# Patient Record
Sex: Male | Born: 1945 | Race: White | Hispanic: No | Marital: Married | Smoking: Never smoker
Health system: Southern US, Community
[De-identification: ages and names within clinical notes are randomized; demographics above are authoritative.]

## PROBLEM LIST (undated history)

## (undated) DIAGNOSIS — E785 Hyperlipidemia, unspecified: Secondary | ICD-10-CM

## (undated) DIAGNOSIS — E538 Deficiency of other specified B group vitamins: Secondary | ICD-10-CM

## (undated) DIAGNOSIS — H40009 Preglaucoma, unspecified, unspecified eye: Secondary | ICD-10-CM

## (undated) DIAGNOSIS — C719 Malignant neoplasm of brain, unspecified: Secondary | ICD-10-CM

## (undated) DIAGNOSIS — I10 Essential (primary) hypertension: Secondary | ICD-10-CM

## (undated) HISTORY — DX: Hyperlipidemia, unspecified: E78.5

## (undated) HISTORY — DX: Essential (primary) hypertension: I10

## (undated) HISTORY — DX: Deficiency of other specified B group vitamins: E53.8

## (undated) HISTORY — DX: Preglaucoma, unspecified, unspecified eye: H40.009

## (undated) HISTORY — PX: NO PAST SURGERIES: SHX2092

---

## 1998-08-20 ENCOUNTER — Encounter: Payer: Self-pay | Admitting: Internal Medicine

## 1998-08-20 ENCOUNTER — Inpatient Hospital Stay (HOSPITAL_COMMUNITY): Admission: EM | Admit: 1998-08-20 | Discharge: 1998-08-22 | Payer: Self-pay | Admitting: Internal Medicine

## 2003-06-06 ENCOUNTER — Encounter: Payer: Self-pay | Admitting: Internal Medicine

## 2003-12-14 ENCOUNTER — Encounter: Payer: Self-pay | Admitting: Internal Medicine

## 2004-08-22 ENCOUNTER — Ambulatory Visit: Payer: Self-pay | Admitting: Internal Medicine

## 2004-09-11 ENCOUNTER — Ambulatory Visit: Payer: Self-pay | Admitting: Internal Medicine

## 2005-05-29 ENCOUNTER — Ambulatory Visit: Payer: Self-pay | Admitting: Internal Medicine

## 2005-06-11 ENCOUNTER — Encounter: Admission: RE | Admit: 2005-06-11 | Discharge: 2005-06-11 | Payer: Self-pay | Admitting: Internal Medicine

## 2006-08-26 ENCOUNTER — Ambulatory Visit: Payer: Self-pay | Admitting: Internal Medicine

## 2006-08-26 LAB — CONVERTED CEMR LAB
Albumin: 4.1 g/dL (ref 3.5–5.2)
Basophils Absolute: 0 10*3/uL (ref 0.0–0.1)
CO2: 27 meq/L (ref 19–32)
Creatinine, Ser: 0.8 mg/dL (ref 0.4–1.5)
Creatinine,U: 116.3 mg/dL
GFR calc non Af Amer: 105 mL/min
Glucose, Bld: 147 mg/dL — ABNORMAL HIGH (ref 70–99)
HDL: 43.1 mg/dL (ref >39.0)
Hgb A1c MFr Bld: 6.1 % — ABNORMAL HIGH (ref 4.6–6.0)
MCHC: 32.6 g/dL (ref 30.0–36.0)
Microalb Creat Ratio: 5.2 mg/g (ref 0.0–30.0)
Microalb, Ur: 0.6 mg/dL (ref 0.0–1.9)
Monocytes Relative: 5.7 % (ref 3.0–11.0)
Neutro Abs: 4.4 10*3/uL (ref 1.4–7.7)
Neutrophils Relative %: 75.4 % (ref 43.0–77.0)
RBC: 5.14 M/uL (ref 4.22–5.81)
RDW: 13.5 % (ref 11.5–14.6)
Sodium: 140 meq/L (ref 135–145)
Total Bilirubin: 0.8 mg/dL (ref 0.3–1.2)
VLDL: 11 mg/dL (ref 0–40)

## 2006-09-11 ENCOUNTER — Ambulatory Visit: Payer: Self-pay | Admitting: Internal Medicine

## 2006-09-11 LAB — CONVERTED CEMR LAB: TSH: 0.23 microintl units/mL — ABNORMAL LOW (ref 0.35–5.50)

## 2006-09-24 ENCOUNTER — Encounter: Admission: RE | Admit: 2006-09-24 | Discharge: 2006-09-24 | Payer: Self-pay | Admitting: Internal Medicine

## 2006-09-24 ENCOUNTER — Encounter: Payer: Self-pay | Admitting: Internal Medicine

## 2006-09-25 ENCOUNTER — Encounter: Payer: Self-pay | Admitting: Internal Medicine

## 2006-10-02 DIAGNOSIS — E782 Mixed hyperlipidemia: Secondary | ICD-10-CM

## 2006-10-02 DIAGNOSIS — E119 Type 2 diabetes mellitus without complications: Secondary | ICD-10-CM | POA: Insufficient documentation

## 2006-10-02 DIAGNOSIS — I10 Essential (primary) hypertension: Secondary | ICD-10-CM | POA: Insufficient documentation

## 2006-10-02 DIAGNOSIS — E785 Hyperlipidemia, unspecified: Secondary | ICD-10-CM | POA: Insufficient documentation

## 2006-12-10 ENCOUNTER — Encounter: Payer: Self-pay | Admitting: Internal Medicine

## 2006-12-10 ENCOUNTER — Ambulatory Visit: Payer: Self-pay | Admitting: Internal Medicine

## 2006-12-10 LAB — CONVERTED CEMR LAB
Hgb A1c MFr Bld: 6.3 % — ABNORMAL HIGH (ref 4.6–6.0)
Microalb Creat Ratio: 10.5 mg/g (ref 0.0–30.0)
Microalb, Ur: 1.2 mg/dL (ref 0.0–1.9)
Potassium: 3.5 meq/L (ref 3.5–5.1)

## 2006-12-29 ENCOUNTER — Ambulatory Visit: Payer: Self-pay | Admitting: Internal Medicine

## 2007-05-13 ENCOUNTER — Ambulatory Visit: Payer: Self-pay | Admitting: Internal Medicine

## 2007-05-14 ENCOUNTER — Encounter (INDEPENDENT_AMBULATORY_CARE_PROVIDER_SITE_OTHER): Payer: Self-pay | Admitting: *Deleted

## 2007-05-14 LAB — CONVERTED CEMR LAB
AST: 19 units/L (ref 0–37)
Cholesterol: 190 mg/dL (ref 0–200)
LDL Cholesterol: 126 mg/dL — ABNORMAL HIGH (ref 0–99)
Microalb, Ur: 0.5 mg/dL (ref 0.0–1.9)
Potassium: 4.9 meq/L (ref 3.5–5.1)
Total CHOL/HDL Ratio: 4.4
Triglycerides: 103 mg/dL (ref 0–149)

## 2007-07-13 ENCOUNTER — Telehealth (INDEPENDENT_AMBULATORY_CARE_PROVIDER_SITE_OTHER): Payer: Self-pay | Admitting: *Deleted

## 2007-08-26 ENCOUNTER — Ambulatory Visit: Payer: Self-pay | Admitting: Internal Medicine

## 2007-08-26 DIAGNOSIS — N400 Enlarged prostate without lower urinary tract symptoms: Secondary | ICD-10-CM | POA: Insufficient documentation

## 2007-08-26 LAB — CONVERTED CEMR LAB
Cholesterol, target level: 200 mg/dL
HDL goal, serum: 40 mg/dL
LDL Goal: 100 mg/dL

## 2007-09-06 ENCOUNTER — Encounter (INDEPENDENT_AMBULATORY_CARE_PROVIDER_SITE_OTHER): Payer: Self-pay | Admitting: *Deleted

## 2007-09-06 LAB — CONVERTED CEMR LAB
Albumin: 4.2 g/dL (ref 3.5–5.2)
Alkaline Phosphatase: 55 units/L (ref 39–117)
BUN: 11 mg/dL (ref 6–23)
Basophils Absolute: 0 10*3/uL (ref 0.0–0.1)
Cholesterol: 180 mg/dL (ref 0–200)
Creatinine,U: 131.3 mg/dL
GFR calc Af Amer: 126 mL/min
HDL: 40.5 mg/dL (ref >39.0)
Hemoglobin: 15.5 g/dL (ref 13.0–17.0)
Lymphocytes Relative: 15.6 % (ref 12.0–46.0)
MCHC: 35.5 g/dL (ref 30.0–36.0)
MCV: 85.3 fL (ref 78.0–100.0)
Microalb Creat Ratio: 3.8 mg/g (ref 0.0–30.0)
Microalb, Ur: 0.5 mg/dL (ref 0.0–1.9)
Monocytes Absolute: 0.4 10*3/uL (ref 0.2–0.7)
Monocytes Relative: 5 % (ref 3.0–11.0)
Neutro Abs: 6.5 10*3/uL (ref 1.4–7.7)
Potassium: 4.2 meq/L (ref 3.5–5.1)
TSH: 1.22 microintl units/mL (ref 0.35–5.50)
Total Protein: 7.3 g/dL (ref 6.0–8.3)

## 2007-09-21 ENCOUNTER — Ambulatory Visit: Payer: Self-pay | Admitting: Internal Medicine

## 2007-09-21 ENCOUNTER — Encounter (INDEPENDENT_AMBULATORY_CARE_PROVIDER_SITE_OTHER): Payer: Self-pay | Admitting: *Deleted

## 2007-10-04 ENCOUNTER — Encounter: Payer: Self-pay | Admitting: Internal Medicine

## 2008-04-21 ENCOUNTER — Telehealth (INDEPENDENT_AMBULATORY_CARE_PROVIDER_SITE_OTHER): Payer: Self-pay | Admitting: *Deleted

## 2008-10-16 ENCOUNTER — Telehealth (INDEPENDENT_AMBULATORY_CARE_PROVIDER_SITE_OTHER): Payer: Self-pay | Admitting: *Deleted

## 2008-10-19 ENCOUNTER — Encounter: Payer: Self-pay | Admitting: Internal Medicine

## 2008-12-14 ENCOUNTER — Telehealth (INDEPENDENT_AMBULATORY_CARE_PROVIDER_SITE_OTHER): Payer: Self-pay | Admitting: *Deleted

## 2009-01-29 ENCOUNTER — Ambulatory Visit: Payer: Self-pay | Admitting: Internal Medicine

## 2009-01-29 DIAGNOSIS — E042 Nontoxic multinodular goiter: Secondary | ICD-10-CM

## 2009-01-29 DIAGNOSIS — D485 Neoplasm of uncertain behavior of skin: Secondary | ICD-10-CM | POA: Insufficient documentation

## 2009-02-03 LAB — CONVERTED CEMR LAB
ALT: 16 units/L (ref 0–53)
AST: 24 units/L (ref 0–37)
Albumin: 3.8 g/dL (ref 3.5–5.2)
Alkaline Phosphatase: 53 units/L (ref 39–117)
Basophils Relative: 0.7 % (ref 0.0–3.0)
Creatinine,U: 116.1 mg/dL
Eosinophils Relative: 1.6 % (ref 0.0–5.0)
GFR calc non Af Amer: 103.87 mL/min (ref >60)
Glucose, Bld: 161 mg/dL — ABNORMAL HIGH (ref 70–99)
HCT: 40 % (ref 39.0–52.0)
Hemoglobin: 13.8 g/dL (ref 13.0–17.0)
Hgb A1c MFr Bld: 7.2 % — ABNORMAL HIGH (ref 4.6–6.5)
Lymphocytes Relative: 19.2 % (ref 12.0–46.0)
Lymphs Abs: 1.2 10*3/uL (ref 0.7–4.0)
Microalb Creat Ratio: 5.2 mg/g (ref 0.0–30.0)
Microalb, Ur: 0.6 mg/dL (ref 0.0–1.9)
Monocytes Relative: 6.9 % (ref 3.0–12.0)
Neutro Abs: 4.3 10*3/uL (ref 1.4–7.7)
PSA: 1.04 ng/mL (ref 0.10–4.00)
Potassium: 4.4 meq/L (ref 3.5–5.1)
RBC: 4.58 M/uL (ref 4.22–5.81)
RDW: 13.6 % (ref 11.5–14.6)
Sodium: 140 meq/L (ref 135–145)
TSH: 1.2 microintl units/mL (ref 0.35–5.50)
Total CHOL/HDL Ratio: 5
Total Protein: 7.4 g/dL (ref 6.0–8.3)
VLDL: 34.8 mg/dL (ref 0.0–40.0)
WBC: 6 10*3/uL (ref 4.5–10.5)

## 2009-02-08 ENCOUNTER — Telehealth (INDEPENDENT_AMBULATORY_CARE_PROVIDER_SITE_OTHER): Payer: Self-pay | Admitting: *Deleted

## 2009-02-08 ENCOUNTER — Encounter (INDEPENDENT_AMBULATORY_CARE_PROVIDER_SITE_OTHER): Payer: Self-pay | Admitting: *Deleted

## 2009-07-16 ENCOUNTER — Telehealth (INDEPENDENT_AMBULATORY_CARE_PROVIDER_SITE_OTHER): Payer: Self-pay | Admitting: *Deleted

## 2009-10-01 ENCOUNTER — Telehealth: Payer: Self-pay | Admitting: Internal Medicine

## 2009-10-31 ENCOUNTER — Telehealth (INDEPENDENT_AMBULATORY_CARE_PROVIDER_SITE_OTHER): Payer: Self-pay | Admitting: *Deleted

## 2009-11-07 ENCOUNTER — Encounter: Payer: Self-pay | Admitting: Internal Medicine

## 2009-11-19 ENCOUNTER — Telehealth (INDEPENDENT_AMBULATORY_CARE_PROVIDER_SITE_OTHER): Payer: Self-pay | Admitting: *Deleted

## 2010-03-29 ENCOUNTER — Telehealth (INDEPENDENT_AMBULATORY_CARE_PROVIDER_SITE_OTHER): Payer: Self-pay | Admitting: *Deleted

## 2010-05-07 ENCOUNTER — Telehealth (INDEPENDENT_AMBULATORY_CARE_PROVIDER_SITE_OTHER): Payer: Self-pay | Admitting: *Deleted

## 2010-05-22 ENCOUNTER — Telehealth (INDEPENDENT_AMBULATORY_CARE_PROVIDER_SITE_OTHER): Payer: Self-pay | Admitting: *Deleted

## 2010-05-27 ENCOUNTER — Telehealth (INDEPENDENT_AMBULATORY_CARE_PROVIDER_SITE_OTHER): Payer: Self-pay | Admitting: *Deleted

## 2010-07-12 ENCOUNTER — Telehealth (INDEPENDENT_AMBULATORY_CARE_PROVIDER_SITE_OTHER): Payer: Self-pay | Admitting: *Deleted

## 2010-09-23 ENCOUNTER — Telehealth: Payer: Self-pay | Admitting: Internal Medicine

## 2010-09-28 ENCOUNTER — Encounter: Payer: Self-pay | Admitting: Internal Medicine

## 2010-10-01 ENCOUNTER — Other Ambulatory Visit: Payer: Self-pay | Admitting: Internal Medicine

## 2010-10-01 ENCOUNTER — Ambulatory Visit
Admission: RE | Admit: 2010-10-01 | Discharge: 2010-10-01 | Payer: Self-pay | Source: Home / Self Care | Attending: Internal Medicine | Admitting: Internal Medicine

## 2010-10-01 ENCOUNTER — Encounter: Payer: Self-pay | Admitting: Internal Medicine

## 2010-10-01 DIAGNOSIS — K029 Dental caries, unspecified: Secondary | ICD-10-CM | POA: Insufficient documentation

## 2010-10-01 DIAGNOSIS — R0989 Other specified symptoms and signs involving the circulatory and respiratory systems: Secondary | ICD-10-CM

## 2010-10-01 LAB — BASIC METABOLIC PANEL
BUN: 19 mg/dL (ref 6–23)
CO2: 28 mEq/L (ref 19–32)
Calcium: 9.2 mg/dL (ref 8.4–10.5)
Chloride: 105 mEq/L (ref 96–112)
Creatinine, Ser: 0.9 mg/dL (ref 0.4–1.5)
GFR: 93.79 mL/min (ref >60.00)
Glucose, Bld: 140 mg/dL — ABNORMAL HIGH (ref 70–99)
Potassium: 4.9 mEq/L (ref 3.5–5.1)
Sodium: 141 mEq/L (ref 135–145)

## 2010-10-01 LAB — TSH: TSH: 1.57 u[IU]/mL (ref 0.35–5.50)

## 2010-10-01 LAB — CBC WITH DIFFERENTIAL/PLATELET
Basophils Absolute: 0 10*3/uL (ref 0.0–0.1)
Basophils Relative: 0.4 % (ref 0.0–3.0)
Eosinophils Absolute: 0.1 10*3/uL (ref 0.0–0.7)
Eosinophils Relative: 0.7 % (ref 0.0–5.0)
HCT: 38.8 % — ABNORMAL LOW (ref 39.0–52.0)
Hemoglobin: 13.1 g/dL (ref 13.0–17.0)
Lymphocytes Relative: 17.3 % (ref 12.0–46.0)
Lymphs Abs: 1.4 10*3/uL (ref 0.7–4.0)
MCHC: 33.6 g/dL (ref 30.0–36.0)
MCV: 89.2 fl (ref 78.0–100.0)
Monocytes Absolute: 0.5 10*3/uL (ref 0.1–1.0)
Monocytes Relative: 6 % (ref 3.0–12.0)
Neutro Abs: 6.3 10*3/uL (ref 1.4–7.7)
Neutrophils Relative %: 75.6 % (ref 43.0–77.0)
Platelets: 192 10*3/uL (ref 150.0–400.0)
RBC: 4.35 Mil/uL (ref 4.22–5.81)
RDW: 15.2 % — ABNORMAL HIGH (ref 11.5–14.6)
WBC: 8.4 10*3/uL (ref 4.5–10.5)

## 2010-10-01 LAB — LIPID PANEL
Cholesterol: 186 mg/dL (ref 0–200)
HDL: 40.6 mg/dL (ref >39.00)
LDL Cholesterol: 113 mg/dL — ABNORMAL HIGH (ref 0–99)
Total CHOL/HDL Ratio: 5
Triglycerides: 163 mg/dL — ABNORMAL HIGH (ref 0.0–149.0)
VLDL: 32.6 mg/dL (ref 0.0–40.0)

## 2010-10-01 LAB — HEPATIC FUNCTION PANEL
AST: 18 U/L (ref 0–37)
Total Bilirubin: 0.6 mg/dL (ref 0.3–1.2)

## 2010-10-01 LAB — MICROALBUMIN / CREATININE URINE RATIO
Creatinine,U: 247 mg/dL
Microalb, Ur: 1.8 mg/dL (ref 0.0–1.9)

## 2010-10-01 LAB — PSA: PSA: 1.03 ng/mL (ref 0.10–4.00)

## 2010-10-04 ENCOUNTER — Telehealth (INDEPENDENT_AMBULATORY_CARE_PROVIDER_SITE_OTHER): Payer: Self-pay | Admitting: *Deleted

## 2010-10-08 NOTE — Progress Notes (Signed)
Summary: lisinopril refill   Phone Note Refill Request Message from:  Fax from Pharmacy on May 22, 2010 11:26 AM  Refills Requested: Medication #1:  LISINOPRIL 20 MG  TABS 1 by mouth two times a day  **APPOINTMENT DUE** [facility] - fax [phone]   Initial call taken by: Okey Regal [place],  May 22, 2010 11:27 AM    Prescriptions: LISINOPRIL 20 MG  TABS (LISINOPRIL) 1 by mouth two times a day  **APPOINTMENT DUE**  #60 x 0   Entered by:   Doristine Devoid CMA   Authorized by:   Marga Melnick MD   Signed by:   Doristine Devoid CMA on 05/23/2010   Method used:   Electronically to        [facility]* (retail)       [address]       [address] [place] [address]mailing       [place], [place]  [address]       Ph: [phone]       Fax: [phone]   RxID:   [id number]

## 2010-10-08 NOTE — Letter (Signed)
Summary: [facility]  [facility]   Imported By: Lanelle Bal 11/16/2009 08:51:50  _____________________________________________________________________  External Attachment:    Type:   Image     Comment:   External Document

## 2010-10-08 NOTE — Progress Notes (Signed)
Summary: med causing stroke, heat attack  Phone Note Call from Patient Call back at [phone]   Caller: Spouse Summary of Call: pt wife left Vm that she just saw on the news where actos is causing Heart attack and stroke. pt wife wants to know if pt needs to continue on this med or does dr hopper want to change this to another med dr hopper pls advise.................Marland KitchenFelecia Deloach CMA  October 01, 2009 3:39 PM   Follow-up for Phone Call        Avandia had been reported to have increased cardiovascular risks, but not Actos to my knowledge. Monitor A1c every 4 months to see if any meds can be decreased Follow-up by: Marga Melnick MD,  October 01, 2009 5:24 PM  Additional Follow-up for Phone Call Additional follow up Details #1::        pt is refusing to take med due to reports that were on TV.  per dr hopper pt needs to have labs repeated in 8 weeks after being off med...........Marland KitchenFelecia Deloach CMA  October 01, 2009 5:28 PM

## 2010-10-08 NOTE — Progress Notes (Signed)
Summary: Refill Request  Phone Note Refill Request Call back at [phone] Message from:  Patient  Refills Requested: Medication #1:  ACTOS 45 MG TABS 1 by mouth once daily Patient would like this RX for Mail Order, mail to [address]/ [place] [address]   Method Requested: Mail to Patient Initial call taken by: Shonna Chock,  November 19, 2009 10:13 AM  Follow-up for Phone Call        Patient's wife aware RX to be mailed Follow-up by: Shonna Chock,  November 19, 2009 10:15 AM    Prescriptions: ACTOS 45 MG TABS (PIOGLITAZONE HCL) 1 by mouth once daily  #90 x 1   Entered by:   Shonna Chock   Authorized by:   Marga Melnick MD   Signed by:   Shonna Chock on 11/19/2009   Method used:   Print then Mail to Patient   RxID:   [id number]

## 2010-10-08 NOTE — Progress Notes (Signed)
Summary: refill request  Phone Note Refill Request Call back at [phone] Message from:  Pharmacy on May 07, 2010 2:29 PM  Refills Requested: Medication #1:  LISINOPRIL 20 MG  TABS 1 by mouth two times a day patient needs office visit   Dosage confirmed as above?Dosage Confirmed   Supply Requested: 1 month   Last Refilled: 02/06/2010 [facility]  Next Appointment Scheduled: none Initial call taken by: Lavell Islam,  May 07, 2010 2:30 PM    New/Updated Medications: LISINOPRIL 20 MG  TABS (LISINOPRIL) 1 by mouth two times a day  **APPOINTMENT DUE** Prescriptions: LISINOPRIL 20 MG  TABS (LISINOPRIL) 1 by mouth two times a day  **APPOINTMENT DUE**  #60 x 0   Entered by:   Shonna Chock CMA   Authorized by:   Marga Melnick MD   Signed by:   Shonna Chock CMA on 05/07/2010   Method used:   Electronically to        [facility]* (retail)       [address]       [address] [place] [address]mailing       [place], [place]  [address]       Ph: [phone]       Fax: [phone]   RxID:   [id number]

## 2010-10-08 NOTE — Progress Notes (Signed)
Summary: refill  Phone Note Call from Patient Call back at [phone]   Caller: Patient Summary of Call: pt wife left VM that she is try to get mail order rx filled but is having difficulty doing so. pt only has a few pills  left of his actos. pt would like to get a 30-day supply sent in to [facility]......................Marland KitchenFelecia Deloach CMA  March 29, 2010 9:23 AM     Prescriptions: ACTOS 45 MG TABS (PIOGLITAZONE HCL) 1 by mouth once daily  #30 x 0   Entered by:   Shonna Chock CMA   Authorized by:   Marga Melnick MD   Signed by:   Shonna Chock CMA on 03/29/2010   Method used:   Electronically to        [facility]* (retail)       [address]       [address] [place] [address]mailing       [place], [place]  [address]       Ph: [phone]       Fax: [phone]   RxID:   [id number]

## 2010-10-08 NOTE — Progress Notes (Signed)
Summary: metoprolol refill   Phone Note Refill Request Message from:  Patient  Refills Requested: Medication #1:  METOPROLOL TARTRATE 50 MG  TABS take 1 tab bid Initial call taken by: Doristine Devoid CMA,  May 27, 2010 11:05 AM    Prescriptions: METOPROLOL TARTRATE 50 MG  TABS (METOPROLOL TARTRATE) take 1 tab bid  #60 x 0   Entered by:   Doristine Devoid CMA   Authorized by:   Marga Melnick MD   Signed by:   Doristine Devoid CMA on 05/27/2010   Method used:   Electronically to        [facility]* (retail)       [address]       [address] [place] [address]mailing       [place], [place]  [address]       Ph: [phone]       Fax: [phone]   RxID:   [id number]

## 2010-10-08 NOTE — Progress Notes (Signed)
Summary: Refill Request  Phone Note Refill Request Message from:  Patient  Refills Requested: Medication #1:  ACTOS 45 MG TABS 1 by mouth once daily  Medication #2:  METOPROLOL TARTRATE 50 MG  TABS take 1 tab bid  Medication #3:  CLONIDINE HCL 0.2 MG  TABS (CLONIDINE HCL) 1 by mouth two times a day needs follow up on blood pressure  Medication #4:  METFORMIN HCL 1000 MG TABS 1 by mouth bid [facility]   Method Requested: Fax to Local Pharmacy Initial call taken by: Shonna Chock,  October 31, 2009 11:21 AM    Prescriptions: ACTOS 45 MG TABS (PIOGLITAZONE HCL) 1 by mouth once daily  #90 x 1   Entered by:   Shonna Chock   Authorized by:   Marga Melnick MD   Signed by:   Shonna Chock on 10/31/2009   Method used:   Electronically to        [facility]* (retail)       [address]       [address] [place] [address]mailing       [place], [place]  [address]       Ph: [phone]       Fax: [phone]   RxID:   [id number] METFORMIN HCL 1000 MG TABS (METFORMIN HCL) 1 by mouth bid  #180 Each x 1   Entered by:   Shonna Chock   Authorized by:   Marga Melnick MD   Signed by:   Shonna Chock on 10/31/2009   Method used:   Electronically to        [facility]* (retail)       [address]       [address] Shipping/mailing       [place], [place]  [address]       Ph: [phone]       Fax: [phone]   RxID:   [id number] LISINOPRIL 20 MG  TABS (LISINOPRIL) 1 by mouth two times a day patient needs office visit  #180 Each x 1   Entered by:   Shonna Chock   Authorized by:   Marga Melnick MD   Signed by:   Shonna Chock on 10/31/2009   Method used:   Electronically to        [facility]* (retail)       [address]       [address] Shipping/mailing       [place], [place]  [address]       Ph: [phone]       Fax: [phone]   RxID:   [id number] CLONIDINE HCL 0.2 MG  TABS  (CLONIDINE HCL) 1 by mouth two times a day needs follow up on blood pressure  #180 x 1   Entered by:   Shonna Chock   Authorized by:   Marga Melnick MD   Signed by:   Shonna Chock on 10/31/2009   Method used:   Faxed to ...       [facility]* (retail)       [address]       [address] Shipping/mailing       [place], [place]  [address]       Ph: [phone]       Fax: [phone]   RxID:   [id number] METOPROLOL TARTRATE 50 MG  TABS (METOPROLOL TARTRATE) take 1 tab bid  #180  Each x 1   Entered by:   Shonna Chock   Authorized by:   Marga Melnick MD   Signed by:   Shonna Chock on 10/31/2009   Method used:   Electronically to        [facility]* (retail)       [address]       [address] [place] [address]mailing       [place], [place]  [address]       Ph: [phone]       Fax: [phone]   RxID:   [id number]

## 2010-10-08 NOTE — Progress Notes (Signed)
Summary: Med Concerns   Phone Note Call from Patient Call back at Home Phone [phone]   Caller: [facility] Summary of Call: Med concerns: Patient needs refill on Lisinopril, patient unable to get off for the next 60 days at a new Job   Shonna Chock CMA  July 12, 2010 1:08 PM   Follow-up for Phone Call        Spoke with patient's wife and scheduled appointment for Jan 2012 and informed her we will refill med to last until appointment  Follow-up by: Shonna Chock CMA,  July 12, 2010 1:21 PM    Prescriptions: LISINOPRIL 20 MG  TABS (LISINOPRIL) 1 by mouth two times a day  **APPOINTMENT DUE**  #60 x 2   Entered by:   Shonna Chock CMA   Authorized by:   Marga Melnick MD   Signed by:   Shonna Chock CMA on 07/12/2010   Method used:   Electronically to        [facility]* (retail)       [address]       [address] [place] [address]mailing       [place], [place]  [address]       Ph: [phone]       Fax: [phone]   RxID:   [id number]   Appended Document: Med Concerns     Prescriptions: METFORMIN HCL 1000 MG TABS (METFORMIN HCL) 1 by mouth two times a day **APPOINTMENT OVERDUE**  #60 x 2   Entered by:   Shonna Chock CMA   Authorized by:   Marga Melnick MD   Signed by:   Shonna Chock CMA on 07/12/2010   Method used:   Electronically to        [facility]* (retail)       [address]       [address] [place] [address]mailing       [place], [place]  [address]       Ph: [phone]       Fax: [phone]   RxID:   [id number]

## 2010-10-10 NOTE — Progress Notes (Signed)
Summary: Refill  Phone Note Refill Request Call back at [phone] Message from:  [facility] on September 23, 2010 11:45 AM  Refills Requested: Medication #1:  CLONIDINE HCL 0.2 MG TABS 1 by mouth two times a day needs follow up on blood pressure Patient has upcomin appt and will run out prior. Please send to [facility] out patient pharmacy. How many would you like to give the patient?  Next Appointment Scheduled: 1.24.12 Initial call taken by: Lucious Groves CMA,  September 23, 2010 11:45 AM  Follow-up for Phone Call        refill  to get him through  OV ; the Rx may have to be changed based on status @  that visit Follow-up by: Marga Melnick MD,  September 23, 2010 2:19 PM  Additional Follow-up for Phone Call Additional follow up Details #1::        Patient spouse notified. Additional Follow-up by: Lucious Groves CMA,  September 23, 2010 3:38 PM    Prescriptions: CLONIDINE HCL 0.2 MG TABS (CLONIDINE HCL) 1 by mouth two times a day needs follow up on blood pressure  #16 x 0   Entered by:   Lucious Groves CMA   Authorized by:   Marga Melnick MD   Signed by:   Lucious Groves CMA on 09/23/2010   Method used:   Electronically to        [facility]* (retail)       [address]       [address] [place] [address]mailing       [place], [place]  [address]       Ph: [phone]       Fax: [phone]   RxID:   [id number]

## 2010-10-10 NOTE — Assessment & Plan Note (Signed)
Summary: CPX with fasting/scm   Vital Signs:  Patient profile:   65 year old male Height:      72 inches Weight:      224.4 pounds BMI:     30.54 Temp:     97.9 degrees F oral Pulse rate:   72 / minute Resp:     14 per minute BP sitting:   130 / 88  (left arm) Cuff size:   large  Vitals Entered By: Shonna Chock CMA (October 01, 2010 8:26 AM) CC: 1.) CPX with fasting labs  2.) Discuss Metoprolol, Type 2 diabetes mellitus follow-up   CC:  1.) CPX with fasting labs  2.) Discuss Metoprolol and Type 2 diabetes mellitus follow-up.  History of Present Illness:    [facility] is here for a physical; he feels Metoprolol makes him sleepy.  The patient denies lightheadedness, urinary frequency, headaches, edema, and fatigue.  The patient denies the following associated symptoms: chest pain, chest pressure, exercise intolerance, dyspnea, palpitations, and syncope.  Compliance with medications (by patient report) has been near 100%.  The patient reports that dietary compliance has been  fair to good.  The patient reports exercising occasionally.  Adjunctive measures currently used by the patient include salt restriction.  BP @ home 120-150/ 86-89.    Type 2 Diabetes Mellitus Follow-Up:  The patient reports weight gain of 10#, but denies polyuria, polydipsia, blurred vision, self managed hypoglycemia, and numbness of extremities.  The patient denies the following symptoms: neuropathic pain, poor wound healing, intermittent claudication, vision loss, and foot ulcer.  The patient has been measuring capillary blood glucose before breakfast,( 140-180s) and before dinner( 120-140).  Since the last visit, the patient reports having had eye care by an ophthalmologist.    Current Medications (verified): 1)  Metoprolol Tartrate 50 Mg  Tabs (Metoprolol Tartrate) .... Take 1 Tab Two Times A Day **appointment Due** 2)  Clonidine Hcl 0.2 Mg Tabs (Clonidine Hcl) .Marland Kitchen.. 1 By Mouth Two Times A Day Needs Follow Up On Blood  Pressure 3)  Lisinopril 20 Mg  Tabs (Lisinopril) .Marland Kitchen.. 1 By Mouth Two Times A Day  **appointment Due** 4)  Metformin Hcl 1000 Mg Tabs (Metformin Hcl) .Marland Kitchen.. 1 By Mouth Two Times A Day **appointment Overdue** 5)  Onetouch Ultra Test   Strp (Glucose Blood) .Marland Kitchen.. 1 Strip Daily As Directed 6)  Asa 81mg  .... Once Daily 7)  Muscadine Grape Seed .... Bid 8)  Multivitamin .Marland Kitchen.. 1 By Mouth Qd 9)  Calcium 1000mg  With Vit D 400 Iu .... Qd 10)  Fish Oil 1000mg  .... Bid 11)  Actos 45 Mg Tabs (Pioglitazone Hcl) .Marland Kitchen.. 1 By Mouth Once Daily 12)  Vitamin C 500 Mg Tabs (Ascorbic Acid) .Marland Kitchen.. 1 By Mouth Once Daily 13)  Vitamin D 2000 Unit Tabs (Cholecalciferol) .Marland Kitchen.. 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 as per [place] & thyroid scan  Past Surgical History: Colitis/dehydration 1999 Denies surgical history Colonoscopy : none, "I just don't want one; you ask me that every year" ( SOC reviewed)  Family History: maternal uncle: diabetes brother: MI died age 79 mother: cancer?primary  Social History: Never Smoked Alcohol use-no Occupation: Technical sales engineer, part time Married Regular exercise-no  Review of Systems  The patient denies anorexia, fever, hoarseness, prolonged cough, hemoptysis, abdominal pain, melena, hematochezia,  severe indigestion/heartburn, hematuria, suspicious skin lesions, depression, unusual weight change, abnormal bleeding, enlarged lymph nodes, and angioedema.    Physical Exam  General:  well-nourished; alert,appropriate and cooperative throughout examination Head:  Normocephalic and atraumatic without obvious abnormalities. No apparent alopecia  Eyes:  No corneal or conjunctival inflammation noted.Perrla. Funduscopic exam benign,  without hemorrhages, exudates or papilledema.  Ears:  External ear exam shows no significant lesions or deformities.  Otoscopic examination reveals clear canals, tympanic membranes are intact bilaterally without bulging, retraction, inflammation or discharge. Hearing is grossly normal bilaterally. Nose:  External nasal examination shows no deformity or inflammation. Nasal mucosa are pink and moist without lesions or exudates. Mouth:  Oral mucosa and oropharynx without lesions or exudates.  Teeth  with clical caries Neck:  No deformities, masses, or tenderness noted. Prominent hyoid Lungs:  Normal respiratory effort, chest expands symmetrically. Lungs are clear to auscultation, no crackles or wheezes. Heart:  Normal rate and regular rhythm. S1 and S2 normal without gallop, murmur, click, rub .S4 Abdomen:  Bowel sounds positive,abdomen soft and non-tender without masses, organomegaly or hernias noted. Rectal:  No external abnormalities noted. Normal sphincter tone. No rectal masses or tenderness. Genitalia:  Testes bilaterally descended without nodularity, tenderness or masses. No scrotal masses or lesions. No penis lesions or urethral discharge. Prostate:  Prostate gland firm and smooth, ULN w/o  nodularity, tenderness, mass, asymmetry or induration. Msk:  No deformity or scoliosis noted of thoracic or lumbar spine.   Pulses:  R and L carotid,radial pulses are full and equal bilaterally. Decreased pedal pulses. Faint R carotid bruit Extremities:  No clubbing, cyanosis, edema. Fungal toe nail changes Neurologic:  alert & oriented X3, sensation intact to light touch, and DTRs symmetrical and normal.   Skin:  Intact without suspicious lesions or rashes. Myriad tiny cherry angiomas Cervical Nodes:  No lymphadenopathy noted Axillary Nodes:  No palpable lymphadenopathy Inguinal Nodes:  No significant adenopathy Psych:  memory intact for recent and remote, normally interactive, and good eye contact.      Impression & Recommendations:  Problem # 1:  ROUTINE GENERAL MEDICAL EXAM@HEALTH  CARE FACL (ICD-V70.0)  Orders: EKG w/ Interpretation (93000) Venipuncture (04540) TLB-Lipid Panel (80061-LIPID) TLB-BMP (Basic Metabolic Panel-BMET) (80048-METABOL) TLB-CBC Platelet - w/Differential (85025-CBCD) TLB-Hepatic/Liver Function Pnl (80076-HEPATIC) TLB-TSH (Thyroid Stimulating Hormone) (84443-TSH) TLB-PSA (Prostate Specific Antigen) (84153-PSA) TLB-A1C / Hgb A1C (Glycohemoglobin) (83036-A1C) TLB-Microalbumin/Creat Ratio, Urine (82043-MALB)  Problem # 2:  DIABETES MELLITUS, TYPE II (ICD-250.00)  His updated medication list for this problem includes:    Lisinopril 20 Mg Tabs (Lisinopril) .Marland Kitchen... 1 by mouth two times a day  **appointment due**    Metformin Hcl 1000 Mg Tabs (Metformin hcl) .Marland Kitchen... 1 by mouth two times a day **appointment overdue**    Actos 45 Mg Tabs (Pioglitazone hcl) .Marland Kitchen... 1 by mouth once daily  Problem # 3:  HYPERLIPIDEMIA (ICD-272.4)  Problem # 4:  HYPERTENSION (ICD-401.9)  The following medications were removed from the medication list:    Metoprolol Tartrate 50 Mg Tabs (Metoprolol tartrate) .Marland Kitchen... Take 1 tab two times a day **appointment due** His updated medication list for this problem includes:    Clonidine Hcl 0.2 Mg Tabs (Clonidine hcl) .Marland Kitchen... 1 by mouth two times a day needs follow up on blood pressure    Lisinopril 20 Mg Tabs (Lisinopril) .Marland Kitchen... 1 by mouth two times a day  **appointment due**    Carvedilol 12.5 Mg Tabs (Carvedilol) .Marland Kitchen... 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 1 by mouth two times a day needs follow up on blood pressure 2)  Lisinopril 20 Mg Tabs (Lisinopril) .Marland Kitchen.. 1 by mouth two times a day   **appointment due** 3)  Metformin Hcl 1000 Mg Tabs (Metformin hcl) .Marland Kitchen.. 1 by mouth two times a day **appointment overdue** 4)  Onetouch Ultra Test Strp (Glucose blood) .Marland Kitchen.. 1 strip daily as directed 5)  Asa 81mg   .... Once daily 6)  Muscadine Grape Seed  .... Bid 7)  Multivitamin  .Marland Kitchen.. 1 by mouth qd 8)  Calcium 1000mg  With Vit D 400 Iu  .... Qd 9)  Fish Oil 1000mg   .... Bid 10)  Actos 45 Mg Tabs (Pioglitazone hcl) .Marland Kitchen.. 1 by mouth once daily 11)  Vitamin C 500 Mg Tabs (Ascorbic acid) .Marland Kitchen.. 1 by mouth once daily 12)  Vitamin D 2000 Unit Tabs (Cholecalciferol) .Marland Kitchen.. 1 by mouth once daily 13)  Carvedilol 12.5 Mg Tabs (Carvedilol) .Marland Kitchen.. 1 two times a day  Patient Instructions: 1)  It is important that you exercise regularly at least 20 minutes 5 times a week. If you develop chest pain, have severe difficulty breathing, or feel very tired , stop exercising immediately and seek medical attention. 2)   Consider  a colonoscopy  to help detect colon cancer. 3)  Take an 81 mg coated Aspirin every day. 4)  Check your blood sugars regularly. If your readings are usually above :150  or below 90 you should contact our office. 5)  See your eye doctor yearly to check for diabetic eye damage. 6)  Check your feet each night for sore areas, calluses or signs of infection.Consider Podiatry referral. Prescriptions: CARVEDILOL 12.5 MG TABS (CARVEDILOL) 1 two times a day  #60 x 2   Entered and Authorized by:   Marga Melnick MD   Signed by:   Marga Melnick MD on 10/01/2010   Method used:   Electronically to        [facility]* (retail)       [address]       [address] Shipping/mailing       [place], [place]  [address]       Ph: [phone]       Fax: [phone]   RxID:   [id number]    Orders Added: 1)  Est. Patient 40-64 years [99396] 2)  EKG w/ Interpretation [93000] 3)  Venipuncture [36415] 4)  TLB-Lipid Panel [80061-LIPID] 5)  TLB-BMP (Basic Metabolic Panel-BMET)  [80048-METABOL] 6)  TLB-CBC Platelet - w/Differential [85025-CBCD] 7)  TLB-Hepatic/Liver Function Pnl [80076-HEPATIC] 8)  TLB-TSH (Thyroid Stimulating Hormone) [84443-TSH] 9)  TLB-PSA (Prostate Specific Antigen) [84153-PSA] 10)  TLB-A1C / Hgb A1C (Glycohemoglobin) [83036-A1C] 11)  TLB-Microalbumin/Creat Ratio, Urine [82043-MALB] 12)  Doppler Referral [Doppler]

## 2010-10-10 NOTE — Progress Notes (Signed)
Summary: Results/rx  Phone Note Call from Patient   Caller: Fredonia Highland  [phone] Summary of Call: Patient spouse called noting that patient was waiting on results and would like to know if he should continue Clonidine. If so prescription needs to be sent to [facility] out patient pharmacy. Please advise. Lucious Groves CMA  October 04, 2010 9:35 AM   Follow-up for Phone Call        done ; Actos changed to Januvia. Anemia will need   F/U in 10 weeks; labs wil be mailed Follow-up by: Marga Melnick MD,  October 04, 2010 4:36 PM  Additional Follow-up for Phone Call Additional follow up Details #1::        Spoke with patient's wife reguarding labs, patient's wife ok'd and aware copy to be mailed. Patient to call once he starts [place] to schedule 10 week lab f/u Additional Follow-up by: Shonna Chock CMA,  October 04, 2010 4:49 PM    New/Updated Medications: CLONIDINE HCL 0.2 MG TABS (CLONIDINE HCL) 1 by mouth two times a day JANUVIA 100 MG TABS (SITAGLIPTIN PHOSPHATE) 1 once daily (in place of Actos) Prescriptions: CLONIDINE HCL 0.2 MG TABS (CLONIDINE HCL) 1 by mouth two times a day  #180 x 3   Entered and Authorized by:   Marga Melnick MD   Signed by:   Marga Melnick MD on 10/04/2010   Method used:   Electronically to        [facility]* (retail)       [address]       [address] Shipping/mailing       [place], [place]  [address]       Ph: [phone]       Fax: [phone]   RxID:   [id number] JANUVIA 100 MG TABS (SITAGLIPTIN PHOSPHATE) 1 once daily (in place of Actos)  #90 x 0   Entered and Authorized by:   Marga Melnick MD   Signed by:   Marga Melnick MD on 10/04/2010   Method used:   Electronically to        [facility]* (retail)       [address]       [address] [place] [address]mailing       [place], [place]  [address]       Ph: [phone]       Fax: [phone]   RxID:   [id number]

## 2010-10-11 ENCOUNTER — Telehealth: Payer: Self-pay | Admitting: Internal Medicine

## 2010-10-14 ENCOUNTER — Telehealth: Payer: Self-pay | Admitting: Internal Medicine

## 2010-10-15 ENCOUNTER — Ambulatory Visit
Admission: RE | Admit: 2010-10-15 | Discharge: 2010-10-15 | Disposition: A | Discharge status: Home / Self Care | Payer: PRIVATE HEALTH INSURANCE | Source: Ambulatory Visit | Attending: Internal Medicine | Admitting: Internal Medicine

## 2010-10-15 DIAGNOSIS — R0989 Other specified symptoms and signs involving the circulatory and respiratory systems: Secondary | ICD-10-CM

## 2010-10-16 NOTE — Progress Notes (Signed)
Summary: Lisinopril  Phone Note Call from Patient   CallerFredonia Highland  [phone] Summary of Call: Patient spouse left message on triage wondering if the patient is to continue Lisinopril. If so he needs prescription sent to [facility] out patient pharmacy. Please advise. Initial call taken by: Lucious Groves CMA,  October 11, 2010 1:09 PM  Follow-up for Phone Call        refill X 12 months Follow-up by: Marga Melnick MD,  October 11, 2010 1:22 PM  Additional Follow-up for Phone Call Additional follow up Details #1::        Left message on voicemail notifying patient spouse. Lucious Groves CMA  October 11, 2010 3:00 PM     New/Updated Medications: LISINOPRIL 20 MG  TABS (LISINOPRIL) 1 by mouth two times a day Prescriptions: LISINOPRIL 20 MG  TABS (LISINOPRIL) 1 by mouth two times a day  #60 x 11   Entered by:   Lucious Groves CMA   Authorized by:   Marga Melnick MD   Signed by:   Lucious Groves CMA on 10/11/2010   Method used:   Electronically to        [facility]* (retail)       [address]       [address] [place] [address]/mailing       [place], [place]  [address]       Ph: [phone]       Fax: [phone]   RxID:   [id number]

## 2010-10-24 NOTE — Progress Notes (Signed)
Summary: Refill--Metformin  Phone Note Refill Request Message from:  Patient on October 14, 2010 9:20 AM  Refills Requested: Medication #1:  METFORMIN HCL 1000 MG TABS 1 by mouth two times a day **APPOINTMENT OVERDUE** Initial call taken by: Lucious Groves CMA,  October 14, 2010 9:20 AM    New/Updated Medications: METFORMIN HCL 1000 MG TABS (METFORMIN HCL) 1 by mouth two times a day Prescriptions: METFORMIN HCL 1000 MG TABS (METFORMIN HCL) 1 by mouth two times a day  #60 x 3   Entered by:   Lucious Groves CMA   Authorized by:   Marga Melnick MD   Signed by:   Lucious Groves CMA on 10/14/2010   Method used:   Electronically to        [facility]* (retail)       [address]       [address] [place] [address]mailing       [place], [place]  [address]       Ph: [phone]       Fax: [phone]   RxID:   [id number]

## 2010-11-14 ENCOUNTER — Encounter: Payer: Self-pay | Admitting: Internal Medicine

## 2010-11-26 NOTE — Letter (Signed)
Summary: Eye Exam/[facility]  Eye Exam/[facility]   Imported By: Maryln Gottron 11/21/2010 10:01:41  _____________________________________________________________________  External Attachment:    Type:   Image     Comment:   External Document

## 2010-12-30 ENCOUNTER — Other Ambulatory Visit: Payer: Self-pay | Admitting: Internal Medicine

## 2011-01-09 ENCOUNTER — Other Ambulatory Visit: Payer: Self-pay | Admitting: Internal Medicine

## 2011-01-09 NOTE — Telephone Encounter (Signed)
please recheck CBC with iron panel,B12, & folate (  285.9) in 3 months with repeat A1c , BUN , creat, K+, Lipids ( 250.02, 272.4,995.20). Complete stool cards please. Hopp. Copied from 10/01/2010 lab append

## 2011-02-05 ENCOUNTER — Other Ambulatory Visit: Payer: Self-pay | Admitting: Internal Medicine

## 2011-02-05 MED ORDER — SITAGLIPTIN PHOSPHATE 100 MG PO TABS: 100.0000 mg | ORAL_TABLET | Freq: Every day | ORAL | Status: DC

## 2011-02-05 MED ORDER — METFORMIN HCL 1000 MG PO TABS: 1000.0000 mg | ORAL_TABLET | Freq: Two times a day (BID) | ORAL | Status: DC

## 2011-02-05 NOTE — Telephone Encounter (Signed)
Patient is due for labs, please call patient to scheduled labs were due 12/2010  please recheck CBC with iron panel,B12, & folate (  285.9) in 3 months with repeat A1c , BUN , creat, K+, Lipids ( 250.02, 272.4,995.20). Complete stool cards please. Hopp.  OK to sign encounter once appointment scheduled

## 2011-02-07 NOTE — Telephone Encounter (Signed)
LMOM to call and schedule lab appt

## 2011-03-04 ENCOUNTER — Other Ambulatory Visit: Payer: Self-pay | Admitting: Internal Medicine

## 2011-03-04 DIAGNOSIS — T887XXA Unspecified adverse effect of drug or medicament, initial encounter: Secondary | ICD-10-CM

## 2011-03-04 DIAGNOSIS — D649 Anemia, unspecified: Secondary | ICD-10-CM

## 2011-03-04 DIAGNOSIS — E785 Hyperlipidemia, unspecified: Secondary | ICD-10-CM

## 2011-03-05 ENCOUNTER — Other Ambulatory Visit (INDEPENDENT_AMBULATORY_CARE_PROVIDER_SITE_OTHER): Payer: PRIVATE HEALTH INSURANCE

## 2011-03-05 DIAGNOSIS — E785 Hyperlipidemia, unspecified: Secondary | ICD-10-CM

## 2011-03-05 DIAGNOSIS — D649 Anemia, unspecified: Secondary | ICD-10-CM

## 2011-03-05 DIAGNOSIS — T887XXA Unspecified adverse effect of drug or medicament, initial encounter: Secondary | ICD-10-CM

## 2011-03-05 LAB — CBC WITH DIFFERENTIAL/PLATELET
Basophils Absolute: 0 10*3/uL (ref 0.0–0.1)
Lymphocytes Relative: 19 % (ref 12.0–46.0)
Monocytes Relative: 5.6 % (ref 3.0–12.0)
Platelets: 179 10*3/uL (ref 150.0–400.0)
RDW: 14.4 % (ref 11.5–14.6)

## 2011-03-05 LAB — LIPID PANEL
Cholesterol: 172 mg/dL (ref 0–200)
LDL Cholesterol: 117 mg/dL — ABNORMAL HIGH (ref 0–99)
Triglycerides: 91 mg/dL (ref 0.0–149.0)
VLDL: 18.2 mg/dL (ref 0.0–40.0)

## 2011-03-05 LAB — BUN: BUN: 18 mg/dL (ref 6–23)

## 2011-03-05 LAB — IRON AND TIBC
%SAT: 24 % (ref 20–55)
Iron: 78 ug/dL (ref 42–165)
TIBC: 331 ug/dL (ref 215–435)
UIBC: 253 ug/dL

## 2011-03-05 NOTE — Telephone Encounter (Signed)
Lab appt for 6/27

## 2011-03-05 NOTE — Progress Notes (Signed)
Labs only

## 2011-03-13 ENCOUNTER — Other Ambulatory Visit: Payer: Self-pay | Admitting: Internal Medicine

## 2011-03-13 MED ORDER — METFORMIN HCL 1000 MG PO TABS: 1000.0000 mg | ORAL_TABLET | Freq: Two times a day (BID) | ORAL | Status: DC

## 2011-03-13 MED ORDER — SITAGLIPTIN PHOSPHATE 100 MG PO TABS: 100.0000 mg | ORAL_TABLET | Freq: Every day | ORAL | Status: DC

## 2011-03-13 NOTE — Telephone Encounter (Signed)
RX's sent to the pharmacy, pending appointment this month 03/2011 (additional refills to be given then)

## 2011-03-22 ENCOUNTER — Encounter: Payer: Self-pay | Admitting: Internal Medicine

## 2011-03-27 ENCOUNTER — Ambulatory Visit (INDEPENDENT_AMBULATORY_CARE_PROVIDER_SITE_OTHER): Payer: PRIVATE HEALTH INSURANCE | Admitting: Internal Medicine

## 2011-03-27 ENCOUNTER — Encounter: Payer: Self-pay | Admitting: Internal Medicine

## 2011-03-27 VITALS — BP 128/84 | HR 83 | Wt 218.2 lb

## 2011-03-27 DIAGNOSIS — I10 Essential (primary) hypertension: Secondary | ICD-10-CM

## 2011-03-27 DIAGNOSIS — E785 Hyperlipidemia, unspecified: Secondary | ICD-10-CM

## 2011-03-27 DIAGNOSIS — D518 Other vitamin B12 deficiency anemias: Secondary | ICD-10-CM

## 2011-03-27 DIAGNOSIS — D519 Vitamin B12 deficiency anemia, unspecified: Secondary | ICD-10-CM

## 2011-03-27 DIAGNOSIS — E119 Type 2 diabetes mellitus without complications: Secondary | ICD-10-CM

## 2011-03-27 MED ORDER — CYANOCOBALAMIN 1000 MCG/ML IJ SOLN
1000.0000 ug | Freq: Once | INTRAMUSCULAR | Status: AC
  Administered 2011-03-27: 1000 ug via INTRAMUSCULAR

## 2011-03-27 MED ORDER — CYANOCOBALAMIN 500 MCG/0.1ML NA SOLN: 500.0000 ug | NASAL | Status: DC

## 2011-03-27 NOTE — Patient Instructions (Addendum)
Follow the low carb nutrition program in [employer] [place] [employer] [place] as closely as possible to prevent Diabetes progression & complications. White carbohydrates (potatoes, rice, bread, and pasta) have a high spike of sugar and a high load of sugar. For example a  baked potato has a cup of sugar and a  french fry  2 teaspoons of sugar. Yams, wild  rice, whole grained bread &  wheat pasta have been much lower spike and load of  sugar.  Please  schedule fasting Labs : Lipids; A1c urine microalbumin. Preventive Health Care: Exercise at least 30-45 minutes a day,  3-4 days a week.  Eye Doctor - have an eye exam @ least annually.                                                      Consider Podiatry referral

## 2011-03-27 NOTE — Progress Notes (Signed)
  Subjective:    Patient ID: Alex Church, male    DOB: 17-Oct-1945, 65 y.o.   MRN: [id number]  HPI#1  HYPERTENSION: Disease Monitoring: Blood pressure range-130s/ 80  Chest pain, palpitations- no       Dyspnea- no Medications: Compliance- yes  Lightheadedness,Syncope- no    Edema- no  #2 DIABETES: Disease Monitoring: Blood Sugar ranges-150s in am  Polyuria/phagia/dipsia- no       Visual problems- no Medications: Compliance- yes  Hypoglycemic symptoms- no Nutrition: no program Eye exam is up to date; he has had no care by podiatrist   #3 HYPERLIPIDEMIA: Disease Monitoring: See symptoms for Hypertension Medications: Compliance:Not on statin  Abd pain, bowel changes: occasional loose stool  Muscle aches:no  ROS See HPI above   PMH Smoking Status noted ;never  Current labs and labs dating back to December 2008 were reviewed. His LDL has been less than 120 and stable. His A1c has climbed from a value of 6.14 August 2007 to 7.3.  His B12 level is low at 162 there is no anemia and MCV is normal       Review of Systems     Objective:   Physical Exam Gen.: Healthy and well-nourished in appearance. Alert, appropriate and cooperative throughout exam. Eyes: No corneal or conjunctival inflammation noted.  Neck: No deformities, masses, or tenderness noted. Thyroid normal. Lungs: Normal respiratory effort; chest expands symmetrically. Lungs are clear to auscultation without rales, wheezes, or increased work of breathing. Heart: Normal rate and rhythm. Normal S1 and S2. No gallop, click, or rub. S4 w/o  murmur.                                                                                    Musculoskeletal/extremities: No clubbing, cyanosis, edema, or deformity noted. Joints normal. Nail health fair; thickening & discoloration Vascular: Carotid, radial artery, dorsalis pedis and  posterior tibial pulses are full and equal. No bruits present. Neurologic: Alert and  oriented x3. Deep tendon reflexes symmetrical and normal. Light touch normal over feet.          Skin: Intact without suspicious lesions or rashes. Psych: Mood and affect are normal. Normally interactive                                                                                         Assessment & Plan:  #1 diabetes, suboptimal control  #2 dyslipidemia, mild elevation of the LDL  #3 hypertension, controlled  #4 low B12 level without anemia or elevation of the MCV  Plan: Referral will be made to nutritionist  rather than increasing diabetic medications  Nascobal l will be initiated with followup B12 level in 6 months.

## 2011-04-02 ENCOUNTER — Telehealth: Payer: Self-pay | Admitting: Internal Medicine

## 2011-04-02 NOTE — Telephone Encounter (Signed)
Pt wife called to get lab results would like to know what could be causing low b12 levels and wants to know should he continue w/ the shots or start the nascobal

## 2011-04-02 NOTE — Telephone Encounter (Signed)
Left msg on voicemail to machine

## 2011-04-02 NOTE — Telephone Encounter (Signed)
Probably the best option would be to continue shots  Or Nascobal  ( if that agent is  not cost prohibitive)& recheck B12 level in 4 months in spite of the last test result. B12 level should be more reliable than the other test

## 2011-04-03 NOTE — Telephone Encounter (Signed)
Spoke w/ pt aware of information.

## 2011-04-14 ENCOUNTER — Other Ambulatory Visit: Payer: Self-pay | Admitting: Internal Medicine

## 2011-04-15 MED ORDER — SITAGLIPTIN PHOSPHATE 100 MG PO TABS: 100.0000 mg | ORAL_TABLET | Freq: Every day | ORAL | Status: DC

## 2011-04-15 MED ORDER — METFORMIN HCL 1000 MG PO TABS: 1000.0000 mg | ORAL_TABLET | Freq: Two times a day (BID) | ORAL | Status: DC

## 2011-04-15 NOTE — Telephone Encounter (Signed)
RXs sent to the pharmacy.

## 2011-04-24 ENCOUNTER — Encounter: Payer: Self-pay | Admitting: *Deleted

## 2011-04-24 ENCOUNTER — Encounter: Payer: PRIVATE HEALTH INSURANCE | Attending: Internal Medicine | Admitting: *Deleted

## 2011-04-24 DIAGNOSIS — Z713 Dietary counseling and surveillance: Secondary | ICD-10-CM | POA: Insufficient documentation

## 2011-04-24 DIAGNOSIS — E119 Type 2 diabetes mellitus without complications: Secondary | ICD-10-CM | POA: Insufficient documentation

## 2011-04-24 NOTE — Patient Instructions (Addendum)
Goals:  Follow Diabetes Meal Plan as instructed (yellow card).  Eat 3 meals and 2 snacks, every 3-5 hrs.  Limit carbohydrate intake to 45-60 grams per meal and 15 grams per snack.  Add lean protein foods to all meals and snacks  Monitor glucose levels as instructed by your doctor; bring glucose log to your next visit  Exercise at least 30-45 minutes/day, 3-4 days a week

## 2011-04-24 NOTE — Progress Notes (Signed)
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 Pt's A1c recently trending upwards; 7.1% (09/2010) and 7.3% (03/05/11).  Per wife, has not checked BG in a long time because "he doesn't want to know".  Pt works p/t for [employer] and drives around in car all day snacking on chips, nabs, and oatmeal creme cookies. Drinks caffeine-free diet sodas/drinks and reports eating only 2 meals daily.  Pt and wife eat out 5-6x/week and no exercise noted outside of ADLs. Pt does not appear to be as overly concerned with his DM as his wife.  MEDICATIONS:  aspirin 81 MG tablet  Calcium Carbonate-Vitamin D (CALCIUM + D PO)  carvedilol (COREG) 12.5 MG tablet  Cholecalciferol (VITAMIN D) 2000 UNITS CAPS  cloNIDine (CATAPRES) 0.2 MG tablet  Cyanocobalamin (NASCOBAL) 500 MCG/0.1ML SOLN  glucose blood (ONE TOUCH ULTRA TEST) test strip  lisinopril (PRINIVIL,ZESTRIL) 20 MG tablet  metFORMIN (GLUCOPHAGE) 1000 MG tablet  Multiple Vitamin (MULTIVITAMIN) tablet  Omega-3 Fatty Acids (FISH OIL) 1000 MG CAPS  sitaGLIPtin (JANUVIA) 100 MG tablet    DIETARY INTAKE:  Usual eating pattern includes 2 meals and multiple snacks per day.  24-hr recall:  B ( AM): SKIPS or pk of nabs  Snk ( AM): Sandwich, leftovers L ( PM): SKIPS Snk ( PM): chips, popcorn, oatmeal cookie w/ cream filling D ( PM): Restaurant Snk ( PM): unable to obtain  Usual physical activity: None  Estimated energy needs: 1600-1800 calories 180-200 g carbohydrates 100 g protein 55-60 g fat  Progress Towards Goal(s):  NEW   Nutritional Diagnosis:  [place]-2.1 Impaired nutrient utilization related to excessive CHO intake as evidenced by an A1c of 7.3% and patient reported food recall.    Intervention/Goals:  Follow Diabetes Meal Plan as instructed (yellow card).  Eat 3 meals and 2 snacks, every 3-5 hrs.  Limit carbohydrate intake  to 45-60 grams per meal and 15 grams per snack.  Add lean protein foods to all meals and snacks  Monitor glucose levels as instructed by your doctor; bring glucose log to your next visit  Exercise at least 30-45 minutes/day, 3-4 days a week  Monitoring/Evaluation:  Dietary intake, exercise, A1c, and body weight in 6 week(s).

## 2011-04-25 ENCOUNTER — Encounter: Payer: Self-pay | Admitting: *Deleted

## 2011-05-05 ENCOUNTER — Other Ambulatory Visit: Payer: Self-pay | Admitting: Internal Medicine

## 2011-05-05 MED ORDER — GLUCOSE BLOOD VI STRP: ORAL_STRIP | Status: AC

## 2011-05-22 ENCOUNTER — Telehealth: Payer: Self-pay | Admitting: *Deleted

## 2011-05-22 NOTE — Telephone Encounter (Signed)
Caller states that patient went to get refill on Nascobal and it is not covered by Medicare and will cost $340 out of pocket. Caller would like to know if you recommend B12 injections [if so; when, how often...] or if you believe that B12 Supplement pill would be sufficient at this time.?

## 2011-05-23 NOTE — Telephone Encounter (Signed)
1000 mcg B12 every month; recheck B12 levels with CBC & dif  after 4 months (281.0)

## 2011-05-26 ENCOUNTER — Ambulatory Visit (INDEPENDENT_AMBULATORY_CARE_PROVIDER_SITE_OTHER): Payer: Medicare Other | Admitting: *Deleted

## 2011-05-26 DIAGNOSIS — E538 Deficiency of other specified B group vitamins: Secondary | ICD-10-CM

## 2011-05-26 MED ORDER — CYANOCOBALAMIN 1000 MCG/ML IJ SOLN
1000.0000 ug | Freq: Once | INTRAMUSCULAR | Status: AC
  Administered 2011-05-26: 1000 ug via INTRAMUSCULAR

## 2011-06-20 ENCOUNTER — Ambulatory Visit (INDEPENDENT_AMBULATORY_CARE_PROVIDER_SITE_OTHER): Payer: Medicare Other | Admitting: *Deleted

## 2011-06-20 DIAGNOSIS — E538 Deficiency of other specified B group vitamins: Secondary | ICD-10-CM

## 2011-06-20 MED ORDER — CYANOCOBALAMIN 1000 MCG/ML IJ SOLN
1000.0000 ug | Freq: Once | INTRAMUSCULAR | Status: AC
  Administered 2011-06-20: 1000 ug via INTRAMUSCULAR

## 2011-07-24 ENCOUNTER — Ambulatory Visit (INDEPENDENT_AMBULATORY_CARE_PROVIDER_SITE_OTHER): Payer: Medicare Other | Admitting: *Deleted

## 2011-07-24 DIAGNOSIS — E538 Deficiency of other specified B group vitamins: Secondary | ICD-10-CM

## 2011-07-24 MED ORDER — CYANOCOBALAMIN 1000 MCG/ML IJ SOLN
1000.0000 ug | Freq: Once | INTRAMUSCULAR | Status: AC
  Administered 2011-07-24: 1000 ug via INTRAMUSCULAR

## 2011-08-25 ENCOUNTER — Ambulatory Visit: Payer: Medicare Other

## 2011-08-25 DIAGNOSIS — D519 Vitamin B12 deficiency anemia, unspecified: Secondary | ICD-10-CM

## 2011-08-25 DIAGNOSIS — D518 Other vitamin B12 deficiency anemias: Secondary | ICD-10-CM

## 2011-08-25 MED ORDER — CYANOCOBALAMIN 1000 MCG/ML IJ SOLN
1000.0000 ug | Freq: Once | INTRAMUSCULAR | Status: AC
  Administered 2011-08-25: 1000 ug via INTRAMUSCULAR

## 2011-09-29 ENCOUNTER — Other Ambulatory Visit: Payer: Self-pay | Admitting: Internal Medicine

## 2011-09-29 NOTE — Telephone Encounter (Signed)
A1C 250.00 

## 2011-10-01 ENCOUNTER — Ambulatory Visit (INDEPENDENT_AMBULATORY_CARE_PROVIDER_SITE_OTHER): Payer: Medicare Other

## 2011-10-01 DIAGNOSIS — D518 Other vitamin B12 deficiency anemias: Secondary | ICD-10-CM

## 2011-10-01 DIAGNOSIS — D519 Vitamin B12 deficiency anemia, unspecified: Secondary | ICD-10-CM

## 2011-10-01 MED ORDER — CYANOCOBALAMIN 1000 MCG/ML IJ SOLN
1000.0000 ug | Freq: Once | INTRAMUSCULAR | Status: AC
  Administered 2011-10-01: 1000 ug via INTRAMUSCULAR

## 2011-10-10 ENCOUNTER — Other Ambulatory Visit: Payer: Self-pay | Admitting: Internal Medicine

## 2011-10-10 NOTE — Telephone Encounter (Signed)
Lipid 272.4/A1c 250.00

## 2011-10-24 DIAGNOSIS — H1030 Unspecified acute conjunctivitis, unspecified eye: Secondary | ICD-10-CM | POA: Diagnosis not present

## 2011-10-29 ENCOUNTER — Ambulatory Visit (INDEPENDENT_AMBULATORY_CARE_PROVIDER_SITE_OTHER): Payer: Medicare Other | Admitting: *Deleted

## 2011-10-29 ENCOUNTER — Other Ambulatory Visit: Payer: Self-pay | Admitting: Internal Medicine

## 2011-10-29 DIAGNOSIS — E538 Deficiency of other specified B group vitamins: Secondary | ICD-10-CM | POA: Diagnosis not present

## 2011-10-29 MED ORDER — CYANOCOBALAMIN 1000 MCG/ML IJ SOLN
1000.0000 ug | Freq: Once | INTRAMUSCULAR | Status: AC
  Administered 2011-10-29: 1000 ug via INTRAMUSCULAR

## 2011-10-29 NOTE — Telephone Encounter (Signed)
A1C 250.00 

## 2011-11-10 ENCOUNTER — Other Ambulatory Visit: Payer: Self-pay | Admitting: Internal Medicine

## 2011-11-10 NOTE — Telephone Encounter (Signed)
Prescription sent to pharmacy.

## 2011-11-20 DIAGNOSIS — H40019 Open angle with borderline findings, low risk, unspecified eye: Secondary | ICD-10-CM | POA: Diagnosis not present

## 2011-11-20 DIAGNOSIS — H2510 Age-related nuclear cataract, unspecified eye: Secondary | ICD-10-CM | POA: Diagnosis not present

## 2011-11-20 DIAGNOSIS — H35039 Hypertensive retinopathy, unspecified eye: Secondary | ICD-10-CM | POA: Diagnosis not present

## 2011-11-20 DIAGNOSIS — E119 Type 2 diabetes mellitus without complications: Secondary | ICD-10-CM | POA: Diagnosis not present

## 2011-11-20 DIAGNOSIS — E11319 Type 2 diabetes mellitus with unspecified diabetic retinopathy without macular edema: Secondary | ICD-10-CM | POA: Diagnosis not present

## 2011-12-01 ENCOUNTER — Telehealth: Payer: Self-pay | Admitting: Internal Medicine

## 2011-12-01 ENCOUNTER — Ambulatory Visit (INDEPENDENT_AMBULATORY_CARE_PROVIDER_SITE_OTHER): Payer: Medicare Other

## 2011-12-01 DIAGNOSIS — D518 Other vitamin B12 deficiency anemias: Secondary | ICD-10-CM

## 2011-12-01 DIAGNOSIS — D519 Vitamin B12 deficiency anemia, unspecified: Secondary | ICD-10-CM

## 2011-12-01 MED ORDER — SITAGLIPTIN PHOSPHATE 100 MG PO TABS: ORAL_TABLET | ORAL | Status: DC

## 2011-12-01 MED ORDER — CYANOCOBALAMIN 1000 MCG/ML IJ SOLN
1000.0000 ug | Freq: Once | INTRAMUSCULAR | Status: AC
  Administered 2011-12-01: 1000 ug via INTRAMUSCULAR

## 2011-12-01 NOTE — Telephone Encounter (Signed)
Refill for Januvia 1000 MG tablet  Qty 30 Last fill date 2.20.13 Take 1-tablet by mouth twice daily

## 2011-12-01 NOTE — Telephone Encounter (Signed)
**   Due for labs: Lipids; A1c urine microalbumin 250.00/995.20/272.4

## 2011-12-29 ENCOUNTER — Other Ambulatory Visit: Payer: Self-pay | Admitting: Internal Medicine

## 2011-12-29 DIAGNOSIS — E119 Type 2 diabetes mellitus without complications: Secondary | ICD-10-CM

## 2011-12-29 MED ORDER — SITAGLIPTIN PHOSPHATE 100 MG PO TABS: ORAL_TABLET | ORAL | Status: DC

## 2011-12-29 NOTE — Telephone Encounter (Signed)
Refill for Januvia 100MG  Tablet Qty 30 Take 1-tablet by mouth once daily  Last refill stated due for labs, nothing on file past or future Last OV 7.19.12

## 2011-12-29 NOTE — Telephone Encounter (Signed)
RX sent, spoke with patient, schedule A1c appointment for Wed. Patient will also get b12 on Wed

## 2011-12-31 ENCOUNTER — Other Ambulatory Visit (INDEPENDENT_AMBULATORY_CARE_PROVIDER_SITE_OTHER): Payer: Medicare Other

## 2011-12-31 DIAGNOSIS — E119 Type 2 diabetes mellitus without complications: Secondary | ICD-10-CM | POA: Diagnosis not present

## 2011-12-31 DIAGNOSIS — D519 Vitamin B12 deficiency anemia, unspecified: Secondary | ICD-10-CM

## 2011-12-31 DIAGNOSIS — D518 Other vitamin B12 deficiency anemias: Secondary | ICD-10-CM | POA: Diagnosis not present

## 2011-12-31 MED ORDER — CYANOCOBALAMIN 1000 MCG/ML IJ SOLN
1000.0000 ug | Freq: Once | INTRAMUSCULAR | Status: AC
  Administered 2011-12-31: 1000 ug via INTRAMUSCULAR

## 2011-12-31 NOTE — Progress Notes (Signed)
LABS ONLY  

## 2011-12-31 NOTE — Progress Notes (Signed)
Addended by: Maurice Small on: 12/31/2011 11:06 AM   Modules accepted: Orders

## 2012-01-25 IMAGING — US US CAROTID DUPLEX BILAT
1 series · 13 of 24 positions shown · non-contrast
Comparison: None.

CLINICAL DATA: Right carotid bruit.

BILATERAL CAROTID DUPLEX ULTRASOUND
TECHNIQUE: Gray scale imaging, color Doppler and duplex ultrasound
was performed of bilateral carotid and vertebral arteries in the
neck.

[Series 1: us carotid duplex bilat · 0.08mm/px · 13 of 62 slices shown]
[im 1/62]
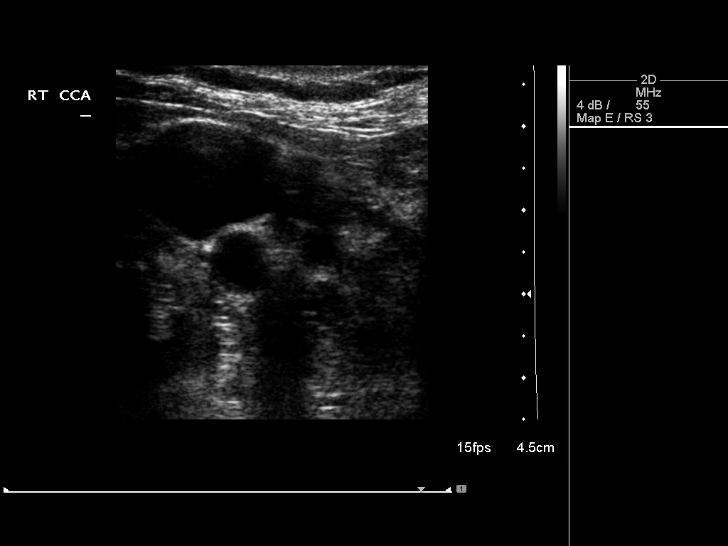
[im 6/62]
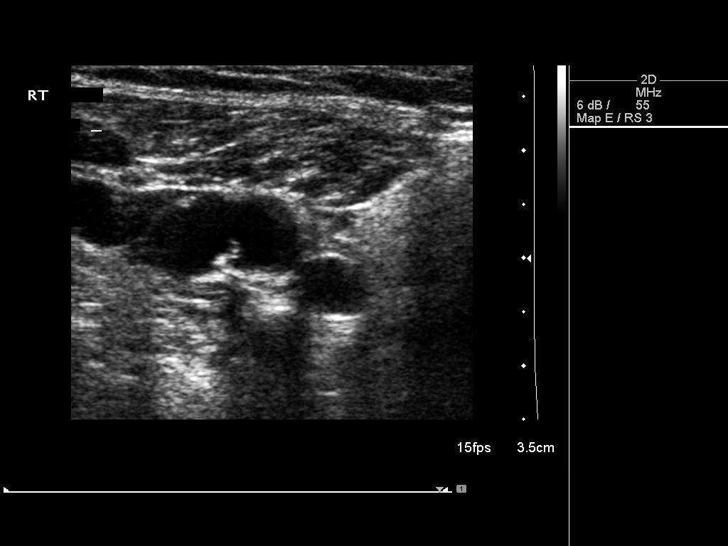
[im 11/62]
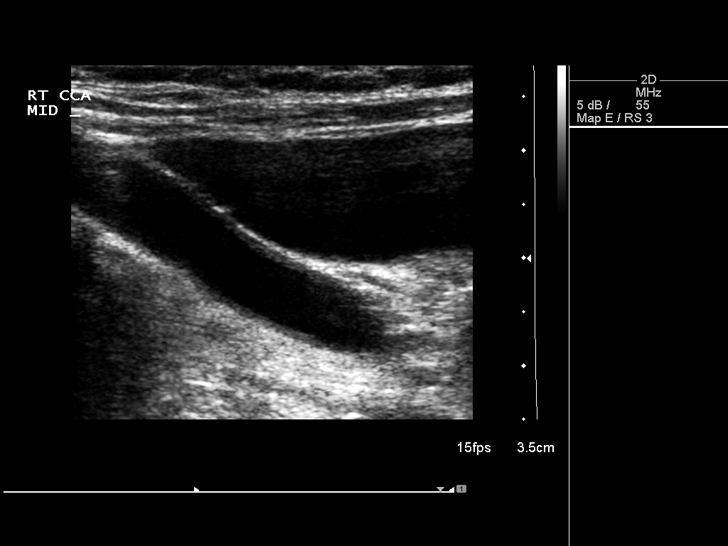
[im 16/62]
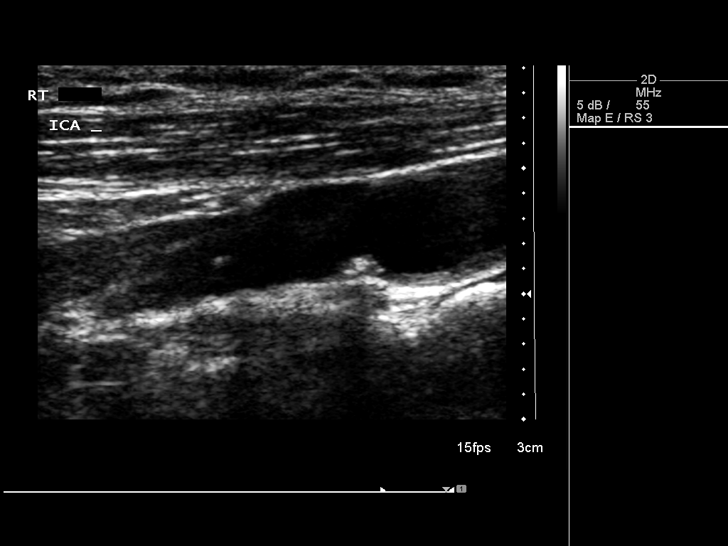
[im 22/62]
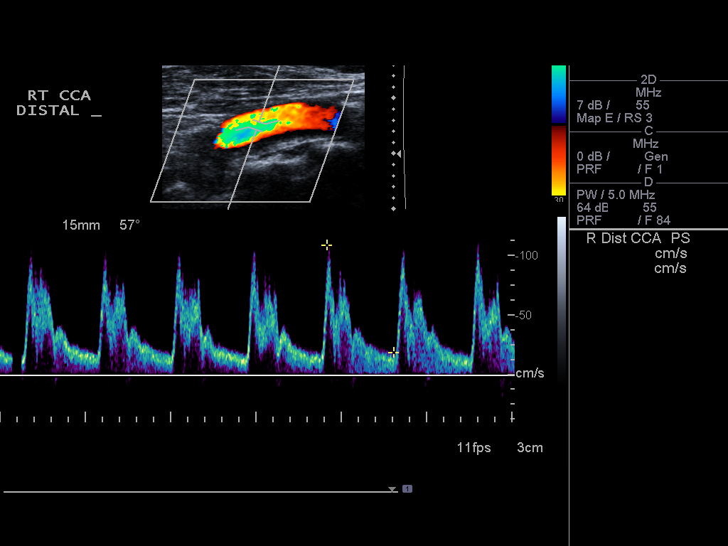
[im 27/62]
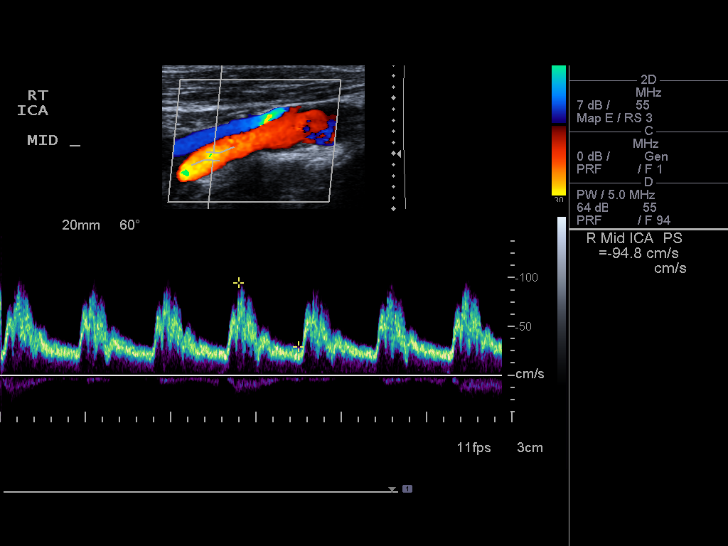
[im 32/62]
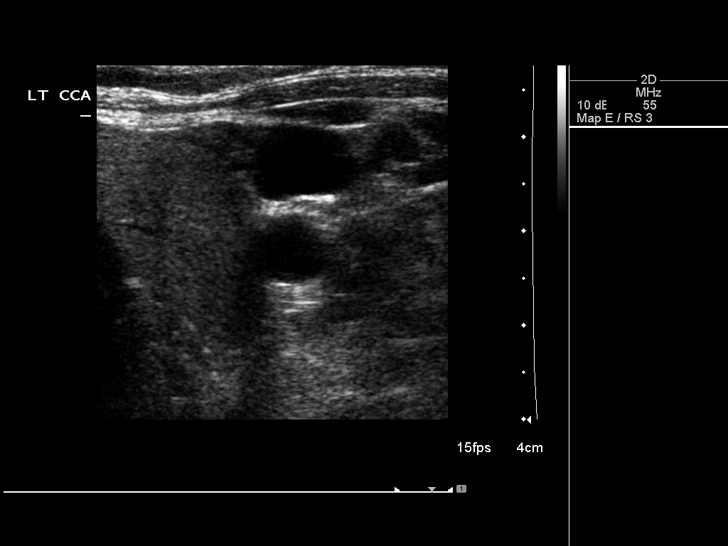
[im 35/62]
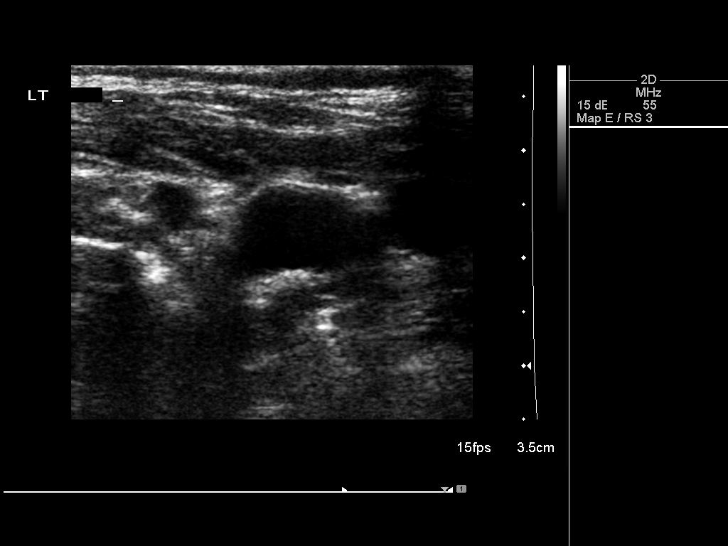
[im 40/62]
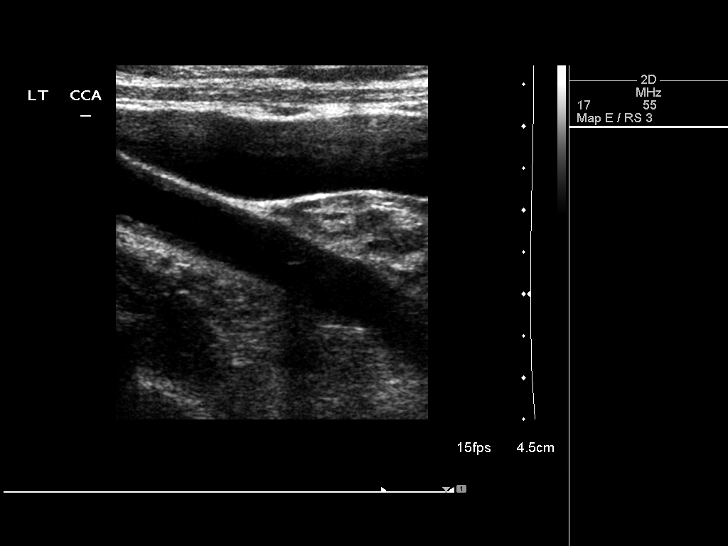
[im 46/62]
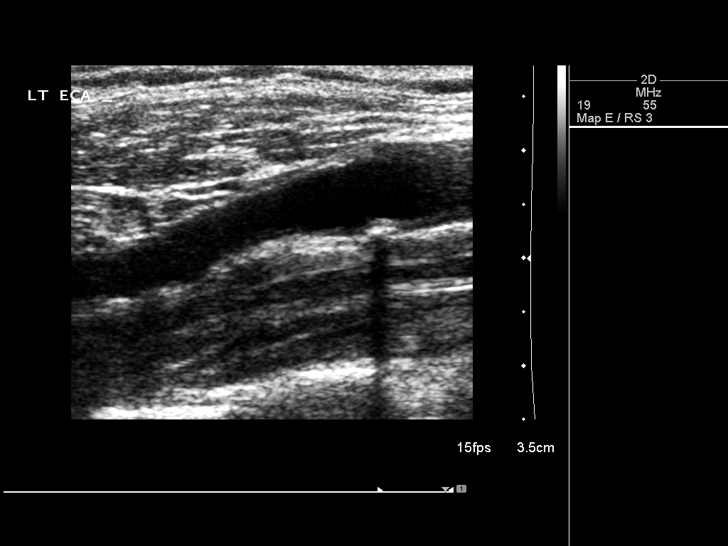
[im 51/62]
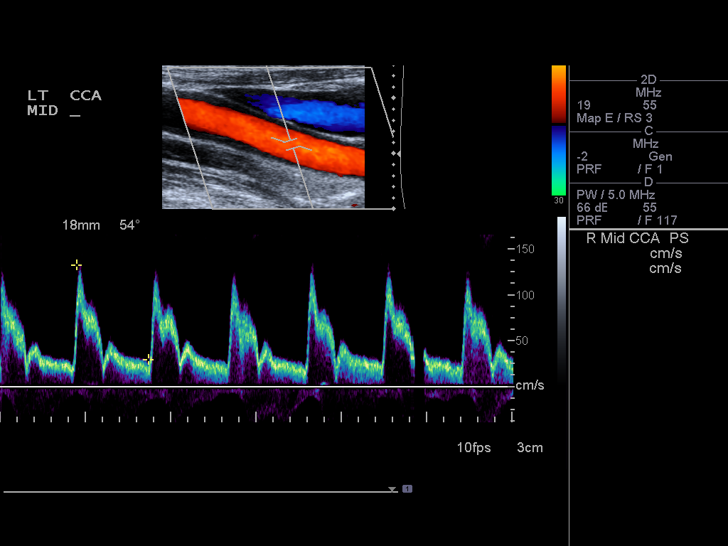
[im 56/62]
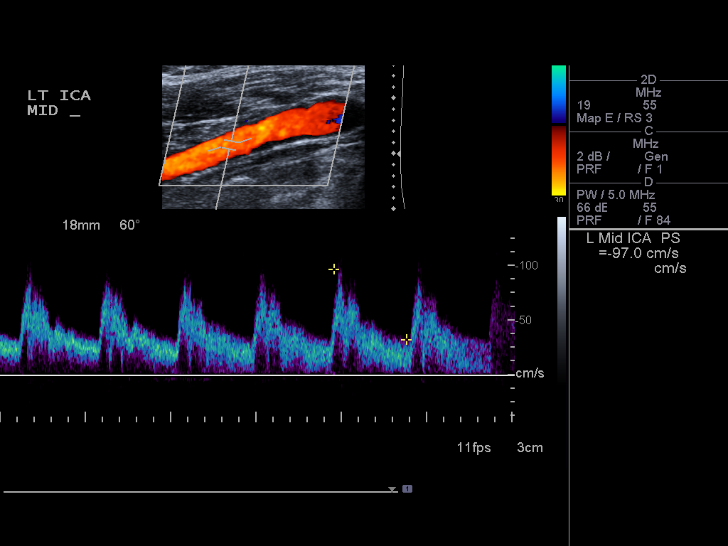
[im 62/62]
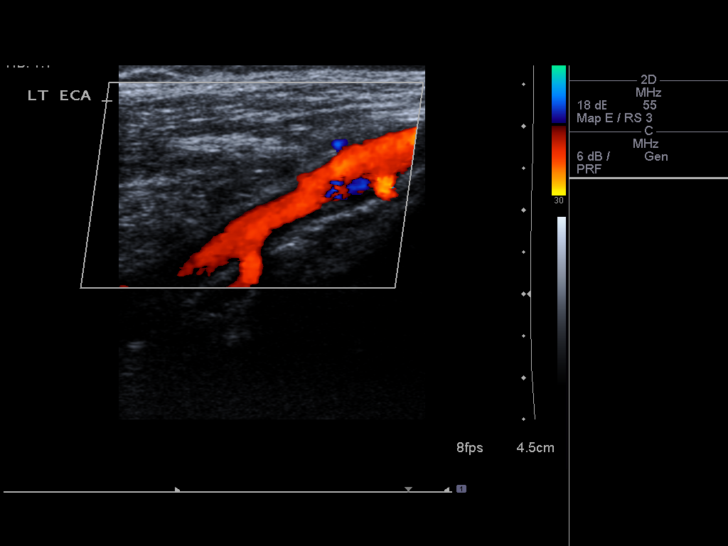

[13 of 24 positions shown; findings below may reference images not displayed]

Criteria:  Quantification of carotid stenosis is based on velocity
parameters that correlate the residual internal carotid diameter
with NASCET-based stenosis levels, using the diameter of the distal
internal carotid lumen as the denominator for stenosis measurement.

The following velocity measurements were obtained:

                 PEAK SYSTOLIC/END DIASTOLIC
RIGHT
ICA:                        100/16cm/sec
CCA:                        134/25cm/sec
SYSTOLIC ICA/CCA RATIO:
DIASTOLIC ICA/CCA RATIO:
ECA:                        113cm/sec

LEFT
ICA:                        120/27cm/sec
CCA:                        133/30cm/sec
SYSTOLIC ICA/CCA RATIO:
DIASTOLIC ICA/CCA RATIO:
ECA:                        146cm/sec
FINDINGS: RIGHT CAROTID ARTERY: Intimal thickening in the common carotid
artery.  A mild amount of eccentric plaque present at the level of
the carotid bulb and proximal ICA which is predominately calcified.
Estimated ICA stenosis is less than 50%.

RIGHT VERTEBRAL ARTERY:  Antegrade flow with normal wave form.

LEFT CAROTID ARTERY: Intimal thickening in the common carotid
artery.  Similar mild amount of predominately calcified plaque at
the level of the distal bulb and proximal ICA.  Plaque also present
at the origin of the external carotid artery.  Estimated ICA
stenosis is less 50%.

LEFT VERTEBRAL ARTERY:  Antegrade flow with normal wave form.
IMPRESSION: Mild amount of atherosclerotic plaque at both carotid bifurcations.
No significant carotid stenosis identified.  Estimated ICA stenoses
are less than 50% bilaterally.

## 2012-01-29 ENCOUNTER — Ambulatory Visit (INDEPENDENT_AMBULATORY_CARE_PROVIDER_SITE_OTHER): Payer: Medicare Other

## 2012-01-29 DIAGNOSIS — D518 Other vitamin B12 deficiency anemias: Secondary | ICD-10-CM

## 2012-01-29 DIAGNOSIS — D519 Vitamin B12 deficiency anemia, unspecified: Secondary | ICD-10-CM

## 2012-01-29 MED ORDER — CYANOCOBALAMIN 1000 MCG/ML IJ SOLN
1000.0000 ug | Freq: Once | INTRAMUSCULAR | Status: AC
  Administered 2012-01-29: 1000 ug via INTRAMUSCULAR

## 2012-01-30 ENCOUNTER — Other Ambulatory Visit: Payer: Self-pay | Admitting: Internal Medicine

## 2012-02-04 ENCOUNTER — Telehealth: Payer: Self-pay | Admitting: *Deleted

## 2012-02-04 MED ORDER — SITAGLIPTIN PHOSPHATE 100 MG PO TABS: 100.0000 mg | ORAL_TABLET | Freq: Every day | ORAL | Status: DC

## 2012-02-04 NOTE — Telephone Encounter (Signed)
Resent refill

## 2012-03-01 ENCOUNTER — Other Ambulatory Visit: Payer: Self-pay | Admitting: Internal Medicine

## 2012-03-01 NOTE — Telephone Encounter (Signed)
Refill done.  

## 2012-03-02 ENCOUNTER — Ambulatory Visit (INDEPENDENT_AMBULATORY_CARE_PROVIDER_SITE_OTHER): Payer: Medicare Other

## 2012-03-02 ENCOUNTER — Other Ambulatory Visit (INDEPENDENT_AMBULATORY_CARE_PROVIDER_SITE_OTHER): Payer: Medicare Other

## 2012-03-02 DIAGNOSIS — D510 Vitamin B12 deficiency anemia due to intrinsic factor deficiency: Secondary | ICD-10-CM | POA: Diagnosis not present

## 2012-03-02 DIAGNOSIS — D519 Vitamin B12 deficiency anemia, unspecified: Secondary | ICD-10-CM

## 2012-03-02 DIAGNOSIS — D518 Other vitamin B12 deficiency anemias: Secondary | ICD-10-CM | POA: Diagnosis not present

## 2012-03-02 MED ORDER — CYANOCOBALAMIN 1000 MCG/ML IJ SOLN
1000.0000 ug | Freq: Once | INTRAMUSCULAR | Status: AC
  Administered 2012-03-02: 1000 ug via INTRAMUSCULAR

## 2012-03-03 LAB — VITAMIN B12: Vitamin B-12: 338 pg/mL (ref 211–911)

## 2012-03-08 ENCOUNTER — Encounter: Payer: Self-pay | Admitting: Internal Medicine

## 2012-03-08 ENCOUNTER — Ambulatory Visit (INDEPENDENT_AMBULATORY_CARE_PROVIDER_SITE_OTHER): Payer: Medicare Other | Admitting: Internal Medicine

## 2012-03-08 VITALS — BP 116/78 | HR 79 | Wt 212.0 lb

## 2012-03-08 DIAGNOSIS — E538 Deficiency of other specified B group vitamins: Secondary | ICD-10-CM | POA: Diagnosis not present

## 2012-03-08 DIAGNOSIS — E785 Hyperlipidemia, unspecified: Secondary | ICD-10-CM | POA: Diagnosis not present

## 2012-03-08 DIAGNOSIS — E119 Type 2 diabetes mellitus without complications: Secondary | ICD-10-CM

## 2012-03-08 DIAGNOSIS — E042 Nontoxic multinodular goiter: Secondary | ICD-10-CM | POA: Diagnosis not present

## 2012-03-08 DIAGNOSIS — I10 Essential (primary) hypertension: Secondary | ICD-10-CM

## 2012-03-08 DIAGNOSIS — Z0000 Encounter for general adult medical examination without abnormal findings: Secondary | ICD-10-CM | POA: Diagnosis not present

## 2012-03-08 LAB — CBC WITH DIFFERENTIAL/PLATELET
Basophils Relative: 0.5 % (ref 0.0–3.0)
Eosinophils Relative: 1.7 % (ref 0.0–5.0)
HCT: 39.6 % (ref 39.0–52.0)
Lymphs Abs: 1.3 10*3/uL (ref 0.7–4.0)
MCV: 88.2 fl (ref 78.0–100.0)
Monocytes Absolute: 0.4 10*3/uL (ref 0.1–1.0)
Neutrophils Relative %: 75.6 % (ref 43.0–77.0)
RBC: 4.49 Mil/uL (ref 4.22–5.81)
WBC: 7.9 10*3/uL (ref 4.5–10.5)

## 2012-03-08 LAB — LIPID PANEL
Cholesterol: 191 mg/dL (ref 0–200)
HDL: 36.9 mg/dL — ABNORMAL LOW (ref >39.00)
VLDL: 41.2 mg/dL — ABNORMAL HIGH (ref 0.0–40.0)

## 2012-03-08 LAB — BASIC METABOLIC PANEL
CO2: 27 mEq/L (ref 19–32)
Calcium: 9.5 mg/dL (ref 8.4–10.5)
GFR: 97.23 mL/min (ref >60.00)
Sodium: 137 mEq/L (ref 135–145)

## 2012-03-08 LAB — HEPATIC FUNCTION PANEL: Albumin: 3.9 g/dL (ref 3.5–5.2)

## 2012-03-08 NOTE — Progress Notes (Signed)
Subjective:    Patient ID: Alex Church, male    DOB: 1946-09-03, 66 y.o.   MRN: [id number]  HPI  Medicare Wellness Visit:  The following psychosocial & medical history were reviewed as required by Medicare.   Social history: caffeine: minimal , alcohol:  no ,  tobacco use : never  & exercise :some weights.   Home & personal  safety / fall risk:no issues, activities of daily living: no limitations , seatbelt use : yes , and smoke alarm employment :yes .  Power of Attorney/Living Will status : yes  Vision ( as recorded per Nurse) & Hearing  evaluation :  Seen 3/13; see exam. Orientation :oriented X3 , memory & recall :good,  math testing: good,and mood & affect : normal . Depression / anxiety: denied Travel history : 2002 [place] , immunization status :Shingles needed , transfusion history:  no, and preventive health surveillance ( colonoscopies, BMD , etc as per protocol/ [facility]): see below, Dental care:  No followup . Chart reviewed &  Updated. Active issues reviewed & addressed.             Review of Systems  HYPERTENSION: Disease Monitoring: Blood pressure range-120s-130s/low 80s  Chest pain, palpitations- no       Dyspnea- no Medications: Compliance- yes  Lightheadedness,Syncope- no    Edema- no  DIABETES: Disease Monitoring: Blood Sugar ranges-120s-180s  Polyuria/phagia/dipsia- no       Visual problems- early cataracts Medications: Compliance- yes  Hypoglycemic symptoms- no  HYPERLIPIDEMIA: Disease Monitoring: See symptoms for Hypertension Medications: Compliance-no statin  Abd pain, bowel changes- no; he has never had a colonoscopy. Standard of care discussed.   Muscle aches- no             he continues on B12 shots monthly; his B12 level is low normal at 338. Inhaled B12 is not option due to cost.     Objective:   Physical Exam  Gen.: Healthy and well-nourished in appearance. Alert, appropriate and cooperative throughout exam. Head:  Normocephalic without obvious abnormalities; no alopecia  Eyes: No corneal or conjunctival inflammation noted. Pupils equal round reactive to light and accommodation.  Extraocular motion intact. Vision grossly normal with lenses. Ears: External  ear exam reveals no significant lesions or deformities. Canals clear .TMs normal. Hearing is grossly normal bilaterally. Nose: External nasal exam reveals no deformity or inflammation. Nasal mucosa are pink and moist. No lesions or exudates noted.   Mouth: Oral mucosa and oropharynx reveal no lesions or exudates. Teeth in fair repair. Neck: No deformities, masses, or tenderness noted. Range of motion normal. Thyroid :physiologic asymmetry Lungs: Normal respiratory effort; chest expands symmetrically. Lungs are clear to auscultation without rales, wheezes, or increased work of breathing. Heart: Normal rate and rhythm. Normal S1 and S2. No gallop, click, or rub. S 4 w/o murmur. Abdomen: Bowel sounds normal; abdomen soft and nontender. No masses, organomegaly or hernias noted. Genitalia/ DRE:  Genitalia normal except for small left varices Prostate is upper limits of normal w/o enlargement, asymmetry, nodularity, or induration.                                         Musculoskeletal/extremities: No deformity or scoliosis noted of  the thoracic or lumbar spine. No clubbing, cyanosis, edema, or deformity noted. Range of motion  normal .Tone & strength  normal.Joints normal. Toe nail health : chronic thickening  Vascular: Carotid, radial artery  are full and equal.Slightly decreased  dorsalis pedis and  posterior tibial pulses. No bruits present. Neurologic: Alert and oriented x3. Deep tendon reflexes symmetrical and normal.          Skin: Intact without suspicious lesions or rashes. Lymph: No cervical, axillary, or inguinal lymphadenopathy present. Psych: Mood and affect are normal. Normally interactive                                                                                           Assessment & Plan:  #1 Medicare Wellness Exam; criteria met ; data entered #2 Problem List reviewed ; Assessment/ Recommendations made Plan: see Orders

## 2012-03-08 NOTE — Patient Instructions (Addendum)
Preventive Health Care: Exercise at least 30-45 minutes a day,  3-4 days a week.  Eat a low-fat diet with lots of fruits and vegetables, up to 7-9 servings per day.  Consume less than 40 grams of sugar per day from foods & drinks with High Fructose Corn Sugar as #2,3 or # 4 on label. Caries ( cavities) and plaque on the teeth can lead to significant local and systemic infections with threat to your health.Please have dental care completed as soon as possible.  As per the Standard of Care , screening Colonoscopy recommended @ 50 & every 5-10 years thereafter . More frequent monitor would be dictated by family history or findings @ Colonoscopy.  Annual podiatry and retinal assessments are indicated because the diabetes.  Please try to go on My Chart within the next 24 hours to allow me to release the results directly to you.

## 2012-03-09 ENCOUNTER — Encounter: Payer: Self-pay | Admitting: *Deleted

## 2012-03-09 ENCOUNTER — Encounter: Payer: Self-pay | Admitting: Internal Medicine

## 2012-03-12 ENCOUNTER — Other Ambulatory Visit: Payer: Self-pay | Admitting: Internal Medicine

## 2012-03-30 ENCOUNTER — Other Ambulatory Visit: Payer: Self-pay | Admitting: Internal Medicine

## 2012-03-30 MED ORDER — SITAGLIPTIN PHOSPHATE 100 MG PO TABS: 100.0000 mg | ORAL_TABLET | Freq: Every day | ORAL | Status: DC

## 2012-03-30 NOTE — Telephone Encounter (Signed)
RX sent

## 2012-03-30 NOTE — Telephone Encounter (Signed)
Refill JANUVIA 100 MG Take 1 tablet (100 mg total) by mouth daily, #30 last fill 5.29.13, last ov 7.1.13 CPE

## 2012-03-31 ENCOUNTER — Ambulatory Visit (INDEPENDENT_AMBULATORY_CARE_PROVIDER_SITE_OTHER): Payer: Medicare Other | Admitting: *Deleted

## 2012-03-31 DIAGNOSIS — E538 Deficiency of other specified B group vitamins: Secondary | ICD-10-CM

## 2012-03-31 MED ORDER — CYANOCOBALAMIN 1000 MCG/ML IJ SOLN
1000.0000 ug | Freq: Once | INTRAMUSCULAR | Status: AC
  Administered 2012-03-31: 1000 ug via INTRAMUSCULAR

## 2012-04-12 ENCOUNTER — Other Ambulatory Visit: Payer: Self-pay | Admitting: Internal Medicine

## 2012-04-30 ENCOUNTER — Other Ambulatory Visit: Payer: Self-pay | Admitting: Internal Medicine

## 2012-04-30 ENCOUNTER — Ambulatory Visit (INDEPENDENT_AMBULATORY_CARE_PROVIDER_SITE_OTHER): Payer: Medicare Other

## 2012-04-30 DIAGNOSIS — D519 Vitamin B12 deficiency anemia, unspecified: Secondary | ICD-10-CM

## 2012-04-30 DIAGNOSIS — D518 Other vitamin B12 deficiency anemias: Secondary | ICD-10-CM | POA: Diagnosis not present

## 2012-04-30 MED ORDER — CYANOCOBALAMIN 1000 MCG/ML IJ SOLN
1000.0000 ug | Freq: Once | INTRAMUSCULAR | Status: AC
  Administered 2012-04-30: 1000 ug via INTRAMUSCULAR

## 2012-06-03 ENCOUNTER — Encounter: Payer: Self-pay | Admitting: Internal Medicine

## 2012-06-04 ENCOUNTER — Ambulatory Visit (INDEPENDENT_AMBULATORY_CARE_PROVIDER_SITE_OTHER): Payer: Medicare Other

## 2012-06-04 DIAGNOSIS — E538 Deficiency of other specified B group vitamins: Secondary | ICD-10-CM

## 2012-06-04 DIAGNOSIS — Z23 Encounter for immunization: Secondary | ICD-10-CM

## 2012-06-04 MED ORDER — CYANOCOBALAMIN 1000 MCG/ML IJ SOLN
1000.0000 ug | Freq: Once | INTRAMUSCULAR | Status: AC
  Administered 2012-06-04: 1000 ug via INTRAMUSCULAR

## 2012-06-28 ENCOUNTER — Other Ambulatory Visit: Payer: Self-pay | Admitting: Internal Medicine

## 2012-07-05 ENCOUNTER — Other Ambulatory Visit: Payer: Self-pay | Admitting: Internal Medicine

## 2012-07-05 NOTE — Telephone Encounter (Signed)
Rx sent.    MW 

## 2012-07-08 ENCOUNTER — Ambulatory Visit (INDEPENDENT_AMBULATORY_CARE_PROVIDER_SITE_OTHER): Payer: Medicare Other

## 2012-07-08 DIAGNOSIS — E538 Deficiency of other specified B group vitamins: Secondary | ICD-10-CM

## 2012-07-08 MED ORDER — CYANOCOBALAMIN 1000 MCG/ML IJ SOLN
1000.0000 ug | Freq: Once | INTRAMUSCULAR | Status: AC
  Administered 2012-07-08: 1000 ug via INTRAMUSCULAR

## 2012-07-09 ENCOUNTER — Other Ambulatory Visit: Payer: Self-pay | Admitting: Internal Medicine

## 2012-07-09 NOTE — Telephone Encounter (Signed)
250.02,277.7. Lipid/A1c

## 2012-08-09 ENCOUNTER — Other Ambulatory Visit: Payer: Self-pay | Admitting: Internal Medicine

## 2012-08-09 NOTE — Telephone Encounter (Signed)
Rx sent.    MW 

## 2012-08-11 ENCOUNTER — Ambulatory Visit (INDEPENDENT_AMBULATORY_CARE_PROVIDER_SITE_OTHER): Payer: Medicare Other

## 2012-08-11 DIAGNOSIS — E538 Deficiency of other specified B group vitamins: Secondary | ICD-10-CM | POA: Diagnosis not present

## 2012-08-11 MED ORDER — CYANOCOBALAMIN 1000 MCG/ML IJ SOLN
1000.0000 ug | Freq: Once | INTRAMUSCULAR | Status: AC
  Administered 2012-08-11: 1000 ug via INTRAMUSCULAR

## 2012-09-07 ENCOUNTER — Other Ambulatory Visit: Payer: Self-pay | Admitting: Internal Medicine

## 2012-09-07 DIAGNOSIS — E8881 Metabolic syndrome: Secondary | ICD-10-CM

## 2012-09-07 MED ORDER — METFORMIN HCL 1000 MG PO TABS: ORAL_TABLET | ORAL | Status: DC

## 2012-09-07 NOTE — Telephone Encounter (Signed)
refill MetFORMIN HCl (Tab) 1000 MG TAKE 1 TABLET BY MOUTH TWICE DAILY WITH MEALS #60 last fill 12.2.13, last ov wt/labs V70 7.1.13

## 2012-09-07 NOTE — Telephone Encounter (Signed)
RX sent, patient due for labs. Future orders placed

## 2012-09-13 ENCOUNTER — Other Ambulatory Visit: Payer: Self-pay | Admitting: *Deleted

## 2012-09-13 MED ORDER — METFORMIN HCL 1000 MG PO TABS: ORAL_TABLET | ORAL | Status: DC

## 2012-09-13 NOTE — Telephone Encounter (Signed)
Refill for metformin sent to [facility] per pt request

## 2012-09-16 ENCOUNTER — Ambulatory Visit (INDEPENDENT_AMBULATORY_CARE_PROVIDER_SITE_OTHER): Payer: Medicare Other

## 2012-09-16 DIAGNOSIS — E538 Deficiency of other specified B group vitamins: Secondary | ICD-10-CM | POA: Diagnosis not present

## 2012-09-16 MED ORDER — CYANOCOBALAMIN 1000 MCG/ML IJ SOLN: 1000.0000 ug | Freq: Once | INTRAMUSCULAR | Status: DC

## 2012-09-17 ENCOUNTER — Encounter: Payer: Medicare Other | Admitting: Internal Medicine

## 2012-10-06 ENCOUNTER — Other Ambulatory Visit: Payer: Self-pay | Admitting: Internal Medicine

## 2012-10-06 NOTE — Telephone Encounter (Signed)
A1c, lipid 250.02/277.7 

## 2012-10-11 ENCOUNTER — Other Ambulatory Visit: Payer: Self-pay | Admitting: Internal Medicine

## 2012-10-12 ENCOUNTER — Telehealth: Payer: Self-pay | Admitting: Internal Medicine

## 2012-10-12 MED ORDER — ZOSTER VACCINE LIVE 19400 UNT/0.65ML ~~LOC~~ SOLR: 0.6500 mL | Freq: Once | SUBCUTANEOUS | Status: DC

## 2012-10-12 NOTE — Telephone Encounter (Signed)
Rx placed at the front for pick-up

## 2012-10-12 NOTE — Telephone Encounter (Signed)
pt would like to pick up RX for Shingles injection on thursday 2.6.14 when he comes in for his B12 Injection at 3:15pm

## 2012-10-14 ENCOUNTER — Ambulatory Visit (INDEPENDENT_AMBULATORY_CARE_PROVIDER_SITE_OTHER): Payer: Medicare Other

## 2012-10-14 DIAGNOSIS — D519 Vitamin B12 deficiency anemia, unspecified: Secondary | ICD-10-CM

## 2012-10-14 DIAGNOSIS — D518 Other vitamin B12 deficiency anemias: Secondary | ICD-10-CM | POA: Diagnosis not present

## 2012-10-14 MED ORDER — CYANOCOBALAMIN 1000 MCG/ML IJ SOLN
1000.0000 ug | Freq: Once | INTRAMUSCULAR | Status: AC
  Administered 2012-10-14: 1000 ug via INTRAMUSCULAR

## 2012-11-08 ENCOUNTER — Other Ambulatory Visit: Payer: Self-pay | Admitting: Internal Medicine

## 2012-11-09 NOTE — Telephone Encounter (Signed)
A1c, lipid 250.02/277.7

## 2012-11-09 NOTE — Telephone Encounter (Signed)
Pt wife left VM that pharmacy has contacted [place] several time with no response. Called Pt back since wife not on DPR and advise Rx has been sent but labs are due now. Offer to schedule lab appt Pt decline stating that he is due for a b12 injection so when he come to get that he will have labs done then as well.

## 2012-11-12 ENCOUNTER — Other Ambulatory Visit: Payer: Self-pay | Admitting: Internal Medicine

## 2012-11-18 ENCOUNTER — Other Ambulatory Visit (INDEPENDENT_AMBULATORY_CARE_PROVIDER_SITE_OTHER): Payer: Medicare Other

## 2012-11-18 ENCOUNTER — Ambulatory Visit (INDEPENDENT_AMBULATORY_CARE_PROVIDER_SITE_OTHER): Payer: Medicare Other

## 2012-11-18 DIAGNOSIS — D518 Other vitamin B12 deficiency anemias: Secondary | ICD-10-CM | POA: Diagnosis not present

## 2012-11-18 DIAGNOSIS — E8881 Metabolic syndrome: Secondary | ICD-10-CM

## 2012-11-18 DIAGNOSIS — IMO0001 Reserved for inherently not codable concepts without codable children: Secondary | ICD-10-CM

## 2012-11-18 LAB — LIPID PANEL
Total CHOL/HDL Ratio: 5
VLDL: 40.6 mg/dL — ABNORMAL HIGH (ref 0.0–40.0)

## 2012-11-18 LAB — LDL CHOLESTEROL, DIRECT: Direct LDL: 121.8 mg/dL

## 2012-11-18 MED ORDER — CYANOCOBALAMIN 1000 MCG/ML IJ SOLN
1000.0000 ug | Freq: Once | INTRAMUSCULAR | Status: AC
  Administered 2012-11-18: 1000 ug via INTRAMUSCULAR

## 2012-11-20 ENCOUNTER — Encounter: Payer: Self-pay | Admitting: Internal Medicine

## 2012-11-22 ENCOUNTER — Other Ambulatory Visit: Payer: Self-pay | Admitting: Internal Medicine

## 2012-11-22 MED ORDER — METFORMIN HCL 1000 MG PO TABS: ORAL_TABLET | ORAL | Status: DC

## 2012-11-22 MED ORDER — CLONIDINE HCL 0.2 MG PO TABS: ORAL_TABLET | ORAL | Status: DC

## 2012-11-22 NOTE — Telephone Encounter (Signed)
Hopp please advise, patient had labs done on 11/18/2012 and you indicated patient needs to see you prior to having refills, patient would like medications refilled until CPX appointment in July. Please advise if it is ok to fill medications until July

## 2013-01-04 ENCOUNTER — Encounter: Payer: Self-pay | Admitting: Internal Medicine

## 2013-01-05 NOTE — Telephone Encounter (Signed)
Patient called for an appointment.

## 2013-01-06 ENCOUNTER — Ambulatory Visit (INDEPENDENT_AMBULATORY_CARE_PROVIDER_SITE_OTHER): Payer: Medicare Other

## 2013-01-06 DIAGNOSIS — D518 Other vitamin B12 deficiency anemias: Secondary | ICD-10-CM | POA: Diagnosis not present

## 2013-01-06 DIAGNOSIS — D519 Vitamin B12 deficiency anemia, unspecified: Secondary | ICD-10-CM

## 2013-01-06 MED ORDER — CYANOCOBALAMIN 1000 MCG/ML IJ SOLN
1000.0000 ug | Freq: Once | INTRAMUSCULAR | Status: AC
  Administered 2013-01-06: 1000 ug via INTRAMUSCULAR

## 2013-02-11 ENCOUNTER — Ambulatory Visit (INDEPENDENT_AMBULATORY_CARE_PROVIDER_SITE_OTHER): Payer: Medicare Other | Admitting: *Deleted

## 2013-02-11 DIAGNOSIS — E538 Deficiency of other specified B group vitamins: Secondary | ICD-10-CM

## 2013-02-11 MED ORDER — CYANOCOBALAMIN 1000 MCG/ML IJ SOLN
1000.0000 ug | Freq: Once | INTRAMUSCULAR | Status: AC
  Administered 2013-02-11: 1000 ug via INTRAMUSCULAR

## 2013-02-18 ENCOUNTER — Other Ambulatory Visit: Payer: Self-pay | Admitting: Internal Medicine

## 2013-02-18 NOTE — Telephone Encounter (Signed)
Patient needs to schedule a CPX  

## 2013-03-16 ENCOUNTER — Ambulatory Visit (INDEPENDENT_AMBULATORY_CARE_PROVIDER_SITE_OTHER): Payer: Medicare Other | Admitting: *Deleted

## 2013-03-16 DIAGNOSIS — E538 Deficiency of other specified B group vitamins: Secondary | ICD-10-CM

## 2013-03-16 MED ORDER — CYANOCOBALAMIN 1000 MCG/ML IJ SOLN
1000.0000 ug | Freq: Once | INTRAMUSCULAR | Status: AC
  Administered 2013-03-16: 1000 ug via INTRAMUSCULAR

## 2013-03-18 ENCOUNTER — Other Ambulatory Visit: Payer: Self-pay | Admitting: Internal Medicine

## 2013-03-18 NOTE — Telephone Encounter (Signed)
A1c MALB 250.00 

## 2013-03-28 ENCOUNTER — Other Ambulatory Visit: Payer: Self-pay | Admitting: Internal Medicine

## 2013-04-08 ENCOUNTER — Other Ambulatory Visit: Payer: Self-pay | Admitting: Internal Medicine

## 2013-04-08 NOTE — Telephone Encounter (Signed)
Appointment in 05/2013

## 2013-04-13 ENCOUNTER — Other Ambulatory Visit: Payer: Self-pay

## 2013-04-14 ENCOUNTER — Ambulatory Visit (INDEPENDENT_AMBULATORY_CARE_PROVIDER_SITE_OTHER): Payer: Medicare Other

## 2013-04-14 ENCOUNTER — Other Ambulatory Visit: Payer: Self-pay | Admitting: Internal Medicine

## 2013-04-14 DIAGNOSIS — E538 Deficiency of other specified B group vitamins: Secondary | ICD-10-CM

## 2013-04-14 MED ORDER — CYANOCOBALAMIN 1000 MCG/ML IJ SOLN
1000.0000 ug | Freq: Once | INTRAMUSCULAR | Status: AC
  Administered 2013-04-14: 1000 ug via INTRAMUSCULAR

## 2013-04-14 NOTE — Addendum Note (Signed)
Addended by: Maurice Small on: 04/14/2013 02:22 PM   Modules accepted: Orders

## 2013-04-14 NOTE — Telephone Encounter (Signed)
A1c/MALB 250.00

## 2013-04-15 ENCOUNTER — Other Ambulatory Visit: Payer: Self-pay | Admitting: Internal Medicine

## 2013-04-15 DIAGNOSIS — E119 Type 2 diabetes mellitus without complications: Secondary | ICD-10-CM

## 2013-04-15 NOTE — Telephone Encounter (Signed)
Patient is due for labs (future orders placed), patient should also schedule CPX

## 2013-04-18 ENCOUNTER — Other Ambulatory Visit: Payer: Self-pay | Admitting: Internal Medicine

## 2013-04-26 ENCOUNTER — Other Ambulatory Visit: Payer: Self-pay | Admitting: Internal Medicine

## 2013-04-26 NOTE — Telephone Encounter (Signed)
Pending appointment 05/2013 

## 2013-05-19 ENCOUNTER — Ambulatory Visit (INDEPENDENT_AMBULATORY_CARE_PROVIDER_SITE_OTHER): Payer: Medicare Other

## 2013-05-19 DIAGNOSIS — E538 Deficiency of other specified B group vitamins: Secondary | ICD-10-CM

## 2013-05-19 MED ORDER — CYANOCOBALAMIN 1000 MCG/ML IJ SOLN
1000.0000 ug | Freq: Once | INTRAMUSCULAR | Status: AC
  Administered 2013-05-19: 1000 ug via INTRAMUSCULAR

## 2013-05-20 ENCOUNTER — Other Ambulatory Visit: Payer: Self-pay | Admitting: Internal Medicine

## 2013-05-23 NOTE — Telephone Encounter (Signed)
Med filled #30 until appt.

## 2013-05-30 ENCOUNTER — Other Ambulatory Visit: Payer: Self-pay | Admitting: Internal Medicine

## 2013-05-31 ENCOUNTER — Encounter: Payer: Medicare Other | Admitting: Internal Medicine

## 2013-05-31 NOTE — Telephone Encounter (Signed)
Patient's wife is calling to check refill status.

## 2013-06-01 ENCOUNTER — Telehealth: Payer: Self-pay | Admitting: *Deleted

## 2013-06-01 NOTE — Telephone Encounter (Signed)
Error

## 2013-06-01 NOTE — Telephone Encounter (Signed)
Refill for Carvedilol sent to [facility] pharmacy

## 2013-06-08 ENCOUNTER — Other Ambulatory Visit: Payer: Self-pay | Admitting: Internal Medicine

## 2013-06-08 DIAGNOSIS — I10 Essential (primary) hypertension: Secondary | ICD-10-CM

## 2013-06-08 NOTE — Telephone Encounter (Signed)
Refill for lisinopril sent to pharmacy. #30 with 0 refills, patient needs OV

## 2013-06-15 ENCOUNTER — Other Ambulatory Visit: Payer: Self-pay | Admitting: Internal Medicine

## 2013-06-15 NOTE — Telephone Encounter (Signed)
Patient is requesting a refill for metformin and januvia. Is is overdue for labs and an OV. He did have an appt last month, but cancelled. Please advise.

## 2013-06-15 NOTE — Telephone Encounter (Signed)
Metformin and Januvia refills sent to pharmacy 

## 2013-06-15 NOTE — Telephone Encounter (Signed)
OK X 1 month ;schedule appt.Meds will not be refilled w/o F/U after these Rxs

## 2013-06-17 ENCOUNTER — Ambulatory Visit (INDEPENDENT_AMBULATORY_CARE_PROVIDER_SITE_OTHER): Payer: Medicare Other | Admitting: *Deleted

## 2013-06-17 DIAGNOSIS — Z23 Encounter for immunization: Secondary | ICD-10-CM

## 2013-06-17 DIAGNOSIS — E538 Deficiency of other specified B group vitamins: Secondary | ICD-10-CM | POA: Diagnosis not present

## 2013-06-17 MED ORDER — CYANOCOBALAMIN 1000 MCG/ML IJ SOLN
1000.0000 ug | Freq: Once | INTRAMUSCULAR | Status: AC
  Administered 2013-06-17: 1000 ug via INTRAMUSCULAR

## 2013-06-27 ENCOUNTER — Other Ambulatory Visit: Payer: Self-pay | Admitting: Internal Medicine

## 2013-06-27 NOTE — Telephone Encounter (Signed)
Clonidine refill sent to pharmacy. #60, 0 refills. OV due

## 2013-07-04 ENCOUNTER — Telehealth: Payer: Self-pay

## 2013-07-04 NOTE — Telephone Encounter (Addendum)
Left message for call back Identifiable  Medication List and allergies: reviewed and updated  90 day supply/mail order: na Local prescriptions: [facility] or [facility]  Immunizations due: UTD WE PNA  A/P:  Flu vaccine 06/17/2013  Never had CCS No FH or PSH changes PSA--09/2010--WNL  To Discuss with Provider: Not at this time

## 2013-07-06 ENCOUNTER — Encounter: Payer: Self-pay | Admitting: Internal Medicine

## 2013-07-06 ENCOUNTER — Ambulatory Visit (INDEPENDENT_AMBULATORY_CARE_PROVIDER_SITE_OTHER): Payer: Medicare Other | Admitting: Internal Medicine

## 2013-07-06 VITALS — BP 105/67 | HR 78 | Temp 98.0°F | Ht 71.75 in | Wt 201.2 lb

## 2013-07-06 DIAGNOSIS — E538 Deficiency of other specified B group vitamins: Secondary | ICD-10-CM

## 2013-07-06 DIAGNOSIS — E782 Mixed hyperlipidemia: Secondary | ICD-10-CM

## 2013-07-06 DIAGNOSIS — I10 Essential (primary) hypertension: Secondary | ICD-10-CM

## 2013-07-06 DIAGNOSIS — Z0000 Encounter for general adult medical examination without abnormal findings: Secondary | ICD-10-CM | POA: Diagnosis not present

## 2013-07-06 DIAGNOSIS — E1169 Type 2 diabetes mellitus with other specified complication: Secondary | ICD-10-CM | POA: Diagnosis not present

## 2013-07-06 DIAGNOSIS — E119 Type 2 diabetes mellitus without complications: Secondary | ICD-10-CM | POA: Insufficient documentation

## 2013-07-06 DIAGNOSIS — E1159 Type 2 diabetes mellitus with other circulatory complications: Secondary | ICD-10-CM | POA: Insufficient documentation

## 2013-07-06 NOTE — Progress Notes (Signed)
Subjective:    Patient ID: Alex Church, male    DOB: April 16, 1946, 67 y.o.   MRN: [id number]  HPI Medicare Wellness Visit: Psychosocial and medical history were reviewed as required by Medicare (history related to abuse, antisocial behavior , firearm risk). Social history: Caffeine:none  , Alcohol: no , Tobacco use: never Exercise:walking 1 hr 5 X/ week w/o CV symptoms Personal safety/fall risk:no Limitations of activities of daily living:no Seatbelt/ smoke alarm use:yes Healthcare Power of Attorney/Living Will status:  In place Ophthalmologic exam status:current Hearing evaluation status:not current Orientation: Oriented X3 Memory and recall: good Spelling  testing: good Depression/anxiety assessment: denied Foreign travel history:2002 [place] Immunization status for influenza/pneumonia/ shingles /tetanus:? PNA needed Transfusion history:no Preventive health care maintenance status: Colonoscopy as per protocol/standard care:never (SOC discussed) Dental care:no F/U ("I never have gone") Chart reviewed and updated. Active issues reviewed and addressed as documented below.    Review of Systems HYPERTENSION: Disease Monitoring: Blood pressure range-120s/70s Chest pain, palpitations- no       Dyspnea- no Medications: Compliance- yes  Lightheadedness,Syncope- no   Edema- no  DM: FBS 120-180 Hypoglycemia- no Polyuria/phagia/dipsia- no      Visual problems- only cataracts Skin lesions- no Limb burning/ tingling- no  HYPERLIPIDEMIA: Disease Monitoring: See symptoms for Hypertension Medications: Compliance- no statin to date      Objective:   Physical Exam Gen.:  well-nourished in appearance. Alert, appropriate and cooperative throughout exam.Appears younger than stated age  Head: Normocephalic without obvious abnormalities; no alopecia  Eyes: No corneal or conjunctival inflammation noted. Pupils equal round reactive to light and accommodation.  Extraocular motion  intact. Ears: External  ear exam reveals no significant lesions or deformities. Wax bilaterally. Hearing is grossly normal bilaterally. Nose: External nasal exam reveals no deformity or inflammation. Nasal mucosa are pink and moist. No lesions or exudates noted.   Mouth: Oral mucosa and oropharynx reveal no lesions or exudates. Teeth in fair - poor repair. Neck: No deformities, masses, or tenderness noted. Range of motion & Thyroid normal. Lungs: Normal respiratory effort; chest expands symmetrically. Lungs are clear to auscultation without rales, wheezes, or increased work of breathing. Heart: Normal rate and rhythm. Normal S1 and S2. No gallop, click, or rub.No murmur. Abdomen: Bowel sounds normal; abdomen soft and nontender. No masses, organomegaly or hernias noted. Genitalia: Genitalia normal except for left varices. Prostate is minimally asymmetric; no enlargement,  nodularity, or induration.                                Musculoskeletal/extremities: No deformity or scoliosis noted of  the thoracic or lumbar spine.   No clubbing, cyanosis, edema, or significant extremity  deformity noted. Range of motion normal .Tone & strength  Normal. Joints  reveal minor  DJD DIP changes. Toenail health poor. Able to lie down & sit up w/o help. Negative SLR bilaterally Vascular: Carotid, radial artery, dorsalis pedis and  posterior tibial pulses are  Equal but pedal pulses decreased. No bruits present. Neurologic: Alert and oriented x3. Deep tendon reflexes symmetrical and normal.   Light touch normal over feet.       Skin: Intact without suspicious lesions or rashes. Lymph: No cervical, axillary, or inguinal lymphadenopathy present. Psych: Mood and affect are normal. Normally interactive  Assessment & Plan:  #1 Medicare Wellness Exam; criteria met ; data entered #2 Problem List/Diagnoses reviewed Plan:   Assessments made/ Orders entered

## 2013-07-06 NOTE — Patient Instructions (Signed)
As per the Standard of Care , screening Colonoscopy recommended @ 50 & every 5-10 years thereafter . More frequent monitor would be dictated by family history or findings @ Colonoscopy. Caries ( cavities) and plaque on the teeth can lead to significant local and systemic infections with threat to your health.Please have dental care completed as soon as possible. Your next office appointment will be determined based upon review of your pending labs . Those instructions will be transmitted to you through My Chart .

## 2013-07-07 LAB — CBC WITH DIFFERENTIAL/PLATELET
Basophils Relative: 0.7 % (ref 0.0–3.0)
Eosinophils Absolute: 0.1 10*3/uL (ref 0.0–0.7)
Eosinophils Relative: 0.8 % (ref 0.0–5.0)
Lymphocytes Relative: 17.9 % (ref 12.0–46.0)
MCHC: 34.2 g/dL (ref 30.0–36.0)
MCV: 86.1 fl (ref 78.0–100.0)
Monocytes Relative: 6 % (ref 3.0–12.0)
Neutrophils Relative %: 74.6 % (ref 43.0–77.0)
Platelets: 198 10*3/uL (ref 150.0–400.0)
RBC: 4.5 Mil/uL (ref 4.22–5.81)
RDW: 14.6 % (ref 11.5–14.6)
WBC: 9.6 10*3/uL (ref 4.5–10.5)

## 2013-07-07 LAB — LIPID PANEL
Cholesterol: 184 mg/dL (ref 0–200)
HDL: 39.5 mg/dL (ref >39.00)
LDL Cholesterol: 118 mg/dL — ABNORMAL HIGH (ref 0–99)
Triglycerides: 132 mg/dL (ref 0.0–149.0)
VLDL: 26.4 mg/dL (ref 0.0–40.0)

## 2013-07-07 LAB — MICROALBUMIN / CREATININE URINE RATIO: Microalb, Ur: 3.5 mg/dL — ABNORMAL HIGH (ref 0.0–1.9)

## 2013-07-07 LAB — BASIC METABOLIC PANEL
BUN: 15 mg/dL (ref 6–23)
Calcium: 9.2 mg/dL (ref 8.4–10.5)
Chloride: 99 mEq/L (ref 96–112)
Creatinine, Ser: 1.2 mg/dL (ref 0.4–1.5)
GFR: 63.55 mL/min (ref >60.00)
Glucose, Bld: 120 mg/dL — ABNORMAL HIGH (ref 70–99)
Potassium: 4.5 mEq/L (ref 3.5–5.1)

## 2013-07-07 LAB — HEPATIC FUNCTION PANEL
AST: 18 U/L (ref 0–37)
Albumin: 4.1 g/dL (ref 3.5–5.2)
Alkaline Phosphatase: 54 U/L (ref 39–117)
Bilirubin, Direct: 0 mg/dL (ref 0.0–0.3)
Total Bilirubin: 0.7 mg/dL (ref 0.3–1.2)

## 2013-07-07 LAB — HEMOGLOBIN A1C: Hgb A1c MFr Bld: 7.3 % — ABNORMAL HIGH (ref 4.6–6.5)

## 2013-07-07 LAB — TSH: TSH: 1.56 u[IU]/mL (ref 0.35–5.50)

## 2013-07-07 LAB — VITAMIN B12: Vitamin B-12: 498 pg/mL (ref 211–911)

## 2013-07-08 ENCOUNTER — Other Ambulatory Visit: Payer: Self-pay | Admitting: *Deleted

## 2013-07-08 ENCOUNTER — Other Ambulatory Visit: Payer: Self-pay | Admitting: Internal Medicine

## 2013-07-08 ENCOUNTER — Encounter (HOSPITAL_COMMUNITY): Payer: Medicare Other

## 2013-07-08 DIAGNOSIS — R0989 Other specified symptoms and signs involving the circulatory and respiratory systems: Secondary | ICD-10-CM

## 2013-07-08 NOTE — Telephone Encounter (Signed)
Lisinopril refill sent to pharmacy 

## 2013-07-09 ENCOUNTER — Other Ambulatory Visit: Payer: Self-pay | Admitting: Internal Medicine

## 2013-07-09 DIAGNOSIS — E782 Mixed hyperlipidemia: Secondary | ICD-10-CM

## 2013-07-13 ENCOUNTER — Ambulatory Visit (HOSPITAL_COMMUNITY): Payer: Medicare Other | Attending: Internal Medicine

## 2013-07-13 ENCOUNTER — Other Ambulatory Visit: Payer: Self-pay | Admitting: Internal Medicine

## 2013-07-13 DIAGNOSIS — E119 Type 2 diabetes mellitus without complications: Secondary | ICD-10-CM | POA: Insufficient documentation

## 2013-07-13 DIAGNOSIS — I739 Peripheral vascular disease, unspecified: Secondary | ICD-10-CM | POA: Diagnosis not present

## 2013-07-13 DIAGNOSIS — E785 Hyperlipidemia, unspecified: Secondary | ICD-10-CM | POA: Diagnosis not present

## 2013-07-13 DIAGNOSIS — R0989 Other specified symptoms and signs involving the circulatory and respiratory systems: Secondary | ICD-10-CM | POA: Insufficient documentation

## 2013-07-13 DIAGNOSIS — I10 Essential (primary) hypertension: Secondary | ICD-10-CM | POA: Insufficient documentation

## 2013-07-13 NOTE — Telephone Encounter (Signed)
Metformin refill sent to pharmacy

## 2013-07-20 ENCOUNTER — Ambulatory Visit (INDEPENDENT_AMBULATORY_CARE_PROVIDER_SITE_OTHER): Payer: Medicare Other | Admitting: *Deleted

## 2013-07-20 DIAGNOSIS — E538 Deficiency of other specified B group vitamins: Secondary | ICD-10-CM | POA: Diagnosis not present

## 2013-07-20 MED ORDER — CYANOCOBALAMIN 1000 MCG/ML IJ SOLN
1000.0000 ug | Freq: Once | INTRAMUSCULAR | Status: AC
  Administered 2013-07-20: 1000 ug via INTRAMUSCULAR

## 2013-08-01 ENCOUNTER — Other Ambulatory Visit: Payer: Self-pay | Admitting: Internal Medicine

## 2013-08-01 NOTE — Telephone Encounter (Signed)
Clonidine refilled per protocol 

## 2013-08-22 ENCOUNTER — Ambulatory Visit (INDEPENDENT_AMBULATORY_CARE_PROVIDER_SITE_OTHER): Payer: Medicare Other | Admitting: *Deleted

## 2013-08-22 DIAGNOSIS — E538 Deficiency of other specified B group vitamins: Secondary | ICD-10-CM

## 2013-08-22 MED ORDER — CYANOCOBALAMIN 1000 MCG/ML IJ SOLN
1000.0000 ug | Freq: Once | INTRAMUSCULAR | Status: AC
  Administered 2013-08-22: 1000 ug via INTRAMUSCULAR

## 2013-09-12 ENCOUNTER — Other Ambulatory Visit: Payer: Self-pay | Admitting: Internal Medicine

## 2013-09-20 ENCOUNTER — Ambulatory Visit (INDEPENDENT_AMBULATORY_CARE_PROVIDER_SITE_OTHER): Payer: Medicare Other | Admitting: *Deleted

## 2013-09-20 DIAGNOSIS — E538 Deficiency of other specified B group vitamins: Secondary | ICD-10-CM

## 2013-09-20 MED ORDER — CYANOCOBALAMIN 1000 MCG/ML IJ SOLN
1000.0000 ug | Freq: Once | INTRAMUSCULAR | Status: AC
  Administered 2013-09-20: 1000 ug via INTRAMUSCULAR

## 2013-10-20 ENCOUNTER — Ambulatory Visit (INDEPENDENT_AMBULATORY_CARE_PROVIDER_SITE_OTHER): Payer: Medicare Other | Admitting: *Deleted

## 2013-10-20 DIAGNOSIS — D518 Other vitamin B12 deficiency anemias: Secondary | ICD-10-CM

## 2013-10-20 DIAGNOSIS — D519 Vitamin B12 deficiency anemia, unspecified: Secondary | ICD-10-CM

## 2013-10-20 MED ORDER — CYANOCOBALAMIN 1000 MCG/ML IJ SOLN
1000.0000 ug | Freq: Once | INTRAMUSCULAR | Status: AC
  Administered 2013-10-20: 1000 ug via INTRAMUSCULAR

## 2013-11-28 ENCOUNTER — Other Ambulatory Visit: Payer: Self-pay | Admitting: Internal Medicine

## 2013-12-01 DIAGNOSIS — E11319 Type 2 diabetes mellitus with unspecified diabetic retinopathy without macular edema: Secondary | ICD-10-CM | POA: Diagnosis not present

## 2013-12-01 DIAGNOSIS — H2510 Age-related nuclear cataract, unspecified eye: Secondary | ICD-10-CM | POA: Diagnosis not present

## 2013-12-01 DIAGNOSIS — H25019 Cortical age-related cataract, unspecified eye: Secondary | ICD-10-CM | POA: Diagnosis not present

## 2013-12-01 DIAGNOSIS — E119 Type 2 diabetes mellitus without complications: Secondary | ICD-10-CM | POA: Diagnosis not present

## 2013-12-01 DIAGNOSIS — H524 Presbyopia: Secondary | ICD-10-CM | POA: Diagnosis not present

## 2013-12-01 DIAGNOSIS — H40019 Open angle with borderline findings, low risk, unspecified eye: Secondary | ICD-10-CM | POA: Diagnosis not present

## 2013-12-06 ENCOUNTER — Ambulatory Visit (INDEPENDENT_AMBULATORY_CARE_PROVIDER_SITE_OTHER): Payer: Medicare Other

## 2013-12-06 DIAGNOSIS — E538 Deficiency of other specified B group vitamins: Secondary | ICD-10-CM

## 2013-12-06 MED ORDER — CYANOCOBALAMIN 1000 MCG/ML IJ SOLN
1000.0000 ug | Freq: Once | INTRAMUSCULAR | Status: AC
  Administered 2013-12-06: 1000 ug via INTRAMUSCULAR

## 2013-12-28 ENCOUNTER — Encounter: Payer: Self-pay | Admitting: Internal Medicine

## 2014-01-05 ENCOUNTER — Other Ambulatory Visit: Payer: Self-pay | Admitting: Internal Medicine

## 2014-01-11 ENCOUNTER — Other Ambulatory Visit: Payer: Self-pay | Admitting: Internal Medicine

## 2014-01-25 ENCOUNTER — Ambulatory Visit (INDEPENDENT_AMBULATORY_CARE_PROVIDER_SITE_OTHER): Payer: Medicare Other | Admitting: *Deleted

## 2014-01-25 DIAGNOSIS — E538 Deficiency of other specified B group vitamins: Secondary | ICD-10-CM

## 2014-01-25 MED ORDER — CYANOCOBALAMIN 1000 MCG/ML IJ SOLN
1000.0000 ug | Freq: Once | INTRAMUSCULAR | Status: AC
Start: 2014-01-25 — End: 2014-01-25
  Administered 2014-01-25: 1000 ug via INTRAMUSCULAR

## 2014-01-27 ENCOUNTER — Other Ambulatory Visit: Payer: Self-pay | Admitting: Internal Medicine

## 2014-02-27 ENCOUNTER — Telehealth: Payer: Self-pay | Admitting: *Deleted

## 2014-02-27 NOTE — Telephone Encounter (Signed)
Caller name:  Izaan Relation to pt:  self Call back number: [phone]  Pharmacy:  Reason for call:   Pt called requesting appt for B12 this Thursday morning, 03/02/2014.  Please advise.  bw

## 2014-02-27 NOTE — Telephone Encounter (Signed)
Spoke wit patient and made aware that b-12 shot is scheduled for Tur am at 9:15

## 2014-03-02 ENCOUNTER — Ambulatory Visit (INDEPENDENT_AMBULATORY_CARE_PROVIDER_SITE_OTHER): Payer: Medicare Other | Admitting: *Deleted

## 2014-03-02 DIAGNOSIS — E538 Deficiency of other specified B group vitamins: Secondary | ICD-10-CM | POA: Diagnosis not present

## 2014-03-02 MED ORDER — CYANOCOBALAMIN 1000 MCG/ML IJ SOLN
1000.0000 ug | Freq: Once | INTRAMUSCULAR | Status: AC
  Administered 2014-03-02: 1000 ug via INTRAMUSCULAR

## 2014-03-02 NOTE — Progress Notes (Signed)
Pt come in for monthly B12 injection.  Pt tolerated injection well.  Pt will schedule for next B12 injection in 1 month.//AB/CMA

## 2014-03-08 ENCOUNTER — Other Ambulatory Visit: Payer: Self-pay | Admitting: Internal Medicine

## 2014-04-06 ENCOUNTER — Ambulatory Visit (INDEPENDENT_AMBULATORY_CARE_PROVIDER_SITE_OTHER): Payer: Medicare Other | Admitting: *Deleted

## 2014-04-06 DIAGNOSIS — E538 Deficiency of other specified B group vitamins: Secondary | ICD-10-CM | POA: Diagnosis not present

## 2014-04-06 MED ORDER — CYANOCOBALAMIN 1000 MCG/ML IJ SOLN
1000.0000 ug | Freq: Once | INTRAMUSCULAR | Status: AC
  Administered 2014-04-06: 1000 ug via INTRAMUSCULAR

## 2014-04-07 ENCOUNTER — Other Ambulatory Visit: Payer: Self-pay | Admitting: Internal Medicine

## 2014-05-09 ENCOUNTER — Ambulatory Visit (INDEPENDENT_AMBULATORY_CARE_PROVIDER_SITE_OTHER): Payer: Medicare Other

## 2014-05-09 DIAGNOSIS — E538 Deficiency of other specified B group vitamins: Secondary | ICD-10-CM

## 2014-05-09 MED ORDER — CYANOCOBALAMIN 1000 MCG/ML IJ SOLN
1000.0000 ug | Freq: Once | INTRAMUSCULAR | Status: AC
  Administered 2014-05-09: 1000 ug via INTRAMUSCULAR

## 2014-05-23 ENCOUNTER — Other Ambulatory Visit: Payer: Self-pay | Admitting: Internal Medicine

## 2014-06-23 ENCOUNTER — Ambulatory Visit (INDEPENDENT_AMBULATORY_CARE_PROVIDER_SITE_OTHER): Payer: Medicare Other

## 2014-06-23 DIAGNOSIS — E538 Deficiency of other specified B group vitamins: Secondary | ICD-10-CM | POA: Diagnosis not present

## 2014-06-23 DIAGNOSIS — Z23 Encounter for immunization: Secondary | ICD-10-CM | POA: Diagnosis not present

## 2014-06-23 MED ORDER — CYANOCOBALAMIN 1000 MCG/ML IJ SOLN
1000.0000 ug | Freq: Once | INTRAMUSCULAR | Status: AC
  Administered 2014-06-23: 1000 ug via INTRAMUSCULAR

## 2014-07-04 ENCOUNTER — Other Ambulatory Visit: Payer: Self-pay | Admitting: Internal Medicine

## 2014-07-07 ENCOUNTER — Encounter: Payer: Self-pay | Admitting: Internal Medicine

## 2014-07-10 ENCOUNTER — Encounter: Payer: Self-pay | Admitting: Internal Medicine

## 2014-07-10 NOTE — Telephone Encounter (Signed)
Patient given Januvia 100 mg samples Lot [id number] Exp Feb 2018 total of 42 tablets. Patient is to check with his insurance to see if covered

## 2014-07-14 ENCOUNTER — Other Ambulatory Visit: Payer: Self-pay | Admitting: Internal Medicine

## 2014-07-17 ENCOUNTER — Encounter: Payer: Self-pay | Admitting: Internal Medicine

## 2014-07-17 ENCOUNTER — Other Ambulatory Visit: Payer: Self-pay | Admitting: Internal Medicine

## 2014-07-17 MED ORDER — METFORMIN HCL 1000 MG PO TABS: ORAL_TABLET | ORAL | Status: DC

## 2014-07-31 ENCOUNTER — Other Ambulatory Visit: Payer: Self-pay | Admitting: Internal Medicine

## 2014-08-17 ENCOUNTER — Ambulatory Visit (INDEPENDENT_AMBULATORY_CARE_PROVIDER_SITE_OTHER): Payer: Medicare Other | Admitting: *Deleted

## 2014-08-17 DIAGNOSIS — E538 Deficiency of other specified B group vitamins: Secondary | ICD-10-CM | POA: Diagnosis not present

## 2014-08-17 MED ORDER — CYANOCOBALAMIN 1000 MCG/ML IJ SOLN
1000.0000 ug | Freq: Once | INTRAMUSCULAR | Status: AC
  Administered 2014-08-17: 1000 ug via INTRAMUSCULAR

## 2014-09-04 ENCOUNTER — Other Ambulatory Visit: Payer: Self-pay | Admitting: Internal Medicine

## 2014-09-11 ENCOUNTER — Encounter: Payer: Self-pay | Admitting: Internal Medicine

## 2014-09-11 ENCOUNTER — Other Ambulatory Visit: Payer: Self-pay | Admitting: Internal Medicine

## 2014-09-11 DIAGNOSIS — I10 Essential (primary) hypertension: Secondary | ICD-10-CM

## 2014-09-11 DIAGNOSIS — E782 Mixed hyperlipidemia: Secondary | ICD-10-CM

## 2014-09-11 DIAGNOSIS — IMO0002 Reserved for concepts with insufficient information to code with codable children: Secondary | ICD-10-CM

## 2014-09-11 DIAGNOSIS — E1165 Type 2 diabetes mellitus with hyperglycemia: Principal | ICD-10-CM

## 2014-09-11 DIAGNOSIS — E1151 Type 2 diabetes mellitus with diabetic peripheral angiopathy without gangrene: Secondary | ICD-10-CM

## 2014-09-20 ENCOUNTER — Ambulatory Visit (INDEPENDENT_AMBULATORY_CARE_PROVIDER_SITE_OTHER): Payer: Medicare Other | Admitting: *Deleted

## 2014-09-20 DIAGNOSIS — E538 Deficiency of other specified B group vitamins: Secondary | ICD-10-CM

## 2014-09-20 MED ORDER — CYANOCOBALAMIN 1000 MCG/ML IJ SOLN
1000.0000 ug | Freq: Once | INTRAMUSCULAR | Status: AC
  Administered 2014-09-20: 1000 ug via INTRAMUSCULAR

## 2014-10-02 ENCOUNTER — Other Ambulatory Visit: Payer: Self-pay | Admitting: Internal Medicine

## 2014-10-16 ENCOUNTER — Encounter: Payer: Medicare Other | Admitting: Internal Medicine

## 2014-10-31 ENCOUNTER — Other Ambulatory Visit: Payer: Self-pay | Admitting: Internal Medicine

## 2014-10-31 ENCOUNTER — Encounter: Payer: Self-pay | Admitting: Internal Medicine

## 2014-11-01 MED ORDER — LISINOPRIL 20 MG PO TABS: ORAL_TABLET | ORAL | Status: DC

## 2014-11-02 ENCOUNTER — Ambulatory Visit (INDEPENDENT_AMBULATORY_CARE_PROVIDER_SITE_OTHER): Payer: Medicare Other | Admitting: *Deleted

## 2014-11-02 DIAGNOSIS — E538 Deficiency of other specified B group vitamins: Secondary | ICD-10-CM

## 2014-11-02 MED ORDER — CYANOCOBALAMIN 1000 MCG/ML IJ SOLN
1000.0000 ug | Freq: Once | INTRAMUSCULAR | Status: AC
  Administered 2014-11-02: 1000 ug via INTRAMUSCULAR

## 2014-11-07 ENCOUNTER — Encounter: Payer: Self-pay | Admitting: Internal Medicine

## 2014-11-07 ENCOUNTER — Other Ambulatory Visit: Payer: Self-pay | Admitting: Internal Medicine

## 2014-11-07 MED ORDER — JANUVIA 100 MG PO TABS: ORAL_TABLET | ORAL | Status: DC

## 2014-11-14 ENCOUNTER — Encounter: Payer: Medicare Other | Admitting: Internal Medicine

## 2014-11-17 ENCOUNTER — Encounter: Payer: Self-pay | Admitting: Internal Medicine

## 2014-11-17 ENCOUNTER — Other Ambulatory Visit: Payer: Self-pay | Admitting: *Deleted

## 2014-11-17 ENCOUNTER — Other Ambulatory Visit: Payer: Self-pay | Admitting: Internal Medicine

## 2014-11-17 MED ORDER — CARVEDILOL 12.5 MG PO TABS: ORAL_TABLET | ORAL | Status: DC

## 2014-11-17 NOTE — Telephone Encounter (Signed)
Sent mychart msg stating needing refills on carvedilol have appt 12/19/14. Sent 30 day supply until appt...Alex Church

## 2014-12-04 ENCOUNTER — Other Ambulatory Visit: Payer: Self-pay | Admitting: Internal Medicine

## 2014-12-06 ENCOUNTER — Ambulatory Visit (INDEPENDENT_AMBULATORY_CARE_PROVIDER_SITE_OTHER): Payer: Medicare Other | Admitting: *Deleted

## 2014-12-06 DIAGNOSIS — E538 Deficiency of other specified B group vitamins: Secondary | ICD-10-CM | POA: Diagnosis not present

## 2014-12-06 MED ORDER — CYANOCOBALAMIN 1000 MCG/ML IJ SOLN
1000.0000 ug | Freq: Once | INTRAMUSCULAR | Status: AC
  Administered 2014-12-06: 1000 ug via INTRAMUSCULAR

## 2014-12-11 ENCOUNTER — Encounter: Payer: Self-pay | Admitting: Internal Medicine

## 2014-12-11 ENCOUNTER — Other Ambulatory Visit: Payer: Self-pay | Admitting: Internal Medicine

## 2014-12-11 MED ORDER — JANUVIA 100 MG PO TABS: ORAL_TABLET | ORAL | Status: DC

## 2014-12-12 ENCOUNTER — Encounter: Payer: Medicare Other | Admitting: Internal Medicine

## 2014-12-18 ENCOUNTER — Other Ambulatory Visit: Payer: Self-pay | Admitting: Internal Medicine

## 2014-12-18 ENCOUNTER — Encounter: Payer: Self-pay | Admitting: Internal Medicine

## 2014-12-18 MED ORDER — CARVEDILOL 12.5 MG PO TABS: ORAL_TABLET | ORAL | Status: DC

## 2014-12-18 NOTE — Telephone Encounter (Signed)
Med refilled.

## 2014-12-19 ENCOUNTER — Encounter: Payer: Medicare Other | Admitting: Internal Medicine

## 2015-01-04 ENCOUNTER — Other Ambulatory Visit (INDEPENDENT_AMBULATORY_CARE_PROVIDER_SITE_OTHER): Payer: Medicare Other

## 2015-01-04 ENCOUNTER — Ambulatory Visit (INDEPENDENT_AMBULATORY_CARE_PROVIDER_SITE_OTHER): Payer: Medicare Other | Admitting: Internal Medicine

## 2015-01-04 ENCOUNTER — Encounter: Payer: Self-pay | Admitting: Internal Medicine

## 2015-01-04 VITALS — BP 126/78 | HR 69 | Temp 97.4°F | Ht 72.0 in | Wt 201.2 lb

## 2015-01-04 DIAGNOSIS — E782 Mixed hyperlipidemia: Secondary | ICD-10-CM | POA: Diagnosis not present

## 2015-01-04 DIAGNOSIS — E042 Nontoxic multinodular goiter: Secondary | ICD-10-CM | POA: Diagnosis not present

## 2015-01-04 DIAGNOSIS — E1151 Type 2 diabetes mellitus with diabetic peripheral angiopathy without gangrene: Secondary | ICD-10-CM

## 2015-01-04 DIAGNOSIS — E1165 Type 2 diabetes mellitus with hyperglycemia: Secondary | ICD-10-CM

## 2015-01-04 DIAGNOSIS — I10 Essential (primary) hypertension: Secondary | ICD-10-CM | POA: Diagnosis not present

## 2015-01-04 DIAGNOSIS — E538 Deficiency of other specified B group vitamins: Secondary | ICD-10-CM | POA: Diagnosis not present

## 2015-01-04 DIAGNOSIS — E1159 Type 2 diabetes mellitus with other circulatory complications: Secondary | ICD-10-CM | POA: Diagnosis not present

## 2015-01-04 DIAGNOSIS — IMO0002 Reserved for concepts with insufficient information to code with codable children: Secondary | ICD-10-CM

## 2015-01-04 DIAGNOSIS — N429 Disorder of prostate, unspecified: Secondary | ICD-10-CM

## 2015-01-04 LAB — LIPID PANEL
CHOLESTEROL: 177 mg/dL (ref 0–200)
HDL: 36.4 mg/dL — AB (ref >39.00)
LDL Cholesterol: 112 mg/dL — ABNORMAL HIGH (ref 0–99)
NONHDL: 140.6
Total CHOL/HDL Ratio: 5
Triglycerides: 145 mg/dL (ref 0.0–149.0)
VLDL: 29 mg/dL (ref 0.0–40.0)

## 2015-01-04 LAB — HEPATIC FUNCTION PANEL
ALT: 15 U/L (ref 0–53)
AST: 18 U/L (ref 0–37)
Albumin: 4.3 g/dL (ref 3.5–5.2)
Alkaline Phosphatase: 51 U/L (ref 39–117)
BILIRUBIN TOTAL: 0.4 mg/dL (ref 0.2–1.2)
Bilirubin, Direct: 0.1 mg/dL (ref 0.0–0.3)
Total Protein: 6.9 g/dL (ref 6.0–8.3)

## 2015-01-04 LAB — CBC WITH DIFFERENTIAL/PLATELET
BASOS ABS: 0 10*3/uL (ref 0.0–0.1)
Basophils Relative: 0.4 % (ref 0.0–3.0)
Eosinophils Absolute: 0.2 10*3/uL (ref 0.0–0.7)
Eosinophils Relative: 1.9 % (ref 0.0–5.0)
HEMATOCRIT: 40.2 % (ref 39.0–52.0)
Hemoglobin: 13.5 g/dL (ref 13.0–17.0)
Lymphocytes Relative: 19.1 % (ref 12.0–46.0)
Lymphs Abs: 1.8 10*3/uL (ref 0.7–4.0)
MCHC: 33.6 g/dL (ref 30.0–36.0)
MCV: 87.1 fl (ref 78.0–100.0)
MONOS PCT: 6.5 % (ref 3.0–12.0)
Monocytes Absolute: 0.6 10*3/uL (ref 0.1–1.0)
Neutro Abs: 7 10*3/uL (ref 1.4–7.7)
Neutrophils Relative %: 72.1 % (ref 43.0–77.0)
PLATELETS: 202 10*3/uL (ref 150.0–400.0)
RBC: 4.62 Mil/uL (ref 4.22–5.81)
RDW: 14.5 % (ref 11.5–15.5)
WBC: 9.6 10*3/uL (ref 4.0–10.5)

## 2015-01-04 LAB — PSA: PSA: 1.61 ng/mL (ref 0.10–4.00)

## 2015-01-04 LAB — MICROALBUMIN / CREATININE URINE RATIO
Creatinine,U: 230.5 mg/dL
MICROALB UR: 1.9 mg/dL (ref 0.0–1.9)
Microalb Creat Ratio: 0.8 mg/g (ref 0.0–30.0)

## 2015-01-04 LAB — TSH: TSH: 0.92 u[IU]/mL (ref 0.35–4.50)

## 2015-01-04 LAB — BASIC METABOLIC PANEL
BUN: 18 mg/dL (ref 6–23)
CO2: 26 mEq/L (ref 19–32)
CREATININE: 1.02 mg/dL (ref 0.40–1.50)
Calcium: 9.2 mg/dL (ref 8.4–10.5)
Chloride: 102 mEq/L (ref 96–112)
GFR: 77.05 mL/min (ref >60.00)
Glucose, Bld: 145 mg/dL — ABNORMAL HIGH (ref 70–99)
Potassium: 4.6 mEq/L (ref 3.5–5.1)
Sodium: 136 mEq/L (ref 135–145)

## 2015-01-04 LAB — VITAMIN B12: VITAMIN B 12: 740 pg/mL (ref 211–911)

## 2015-01-04 LAB — HEMOGLOBIN A1C: HEMOGLOBIN A1C: 7.3 % — AB (ref 4.6–6.5)

## 2015-01-04 LAB — T4, FREE: FREE T4: 0.93 ng/dL (ref 0.60–1.60)

## 2015-01-04 NOTE — Progress Notes (Signed)
Subjective:    Patient ID: Alex Church, male    DOB: July 04, 1946, 69 y.o.   MRN: [id number]  HPI The patient is here to assess status of active health conditions.His last A1c was 7.3% in Oct 2014.Appointment cancelled twice." Short handed @ work and my dog blew out his ACL".  PMH, FH, & Social History reviewed & updated.  Diet/ nutrition:no program Exercise program:no due to plantar fasciitis X1 mo  HYPERTENSION: Disease Monitoring: Blood pressure range/ average :130/78-80 Medication Compliance:yes   Diabetes :  FBS range/average: 160-190 Highest 2 hr post meal glucose:not monitored Medication compliance:yes Hypoglycemia:no Ophthamology care:appointment 01/08/15 Podiatry care:not UTD  HYPERLIPIDEMIA: Disease Monitoring: Medication Compliance:no statin  No colonoscopy to date; standard of care & risk  discussed ."I just can't make myself do it". No significant GI symptoms.   Review of Systems  Chest pain, palpitations: no      Dyspnea:no Edema:no Claudication: no Lightheadedness,Syncope:no Weight gain/loss:stable Polyuria/phagia/dipsia: no  Blurred vision /diplopia/lossof vision:no Limb numbness/tingling/burning:no Non healing skin lesions:no GI: occasional loose stool.Unexplained weight loss, abdominal pain, significant dyspepsia, dysphagia, melena, rectal bleeding, or persistently small caliber stools are denied. Myalgias: no but pain in fingers several months.NSAIDS & Tylenol Arthritis did not help Memory loss:no        Objective:   Physical Exam Gen.: Adequately nourished in appearance. Alert, appropriate and cooperative throughout exam. BMI: Appears younger than stated age  Head: Normocephalic without obvious abnormalities;no alopecia  Eyes: No corneal or conjunctival inflammation noted. Pupils equal round reactive to light and accommodation. Extraocular motion intact.  Ears: External  ear exam reveals no significant lesions or deformities. Wax in canal ,  L> R. Poor TMs visualizatiion. Hearing is grossly normal bilaterally. Nose: External nasal exam reveals no deformity or inflammation. Nasal mucosa are pink and moist. No lesions or exudates noted.   Mouth: Oral mucosa and oropharynx reveal no lesions or exudates. Teeth in good repair. Neck: No deformities, masses, or tenderness noted. Range of motion & Thyroid normal. Lungs: Normal respiratory effort; chest expands symmetrically. Lungs are clear to auscultation without rales, wheezes, or increased work of breathing. Heart: Normal rate and rhythm. Normal S1 and S2. No gallop, click, or rub. No murmur. Abdomen: Bowel sounds normal; abdomen soft and nontender. No masses, organomegaly or hernias noted. Genitalia: Genitalia normal except for left varices. Prostate is asymmetrically enlarged w/o nodularity or induration              Musculoskeletal/extremities: No deformity or scoliosis noted of  the thoracic or lumbar spine.  No clubbing, cyanosis, edema, or significant extremity  deformity noted. Pes planus. Range of motion normal . Tone & strength normal. Hand joints reveal minor isolated  DJD DIP changes.  Fingernail  health good. Able to lie down & sit up w/o help.  Negative SLR bilaterally Vascular: Carotid, radial artery, dorsalis pedis and  posterior tibial pulses are equal. Decreased pedal pulses.No bruits present. Neurologic: Alert and oriented x3. Deep tendon reflexes symmetrical but 1/2 + @ knees. Gait normal       Skin: Intact without suspicious lesions or rashes. Lymph: No cervical, axillary, or inguinal lymphadenopathy present. Psych: Mood and affect are normal. Normally interactive  Assessment & Plan:  See Current Assessment & Plan in Problem List under specific Diagnosis

## 2015-01-04 NOTE — Progress Notes (Signed)
Pre visit review using our clinic review tool, if applicable. No additional management support is needed unless otherwise documented below in the visit note. 

## 2015-01-04 NOTE — Patient Instructions (Signed)
Roll the affected foot over a tennis ball 20 times twice a day. After this soak the foot in warm Epsom salts for 15-20 minutes. Wear arch supports in both shoes. Podiatry referral if symptoms persist.  A heated paraffin wax bath can be used if arthritis of the hands is the major issue.  As per the Standard of Care , screening Colonoscopy recommended @ 50 & every 5-10 years thereafter . More frequent monitor would be dictated by family history or findings @ Colonoscopy   Your next office appointment will be determined based upon review of your pending labs .Those instructions will be transmitted to you by My Chart Critical results will be called. Followup as needed for any active or acute issue. Please report any significant change in your symptoms.

## 2015-01-06 ENCOUNTER — Other Ambulatory Visit: Payer: Self-pay | Admitting: Internal Medicine

## 2015-01-06 NOTE — Assessment & Plan Note (Signed)
A1c , urine microalbumin, BMET 

## 2015-01-06 NOTE — Assessment & Plan Note (Signed)
Serum B12 level

## 2015-01-06 NOTE — Assessment & Plan Note (Signed)
Lipids, LFTs, TSH  

## 2015-01-06 NOTE — Assessment & Plan Note (Signed)
Blood pressure goals reviewed. BMET 

## 2015-01-07 ENCOUNTER — Encounter: Payer: Self-pay | Admitting: Internal Medicine

## 2015-01-08 ENCOUNTER — Other Ambulatory Visit: Payer: Self-pay | Admitting: Internal Medicine

## 2015-01-08 DIAGNOSIS — H2513 Age-related nuclear cataract, bilateral: Secondary | ICD-10-CM | POA: Diagnosis not present

## 2015-01-08 DIAGNOSIS — H40013 Open angle with borderline findings, low risk, bilateral: Secondary | ICD-10-CM | POA: Diagnosis not present

## 2015-01-08 DIAGNOSIS — H25043 Posterior subcapsular polar age-related cataract, bilateral: Secondary | ICD-10-CM | POA: Diagnosis not present

## 2015-01-08 DIAGNOSIS — E119 Type 2 diabetes mellitus without complications: Secondary | ICD-10-CM | POA: Diagnosis not present

## 2015-01-08 LAB — HM DIABETES EYE EXAM

## 2015-01-11 ENCOUNTER — Ambulatory Visit (INDEPENDENT_AMBULATORY_CARE_PROVIDER_SITE_OTHER): Payer: Medicare Other | Admitting: *Deleted

## 2015-01-11 DIAGNOSIS — E538 Deficiency of other specified B group vitamins: Secondary | ICD-10-CM

## 2015-01-11 MED ORDER — CYANOCOBALAMIN 1000 MCG/ML IJ SOLN
1000.0000 ug | Freq: Once | INTRAMUSCULAR | Status: AC
  Administered 2015-01-11: 1000 ug via INTRAMUSCULAR

## 2015-01-12 ENCOUNTER — Other Ambulatory Visit: Payer: Self-pay | Admitting: Internal Medicine

## 2015-01-16 ENCOUNTER — Other Ambulatory Visit: Payer: Self-pay | Admitting: Internal Medicine

## 2015-02-01 ENCOUNTER — Encounter: Payer: Self-pay | Admitting: Internal Medicine

## 2015-02-22 ENCOUNTER — Ambulatory Visit (INDEPENDENT_AMBULATORY_CARE_PROVIDER_SITE_OTHER): Payer: Medicare Other | Admitting: *Deleted

## 2015-02-22 DIAGNOSIS — E538 Deficiency of other specified B group vitamins: Secondary | ICD-10-CM | POA: Diagnosis not present

## 2015-02-22 MED ORDER — CYANOCOBALAMIN 1000 MCG/ML IJ SOLN
1000.0000 ug | Freq: Once | INTRAMUSCULAR | Status: AC
  Administered 2015-02-22: 1000 ug via INTRAMUSCULAR

## 2015-04-05 ENCOUNTER — Ambulatory Visit (INDEPENDENT_AMBULATORY_CARE_PROVIDER_SITE_OTHER): Payer: Medicare Other

## 2015-04-05 DIAGNOSIS — E538 Deficiency of other specified B group vitamins: Secondary | ICD-10-CM

## 2015-04-05 MED ORDER — CYANOCOBALAMIN 1000 MCG/ML IJ SOLN
1000.0000 ug | Freq: Once | INTRAMUSCULAR | Status: AC
  Administered 2015-04-05: 1000 ug via SUBCUTANEOUS

## 2015-04-16 DIAGNOSIS — L309 Dermatitis, unspecified: Secondary | ICD-10-CM | POA: Diagnosis not present

## 2015-04-16 DIAGNOSIS — L821 Other seborrheic keratosis: Secondary | ICD-10-CM | POA: Diagnosis not present

## 2015-04-16 DIAGNOSIS — L308 Other specified dermatitis: Secondary | ICD-10-CM | POA: Diagnosis not present

## 2015-04-16 DIAGNOSIS — L570 Actinic keratosis: Secondary | ICD-10-CM | POA: Diagnosis not present

## 2015-04-16 DIAGNOSIS — L986 Other infiltrative disorders of the skin and subcutaneous tissue: Secondary | ICD-10-CM | POA: Diagnosis not present

## 2015-05-10 ENCOUNTER — Ambulatory Visit (INDEPENDENT_AMBULATORY_CARE_PROVIDER_SITE_OTHER): Payer: Medicare Other

## 2015-05-10 DIAGNOSIS — E538 Deficiency of other specified B group vitamins: Secondary | ICD-10-CM

## 2015-05-11 MED ORDER — CYANOCOBALAMIN 1000 MCG/ML IJ SOLN
1000.0000 ug | Freq: Once | INTRAMUSCULAR | Status: AC
  Administered 2015-05-10: 1000 ug via INTRAMUSCULAR

## 2015-06-27 ENCOUNTER — Ambulatory Visit (INDEPENDENT_AMBULATORY_CARE_PROVIDER_SITE_OTHER): Payer: Medicare Other

## 2015-06-27 DIAGNOSIS — E538 Deficiency of other specified B group vitamins: Secondary | ICD-10-CM

## 2015-06-27 DIAGNOSIS — Z23 Encounter for immunization: Secondary | ICD-10-CM | POA: Diagnosis not present

## 2015-06-27 MED ORDER — CYANOCOBALAMIN 1000 MCG/ML IJ SOLN
1000.0000 ug | Freq: Once | INTRAMUSCULAR | Status: AC
  Administered 2015-06-27: 1000 ug via INTRAMUSCULAR

## 2015-07-02 DIAGNOSIS — D485 Neoplasm of uncertain behavior of skin: Secondary | ICD-10-CM | POA: Diagnosis not present

## 2015-07-02 DIAGNOSIS — L570 Actinic keratosis: Secondary | ICD-10-CM | POA: Diagnosis not present

## 2015-07-10 ENCOUNTER — Other Ambulatory Visit: Payer: Self-pay | Admitting: Internal Medicine

## 2015-07-11 ENCOUNTER — Other Ambulatory Visit: Payer: Self-pay | Admitting: Emergency Medicine

## 2015-07-11 MED ORDER — LISINOPRIL 20 MG PO TABS: ORAL_TABLET | ORAL | Status: DC

## 2015-07-11 MED ORDER — METFORMIN HCL 1000 MG PO TABS: ORAL_TABLET | ORAL | Status: DC

## 2015-07-11 MED ORDER — JANUVIA 100 MG PO TABS: ORAL_TABLET | ORAL | Status: DC

## 2015-07-18 ENCOUNTER — Other Ambulatory Visit: Payer: Self-pay | Admitting: Internal Medicine

## 2015-08-09 ENCOUNTER — Ambulatory Visit (INDEPENDENT_AMBULATORY_CARE_PROVIDER_SITE_OTHER): Payer: Medicare Other

## 2015-08-09 DIAGNOSIS — E538 Deficiency of other specified B group vitamins: Secondary | ICD-10-CM

## 2015-08-09 MED ORDER — CYANOCOBALAMIN 1000 MCG/ML IJ SOLN
1000.0000 ug | Freq: Once | INTRAMUSCULAR | Status: AC
  Administered 2015-08-09: 1000 ug via INTRAMUSCULAR

## 2015-08-10 ENCOUNTER — Encounter: Payer: Self-pay | Admitting: Internal Medicine

## 2015-08-10 ENCOUNTER — Other Ambulatory Visit: Payer: Self-pay | Admitting: Internal Medicine

## 2015-08-10 MED ORDER — CLONIDINE HCL 0.2 MG PO TABS: ORAL_TABLET | ORAL | Status: DC

## 2015-08-10 NOTE — Telephone Encounter (Signed)
#  60 Needs follow up before additional refills

## 2015-08-10 NOTE — Addendum Note (Signed)
Addended by: Della Goo C on: 08/10/2015 01:10 PM   Modules accepted: Orders

## 2015-08-10 NOTE — Telephone Encounter (Signed)
Please give him # 60 ( 1 month supply) He'll need F/U appt for additional refills.thanks, [employer]

## 2015-08-10 NOTE — Telephone Encounter (Signed)
Ok to refill 

## 2015-09-17 ENCOUNTER — Telehealth: Payer: Self-pay | Admitting: Internal Medicine

## 2015-09-17 DIAGNOSIS — H40013 Open angle with borderline findings, low risk, bilateral: Secondary | ICD-10-CM | POA: Diagnosis not present

## 2015-09-17 NOTE — Telephone Encounter (Signed)
Pharmacy called to f/u on request for clonidine. Dr. Clayborn Heron office fwded to Dr. Larose Kells since he will assuming care in May. Please f/u as needed.

## 2015-09-17 NOTE — Telephone Encounter (Signed)
Please advise 

## 2015-09-17 NOTE — Telephone Encounter (Signed)
Patient is establishing care with dr Larose Kells in may/2017--please review and see if rx is appropriate--dr hopper has retired

## 2015-09-18 ENCOUNTER — Encounter: Payer: Self-pay | Admitting: Internal Medicine

## 2015-09-18 MED ORDER — CLONIDINE HCL 0.2 MG PO TABS: 0.2000 mg | ORAL_TABLET | Freq: Two times a day (BID) | ORAL | Status: DC

## 2015-09-18 NOTE — Telephone Encounter (Signed)
Send one month supply and one refill Also please ask scheduler to call the patient and set up an appointment within a month to get established.

## 2015-09-18 NOTE — Telephone Encounter (Signed)
Clonidine Rx sent, #60 and 1 refill.

## 2015-09-18 NOTE — Telephone Encounter (Signed)
Needs New Patient appt within one month, does not need to wait until May 2017 that way Dr. Larose Kells can continue refilling medications. Thank you.

## 2015-09-18 NOTE — Addendum Note (Signed)
Addended byDamita Dunnings D on: 09/18/2015 02:50 PM   Modules accepted: Orders

## 2015-09-26 DIAGNOSIS — H02826 Cysts of left eye, unspecified eyelid: Secondary | ICD-10-CM | POA: Diagnosis not present

## 2015-10-04 ENCOUNTER — Ambulatory Visit (INDEPENDENT_AMBULATORY_CARE_PROVIDER_SITE_OTHER): Payer: Medicare Other

## 2015-10-04 DIAGNOSIS — E538 Deficiency of other specified B group vitamins: Secondary | ICD-10-CM

## 2015-10-04 MED ORDER — CYANOCOBALAMIN 1000 MCG/ML IJ SOLN
1000.0000 ug | Freq: Once | INTRAMUSCULAR | Status: AC
  Administered 2015-10-04: 1000 ug via INTRAMUSCULAR

## 2015-10-08 DIAGNOSIS — H02824 Cysts of left upper eyelid: Secondary | ICD-10-CM | POA: Diagnosis not present

## 2015-10-08 DIAGNOSIS — H02826 Cysts of left eye, unspecified eyelid: Secondary | ICD-10-CM | POA: Diagnosis not present

## 2015-11-09 ENCOUNTER — Ambulatory Visit (INDEPENDENT_AMBULATORY_CARE_PROVIDER_SITE_OTHER): Payer: Medicare Other

## 2015-11-09 DIAGNOSIS — E538 Deficiency of other specified B group vitamins: Secondary | ICD-10-CM

## 2015-11-09 MED ORDER — CYANOCOBALAMIN 1000 MCG/ML IJ SOLN
1000.0000 ug | Freq: Once | INTRAMUSCULAR | Status: AC
  Administered 2015-11-09: 1000 ug via INTRAMUSCULAR

## 2015-11-29 ENCOUNTER — Telehealth: Payer: Self-pay | Admitting: Internal Medicine

## 2015-12-13 DIAGNOSIS — E119 Type 2 diabetes mellitus without complications: Secondary | ICD-10-CM | POA: Diagnosis not present

## 2015-12-13 DIAGNOSIS — H35032 Hypertensive retinopathy, left eye: Secondary | ICD-10-CM | POA: Diagnosis not present

## 2015-12-13 DIAGNOSIS — H35031 Hypertensive retinopathy, right eye: Secondary | ICD-10-CM | POA: Diagnosis not present

## 2015-12-13 DIAGNOSIS — H25013 Cortical age-related cataract, bilateral: Secondary | ICD-10-CM | POA: Diagnosis not present

## 2015-12-13 DIAGNOSIS — H40013 Open angle with borderline findings, low risk, bilateral: Secondary | ICD-10-CM | POA: Diagnosis not present

## 2015-12-13 DIAGNOSIS — H2513 Age-related nuclear cataract, bilateral: Secondary | ICD-10-CM | POA: Diagnosis not present

## 2015-12-28 ENCOUNTER — Ambulatory Visit (INDEPENDENT_AMBULATORY_CARE_PROVIDER_SITE_OTHER): Payer: Medicare Other

## 2015-12-28 DIAGNOSIS — E538 Deficiency of other specified B group vitamins: Secondary | ICD-10-CM | POA: Diagnosis not present

## 2015-12-28 MED ORDER — CYANOCOBALAMIN 1000 MCG/ML IJ SOLN
1000.0000 ug | Freq: Once | INTRAMUSCULAR | Status: AC
  Administered 2015-12-28: 1000 ug via INTRAMUSCULAR

## 2016-01-04 ENCOUNTER — Other Ambulatory Visit: Payer: Self-pay | Admitting: Internal Medicine

## 2016-01-04 NOTE — Telephone Encounter (Signed)
Routing to patient's new pcp 

## 2016-01-07 ENCOUNTER — Encounter: Payer: Self-pay | Admitting: Internal Medicine

## 2016-01-07 ENCOUNTER — Other Ambulatory Visit: Payer: Self-pay | Admitting: Internal Medicine

## 2016-01-07 MED ORDER — CLONIDINE HCL 0.2 MG PO TABS: 0.2000 mg | ORAL_TABLET | Freq: Two times a day (BID) | ORAL | Status: DC

## 2016-01-07 NOTE — Telephone Encounter (Signed)
Please advise, see 09/18/15 MyChart message.

## 2016-01-22 ENCOUNTER — Other Ambulatory Visit: Payer: Self-pay | Admitting: Internal Medicine

## 2016-01-22 NOTE — Telephone Encounter (Signed)
Pt has not establish w/ Dr. Larose Kells, new patient appt scheduled for 03/2016, if he is needing refills, recommend to call our office to schedule earlier appt.

## 2016-01-22 NOTE — Telephone Encounter (Signed)
Routing to patient's new pcp to handle 

## 2016-01-23 NOTE — Telephone Encounter (Signed)
Since appt has been made for July will send enough until appt w/Dr. Larose Kells...Alex Church

## 2016-01-31 ENCOUNTER — Ambulatory Visit: Payer: Medicare Other | Admitting: Internal Medicine

## 2016-02-08 ENCOUNTER — Telehealth: Payer: Self-pay | Admitting: Internal Medicine

## 2016-02-08 ENCOUNTER — Encounter: Payer: Self-pay | Admitting: Internal Medicine

## 2016-02-08 MED ORDER — JANUVIA 100 MG PO TABS
100.0000 mg | ORAL_TABLET | Freq: Every day | ORAL | Status: DC
Start: 2016-02-08 — End: 2016-02-29

## 2016-02-08 NOTE — Telephone Encounter (Signed)
Januvia sent.    Kennyth Lose- can you call Pt and inform him that I have refilled his Januvia for 30 days, but needs to be seen before July 3 for further refills. Okay to put 2-15 minute appts together. Thank you.

## 2016-02-08 NOTE — Telephone Encounter (Signed)
Called pt and informed him the below, pt understood and was scheduled an appt for February 25, 2016.

## 2016-02-08 NOTE — Telephone Encounter (Signed)
Please advise, Pt has NP appt scheduled for 03/10/2016 (which will have to potentially be rescheduled).

## 2016-02-08 NOTE — Telephone Encounter (Signed)
Call patient, make an appointment for next week, okay to put together to 15 minutes appointment. Okay to refill Januvia for one month

## 2016-02-14 ENCOUNTER — Ambulatory Visit: Payer: Medicare Other

## 2016-02-15 ENCOUNTER — Ambulatory Visit (INDEPENDENT_AMBULATORY_CARE_PROVIDER_SITE_OTHER): Payer: Medicare Other

## 2016-02-15 DIAGNOSIS — E538 Deficiency of other specified B group vitamins: Secondary | ICD-10-CM | POA: Diagnosis not present

## 2016-02-15 MED ORDER — CYANOCOBALAMIN 1000 MCG/ML IJ SOLN
1000.0000 ug | Freq: Once | INTRAMUSCULAR | Status: AC
  Administered 2016-02-15: 1000 ug via INTRAMUSCULAR

## 2016-02-22 ENCOUNTER — Telehealth: Payer: Self-pay

## 2016-02-25 ENCOUNTER — Ambulatory Visit (INDEPENDENT_AMBULATORY_CARE_PROVIDER_SITE_OTHER): Payer: Medicare Other | Admitting: Internal Medicine

## 2016-02-25 ENCOUNTER — Encounter: Payer: Self-pay | Admitting: Internal Medicine

## 2016-02-25 VITALS — BP 124/70 | HR 69 | Temp 97.8°F | Ht 72.0 in | Wt 203.0 lb

## 2016-02-25 DIAGNOSIS — E538 Deficiency of other specified B group vitamins: Secondary | ICD-10-CM

## 2016-02-25 DIAGNOSIS — E118 Type 2 diabetes mellitus with unspecified complications: Secondary | ICD-10-CM

## 2016-02-25 DIAGNOSIS — E119 Type 2 diabetes mellitus without complications: Secondary | ICD-10-CM

## 2016-02-25 DIAGNOSIS — I10 Essential (primary) hypertension: Secondary | ICD-10-CM

## 2016-02-25 DIAGNOSIS — E785 Hyperlipidemia, unspecified: Secondary | ICD-10-CM

## 2016-02-25 DIAGNOSIS — Z09 Encounter for follow-up examination after completed treatment for conditions other than malignant neoplasm: Secondary | ICD-10-CM | POA: Insufficient documentation

## 2016-02-25 NOTE — Progress Notes (Signed)
Pre visit review using our clinic review tool, if applicable. No additional management support is needed unless otherwise documented below in the visit note. 

## 2016-02-25 NOTE — Patient Instructions (Signed)
GO TO THE LAB : Get the blood work     GO TO THE FRONT DESK Schedule your next appointment for a a physical exam in 4 months, fasting.

## 2016-02-25 NOTE — Assessment & Plan Note (Signed)
DM: Continue Januvia metformin. Check a A1c HTN: Continue lisinopril, carvedilol and clonidine at bedtime. Check a CMP. Hyperlipidemia: Diet control, check A1c B12 deficiency: On supplements IM; reports occasional diarrhea sometimes 3 days after a B12 shot, diarrhea not listed as a s/e. Will check B 12 levels. Consider further eval for diarrhea Primary care: Chart reviewed: Reports he had a Zostavax. No documentation Reports he had a pneumonia shot (13? 23?) No documentation Never had a colonoscopy Td 2010 RTC 4 months, CPX

## 2016-02-25 NOTE — Progress Notes (Signed)
Subjective:    Patient ID: Alex Church, male    DOB: 1945/10/24, 70 y.o.   MRN: [id number]  DOS:  02/25/2016 Type of visit - description : new pt, transferring from Dr. Linna Darner, chart reviewed Interval history: DM: Good compliance with medications, ambulatory blood sugars in the morning are in the high side from 140s to  200, in the afternoons usually better ~ 120. HTN: Good compliance with meds, he did decrease clonidine to only one tablet at bedtime due to fatigue. Ambulatory BPs are great High cholesterol: On no medications, diet controlled. B12 deficiency: Get shots monthly, has noted mild diarrhea 2-3 days after each B12 shot.   Review of Systems  Denies chest pain or difficulty breathing No nausea, vomiting, blood in the stools. No anxiety or depression.  Past Medical History  Diagnosis Date  . Diabetes mellitus   . Hyperlipemia   . Hypertension   . B12 deficiency     monthly shots    Past Surgical History  Procedure Laterality Date  . No past surgeries      Social History   Social History  . Marital Status: Married    Spouse Name: N/A  . Number of Children: 0  . Years of Education: N/A   Occupational History  . retired from [employer], works for a lab driving    Social History Main Topics  . Smoking status: Never Smoker   . Smokeless tobacco: Not on file  . Alcohol Use: No  . Drug Use: No  . Sexual Activity: Not on file   Other Topics Concern  . Not on file   Social History Narrative   Married (2nd marriage)   Lives w/ wife and 1 dog (german sheppard)         Medication List       This list is accurate as of: 02/25/16  4:43 PM.  Always use your most recent med list.               aspirin 81 MG tablet  Take 81 mg by mouth daily.     CALCIUM + D PO  Take 1,000 mg by mouth daily.     carvedilol 12.5 MG tablet  Commonly known as:  COREG  Take 1 tablet (12.5 mg total) by mouth 2 (two) times daily with a meal. Must keep appt w/new PCP in July  4 refills     cloNIDine 0.2 MG tablet  Commonly known as:  CATAPRES  Take 0.2 mg by mouth at bedtime.     Fish Oil 1000 MG Caps  Take 1,000 mg by mouth 2 (two) times daily.     Glucosamine-Chondroitin 500-400 MG Caps  Take 3 capsules by mouth 2 (two) times daily.     JANUVIA 100 MG tablet  Generic drug:  sitaGLIPtin  Take 1 tablet (100 mg total) by mouth daily.     lisinopril 20 MG tablet  Commonly known as:  PRINIVIL,ZESTRIL  Take 1 tablet (20 mg total) by mouth 2 (two) times daily. Must keep new appt w/new PCP " Dr. Larose Kells" for future refills     metFORMIN 1000 MG tablet  Commonly known as:  GLUCOPHAGE  Take 1 tablet (1,000 mg total) by mouth 2 (two) times daily with a meal. Must keep appt w/new PCP for future refills     multivitamin tablet  Take 1 tablet by mouth daily.     NON FORMULARY  Take 1 tablet by mouth daily. [place] [employer]  ONE TOUCH ULTRA TEST test strip  Generic drug:  glucose blood  1 each by Other route as needed. Reported on 02/25/2016           Objective:   Physical Exam BP 124/70 mmHg  Pulse 69  Temp(Src) 97.8 F (36.6 C) (Oral)  Ht 6' (1.829 m)  Wt 203 lb (92.08 kg)  BMI 27.53 kg/m2  SpO2 97%  General:   Well developed, well nourished . NAD.  Neck: Normal carotid pulses  HEENT:  Normocephalic . Face symmetric, atraumatic Lungs:  CTA B Normal respiratory effort, no intercostal retractions, no accessory muscle use. Heart: RRR,  no murmur.  No pretibial edema bilaterally  Abdomen:  Not distended, soft, non-tender. No rebound or rigidity.   Skin: Exposed areas without rash. Not pale. Not jaundice Neurologic:  alert & oriented X3.  Speech normal, gait appropriate for age and unassisted Strength symmetric and appropriate for age.  Psych: Cognition and judgment appear intact.  Cooperative with normal attention span and concentration.  Behavior appropriate. No anxious or depressed appearing.    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  PLAN:  DM: Continue Januvia metformin. Check a A1c HTN: Continue lisinopril, carvedilol and clonidine at bedtime. Check a CMP. Hyperlipidemia: Diet control, check A1c B12 deficiency: On supplements IM; reports occasional diarrhea sometimes 3 days after a B12 shot, diarrhea not listed as a s/e. Will check B 12 levels. Consider further eval for diarrhea Primary care: Chart reviewed: Reports he had a Zostavax. No documentation Reports he had a pneumonia shot (13? 23?) No documentation Never had a colonoscopy Td 2010 RTC 4 months, CPX

## 2016-02-25 NOTE — Telephone Encounter (Signed)
Pre visit call completed 

## 2016-02-26 LAB — COMPREHENSIVE METABOLIC PANEL
ALK PHOS: 50 U/L (ref 39–117)
ALT: 17 U/L (ref 0–53)
AST: 16 U/L (ref 0–37)
Albumin: 4.2 g/dL (ref 3.5–5.2)
BUN: 17 mg/dL (ref 6–23)
CHLORIDE: 102 meq/L (ref 96–112)
CO2: 27 meq/L (ref 19–32)
Calcium: 9.5 mg/dL (ref 8.4–10.5)
Creatinine, Ser: 0.92 mg/dL (ref 0.40–1.50)
GFR: 86.5 mL/min (ref >60.00)
GLUCOSE: 129 mg/dL — AB (ref 70–99)
POTASSIUM: 4.3 meq/L (ref 3.5–5.1)
SODIUM: 137 meq/L (ref 135–145)
TOTAL PROTEIN: 7.4 g/dL (ref 6.0–8.3)
Total Bilirubin: 0.5 mg/dL (ref 0.2–1.2)

## 2016-02-26 LAB — LIPID PANEL
CHOL/HDL RATIO: 5
Cholesterol: 192 mg/dL (ref 0–200)
HDL: 39.6 mg/dL (ref >39.00)
LDL CALC: 121 mg/dL — AB (ref 0–99)
NONHDL: 152.5
Triglycerides: 158 mg/dL — ABNORMAL HIGH (ref 0.0–149.0)
VLDL: 31.6 mg/dL (ref 0.0–40.0)

## 2016-02-26 LAB — VITAMIN B12: Vitamin B-12: 492 pg/mL (ref 211–911)

## 2016-02-26 LAB — HEMOGLOBIN A1C: HEMOGLOBIN A1C: 7.1 % — AB (ref 4.6–6.5)

## 2016-02-28 ENCOUNTER — Encounter: Payer: Self-pay | Admitting: Internal Medicine

## 2016-02-28 MED ORDER — METFORMIN HCL 1000 MG PO TABS: ORAL_TABLET | ORAL | Status: DC

## 2016-02-28 NOTE — Addendum Note (Signed)
Addended byDamita Dunnings D on: 02/28/2016 09:23 AM   Modules accepted: Orders

## 2016-02-29 ENCOUNTER — Other Ambulatory Visit: Payer: Self-pay | Admitting: Internal Medicine

## 2016-03-01 ENCOUNTER — Encounter: Payer: Self-pay | Admitting: Internal Medicine

## 2016-03-10 ENCOUNTER — Ambulatory Visit: Payer: Medicare Other | Admitting: Internal Medicine

## 2016-03-20 ENCOUNTER — Ambulatory Visit (INDEPENDENT_AMBULATORY_CARE_PROVIDER_SITE_OTHER): Payer: Medicare Other | Admitting: *Deleted

## 2016-03-20 DIAGNOSIS — E538 Deficiency of other specified B group vitamins: Secondary | ICD-10-CM | POA: Diagnosis not present

## 2016-03-20 MED ORDER — CYANOCOBALAMIN 1000 MCG/ML IJ SOLN
1000.0000 ug | Freq: Once | INTRAMUSCULAR | Status: AC
  Administered 2016-03-20: 1000 ug via INTRAMUSCULAR

## 2016-03-20 NOTE — Progress Notes (Signed)
Pre visit review using our clinic review tool, if applicable. No additional management support is needed unless otherwise documented below in the visit note.  Patient tolerated injection well.  Pt will call to schedule next appointment.   Dorrene German, RN

## 2016-03-25 NOTE — Telephone Encounter (Signed)
Completed.

## 2016-04-15 ENCOUNTER — Other Ambulatory Visit: Payer: Self-pay

## 2016-04-15 MED ORDER — LISINOPRIL 20 MG PO TABS: 20.0000 mg | ORAL_TABLET | Freq: Two times a day (BID) | ORAL | 1 refills | Status: DC

## 2016-04-18 ENCOUNTER — Ambulatory Visit (INDEPENDENT_AMBULATORY_CARE_PROVIDER_SITE_OTHER): Payer: Medicare Other | Admitting: *Deleted

## 2016-04-18 DIAGNOSIS — E538 Deficiency of other specified B group vitamins: Secondary | ICD-10-CM

## 2016-04-18 MED ORDER — CYANOCOBALAMIN 1000 MCG/ML IJ SOLN
1000.0000 ug | Freq: Once | INTRAMUSCULAR | Status: AC
  Administered 2016-04-18: 1000 ug via INTRAMUSCULAR

## 2016-04-18 NOTE — Progress Notes (Signed)
Pre visit review using our clinic review tool, if applicable. No additional management support is needed unless otherwise documented below in the visit note.  Patient tolerated injection well.  Conny Situ J Sebastyan Snodgrass, RN 

## 2016-04-29 ENCOUNTER — Other Ambulatory Visit: Payer: Self-pay | Admitting: Internal Medicine

## 2016-05-29 ENCOUNTER — Ambulatory Visit (INDEPENDENT_AMBULATORY_CARE_PROVIDER_SITE_OTHER): Payer: Medicare Other | Admitting: Behavioral Health

## 2016-05-29 DIAGNOSIS — E538 Deficiency of other specified B group vitamins: Secondary | ICD-10-CM | POA: Diagnosis not present

## 2016-05-29 MED ORDER — CYANOCOBALAMIN 1000 MCG/ML IJ SOLN
1000.0000 ug | Freq: Once | INTRAMUSCULAR | Status: AC
  Administered 2016-05-29: 1000 ug via INTRAMUSCULAR

## 2016-05-29 NOTE — Progress Notes (Addendum)
Pre visit review using our clinic review tool, if applicable. No additional management support is needed unless otherwise documented below in the visit note.  Patient in clinic today for B12 injection. IM given in Left Deltoid. Patient tolerated injection well.  Kathlene November, MD

## 2016-06-25 LAB — HM DIABETES EYE EXAM

## 2016-06-26 ENCOUNTER — Encounter: Payer: Self-pay | Admitting: Internal Medicine

## 2016-06-30 ENCOUNTER — Encounter: Payer: Self-pay | Admitting: Internal Medicine

## 2016-06-30 ENCOUNTER — Telehealth: Payer: Self-pay

## 2016-06-30 ENCOUNTER — Ambulatory Visit (INDEPENDENT_AMBULATORY_CARE_PROVIDER_SITE_OTHER): Payer: Medicare Other | Admitting: Internal Medicine

## 2016-06-30 VITALS — BP 122/78 | HR 76 | Temp 98.2°F | Resp 14 | Ht 72.0 in | Wt 201.2 lb

## 2016-06-30 DIAGNOSIS — E538 Deficiency of other specified B group vitamins: Secondary | ICD-10-CM | POA: Diagnosis not present

## 2016-06-30 DIAGNOSIS — E118 Type 2 diabetes mellitus with unspecified complications: Secondary | ICD-10-CM

## 2016-06-30 DIAGNOSIS — Z23 Encounter for immunization: Secondary | ICD-10-CM | POA: Diagnosis not present

## 2016-06-30 DIAGNOSIS — Z0000 Encounter for general adult medical examination without abnormal findings: Secondary | ICD-10-CM

## 2016-06-30 DIAGNOSIS — E785 Hyperlipidemia, unspecified: Secondary | ICD-10-CM

## 2016-06-30 DIAGNOSIS — I10 Essential (primary) hypertension: Secondary | ICD-10-CM

## 2016-06-30 LAB — BASIC METABOLIC PANEL
BUN: 25 mg/dL — ABNORMAL HIGH (ref 6–23)
CALCIUM: 10.2 mg/dL (ref 8.4–10.5)
CO2: 29 meq/L (ref 19–32)
Chloride: 101 mEq/L (ref 96–112)
Creatinine, Ser: 0.96 mg/dL (ref 0.40–1.50)
GFR: 82.27 mL/min (ref >60.00)
Glucose, Bld: 150 mg/dL — ABNORMAL HIGH (ref 70–99)
POTASSIUM: 5.2 meq/L — AB (ref 3.5–5.1)
SODIUM: 137 meq/L (ref 135–145)

## 2016-06-30 LAB — CBC WITH DIFFERENTIAL/PLATELET
Basophils Absolute: 0.1 10*3/uL (ref 0.0–0.1)
Basophils Relative: 0.6 % (ref 0.0–3.0)
EOS PCT: 1.4 % (ref 0.0–5.0)
Eosinophils Absolute: 0.1 10*3/uL (ref 0.0–0.7)
HCT: 40.1 % (ref 39.0–52.0)
Hemoglobin: 13.3 g/dL (ref 13.0–17.0)
LYMPHS ABS: 1.9 10*3/uL (ref 0.7–4.0)
Lymphocytes Relative: 18.1 % (ref 12.0–46.0)
MCHC: 33.2 g/dL (ref 30.0–36.0)
MCV: 88.8 fl (ref 78.0–100.0)
MONO ABS: 0.6 10*3/uL (ref 0.1–1.0)
MONOS PCT: 6 % (ref 3.0–12.0)
NEUTROS ABS: 7.6 10*3/uL (ref 1.4–7.7)
NEUTROS PCT: 73.9 % (ref 43.0–77.0)
PLATELETS: 220 10*3/uL (ref 150.0–400.0)
RBC: 4.51 Mil/uL (ref 4.22–5.81)
RDW: 14.3 % (ref 11.5–15.5)
WBC: 10.4 10*3/uL (ref 4.0–10.5)

## 2016-06-30 LAB — LIPID PANEL
CHOL/HDL RATIO: 5
Cholesterol: 183 mg/dL (ref 0–200)
HDL: 40 mg/dL (ref >39.00)
LDL Cholesterol: 122 mg/dL — ABNORMAL HIGH (ref 0–99)
NonHDL: 143.04
TRIGLYCERIDES: 107 mg/dL (ref 0.0–149.0)
VLDL: 21.4 mg/dL (ref 0.0–40.0)

## 2016-06-30 LAB — HEMOGLOBIN A1C: HEMOGLOBIN A1C: 6.9 % — AB (ref 4.6–6.5)

## 2016-06-30 MED ORDER — CYANOCOBALAMIN 1000 MCG/ML IJ SOLN
1000.0000 ug | Freq: Once | INTRAMUSCULAR | Status: AC
  Administered 2016-06-30: 1000 ug via INTRAMUSCULAR

## 2016-06-30 NOTE — Telephone Encounter (Signed)
Cologuard ordered via [employer] portal. Order number: [id number]. Order confirmation sent for scanning.

## 2016-06-30 NOTE — Progress Notes (Signed)
Subjective:    Patient ID: Alex Church, male    DOB: Apr 01, 1946, 70 y.o.   MRN: [id number]  DOS:  06/30/2016 Type of visit - description :  Here for Medicare AWV: 1. Risk factors based on Past M, S, F history: reviewed 2. Physical Activities:  Take walks sometimes , house work 3. Depression/mood: neg screening  4. Hearing:  No problems noted or reported  5. ADL's: independent, drives  6. Fall Risk: no recent falls, prevention discussed , see AVS 7. home Safety: does feel safe at home  8. Height, weight, & visual acuity: see VS, sees eye doctor regularly, Dr Herbert Deaner, considering cataract surgery 9. Counseling: provided 10. Labs ordered based on risk factors: if needed  11. Referral Coordination: if needed 12. Care Plan, see assessment and plan , written personalized plan provided , see AVS 13. Cognitive Assessment: motor skills and cognition appropriate for age 46. Care team updated, sees Dr Herbert Deaner   15. End-of-life care discussed , has a HC POA  In addition, today we discussed the following: DM: Good med compliance, ambulatory blood sugars sometimes high in the morning, 180s. High cholesterol: due  labs HTN: Good med compliance, reports normal ambulatory BPs B12 deficiency: Due for a shot  Wt Readings from Last 3 Encounters:  06/30/16 201 lb 4 oz (91.3 kg)  02/25/16 203 lb (92.1 kg)  01/04/15 201 lb 4 oz (91.3 kg)     Review of Systems Constitutional: No fever. No chills. No unexplained wt changes. No unusual sweats  HEENT: No dental problems, no ear discharge, no facial swelling, no voice changes. No eye discharge, no eye  redness , no  intolerance to light   Respiratory: No wheezing , no  difficulty breathing. No cough , no mucus production  Cardiovascular: No CP, no leg swelling , no  Palpitations  GI: no nausea, no vomiting, no diarrhea , no  abdominal pain.  No blood in the stools. No dysphagia, no odynophagia    Endocrine: No polyphagia, no polyuria , no  polydipsia  GU: No dysuria, gross hematuria, difficulty urinating. No urinary urgency, no frequency.  Musculoskeletal: No joint swellings or unusual aches or pains  Skin: No change in the color of the skin, palor , no  Rash  Allergic, immunologic: No environmental allergies , no  food allergies  Neurological: No dizziness no  syncope. No headaches. No diplopia, no slurred, no slurred speech, no motor deficits, no facial  Numbness  Hematological: No enlarged lymph nodes, no easy bruising , no unusual bleedings  Psychiatry: No suicidal ideas, no hallucinations, no beavior problems, no confusion.  No unusual/severe anxiety, no depression   Past Medical History:  Diagnosis Date  . B12 deficiency    monthly shots  . Diabetes mellitus   . Hyperlipemia   . Hypertension     Past Surgical History:  Procedure Laterality Date  . NO PAST SURGERIES      Social History   Social History  . Marital status: Married    Spouse name: N/A  . Number of children: 0  . Years of education: N/A   Occupational History  . retired from [employer], works for a lab driving    Social History Main Topics  . Smoking status: Never Smoker  . Smokeless tobacco: Never Used  . Alcohol use No  . Drug use: No  . Sexual activity: Not on file   Other Topics Concern  . Not on file   Social History Narrative  Married (2nd marriage)   Lives w/ wife and 1 dog (german sheppard)    Family History  Problem Relation Age of Onset  . Diabetes Maternal Uncle   . Heart attack Brother 45  . Cancer Mother     intra-abdominal  . Cirrhosis Cousin     maternal  . Diabetes Cousin     maternal  . Stroke Neg Hx   . Colon cancer Neg Hx   . Prostate cancer Neg Hx         Medication List       Accurate as of 06/30/16 11:59 PM. Always use your most recent med list.          aspirin 81 MG tablet Take 81 mg by mouth daily.   CALCIUM + D PO Take 1,000 mg by mouth daily.   carvedilol 12.5 MG  tablet Commonly known as:  COREG Take 1 tablet (12.5 mg total) by mouth 2 (two) times daily with a meal.   cloNIDine 0.2 MG tablet Commonly known as:  CATAPRES Take 0.2 mg by mouth at bedtime.   Fish Oil 1000 MG Caps Take 1,000 mg by mouth 2 (two) times daily.   Glucosamine-Chondroitin 500-400 MG Caps Take 3 capsules by mouth 2 (two) times daily.   JANUVIA 100 MG tablet Generic drug:  sitaGLIPtin Take 1 tablet (100 mg total) by mouth daily.   lisinopril 20 MG tablet Commonly known as:  PRINIVIL,ZESTRIL Take 1 tablet (20 mg total) by mouth 2 (two) times daily.   metFORMIN 1000 MG tablet Commonly known as:  GLUCOPHAGE Take 1.5 tablets by mouth in the morning and 1 tablet by mouth in the evening.   multivitamin tablet Take 1 tablet by mouth daily.   NON FORMULARY Take 1 tablet by mouth daily. [place] Grape Seed   ONE TOUCH ULTRA TEST test strip Generic drug:  glucose blood 1 each by Other route as needed. Reported on 02/25/2016          Objective:   Physical Exam BP 122/78 (BP Location: Left Arm, Patient Position: Sitting, Cuff Size: Normal)   Pulse 76   Temp 98.2 F (36.8 C) (Oral)   Resp 14   Ht 6' (1.829 m)   Wt 201 lb 4 oz (91.3 kg)   SpO2 98%   BMI 27.29 kg/m   General:   Well developed, well nourished . NAD.  Neck: No  thyromegaly  HEENT:  Normocephalic . Face symmetric, atraumatic Neck: Normal carotid pulses Lungs:  CTA B Normal respiratory effort, no intercostal retractions, no accessory muscle use. Heart: RRR,  no murmur.  No pretibial edema bilaterally  Abdomen:  Not distended, soft, non-tender. No rebound or rigidity. No bruit  Skin: Exposed areas without rash. Not pale. Not jaundice Neurologic:  alert & oriented X3.  Speech normal, gait appropriate for age and unassisted Strength symmetric and appropriate for age.  Psych: Cognition and judgment appear intact.  Cooperative with normal attention span and concentration.  Behavior  appropriate. No anxious or depressed appearing.    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 +FH CAD  PLAN:  DM: On Januvia and metformin, last A1c not at goal, recheck today. HTN: Well-controlled, check a BMP, CBC. Continue carvedilol, clonidine, lisinopril.  Hyperlipidemia: So far on diet control, rechecking labs. LDL goal 100 B  12 deficiency: Shot today. Thyromegaly? Normal exam today. RTC 4  months

## 2016-06-30 NOTE — Progress Notes (Signed)
Pre visit review using our clinic review tool, if applicable. No additional management support is needed unless otherwise documented below in the visit note. 

## 2016-06-30 NOTE — Patient Instructions (Signed)
Get your blood work before you leave   Next visit 4 months    Fall Prevention and [place] cause injuries and can affect all age groups. It is possible to use preventive measures to significantly decrease the likelihood of falls. There are many simple measures which can make your home safer and prevent falls. OUTDOORS  Repair cracks and edges of walkways and driveways.  Remove high doorway thresholds.  Trim shrubbery on the main path into your home.  Have good outside lighting.  Clear walkways of tools, rocks, debris, and clutter.  Check that handrails are not broken and are securely fastened. Both sides of steps should have handrails.  Have leaves, snow, and ice cleared regularly.  Use sand or salt on walkways during winter months.  In the garage, clean up grease or oil spills. BATHROOM  Install night lights.  Install grab bars by the toilet and in the tub and shower.  Use non-skid mats or decals in the tub or shower.  Place a plastic non-slip stool in the shower to sit on, if needed.  Keep floors dry and clean up all water on the floor immediately.  Remove soap buildup in the tub or shower on a regular basis.  Secure bath mats with non-slip, double-sided rug tape.  Remove throw rugs and tripping hazards from the floors. BEDROOMS  Install night lights.  Make sure a bedside light is easy to reach.  Do not use oversized bedding.  Keep a telephone by your bedside.  Have a firm chair with side arms to use for getting dressed.  Remove throw rugs and tripping hazards from the floor. KITCHEN  Keep handles on pots and pans turned toward the center of the stove. Use back burners when possible.  Clean up spills quickly and allow time for drying.  Avoid walking on wet floors.  Avoid hot utensils and knives.  Position shelves so they are not too high or low.  Place commonly used objects within easy reach.  If necessary, use a sturdy step stool with  a grab bar when reaching.  Keep electrical cables out of the way.  Do not use floor polish or wax that makes floors slippery. If you must use wax, use non-skid floor wax.  Remove throw rugs and tripping hazards from the floor. STAIRWAYS  Never leave objects on stairs.  Place handrails on both sides of stairways and use them. Fix any loose handrails. Make sure handrails on both sides of the stairways are as long as the stairs.  Check carpeting to make sure it is firmly attached along stairs. Make repairs to worn or loose carpet promptly.  Avoid placing throw rugs at the top or bottom of stairways, or properly secure the rug with carpet tape to prevent slippage. Get rid of throw rugs, if possible.  Have an electrician put in a light switch at the top and bottom of the stairs. OTHER FALL PREVENTION TIPS  Wear low-heel or rubber-soled shoes that are supportive and fit well. Wear closed toe shoes.  When using a stepladder, make sure it is fully opened and both spreaders are firmly locked. Do not climb a closed stepladder.  Add color or contrast paint or tape to grab bars and handrails in your home. Place contrasting color strips on first and last steps.  Learn and use mobility aids as needed. Install an electrical emergency response system.  Turn on lights to avoid dark areas. Replace light bulbs that burn out immediately. Get light  switches that glow.  Arrange furniture to create clear pathways. Keep furniture in the same place.  Firmly attach carpet with non-skid or double-sided tape.  Eliminate uneven floor surfaces.  Select a carpet pattern that does not visually hide the edge of steps.  Be aware of all pets. OTHER HOME SAFETY TIPS  Set the water temperature for 120 F (48.8 C).  Keep emergency numbers on or near the telephone.  Keep smoke detectors on every level of the home and near sleeping areas. Document Released: 08/15/2002 Document Revised: 02/24/2012 Document  Reviewed: 11/14/2011 [facility] Patient Information 2015 [place], [place]. This information is not intended to replace advice given to you by your health care provider. Make sure you discuss any questions you have with your health care provider.   Preventive Care for Adults Ages 50 and over  Blood pressure check.** / Every 1 to 2 years.  Lipid and cholesterol check.**/ Every 5 years beginning at age 56.  Lung cancer screening. / Every year if you are aged 44-80 years and have a 30-pack-year history of smoking and currently smoke or have quit within the past 15 years. Yearly screening is stopped once you have quit smoking for at least 15 years or develop a health problem that would prevent you from having lung cancer treatment.  Fecal occult blood test (FOBT) of stool. / Every year beginning at age 62 and continuing until age 53. You may not have to do this test if you get a colonoscopy every 10 years.  Flexible sigmoidoscopy** or colonoscopy.** / Every 5 years for a flexible sigmoidoscopy or every 10 years for a colonoscopy beginning at age 31 and continuing until age 27.  Hepatitis C blood test.** / For all people born from [birth year] through 1965 and any individual with known risks for hepatitis C.  Abdominal aortic aneurysm (AAA) screening.** / A one-time screening for ages 27 to 81 years who are current or former smokers.  Skin self-exam. / Monthly.  Influenza vaccine. / Every year.  Tetanus, diphtheria, and acellular pertussis (Tdap/Td) vaccine.** / 1 dose of Td every 10 years.  Varicella vaccine.** / Consult your health care provider.  Zoster vaccine.** / 1 dose for adults aged 31 years or older.  Pneumococcal 13-valent conjugate (PCV13) vaccine.** / Consult your health care provider.  Pneumococcal polysaccharide (PPSV23) vaccine.** / 1 dose for all adults aged 4 years and older.  Meningococcal vaccine.** / Consult your health care provider.  Hepatitis A vaccine.** / Consult your  health care provider.  Hepatitis B vaccine.** / Consult your health care provider.  Haemophilus influenzae type b (Hib) vaccine.** / Consult your health care provider. **Family history and personal history of risk and conditions may change your health care provider's recommendations. Document Released: 10/21/2001 Document Revised: 08/30/2013 Document Reviewed: 01/20/2011 [facility] Patient Information 2015 [place], [place]. This information is not intended to replace advice given to you by your health care provider. Make sure you discuss any questions you have with your health care provider.

## 2016-06-30 NOTE — Assessment & Plan Note (Addendum)
Td 2010;  pnm 23 ?;  pnm 13? zostavax : got it before? ----- Pt will get records , further shots once we have records  Flu shot today CCS: Never had a scope, declined to have one; iFOB vs cologuard discussed, elected cologuard. All previous PSAs wnl, DRE (-) 2014 per  Dr Linna Darner  Diet and exercise discussed.

## 2016-07-01 MED ORDER — ATORVASTATIN CALCIUM 10 MG PO TABS: 10.0000 mg | ORAL_TABLET | Freq: Every day | ORAL | 3 refills | Status: DC

## 2016-07-01 NOTE — Assessment & Plan Note (Signed)
DM: On Januvia and metformin, last A1c not at goal, recheck today. HTN: Well-controlled, check a BMP, CBC. Continue carvedilol, clonidine, lisinopril.  Hyperlipidemia: So far on diet control, rechecking labs. LDL goal 100 B 12 deficiency: Shot today. Thyromegaly? Normal exam today. RTC 4  months

## 2016-07-01 NOTE — Addendum Note (Signed)
Addended byDamita Dunnings D on: 07/01/2016 05:04 PM   Modules accepted: Orders

## 2016-07-17 ENCOUNTER — Encounter: Payer: Self-pay | Admitting: Internal Medicine

## 2016-07-30 ENCOUNTER — Encounter: Payer: Self-pay | Admitting: Internal Medicine

## 2016-07-30 DIAGNOSIS — Z125 Encounter for screening for malignant neoplasm of prostate: Secondary | ICD-10-CM

## 2016-08-04 NOTE — Telephone Encounter (Signed)
PSA ordered

## 2016-08-04 NOTE — Telephone Encounter (Signed)
See patient's message, add a PSA DX screening  Received: Today  [place], [place]  Damita Dunnings, CMA

## 2016-08-12 ENCOUNTER — Other Ambulatory Visit (INDEPENDENT_AMBULATORY_CARE_PROVIDER_SITE_OTHER): Payer: Medicare Other

## 2016-08-12 ENCOUNTER — Ambulatory Visit (INDEPENDENT_AMBULATORY_CARE_PROVIDER_SITE_OTHER): Payer: Medicare Other

## 2016-08-12 ENCOUNTER — Other Ambulatory Visit: Payer: Medicare Other

## 2016-08-12 DIAGNOSIS — Z125 Encounter for screening for malignant neoplasm of prostate: Secondary | ICD-10-CM

## 2016-08-12 DIAGNOSIS — E538 Deficiency of other specified B group vitamins: Secondary | ICD-10-CM

## 2016-08-12 DIAGNOSIS — E785 Hyperlipidemia, unspecified: Secondary | ICD-10-CM

## 2016-08-12 LAB — LIPID PANEL
CHOL/HDL RATIO: 3
CHOLESTEROL: 110 mg/dL (ref 0–200)
HDL: 43.3 mg/dL (ref >39.00)
LDL Cholesterol: 45 mg/dL (ref 0–99)
NonHDL: 66.82
TRIGLYCERIDES: 108 mg/dL (ref 0.0–149.0)
VLDL: 21.6 mg/dL (ref 0.0–40.0)

## 2016-08-12 LAB — AST: AST: 16 U/L (ref 0–37)

## 2016-08-12 LAB — PSA: PSA: 1.4 ng/mL (ref 0.10–4.00)

## 2016-08-12 LAB — ALT: ALT: 14 U/L (ref 0–53)

## 2016-08-12 MED ORDER — CYANOCOBALAMIN 1000 MCG/ML IJ SOLN
1000.0000 ug | Freq: Once | INTRAMUSCULAR | Status: AC
  Administered 2016-08-12: 1000 ug via INTRAMUSCULAR

## 2016-08-12 NOTE — Progress Notes (Signed)
Pre visit review using our clinic tool,if applicable. No additional management support is needed unless otherwise documented below in the visit note.   Patient in for B12 injection per order from Dr. Larose Kells . Given IM Left deltoid. Patient tolerated well.

## 2016-09-12 ENCOUNTER — Other Ambulatory Visit: Payer: Self-pay | Admitting: Internal Medicine

## 2016-09-23 ENCOUNTER — Telehealth: Payer: Self-pay | Admitting: Internal Medicine

## 2016-09-23 NOTE — Telephone Encounter (Signed)
Patient scheduled for 07/17/2017 °

## 2016-09-23 NOTE — Telephone Encounter (Signed)
Pt is to receive B12 injections monthly. Okay to schedule nurse visit at his convenience.

## 2016-09-23 NOTE — Telephone Encounter (Signed)
Relation to [id number] Call back number:[phone] Pharmacy:  Reason for call:  Patient would like to schedule B12 appointment, requesting orders please advise

## 2016-09-26 ENCOUNTER — Ambulatory Visit (INDEPENDENT_AMBULATORY_CARE_PROVIDER_SITE_OTHER): Payer: Medicare Other

## 2016-09-26 DIAGNOSIS — E538 Deficiency of other specified B group vitamins: Secondary | ICD-10-CM

## 2016-09-26 MED ORDER — CYANOCOBALAMIN 1000 MCG/ML IJ SOLN
1000.0000 ug | Freq: Once | INTRAMUSCULAR | Status: AC
  Administered 2016-09-26: 1000 ug via INTRAMUSCULAR

## 2016-09-26 NOTE — Progress Notes (Signed)
Pre visit review using our clinic tool,if applicable. No additional management support is needed unless otherwise documented below in the visit note.   Patient given B12 injection per order from Dr. Larose Kells due to patients B12 defeciency. Given IM Left deltoid. Patient will call and schedule next appointment.

## 2016-10-03 ENCOUNTER — Other Ambulatory Visit: Payer: Self-pay | Admitting: Internal Medicine

## 2016-10-07 ENCOUNTER — Encounter: Payer: Self-pay | Admitting: Internal Medicine

## 2016-10-14 ENCOUNTER — Other Ambulatory Visit: Payer: Self-pay | Admitting: Internal Medicine

## 2016-10-14 ENCOUNTER — Encounter: Payer: Self-pay | Admitting: Internal Medicine

## 2016-10-28 ENCOUNTER — Other Ambulatory Visit: Payer: Self-pay | Admitting: Internal Medicine

## 2016-10-29 ENCOUNTER — Other Ambulatory Visit: Payer: Self-pay | Admitting: Internal Medicine

## 2016-11-06 ENCOUNTER — Ambulatory Visit (INDEPENDENT_AMBULATORY_CARE_PROVIDER_SITE_OTHER): Payer: Medicare Other | Admitting: Behavioral Health

## 2016-11-06 DIAGNOSIS — E538 Deficiency of other specified B group vitamins: Secondary | ICD-10-CM

## 2016-11-06 MED ORDER — CYANOCOBALAMIN 1000 MCG/ML IJ SOLN
1000.0000 ug | Freq: Once | INTRAMUSCULAR | Status: AC
  Administered 2016-11-06: 1000 ug via INTRAMUSCULAR

## 2016-11-06 NOTE — Progress Notes (Addendum)
Pre visit review using our clinic review tool, if applicable. No additional management support is needed unless otherwise documented below in the visit note.  Pt. came in for b12 injection and tolerated it well.  Kathlene November, MD

## 2016-11-07 NOTE — Progress Notes (Signed)
Noted. Agree with above.  

## 2016-11-10 ENCOUNTER — Other Ambulatory Visit: Payer: Self-pay | Admitting: Internal Medicine

## 2016-11-28 DIAGNOSIS — H40013 Open angle with borderline findings, low risk, bilateral: Secondary | ICD-10-CM | POA: Diagnosis not present

## 2016-11-28 DIAGNOSIS — H04123 Dry eye syndrome of bilateral lacrimal glands: Secondary | ICD-10-CM | POA: Diagnosis not present

## 2016-12-19 ENCOUNTER — Encounter: Payer: Self-pay | Admitting: Internal Medicine

## 2016-12-19 ENCOUNTER — Ambulatory Visit (INDEPENDENT_AMBULATORY_CARE_PROVIDER_SITE_OTHER): Payer: Medicare Other | Admitting: Internal Medicine

## 2016-12-19 VITALS — BP 122/68 | HR 61 | Temp 98.1°F | Resp 14 | Ht 72.0 in | Wt 201.2 lb

## 2016-12-19 DIAGNOSIS — E538 Deficiency of other specified B group vitamins: Secondary | ICD-10-CM

## 2016-12-19 DIAGNOSIS — Z23 Encounter for immunization: Secondary | ICD-10-CM | POA: Diagnosis not present

## 2016-12-19 DIAGNOSIS — E1151 Type 2 diabetes mellitus with diabetic peripheral angiopathy without gangrene: Secondary | ICD-10-CM

## 2016-12-19 DIAGNOSIS — E1165 Type 2 diabetes mellitus with hyperglycemia: Secondary | ICD-10-CM | POA: Diagnosis not present

## 2016-12-19 DIAGNOSIS — Z0000 Encounter for general adult medical examination without abnormal findings: Secondary | ICD-10-CM

## 2016-12-19 DIAGNOSIS — I10 Essential (primary) hypertension: Secondary | ICD-10-CM

## 2016-12-19 DIAGNOSIS — E782 Mixed hyperlipidemia: Secondary | ICD-10-CM

## 2016-12-19 DIAGNOSIS — Z1159 Encounter for screening for other viral diseases: Secondary | ICD-10-CM | POA: Diagnosis not present

## 2016-12-19 DIAGNOSIS — IMO0002 Reserved for concepts with insufficient information to code with codable children: Secondary | ICD-10-CM

## 2016-12-19 LAB — AST: AST: 21 U/L (ref 10–35)

## 2016-12-19 LAB — BASIC METABOLIC PANEL
BUN: 21 mg/dL (ref 7–25)
CALCIUM: 10.6 mg/dL — AB (ref 8.6–10.3)
CHLORIDE: 103 mmol/L (ref 98–110)
CO2: 20 mmol/L (ref 20–31)
Creat: 1.49 mg/dL — ABNORMAL HIGH (ref 0.70–1.18)
Glucose, Bld: 113 mg/dL — ABNORMAL HIGH (ref 65–99)
Potassium: 4.6 mmol/L (ref 3.5–5.3)
SODIUM: 137 mmol/L (ref 135–146)

## 2016-12-19 LAB — LIPID PANEL
Cholesterol: 112 mg/dL (ref <200)
HDL: 33 mg/dL — AB (ref >40)
LDL Cholesterol: 52 mg/dL (ref <100)
Total CHOL/HDL Ratio: 3.4 Ratio (ref <5.0)
Triglycerides: 135 mg/dL (ref <150)
VLDL: 27 mg/dL (ref <30)

## 2016-12-19 LAB — ALT: ALT: 15 U/L (ref 9–46)

## 2016-12-19 MED ORDER — CYANOCOBALAMIN 1000 MCG/ML IJ SOLN
1000.0000 ug | Freq: Once | INTRAMUSCULAR | Status: AC
  Administered 2016-12-19: 1000 ug via INTRAMUSCULAR

## 2016-12-19 NOTE — Progress Notes (Signed)
Subjective:    Patient ID: Alex Church, male    DOB: Jan 26, 1946, 71 y.o.   MRN: [id number]  DOS:  12/19/2016 Type of visit - description : f/u  Interval history: Since the last office visit he is feeling well, no major concerns. Good compliance w/ medications. Ambulatory BPs usually less than 180, occasionally 200. Due for a B12 shot    Review of Systems   Past Medical History:  Diagnosis Date  . B12 deficiency    monthly shots  . Diabetes mellitus   . Hyperlipemia   . Hypertension     Past Surgical History:  Procedure Laterality Date  . NO PAST SURGERIES      Social History   Social History  . Marital status: Married    Spouse name: N/A  . Number of children: 0  . Years of education: N/A   Occupational History  . retired from [employer], works for a lab driving    Social History Main Topics  . Smoking status: Never Smoker  . Smokeless tobacco: Never Used  . Alcohol use No  . Drug use: No  . Sexual activity: Not on file   Other Topics Concern  . Not on file   Social History Narrative   Married (2nd marriage)   Lives w/ wife and 1 dog (german sheppard)       Allergies as of 12/19/2016   No Known Allergies     Medication List       Accurate as of 12/19/16  2:25 PM. Always use your most recent med list.          aspirin 81 MG tablet Take 81 mg by mouth daily.   atorvastatin 10 MG tablet Commonly known as:  LIPITOR Take 1 tablet (10 mg total) by mouth at bedtime.   CALCIUM + D PO Take 1,000 mg by mouth daily.   carvedilol 12.5 MG tablet Commonly known as:  COREG Take 1 tablet (12.5 mg total) by mouth 2 (two) times daily with a meal.   cloNIDine 0.2 MG tablet Commonly known as:  CATAPRES Take 1 tablet (0.2 mg total) by mouth 2 (two) times daily.   Glucosamine-Chondroitin 500-400 MG Caps Take 3 capsules by mouth 2 (two) times daily.   JANUVIA 100 MG tablet Generic drug:  sitaGLIPtin Take 1 tablet (100 mg total) by mouth daily.     KRILL OIL OMEGA-3 PO Take by mouth.   lisinopril 20 MG tablet Commonly known as:  PRINIVIL,ZESTRIL Take 1 tablet (20 mg total) by mouth 2 (two) times daily.   metFORMIN 1000 MG tablet Commonly known as:  GLUCOPHAGE Take 1.5 tablets by mouth in the morning and 1 tablet by mouth in the evening.   multivitamin tablet Take 1 tablet by mouth daily.   NON FORMULARY Take 1 tablet by mouth daily. [place] Grape Seed   ONE TOUCH ULTRA TEST test strip Generic drug:  glucose blood 1 each by Other route as needed. Reported on 02/25/2016          Objective:   Physical Exam BP 122/68 (BP Location: Left Arm, Patient Position: Sitting, Cuff Size: Small)   Pulse 61   Temp 98.1 F (36.7 C) (Oral)   Resp 14   Ht 6' (1.829 m)   Wt 201 lb 4 oz (91.3 kg)   SpO2 95%   BMI 27.29 kg/m  General:   Well developed, well nourished . NAD.  HEENT:  Normocephalic . Face symmetric, atraumatic Lungs:  CTA  B Normal respiratory effort, no intercostal retractions, no accessory muscle use. Heart: RRR,  no murmur.  no pretibial edema bilaterally  Abdomen:  Not distended, soft, non-tender. No rebound or rigidity.  Skin: Not pale. Not jaundice Neurologic:  alert & oriented X3.  Speech normal, gait appropriate for age and unassisted Psych--  Cognition and judgment appear intact.  Cooperative with normal attention span and concentration.  Behavior appropriate. No anxious or depressed appearing.    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 +FH CAD  PLAN:   DM: Continue with metformin, Januvia. Last A1c 6.9. Working on diet and exercise. Recheck labs HTN: Well-controlled on carvedilol, clonidine, lisinopril. Check a BMP High cholesterol: on Lipitor. Check a FLP, AST, ALT B12 shot today Primary care: Hep C screening, Prevnar RTC 10- 2018, routine checkup.

## 2016-12-19 NOTE — Progress Notes (Signed)
Pre visit review using our clinic review tool, if applicable. No additional management support is needed unless otherwise documented below in the visit note. 

## 2016-12-19 NOTE — Assessment & Plan Note (Signed)
Prevnar today, Pneumovax at the next opportunity States he got a Zostavax before Has not completed cologuard,reports that he got a  open box. Will contact the company

## 2016-12-19 NOTE — Patient Instructions (Signed)
GO TO THE LAB : Get the blood work     GO TO THE FRONT DESK Schedule your next appointment for a  routine checkup by October 2018

## 2016-12-20 LAB — HEMOGLOBIN A1C
Hgb A1c MFr Bld: 6.9 % — ABNORMAL HIGH (ref <5.7)
MEAN PLASMA GLUCOSE: 151 mg/dL

## 2016-12-20 LAB — HEPATITIS C ANTIBODY: HCV Ab: NEGATIVE

## 2016-12-21 NOTE — Assessment & Plan Note (Signed)
DM: Continue with metformin, Januvia. Last A1c 6.9. Working on diet and exercise. Recheck labs HTN: Well-controlled on carvedilol, clonidine, lisinopril. Check a BMP High cholesterol: on Lipitor. Check a FLP, AST, ALT B12 shot today Primary care: Hep C screening, Prevnar RTC 10- 2018, routine checkup.

## 2016-12-22 ENCOUNTER — Encounter: Payer: Self-pay | Admitting: Internal Medicine

## 2016-12-22 NOTE — Addendum Note (Signed)
Addended byDamita Dunnings D on: 12/22/2016 08:27 AM   Modules accepted: Orders

## 2016-12-22 NOTE — Telephone Encounter (Signed)
Pt informed he received open Cologuard kit, informed [employer] twice but never received new one. Original order cancelled on 08/29/2016. New Cologuard order placed: Order number: [id number]. Order confirmation sent for scanning.

## 2016-12-29 ENCOUNTER — Other Ambulatory Visit: Payer: Self-pay | Admitting: Internal Medicine

## 2017-01-01 ENCOUNTER — Other Ambulatory Visit (INDEPENDENT_AMBULATORY_CARE_PROVIDER_SITE_OTHER): Payer: Medicare Other

## 2017-01-01 DIAGNOSIS — E1165 Type 2 diabetes mellitus with hyperglycemia: Secondary | ICD-10-CM | POA: Diagnosis not present

## 2017-01-01 DIAGNOSIS — E1151 Type 2 diabetes mellitus with diabetic peripheral angiopathy without gangrene: Secondary | ICD-10-CM

## 2017-01-01 DIAGNOSIS — IMO0002 Reserved for concepts with insufficient information to code with codable children: Secondary | ICD-10-CM

## 2017-01-02 LAB — COMPREHENSIVE METABOLIC PANEL
ALBUMIN: 4 g/dL (ref 3.5–5.2)
ALT: 18 U/L (ref 0–53)
AST: 17 U/L (ref 0–37)
Alkaline Phosphatase: 61 U/L (ref 39–117)
BUN: 14 mg/dL (ref 6–23)
CHLORIDE: 100 meq/L (ref 96–112)
CO2: 28 mEq/L (ref 19–32)
Calcium: 9.7 mg/dL (ref 8.4–10.5)
Creatinine, Ser: 0.92 mg/dL (ref 0.40–1.50)
GFR: 86.29 mL/min (ref >60.00)
Glucose, Bld: 187 mg/dL — ABNORMAL HIGH (ref 70–99)
POTASSIUM: 4.3 meq/L (ref 3.5–5.1)
Sodium: 135 mEq/L (ref 135–145)
Total Bilirubin: 0.4 mg/dL (ref 0.2–1.2)
Total Protein: 6.9 g/dL (ref 6.0–8.3)

## 2017-01-12 ENCOUNTER — Other Ambulatory Visit: Payer: Self-pay

## 2017-01-12 ENCOUNTER — Other Ambulatory Visit: Payer: Self-pay | Admitting: Internal Medicine

## 2017-01-22 ENCOUNTER — Telehealth: Payer: Self-pay | Admitting: Internal Medicine

## 2017-01-22 NOTE — Telephone Encounter (Signed)
Relation to YS:HUOH Call back [phone] Pharmacy:  Reason for call:  Patient scheduled b12 injection with nurse for 01/27/2017 at 2:45pm.

## 2017-01-27 ENCOUNTER — Ambulatory Visit (INDEPENDENT_AMBULATORY_CARE_PROVIDER_SITE_OTHER): Payer: Medicare Other | Admitting: Behavioral Health

## 2017-01-27 DIAGNOSIS — E538 Deficiency of other specified B group vitamins: Secondary | ICD-10-CM

## 2017-01-27 MED ORDER — CYANOCOBALAMIN 1000 MCG/ML IJ SOLN
1000.0000 ug | Freq: Once | INTRAMUSCULAR | Status: AC
Start: 2017-01-27 — End: 2017-01-27
  Administered 2017-01-27: 1000 ug via INTRAMUSCULAR

## 2017-01-27 NOTE — Progress Notes (Addendum)
Pre visit review using our clinic review tool, if applicable. No additional management support is needed unless otherwise documented below in the visit note.  Patient in clinic today for monthly B12 injection. IM injection given in the left deltoid. Patient tolerated it well. He will call the office at a later time to schedule his next appointment.   Kathlene November, MD

## 2017-01-30 ENCOUNTER — Other Ambulatory Visit: Payer: Self-pay | Admitting: Internal Medicine

## 2017-02-27 DIAGNOSIS — H40013 Open angle with borderline findings, low risk, bilateral: Secondary | ICD-10-CM | POA: Diagnosis not present

## 2017-02-27 DIAGNOSIS — H2513 Age-related nuclear cataract, bilateral: Secondary | ICD-10-CM | POA: Diagnosis not present

## 2017-02-27 DIAGNOSIS — H25013 Cortical age-related cataract, bilateral: Secondary | ICD-10-CM | POA: Diagnosis not present

## 2017-02-27 DIAGNOSIS — E119 Type 2 diabetes mellitus without complications: Secondary | ICD-10-CM | POA: Diagnosis not present

## 2017-03-05 ENCOUNTER — Ambulatory Visit (INDEPENDENT_AMBULATORY_CARE_PROVIDER_SITE_OTHER): Payer: Medicare Other

## 2017-03-05 DIAGNOSIS — E538 Deficiency of other specified B group vitamins: Secondary | ICD-10-CM | POA: Diagnosis not present

## 2017-03-05 MED ORDER — CYANOCOBALAMIN 1000 MCG/ML IJ SOLN
1000.0000 ug | Freq: Once | INTRAMUSCULAR | Status: AC
  Administered 2017-03-05: 1000 ug via INTRAMUSCULAR

## 2017-03-05 NOTE — Progress Notes (Signed)
Pre visit review using our clinic tool,if applicable. No additional management support is needed unless otherwise documented below in the visit note.   Patient in for B12 injection per order from Dr. French Ana due to patient having B12 deficiency.  Given 1000 mcg IM left deltoid. Patient toleratd well. States he will call office to make his next appointment.   Kathlene November, MD

## 2017-04-08 ENCOUNTER — Ambulatory Visit (INDEPENDENT_AMBULATORY_CARE_PROVIDER_SITE_OTHER): Payer: Medicare Other | Admitting: Behavioral Health

## 2017-04-08 DIAGNOSIS — E538 Deficiency of other specified B group vitamins: Secondary | ICD-10-CM | POA: Diagnosis not present

## 2017-04-08 MED ORDER — CYANOCOBALAMIN 1000 MCG/ML IJ SOLN
1000.0000 ug | Freq: Once | INTRAMUSCULAR | Status: AC
  Administered 2017-04-08: 1000 ug via INTRAMUSCULAR

## 2017-04-08 NOTE — Progress Notes (Addendum)
Pre visit review using our clinic review tool, if applicable. No additional management support is needed unless otherwise documented below in the visit note.  Patient came in office for monthly B12 injection. IM injection was given in the left deltoid. Patient tolerated it well. He will call the office at a later date to schedule his next appointment.  Kathlene November, MD

## 2017-04-13 ENCOUNTER — Other Ambulatory Visit: Payer: Self-pay | Admitting: Internal Medicine

## 2017-05-07 ENCOUNTER — Telehealth: Payer: Self-pay | Admitting: Internal Medicine

## 2017-05-07 NOTE — Telephone Encounter (Signed)
Spoke with pt regarding AWV. Pt stated that he wanted to schedule his f/u with Dr. Larose Kells. Informed pt that he is also due for AWV and that both can be scheduled on the same day. Pt's phone disconnected before appointments were made. Pt can schedule AWV after 06/30/2017. SF

## 2017-05-19 ENCOUNTER — Ambulatory Visit (INDEPENDENT_AMBULATORY_CARE_PROVIDER_SITE_OTHER): Payer: Medicare Other | Admitting: Behavioral Health

## 2017-05-19 DIAGNOSIS — E538 Deficiency of other specified B group vitamins: Secondary | ICD-10-CM | POA: Diagnosis not present

## 2017-05-19 MED ORDER — CYANOCOBALAMIN 1000 MCG/ML IJ SOLN
1000.0000 ug | Freq: Once | INTRAMUSCULAR | Status: AC
  Administered 2017-05-19: 1000 ug via INTRAMUSCULAR

## 2017-05-19 NOTE — Progress Notes (Addendum)
Pre visit review using our clinic review tool, if applicable. No additional management support is needed unless otherwise documented below in the visit note.  Patient came in clinic today for monthly B12 injection. IM injection was given in the left deltoid. Patient tolerated it well. He will call the office at a later date to schedule his next appointment. Kathlene November, MD

## 2017-06-15 ENCOUNTER — Telehealth: Payer: Self-pay | Admitting: Internal Medicine

## 2017-06-15 NOTE — Telephone Encounter (Signed)
Relation to TI:RWER Call back number:[phone]   Reason for call:  Patient scheduled b12 with nurse only for 06/26/17 requesting orders and would like flu shot given at the time.

## 2017-06-15 NOTE — Telephone Encounter (Signed)
Pt actually overdue for routine follow-up w/ PCP (see AVS from 12/2016). B12 and flu shot can be completed same day as follow-up w/ PCP.

## 2017-06-17 NOTE — Telephone Encounter (Signed)
Patient scheduled with PCP for 06/26/17 (Fri)

## 2017-06-17 NOTE — Telephone Encounter (Signed)
Noted, thank you

## 2017-06-26 ENCOUNTER — Ambulatory Visit (INDEPENDENT_AMBULATORY_CARE_PROVIDER_SITE_OTHER): Payer: Medicare Other | Admitting: Internal Medicine

## 2017-06-26 ENCOUNTER — Encounter: Payer: Self-pay | Admitting: Internal Medicine

## 2017-06-26 ENCOUNTER — Ambulatory Visit: Payer: Medicare Other

## 2017-06-26 ENCOUNTER — Ambulatory Visit (HOSPITAL_BASED_OUTPATIENT_CLINIC_OR_DEPARTMENT_OTHER)
Admission: RE | Admit: 2017-06-26 | Discharge: 2017-06-26 | Disposition: A | Discharge status: Home / Self Care | Payer: Medicare Other | Source: Ambulatory Visit | Attending: Internal Medicine | Admitting: Internal Medicine

## 2017-06-26 VITALS — BP 118/78 | HR 70 | Temp 98.1°F | Resp 14 | Ht 72.0 in | Wt 195.2 lb

## 2017-06-26 DIAGNOSIS — I10 Essential (primary) hypertension: Secondary | ICD-10-CM | POA: Diagnosis not present

## 2017-06-26 DIAGNOSIS — E118 Type 2 diabetes mellitus with unspecified complications: Secondary | ICD-10-CM | POA: Diagnosis not present

## 2017-06-26 DIAGNOSIS — E538 Deficiency of other specified B group vitamins: Secondary | ICD-10-CM

## 2017-06-26 DIAGNOSIS — M7989 Other specified soft tissue disorders: Secondary | ICD-10-CM | POA: Insufficient documentation

## 2017-06-26 DIAGNOSIS — M1A9XX1 Chronic gout, unspecified, with tophus (tophi): Secondary | ICD-10-CM

## 2017-06-26 DIAGNOSIS — Z23 Encounter for immunization: Secondary | ICD-10-CM | POA: Diagnosis not present

## 2017-06-26 DIAGNOSIS — M79672 Pain in left foot: Secondary | ICD-10-CM | POA: Diagnosis present

## 2017-06-26 DIAGNOSIS — M19072 Primary osteoarthritis, left ankle and foot: Secondary | ICD-10-CM | POA: Diagnosis not present

## 2017-06-26 LAB — CBC WITH DIFFERENTIAL/PLATELET
BASOS PCT: 0.9 % (ref 0.0–3.0)
Basophils Absolute: 0.1 10*3/uL (ref 0.0–0.1)
EOS PCT: 1.6 % (ref 0.0–5.0)
Eosinophils Absolute: 0.2 10*3/uL (ref 0.0–0.7)
HEMATOCRIT: 37.8 % — AB (ref 39.0–52.0)
Hemoglobin: 12.5 g/dL — ABNORMAL LOW (ref 13.0–17.0)
LYMPHS ABS: 1.6 10*3/uL (ref 0.7–4.0)
Lymphocytes Relative: 15.8 % (ref 12.0–46.0)
MCHC: 33 g/dL (ref 30.0–36.0)
MCV: 90.3 fl (ref 78.0–100.0)
Monocytes Absolute: 0.6 10*3/uL (ref 0.1–1.0)
Monocytes Relative: 6.3 % (ref 3.0–12.0)
NEUTROS ABS: 7.5 10*3/uL (ref 1.4–7.7)
NEUTROS PCT: 75.4 % (ref 43.0–77.0)
PLATELETS: 253 10*3/uL (ref 150.0–400.0)
RBC: 4.18 Mil/uL — ABNORMAL LOW (ref 4.22–5.81)
RDW: 13.8 % (ref 11.5–15.5)
WBC: 10 10*3/uL (ref 4.0–10.5)

## 2017-06-26 LAB — BASIC METABOLIC PANEL
BUN: 15 mg/dL (ref 6–23)
CHLORIDE: 99 meq/L (ref 96–112)
CO2: 27 meq/L (ref 19–32)
Calcium: 10.3 mg/dL (ref 8.4–10.5)
Creatinine, Ser: 1 mg/dL (ref 0.40–1.50)
GFR: 78.27 mL/min (ref >60.00)
GLUCOSE: 133 mg/dL — AB (ref 70–99)
POTASSIUM: 5.1 meq/L (ref 3.5–5.1)
SODIUM: 137 meq/L (ref 135–145)

## 2017-06-26 LAB — HEMOGLOBIN A1C: HEMOGLOBIN A1C: 7 % — AB (ref 4.6–6.5)

## 2017-06-26 LAB — URIC ACID: URIC ACID, SERUM: 8.7 mg/dL — AB (ref 4.0–7.8)

## 2017-06-26 MED ORDER — CYANOCOBALAMIN 1000 MCG/ML IJ SOLN
1000.0000 ug | Freq: Once | INTRAMUSCULAR | Status: AC
  Administered 2017-06-26: 1000 ug via INTRAMUSCULAR

## 2017-06-26 NOTE — Progress Notes (Signed)
Pre visit review using our clinic review tool, if applicable. No additional management support is needed unless otherwise documented below in the visit note. 

## 2017-06-26 NOTE — Progress Notes (Signed)
Subjective:    Patient ID: Alex Church, male    DOB: [birth year], 71 y.o.   MRN: [id number]  DOS:  06/26/2017 Type of visit - description : rov Interval history: DM: Doing better with diet, CBGs in the morning still a little high in the 150s, better in the afternoons HTN: Good  med compliance, ambulatory BPs normal Has aches and pains mostly in the hands and knees, takes ibuprofen once weekly.    Review of Systems Denies chest pain or difficulty breathing No fever chills or headaches Some weight loss, reports is due to a better diet.  Past Medical History:  Diagnosis Date  . B12 deficiency    monthly shots  . Diabetes mellitus   . Hyperlipemia   . Hypertension     Past Surgical History:  Procedure Laterality Date  . NO PAST SURGERIES      Social History   Social History  . Marital status: Married    Spouse name: N/A  . Number of children: 0  . Years of education: N/A   Occupational History  . retired from [employer], works for a lab driving    Social History Main Topics  . Smoking status: Never Smoker  . Smokeless tobacco: Never Used  . Alcohol use No  . Drug use: No  . Sexual activity: Not on file   Other Topics Concern  . Not on file   Social History Narrative   Married (2nd marriage)   Lives w/ wife and 1 dog (german sheppard)       Allergies as of 06/26/2017   No Known Allergies     Medication List       Accurate as of 06/26/17  6:11 PM. Always use your most recent med list.          aspirin 81 MG tablet Take 81 mg by mouth daily.   atorvastatin 10 MG tablet Commonly known as:  LIPITOR Take 1 tablet (10 mg total) by mouth at bedtime.   CALCIUM + D PO Take 1,000 mg by mouth daily.   carvedilol 12.5 MG tablet Commonly known as:  COREG Take 1 tablet (12.5 mg total) by mouth 2 (two) times daily with a meal.   cloNIDine 0.2 MG tablet Commonly known as:  CATAPRES Take 1 tablet (0.2 mg total) by mouth 2 (two) times daily.     Glucosamine-Chondroitin 500-400 MG Caps Take 3 capsules by mouth 2 (two) times daily.   JANUVIA 100 MG tablet Generic drug:  sitaGLIPtin Take 1 tablet (100 mg total) by mouth daily.   KRILL OIL OMEGA-3 PO Take by mouth.   lisinopril 20 MG tablet Commonly known as:  PRINIVIL,ZESTRIL Take 2 tablets (40 mg total) by mouth daily.   metFORMIN 1000 MG tablet Commonly known as:  GLUCOPHAGE TAKE 1 & 1/2 TABLETS  BY MOUTH IN THE MORNING AND 1 TABLET IN THE EVENING   multivitamin tablet Take 1 tablet by mouth daily.   NON FORMULARY Take 1 tablet by mouth daily. [place] Grape Seed   ONE TOUCH ULTRA TEST test strip Generic drug:  glucose blood 1 each by Other route as needed. Reported on 02/25/2016          Objective:   Physical Exam BP 118/78 (BP Location: Left Arm, Patient Position: Sitting, Cuff Size: Normal)   Pulse 70   Temp 98.1 F (36.7 C) (Oral)   Resp 14   Ht 6' (1.829 m)   Wt 195 lb 4 oz (88.6 kg)  SpO2 97%   BMI 26.48 kg/m  General:   Well developed, well nourished . NAD.  HEENT:  Normocephalic . Face symmetric, atraumatic Lungs:  CTA B Normal respiratory effort, no intercostal retractions, no accessory muscle use. Heart: RRR,  no murmur.  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 See picture. DIABETIC FEET EXAM: No lower extremity edema Normal pedal pulses bilaterally Skin normal, nails : Thick dystrophic.  Pinprick examination of the feet normal. Neurologic:  alert & oriented X3.  Speech normal, gait appropriate for age and unassisted Psych--  Cognition and judgment appear intact.  Cooperative with normal attention span and concentration.  Behavior appropriate. No anxious or depressed appearing.        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 +FH CAD  PLAN:  DM: on Januvia, metformin, doing better with diet, check A1c HTN: On carvedilol, Catapres, lisinopril. Ambulatory BPs normal, check a BMP and CBC Tophi? See physical exam, check a  uric acid, x-ray of the left foot. 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 Okay to take ibuprofen sporadically. See instructions B12 deficiency: Shot today Thick nails: Probably onychomycosis, discuss treatment, he is asx, hold off for now. Primary care: Flu shot today RTC 6 months, CPX

## 2017-06-26 NOTE — Patient Instructions (Addendum)
GO TO THE LAB : Get the blood work     GO TO THE FRONT DESK Schedule your next appointment for a  physical exam in 6 months   STOP BY THE FIRST FLOOR:  get the XR    IBUPROFEN (Advil or Motrin) 200 mg  1 or 2 tablets a day, 2 o 3 times a week as needed for pain.  Always take it with food because may cause gastritis and ulcers.  If you notice nausea, stomach pain, change in the color of stools --->  Stop the medicine and let us know

## 2017-06-26 NOTE — Assessment & Plan Note (Signed)
DM: on Januvia, metformin, doing better with diet, check A1c HTN: On carvedilol, Catapres, lisinopril. Ambulatory BPs normal, check a BMP and CBC Tophi? See physical exam, check a  uric acid, x-ray of the left foot. 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 Okay to take ibuprofen sporadically. See instructions B12 deficiency: Shot today Thick nails: Probably onychomycosis, discuss treatment, he is asx, hold off for now. Primary care: Flu shot today RTC 6 months, CPX

## 2017-06-29 NOTE — Addendum Note (Signed)
Addended by: Damita Dunnings D on: 06/29/2017 10:16 AM   Modules accepted: Orders

## 2017-07-01 ENCOUNTER — Encounter: Payer: Self-pay | Admitting: Internal Medicine

## 2017-07-27 NOTE — Telephone Encounter (Signed)
Patient scheduled b12 injection for the next available with nurse 08/07/17.

## 2017-08-07 ENCOUNTER — Ambulatory Visit (INDEPENDENT_AMBULATORY_CARE_PROVIDER_SITE_OTHER): Payer: Medicare Other

## 2017-08-07 DIAGNOSIS — E538 Deficiency of other specified B group vitamins: Secondary | ICD-10-CM | POA: Diagnosis not present

## 2017-08-07 MED ORDER — CYANOCOBALAMIN 1000 MCG/ML IJ SOLN
1000.0000 ug | Freq: Once | INTRAMUSCULAR | Status: AC
  Administered 2017-08-07: 1000 ug via INTRAMUSCULAR

## 2017-08-19 DIAGNOSIS — M1A09X1 Idiopathic chronic gout, multiple sites, with tophus (tophi): Secondary | ICD-10-CM | POA: Diagnosis not present

## 2017-08-19 DIAGNOSIS — Z6827 Body mass index (BMI) 27.0-27.9, adult: Secondary | ICD-10-CM | POA: Diagnosis not present

## 2017-08-19 DIAGNOSIS — E663 Overweight: Secondary | ICD-10-CM | POA: Diagnosis not present

## 2017-09-04 ENCOUNTER — Ambulatory Visit (INDEPENDENT_AMBULATORY_CARE_PROVIDER_SITE_OTHER): Payer: Medicare Other

## 2017-09-04 DIAGNOSIS — E538 Deficiency of other specified B group vitamins: Secondary | ICD-10-CM | POA: Diagnosis not present

## 2017-09-04 MED ORDER — CYANOCOBALAMIN 1000 MCG/ML IJ SOLN
1000.0000 ug | Freq: Once | INTRAMUSCULAR | Status: AC
  Administered 2017-09-04: 1000 ug via INTRAMUSCULAR

## 2017-09-04 NOTE — Progress Notes (Addendum)
Pre visit review using our clinic tool,if applicable. No additional management support is needed unless otherwise documented below in the visit note.   Patient in for B12 injection per order from Dr. Larose Kells due to B 12 deficiency complaints voiced this visit.  Given 1000 mcg left deltoid. Patient tolerated well. Return appointment scheduled for 1 month.   Kathlene November, MD

## 2017-09-28 ENCOUNTER — Other Ambulatory Visit: Payer: Self-pay | Admitting: Internal Medicine

## 2017-10-06 ENCOUNTER — Ambulatory Visit (INDEPENDENT_AMBULATORY_CARE_PROVIDER_SITE_OTHER): Payer: Medicare Other

## 2017-10-06 DIAGNOSIS — E538 Deficiency of other specified B group vitamins: Secondary | ICD-10-CM

## 2017-10-06 MED ORDER — CYANOCOBALAMIN 1000 MCG/ML IJ SOLN
1000.0000 ug | Freq: Once | INTRAMUSCULAR | Status: AC
  Administered 2017-10-06: 1000 ug via INTRAMUSCULAR

## 2017-10-06 NOTE — Progress Notes (Addendum)
Pre visit review using our clinic tool,if applicable. No additional management support is needed unless otherwise documented below in the visit note.   Patient in for B12 injection per order from Dr. Kathlene November due to patient having B12 deficiency.  Given 1000 mcg IM left deltoid. Patient tolerated well.  Return appointment given for 1 month.  Kathlene November, MD

## 2017-10-12 ENCOUNTER — Encounter: Payer: Self-pay | Admitting: Internal Medicine

## 2017-10-12 DIAGNOSIS — M1A09X1 Idiopathic chronic gout, multiple sites, with tophus (tophi): Secondary | ICD-10-CM | POA: Diagnosis not present

## 2017-10-12 DIAGNOSIS — E663 Overweight: Secondary | ICD-10-CM | POA: Diagnosis not present

## 2017-10-12 DIAGNOSIS — Z6827 Body mass index (BMI) 27.0-27.9, adult: Secondary | ICD-10-CM | POA: Diagnosis not present

## 2017-10-12 LAB — HEPATIC FUNCTION PANEL
ALT: 22 (ref 10–40)
AST: 19 (ref 14–40)
Alkaline Phosphatase: 65 (ref 25–125)
Bilirubin, Total: 0.2

## 2017-10-12 LAB — BASIC METABOLIC PANEL
BUN: 15 (ref 4–21)
CREATININE: 0.9 (ref 0.6–1.3)
GLUCOSE: 126
Potassium: 5 (ref 3.4–5.3)
Sodium: 139 (ref 137–147)

## 2017-10-12 LAB — CBC AND DIFFERENTIAL
HCT: 36 — AB (ref 41–53)
Hemoglobin: 11.9 — AB (ref 13.5–17.5)
NEUTROS ABS: 5
PLATELETS: 190 (ref 150–399)
WBC: 8.7

## 2017-10-13 ENCOUNTER — Other Ambulatory Visit: Payer: Self-pay | Admitting: Internal Medicine

## 2017-10-19 ENCOUNTER — Other Ambulatory Visit: Payer: Self-pay | Admitting: Internal Medicine

## 2017-11-02 ENCOUNTER — Other Ambulatory Visit: Payer: Self-pay | Admitting: Internal Medicine

## 2017-11-05 ENCOUNTER — Ambulatory Visit (INDEPENDENT_AMBULATORY_CARE_PROVIDER_SITE_OTHER): Payer: Medicare Other

## 2017-11-05 DIAGNOSIS — E538 Deficiency of other specified B group vitamins: Secondary | ICD-10-CM | POA: Diagnosis not present

## 2017-11-05 MED ORDER — CYANOCOBALAMIN 1000 MCG/ML IJ SOLN
1000.0000 ug | Freq: Once | INTRAMUSCULAR | Status: AC
  Administered 2017-11-05: 1000 ug via INTRAMUSCULAR

## 2017-11-05 NOTE — Progress Notes (Addendum)
Pre visit review using our clinic review tool, if applicable. No additional management support is needed unless otherwise documented below in the visit note.   Pt here today for B12 injection. 12mL injected into L deltoid. Pt tolerated injection well.   Next B12 shot in 4 weeks. Nurse visit scheduled 12/03/2017.   Kathlene November, MD

## 2017-12-03 ENCOUNTER — Ambulatory Visit (INDEPENDENT_AMBULATORY_CARE_PROVIDER_SITE_OTHER): Payer: Medicare Other

## 2017-12-03 DIAGNOSIS — E538 Deficiency of other specified B group vitamins: Secondary | ICD-10-CM

## 2017-12-03 MED ORDER — CYANOCOBALAMIN 1000 MCG/ML IJ SOLN
1000.0000 ug | Freq: Once | INTRAMUSCULAR | Status: AC
  Administered 2017-12-03: 1000 ug via INTRAMUSCULAR

## 2017-12-03 NOTE — Progress Notes (Signed)
Patient came in to have his B-12 injection   He tolerated it well 33mL in his Left Deltoid

## 2017-12-28 ENCOUNTER — Encounter: Payer: Self-pay | Admitting: Internal Medicine

## 2017-12-28 DIAGNOSIS — M1A09X1 Idiopathic chronic gout, multiple sites, with tophus (tophi): Secondary | ICD-10-CM | POA: Diagnosis not present

## 2017-12-28 LAB — BASIC METABOLIC PANEL
BUN: 15 (ref 4–21)
Creatinine: 1 (ref 0.6–1.3)
Glucose: 126
POTASSIUM: 4.7 (ref 3.4–5.3)
Sodium: 137 (ref 137–147)

## 2017-12-28 LAB — HEPATIC FUNCTION PANEL
ALT: 23 (ref 10–40)
AST: 22 (ref 14–40)
Alkaline Phosphatase: 65 (ref 25–125)
Bilirubin, Total: 0.3

## 2017-12-28 LAB — CBC AND DIFFERENTIAL
HCT: 36 — AB (ref 41–53)
HEMOGLOBIN: 12.2 — AB (ref 13.5–17.5)
Neutrophils Absolute: 5
Platelets: 205 (ref 150–399)
WBC: 7.5

## 2018-01-01 ENCOUNTER — Ambulatory Visit (INDEPENDENT_AMBULATORY_CARE_PROVIDER_SITE_OTHER): Payer: Medicare Other

## 2018-01-01 DIAGNOSIS — E538 Deficiency of other specified B group vitamins: Secondary | ICD-10-CM

## 2018-01-01 MED ORDER — CYANOCOBALAMIN 1000 MCG/ML IJ SOLN
1000.0000 ug | Freq: Once | INTRAMUSCULAR | Status: AC
  Administered 2018-01-01: 1000 ug via INTRAMUSCULAR

## 2018-01-01 NOTE — Progress Notes (Signed)
Patient tolerated 1 ML in his left deltoid.

## 2018-01-25 ENCOUNTER — Encounter: Payer: Self-pay | Admitting: Internal Medicine

## 2018-01-25 ENCOUNTER — Ambulatory Visit (INDEPENDENT_AMBULATORY_CARE_PROVIDER_SITE_OTHER): Payer: Medicare Other | Admitting: Internal Medicine

## 2018-01-25 ENCOUNTER — Telehealth: Payer: Self-pay

## 2018-01-25 VITALS — BP 120/74 | HR 66 | Temp 97.7°F | Ht 72.0 in | Wt 198.5 lb

## 2018-01-25 DIAGNOSIS — Z125 Encounter for screening for malignant neoplasm of prostate: Secondary | ICD-10-CM | POA: Diagnosis not present

## 2018-01-25 DIAGNOSIS — Z23 Encounter for immunization: Secondary | ICD-10-CM | POA: Diagnosis not present

## 2018-01-25 DIAGNOSIS — M1A9XX1 Chronic gout, unspecified, with tophus (tophi): Secondary | ICD-10-CM

## 2018-01-25 DIAGNOSIS — E538 Deficiency of other specified B group vitamins: Secondary | ICD-10-CM

## 2018-01-25 DIAGNOSIS — E1143 Type 2 diabetes mellitus with diabetic autonomic (poly)neuropathy: Secondary | ICD-10-CM

## 2018-01-25 DIAGNOSIS — E782 Mixed hyperlipidemia: Secondary | ICD-10-CM

## 2018-01-25 DIAGNOSIS — D649 Anemia, unspecified: Secondary | ICD-10-CM | POA: Diagnosis not present

## 2018-01-25 MED ORDER — CYANOCOBALAMIN 1000 MCG/ML IJ SOLN
1000.0000 ug | Freq: Once | INTRAMUSCULAR | Status: AC
  Administered 2018-01-25: 1000 ug via INTRAMUSCULAR

## 2018-01-25 NOTE — Progress Notes (Signed)
Subjective:    Patient ID: Alex Church, male    DOB: 12-28-1945, 72 y.o.   MRN: [id number]  DOS:  01/25/2018 Type of visit - description : Follow-up Interval history: DM: Ambulatory CBGs in the 180 in the morning, 120 in the afternoon HTN: Good compliance with meds, ambulatory BPs within normal. B12 deficiency: Request a shot Gout: Since the last visit, was diagnosed with gout, sees rheumatology regularly, doing great.   Review of Systems Denies chest pain or difficulty breathing No nausea, no vomiting, occasionally has diarrhea. No dysuria, difficulty urinating no gross hematuria  Past Medical History:  Diagnosis Date  . B12 deficiency    monthly shots  . Diabetes mellitus   . Hyperlipemia   . Hypertension     Past Surgical History:  Procedure Laterality Date  . NO PAST SURGERIES      Social History   Socioeconomic History  . Marital status: Married    Spouse name: Not on file  . Number of children: 0  . Years of education: Not on file  . Highest education level: Not on file  Occupational History  . Occupation: retired from [employer], works for a lab driving  Social Needs  . Financial resource strain: Not on file  . Food insecurity:    Worry: Not on file    Inability: Not on file  . Transportation needs:    Medical: Not on file    Non-medical: Not on file  Tobacco Use  . Smoking status: Never Smoker  . Smokeless tobacco: Never Used  Substance and Sexual Activity  . Alcohol use: No  . Drug use: No  . Sexual activity: Not on file  Lifestyle  . Physical activity:    Days per week: Not on file    Minutes per session: Not on file  . Stress: Not on file  Relationships  . Social connections:    Talks on phone: Not on file    Gets together: Not on file    Attends religious service: Not on file    Active member of club or organization: Not on file    Attends meetings of clubs or organizations: Not on file    Relationship status: Not on file  . Intimate  partner violence:    Fear of current or ex partner: Not on file    Emotionally abused: Not on file    Physically abused: Not on file    Forced sexual activity: Not on file  Other Topics Concern  . Not on file  Social History Narrative   Married (2nd marriage)   Lives w/ wife and 1 dog (german sheppard)       Allergies as of 01/25/2018   No Known Allergies     Medication List        Accurate as of 01/25/18 11:59 PM. Always use your most recent med list.          allopurinol 100 MG tablet Commonly known as:  ZYLOPRIM Take 100 mg by mouth 2 (two) times daily.   aspirin 81 MG tablet Take 81 mg by mouth daily.   atorvastatin 10 MG tablet Commonly known as:  LIPITOR Take 1 tablet (10 mg total) by mouth at bedtime.   CALCIUM + D PO Take 1,000 mg by mouth daily.   carvedilol 12.5 MG tablet Commonly known as:  COREG Take 1 tablet (12.5 mg total) by mouth 2 (two) times daily with a meal.   cloNIDine 0.2 MG tablet Commonly  known as:  CATAPRES Take 1 tablet (0.2 mg total) by mouth 2 (two) times daily.   colchicine 0.6 MG tablet Take 0.6 mg by mouth daily.   Glucosamine-Chondroitin 500-400 MG Caps Take 3 capsules by mouth 2 (two) times daily.   JANUVIA 100 MG tablet Generic drug:  sitaGLIPtin Take 1 tablet (100 mg total) by mouth daily.   KRILL OIL OMEGA-3 PO Take by mouth.   lisinopril 20 MG tablet Commonly known as:  PRINIVIL,ZESTRIL Take 1 tablet (20 mg total) by mouth 2 (two) times daily.   metFORMIN 1000 MG tablet Commonly known as:  GLUCOPHAGE Take 1 and 1/2 tablet by mouth every morning and 1 tablet by mouth every evening   multivitamin tablet Take 1 tablet by mouth daily.   NON FORMULARY Take 1 tablet by mouth daily. [place] Grape Seed   ONE TOUCH ULTRA TEST test strip Generic drug:  glucose blood 1 each by Other route as needed. Reported on 02/25/2016          Objective:   Physical Exam BP 120/74 (BP Location: Left Arm, Patient Position:  Sitting, Cuff Size: Normal)   Pulse 66   Temp 97.7 F (36.5 C) (Oral)   Ht 6' (1.829 m)   Wt 198 lb 8 oz (90 kg)   SpO2 95%   BMI 26.92 kg/m  General:   Well developed, well nourished . NAD.  Neck: No  thyromegaly  HEENT:  Normocephalic . Face symmetric, atraumatic Lungs:  CTA B Normal respiratory effort, no intercostal retractions, no accessory muscle use. Heart: RRR,  no murmur.  No pretibial edema bilaterally  Abdomen:  Not distended, soft, non-tender. No rebound or rigidity.   Rectal: External abnormalities: none. Normal sphincter tone. No rectal masses or tenderness.  No stools found Prostate: Prostate gland firm and smooth, no enlargement, nodularity, tenderness, mass, asymmetry or induration Neurologic:  alert & oriented X3.  Speech normal, gait appropriate for age and unassisted Strength symmetric and appropriate for age.  Psych: Cognition and judgment appear intact.  Cooperative with normal attention span and concentration.  Behavior appropriate. No anxious or depressed appearing.     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 +FH CAD  PLAN: Labs November 09, 2017 CBC with a hemoglobin of 11.9, CMP with a creatinine of 0.8.  Uric acid 6.5 Labs December 28, 2017  CBC with a hemoglobin of 12.2, uric acid 6.0. CMP with a creatinine 0.9, LFTs normal. DM: Continue Januvia, metformin, checking A1c HTN: Seems controlled on carvedilol, Catapres, lisinopril.  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  Per rheumatology Mild anemia noted: No GI symptoms, never had a colonoscopy, CBC with normal MCV and MCH.   Check hemoglobin, iron and ferritin.  If iron deficiency noted, will cancel Cologuard and proceed for full GI eval. Preventive  care discussed.  See separate documentation RTC 4 months

## 2018-01-25 NOTE — Assessment & Plan Note (Signed)
-  Td 2010;  pnm 23 today ;  pnm 13: 12/2016; zostavax : got it before?.  Shingrix not available --CCS: Never had a scope, this year he also declined to have one; iFOB vs cologuard discussed, elected cologuard. --Prostate cancer screening: DRE normal, check a PSA

## 2018-01-25 NOTE — Patient Instructions (Signed)
GO TO THE LAB : Get the blood work     GO TO THE FRONT DESK Schedule your next appointment for a   checkup in 4 months  

## 2018-01-25 NOTE — Telephone Encounter (Signed)
Cologuard ordered through [employer] portal. Order number: [id number].

## 2018-01-25 NOTE — Progress Notes (Signed)
Pre visit review using our clinic review tool, if applicable. No additional management support is needed unless otherwise documented below in the visit note. 

## 2018-01-26 LAB — HEMOGLOBIN: Hemoglobin: 12.8 g/dL — ABNORMAL LOW (ref 13.0–17.0)

## 2018-01-26 LAB — LIPID PANEL
CHOL/HDL RATIO: 3
Cholesterol: 95 mg/dL (ref 0–200)
HDL: 35.7 mg/dL — ABNORMAL LOW (ref >39.00)
LDL Cholesterol: 38 mg/dL (ref 0–99)
NONHDL: 58.97
Triglycerides: 103 mg/dL (ref 0.0–149.0)
VLDL: 20.6 mg/dL (ref 0.0–40.0)

## 2018-01-26 LAB — PSA: PSA: 1.8 ng/mL (ref 0.10–4.00)

## 2018-01-26 LAB — HEMOGLOBIN A1C: Hgb A1c MFr Bld: 7.1 % — ABNORMAL HIGH (ref 4.6–6.5)

## 2018-01-26 LAB — IRON: Iron: 109 ug/dL (ref 42–165)

## 2018-01-26 LAB — FERRITIN: Ferritin: 69.4 ng/mL (ref 22.0–322.0)

## 2018-01-27 DIAGNOSIS — M1A9XX1 Chronic gout, unspecified, with tophus (tophi): Secondary | ICD-10-CM | POA: Insufficient documentation

## 2018-01-27 NOTE — Assessment & Plan Note (Signed)
Labs November 09, 2017 CBC with a hemoglobin of 11.9, CMP with a creatinine of 0.8.  Uric acid 6.5 Labs December 28, 2017  CBC with a hemoglobin of 12.2, uric acid 6.0. CMP with a creatinine 0.9, LFTs normal. DM: Continue Januvia, metformin, checking A1c HTN: Seems controlled on carvedilol, Catapres, lisinopril.  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  Per rheumatology Mild anemia noted: No GI symptoms, never had a colonoscopy, CBC with normal MCV and MCH.   Check hemoglobin, iron and ferritin.  If iron deficiency noted, will cancel Cologuard and proceed for full GI eval. Preventive care discussed.  See separate documentation RTC 4 months

## 2018-03-04 ENCOUNTER — Ambulatory Visit (INDEPENDENT_AMBULATORY_CARE_PROVIDER_SITE_OTHER): Payer: Medicare Other

## 2018-03-04 DIAGNOSIS — E538 Deficiency of other specified B group vitamins: Secondary | ICD-10-CM | POA: Diagnosis not present

## 2018-03-04 MED ORDER — CYANOCOBALAMIN 1000 MCG/ML IJ SOLN
1000.0000 ug | Freq: Once | INTRAMUSCULAR | Status: AC
  Administered 2018-03-04: 1000 ug via INTRAMUSCULAR

## 2018-03-04 NOTE — Addendum Note (Signed)
Addended by: Wynonia Musty A on: 03/04/2018 02:58 PM   Modules accepted: Orders

## 2018-03-04 NOTE — Progress Notes (Addendum)
Pt here for monthly B12 injection per PCP Dr. Larose Kells  Last B12 injection 01/01/18.  B12 105mcg given IM, and pt tolerated injection well. Injection given in left Deltoid per patient request.  Next B12 injection scheduled for 04/06/18 at 2:45.  Kathlene November, MD

## 2018-03-29 ENCOUNTER — Other Ambulatory Visit: Payer: Self-pay | Admitting: Internal Medicine

## 2018-04-06 ENCOUNTER — Ambulatory Visit (INDEPENDENT_AMBULATORY_CARE_PROVIDER_SITE_OTHER): Payer: Medicare Other

## 2018-04-06 DIAGNOSIS — E538 Deficiency of other specified B group vitamins: Secondary | ICD-10-CM

## 2018-04-06 MED ORDER — CYANOCOBALAMIN 1000 MCG/ML IJ SOLN
1000.0000 ug | Freq: Once | INTRAMUSCULAR | Status: AC
  Administered 2018-04-06: 1000 ug via INTRAMUSCULAR

## 2018-04-06 NOTE — Progress Notes (Addendum)
Pre visit review using our clinic tool,if applicable. No additional management support is needed unless otherwise documented below in the visit note.   Pt here for monthly B12 injection per order from Dr. Larose Kells.   B12 1064mcg given IM Left Deltoid, patient tolerated injection well.  No complaints voiced this visit.   Next B12 injection scheduled for May 06, 2018. Kathlene November, MD

## 2018-04-08 DIAGNOSIS — H2513 Age-related nuclear cataract, bilateral: Secondary | ICD-10-CM | POA: Diagnosis not present

## 2018-04-08 DIAGNOSIS — H40013 Open angle with borderline findings, low risk, bilateral: Secondary | ICD-10-CM | POA: Diagnosis not present

## 2018-04-08 DIAGNOSIS — H25013 Cortical age-related cataract, bilateral: Secondary | ICD-10-CM | POA: Diagnosis not present

## 2018-04-08 DIAGNOSIS — E119 Type 2 diabetes mellitus without complications: Secondary | ICD-10-CM | POA: Diagnosis not present

## 2018-04-08 LAB — HM DIABETES EYE EXAM

## 2018-04-20 DIAGNOSIS — M1A09X1 Idiopathic chronic gout, multiple sites, with tophus (tophi): Secondary | ICD-10-CM | POA: Diagnosis not present

## 2018-04-20 DIAGNOSIS — E663 Overweight: Secondary | ICD-10-CM | POA: Diagnosis not present

## 2018-04-20 DIAGNOSIS — Z6827 Body mass index (BMI) 27.0-27.9, adult: Secondary | ICD-10-CM | POA: Diagnosis not present

## 2018-04-20 LAB — BASIC METABOLIC PANEL
BUN: 13 (ref 4–21)
CREATININE: 1.1 (ref 0.6–1.3)
Glucose: 109
Potassium: 4.6 (ref 3.4–5.3)
Sodium: 138 (ref 137–147)

## 2018-04-20 LAB — HEPATIC FUNCTION PANEL
ALK PHOS: 63 (ref 25–125)
ALT: 24 (ref 10–40)
AST: 23 (ref 14–40)
BILIRUBIN, TOTAL: 0.4

## 2018-04-21 ENCOUNTER — Encounter: Payer: Self-pay | Admitting: Internal Medicine

## 2018-04-22 ENCOUNTER — Encounter: Payer: Self-pay | Admitting: Internal Medicine

## 2018-05-06 ENCOUNTER — Ambulatory Visit (INDEPENDENT_AMBULATORY_CARE_PROVIDER_SITE_OTHER): Payer: Medicare Other

## 2018-05-06 DIAGNOSIS — E538 Deficiency of other specified B group vitamins: Secondary | ICD-10-CM | POA: Diagnosis not present

## 2018-05-06 MED ORDER — CYANOCOBALAMIN 1000 MCG/ML IJ SOLN
1000.0000 ug | Freq: Once | INTRAMUSCULAR | Status: AC
  Administered 2018-05-06: 1000 ug via INTRAMUSCULAR

## 2018-05-06 NOTE — Progress Notes (Signed)
Pre visit review using our clinic tool,if applicable. No additional management support is needed unless otherwise documented below in the visit note.   Pt here for monthly B12 injection per order from Dr. Kathlene November.  B12 1000 mcg given IM right deltoid,  pt tolerated injection well. No complaints voiced  This visit.   Next B12 injection scheduled for visit. June 03, 2018. Patient aware.

## 2018-05-24 ENCOUNTER — Encounter: Payer: Self-pay | Admitting: Internal Medicine

## 2018-05-24 MED ORDER — GLUCOSE BLOOD VI STRP: ORAL_STRIP | 12 refills | Status: DC

## 2018-05-24 MED ORDER — ONETOUCH LANCETS MISC: 12 refills | Status: DC

## 2018-05-24 MED ORDER — ONETOUCH VERIO FLEX SYSTEM W/DEVICE KIT: PACK | 0 refills | Status: DC

## 2018-06-03 ENCOUNTER — Ambulatory Visit: Payer: Medicare Other

## 2018-06-04 ENCOUNTER — Ambulatory Visit (INDEPENDENT_AMBULATORY_CARE_PROVIDER_SITE_OTHER): Payer: Medicare Other

## 2018-06-04 DIAGNOSIS — E538 Deficiency of other specified B group vitamins: Secondary | ICD-10-CM

## 2018-06-04 MED ORDER — CYANOCOBALAMIN 1000 MCG/ML IJ SOLN
1000.0000 ug | Freq: Once | INTRAMUSCULAR | Status: AC
  Administered 2018-06-04: 1000 ug via INTRAMUSCULAR

## 2018-06-04 NOTE — Progress Notes (Signed)
Pre visit review using our clinic tool,if applicable. No additional management support is needed unless otherwise documented below in the visit note.   Pt here for monthly B12 injection per ordered from Dr. Kathlene November.  B12 1033mcg given IM left deltoid,  pt tolerated injection well.  Next B12 injection scheduled for 1 month.

## 2018-06-28 ENCOUNTER — Other Ambulatory Visit: Payer: Self-pay | Admitting: Internal Medicine

## 2018-07-02 ENCOUNTER — Ambulatory Visit (INDEPENDENT_AMBULATORY_CARE_PROVIDER_SITE_OTHER): Payer: Medicare Other

## 2018-07-02 DIAGNOSIS — Z23 Encounter for immunization: Secondary | ICD-10-CM | POA: Diagnosis not present

## 2018-07-02 DIAGNOSIS — E538 Deficiency of other specified B group vitamins: Secondary | ICD-10-CM | POA: Diagnosis not present

## 2018-07-02 MED ORDER — CYANOCOBALAMIN 1000 MCG/ML IJ SOLN
1000.0000 ug | Freq: Once | INTRAMUSCULAR | Status: AC
  Administered 2018-07-02: 1000 ug via INTRAMUSCULAR

## 2018-07-26 ENCOUNTER — Other Ambulatory Visit: Payer: Self-pay | Admitting: Internal Medicine

## 2018-07-27 ENCOUNTER — Encounter: Payer: Self-pay | Admitting: Internal Medicine

## 2018-07-30 NOTE — Progress Notes (Addendum)
Subjective:   Alex Church is a 72 y.o. male who presents for Medicare Annual/Subsequent preventive examination.  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  Review of Systems: No ROS.  Medicare Wellness Visit. Additional risk factors are reflected in the social history. Cardiac Risk Factors include: advanced age (>75mn, >>84women);diabetes mellitus;dyslipidemia;hypertension;male gender Sleep patterns:  Sleeps 5-6 hrs. Pt states that is plenty for him. Home Safety/Smoke Alarms: Feels safe in home. Smoke alarms in place.   Eye- Dr.Weaver yearly. UTD per pt.  Male:   CCS- declines     PSA-  Lab Results  Component Value Date   PSA 1.80 01/25/2018   PSA 1.40 08/12/2016   PSA 1.61 01/04/2015       Objective:    Vitals: BP 126/70 (BP Location: Left Arm, Patient Position: Sitting, Cuff Size: Normal)   Pulse (!) 57   Ht 6' (1.829 m)   Wt 196 lb (88.9 kg)   SpO2 98%   BMI 26.58 kg/m   Body mass index is 26.58 kg/m.  Advanced Directives 08/02/2018  Does Patient Have a Medical Advance Directive? Yes  Type of [place]  Copy of [place] Chart? No - copy requested    Tobacco Social History   Tobacco Use  Smoking Status Never Smoker  Smokeless Tobacco Never Used     Counseling given: Not Answered   Clinical Intake:     Pain : No/denies pain                 Past Medical History:  Diagnosis Date  . B12 deficiency    monthly shots  . Diabetes mellitus   . Hyperlipemia   . Hypertension    Past Surgical History:  Procedure Laterality Date  . NO PAST SURGERIES     Family History  Problem Relation Age of Onset  . Diabetes Maternal Uncle   . Heart attack Brother 43 . Cancer Mother        intra-abdominal  . Cirrhosis Cousin        maternal  . Diabetes Cousin        maternal  . Stroke Neg Hx   . Colon cancer Neg Hx   . Prostate cancer Neg Hx    Social History     Socioeconomic History  . Marital status: Married    Spouse name: Not on file  . Number of children: 0  . Years of education: Not on file  . Highest education level: Not on file  Occupational History  . 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 Financial resource strain: Not on file  . Food insecurity:    Worry: Not on file    Inability: Not on file  . Transportation needs:    Medical: Not on file    Non-medical: Not on file  Tobacco Use  . Smoking status: Never Smoker  . Smokeless tobacco: Never Used  Substance and Sexual Activity  . Alcohol use: No  . Drug use: No  . Sexual activity: Not on file  Lifestyle  . Physical activity:    Days per week: Not on file    Minutes per session: Not on file  . Stress: Not on file  Relationships  . Social connections:    Talks on phone: Not on file    Gets together: Not on file    Attends religious service: Not  on file    Active member of club or organization: Not on file    Attends meetings of clubs or organizations: Not on file    Relationship status: Not on file  Other Topics Concern  . Not on file  Social History Narrative   Married (2nd marriage)   Lives w/ wife and 1 dog (german sheppard)     Outpatient Encounter Medications as of 08/02/2018  Medication Sig  . allopurinol (ZYLOPRIM) 100 MG tablet Take 100 mg by mouth 2 (two) times daily.  Marland Kitchen aspirin 81 MG tablet Take 81 mg by mouth daily.    Marland Kitchen atorvastatin (LIPITOR) 10 MG tablet Take 1 tablet (10 mg total) by mouth at bedtime.  . Blood Glucose Monitoring Suppl ([place]) w/Device KIT Check blood sugar once daily  . Calcium Carbonate-Vitamin D (CALCIUM + D PO) Take 1,000 mg by mouth daily.    . carvedilol (COREG) 12.5 MG tablet Take 1 tablet (12.5 mg total) by mouth 2 (two) times daily with a meal.  . cloNIDine (CATAPRES) 0.2 MG tablet Take 1 tablet (0.2 mg total) by mouth 2 (two) times daily.  . colchicine 0.6 MG tablet  Take 0.6 mg by mouth daily.  . Glucosamine-Chondroitin 500-400 MG CAPS Take 3 capsules by mouth 2 (two) times daily.  Marland Kitchen glucose blood (ONE TOUCH ULTRA TEST) test strip Check blood sugar once daily  . JANUVIA 100 MG tablet Take 1 tablet (100 mg total) by mouth daily.  Marland Kitchen KRILL OIL OMEGA-3 PO Take by mouth.  Marland Kitchen lisinopril (PRINIVIL,ZESTRIL) 20 MG tablet Take 1 tablet (20 mg total) by mouth 2 (two) times daily.  . meloxicam (MOBIC) 7.5 MG tablet Take 1 tablet (7.5 mg total) by mouth daily as needed for pain.  . metFORMIN (GLUCOPHAGE) 1000 MG tablet TAKE 1 AND 1/2 TABLETS BY MOUTH EVERY MORNING AND 1 TABLET BY MOUTH EVERY EVENING  . Multiple Vitamin (MULTIVITAMIN) tablet Take 1 tablet by mouth daily.    . NON FORMULARY Take 1 tablet by mouth daily. [place] Grape Seed  . ONE TOUCH LANCETS MISC Check blood sugar once daily   No facility-administered encounter medications on file as of 08/02/2018.     Activities of Daily Living In your present state of health, do you have any difficulty performing the following activities: 08/02/2018 01/25/2018  Hearing? N N  Vision? N N  Difficulty concentrating or making decisions? N N  Walking or climbing stairs? N N  Dressing or bathing? N N  Doing errands, shopping? N N  Preparing Food and eating ? N -  Using the Toilet? N -  In the past six months, have you accidently leaked urine? N -  Do you have problems with loss of bowel control? N -  Managing your Medications? N -  Managing your Finances? N -  Housekeeping or managing your Housekeeping? N -  Some recent data might be hidden    Patient Care Team: Colon Branch, MD as PCP - General (Internal Medicine) Monna Fam, MD as Consulting Physician (Ophthalmology) Prudencio Pair as Physician Assistant (Rheumatology)   Assessment:   This is a routine wellness examination for Alex Church. Physical assessment deferred to PCP.  Exercise Activities and Dietary recommendations Current Exercise  Habits: Home exercise routine, Type of exercise: walking, Time (Minutes): 40, Frequency (Times/Week): 3, Weekly Exercise (Minutes/Week): 120, Intensity: Mild, Exercise limited by: None identified   Diet (meal preparation, eat out, water intake, caffeinated beverages, dairy products, fruits and vegetables):  24 hr recall Breakfast: cereal or bagel Lunch: nabs Dinner:  Sandwich with chips    Goals    . Maintain healthy active lifestyle.       Fall Risk Fall Risk  08/02/2018 01/25/2018 06/30/2016 02/25/2016 01/04/2015  Falls in the past year? 0 No No No No   Depression Screen PHQ 2/9 Scores 08/02/2018 01/25/2018 06/30/2016 02/25/2016  PHQ - 2 Score 0 0 0 0    Cognitive Function    Ad8 score reviewed for issues:  Issues making decisions:no  Less interest in hobbies / activities:no  Repeats questions, stories (family complaining):no  Trouble using ordinary gadgets (microwave, computer, phone):no  Forgets the month or year: no  Mismanaging finances: no  Remembering appts:no  Daily problems with thinking and/or memory:no Ad8 score is=0     Immunization History  Administered Date(s) Administered  . Influenza Split 06/04/2012  . Influenza, High Dose Seasonal PF 06/27/2015, 06/30/2016, 06/26/2017, 07/02/2018  . Influenza,inj,Quad PF,6+ Mos 06/17/2013, 06/23/2014  . Pneumococcal Conjugate-13 12/19/2016  . Pneumococcal Polysaccharide-23 01/25/2018  . Td 09/08/2008    Screening Tests Health Maintenance  Topic Date Due  . FOOT EXAM  06/26/2018  . HEMOGLOBIN A1C  07/28/2018  . COLONOSCOPY  08/03/2019 (Originally 05/16/1996)  . TETANUS/TDAP  09/08/2018  . OPHTHALMOLOGY EXAM  04/09/2019  . INFLUENZA VACCINE  Completed  . Hepatitis C Screening  Completed  . PNA vac Low Risk Adult  Completed        Plan:    Please schedule your next medicare wellness visit with me in 1 yr.  Continue to eat heart healthy diet (full of fruits, vegetables, whole grains, lean protein,  water--limit salt, fat, and sugar intake) and increase physical activity as tolerated.  Bring a copy of your living will and/or healthcare power of attorney to your next office visit.    I have personally reviewed and noted the following in the patient's chart:   . Medical and social history . Use of alcohol, tobacco or illicit drugs  . Current medications and supplements . Functional ability and status . Nutritional status . Physical activity . Advanced directives . List of other physicians . Hospitalizations, surgeries, and ER visits in previous 12 months . Vitals . Screenings to include cognitive, depression, and falls . Referrals and appointments  In addition, I have reviewed and discussed with patient certain preventive protocols, quality metrics, and best practice recommendations. A written personalized care plan for preventive services as well as general preventive health recommendations were provided to patient.     Naaman Plummer [place], [place]  08/02/2018  Kathlene November, MD

## 2018-08-02 ENCOUNTER — Encounter: Payer: Self-pay | Admitting: *Deleted

## 2018-08-02 ENCOUNTER — Encounter: Payer: Self-pay | Admitting: Internal Medicine

## 2018-08-02 ENCOUNTER — Telehealth: Payer: Self-pay

## 2018-08-02 ENCOUNTER — Ambulatory Visit (INDEPENDENT_AMBULATORY_CARE_PROVIDER_SITE_OTHER): Payer: Medicare Other | Admitting: *Deleted

## 2018-08-02 ENCOUNTER — Ambulatory Visit (INDEPENDENT_AMBULATORY_CARE_PROVIDER_SITE_OTHER): Payer: Medicare Other | Admitting: Internal Medicine

## 2018-08-02 VITALS — BP 126/70 | HR 57 | Temp 98.2°F | Resp 16 | Ht 72.0 in | Wt 196.4 lb

## 2018-08-02 VITALS — BP 126/70 | HR 57 | Ht 72.0 in | Wt 196.0 lb

## 2018-08-02 DIAGNOSIS — M7712 Lateral epicondylitis, left elbow: Secondary | ICD-10-CM | POA: Diagnosis not present

## 2018-08-02 DIAGNOSIS — Z0000 Encounter for general adult medical examination without abnormal findings: Secondary | ICD-10-CM

## 2018-08-02 DIAGNOSIS — R008 Other abnormalities of heart beat: Secondary | ICD-10-CM

## 2018-08-02 DIAGNOSIS — E538 Deficiency of other specified B group vitamins: Secondary | ICD-10-CM

## 2018-08-02 DIAGNOSIS — I10 Essential (primary) hypertension: Secondary | ICD-10-CM | POA: Diagnosis not present

## 2018-08-02 DIAGNOSIS — E1169 Type 2 diabetes mellitus with other specified complication: Secondary | ICD-10-CM | POA: Diagnosis not present

## 2018-08-02 DIAGNOSIS — R790 Abnormal level of blood mineral: Secondary | ICD-10-CM

## 2018-08-02 DIAGNOSIS — M1A9XX1 Chronic gout, unspecified, with tophus (tophi): Secondary | ICD-10-CM

## 2018-08-02 MED ORDER — CYANOCOBALAMIN 1000 MCG/ML IJ SOLN
1000.0000 ug | Freq: Once | INTRAMUSCULAR | Status: AC
  Administered 2018-08-02: 1000 ug via INTRAMUSCULAR

## 2018-08-02 MED ORDER — MELOXICAM 7.5 MG PO TABS: 7.5000 mg | ORAL_TABLET | Freq: Every day | ORAL | 0 refills | Status: DC | PRN

## 2018-08-02 NOTE — Progress Notes (Signed)
Pre visit review using our clinic review tool, if applicable. No additional management support is needed unless otherwise documented below in the visit note. 

## 2018-08-02 NOTE — Patient Instructions (Addendum)
GO TO THE LAB : Get the blood work     GO TO THE FRONT DESK Schedule your next appointment for a  Check up in 6 months   Take meloxicam with food as needed for elbow pain, if no better start using the elbow brace.  If that is not helping, please call for a referral.

## 2018-08-02 NOTE — Telephone Encounter (Signed)
Ordered through [employer]

## 2018-08-02 NOTE — Progress Notes (Signed)
Subjective:    Patient ID: Alex Church, male    DOB: 27-Jun-1946, 72 y.o.   MRN: [id number]  DOS:  08/02/2018 Type of visit - description : rov DM: Doing better with diet and exercise, due for an A1c 2 months ago, he did some work in his yard with a pressure washer, since then is having pain at the left elbow, has taken meloxicam from his wife as needed and the pain decreased. Also complained of diarrhea, on and off for few months, usually one episode every other week. He takes metformin and colchicine daily. Tophaceous gout: Request uric acid check B12 deficiency: Due for a B12 today.  Review of Systems Denies nausea vomiting.  No blood in the stools no weight loss. No chest pain no difficulty breathing  Past Medical History:  Diagnosis Date  . B12 deficiency    monthly shots  . Diabetes mellitus   . Hyperlipemia   . Hypertension     Past Surgical History:  Procedure Laterality Date  . NO PAST SURGERIES      Social History   Socioeconomic History  . Marital status: Married    Spouse name: Not on file  . Number of children: 0  . Years of education: Not on file  . Highest education level: Not on file  Occupational History  . Occupation: retired from [employer], works for a lab driving  Social Needs  . Financial resource strain: Not on file  . Food insecurity:    Worry: Not on file    Inability: Not on file  . Transportation needs:    Medical: Not on file    Non-medical: Not on file  Tobacco Use  . Smoking status: Never Smoker  . Smokeless tobacco: Never Used  Substance and Sexual Activity  . Alcohol use: No  . Drug use: No  . Sexual activity: Not on file  Lifestyle  . Physical activity:    Days per week: Not on file    Minutes per session: Not on file  . Stress: Not on file  Relationships  . Social connections:    Talks on phone: Not on file    Gets together: Not on file    Attends religious service: Not on file    Active member of club or organization:  Not on file    Attends meetings of clubs or organizations: Not on file    Relationship status: Not on file  . Intimate partner violence:    Fear of current or ex partner: Not on file    Emotionally abused: Not on file    Physically abused: Not on file    Forced sexual activity: Not on file  Other Topics Concern  . Not on file  Social History Narrative   Married (2nd marriage)   Lives w/ wife and 1 dog (german sheppard)       Allergies as of 08/02/2018   No Known Allergies     Medication List        Accurate as of 08/02/18 11:59 PM. Always use your most recent med list.          allopurinol 100 MG tablet Commonly known as:  ZYLOPRIM Take 100 mg by mouth 2 (two) times daily.   aspirin 81 MG tablet Take 81 mg by mouth daily.   atorvastatin 10 MG tablet Commonly known as:  LIPITOR Take 1 tablet (10 mg total) by mouth at bedtime.   CALCIUM + D PO Take 1,000 mg by  mouth daily.   carvedilol 12.5 MG tablet Commonly known as:  COREG Take 1 tablet (12.5 mg total) by mouth 2 (two) times daily with a meal.   cloNIDine 0.2 MG tablet Commonly known as:  CATAPRES Take 1 tablet (0.2 mg total) by mouth 2 (two) times daily.   colchicine 0.6 MG tablet Take 0.6 mg by mouth daily.   Glucosamine-Chondroitin 500-400 MG Caps Take 3 capsules by mouth 2 (two) times daily.   glucose blood test strip Check blood sugar once daily   JANUVIA 100 MG tablet Generic drug:  sitaGLIPtin Take 1 tablet (100 mg total) by mouth daily.   KRILL OIL OMEGA-3 PO Take by mouth.   lisinopril 20 MG tablet Commonly known as:  PRINIVIL,ZESTRIL Take 1 tablet (20 mg total) by mouth 2 (two) times daily.   meloxicam 7.5 MG tablet Commonly known as:  MOBIC Take 1 tablet (7.5 mg total) by mouth daily as needed for pain.   metFORMIN 1000 MG tablet Commonly known as:  GLUCOPHAGE TAKE 1 AND 1/2 TABLETS BY MOUTH EVERY MORNING AND 1 TABLET BY MOUTH EVERY EVENING   multivitamin tablet Take 1 tablet  by mouth daily.   NON FORMULARY Take 1 tablet by mouth daily. [place] Grape Seed   ONE TOUCH LANCETS Misc Check blood sugar once daily   ONETOUCH VERIO FLEX SYSTEM w/Device Kit Check blood sugar once daily           Objective:   Physical Exam BP 126/70 (BP Location: Left Arm, Patient Position: Sitting, Cuff Size: Small)   Pulse (!) 57   Temp 98.2 F (36.8 C) (Oral)   Resp 16   Ht 6' (1.829 m)   Wt 196 lb 6 oz (89.1 kg)   SpO2 98%   BMI 26.63 kg/m  General:   Well developed, NAD, BMI noted.  HEENT:  Normocephalic . Face symmetric, atraumatic Lungs:  CTA B Normal respiratory effort, no intercostal retractions, no accessory muscle use. Heart: RRR, occasional irregular beat, no murmur.  no pretibial edema bilaterally  Abdomen:  Not distended, soft, non-tender. No rebound or rigidity.   Skin: Not pale. Not jaundice Neurologic:  alert & oriented X3.  Speech normal, gait appropriate for age and unassisted Psych--  Cognition and judgment appear intact.  Cooperative with normal attention span and concentration.  Behavior appropriate. No anxious or depressed appearing.     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 +FH CAD  PLAN: DM: Currently on metformin, doing better with diet and exercise, check a A1c. HTN: Last BMP satisfactory, on carvedilol, clonidine, lisinopril.  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 Will get BMP Mg TSH; We will get a echo and a Holter monitor.  Referral to cardiology depending on results. Hyperlipidemia: Well-controlled.   Continue Lipitor. 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  Last CMP normal Anemia: See last office visit, no iron deficiency,  hemoglobin is stable Occasional diarrhea: New issue, no red flag symptoms, on chronic metformin and colchicine daily.  Episode source of diarrhea are once or twice a month, not clear if related to medication.  Recommend observation for now. B12 deficiency: Shot today. Tennis elbow, left: See HPI, DX is tennis elbow, recommend meloxicam as needed, GI precautions discussed.  If not better in the next 2 or 3 weeks, start using tennis elbow brace.  Call if no better. Preventive care: Reissue a Cologuard, it was never completed. RTC 6 months

## 2018-08-02 NOTE — Patient Instructions (Signed)
Please schedule your next medicare wellness visit with me in 1 yr.  Continue to eat heart healthy diet (full of fruits, vegetables, whole grains, lean protein, water--limit salt, fat, and sugar intake) and increase physical activity as tolerated.  Bring a copy of your living will and/or healthcare power of attorney to your next office visit.   [name] , Thank you for taking time to come for your Medicare Wellness Visit. I appreciate your ongoing commitment to your health goals. Please review the following plan we discussed and let me know if I can assist you in the future.   These are the goals we discussed: Goals    . Maintain healthy active lifestyle.       This is a list of the screening recommended for you and due dates:  Health Maintenance  Topic Date Due  . Complete foot exam   06/26/2018  . Hemoglobin A1C  07/28/2018  . Colon Cancer Screening  08/03/2019*  . Tetanus Vaccine  09/08/2018  . Eye exam for diabetics  04/09/2019  . Flu Shot  Completed  .  Hepatitis C: One time screening is recommended by Center for Disease Control  (CDC) for  adults born from [birth year] through 1965.   Completed  . Pneumonia vaccines  Completed  *Topic was postponed. The date shown is not the original due date.    Health Maintenance, Male A healthy lifestyle and preventive care is important for your health and wellness. Ask your health care provider about what schedule of regular examinations is right for you. What should I know about weight and diet? Eat a Healthy Diet  Eat plenty of vegetables, fruits, whole grains, low-fat dairy products, and lean protein.  Do not eat a lot of foods high in solid fats, added sugars, or salt.  Maintain a Healthy Weight Regular exercise can help you achieve or maintain a healthy weight. You should:  Do at least 150 minutes of exercise each week. The exercise should increase your heart rate and make you sweat (moderate-intensity exercise).  Do  strength-training exercises at least twice a week.  Watch Your Levels of Cholesterol and Blood Lipids  Have your blood tested for lipids and cholesterol every 5 years starting at 72 years of age. If you are at high risk for heart disease, you should start having your blood tested when you are 72 years old. You may need to have your cholesterol levels checked more often if: ? Your lipid or cholesterol levels are high. ? You are older than 72 years of age. ? You are at high risk for heart disease.  What should I know about cancer screening? Many types of cancers can be detected early and may often be prevented. Lung Cancer  You should be screened every year for lung cancer if: ? You are a current smoker who has smoked for at least 30 years. ? You are a former smoker who has quit within the past 15 years.  Talk to your health care provider about your screening options, when you should start screening, and how often you should be screened.  Colorectal Cancer  Routine colorectal cancer screening usually begins at 72 years of age and should be repeated every 5-10 years until you are 72 years old. You may need to be screened more often if early forms of precancerous polyps or small growths are found. Your health care provider may recommend screening at an earlier age if you have risk factors for colon cancer.  Your  health care provider may recommend using home test kits to check for hidden blood in the stool.  A small camera at the end of a tube can be used to examine your colon (sigmoidoscopy or colonoscopy). This checks for the earliest forms of colorectal cancer.  Prostate and Testicular Cancer  Depending on your age and overall health, your health care provider may do certain tests to screen for prostate and testicular cancer.  Talk to your health care provider about any symptoms or concerns you have about testicular or prostate cancer.  Skin Cancer  Check your skin from head to toe  regularly.  Tell your health care provider about any new moles or changes in moles, especially if: ? There is a change in a mole's size, shape, or color. ? You have a mole that is larger than a pencil eraser.  Always use sunscreen. Apply sunscreen liberally and repeat throughout the day.  Protect yourself by wearing long sleeves, pants, a wide-brimmed hat, and sunglasses when outside.  What should I know about heart disease, diabetes, and high blood pressure?  If you are 35-87 years of age, have your blood pressure checked every 3-5 years. If you are 50 years of age or older, have your blood pressure checked every year. You should have your blood pressure measured twice-once when you are at a hospital or clinic, and once when you are not at a hospital or clinic. Record the average of the two measurements. To check your blood pressure when you are not at a hospital or clinic, you can use: ? An automated blood pressure machine at a pharmacy. ? A home blood pressure monitor.  Talk to your health care provider about your target blood pressure.  If you are between 81-26 years old, ask your health care provider if you should take aspirin to prevent heart disease.  Have regular diabetes screenings by checking your fasting blood sugar level. ? If you are at a normal weight and have a low risk for diabetes, have this test once every three years after the age of 13. ? If you are overweight and have a high risk for diabetes, consider being tested at a younger age or more often.  A one-time screening for abdominal aortic aneurysm (AAA) by ultrasound is recommended for men aged 40-75 years who are current or former smokers. What should I know about preventing infection? Hepatitis B If you have a higher risk for hepatitis B, you should be screened for this virus. Talk with your health care provider to find out if you are at risk for hepatitis B infection. Hepatitis C Blood testing is recommended  for:  Everyone born from [birth year] through 1965.  Anyone with known risk factors for hepatitis C.  Sexually Transmitted Diseases (STDs)  You should be screened each year for STDs including gonorrhea and chlamydia if: ? You are sexually active and are younger than 72 years of age. ? You are older than 72 years of age and your health care provider tells you that you are at risk for this type of infection. ? Your sexual activity has changed since you were last screened and you are at an increased risk for chlamydia or gonorrhea. Ask your health care provider if you are at risk.  Talk with your health care provider about whether you are at high risk of being infected with HIV. Your health care provider may recommend a prescription medicine to help prevent HIV infection.  What else can I do?  Schedule regular health, dental, and eye exams.  Stay current with your vaccines (immunizations).  Do not use any tobacco products, such as cigarettes, chewing tobacco, and e-cigarettes. If you need help quitting, ask your health care provider.  Limit alcohol intake to no more than 2 drinks per day. One drink equals 12 ounces of beer, 5 ounces of wine, or 1 ounces of hard liquor.  Do not use street drugs.  Do not share needles.  Ask your health care provider for help if you need support or information about quitting drugs.  Tell your health care provider if you often feel depressed.  Tell your health care provider if you have ever been abused or do not feel safe at home. This information is not intended to replace advice given to you by your health care provider. Make sure you discuss any questions you have with your health care provider. Document Released: 02/21/2008 Document Revised: 04/23/2016 Document Reviewed: 05/29/2015 Elsevier Interactive Patient Education  [employer].

## 2018-08-03 ENCOUNTER — Other Ambulatory Visit: Payer: Self-pay | Admitting: Internal Medicine

## 2018-08-03 LAB — BASIC METABOLIC PANEL
BUN: 17 mg/dL (ref 6–23)
CALCIUM: 9.4 mg/dL (ref 8.4–10.5)
CO2: 26 meq/L (ref 19–32)
Chloride: 101 mEq/L (ref 96–112)
Creatinine, Ser: 1.19 mg/dL (ref 0.40–1.50)
GFR: 63.83 mL/min (ref >60.00)
GLUCOSE: 115 mg/dL — AB (ref 70–99)
Potassium: 4.6 mEq/L (ref 3.5–5.1)
Sodium: 136 mEq/L (ref 135–145)

## 2018-08-03 LAB — TSH: TSH: 1.31 u[IU]/mL (ref 0.35–4.50)

## 2018-08-03 LAB — HEMOGLOBIN A1C: Hgb A1c MFr Bld: 7 % — ABNORMAL HIGH (ref 4.6–6.5)

## 2018-08-03 LAB — URIC ACID: URIC ACID, SERUM: 6 mg/dL (ref 4.0–7.8)

## 2018-08-03 LAB — MAGNESIUM: Magnesium: 1 mg/dL — CL (ref 1.5–2.5)

## 2018-08-03 MED ORDER — MAGNESIUM OXIDE 400 MG PO CAPS: 400.0000 mg | ORAL_CAPSULE | Freq: Every day | ORAL | 1 refills | Status: DC

## 2018-08-03 NOTE — Assessment & Plan Note (Signed)
DM: Currently on metformin, doing better with diet and exercise, check a A1c. HTN: Last BMP satisfactory, on carvedilol, clonidine, lisinopril.  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 Will get BMP Mg TSH; We will get a echo and a Holter monitor.  Referral to cardiology depending on results. Hyperlipidemia: Well-controlled.   Continue Lipitor. 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  Last CMP normal Anemia: See last office visit, no iron deficiency, hemoglobin is stable Occasional diarrhea: New issue, no red flag symptoms, on chronic metformin and colchicine daily.  Episode source of diarrhea are once or twice a month, not clear if related to medication.  Recommend observation for now. B12 deficiency: Shot today. Tennis elbow, left: See HPI, DX is tennis elbow, recommend meloxicam as needed, GI precautions discussed.  If not better in the next 2 or 3 weeks, start using tennis elbow brace.  Call if no better. Preventive care: Reissue a Cologuard, it was never completed. RTC 6 months

## 2018-08-16 ENCOUNTER — Ambulatory Visit (HOSPITAL_COMMUNITY): Payer: Medicare Other | Attending: Cardiology

## 2018-08-16 DIAGNOSIS — R008 Other abnormalities of heart beat: Secondary | ICD-10-CM | POA: Insufficient documentation

## 2018-08-17 ENCOUNTER — Other Ambulatory Visit (INDEPENDENT_AMBULATORY_CARE_PROVIDER_SITE_OTHER): Payer: Medicare Other

## 2018-08-17 DIAGNOSIS — R008 Other abnormalities of heart beat: Secondary | ICD-10-CM

## 2018-08-17 DIAGNOSIS — R790 Abnormal level of blood mineral: Secondary | ICD-10-CM | POA: Diagnosis not present

## 2018-08-17 LAB — MAGNESIUM: Magnesium: 1.1 mg/dL — ABNORMAL LOW (ref 1.5–2.5)

## 2018-08-18 MED ORDER — MAGNESIUM OXIDE 400 MG PO CAPS: 800.0000 mg | ORAL_CAPSULE | Freq: Two times a day (BID) | ORAL | 0 refills | Status: DC

## 2018-08-18 NOTE — Addendum Note (Signed)
Addended byDamita Dunnings D on: 08/18/2018 05:01 PM   Modules accepted: Orders

## 2018-08-19 NOTE — Addendum Note (Signed)
Addended byDamita Dunnings D on: 08/19/2018 07:48 AM   Modules accepted: Orders

## 2018-08-23 ENCOUNTER — Other Ambulatory Visit (INDEPENDENT_AMBULATORY_CARE_PROVIDER_SITE_OTHER): Payer: Medicare Other

## 2018-08-23 ENCOUNTER — Ambulatory Visit (INDEPENDENT_AMBULATORY_CARE_PROVIDER_SITE_OTHER): Payer: Medicare Other

## 2018-08-23 DIAGNOSIS — R008 Other abnormalities of heart beat: Secondary | ICD-10-CM

## 2018-08-23 DIAGNOSIS — R790 Abnormal level of blood mineral: Secondary | ICD-10-CM

## 2018-08-23 LAB — MAGNESIUM: MAGNESIUM: 1.5 mg/dL (ref 1.5–2.5)

## 2018-08-24 ENCOUNTER — Telehealth: Payer: Self-pay | Admitting: Internal Medicine

## 2018-08-24 NOTE — Telephone Encounter (Signed)
Orders placed.

## 2018-08-24 NOTE — Telephone Encounter (Signed)
Magnesium is now resolved, etiology unclear, the patient does not take diuretics or PPIs.  No hypercalcemia, diabetes is controlled. Also, his EKG showed no longer trigeminy. He will continue with magnesian oxide 400 mg 2 tablets twice daily (OTC)  Please enter order for BMP and magnesium levels, he is coming for a B12 shot 09/03/2018 and we can do that that day.  DX hypomagnesemia. I spoke with the patient today and he is in agreement

## 2018-09-03 ENCOUNTER — Other Ambulatory Visit (INDEPENDENT_AMBULATORY_CARE_PROVIDER_SITE_OTHER): Payer: Medicare Other

## 2018-09-03 ENCOUNTER — Ambulatory Visit (INDEPENDENT_AMBULATORY_CARE_PROVIDER_SITE_OTHER): Payer: Medicare Other

## 2018-09-03 DIAGNOSIS — E538 Deficiency of other specified B group vitamins: Secondary | ICD-10-CM | POA: Diagnosis not present

## 2018-09-03 MED ORDER — CYANOCOBALAMIN 1000 MCG/ML IJ SOLN
1000.0000 ug | Freq: Once | INTRAMUSCULAR | Status: AC
  Administered 2018-09-03: 1000 ug via INTRAMUSCULAR

## 2018-09-03 NOTE — Progress Notes (Addendum)
Pt. Here for monthly B12 injection per Dr. Larose Kells.  B12 1039mcg given in L deltoid IM; pt. Tolerated well.   Next appointment made for 10/08/18.   Kathlene November, MD

## 2018-09-04 LAB — BASIC METABOLIC PANEL
BUN: 11 mg/dL (ref 7–25)
CO2: 28 mmol/L (ref 20–32)
CREATININE: 0.96 mg/dL (ref 0.70–1.18)
Calcium: 9.2 mg/dL (ref 8.6–10.3)
Chloride: 99 mmol/L (ref 98–110)
Glucose, Bld: 179 mg/dL — ABNORMAL HIGH (ref 65–99)
Potassium: 4.1 mmol/L (ref 3.5–5.3)
Sodium: 136 mmol/L (ref 135–146)

## 2018-09-04 LAB — MAGNESIUM: Magnesium: 1.3 mg/dL — ABNORMAL LOW (ref 1.5–2.5)

## 2018-09-23 ENCOUNTER — Telehealth: Payer: Self-pay | Admitting: Internal Medicine

## 2018-09-23 NOTE — Telephone Encounter (Addendum)
Spoke with nephrology, Dr. Lorrene Reid , appreciate her help. Typical causes for hypomagnesemia EtOH, chronic diarrhea and PPIs. I spoke with the patient, he feels great, still taking magnesium 5 tablets daily. Apparently has some diarrhea. We agreed on a office visit next week for blood work and further discussion.

## 2018-09-28 ENCOUNTER — Ambulatory Visit (INDEPENDENT_AMBULATORY_CARE_PROVIDER_SITE_OTHER): Payer: Medicare Other | Admitting: Internal Medicine

## 2018-09-28 ENCOUNTER — Encounter: Payer: Self-pay | Admitting: Internal Medicine

## 2018-09-28 DIAGNOSIS — R008 Other abnormalities of heart beat: Secondary | ICD-10-CM | POA: Diagnosis not present

## 2018-09-28 DIAGNOSIS — D649 Anemia, unspecified: Secondary | ICD-10-CM

## 2018-09-28 DIAGNOSIS — K529 Noninfective gastroenteritis and colitis, unspecified: Secondary | ICD-10-CM | POA: Diagnosis not present

## 2018-09-28 NOTE — Progress Notes (Signed)
Pre visit review using our clinic review tool, if applicable. No additional management support is needed unless otherwise documented below in the visit note. 

## 2018-09-28 NOTE — Patient Instructions (Signed)
GO TO THE LAB : Get the blood work     GO TO THE FRONT DESK Schedule your next appointment   a checkup in 2 months

## 2018-09-28 NOTE — Progress Notes (Signed)
Subjective:    Patient ID: Alex Church, male    DOB: [birth year], 73 y.o.   MRN: [id number]  DOS:  09/28/2018 Type of visit - description: Follow-up Since the last visit, he was found to have low magnesium, currently on [po supplements, taking 5 tablets daily. On further questioning, he has developed diarrhea.  Describes the stools are loose not watery, 1 large BM daily. When looking back, the newest medications that he is taking is allopurinol and colchicine for the last approximately 1 year. He takes 1 colchicine every day even if he has no acute symptoms of gout.  Review of Systems  Denies weight loss. No chest pain, difficulty breathing or palpitations No nausea, vomiting, blood in the stools or abdominal pain.  Past Medical History:  Diagnosis Date  . B12 deficiency    monthly shots  . Diabetes mellitus   . Hyperlipemia   . Hypertension     Past Surgical History:  Procedure Laterality Date  . NO PAST SURGERIES      Social History   Socioeconomic History  . Marital status: Married    Spouse name: Not on file  . Number of children: 0  . Years of education: Not on file  . Highest education level: Not on file  Occupational History  . Occupation: retired from [employer], works for a lab driving  Social Needs  . Financial resource strain: Not on file  . Food insecurity:    Worry: Not on file    Inability: Not on file  . Transportation needs:    Medical: Not on file    Non-medical: Not on file  Tobacco Use  . Smoking status: Never Smoker  . Smokeless tobacco: Never Used  Substance and Sexual Activity  . Alcohol use: No  . Drug use: No  . Sexual activity: Not on file  Lifestyle  . Physical activity:    Days per week: Not on file    Minutes per session: Not on file  . Stress: Not on file  Relationships  . Social connections:    Talks on phone: Not on file    Gets together: Not on file    Attends religious service: Not on file    Active member of club or  organization: Not on file    Attends meetings of clubs or organizations: Not on file    Relationship status: Not on file  . Intimate partner violence:    Fear of current or ex partner: Not on file    Emotionally abused: Not on file    Physically abused: Not on file    Forced sexual activity: Not on file  Other Topics Concern  . Not on file  Social History Narrative   Married (2nd marriage)   Lives w/ wife and 1 dog (german sheppard)       Allergies as of 09/28/2018   No Known Allergies     Medication List       Accurate as of September 28, 2018 11:59 PM. Always use your most recent med list.        allopurinol 100 MG tablet Commonly known as:  ZYLOPRIM Take 100 mg by mouth 2 (two) times daily.   aspirin 81 MG tablet Take 81 mg by mouth daily.   atorvastatin 10 MG tablet Commonly known as:  LIPITOR Take 1 tablet (10 mg total) by mouth at bedtime.   CALCIUM + D PO Take 1,000 mg by mouth daily.   carvedilol 12.5 MG  tablet Commonly known as:  COREG Take 1 tablet (12.5 mg total) by mouth 2 (two) times daily with a meal.   cloNIDine 0.2 MG tablet Commonly known as:  CATAPRES Take 1 tablet (0.2 mg total) by mouth 2 (two) times daily.   colchicine 0.6 MG tablet Take 0.6 mg by mouth daily.   Glucosamine-Chondroitin 500-400 MG Caps Take 3 capsules by mouth 2 (two) times daily.   glucose blood test strip Commonly known as:  ONE TOUCH ULTRA TEST Check blood sugar once daily   JANUVIA 100 MG tablet Generic drug:  sitaGLIPtin Take 1 tablet (100 mg total) by mouth daily.   KRILL OIL OMEGA-3 PO Take by mouth.   lisinopril 20 MG tablet Commonly known as:  PRINIVIL,ZESTRIL Take 1 tablet (20 mg total) by mouth 2 (two) times daily.   Magnesium Oxide 400 MG Caps Take 2 capsules (800 mg total) by mouth 2 (two) times daily.   meloxicam 7.5 MG tablet Commonly known as:  MOBIC Take 1 tablet (7.5 mg total) by mouth daily as needed for pain.   metFORMIN 1000 MG  tablet Commonly known as:  GLUCOPHAGE TAKE 1 AND 1/2 TABLETS BY MOUTH EVERY MORNING AND 1 TABLET BY MOUTH EVERY EVENING   multivitamin tablet Take 1 tablet by mouth daily.   NON FORMULARY Take 1 tablet by mouth daily. [place] Grape Seed   ONE TOUCH LANCETS Misc Check blood sugar once daily   ONETOUCH VERIO FLEX SYSTEM w/Device Kit Check blood sugar once daily           Objective:   Physical Exam BP 126/70 (BP Location: Left Arm, Patient Position: Sitting, Cuff Size: Small)   Pulse 67   Temp 97.8 F (36.6 C) (Oral)   Resp 16   Ht 6' (1.829 m)   Wt 201 lb 2 oz (91.2 kg)   SpO2 98%   BMI 27.28 kg/m  General:   Well developed, NAD, BMI noted. HEENT:  Normocephalic . Face symmetric, atraumatic Lungs:  CTA B Normal respiratory effort, no intercostal retractions, no accessory muscle use. Heart: RRR,  no murmur.  No pretibial edema bilaterally  Skin: Not pale. Not jaundice Neurologic:  alert & oriented X3.  Speech normal, gait appropriate for age and unassisted Psych--  Cognition and judgment appear intact.  Cooperative with normal attention span and concentration.  Behavior appropriate. No anxious or depressed appearing.      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  Magnesium was replenished and EKG returned to normal.  Echocardiogram was essentially normal.  I did not pursue Holter as I believe problem was related to low magnesium. Hypomagnesemia: Magnesium has been low, last level was 1.3 despite the fact that pt was taking 4-5 magnesium tablets daily.  I informally discussed the issue with nephrology, commom etiologies for low Mg:  EtOH, PPIs and diarrhea.  Today the patient reports  diarrhea as described below. Plan: BMP, magnesium level, continue with magnesium 5 tablets daily.  We are stopping colchicine which is the most likely culprit of diarrhea, see next.  Reassess in 2 months. Diarrhea:  started few months ago, newest meds are colchicine and allopurinol for almost a year, on metformin for many years.  No recent abx. . Recommend to stop colchicine.  Continue allopurinol. Mild anemia: Recheck a CBC RTC 2 months

## 2018-09-29 LAB — BASIC METABOLIC PANEL
BUN: 17 mg/dL (ref 6–23)
CO2: 30 mEq/L (ref 19–32)
Calcium: 10 mg/dL (ref 8.4–10.5)
Chloride: 97 mEq/L (ref 96–112)
Creatinine, Ser: 1.19 mg/dL (ref 0.40–1.50)
GFR: 60.03 mL/min (ref >60.00)
Glucose, Bld: 118 mg/dL — ABNORMAL HIGH (ref 70–99)
Potassium: 4.4 mEq/L (ref 3.5–5.1)
Sodium: 134 mEq/L — ABNORMAL LOW (ref 135–145)

## 2018-09-29 LAB — CBC WITH DIFFERENTIAL/PLATELET
Basophils Absolute: 0.1 10*3/uL (ref 0.0–0.1)
Basophils Relative: 1.7 % (ref 0.0–3.0)
Eosinophils Absolute: 0.3 10*3/uL (ref 0.0–0.7)
Eosinophils Relative: 3.8 % (ref 0.0–5.0)
HCT: 39.4 % (ref 39.0–52.0)
Hemoglobin: 13.3 g/dL (ref 13.0–17.0)
LYMPHS PCT: 23.5 % (ref 12.0–46.0)
Lymphs Abs: 2 10*3/uL (ref 0.7–4.0)
MCHC: 33.7 g/dL (ref 30.0–36.0)
MCV: 91.4 fl (ref 78.0–100.0)
Monocytes Absolute: 0.7 10*3/uL (ref 0.1–1.0)
Monocytes Relative: 8.6 % (ref 3.0–12.0)
Neutro Abs: 5.2 10*3/uL (ref 1.4–7.7)
Neutrophils Relative %: 62.4 % (ref 43.0–77.0)
Platelets: 202 10*3/uL (ref 150.0–400.0)
RBC: 4.31 Mil/uL (ref 4.22–5.81)
RDW: 14.7 % (ref 11.5–15.5)
WBC: 8.3 10*3/uL (ref 4.0–10.5)

## 2018-09-29 LAB — MAGNESIUM: Magnesium: 1.8 mg/dL (ref 1.5–2.5)

## 2018-09-29 NOTE — Assessment & Plan Note (Addendum)
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  Magnesium was replenished and EKG returned to normal.  Echocardiogram was essentially normal.  I did not pursue Holter as I believe problem was related to low magnesium. Hypomagnesemia: Magnesium has been low, last level was 1.3 despite the fact that pt was taking 4-5 magnesium tablets daily.  I informally discussed the issue with nephrology, commom etiologies for low Mg:  EtOH, PPIs and diarrhea.  Today the patient reports diarrhea as described below. Plan: BMP, magnesium level, continue with magnesium 5 tablets daily.  We are stopping colchicine which is the most likely culprit of diarrhea, see next.  Reassess in 2 months. Diarrhea:  started few months ago, newest meds are colchicine and allopurinol for almost a year, on metformin for many years.  No recent abx. . Recommend to stop colchicine.  Continue allopurinol. Mild anemia: Recheck a CBC RTC 2 months

## 2018-10-01 NOTE — Addendum Note (Signed)
Addended byDamita Dunnings D on: 10/01/2018 04:22 PM   Modules accepted: Orders

## 2018-10-06 IMAGING — DX DG FOOT COMPLETE 3+V*L*
3 series · 3 of 3 positions shown · non-contrast
Comparison: No recent prior

CLINICAL DATA: Knots on foot.  Pain.

EXAM:
LEFT FOOT - COMPLETE 3+ VIEW

[foot ap]
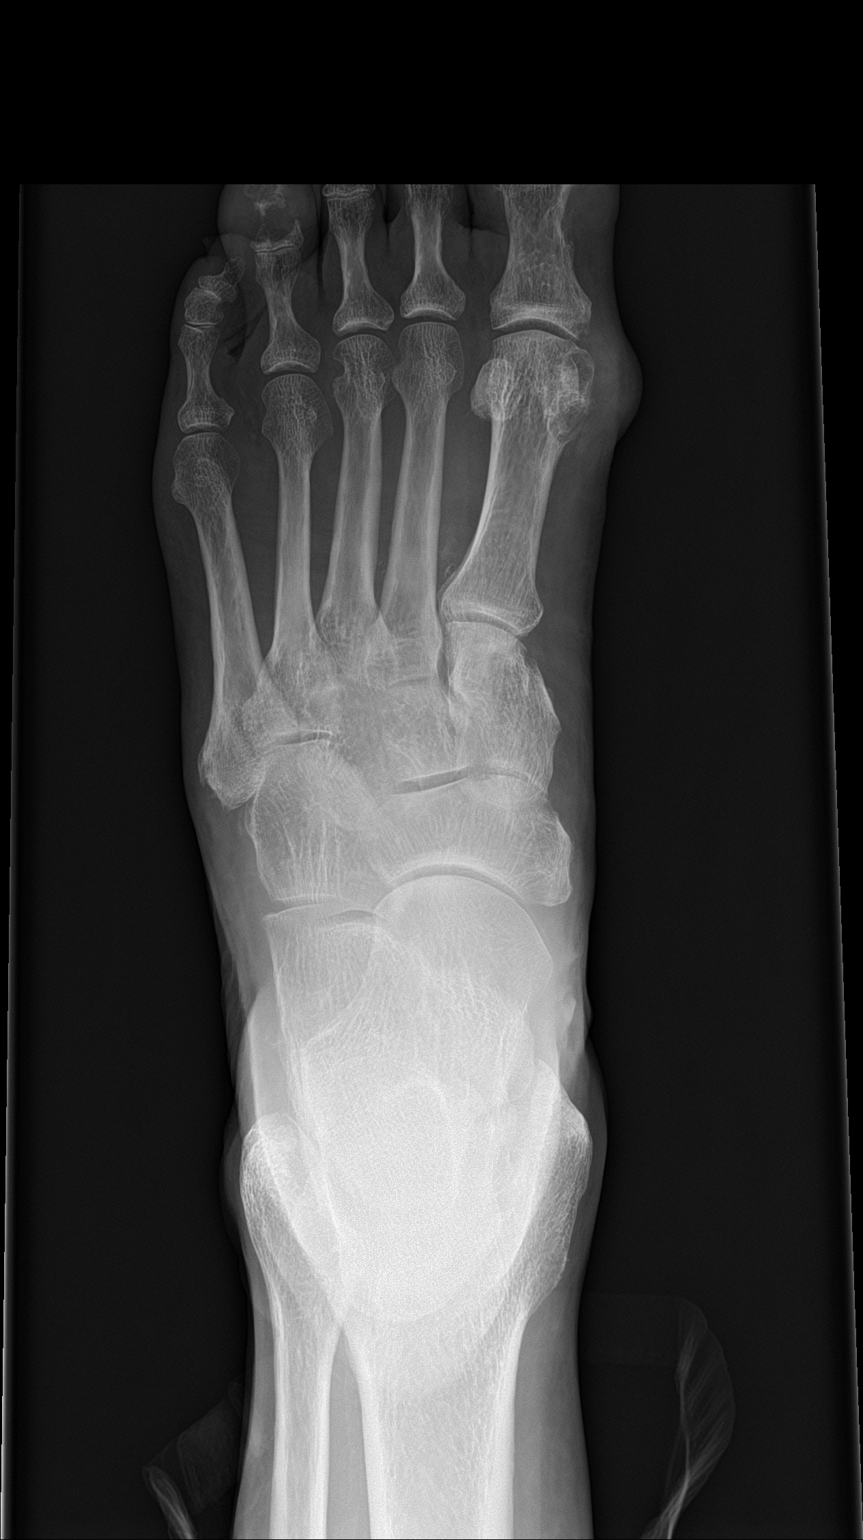

[foot obl]
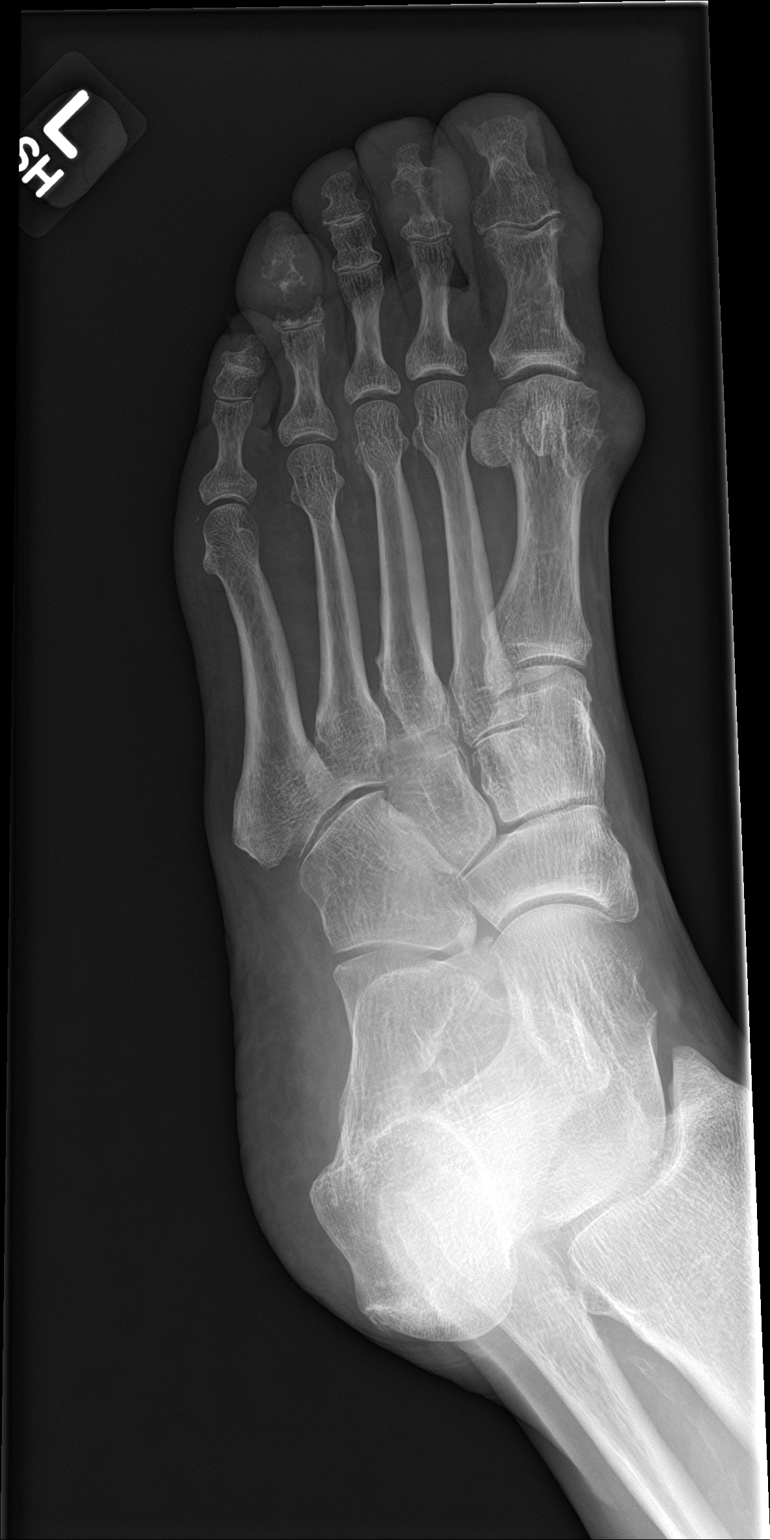

[foot lat]
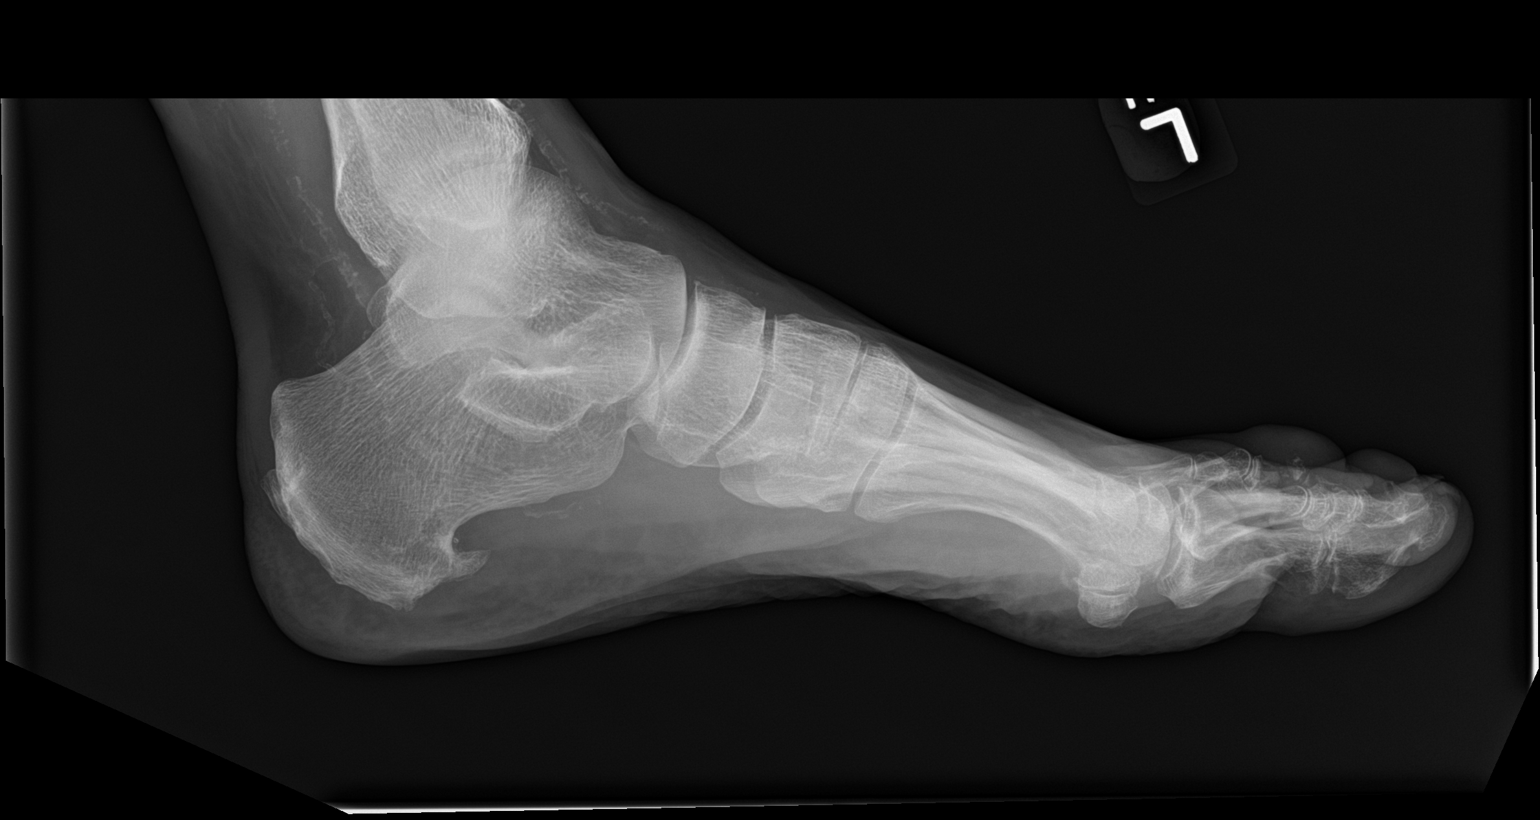

[3 of 3 positions shown; findings below may reference images not displayed]

FINDINGS: Diffuse degenerative change. Degenerative changes most prominent
first metatarsophalangeal joint. Soft tissue swelling noted adjacent
to the first metatarsophalangeal joint. Mild erosive changes noted
in the adjacent bony structures. Process such as gout could present
this fashion. Changes may just be secondary to a bunion.
IMPRESSION: 1. Soft-tissue swelling with adjacent mild bony erosive changes left
first metatarsophalangeal joint. Gout or other [HOSPITAL] induced
arthropathy or inflammatory arthropathy cannot be excluded. Changes
may just represent a bunion.

2. Diffuse degenerative change. Degenerative changes are most
prominent first metatarsophalangeal joint .

## 2018-10-08 ENCOUNTER — Ambulatory Visit: Payer: Medicare Other

## 2018-10-14 DIAGNOSIS — H40013 Open angle with borderline findings, low risk, bilateral: Secondary | ICD-10-CM | POA: Diagnosis not present

## 2018-10-21 DIAGNOSIS — Z6827 Body mass index (BMI) 27.0-27.9, adult: Secondary | ICD-10-CM | POA: Diagnosis not present

## 2018-10-21 DIAGNOSIS — E663 Overweight: Secondary | ICD-10-CM | POA: Diagnosis not present

## 2018-10-21 DIAGNOSIS — M1A09X1 Idiopathic chronic gout, multiple sites, with tophus (tophi): Secondary | ICD-10-CM | POA: Diagnosis not present

## 2018-10-25 ENCOUNTER — Other Ambulatory Visit: Payer: Self-pay | Admitting: Internal Medicine

## 2018-11-05 ENCOUNTER — Other Ambulatory Visit (INDEPENDENT_AMBULATORY_CARE_PROVIDER_SITE_OTHER): Payer: Medicare Other

## 2018-11-05 LAB — BASIC METABOLIC PANEL
BUN: 13 mg/dL (ref 6–23)
CALCIUM: 9.5 mg/dL (ref 8.4–10.5)
CO2: 29 mEq/L (ref 19–32)
Chloride: 97 mEq/L (ref 96–112)
Creatinine, Ser: 1.13 mg/dL (ref 0.40–1.50)
GFR: 63.7 mL/min (ref >60.00)
Glucose, Bld: 162 mg/dL — ABNORMAL HIGH (ref 70–99)
Potassium: 4.4 mEq/L (ref 3.5–5.1)
Sodium: 135 mEq/L (ref 135–145)

## 2018-11-05 LAB — MAGNESIUM: MAGNESIUM: 1.6 mg/dL (ref 1.5–2.5)

## 2018-11-23 ENCOUNTER — Other Ambulatory Visit: Payer: Self-pay | Admitting: Internal Medicine

## 2018-11-25 DIAGNOSIS — M1A09X1 Idiopathic chronic gout, multiple sites, with tophus (tophi): Secondary | ICD-10-CM | POA: Diagnosis not present

## 2018-11-26 ENCOUNTER — Ambulatory Visit: Payer: Medicare Other | Admitting: Internal Medicine

## 2018-12-31 ENCOUNTER — Ambulatory Visit (INDEPENDENT_AMBULATORY_CARE_PROVIDER_SITE_OTHER): Payer: Medicare Other | Admitting: Internal Medicine

## 2018-12-31 ENCOUNTER — Other Ambulatory Visit: Payer: Self-pay

## 2018-12-31 DIAGNOSIS — E782 Mixed hyperlipidemia: Secondary | ICD-10-CM

## 2018-12-31 DIAGNOSIS — E1169 Type 2 diabetes mellitus with other specified complication: Secondary | ICD-10-CM | POA: Diagnosis not present

## 2018-12-31 DIAGNOSIS — R7989 Other specified abnormal findings of blood chemistry: Secondary | ICD-10-CM

## 2018-12-31 DIAGNOSIS — I10 Essential (primary) hypertension: Secondary | ICD-10-CM

## 2018-12-31 NOTE — Progress Notes (Signed)
Subjective:    Patient ID: Alex Church, male    DOB: 02/04/1946, 73 y.o.   MRN: [id number]  DOS:  12/31/2018 Type of visit - description: Virtual Visit via Video Note  I connected with@ on 01/03/19 at  3:00 PM EDT by a video enabled telemedicine application and verified that I am speaking with the correct person using two identifiers.   THIS ENCOUNTER IS A VIRTUAL VISIT DUE TO COVID-19 - PATIENT WAS NOT SEEN IN THE OFFICE. PATIENT HAS CONSENTED TO VIRTUAL VISIT / TELEMEDICINE VISIT   Location of patient: home  Location of provider: office  I discussed the limitations of evaluation and management by telemedicine and the availability of in person appointments. The patient expressed understanding and agreed to proceed.  History of Present Illness: Follow-up Diarrhea: Since the last office visit he stopped colchicine, diarrhea decreased significantly, essentially stopped; however 2 weeks later he had a gout episode and now is taking colchicine every other day.  Diarrhea is now mild and sporadic. Low magnesium: Good compliance with supplements DM: Due for labs HTN: Good med compliance.  Ambulatory BPs normal COVID-19: Still working, his job is to pick up samples from doctors offices, reports has very good protective equipment.    Review of Systems No fever chills No nausea, vomiting. No chest pain, difficulty breathing or palpitations  Past Medical History:  Diagnosis Date  . B12 deficiency    monthly shots  . Diabetes mellitus   . Hyperlipemia   . Hypertension     Past Surgical History:  Procedure Laterality Date  . NO PAST SURGERIES      Social History   Socioeconomic History  . Marital status: Married    Spouse name: Not on file  . Number of children: 0  . Years of education: Not on file  . Highest education level: Not on file  Occupational History  . Occupation: retired from [employer], works for a lab driving  Social Needs  . Financial resource strain: Not on  file  . Food insecurity:    Worry: Not on file    Inability: Not on file  . Transportation needs:    Medical: Not on file    Non-medical: Not on file  Tobacco Use  . Smoking status: Never Smoker  . Smokeless tobacco: Never Used  Substance and Sexual Activity  . Alcohol use: No  . Drug use: No  . Sexual activity: Not on file  Lifestyle  . Physical activity:    Days per week: Not on file    Minutes per session: Not on file  . Stress: Not on file  Relationships  . Social connections:    Talks on phone: Not on file    Gets together: Not on file    Attends religious service: Not on file    Active member of club or organization: Not on file    Attends meetings of clubs or organizations: Not on file    Relationship status: Not on file  . Intimate partner violence:    Fear of current or ex partner: Not on file    Emotionally abused: Not on file    Physically abused: Not on file    Forced sexual activity: Not on file  Other Topics Concern  . Not on file  Social History Narrative   Married (2nd marriage)   Lives w/ wife and 1 dog (german sheppard)       Allergies as of 12/31/2018   No Known Allergies  Medication List       Accurate as of December 31, 2018 11:59 PM. Always use your most recent med list.        allopurinol 100 MG tablet Commonly known as:  ZYLOPRIM Take 100 mg by mouth 2 (two) times daily.   aspirin 81 MG tablet Take 81 mg by mouth daily.   atorvastatin 10 MG tablet Commonly known as:  LIPITOR Take 1 tablet (10 mg total) by mouth at bedtime.   CALCIUM + D PO Take 1,000 mg by mouth daily.   carvedilol 12.5 MG tablet Commonly known as:  COREG Take 1 tablet (12.5 mg total) by mouth 2 (two) times daily with a meal.   cloNIDine 0.2 MG tablet Commonly known as:  CATAPRES Take 1 tablet (0.2 mg total) by mouth 2 (two) times daily.   colchicine 0.6 MG tablet Take 0.6 mg by mouth daily.   Fish Oil 1000 MG Caps Take by mouth.    Glucosamine-Chondroitin 500-400 MG Caps Take 3 capsules by mouth 2 (two) times daily.   glucose blood test strip Commonly known as:  ONE TOUCH ULTRA TEST Check blood sugar once daily   Januvia 100 MG tablet Generic drug:  sitaGLIPtin Take 1 tablet (100 mg total) by mouth daily.   lisinopril 20 MG tablet Commonly known as:  ZESTRIL Take 1 tablet (20 mg total) by mouth 2 (two) times daily.   Magnesium Oxide 400 MG Caps Take 2 capsules (800 mg total) by mouth 2 (two) times daily.   meloxicam 7.5 MG tablet Commonly known as:  MOBIC Take 1 tablet (7.5 mg total) by mouth daily as needed for pain.   metFORMIN 1000 MG tablet Commonly known as:  GLUCOPHAGE TAKE 1 AND 1/2 TABLETS BY MOUTH EVERY MORNING AND 1 TABLET BY MOUTH EVERY EVENING   multivitamin tablet Take 1 tablet by mouth daily.   NON FORMULARY Take 1 tablet by mouth daily. [place] Grape Seed   ONE TOUCH LANCETS Misc Check blood sugar once daily   OneTouch Verio Flex System w/Device Kit Check blood sugar once daily           Objective:   Physical Exam There were no vitals taken for this visit. 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  Currently asymptomatic.  This is a virtual visit, will do EKG at the next opportunity HTN: Reports good ambulatory BPs, currently on carvedilol, clonidine, lisinopril, check a CMP DM: Currently on Januvia, metformin.  Last A1c 7.0, recheck High cholesterol: On Lipitor, due for labs, check a FLP Hypomagnesemia see previous entries, felt to be due to diarrhea, he held colchicine and diarrhea essentially resolved but had to go back on it every other day due to gout.   At this point diarrhea is minimal. We will check a magnesium level, currently on 4 tablets of magnesium a day.  Further advised with results Diarrhea: As above, likely due to colchicine. COVID-19: Fortunately, he has been asymptomatic and following all the precautions Plan: Fasting labs next week RTC 4 to 6 months, face-to-face.   I discussed the assessment and treatment plan with the patient. The patient was provided an opportunity to ask questions and  all were answered. The patient agreed with the plan and demonstrated an understanding of the instructions.   The patient was advised to call back or seek an in-person evaluation if the symptoms worsen or if the condition fails to improve as anticipated.

## 2019-01-03 NOTE — Assessment & Plan Note (Signed)
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  Currently asymptomatic.  This is a virtual visit, will do EKG at the next opportunity HTN: Reports good ambulatory BPs, currently on carvedilol, clonidine, lisinopril, check a CMP DM: Currently on Januvia, metformin.  Last A1c 7.0, recheck High cholesterol: On Lipitor, due for labs, check a FLP Hypomagnesemia see previous entries, felt to be due to diarrhea, he held colchicine and diarrhea essentially resolved but had to go back on it every other day due to gout.  At this point diarrhea is minimal. We will check a magnesium level, currently on 4 tablets of magnesium a day.  Further advised with results Diarrhea: As above, likely due to colchicine. COVID-19: Fortunately, he has been asymptomatic and following all the precautions Plan: Fasting labs next week RTC 4 to 6 months, face-to-face.

## 2019-01-07 ENCOUNTER — Other Ambulatory Visit: Payer: Self-pay

## 2019-01-07 ENCOUNTER — Other Ambulatory Visit (INDEPENDENT_AMBULATORY_CARE_PROVIDER_SITE_OTHER): Payer: Medicare Other

## 2019-01-07 DIAGNOSIS — I10 Essential (primary) hypertension: Secondary | ICD-10-CM | POA: Diagnosis not present

## 2019-01-07 DIAGNOSIS — R7989 Other specified abnormal findings of blood chemistry: Secondary | ICD-10-CM

## 2019-01-07 DIAGNOSIS — E782 Mixed hyperlipidemia: Secondary | ICD-10-CM | POA: Diagnosis not present

## 2019-01-07 DIAGNOSIS — E1169 Type 2 diabetes mellitus with other specified complication: Secondary | ICD-10-CM

## 2019-01-07 LAB — COMPREHENSIVE METABOLIC PANEL
ALT: 21 U/L (ref 0–53)
AST: 22 U/L (ref 0–37)
Albumin: 4.2 g/dL (ref 3.5–5.2)
Alkaline Phosphatase: 83 U/L (ref 39–117)
BUN: 12 mg/dL (ref 6–23)
CO2: 30 mEq/L (ref 19–32)
Calcium: 9.7 mg/dL (ref 8.4–10.5)
Chloride: 98 mEq/L (ref 96–112)
Creatinine, Ser: 1.17 mg/dL (ref 0.40–1.50)
GFR: 61.17 mL/min (ref >60.00)
Glucose, Bld: 135 mg/dL — ABNORMAL HIGH (ref 70–99)
Potassium: 4.5 mEq/L (ref 3.5–5.1)
Sodium: 136 mEq/L (ref 135–145)
Total Bilirubin: 0.5 mg/dL (ref 0.2–1.2)
Total Protein: 7 g/dL (ref 6.0–8.3)

## 2019-01-07 LAB — LIPID PANEL
Cholesterol: 110 mg/dL (ref 0–200)
HDL: 37 mg/dL — ABNORMAL LOW (ref >39.00)
LDL Cholesterol: 52 mg/dL (ref 0–99)
NonHDL: 73.47
Total CHOL/HDL Ratio: 3
Triglycerides: 105 mg/dL (ref 0.0–149.0)
VLDL: 21 mg/dL (ref 0.0–40.0)

## 2019-01-07 LAB — MAGNESIUM: Magnesium: 1.8 mg/dL (ref 1.5–2.5)

## 2019-01-07 LAB — HEMOGLOBIN A1C: Hgb A1c MFr Bld: 7.9 % — ABNORMAL HIGH (ref 4.6–6.5)

## 2019-01-07 MED FILL — ALLOPURINOL 300 MG TAB: 300 | 30 days supply | Qty: 30 | Fill #0

## 2019-01-24 MED FILL — JANUVIA 100 MG TABLET: 100 | 30 days supply | Qty: 30 | Fill #0

## 2019-02-07 MED FILL — ALLOPURINOL 300 MG TAB: 300 | 30 days supply | Qty: 30 | Fill #0

## 2019-02-07 MED FILL — predniSONE 5 MG TABS: 5 | 12 days supply | Qty: 48 | Fill #0

## 2019-02-09 ENCOUNTER — Other Ambulatory Visit: Payer: Self-pay | Admitting: Internal Medicine

## 2019-02-10 ENCOUNTER — Other Ambulatory Visit: Payer: Self-pay | Admitting: Internal Medicine

## 2019-02-11 MED FILL — CARVEDILOL 12.5 MG TABLET: 12.5 | 90 days supply | Qty: 180 | Fill #0

## 2019-02-23 MED FILL — JANUVIA 100 MG TABLET: 100 | 30 days supply | Qty: 30 | Fill #0

## 2019-02-23 MED FILL — cloNIDine HCL 0.2 MG TABS: 0.2 | 90 days supply | Qty: 180 | Fill #0

## 2019-02-23 MED FILL — ATORVASTATIN 10 MG TABLET: 10 | 90 days supply | Qty: 90 | Fill #0

## 2019-02-23 MED FILL — LISINOPRIL 20 MG TABLET: 20 | 90 days supply | Qty: 180 | Fill #0

## 2019-03-08 ENCOUNTER — Other Ambulatory Visit: Payer: Self-pay | Admitting: Internal Medicine

## 2019-03-08 MED FILL — ALLOPURINOL 300 MG TABS: 300 | 30 days supply | Qty: 30 | Fill #0

## 2019-03-08 MED FILL — metFORMIN HCL 1000 MG TABS: 1000 | 90 days supply | Qty: 225 | Fill #0

## 2019-03-10 MED FILL — predniSONE 5 MG TABS: 5 | 12 days supply | Qty: 48 | Fill #0

## 2019-04-14 DIAGNOSIS — E119 Type 2 diabetes mellitus without complications: Secondary | ICD-10-CM | POA: Diagnosis not present

## 2019-04-14 DIAGNOSIS — H04123 Dry eye syndrome of bilateral lacrimal glands: Secondary | ICD-10-CM | POA: Diagnosis not present

## 2019-04-14 DIAGNOSIS — H524 Presbyopia: Secondary | ICD-10-CM | POA: Diagnosis not present

## 2019-04-14 DIAGNOSIS — H43811 Vitreous degeneration, right eye: Secondary | ICD-10-CM | POA: Diagnosis not present

## 2019-04-14 DIAGNOSIS — H40013 Open angle with borderline findings, low risk, bilateral: Secondary | ICD-10-CM | POA: Diagnosis not present

## 2019-04-14 LAB — HM DIABETES EYE EXAM

## 2019-04-19 ENCOUNTER — Encounter: Payer: Self-pay | Admitting: Internal Medicine

## 2019-04-27 ENCOUNTER — Other Ambulatory Visit: Payer: Self-pay | Admitting: Internal Medicine

## 2019-04-27 DIAGNOSIS — Z6827 Body mass index (BMI) 27.0-27.9, adult: Secondary | ICD-10-CM | POA: Diagnosis not present

## 2019-04-27 DIAGNOSIS — E663 Overweight: Secondary | ICD-10-CM | POA: Diagnosis not present

## 2019-04-27 DIAGNOSIS — M1A09X1 Idiopathic chronic gout, multiple sites, with tophus (tophi): Secondary | ICD-10-CM | POA: Diagnosis not present

## 2019-04-27 LAB — HEPATIC FUNCTION PANEL
ALT: 16 (ref 10–40)
AST: 18 (ref 14–40)
Alkaline Phosphatase: 89 (ref 25–125)
Bilirubin, Total: 0.3

## 2019-04-28 LAB — BASIC METABOLIC PANEL
BUN: 14 (ref 4–21)
Creatinine: 1.2 (ref 0.6–1.3)
Glucose: 113
Potassium: 4.6 (ref 3.4–5.3)
Sodium: 136 — AB (ref 137–147)

## 2019-05-05 ENCOUNTER — Other Ambulatory Visit: Payer: Self-pay

## 2019-05-06 ENCOUNTER — Ambulatory Visit (INDEPENDENT_AMBULATORY_CARE_PROVIDER_SITE_OTHER): Payer: Medicare Other | Admitting: Internal Medicine

## 2019-05-06 ENCOUNTER — Encounter: Payer: Self-pay | Admitting: Internal Medicine

## 2019-05-06 VITALS — BP 130/90 | HR 78 | Temp 97.1°F | Resp 16 | Ht 72.0 in | Wt 200.2 lb

## 2019-05-06 DIAGNOSIS — R008 Other abnormalities of heart beat: Secondary | ICD-10-CM

## 2019-05-06 DIAGNOSIS — E782 Mixed hyperlipidemia: Secondary | ICD-10-CM | POA: Diagnosis not present

## 2019-05-06 DIAGNOSIS — Z125 Encounter for screening for malignant neoplasm of prostate: Secondary | ICD-10-CM

## 2019-05-06 DIAGNOSIS — I10 Essential (primary) hypertension: Secondary | ICD-10-CM

## 2019-05-06 DIAGNOSIS — Z0000 Encounter for general adult medical examination without abnormal findings: Secondary | ICD-10-CM

## 2019-05-06 DIAGNOSIS — E538 Deficiency of other specified B group vitamins: Secondary | ICD-10-CM | POA: Diagnosis not present

## 2019-05-06 DIAGNOSIS — E1169 Type 2 diabetes mellitus with other specified complication: Secondary | ICD-10-CM | POA: Diagnosis not present

## 2019-05-06 MED ORDER — TETANUS-DIPHTH-ACELL PERTUSSIS 5-2.5-18.5 LF-MCG/0.5 IM SUSP: 0.5000 mL | Freq: Once | INTRAMUSCULAR | 0 refills | Status: AC

## 2019-05-06 NOTE — Assessment & Plan Note (Signed)
-  Tdap printed today -pnm 23 2019 - pnm 13: 12/2016 - zostavax   @ [facility]: rec to get documentation? Shingrex?  Asked patient to find out. --CCS: Never had a scope, options d/w pt 2019  Failed to return cologuard; declined screening today -Prostate cancer screening: DRE -PSA wnl 2019.  Request another PSA.  Will do

## 2019-05-06 NOTE — Patient Instructions (Addendum)
GO TO THE LAB : Get the blood work     GO TO THE FRONT DESK Schedule your next appointment   for checkup in 6 months  Please ask [facility] if you have Zostavax or Shingrix (shots for the shingles)   Check the  blood pressure weekly BP GOAL is between 110/65 and  135/85. If it is consistently higher or lower, let me know

## 2019-05-06 NOTE — Progress Notes (Signed)
Pre visit review using our clinic review tool, if applicable. No additional management support is needed unless otherwise documented below in the visit note. 

## 2019-05-06 NOTE — Progress Notes (Signed)
Subjective:    Patient ID: Alex Church, male    DOB: 06-27-1946, 73 y.o.   MRN: [id number]  DOS:  05/06/2019 Type of visit - description: Routine checkup HTN: Reports BP last week at rheumatology was 126/68 Hypomagnesemia: On supplements, due for labs DM: No recent ambulatory CBGs.  No paresthesias. Gout: No recent events B12 deficiency: Currently on oral supplements, due for labs  Review of Systems Denies fever chills No chest pain no difficulty breathing No lower extremity edema No palpitations No nausea, vomiting, diarrhea  Past Medical History:  Diagnosis Date  . B12 deficiency    monthly shots  . Diabetes mellitus   . Hyperlipemia   . Hypertension     Past Surgical History:  Procedure Laterality Date  . NO PAST SURGERIES      Social History   Socioeconomic History  . Marital status: Married    Spouse name: Not on file  . Number of children: 0  . Years of education: Not on file  . Highest education level: Not on file  Occupational History  . Occupation: retired from [employer], works for a lab driving  Social Needs  . Financial resource strain: Not on file  . Food insecurity    Worry: Not on file    Inability: Not on file  . Transportation needs    Medical: Not on file    Non-medical: Not on file  Tobacco Use  . Smoking status: Never Smoker  . Smokeless tobacco: Never Used  Substance and Sexual Activity  . Alcohol use: No  . Drug use: No  . Sexual activity: Not on file  Lifestyle  . Physical activity    Days per week: Not on file    Minutes per session: Not on file  . Stress: Not on file  Relationships  . Social Herbalist on phone: Not on file    Gets together: Not on file    Attends religious service: Not on file    Active member of club or organization: Not on file    Attends meetings of clubs or organizations: Not on file    Relationship status: Not on file  . Intimate partner violence    Fear of current or ex partner: Not on file     Emotionally abused: Not on file    Physically abused: Not on file    Forced sexual activity: Not on file  Other Topics Concern  . Not on file  Social History Narrative   Married (2nd marriage)   Lives w/ wife and 1 dog (german sheppard)       Allergies as of 05/06/2019   No Known Allergies     Medication List       Accurate as of May 06, 2019 11:59 PM. If you have any questions, ask your nurse or doctor.        STOP taking these medications   NON FORMULARY Stopped by: Kathlene November, MD     TAKE these medications   allopurinol 100 MG tablet Commonly known as: ZYLOPRIM Take 100 mg by mouth 2 (two) times daily.   aspirin 81 MG tablet Take 81 mg by mouth daily.   atorvastatin 10 MG tablet Commonly known as: LIPITOR Take 1 tablet (10 mg total) by mouth at bedtime.   CALCIUM + D PO Take 1,000 mg by mouth daily.   carvedilol 12.5 MG tablet Commonly known as: COREG Take 1 tablet (12.5 mg total) by mouth 2 (two)  times daily with a meal.   cloNIDine 0.2 MG tablet Commonly known as: CATAPRES Take 1 tablet (0.2 mg total) by mouth 2 (two) times daily.   colchicine 0.6 MG tablet Take 0.6 mg by mouth daily.   Fish Oil 1000 MG Caps Take by mouth.   Glucosamine-Chondroitin 500-400 MG Caps Take 3 capsules by mouth 2 (two) times daily.   glucose blood test strip Commonly known as: ONE TOUCH ULTRA TEST Check blood sugar once daily   Januvia 100 MG tablet Generic drug: sitaGLIPtin Take 1 tablet (100 mg total) by mouth daily.   lisinopril 20 MG tablet Commonly known as: ZESTRIL Take 1 tablet (20 mg total) by mouth 2 (two) times daily.   Magnesium Oxide 400 MG Caps Take 2 capsules (800 mg total) by mouth 2 (two) times daily.   meloxicam 7.5 MG tablet Commonly known as: MOBIC Take 1 tablet (7.5 mg total) by mouth daily as needed for pain.   metFORMIN 1000 MG tablet Commonly known as: GLUCOPHAGE TAKE 1 AND 1/2 TABLETS BY MOUTH EVERY MORNING AND 1 TABLET BY  MOUTH EVERY EVENING   multivitamin tablet Take 1 tablet by mouth daily.   ONE TOUCH LANCETS Misc Check blood sugar once daily   OneTouch Verio Flex System w/Device Kit Check blood sugar once daily   Tdap 5-2.5-18.5 LF-MCG/0.5 injection Commonly known as: BOOSTRIX Inject 0.5 mLs into the muscle once for 1 dose. Started by: Kathlene November, MD           Objective:   Physical Exam BP 130/90 (BP Location: Right Arm)   Pulse 78   Temp (!) 97.1 F (36.2 C) (Temporal)   Resp 16   Ht 6' (1.829 m)   Wt 200 lb 4 oz (90.8 kg)   SpO2 95%   BMI 27.16 kg/m  General:   Well developed, NAD, BMI noted.  HEENT:  Normocephalic . Face symmetric, atraumatic Lungs:  CTA B Normal respiratory effort, no intercostal retractions, no accessory muscle use. Heart: RRR.  No murmur.  no pretibial edema bilaterally  Diabetic foot exam: No edema, good pedal pulses, pinprick examination normal.  Nails thick, somewhat dystrophic Skin: Not pale. Not jaundice Neurologic:  alert & oriented X3.  Speech normal, gait appropriate for age and unassisted Psych--  Cognition and judgment appear intact.  Cooperative with normal attention span and concentration.  Behavior appropriate. No anxious or depressed appearing.     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 +FH CAD  PLAN: Preventive care reviewed today Labs from 04/27/2019: LFTs normal, creatinine 1.1. HTN: On lisinopril, clonidine, carvedilol.  Recent BMP satisfactory, BP last week at rheumatology normal, today slightly elevated upon arrival, recheck: 135/90.  No change, monitor at home. 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  Feet exam negative.  Recent BMP LFTs normal.  Check A1c B12  deficiency: Used to be on shots, now on oral supplements, checking levels Hypomagnesemia: On supplements 2 tablets twice daily.  Checking levels   RTC 6 months

## 2019-05-07 LAB — MAGNESIUM: Magnesium: 1.7 mg/dL (ref 1.5–2.5)

## 2019-05-07 LAB — PSA: PSA: 1.3 ng/mL (ref <=4.0)

## 2019-05-07 LAB — HEMOGLOBIN A1C
Hgb A1c MFr Bld: 7.4 % of total Hgb — ABNORMAL HIGH (ref <5.7)
Mean Plasma Glucose: 166 (calc)
eAG (mmol/L): 9.2 (calc)

## 2019-05-07 LAB — VITAMIN B12: Vitamin B-12: >2000 pg/mL — ABNORMAL HIGH (ref 200–1100)

## 2019-05-07 NOTE — Assessment & Plan Note (Signed)
Preventive care reviewed today Labs from 04/27/2019: LFTs normal, creatinine 1.1. HTN: On lisinopril, clonidine, carvedilol.  Recent BMP satisfactory, BP last week at rheumatology normal, today slightly elevated upon arrival, recheck: 135/90.  No change, monitor at home. 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  Feet exam negative.  Recent BMP LFTs normal.  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  Checking levels   RTC 6 months

## 2019-05-09 ENCOUNTER — Other Ambulatory Visit: Payer: Self-pay | Admitting: Internal Medicine

## 2019-05-10 ENCOUNTER — Encounter: Payer: Self-pay | Admitting: Internal Medicine

## 2019-05-13 ENCOUNTER — Encounter: Payer: Self-pay | Admitting: Internal Medicine

## 2019-07-07 ENCOUNTER — Other Ambulatory Visit: Payer: Self-pay

## 2019-07-07 ENCOUNTER — Ambulatory Visit (INDEPENDENT_AMBULATORY_CARE_PROVIDER_SITE_OTHER): Payer: Medicare Other

## 2019-07-07 DIAGNOSIS — Z23 Encounter for immunization: Secondary | ICD-10-CM | POA: Diagnosis not present

## 2019-07-25 ENCOUNTER — Other Ambulatory Visit: Payer: Self-pay | Admitting: Internal Medicine

## 2019-07-29 ENCOUNTER — Other Ambulatory Visit: Payer: Self-pay

## 2019-08-08 ENCOUNTER — Ambulatory Visit: Payer: Medicare Other | Admitting: *Deleted

## 2019-08-12 NOTE — Progress Notes (Signed)
Virtual Visit via Video Note  I connected with patient on 08/15/19 at  2:30 PM EST by audio enabled telemedicine application and verified that I am speaking with the correct person using two identifiers.   THIS ENCOUNTER IS A VIRTUAL VISIT DUE TO COVID-19 - PATIENT WAS NOT SEEN IN THE OFFICE. PATIENT HAS CONSENTED TO VIRTUAL VISIT / TELEMEDICINE VISIT   Location of patient: home  Location of provider: office  I discussed the limitations of evaluation and management by telemedicine and the availability of in person appointments. The patient expressed understanding and agreed to proceed.   Subjective:   ADITYA NASTASI is a 73 y.o. male who presents for Medicare Annual/Subsequent preventive examination.  Pt still works for [employer] 30 hrs per week. 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   Review of Systems:  Home Safety/Smoke Alarms: Feels safe in home. Smoke alarms in place.  Lives w/ wife in 2 story home. No issues w/ stairs.   Male:   CCS- declines    PSA-  Lab Results  Component Value Date   PSA 1.3 05/06/2019   PSA 1.80 01/25/2018   PSA 1.40 08/12/2016   Eye- Dr.Weaver every 6 months per pt. Next appt 10/2019.    Objective:    Vitals: Unable to assess. This visit is enabled though telemedicine due to Covid 19.   Advanced Directives 08/15/2019 08/02/2018  Does Patient Have a Medical Advance Directive? No Yes  Type of Advance Directive - [place];Living will  Copy of [place] in Chart? - No - copy requested  Would patient like information on creating a medical advance directive? No - Patient declined -    Tobacco Social History   Tobacco Use  Smoking Status Never Smoker  Smokeless Tobacco Never Used     Counseling given: Not Answered   Clinical Intake: Pain : No/denies pain     Past Medical History:  Diagnosis Date  . B12 deficiency    monthly shots  . Diabetes mellitus   . Hyperlipemia    . Hypertension    Past Surgical History:  Procedure Laterality Date  . NO PAST SURGERIES     Family History  Problem Relation Age of Onset  . Diabetes Maternal Uncle   . Heart attack Brother 45  . Cancer Mother        intra-abdominal  . Cirrhosis Cousin        maternal  . Diabetes Cousin        maternal  . Stroke Neg Hx   . Colon cancer Neg Hx   . Prostate cancer Neg Hx    Social History   Socioeconomic History  . Marital status: Married    Spouse name: Not on file  . Number of children: 0  . Years of education: Not on file  . Highest education level: Not on file  Occupational History  . Occupation: retired from [employer], works for a lab driving  Social Needs  . Financial resource strain: Not on file  . Food insecurity    Worry: Not on file    Inability: Not on file  . Transportation needs    Medical: Not on file    Non-medical: Not on file  Tobacco Use  . Smoking status: Never Smoker  . Smokeless tobacco: Never Used  Substance and Sexual Activity  . Alcohol use: No  . Drug use: No  . Sexual activity: Not on file  Lifestyle  .  Physical activity    Days per week: Not on file    Minutes per session: Not on file  . Stress: Not on file  Relationships  . Social Herbalist on phone: Not on file    Gets together: Not on file    Attends religious service: Not on file    Active member of club or organization: Not on file    Attends meetings of clubs or organizations: Not on file    Relationship status: Not on file  Other Topics Concern  . Not on file  Social History Narrative   Married (2nd marriage)   Lives w/ wife and 1 dog (german sheppard)     Outpatient Encounter Medications as of 08/15/2019  Medication Sig  . allopurinol (ZYLOPRIM) 100 MG tablet Take 100 mg by mouth 2 (two) times daily.  Marland Kitchen atorvastatin (LIPITOR) 10 MG tablet Take 1 tablet (10 mg total) by mouth at bedtime.  . Blood Glucose Monitoring Suppl ([place]) w/Device  KIT Check blood sugar once daily  . Calcium Carbonate-Vitamin D (CALCIUM + D PO) Take 1,000 mg by mouth daily.    . carvedilol (COREG) 12.5 MG tablet Take 1 tablet (12.5 mg total) by mouth 2 (two) times daily with a meal.  . cloNIDine (CATAPRES) 0.2 MG tablet Take 1 tablet (0.2 mg total) by mouth 2 (two) times daily.  . colchicine 0.6 MG tablet Take 0.6 mg by mouth daily.  . Glucosamine-Chondroitin 500-400 MG CAPS Take 3 capsules by mouth 2 (two) times daily.  Marland Kitchen glucose blood (ONE TOUCH ULTRA TEST) test strip Check blood sugar once daily  . JANUVIA 100 MG tablet Take 1 tablet (100 mg total) by mouth daily.  Marland Kitchen lisinopril (ZESTRIL) 20 MG tablet Take 1 tablet (20 mg total) by mouth 2 (two) times daily.  . Magnesium Oxide 400 MG CAPS Take 2 capsules (800 mg total) by mouth 2 (two) times daily.  . metFORMIN (GLUCOPHAGE) 1000 MG tablet TAKE 1 AND 1/2 TABLETS BY MOUTH EVERY MORNING AND 1 TABLET BY MOUTH EVERY EVENING  . Multiple Vitamin (MULTIVITAMIN) tablet Take 1 tablet by mouth daily.    . Omega-3 Fatty Acids (FISH OIL) 1000 MG CAPS Take by mouth.  Marland Kitchen aspirin 81 MG tablet Take 81 mg by mouth daily.    . meloxicam (MOBIC) 7.5 MG tablet Take 1 tablet (7.5 mg total) by mouth daily as needed for pain. (Patient not taking: Reported on 08/15/2019)  . ONE TOUCH LANCETS MISC Check blood sugar once daily   No facility-administered encounter medications on file as of 08/15/2019.     Activities of Daily Living In your present state of health, do you have any difficulty performing the following activities: 08/15/2019 05/06/2019  Hearing? N N  Vision? N N  Difficulty concentrating or making decisions? N N  Walking or climbing stairs? N N  Dressing or bathing? N N  Doing errands, shopping? N N  Preparing Food and eating ? N -  Using the Toilet? N -  In the past six months, have you accidently leaked urine? N -  Do you have problems with loss of bowel control? N -  Managing your Medications? N -  Managing  your Finances? N -  Housekeeping or managing your Housekeeping? N -  Some recent data might be hidden    Patient Care Team: Colon Branch, MD as PCP - General (Internal Medicine) Monna Fam, MD as Consulting Physician (Ophthalmology) Bjorn Pippin,  PA-C as Librarian, academic (Rheumatology)   Assessment:   This is a routine wellness examination for Frederich. Physical assessment deferred to PCP.  Exercise Activities and Dietary recommendations Current Exercise Habits: The patient has a physically strenuous job, but has no regular exercise apart from work.(pt stays very active daily), Exercise limited by: None identified   Diet (meal preparation, eat out, water intake, caffeinated beverages, dairy products, fruits and vegetables): in general, a "healthy" diet  , well balanced   Goals    . Maintain healthy active lifestyle.       Fall Risk Fall Risk  08/15/2019 05/06/2019 08/02/2018 01/25/2018 06/30/2016  Falls in the past year? 0 0 0 No No  Follow up Education provided;Falls prevention discussed Falls evaluation completed - - -    Depression Screen PHQ 2/9 Scores 05/06/2019 08/02/2018 01/25/2018 06/30/2016  PHQ - 2 Score 0 0 0 0    Cognitive Function   Ad8 score reviewed for issues:  Issues making decisions:no  Less interest in hobbies / activities:no  Repeats questions, stories (family complaining):no  Trouble using ordinary gadgets (microwave, computer, phone):no  Forgets the month or year: no  Mismanaging finances: no  Remembering appts:no  Daily problems with thinking and/or memory:no Ad8 score is=0       Immunization History  Administered Date(s) Administered  . Fluad Quad(high Dose 65+) 07/07/2019  . Influenza Split 06/04/2012  . Influenza, High Dose Seasonal PF 06/27/2015, 06/30/2016, 06/26/2017, 07/02/2018  . Influenza,inj,Quad PF,6+ Mos 06/17/2013, 06/23/2014  . Pneumococcal Conjugate-13 12/19/2016  . Pneumococcal Polysaccharide-23 01/25/2018  .  Td 09/08/2008  . Zoster 03/18/2013    Screening Tests Health Maintenance  Topic Date Due  . COLONOSCOPY  05/16/1996  . TETANUS/TDAP  09/08/2018  . HEMOGLOBIN A1C  11/06/2019  . OPHTHALMOLOGY EXAM  04/13/2020  . FOOT EXAM  05/05/2020  . INFLUENZA VACCINE  Completed  . Hepatitis C Screening  Completed  . PNA vac Low Risk Adult  Completed       Plan:   See you next year!  Continue to eat heart healthy diet (full of fruits, vegetables, whole grains, lean protein, water--limit salt, fat, and sugar intake) and increase physical activity as tolerated.  Continue doing brain stimulating activities (puzzles, reading, adult coloring books, staying active) to keep memory sharp.   Bring a copy of your living will and/or healthcare power of attorney to your next office visit.   I have personally reviewed and noted the following in the patient's chart:   . Medical and social history . Use of alcohol, tobacco or illicit drugs  . Current medications and supplements . Functional ability and status . Nutritional status . Physical activity . Advanced directives . List of other physicians . Hospitalizations, surgeries, and ER visits in previous 12 months . Vitals . Screenings to include cognitive, depression, and falls . Referrals and appointments  In addition, I have reviewed and discussed with patient certain preventive protocols, quality metrics, and best practice recommendations. A written personalized care plan for preventive services as well as general preventive health recommendations were provided to patient.     Shela Nevin, [place]  08/15/2019

## 2019-08-15 ENCOUNTER — Other Ambulatory Visit: Payer: Self-pay

## 2019-08-15 ENCOUNTER — Ambulatory Visit (INDEPENDENT_AMBULATORY_CARE_PROVIDER_SITE_OTHER): Payer: Medicare Other | Admitting: *Deleted

## 2019-08-15 ENCOUNTER — Encounter: Payer: Self-pay | Admitting: *Deleted

## 2019-08-15 DIAGNOSIS — Z0000 Encounter for general adult medical examination without abnormal findings: Secondary | ICD-10-CM

## 2019-08-15 NOTE — Patient Instructions (Signed)
See you next year!  Continue to eat heart healthy diet (full of fruits, vegetables, whole grains, lean protein, water--limit salt, fat, and sugar intake) and increase physical activity as tolerated.  Continue doing brain stimulating activities (puzzles, reading, adult coloring books, staying active) to keep memory sharp.   Bring a copy of your living will and/or healthcare power of attorney to your next office visit.   Alex Church , Thank you for taking time to come for your Medicare Wellness Visit. I appreciate your ongoing commitment to your health goals. Please review the following plan we discussed and let me know if I can assist you in the future.   These are the goals we discussed: Goals    . Maintain healthy active lifestyle.       This is a list of the screening recommended for you and due dates:  Health Maintenance  Topic Date Due  . Colon Cancer Screening  05/16/1996  . Tetanus Vaccine  09/08/2018  . Hemoglobin A1C  11/06/2019  . Eye exam for diabetics  04/13/2020  . Complete foot exam   05/05/2020  . Flu Shot  Completed  .  Hepatitis C: One time screening is recommended by Center for Disease Control  (CDC) for  adults born from [birth year] through 1965.   Completed  . Pneumonia vaccines  Completed    Preventive Care 73 Years and Older, Male Preventive care refers to lifestyle choices and visits with your health care provider that can promote health and wellness. This includes:  A yearly physical exam. This is also called an annual well check.  Regular dental and eye exams.  Immunizations.  Screening for certain conditions.  Healthy lifestyle choices, such as diet and exercise. What can I expect for my preventive care visit? Physical exam Your health care provider will check:  Height and weight. These may be used to calculate body mass index (BMI), which is a measurement that tells if you are at a healthy weight.  Heart rate and blood pressure.  Your skin for  abnormal spots. Counseling Your health care provider may ask you questions about:  Alcohol, tobacco, and drug use.  Emotional well-being.  Home and relationship well-being.  Sexual activity.  Eating habits.  History of falls.  Memory and ability to understand (cognition).  Work and work Statistician. What immunizations do I need?  Influenza (flu) vaccine  This is recommended every year. Tetanus, diphtheria, and pertussis (Tdap) vaccine  You may need a Td booster every 10 years. Varicella (chickenpox) vaccine  You may need this vaccine if you have not already been vaccinated. Zoster (shingles) vaccine  You may need this after age 53. Pneumococcal conjugate (PCV13) vaccine  One dose is recommended after age 11. Pneumococcal polysaccharide (PPSV23) vaccine  One dose is recommended after age 27. Measles, mumps, and rubella (MMR) vaccine  You may need at least one dose of MMR if you were born in 1957 or later. You may also need a second dose. Meningococcal conjugate (MenACWY) vaccine  You may need this if you have certain conditions. Hepatitis A vaccine  You may need this if you have certain conditions or if you travel or work in places where you may be exposed to hepatitis A. Hepatitis B vaccine  You may need this if you have certain conditions or if you travel or work in places where you may be exposed to hepatitis B. Haemophilus influenzae type b (Hib) vaccine  You may need this if you have certain conditions.  You may receive vaccines as individual doses or as more than one vaccine together in one shot (combination vaccines). Talk with your health care provider about the risks and benefits of combination vaccines. What tests do I need? Blood tests  Lipid and cholesterol levels. These may be checked every 5 years, or more frequently depending on your overall health.  Hepatitis C test.  Hepatitis B test. Screening  Lung cancer screening. You may have this  screening every year starting at age 64 if you have a 30-pack-year history of smoking and currently smoke or have quit within the past 15 years.  Colorectal cancer screening. All adults should have this screening starting at age 57 and continuing until age 110. Your health care provider may recommend screening at age 62 if you are at increased risk. You will have tests every 1-10 years, depending on your results and the type of screening test.  Prostate cancer screening. Recommendations will vary depending on your family history and other risks.  Diabetes screening. This is done by checking your blood sugar (glucose) after you have not eaten for a while (fasting). You may have this done every 1-3 years.  Abdominal aortic aneurysm (AAA) screening. You may need this if you are a current or former smoker.  Sexually transmitted disease (STD) testing. Follow these instructions at home: Eating and drinking  Eat a diet that includes fresh fruits and vegetables, whole grains, lean protein, and low-fat dairy products. Limit your intake of foods with high amounts of sugar, saturated fats, and salt.  Take vitamin and mineral supplements as recommended by your health care provider.  Do not drink alcohol if your health care provider tells you not to drink.  If you drink alcohol: ? Limit how much you have to 0-2 drinks a day. ? Be aware of how much alcohol is in your drink. In the [place] one drink equals one 12 oz bottle of beer (355 mL), one 5 oz glass of wine (148 mL), or one 1 oz glass of hard liquor (44 mL). Lifestyle  Take daily care of your teeth and gums.  Stay active. Exercise for at least 30 minutes on 5 or more days each week.  Do not use any products that contain nicotine or tobacco, such as cigarettes, e-cigarettes, and chewing tobacco. If you need help quitting, ask your health care provider.  If you are sexually active, practice safe sex. Use a condom or other form of protection to  prevent STIs (sexually transmitted infections).  Talk with your health care provider about taking a low-dose aspirin or statin. What's next?  Visit your health care provider once a year for a well check visit.  Ask your health care provider how often you should have your eyes and teeth checked.  Stay up to date on all vaccines. This information is not intended to replace advice given to you by your health care provider. Make sure you discuss any questions you have with your health care provider. Document Released: 09/21/2015 Document Revised: 08/19/2018 Document Reviewed: 08/19/2018 Elsevier Patient Education  2020 [employer].

## 2019-10-27 ENCOUNTER — Other Ambulatory Visit: Payer: Self-pay | Admitting: Internal Medicine

## 2019-11-01 DIAGNOSIS — E663 Overweight: Secondary | ICD-10-CM | POA: Diagnosis not present

## 2019-11-01 DIAGNOSIS — M1A09X1 Idiopathic chronic gout, multiple sites, with tophus (tophi): Secondary | ICD-10-CM | POA: Diagnosis not present

## 2019-11-01 DIAGNOSIS — Z6828 Body mass index (BMI) 28.0-28.9, adult: Secondary | ICD-10-CM | POA: Diagnosis not present

## 2019-11-08 ENCOUNTER — Ambulatory Visit: Payer: Medicare Other | Admitting: Internal Medicine

## 2019-11-15 DIAGNOSIS — H40013 Open angle with borderline findings, low risk, bilateral: Secondary | ICD-10-CM | POA: Diagnosis not present

## 2019-11-25 ENCOUNTER — Encounter: Payer: Self-pay | Admitting: Internal Medicine

## 2019-12-08 ENCOUNTER — Other Ambulatory Visit: Payer: Self-pay

## 2019-12-12 ENCOUNTER — Other Ambulatory Visit: Payer: Self-pay

## 2019-12-12 ENCOUNTER — Encounter: Payer: Self-pay | Admitting: Internal Medicine

## 2019-12-12 ENCOUNTER — Ambulatory Visit (INDEPENDENT_AMBULATORY_CARE_PROVIDER_SITE_OTHER): Payer: Medicare Other | Admitting: Internal Medicine

## 2019-12-12 VITALS — BP 159/63 | HR 72 | Temp 98.1°F | Resp 16 | Ht 72.0 in | Wt 204.0 lb

## 2019-12-12 DIAGNOSIS — E042 Nontoxic multinodular goiter: Secondary | ICD-10-CM | POA: Diagnosis not present

## 2019-12-12 DIAGNOSIS — E782 Mixed hyperlipidemia: Secondary | ICD-10-CM | POA: Diagnosis not present

## 2019-12-12 DIAGNOSIS — I10 Essential (primary) hypertension: Secondary | ICD-10-CM

## 2019-12-12 DIAGNOSIS — R790 Abnormal level of blood mineral: Secondary | ICD-10-CM

## 2019-12-12 DIAGNOSIS — E1169 Type 2 diabetes mellitus with other specified complication: Secondary | ICD-10-CM

## 2019-12-12 NOTE — Progress Notes (Signed)
Pre visit review using our clinic review tool, if applicable. No additional management support is needed unless otherwise documented below in the visit note. 

## 2019-12-12 NOTE — Patient Instructions (Addendum)
Check your blood pressures every 2 weeks BP GOAL is between 110/65 and  135/85. If it is consistently higher or lower, let me know    GO TO THE LAB : Get the blood work     [facility], please reschedule your appointments Come back for   a physical exam in 4 months

## 2019-12-12 NOTE — Progress Notes (Signed)
Subjective:    Patient ID: Alex Church, male    DOB: 03-01-1946, 74 y.o.   MRN: [id number]  DOS:  12/12/2019 Type of visit - description: Follow-up In general feeling well, today we talk about his blood pressure, cholesterol, diabetes and hypomagnesemia. Also request to check a uric acid and send results to rheumatology   Review of Systems Reports good compliance to medications. Denies chest pain, difficulty breathing or palpitations  Past Medical History:  Diagnosis Date  . B12 deficiency    monthly shots  . Diabetes mellitus   . Hyperlipemia   . Hypertension     Past Surgical History:  Procedure Laterality Date  . NO PAST SURGERIES      Allergies as of 12/12/2019   No Known Allergies     Medication List       Accurate as of December 12, 2019 11:59 PM. If you have any questions, ask your nurse or doctor.        allopurinol 100 MG tablet Commonly known as: ZYLOPRIM Take 100 mg by mouth 2 (two) times daily.   aspirin 81 MG tablet Take 81 mg by mouth daily.   atorvastatin 10 MG tablet Commonly known as: LIPITOR Take 1 tablet (10 mg total) by mouth at bedtime.   CALCIUM + D PO Take 1,000 mg by mouth daily.   carvedilol 12.5 MG tablet Commonly known as: COREG Take 1 tablet (12.5 mg total) by mouth 2 (two) times daily with a meal.   cloNIDine 0.2 MG tablet Commonly known as: CATAPRES Take 1 tablet (0.2 mg total) by mouth 2 (two) times daily.   colchicine 0.6 MG tablet Take 0.6 mg by mouth daily.   Fish Oil 1000 MG Caps Take by mouth.   Glucosamine-Chondroitin 500-400 MG Caps Take 3 capsules by mouth 2 (two) times daily.   glucose blood test strip Commonly known as: ONE TOUCH ULTRA TEST Check blood sugar once daily   Januvia 100 MG tablet Generic drug: sitaGLIPtin Take 1 tablet (100 mg total) by mouth daily.   lisinopril 20 MG tablet Commonly known as: ZESTRIL Take 1 tablet (20 mg total) by mouth 2 (two) times daily.   Magnesium Oxide 400 MG  Caps Take 2 capsules (800 mg total) by mouth 2 (two) times daily.   meloxicam 7.5 MG tablet Commonly known as: MOBIC Take 1 tablet (7.5 mg total) by mouth daily as needed for pain.   metFORMIN 1000 MG tablet Commonly known as: GLUCOPHAGE TAKE 1 AND 1/2 TABLETS BY MOUTH EVERY MORNING AND 1 TABLET BY MOUTH EVERY EVENING   multivitamin tablet Take 1 tablet by mouth daily.   ONE TOUCH LANCETS Misc Check blood sugar once daily   OneTouch Verio Flex System w/Device Kit Check blood sugar once daily          Objective:   Physical Exam BP (!) 159/63 (BP Location: Left Arm, Patient Position: Sitting, Cuff Size: Normal)   Pulse 72   Temp 98.1 F (36.7 C) (Temporal)   Resp 16   Ht 6' (1.829 m)   Wt 204 lb (92.5 kg)   SpO2 98%   BMI 27.67 kg/m  General:   Well developed, NAD, BMI noted. HEENT:  Normocephalic . Face symmetric, atraumatic Lungs:  CTA B Normal respiratory effort, no intercostal retractions, no accessory muscle use. Heart: RRR (very rarely has a skipped beat),  no murmur.  Lower extremities: Trace pretibial edema bilaterally  Skin: Not pale. Not jaundice Neurologic:  alert &  oriented X3.  Speech normal, gait appropriate for age and unassisted Psych--  Cognition and judgment appear intact.  Cooperative with normal attention span and concentration.  Behavior appropriate. No anxious or depressed appearing.      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 +FH CAD  PLAN: DM: Currently on Januvia and Metformin, check A1c HTN: BP today slightly elevated, few days ago at rheumatology was 120/60.  No change, continue carvedilol, clonidine, Zestril.  Check a CMP and CBC Hypomagnesemia: On supplements, checking labs Tophaceous gout: Checking a uric acid, will  forward results to rheumatology. Preventive care: Had 2 Covid vaccinations, encouraged to take a Tdap RTC CPX 4 months   This visit occurred during the SARS-CoV-2 public health emergency.  Safety protocols were in place, including screening questions prior to the visit, additional usage of staff PPE, and extensive cleaning of exam room while observing appropriate contact time as indicated for disinfecting solutions.

## 2019-12-13 LAB — CBC WITH DIFFERENTIAL/PLATELET
Basophils Absolute: 0.1 10*3/uL (ref 0.0–0.1)
Basophils Relative: 1.2 % (ref 0.0–3.0)
Eosinophils Absolute: 0.5 10*3/uL (ref 0.0–0.7)
Eosinophils Relative: 5.2 % — ABNORMAL HIGH (ref 0.0–5.0)
HCT: 38.1 % — ABNORMAL LOW (ref 39.0–52.0)
Hemoglobin: 12.8 g/dL — ABNORMAL LOW (ref 13.0–17.0)
Lymphocytes Relative: 26.5 % (ref 12.0–46.0)
Lymphs Abs: 2.3 10*3/uL (ref 0.7–4.0)
MCHC: 33.5 g/dL (ref 30.0–36.0)
MCV: 92.7 fl (ref 78.0–100.0)
Monocytes Absolute: 0.7 10*3/uL (ref 0.1–1.0)
Monocytes Relative: 8.6 % (ref 3.0–12.0)
Neutro Abs: 5.1 10*3/uL (ref 1.4–7.7)
Neutrophils Relative %: 58.5 % (ref 43.0–77.0)
Platelets: 186 10*3/uL (ref 150.0–400.0)
RBC: 4.1 Mil/uL — ABNORMAL LOW (ref 4.22–5.81)
RDW: 14.5 % (ref 11.5–15.5)
WBC: 8.7 10*3/uL (ref 4.0–10.5)

## 2019-12-13 LAB — HEMOGLOBIN A1C: Hgb A1c MFr Bld: 7.2 % — ABNORMAL HIGH (ref 4.6–6.5)

## 2019-12-13 LAB — COMPREHENSIVE METABOLIC PANEL
ALT: 16 U/L (ref 0–53)
AST: 16 U/L (ref 0–37)
Albumin: 4.2 g/dL (ref 3.5–5.2)
Alkaline Phosphatase: 73 U/L (ref 39–117)
BUN: 20 mg/dL (ref 6–23)
CO2: 29 mEq/L (ref 19–32)
Calcium: 9.6 mg/dL (ref 8.4–10.5)
Chloride: 100 mEq/L (ref 96–112)
Creatinine, Ser: 1.16 mg/dL (ref 0.40–1.50)
GFR: 61.62 mL/min (ref >60.00)
Glucose, Bld: 121 mg/dL — ABNORMAL HIGH (ref 70–99)
Potassium: 4.2 mEq/L (ref 3.5–5.1)
Sodium: 136 mEq/L (ref 135–145)
Total Bilirubin: 0.4 mg/dL (ref 0.2–1.2)
Total Protein: 6.6 g/dL (ref 6.0–8.3)

## 2019-12-13 LAB — LIPID PANEL
Cholesterol: 108 mg/dL (ref 0–200)
HDL: 38.5 mg/dL — ABNORMAL LOW (ref >39.00)
LDL Cholesterol: 46 mg/dL (ref 0–99)
NonHDL: 69.97
Total CHOL/HDL Ratio: 3
Triglycerides: 120 mg/dL (ref 0.0–149.0)
VLDL: 24 mg/dL (ref 0.0–40.0)

## 2019-12-13 LAB — MAGNESIUM: Magnesium: 1.7 mg/dL (ref 1.5–2.5)

## 2019-12-13 LAB — URIC ACID: Uric Acid, Serum: 5.2 mg/dL (ref 4.0–7.8)

## 2019-12-14 NOTE — Assessment & Plan Note (Signed)
DM: Currently on Januvia and Metformin, check A1c HTN: BP today slightly elevated, few days ago at rheumatology was 120/60.  No change, continue carvedilol, clonidine, Zestril.  Check a CMP and CBC Hypomagnesemia: On supplements, checking labs Tophaceous gout: Checking a uric acid, will forward results to rheumatology. Preventive care: Had 2 Covid vaccinations, encouraged to take a Tdap RTC CPX 4 months

## 2019-12-16 MED ORDER — RYBELSUS 3 MG PO TABS: 3.0000 mg | ORAL_TABLET | Freq: Every day | ORAL | 1 refills | Status: DC

## 2019-12-16 NOTE — Addendum Note (Signed)
Addended byDamita Dunnings D on: 12/16/2019 09:58 AM   Modules accepted: Orders

## 2020-01-30 ENCOUNTER — Other Ambulatory Visit: Payer: Self-pay | Admitting: Internal Medicine

## 2020-02-09 ENCOUNTER — Encounter: Payer: Self-pay | Admitting: Internal Medicine

## 2020-02-09 MED ORDER — RYBELSUS 7 MG PO TABS: 7.0000 mg | ORAL_TABLET | Freq: Every day | ORAL | 3 refills | Status: DC

## 2020-02-29 ENCOUNTER — Other Ambulatory Visit: Payer: Self-pay | Admitting: Internal Medicine

## 2020-03-23 ENCOUNTER — Other Ambulatory Visit: Payer: Self-pay | Admitting: Internal Medicine

## 2020-03-23 ENCOUNTER — Encounter: Payer: Self-pay | Admitting: Internal Medicine

## 2020-03-23 MED ORDER — PIOGLITAZONE HCL 30 MG PO TABS: 30.0000 mg | ORAL_TABLET | Freq: Every day | ORAL | 6 refills | Status: DC

## 2020-04-10 ENCOUNTER — Other Ambulatory Visit: Payer: Self-pay | Admitting: Internal Medicine

## 2020-04-11 ENCOUNTER — Encounter: Payer: Self-pay | Admitting: Internal Medicine

## 2020-04-11 MED ORDER — GLUCOSE BLOOD VI STRP: ORAL_STRIP | 12 refills | Status: DC

## 2020-04-16 ENCOUNTER — Encounter: Payer: Self-pay | Admitting: Internal Medicine

## 2020-04-16 DIAGNOSIS — H25013 Cortical age-related cataract, bilateral: Secondary | ICD-10-CM | POA: Diagnosis not present

## 2020-04-16 DIAGNOSIS — H40013 Open angle with borderline findings, low risk, bilateral: Secondary | ICD-10-CM | POA: Diagnosis not present

## 2020-04-16 DIAGNOSIS — H2513 Age-related nuclear cataract, bilateral: Secondary | ICD-10-CM | POA: Diagnosis not present

## 2020-04-16 DIAGNOSIS — E119 Type 2 diabetes mellitus without complications: Secondary | ICD-10-CM | POA: Diagnosis not present

## 2020-04-16 LAB — HM DIABETES EYE EXAM

## 2020-04-25 ENCOUNTER — Encounter: Payer: Self-pay | Admitting: Internal Medicine

## 2020-04-30 ENCOUNTER — Other Ambulatory Visit: Payer: Self-pay | Admitting: Internal Medicine

## 2020-04-30 DIAGNOSIS — H40009 Preglaucoma, unspecified, unspecified eye: Secondary | ICD-10-CM | POA: Insufficient documentation

## 2020-05-10 ENCOUNTER — Other Ambulatory Visit: Payer: Self-pay | Admitting: Internal Medicine

## 2020-05-11 ENCOUNTER — Encounter: Payer: Medicare Other | Admitting: Internal Medicine

## 2020-06-20 ENCOUNTER — Other Ambulatory Visit: Payer: Self-pay

## 2020-06-20 ENCOUNTER — Ambulatory Visit (INDEPENDENT_AMBULATORY_CARE_PROVIDER_SITE_OTHER): Payer: Medicare Other | Admitting: Internal Medicine

## 2020-06-20 ENCOUNTER — Encounter: Payer: Self-pay | Admitting: Internal Medicine

## 2020-06-20 VITALS — BP 152/77 | HR 82 | Temp 97.8°F | Resp 16 | Ht 72.0 in | Wt 196.1 lb

## 2020-06-20 DIAGNOSIS — E782 Mixed hyperlipidemia: Secondary | ICD-10-CM | POA: Diagnosis not present

## 2020-06-20 DIAGNOSIS — I10 Essential (primary) hypertension: Secondary | ICD-10-CM

## 2020-06-20 DIAGNOSIS — E1169 Type 2 diabetes mellitus with other specified complication: Secondary | ICD-10-CM | POA: Diagnosis not present

## 2020-06-20 DIAGNOSIS — Z23 Encounter for immunization: Secondary | ICD-10-CM | POA: Diagnosis not present

## 2020-06-20 DIAGNOSIS — Z0000 Encounter for general adult medical examination without abnormal findings: Secondary | ICD-10-CM | POA: Diagnosis not present

## 2020-06-20 DIAGNOSIS — E538 Deficiency of other specified B group vitamins: Secondary | ICD-10-CM | POA: Diagnosis not present

## 2020-06-20 NOTE — Progress Notes (Signed)
Subjective:    Patient ID: Alex Church, male    DOB: Sep 21, 1945, 74 y.o.   MRN: [id number]  DOS:  06/20/2020 Type of visit - description: cpx  Here for CPX, other issues were discussed including DM, HTN, high cholesterol.  Also, few weeks ago had a fall while working on his yard, landed on the left side, developed pain around the left hip. Few days later he tripped and near fell, felt a "pop"  around the left hip and developed severe pain. Overall pain is much improved, still has some discomfort when he goes up stairs.   BP Readings from Last 3 Encounters:  06/20/20 (!) 152/77  12/12/19 (!) 159/63  05/06/19 130/90     Review of Systems  Other than above, a 14 point review of systems is negative    Past Medical History:  Diagnosis Date  . B12 deficiency    monthly shots  . Diabetes mellitus   . Glaucoma suspect   . Hyperlipemia   . Hypertension     Past Surgical History:  Procedure Laterality Date  . NO PAST SURGERIES      Allergies as of 06/20/2020   No Known Allergies     Medication List       Accurate as of June 20, 2020 11:59 PM. If you have any questions, ask your nurse or doctor.        allopurinol 100 MG tablet Commonly known as: ZYLOPRIM Take 100 mg by mouth 2 (two) times daily.   aspirin 81 MG tablet Take 81 mg by mouth daily.   atorvastatin 10 MG tablet Commonly known as: LIPITOR Take 1 tablet (10 mg total) by mouth at bedtime.   CALCIUM + D PO Take 1,000 mg by mouth daily.   carvedilol 12.5 MG tablet Commonly known as: COREG Take 1 tablet (12.5 mg total) by mouth 2 (two) times daily with a meal.   cloNIDine 0.2 MG tablet Commonly known as: CATAPRES Take 1 tablet (0.2 mg total) by mouth 2 (two) times daily.   colchicine 0.6 MG tablet Take 0.6 mg by mouth daily.   Fish Oil 1000 MG Caps Take by mouth.   Glucosamine-Chondroitin 500-400 MG Caps Take 3 capsules by mouth 2 (two) times daily.   glucose blood test strip Check  blood sugars once daily   Januvia 100 MG tablet Generic drug: sitaGLIPtin Take 1 tablet (100 mg total) by mouth daily.   lisinopril 20 MG tablet Commonly known as: ZESTRIL Take 1 tablet (20 mg total) by mouth 2 (two) times daily.   Magnesium Oxide 400 MG Caps Take 2 capsules (800 mg total) by mouth 2 (two) times daily.   meloxicam 7.5 MG tablet Commonly known as: MOBIC Take 1 tablet (7.5 mg total) by mouth daily as needed for pain.   metFORMIN 1000 MG tablet Commonly known as: GLUCOPHAGE TAKE 1 AND 1/2 TABLETS BY MOUTH EVERY MORNING AND 1 TABLET BY MOUTH EVERY EVENING   multivitamin tablet Take 1 tablet by mouth daily.   ONE TOUCH LANCETS Misc Check blood sugar once daily   OneTouch Verio Flex System w/Device Kit Check blood sugar once daily   pioglitazone 30 MG tablet Commonly known as: Actos Take 1 tablet (30 mg total) by mouth daily.          Objective:   Physical Exam BP (!) 152/77 (BP Location: Left Arm, Patient Position: Sitting, Cuff Size: Normal)   Pulse 82   Temp 97.8 F (36.6 C) (Oral)  Resp 16   Ht 6' (1.829 m)   Wt 196 lb 2 oz (89 kg)   SpO2 98%   BMI 26.60 kg/m  General: Well developed, NAD, BMI noted Neck: No  thyromegaly  HEENT:  Normocephalic . Face symmetric, atraumatic Lungs:  CTA B Normal respiratory effort, no intercostal retractions, no accessory muscle use. Heart: RRR,  no murmur.  Abdomen:  Not distended, soft, non-tender. No rebound or rigidity.   DM foot exam: No edema, good pedal pulses, pinprick examination normal, nails dystrophic MSK: Hip rotation bilateral normal, no TTP at the trochanteric bursas Skin: Exposed areas without rash. Not pale. Not jaundice DRE: Declined Neurologic:  alert & oriented X3.  Speech normal, gait appropriate for age and unassisted Strength symmetric and appropriate for age.  Psych: Cognition and judgment appear intact.  Cooperative with normal attention span and concentration.  Behavior  appropriate. No anxious or depressed appearing.     Assessment    Assessment (transfer from Dr Linna Darner 02-2016)  DM HTN Hyperlipidemia Frequent PVCs versus trigeminy (resolved after hypomagnesemia treatment) Hypomagnesemia: Not on PPIs on diuretics. 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 +FH CAD  PLAN: Here for CPX DM: Last A1c 7.2, I Rx Rybelsus however cost was an issue thus started Actos.  He also takes Januvia and Metformin.  Ambulatory CBGs varies depending on diet and activity.  We will check a A1c.  Foot exam normal HTN: BP today slightly abnormal, at home is consistently in the 120s, cont carvedilol, clonidine, Zestril, checking labs. Hyperlipidemia: On Lipitor, well controlled. Hypomagnesemia: On supplements, checking magnesium levels. Tophaceous gout: Sees rheumatology B12 deficiency: On oral supplements, checking labs Hip contusion: See HPI, he seems to be much better.  Fall precautions discussed RTC 6 months    This visit occurred during the SARS-CoV-2 public health emergency.  Safety protocols were in place, including screening questions prior to the visit, additional usage of staff PPE, and extensive cleaning of exam room while observing appropriate contact time as indicated for disinfecting solutions.

## 2020-06-20 NOTE — Progress Notes (Signed)
Pre visit review using our clinic review tool, if applicable. No additional management support is needed unless otherwise documented below in the visit note. 

## 2020-06-20 NOTE — Patient Instructions (Addendum)
Check the  blood pressure regularly  BP GOAL is between 110/65 and  135/85. If it is consistently higher or lower, let me know  Continue taking your B12 supplements and all your other medicines   GO TO THE LAB : Get the blood work     [place], [place] back for a checkup in 6 months

## 2020-06-21 ENCOUNTER — Encounter: Payer: Self-pay | Admitting: Internal Medicine

## 2020-06-21 LAB — COMPREHENSIVE METABOLIC PANEL
AG Ratio: 1.7 (calc) (ref 1.0–2.5)
ALT: 11 U/L (ref 9–46)
AST: 14 U/L (ref 10–35)
Albumin: 4.3 g/dL (ref 3.6–5.1)
Alkaline phosphatase (APISO): 69 U/L (ref 35–144)
BUN: 20 mg/dL (ref 7–25)
CO2: 28 mmol/L (ref 20–32)
Calcium: 9.6 mg/dL (ref 8.6–10.3)
Chloride: 99 mmol/L (ref 98–110)
Creat: 1.09 mg/dL (ref 0.70–1.18)
Globulin: 2.5 g/dL (calc) (ref 1.9–3.7)
Glucose, Bld: 108 mg/dL — ABNORMAL HIGH (ref 65–99)
Potassium: 4.7 mmol/L (ref 3.5–5.3)
Sodium: 137 mmol/L (ref 135–146)
Total Bilirubin: 0.4 mg/dL (ref 0.2–1.2)
Total Protein: 6.8 g/dL (ref 6.1–8.1)

## 2020-06-21 LAB — PSA: PSA: 1.57 ng/mL (ref <=4.0)

## 2020-06-21 LAB — MAGNESIUM: Magnesium: 1.1 mg/dL — ABNORMAL LOW (ref 1.5–2.5)

## 2020-06-21 LAB — HEMOGLOBIN A1C
Hgb A1c MFr Bld: 6.3 % of total Hgb — ABNORMAL HIGH (ref <5.7)
Mean Plasma Glucose: 134 (calc)
eAG (mmol/L): 7.4 (calc)

## 2020-06-21 LAB — VITAMIN B12: Vitamin B-12: 1979 pg/mL — ABNORMAL HIGH (ref 200–1100)

## 2020-06-21 NOTE — Assessment & Plan Note (Signed)
-  Tdap : got at the pharmacy last year (2020), no documentation  -pnm 23 2019 -pnm 13: 12/2016 - zostavax   @ [facility]: rec to get documentation; had a Shingrex vaccination?    - s/p Covid vax, rec booster  - flu shot today --JQB:HALPF had a scope, declines screenings -Prostate cancer screening: Declines DRE, request continue checking PSAs.  Asymptomatic. -: Doing great with lifestyle.  Active, trying to eat healthy.

## 2020-06-21 NOTE — Assessment & Plan Note (Addendum)
Here for CPX DM: Last A1c 7.2, I Rx Rybelsus however cost was an issue thus started Actos.  He also takes Januvia and Metformin.  Ambulatory CBGs varies depending on diet and activity.  We will check a A1c.  Foot exam normal HTN: BP today slightly abnormal, at home is consistently in the 120s, cont carvedilol, clonidine, Zestril, checking labs. Hyperlipidemia: On Lipitor, well controlled. Hypomagnesemia: On supplements, checking magnesium levels. Tophaceous gout: Sees rheumatology B12 deficiency: On oral supplements, checking labs Hip contusion: See HPI, he seems to be much better.  Fall precautions discussed RTC 6 months

## 2020-06-29 ENCOUNTER — Other Ambulatory Visit (HOSPITAL_COMMUNITY): Payer: Self-pay | Admitting: Physician Assistant

## 2020-07-31 ENCOUNTER — Other Ambulatory Visit: Payer: Self-pay | Admitting: Internal Medicine

## 2020-08-01 ENCOUNTER — Other Ambulatory Visit (HOSPITAL_COMMUNITY): Payer: Self-pay | Admitting: Physician Assistant

## 2020-08-09 DIAGNOSIS — M1A09X1 Idiopathic chronic gout, multiple sites, with tophus (tophi): Secondary | ICD-10-CM | POA: Diagnosis not present

## 2020-08-15 ENCOUNTER — Other Ambulatory Visit (HOSPITAL_COMMUNITY): Payer: Self-pay | Admitting: Physician Assistant

## 2020-08-16 ENCOUNTER — Ambulatory Visit: Payer: Self-pay | Admitting: *Deleted

## 2020-08-27 ENCOUNTER — Other Ambulatory Visit: Payer: Self-pay | Admitting: Internal Medicine

## 2020-09-05 ENCOUNTER — Other Ambulatory Visit: Payer: Self-pay | Admitting: Internal Medicine

## 2020-09-17 ENCOUNTER — Other Ambulatory Visit: Payer: Self-pay | Admitting: Internal Medicine

## 2020-09-20 ENCOUNTER — Ambulatory Visit (INDEPENDENT_AMBULATORY_CARE_PROVIDER_SITE_OTHER): Payer: Medicare Other

## 2020-09-20 ENCOUNTER — Other Ambulatory Visit: Payer: Self-pay

## 2020-09-20 VITALS — BP 128/70 | HR 70 | Temp 98.1°F | Resp 16 | Ht 72.0 in | Wt 197.6 lb

## 2020-09-20 DIAGNOSIS — Z0000 Encounter for general adult medical examination without abnormal findings: Secondary | ICD-10-CM | POA: Diagnosis not present

## 2020-09-20 NOTE — Patient Instructions (Signed)
Alex Church , Thank you for taking time to come for your Medicare Wellness Visit. I appreciate your ongoing commitment to your health goals. Please review the following plan we discussed and let me know if I can assist you in the future.   Screening recommendations/referrals: Colonoscopy: Declined Recommended yearly ophthalmology/optometry visit for glaucoma screening and checkup Recommended yearly dental visit for hygiene and checkup  Vaccinations: Influenza vaccine: Up to date Pneumococcal vaccine: Completed vaccines Tdap vaccine: Per our conversation , up to date. Please bring documentation for your chart. Shingles vaccine: Per our conversation , up to date. Please bring documentation for your chart. Covid-19: Completed vaccines  Advanced directives: Declined information today.  Conditions/risks identified: See problem list  Next appointment: Follow up in one year for your annual wellness visit. 09/26/2021 @ 2:40  Preventive Care 65 Years and Older, Male Preventive care refers to lifestyle choices and visits with your health care provider that can promote health and wellness. What does preventive care include?  A yearly physical exam. This is also called an annual well check.  Dental exams once or twice a year.  Routine eye exams. Ask your health care provider how often you should have your eyes checked.  Personal lifestyle choices, including:  Daily care of your teeth and gums.  Regular physical activity.  Eating a healthy diet.  Avoiding tobacco and drug use.  Limiting alcohol use.  Practicing safe sex.  Taking low doses of aspirin every day.  Taking vitamin and mineral supplements as recommended by your health care provider. What happens during an annual well check? The services and screenings done by your health care provider during your annual well check will depend on your age, overall health, lifestyle risk factors, and family history of disease. Counseling   Your health care provider may ask you questions about your:  Alcohol use.  Tobacco use.  Drug use.  Emotional well-being.  Home and relationship well-being.  Sexual activity.  Eating habits.  History of falls.  Memory and ability to understand (cognition).  Work and work Statistician. Screening  You may have the following tests or measurements:  Height, weight, and BMI.  Blood pressure.  Lipid and cholesterol levels. These may be checked every 5 years, or more frequently if you are over 74 years old.  Skin check.  Lung cancer screening. You may have this screening every year starting at age 25 if you have a 30-pack-year history of smoking and currently smoke or have quit within the past 15 years.  Fecal occult blood test (FOBT) of the stool. You may have this test every year starting at age 87.  Flexible sigmoidoscopy or colonoscopy. You may have a sigmoidoscopy every 5 years or a colonoscopy every 10 years starting at age 104.  Prostate cancer screening. Recommendations will vary depending on your family history and other risks.  Hepatitis C blood test.  Hepatitis B blood test.  Sexually transmitted disease (STD) testing.  Diabetes screening. This is done by checking your blood sugar (glucose) after you have not eaten for a while (fasting). You may have this done every 1-3 years.  Abdominal aortic aneurysm (AAA) screening. You may need this if you are a current or former smoker.  Osteoporosis. You may be screened starting at age 52 if you are at high risk. Talk with your health care provider about your test results, treatment options, and if necessary, the need for more tests. Vaccines  Your health care provider may recommend certain vaccines, such as:  Influenza vaccine. This is recommended every year.  Tetanus, diphtheria, and acellular pertussis (Tdap, Td) vaccine. You may need a Td booster every 10 years.  Zoster vaccine. You may need this after age  7.  Pneumococcal 13-valent conjugate (PCV13) vaccine. One dose is recommended after age 54.  Pneumococcal polysaccharide (PPSV23) vaccine. One dose is recommended after age 41. Talk to your health care provider about which screenings and vaccines you need and how often you need them. This information is not intended to replace advice given to you by your health care provider. Make sure you discuss any questions you have with your health care provider. Document Released: 09/21/2015 Document Revised: 05/14/2016 Document Reviewed: 06/26/2015 Elsevier Interactive Patient Education  2017 [employer] Prevention in the Home Falls can cause injuries. They can happen to people of all ages. There are many things you can do to make your home safe and to help prevent falls. What can I do on the outside of my home?  Regularly fix the edges of walkways and driveways and fix any cracks.  Remove anything that might make you trip as you walk through a door, such as a raised step or threshold.  Trim any bushes or trees on the path to your home.  Use bright outdoor lighting.  Clear any walking paths of anything that might make someone trip, such as rocks or tools.  Regularly check to see if handrails are loose or broken. Make sure that both sides of any steps have handrails.  Any raised decks and porches should have guardrails on the edges.  Have any leaves, snow, or ice cleared regularly.  Use sand or salt on walking paths during winter.  Clean up any spills in your garage right away. This includes oil or grease spills. What can I do in the bathroom?  Use night lights.  Install grab bars by the toilet and in the tub and shower. Do not use towel bars as grab bars.  Use non-skid mats or decals in the tub or shower.  If you need to sit down in the shower, use a plastic, non-slip stool.  Keep the floor dry. Clean up any water that spills on the floor as soon as it happens.  Remove  soap buildup in the tub or shower regularly.  Attach bath mats securely with double-sided non-slip rug tape.  Do not have throw rugs and other things on the floor that can make you trip. What can I do in the bedroom?  Use night lights.  Make sure that you have a light by your bed that is easy to reach.  Do not use any sheets or blankets that are too big for your bed. They should not hang down onto the floor.  Have a firm chair that has side arms. You can use this for support while you get dressed.  Do not have throw rugs and other things on the floor that can make you trip. What can I do in the kitchen?  Clean up any spills right away.  Avoid walking on wet floors.  Keep items that you use a lot in easy-to-reach places.  If you need to reach something above you, use a strong step stool that has a grab bar.  Keep electrical cords out of the way.  Do not use floor polish or wax that makes floors slippery. If you must use wax, use non-skid floor wax.  Do not have throw rugs and other things on the floor that  can make you trip. What can I do with my stairs?  Do not leave any items on the stairs.  Make sure that there are handrails on both sides of the stairs and use them. Fix handrails that are broken or loose. Make sure that handrails are as long as the stairways.  Check any carpeting to make sure that it is firmly attached to the stairs. Fix any carpet that is loose or worn.  Avoid having throw rugs at the top or bottom of the stairs. If you do have throw rugs, attach them to the floor with carpet tape.  Make sure that you have a light switch at the top of the stairs and the bottom of the stairs. If you do not have them, ask someone to add them for you. What else can I do to help prevent falls?  Wear shoes that:  Do not have high heels.  Have rubber bottoms.  Are comfortable and fit you well.  Are closed at the toe. Do not wear sandals.  If you use a  stepladder:  Make sure that it is fully opened. Do not climb a closed stepladder.  Make sure that both sides of the stepladder are locked into place.  Ask someone to hold it for you, if possible.  Clearly mark and make sure that you can see:  Any grab bars or handrails.  First and last steps.  Where the edge of each step is.  Use tools that help you move around (mobility aids) if they are needed. These include:  Canes.  Walkers.  Scooters.  Crutches.  Turn on the lights when you go into a dark area. Replace any light bulbs as soon as they burn out.  Set up your furniture so you have a clear path. Avoid moving your furniture around.  If any of your floors are uneven, fix them.  If there are any pets around you, be aware of where they are.  Review your medicines with your doctor. Some medicines can make you feel dizzy. This can increase your chance of falling. Ask your doctor what other things that you can do to help prevent falls. This information is not intended to replace advice given to you by your health care provider. Make sure you discuss any questions you have with your health care provider. Document Released: 06/21/2009 Document Revised: 01/31/2016 Document Reviewed: 09/29/2014 Elsevier Interactive Patient Education  2017 [employer].

## 2020-09-20 NOTE — Progress Notes (Signed)
Subjective:   BREVON DEWALD is a 75 y.o. male who presents for Medicare Annual/Subsequent preventive examination.   Review of Systems     Cardiac Risk Factors include: advanced age (>33mn, >>67women);male gender;dyslipidemia;diabetes mellitus;hypertension     Objective:    Today's Vitals   09/20/20 1415  BP: 128/70  Pulse: 70  Resp: 16  Temp: 98.1 F (36.7 C)  TempSrc: Oral  SpO2: 96%  Weight: 197 lb 9.6 oz (89.6 kg)  Height: 6' (1.829 m)   Body mass index is 26.8 kg/m.  Advanced Directives 09/20/2020 08/15/2019 08/02/2018  Does Patient Have a Medical Advance Directive? No No Yes  Type of Advance Directive - -Public librarianLiving will  Copy of HL[place]in Chart? - - No - copy requested  Would patient like information on creating a medical advance directive? No - Patient declined No - Patient declined -    Current Medications (verified) Outpatient Encounter Medications as of 09/20/2020  Medication Sig  . allopurinol (ZYLOPRIM) 100 MG tablet Take 100 mg by mouth 2 (two) times daily.  .Marland Kitchenaspirin 81 MG tablet Take 81 mg by mouth daily.  .Marland Kitchenatorvastatin (LIPITOR) 10 MG tablet Take 1 tablet (10 mg total) by mouth at bedtime.  . Blood Glucose Monitoring Suppl (OHarris w/Device KIT Check blood sugar once daily  . Calcium Carbonate-Vitamin D (CALCIUM + D PO) Take 1,000 mg by mouth daily.  . carvedilol (COREG) 12.5 MG tablet TAKE 1 TABLET (12.5 MG TOTAL) BY MOUTH 2 (TWO) TIMES DAILY WITH A MEAL.  . cloNIDine (CATAPRES) 0.2 MG tablet Take 1 tablet (0.2 mg total) by mouth 2 (two) times daily.  . colchicine 0.6 MG tablet Take 0.6 mg by mouth daily.  . Glucosamine-Chondroitin 500-400 MG CAPS Take 3 capsules by mouth 2 (two) times daily.  .Marland Kitchenglucose blood test strip Check blood sugars once daily  . JANUVIA 100 MG tablet Take 1 tablet (100 mg total) by mouth daily.  .Marland Kitchenlisinopril (ZESTRIL) 20 MG tablet Take 1 tablet (20 mg total) by  mouth 2 (two) times daily.  . Magnesium Oxide 400 MG CAPS Take 2 capsules (800 mg total) by mouth 2 (two) times daily.  . meloxicam (MOBIC) 7.5 MG tablet Take 1 tablet (7.5 mg total) by mouth daily as needed for pain.  . metFORMIN (GLUCOPHAGE) 1000 MG tablet TAKE 1 AND 1/2 TABLETS BY MOUTH EVERY MORNING AND 1 TABLET BY MOUTH EVERY EVENING  . Multiple Vitamin (MULTIVITAMIN) tablet Take 1 tablet by mouth daily.  . Omega-3 Fatty Acids (FISH OIL) 1000 MG CAPS Take by mouth.  . ONE TOUCH LANCETS MISC Check blood sugar once daily  . pioglitazone (ACTOS) 30 MG tablet Take 1 tablet (30 mg total) by mouth daily.   No facility-administered encounter medications on file as of 09/20/2020.    Allergies (verified) Patient has no known allergies.   History: Past Medical History:  Diagnosis Date  . B12 deficiency    monthly shots  . Diabetes mellitus   . Glaucoma suspect   . Hyperlipemia   . Hypertension    Past Surgical History:  Procedure Laterality Date  . NO PAST SURGERIES     Family History  Problem Relation Age of Onset  . Diabetes Maternal Uncle   . Heart attack Brother 455 . Cancer Mother        intra-abdominal  . Cirrhosis Cousin        maternal  .  Diabetes Cousin        maternal  . Stroke Neg Hx   . Colon cancer Neg Hx   . Prostate cancer Neg Hx    Social History   Socioeconomic History  . Marital status: Married    Spouse name: Not on file  . Number of children: 0  . Years of education: Not on file  . Highest education level: Not on file  Occupational History  . Occupation: retired from [employer], works for a lab driving  Tobacco Use  . Smoking status: Never Smoker  . Smokeless tobacco: Never Used  Substance and Sexual Activity  . Alcohol use: No  . Drug use: No  . Sexual activity: Not on file  Other Topics Concern  . Not on file  Social History Narrative   Married (2nd marriage)   Lives w/ wife and 1 dog    Social Determinants of Health   Financial Resource  Strain: Low Risk   . Difficulty of Paying Living Expenses: Not hard at all  Food Insecurity: No Food Insecurity  . Worried About Charity fundraiser in the Last Year: Never true  . Ran Out of Food in the Last Year: Never true  Transportation Needs: No Transportation Needs  . Lack of Transportation (Medical): No  . Lack of Transportation (Non-Medical): No  Physical Activity: Inactive  . Days of Exercise per Week: 0 days  . Minutes of Exercise per Session: 0 min  Stress: No Stress Concern Present  . Feeling of Stress : Not at all  Social Connections: Moderately Isolated  . Frequency of Communication with Friends and Family: More than three times a week  . Frequency of Social Gatherings with Friends and Family: More than three times a week  . Attends Religious Services: Never  . Active Member of Clubs or Organizations: No  . Attends Archivist Meetings: Never  . Marital Status: Married    Tobacco Counseling Counseling given: Not Answered   Clinical Intake:  Pre-visit preparation completed: Yes  Pain : No/denies pain     Nutritional Status: BMI 25 -29 Overweight Nutritional Risks: None Diabetes: Yes CBG done?: No Did pt. bring in CBG monitor from home?: No  How often do you need to have someone help you when you read instructions, pamphlets, or other written materials from your doctor or pharmacy?: 1 - Never  Diabetes:  Is the patient diabetic?  Yes  If diabetic, was a CBG obtained today?  No  Did the patient bring in their glucometer from home?  No  How often do you monitor your CBG's? daily.   Financial Strains and Diabetes Management:  Are you having any financial strains with the device, your supplies or your medication? No .  Does the patient want to be seen by Chronic Care Management for management of their diabetes?  No  Would the patient like to be referred to a Nutritionist or for Diabetic Management?  No   Diabetic Exams:  Diabetic Eye Exam:  Completed 04/16/2020.   Diabetic Foot Exam: Completed 06/20/2020.    Interpreter Needed?: No  Information entered by :: Caroleen Hamman LPn   Activities of Daily Living   Patient Care Team: Colon Branch, MD as PCP - General (Internal Medicine) Monna Fam, MD as Consulting Physician (Ophthalmology) Prudencio Pair as Physician Assistant (Rheumatology)  Indicate any recent Medical Services you may have received from other than [facility] providers in the past year (date may be approximate).  Assessment:   This is a routine wellness examination for Alson.  Hearing/Vision screen  Hearing Screening   _0  _1  _2  _3  _4  _5  _6  _7  _8   Right ear:           Left ear:           Comments: No issues  Vision Screening Comments: Wears glasses Last eye exam-04/2020-Dr. Kathlen Mody  Dietary issues and exercise activities discussed: Current Exercise Habits: Home exercise routine;The patient does not participate in regular exercise at present, Exercise limited by: None identified  Goals    . Maintain healthy active lifestyle.    . Patient Stated     Start doing some strength training      Depression Screen PHQ 2/9 Scores 09/20/2020 08/15/2019 05/06/2019 08/02/2018 01/25/2018 06/30/2016 02/25/2016  PHQ - 2 Score 0 0 0 0 0 0 0    Fall Risk Fall Risk  09/20/2020 08/15/2019 07/29/2019 05/06/2019 08/02/2018  Falls in the past year? 0 0 0 0 0  Comment - - Emmi Telephone Survey: data to providers prior to load - -  Number falls in past yr: 1 - - - -  Injury with Fall? 0 - - - -  Risk for fall due to : History of fall(s) - - - -  Follow up Falls prevention discussed Education provided;Falls prevention discussed - Falls evaluation completed -    FALL RISK PREVENTION PERTAINING TO THE HOME:  Any stairs in or around the home? Yes  If so, are there any without handrails? No  Home free of loose throw rugs in walkways, pet beds, electrical cords, etc? Yes  Adequate  lighting in your home to reduce risk of falls? Yes   ASSISTIVE DEVICES UTILIZED TO PREVENT FALLS:  Life alert? No  Use of a cane, walker or w/c? No  Grab bars in the bathroom? No  Shower chair or bench in shower? No  Elevated toilet seat or a handicapped toilet? No   TIMED UP AND GO:  Was the test performed? Yes .  Length of time to ambulate 10 feet: 9 sec.   Gait steady and fast without use of assistive device  Cognitive Function:Normal cognitive status assessed by direct observation by this Nurse Health Advisor. No abnormalities found.          Immunizations Immunization History  Administered Date(s) Administered  . Fluad Quad(high Dose 65+) 07/07/2019, 06/20/2020  . Influenza Split 06/04/2012  . Influenza, High Dose Seasonal PF 06/27/2015, 06/30/2016, 06/26/2017, 07/02/2018  . Influenza,inj,Quad PF,6+ Mos 06/17/2013, 06/23/2014  . PFIZER SARS-COV-2 Vaccination 09/23/2019, 10/14/2019, 07/13/2020  . Pneumococcal Conjugate-13 12/19/2016  . Pneumococcal Polysaccharide-23 01/25/2018  . Td 09/08/2008  . Zoster 03/18/2013    TDAP status: Up to date per patient. He is going to bring documentation to his next office visit.  Flu Vaccine status: Up to date  Pneumococcal vaccine status: Up to date  Covid-19 vaccine status: Completed vaccines  Qualifies for Shingles Vaccine? No   Zostavax completed Yes   Shingrix Completed?: Yes per patient. He is going to bring documentation to his next office visit.   Screening Tests Health Maintenance  Topic Date Due  . TETANUS/TDAP  09/08/2018  . COLONOSCOPY (Pts 45-79yr Insurance coverage will need to be confirmed)  06/20/2021 (Originally 05/17/1991)  . HEMOGLOBIN A1C  12/19/2020  . OPHTHALMOLOGY EXAM  04/16/2021  . FOOT EXAM  06/20/2021  . INFLUENZA VACCINE  Completed  . COVID-19 Vaccine  Completed  . Hepatitis C Screening  Completed  .  PNA vac Low Risk Adult  Completed    Health Maintenance  Health Maintenance Due  Topic  Date Due  . TETANUS/TDAP  09/08/2018    Colorectal cancer screening: Due-Patient declined  Lung Cancer Screening: (Low Dose CT Chest recommended if Age 45-80 years, 30 pack-year currently smoking OR have quit w/in 15years.) does not qualify.    Additional Screening:  Hepatitis C Screening: Completed 12/19/2016  Vision Screening: Recommended annual ophthalmology exams for early detection of glaucoma and other disorders of the eye. Is the patient up to date with their annual eye exam?  Yes  Who is the provider or what is the name of the office in which the patient attends annual eye exams? Dr. Kathlen Mody   Dental Screening: Recommended annual dental exams for proper oral hygiene  Community Resource Referral / Chronic Care Management: CRR required this visit?  No   CCM required this visit?  No      Plan:     I have personally reviewed and noted the following in the patient's chart:   . Medical and social history . Use of alcohol, tobacco or illicit drugs  . Current medications and supplements . Functional ability and status . Nutritional status . Physical activity . Advanced directives . List of other physicians . Hospitalizations, surgeries, and ER visits in previous 12 months . Vitals . Screenings to include cognitive, depression, and falls . Referrals and appointments  In addition, I have reviewed and discussed with patient certain preventive protocols, quality metrics, and best practice recommendations. A written personalized care plan for preventive services as well as general preventive health recommendations were provided to patient.   Patient to access avs on mychart  Marta Antu, LPN   [id number]   Nurse Notes: None

## 2020-10-09 ENCOUNTER — Other Ambulatory Visit: Payer: Self-pay | Admitting: Internal Medicine

## 2020-10-24 ENCOUNTER — Other Ambulatory Visit: Payer: Self-pay | Admitting: Internal Medicine

## 2020-10-31 DIAGNOSIS — Z6828 Body mass index (BMI) 28.0-28.9, adult: Secondary | ICD-10-CM | POA: Diagnosis not present

## 2020-10-31 DIAGNOSIS — M1A09X1 Idiopathic chronic gout, multiple sites, with tophus (tophi): Secondary | ICD-10-CM | POA: Diagnosis not present

## 2020-10-31 DIAGNOSIS — E663 Overweight: Secondary | ICD-10-CM | POA: Diagnosis not present

## 2020-11-12 ENCOUNTER — Other Ambulatory Visit: Payer: Self-pay | Admitting: Internal Medicine

## 2020-11-23 ENCOUNTER — Encounter: Payer: Self-pay | Admitting: Internal Medicine

## 2020-11-23 ENCOUNTER — Other Ambulatory Visit: Payer: Self-pay | Admitting: Internal Medicine

## 2020-11-23 MED ORDER — JANUVIA 100 MG PO TABS: 100.0000 mg | ORAL_TABLET | Freq: Every day | ORAL | 0 refills | Status: DC

## 2020-11-23 MED ORDER — PIOGLITAZONE HCL 30 MG PO TABS: 30.0000 mg | ORAL_TABLET | Freq: Every day | ORAL | 0 refills | Status: DC

## 2020-12-10 ENCOUNTER — Other Ambulatory Visit (HOSPITAL_COMMUNITY): Payer: Self-pay

## 2020-12-10 MED FILL — Meloxicam Tab 7.5 MG: ORAL | 30 days supply | Qty: 30 | Fill #0 | Status: AC

## 2020-12-16 ENCOUNTER — Other Ambulatory Visit: Payer: Self-pay | Admitting: Internal Medicine

## 2020-12-16 MED FILL — Atorvastatin Calcium Tab 10 MG (Base Equivalent): ORAL | 90 days supply | Qty: 90 | Fill #0 | Status: AC

## 2020-12-17 ENCOUNTER — Other Ambulatory Visit (HOSPITAL_COMMUNITY): Payer: Self-pay

## 2020-12-17 MED ORDER — METFORMIN HCL 1000 MG PO TABS
ORAL_TABLET | ORAL | 0 refills | Status: DC
  Filled 2020-12-17: qty 75, 30d supply, fill #0

## 2020-12-30 ENCOUNTER — Other Ambulatory Visit: Payer: Self-pay | Admitting: Internal Medicine

## 2020-12-31 ENCOUNTER — Other Ambulatory Visit (HOSPITAL_COMMUNITY): Payer: Self-pay

## 2020-12-31 ENCOUNTER — Other Ambulatory Visit: Payer: Self-pay

## 2020-12-31 MED ORDER — JANUVIA 100 MG PO TABS
100.0000 mg | ORAL_TABLET | Freq: Every day | ORAL | 0 refills | Status: DC
  Filled 2020-12-31: qty 30, 30d supply, fill #0

## 2020-12-31 MED ORDER — PIOGLITAZONE HCL 30 MG PO TABS
30.0000 mg | ORAL_TABLET | Freq: Three times a day (TID) | ORAL | 0 refills | Status: DC | PRN
  Filled 2020-12-31: qty 30, 10d supply, fill #0

## 2020-12-31 MED ORDER — PIOGLITAZONE HCL 30 MG PO TABS
30.0000 mg | ORAL_TABLET | Freq: Every day | ORAL | 0 refills | Status: DC
  Filled 2020-12-31: qty 30, 30d supply, fill #0

## 2021-01-11 ENCOUNTER — Encounter: Payer: Self-pay | Admitting: Internal Medicine

## 2021-01-11 ENCOUNTER — Other Ambulatory Visit: Payer: Self-pay

## 2021-01-11 ENCOUNTER — Ambulatory Visit (INDEPENDENT_AMBULATORY_CARE_PROVIDER_SITE_OTHER): Payer: Medicare Other | Admitting: Internal Medicine

## 2021-01-11 VITALS — BP 126/82 | HR 67 | Temp 98.0°F | Resp 16 | Ht 72.0 in | Wt 196.2 lb

## 2021-01-11 DIAGNOSIS — E782 Mixed hyperlipidemia: Secondary | ICD-10-CM | POA: Diagnosis not present

## 2021-01-11 DIAGNOSIS — E1169 Type 2 diabetes mellitus with other specified complication: Secondary | ICD-10-CM

## 2021-01-11 DIAGNOSIS — I10 Essential (primary) hypertension: Secondary | ICD-10-CM

## 2021-01-11 DIAGNOSIS — Z7185 Encounter for immunization safety counseling: Secondary | ICD-10-CM

## 2021-01-11 DIAGNOSIS — M1A9XX1 Chronic gout, unspecified, with tophus (tophi): Secondary | ICD-10-CM

## 2021-01-11 LAB — HEMOGLOBIN A1C: Hgb A1c MFr Bld: 6.7 % — ABNORMAL HIGH (ref 4.6–6.5)

## 2021-01-11 LAB — CBC WITH DIFFERENTIAL/PLATELET
Basophils Absolute: 0.1 10*3/uL (ref 0.0–0.1)
Basophils Relative: 0.9 % (ref 0.0–3.0)
Eosinophils Absolute: 0.3 10*3/uL (ref 0.0–0.7)
Eosinophils Relative: 3.4 % (ref 0.0–5.0)
HCT: 37.5 % — ABNORMAL LOW (ref 39.0–52.0)
Hemoglobin: 12.5 g/dL — ABNORMAL LOW (ref 13.0–17.0)
Lymphocytes Relative: 22.8 % (ref 12.0–46.0)
Lymphs Abs: 1.9 10*3/uL (ref 0.7–4.0)
MCHC: 33.4 g/dL (ref 30.0–36.0)
MCV: 92.2 fl (ref 78.0–100.0)
Monocytes Absolute: 0.6 10*3/uL (ref 0.1–1.0)
Monocytes Relative: 6.9 % (ref 3.0–12.0)
Neutro Abs: 5.4 10*3/uL (ref 1.4–7.7)
Neutrophils Relative %: 66 % (ref 43.0–77.0)
Platelets: 173 10*3/uL (ref 150.0–400.0)
RBC: 4.07 Mil/uL — ABNORMAL LOW (ref 4.22–5.81)
RDW: 15.2 % (ref 11.5–15.5)
WBC: 8.1 10*3/uL (ref 4.0–10.5)

## 2021-01-11 LAB — LIPID PANEL
Cholesterol: 113 mg/dL (ref 0–200)
HDL: 40.4 mg/dL (ref >39.00)
LDL Cholesterol: 46 mg/dL (ref 0–99)
NonHDL: 72.22
Total CHOL/HDL Ratio: 3
Triglycerides: 130 mg/dL (ref 0.0–149.0)
VLDL: 26 mg/dL (ref 0.0–40.0)

## 2021-01-11 LAB — BASIC METABOLIC PANEL
BUN: 27 mg/dL — ABNORMAL HIGH (ref 6–23)
CO2: 31 mEq/L (ref 19–32)
Calcium: 9.8 mg/dL (ref 8.4–10.5)
Chloride: 100 mEq/L (ref 96–112)
Creatinine, Ser: 1.14 mg/dL (ref 0.40–1.50)
GFR: 63.29 mL/min (ref >60.00)
Glucose, Bld: 135 mg/dL — ABNORMAL HIGH (ref 70–99)
Potassium: 5 mEq/L (ref 3.5–5.1)
Sodium: 138 mEq/L (ref 135–145)

## 2021-01-11 LAB — MAGNESIUM: Magnesium: 2.1 mg/dL (ref 1.5–2.5)

## 2021-01-11 NOTE — Progress Notes (Signed)
Subjective:    Patient ID: Alex Church, male    DOB: 01-03-1946, 75 y.o.   MRN: [id number]  DOS:  01/11/2021 Type of visit - description: Routine office visit Since the last office visit is doing well. Today with talk about hypertension, diabetes, aspirin. Good med compliance. 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 No anxiety or depression   Review of Systems See above   Past Medical History:  Diagnosis Date  . B12 deficiency    monthly shots  . Diabetes mellitus   . Glaucoma suspect   . Hyperlipemia   . Hypertension     Past Surgical History:  Procedure Laterality Date  . NO PAST SURGERIES      Allergies as of 01/11/2021   No Known Allergies     Medication List       Accurate as of Jan 11, 2021 11:59 PM. If you have any questions, ask your nurse or doctor.        STOP taking these medications   aspirin 81 MG tablet Stopped by: Kathlene November, MD     TAKE these medications   allopurinol 100 MG tablet Commonly known as: ZYLOPRIM Take 100 mg by mouth 2 (two) times daily. What changed: Another medication with the same name was removed. Continue taking this medication, and follow the directions you see here. Changed by: Kathlene November, MD   atorvastatin 10 MG tablet Commonly known as: LIPITOR TAKE 1 TABLET (10 MG TOTAL) BY MOUTH AT BEDTIME.   CALCIUM + D PO Take 1,000 mg by mouth daily.   carvedilol 12.5 MG tablet Commonly known as: COREG TAKE 1 TABLET (12.5 MG TOTAL) BY MOUTH 2 (TWO) TIMES DAILY WITH A MEAL.   cloNIDine 0.2 MG tablet Commonly known as: CATAPRES TAKE 1 TABLET (0.2 MG TOTAL) BY MOUTH 2 (TWO) TIMES DAILY.   colchicine 0.6 MG tablet TAKE 1 TO 2 TABLETS BY MOUTH ONCE A DAY TO TO PREVENT GOUT FLARES What changed: Another medication with the same name was removed. Continue taking this medication, and follow the directions you see here. Changed by: Kathlene November, MD   Fish Oil 1000 MG Caps Take by mouth.    Glucosamine-Chondroitin 500-400 MG Caps Take 3 capsules by mouth 2 (two) times daily.   glucose blood test strip Check blood sugars once daily   Januvia 100 MG tablet Generic drug: sitaGLIPtin Take 1 tablet (100 mg total) by mouth daily.   lisinopril 20 MG tablet Commonly known as: ZESTRIL TAKE 1 TABLET (20 MG TOTAL) BY MOUTH 2 (TWO) TIMES DAILY.   Magnesium Oxide 400 MG Caps Take 2 capsules (800 mg total) by mouth 2 (two) times daily.   meloxicam 7.5 MG tablet Commonly known as: MOBIC TAKE 1 TABLET (7.5 MG TOTAL) BY MOUTH DAILY AS NEEDED FOR PAIN.   metFORMIN 1000 MG tablet Commonly known as: GLUCOPHAGE Take 1.5 tablets (1,500 mg total) by mouth in the morning AND 1 tablet (1,000 mg total) every evening.   multivitamin tablet Take 1 tablet by mouth daily.   ONE TOUCH LANCETS Misc Check blood sugar once daily   OneTouch Verio Flex System w/Device Kit Check blood sugar once daily   pioglitazone 30 MG tablet Commonly known as: ACTOS Take 1 tablet (30 mg total) by mouth daily.          Objective:   Physical Exam BP 126/82 (BP Location: Left Arm, Patient Position: Sitting, Cuff Size: Small)   Pulse  67   Temp 98 F (36.7 C) (Oral)   Resp 16   Ht 6' (1.829 m)   Wt 196 lb 4 oz (89 kg)   SpO2 97%   BMI 26.62 kg/m  General:   Well developed, NAD, BMI noted. HEENT:  Normocephalic . Face symmetric, atraumatic Lungs:  CTA B Normal respiratory effort, no intercostal retractions, no accessory muscle use. Heart: RRR,  no murmur.  Lower extremities: no pretibial edema bilaterally  Skin: Multiple skin lesions throughout Neurologic:  alert & oriented X3.  Speech normal, gait appropriate for age and unassisted Psych--  Cognition and judgment appear intact.  Cooperative with normal attention span and concentration.  Behavior appropriate. No anxious or depressed appearing.      Assessment     Assessment (transfer from Dr Linna Darner 02-2016)   DM HTN Hyperlipidemia Frequent PVCs versus trigeminy (resolved after hypomagnesemia treatment) Hypomagnesemia: Not on PPIs on diuretics. 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 +FH CAD  PLAN: DM: Ambulatory CBGs in the morning are slightly up in the 160s, in the afternoon typically very good in the 100s.  Continue Januvia, Actos, metformin, check A1c. HTN: Reports normal ambulatory BPs, continue carvedilol, clonidine, Zestril, check as BMP and CBC Hypomagnesemia: On supplements, checking labs B12 deficiency: Reports good compliance with oral supplements Tophaceous gout: Rheumatology release him, continue allopurinol, PCP to refill now. Aspirin: Okay to stop per recent literature Meloxicam: Takes very rarely. Vaccines, preventive care: rec covid vax #4 Rec to bring his healthcare power of attorney RTC 5 months CPX   This visit occurred during the SARS-CoV-2 public health emergency.  Safety protocols were in place, including screening questions prior to the visit, additional usage of staff PPE, and extensive cleaning of exam room while observing appropriate contact time as indicated for disinfecting solutions.

## 2021-01-11 NOTE — Patient Instructions (Addendum)
Please proceed with your COVID-vaccine booster  You can stop taking aspirin  Check the  blood pressure  BP GOAL is between 110/65 and  135/85. If it is consistently higher or lower, let me know  Continue checking your blood sugars   GO TO THE LAB : Get the blood work     [place], [place] back for a physical exam in 5 months

## 2021-01-12 NOTE — Assessment & Plan Note (Signed)
DM: Ambulatory CBGs in the morning are slightly up in the 160s, in the afternoon typically very good in the 100s.  Continue Januvia, Actos, metformin, check A1c. HTN: Reports normal ambulatory BPs, continue carvedilol, clonidine, Zestril, check as BMP and CBC Hypomagnesemia: On supplements, checking labs B12 deficiency: Reports good compliance with oral supplements Tophaceous gout: Rheumatology release him, continue allopurinol, PCP to refill now. Aspirin: Okay to stop per recent literature Meloxicam: Takes very rarely. Vaccines, preventive care: rec covid vax #4 Rec to bring his healthcare power of attorney RTC 5 months CPX

## 2021-01-13 ENCOUNTER — Other Ambulatory Visit: Payer: Self-pay | Admitting: Internal Medicine

## 2021-01-13 MED FILL — Lisinopril Tab 20 MG: ORAL | 90 days supply | Qty: 180 | Fill #0 | Status: AC

## 2021-01-14 ENCOUNTER — Other Ambulatory Visit (HOSPITAL_COMMUNITY): Payer: Self-pay

## 2021-01-14 MED ORDER — METFORMIN HCL 1000 MG PO TABS
ORAL_TABLET | ORAL | 1 refills | Status: DC
  Filled 2021-01-14: qty 225, 90d supply, fill #0
  Filled 2021-05-07: qty 225, 90d supply, fill #1
  Filled 2021-05-08: qty 225, 90d supply, fill #0

## 2021-01-14 NOTE — Telephone Encounter (Signed)
Yes, thank you.

## 2021-01-14 NOTE — Telephone Encounter (Signed)
Rx sent 

## 2021-01-14 NOTE — Telephone Encounter (Signed)
Refill request for metformin 1000mg - A1c on 01/11/2021 was 6.7. Okay to refill?

## 2021-01-23 ENCOUNTER — Other Ambulatory Visit: Payer: Self-pay | Admitting: Internal Medicine

## 2021-01-24 ENCOUNTER — Other Ambulatory Visit (HOSPITAL_COMMUNITY): Payer: Self-pay

## 2021-01-24 MED ORDER — PIOGLITAZONE HCL 30 MG PO TABS
30.0000 mg | ORAL_TABLET | Freq: Every day | ORAL | 1 refills | Status: DC
  Filled 2021-01-24: qty 90, 90d supply, fill #0
  Filled 2021-04-23: qty 90, 90d supply, fill #1
  Filled 2021-04-25: qty 90, 90d supply, fill #0

## 2021-01-24 MED ORDER — JANUVIA 100 MG PO TABS
100.0000 mg | ORAL_TABLET | Freq: Every day | ORAL | 1 refills | Status: DC
  Filled 2021-01-24: qty 30, 30d supply, fill #0
  Filled 2021-02-24: qty 30, 30d supply, fill #1
  Filled 2021-03-26 – 2021-03-28 (×3): qty 30, 30d supply, fill #2
  Filled 2021-04-28: qty 30, 30d supply, fill #3
  Filled 2021-04-29: qty 30, 30d supply, fill #0
  Filled 2021-05-26: qty 30, 30d supply, fill #1
  Filled 2021-06-30: qty 30, 30d supply, fill #2

## 2021-01-25 ENCOUNTER — Other Ambulatory Visit (HOSPITAL_COMMUNITY): Payer: Self-pay

## 2021-02-05 ENCOUNTER — Other Ambulatory Visit (HOSPITAL_COMMUNITY): Payer: Self-pay

## 2021-02-05 ENCOUNTER — Other Ambulatory Visit: Payer: Self-pay | Admitting: Internal Medicine

## 2021-02-05 MED ORDER — MELOXICAM 7.5 MG PO TABS
ORAL_TABLET | ORAL | 1 refills | Status: AC
  Filled 2021-02-05: qty 30, 30d supply, fill #0
  Filled 2021-04-23: qty 30, 30d supply, fill #1
  Filled 2021-04-25: qty 30, 30d supply, fill #0

## 2021-02-05 MED ORDER — CARVEDILOL 12.5 MG PO TABS
ORAL_TABLET | Freq: Two times a day (BID) | ORAL | 1 refills | Status: DC
  Filled 2021-02-05: qty 180, 90d supply, fill #0
  Filled 2021-04-28: qty 180, 90d supply, fill #1
  Filled 2021-04-29: qty 180, 90d supply, fill #0

## 2021-02-11 ENCOUNTER — Other Ambulatory Visit: Payer: Self-pay | Admitting: Internal Medicine

## 2021-02-11 ENCOUNTER — Other Ambulatory Visit (HOSPITAL_COMMUNITY): Payer: Self-pay

## 2021-02-11 MED ORDER — ALLOPURINOL 100 MG PO TABS
100.0000 mg | ORAL_TABLET | Freq: Two times a day (BID) | ORAL | 1 refills | Status: DC
  Filled 2021-02-11: qty 180, 90d supply, fill #0
  Filled 2021-05-09: qty 180, 90d supply, fill #1
  Filled 2021-05-10: qty 180, 90d supply, fill #0

## 2021-02-25 ENCOUNTER — Other Ambulatory Visit (HOSPITAL_COMMUNITY): Payer: Self-pay

## 2021-03-11 MED FILL — Clonidine HCl Tab 0.2 MG: ORAL | 90 days supply | Qty: 180 | Fill #0 | Status: AC

## 2021-03-12 ENCOUNTER — Other Ambulatory Visit (HOSPITAL_COMMUNITY): Payer: Self-pay

## 2021-03-13 ENCOUNTER — Other Ambulatory Visit (HOSPITAL_COMMUNITY): Payer: Self-pay

## 2021-03-19 ENCOUNTER — Other Ambulatory Visit: Payer: Self-pay | Admitting: Internal Medicine

## 2021-03-20 ENCOUNTER — Other Ambulatory Visit (HOSPITAL_COMMUNITY): Payer: Self-pay

## 2021-03-20 MED ORDER — ATORVASTATIN CALCIUM 10 MG PO TABS
10.0000 mg | ORAL_TABLET | Freq: Every day | ORAL | 1 refills | Status: DC
  Filled 2021-03-20: qty 90, 90d supply, fill #0
  Filled 2021-06-11 – 2021-06-12 (×2): qty 90, 90d supply, fill #1

## 2021-03-28 ENCOUNTER — Other Ambulatory Visit (HOSPITAL_COMMUNITY): Payer: Self-pay

## 2021-04-03 ENCOUNTER — Other Ambulatory Visit (HOSPITAL_COMMUNITY): Payer: Self-pay

## 2021-04-04 ENCOUNTER — Other Ambulatory Visit (HOSPITAL_COMMUNITY): Payer: Self-pay

## 2021-04-14 ENCOUNTER — Other Ambulatory Visit: Payer: Self-pay | Admitting: Internal Medicine

## 2021-04-15 ENCOUNTER — Other Ambulatory Visit (HOSPITAL_COMMUNITY): Payer: Self-pay

## 2021-04-15 MED ORDER — LISINOPRIL 20 MG PO TABS
20.0000 mg | ORAL_TABLET | Freq: Two times a day (BID) | ORAL | 1 refills | Status: DC
  Filled 2021-04-15: qty 180, 90d supply, fill #0
  Filled 2021-07-14: qty 180, 90d supply, fill #1

## 2021-04-25 ENCOUNTER — Other Ambulatory Visit (HOSPITAL_COMMUNITY): Payer: Self-pay

## 2021-04-29 ENCOUNTER — Other Ambulatory Visit (HOSPITAL_COMMUNITY): Payer: Self-pay

## 2021-05-08 ENCOUNTER — Other Ambulatory Visit (HOSPITAL_COMMUNITY): Payer: Self-pay

## 2021-05-10 ENCOUNTER — Other Ambulatory Visit (HOSPITAL_COMMUNITY): Payer: Self-pay

## 2021-05-16 ENCOUNTER — Encounter: Payer: Self-pay | Admitting: Internal Medicine

## 2021-05-16 DIAGNOSIS — H40013 Open angle with borderline findings, low risk, bilateral: Secondary | ICD-10-CM | POA: Diagnosis not present

## 2021-05-16 DIAGNOSIS — E119 Type 2 diabetes mellitus without complications: Secondary | ICD-10-CM | POA: Diagnosis not present

## 2021-05-16 DIAGNOSIS — H25013 Cortical age-related cataract, bilateral: Secondary | ICD-10-CM | POA: Diagnosis not present

## 2021-05-16 DIAGNOSIS — H2513 Age-related nuclear cataract, bilateral: Secondary | ICD-10-CM | POA: Diagnosis not present

## 2021-05-16 LAB — HM DIABETES EYE EXAM

## 2021-05-27 ENCOUNTER — Other Ambulatory Visit (HOSPITAL_COMMUNITY): Payer: Self-pay

## 2021-06-12 ENCOUNTER — Other Ambulatory Visit (HOSPITAL_COMMUNITY): Payer: Self-pay

## 2021-07-01 ENCOUNTER — Other Ambulatory Visit (HOSPITAL_COMMUNITY): Payer: Self-pay

## 2021-07-15 ENCOUNTER — Other Ambulatory Visit (HOSPITAL_COMMUNITY): Payer: Self-pay

## 2021-07-28 ENCOUNTER — Other Ambulatory Visit: Payer: Self-pay | Admitting: Internal Medicine

## 2021-07-29 ENCOUNTER — Other Ambulatory Visit (HOSPITAL_COMMUNITY): Payer: Self-pay

## 2021-07-29 MED ORDER — CARVEDILOL 12.5 MG PO TABS
12.5000 mg | ORAL_TABLET | Freq: Two times a day (BID) | ORAL | 1 refills | Status: DC
  Filled 2021-07-29: qty 180, 90d supply, fill #0
  Filled 2021-11-03: qty 180, 90d supply, fill #1

## 2021-07-29 MED ORDER — JANUVIA 100 MG PO TABS
100.0000 mg | ORAL_TABLET | Freq: Every day | ORAL | 1 refills | Status: DC
  Filled 2021-07-29: qty 30, 30d supply, fill #0
  Filled 2021-08-27: qty 30, 30d supply, fill #1
  Filled 2021-09-30: qty 30, 30d supply, fill #2
  Filled 2021-10-27: qty 30, 30d supply, fill #3
  Filled 2021-11-27: qty 30, 30d supply, fill #4
  Filled 2021-12-29: qty 30, 30d supply, fill #5

## 2021-07-29 MED ORDER — PIOGLITAZONE HCL 30 MG PO TABS
30.0000 mg | ORAL_TABLET | Freq: Every day | ORAL | 1 refills | Status: DC
  Filled 2021-07-29: qty 90, 90d supply, fill #0
  Filled 2021-10-20: qty 90, 90d supply, fill #1

## 2021-08-11 ENCOUNTER — Other Ambulatory Visit: Payer: Self-pay | Admitting: Internal Medicine

## 2021-08-12 ENCOUNTER — Other Ambulatory Visit (HOSPITAL_COMMUNITY): Payer: Self-pay

## 2021-08-12 MED ORDER — ALLOPURINOL 100 MG PO TABS
100.0000 mg | ORAL_TABLET | Freq: Two times a day (BID) | ORAL | 1 refills | Status: DC
  Filled 2021-08-12: qty 180, 90d supply, fill #0
  Filled 2021-11-03: qty 180, 90d supply, fill #1

## 2021-08-12 MED ORDER — METFORMIN HCL 1000 MG PO TABS
ORAL_TABLET | ORAL | 1 refills | Status: DC
  Filled 2021-08-12: qty 225, 90d supply, fill #0
  Filled 2021-12-15: qty 225, 90d supply, fill #1

## 2021-08-14 ENCOUNTER — Other Ambulatory Visit (HOSPITAL_COMMUNITY): Payer: Self-pay

## 2021-08-16 ENCOUNTER — Other Ambulatory Visit (HOSPITAL_COMMUNITY): Payer: Self-pay

## 2021-08-19 ENCOUNTER — Other Ambulatory Visit (HOSPITAL_COMMUNITY): Payer: Self-pay

## 2021-08-28 ENCOUNTER — Other Ambulatory Visit (HOSPITAL_COMMUNITY): Payer: Self-pay

## 2021-08-29 ENCOUNTER — Other Ambulatory Visit (HOSPITAL_COMMUNITY): Payer: Self-pay

## 2021-08-30 ENCOUNTER — Encounter: Payer: Self-pay | Admitting: Internal Medicine

## 2021-08-30 ENCOUNTER — Ambulatory Visit (INDEPENDENT_AMBULATORY_CARE_PROVIDER_SITE_OTHER): Payer: Medicare Other | Admitting: Internal Medicine

## 2021-08-30 VITALS — BP 142/78 | HR 78 | Temp 98.1°F | Resp 16 | Ht 72.0 in | Wt 199.5 lb

## 2021-08-30 DIAGNOSIS — E1169 Type 2 diabetes mellitus with other specified complication: Secondary | ICD-10-CM | POA: Diagnosis not present

## 2021-08-30 DIAGNOSIS — E782 Mixed hyperlipidemia: Secondary | ICD-10-CM

## 2021-08-30 DIAGNOSIS — I10 Essential (primary) hypertension: Secondary | ICD-10-CM | POA: Diagnosis not present

## 2021-08-30 DIAGNOSIS — Z23 Encounter for immunization: Secondary | ICD-10-CM

## 2021-08-30 DIAGNOSIS — Z0000 Encounter for general adult medical examination without abnormal findings: Secondary | ICD-10-CM

## 2021-08-30 LAB — PSA: PSA: 1.62 ng/mL (ref 0.10–4.00)

## 2021-08-30 LAB — CBC WITH DIFFERENTIAL/PLATELET
Basophils Absolute: 0.1 10*3/uL (ref 0.0–0.1)
Basophils Relative: 0.7 % (ref 0.0–3.0)
Eosinophils Absolute: 0.1 10*3/uL (ref 0.0–0.7)
Eosinophils Relative: 1.4 % (ref 0.0–5.0)
HCT: 37 % — ABNORMAL LOW (ref 39.0–52.0)
Hemoglobin: 12.2 g/dL — ABNORMAL LOW (ref 13.0–17.0)
Lymphocytes Relative: 23.3 % (ref 12.0–46.0)
Lymphs Abs: 1.8 10*3/uL (ref 0.7–4.0)
MCHC: 33 g/dL (ref 30.0–36.0)
MCV: 92 fl (ref 78.0–100.0)
Monocytes Absolute: 0.6 10*3/uL (ref 0.1–1.0)
Monocytes Relative: 7.3 % (ref 3.0–12.0)
Neutro Abs: 5.3 10*3/uL (ref 1.4–7.7)
Neutrophils Relative %: 67.3 % (ref 43.0–77.0)
Platelets: 194 10*3/uL (ref 150.0–400.0)
RBC: 4.02 Mil/uL — ABNORMAL LOW (ref 4.22–5.81)
RDW: 14.7 % (ref 11.5–15.5)
WBC: 7.8 10*3/uL (ref 4.0–10.5)

## 2021-08-30 LAB — MAGNESIUM: Magnesium: 1.9 mg/dL (ref 1.5–2.5)

## 2021-08-30 LAB — COMPREHENSIVE METABOLIC PANEL
ALT: 14 U/L (ref 0–53)
AST: 17 U/L (ref 0–37)
Albumin: 4.1 g/dL (ref 3.5–5.2)
Alkaline Phosphatase: 78 U/L (ref 39–117)
BUN: 19 mg/dL (ref 6–23)
CO2: 31 mEq/L (ref 19–32)
Calcium: 9.6 mg/dL (ref 8.4–10.5)
Chloride: 101 mEq/L (ref 96–112)
Creatinine, Ser: 1.16 mg/dL (ref 0.40–1.50)
GFR: 61.71 mL/min (ref >60.00)
Glucose, Bld: 119 mg/dL — ABNORMAL HIGH (ref 70–99)
Potassium: 4.6 mEq/L (ref 3.5–5.1)
Sodium: 139 mEq/L (ref 135–145)
Total Bilirubin: 0.5 mg/dL (ref 0.2–1.2)
Total Protein: 7 g/dL (ref 6.0–8.3)

## 2021-08-30 LAB — TSH: TSH: 1.22 u[IU]/mL (ref 0.35–5.50)

## 2021-08-30 LAB — HEMOGLOBIN A1C: Hgb A1c MFr Bld: 6.9 % — ABNORMAL HIGH (ref 4.6–6.5)

## 2021-08-30 NOTE — Progress Notes (Signed)
Subjective:    Patient ID: Alex Church, male    DOB: [birth year], 75 y.o.   MRN: [id number]  DOS:  08/30/2021 Type of visit - description: CPX  Since the last office visit he is doing okay and has no major concern.   Review of Systems     A 14 point review of systems is negative    Past Medical History:  Diagnosis Date   B12 deficiency    monthly shots   Diabetes mellitus    Glaucoma suspect    Hyperlipemia    Hypertension     Past Surgical History:  Procedure Laterality Date   NO PAST SURGERIES     Social History   Socioeconomic History   Marital status: Married    Spouse name: Not on file   Number of children: 0   Years of education: Not on file   Highest education level: Not on file  Occupational History   Occupation: retired from [employer], works for a lab driving  Tobacco Use   Smoking status: Never   Smokeless tobacco: Never  Substance and Sexual Activity   Alcohol use: No   Drug use: No   Sexual activity: Not on file  Other Topics Concern   Not on file  Social History Narrative   Married (2nd marriage)   Lives w/ wife     Has a rescue dog   Social Determinants of Health   Financial Resource Strain: Low Risk    Difficulty of Paying Living Expenses: Not hard at all  Food Insecurity: No Food Insecurity   Worried About Charity fundraiser in the Last Year: Never true   Arboriculturist in the Last Year: Never true  Transportation Needs: No Transportation Needs   Lack of Transportation (Medical): No   Lack of Transportation (Non-Medical): No  Physical Activity: Inactive   Days of Exercise per Week: 0 days   Minutes of Exercise per Session: 0 min  Stress: No Stress Concern Present   Feeling of Stress : Not at all  Social Connections: Moderately Isolated   Frequency of Communication with Friends and Family: More than three times a week   Frequency of Social Gatherings with Friends and Family: More than three times a week   Attends Religious  Services: Never   Marine scientist or Organizations: No   Attends Archivist Meetings: Never   Marital Status: Married  Human resources officer Violence: Not At Risk   Fear of Current or Ex-Partner: No   Emotionally Abused: No   Physically Abused: No   Sexually Abused: No    Allergies as of 08/30/2021   No Known Allergies      Medication List        Accurate as of August 30, 2021 11:59 PM. If you have any questions, ask your nurse or doctor.          allopurinol 100 MG tablet Commonly known as: ZYLOPRIM Take 1 tablet (100 mg total) by mouth 2 (two) times daily.   atorvastatin 10 MG tablet Commonly known as: LIPITOR Take 1 tablet (10 mg total) by mouth at bedtime.   CALCIUM + D PO Take 1,000 mg by mouth daily.   carvedilol 12.5 MG tablet Commonly known as: COREG Take 1 tablet (12.5 mg total) by mouth 2 (two) times daily with a meal.   cloNIDine 0.2 MG tablet Commonly known as: CATAPRES TAKE 1 TABLET (0.2 MG TOTAL) BY MOUTH 2 (TWO) TIMES  DAILY.   colchicine 0.6 MG tablet TAKE 1 TO 2 TABLETS BY MOUTH ONCE A DAY TO TO PREVENT GOUT FLARES   Fish Oil 1000 MG Caps Take by mouth.   Glucosamine-Chondroitin 500-400 MG Caps Take 3 capsules by mouth 2 (two) times daily.   glucose blood test strip Check blood sugars once daily   Januvia 100 MG tablet Generic drug: sitaGLIPtin Take 1 tablet (100 mg total) by mouth daily.   lisinopril 20 MG tablet Commonly known as: ZESTRIL Take 1 tablet (20 mg total) by mouth 2 (two) times daily.   Magnesium Oxide 400 MG Caps Take 2 capsules (800 mg total) by mouth 2 (two) times daily.   meloxicam 7.5 MG tablet Commonly known as: MOBIC TAKE 1 TABLET (7.5 MG TOTAL) BY MOUTH DAILY AS NEEDED FOR PAIN.   metFORMIN 1000 MG tablet Commonly known as: GLUCOPHAGE Take 1 & 1/2 tablets (1,500 mg total) by mouth in the morning AND 1 tablet (1,000 mg total) every evening.   multivitamin tablet Take 1 tablet by mouth  daily.   ONE TOUCH LANCETS Misc Check blood sugar once daily   OneTouch Verio Flex System w/Device Kit Check blood sugar once daily   pioglitazone 30 MG tablet Commonly known as: ACTOS Take 1 tablet (30 mg total) by mouth daily.           Objective:   Physical Exam BP (!) 142/78 (BP Location: Left Arm, Patient Position: Sitting, Cuff Size: Small)    Pulse 78    Temp 98.1 F (36.7 C) (Oral)    Resp 16    Ht 6' (1.829 m)    Wt 199 lb 8 oz (90.5 kg)    SpO2 97%    BMI 27.06 kg/m  General: Well developed, NAD, BMI noted Neck: No  thyromegaly  HEENT:  Normocephalic . Face symmetric, atraumatic Lungs:  CTA B Normal respiratory effort, no intercostal retractions, no accessory muscle use. Heart: RRR,  no murmur.  Abdomen:  Not distended, soft, non-tender. No rebound or rigidity. DRE: Declined Lower extremities: no pretibial edema bilaterally  Skin: Exposed areas without rash. Not pale. Not jaundice Neurologic:  alert & oriented X3.  Speech normal, gait appropriate for age and unassisted Strength symmetric and appropriate for age.  Psych: Cognition and judgment appear intact.  Cooperative with normal attention span and concentration.  Behavior appropriate. No anxious or depressed appearing.     Assessment     Assessment (transfer from Dr Linna Darner 02-2016)  DM HTN Hyperlipidemia Frequent PVCs versus trigeminy (resolved after hypomagnesemia treatment) Hypomagnesemia: Not on PPIs on diuretics. 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 +FH CAD  PLAN: Here for CPX DM: CBGs in the morning in the 140s, in the afternoon typically better in the 110s. Continue Januvia, metformin, Actos.  Labs. HTN BP today 142/78, at home typically in the 120s.  Continue carvedilol, lisinopril, clonidine.  Labs. Hypomagnesemia: Labs, on supplements Tophaceous  gout: On allopurinol. B12 deficiency: Last levels very good, on oral supplements. RTC 6 months   This visit occurred during the SARS-CoV-2 public health emergency.  Safety protocols were in place, including screening questions prior to the visit, additional usage of staff PPE, and extensive cleaning of exam room while observing appropriate contact time as indicated for disinfecting solutions.

## 2021-08-30 NOTE — Patient Instructions (Addendum)
Check the  blood pressure regularly BP GOAL is between 110/65 and  135/85. If it is consistently higher or lower, let me know  Vaccines recommend: Shingrix  GO TO THE LAB : Get the blood work     [place], [place] back for a checkup in 6 months

## 2021-09-02 ENCOUNTER — Encounter: Payer: Self-pay | Admitting: Internal Medicine

## 2021-09-02 NOTE — Assessment & Plan Note (Signed)
-  Tdap : 12-2019  -pnm 23: 2019 - pnm 13: 12/2016 - zostavax  2014 -Shingrex recommended. - s/p Covid vax: Up-to-date - flu shot today --CCS: Never had a scope, declines screenings -Prostate cancer screening: Declines DRE, no symptoms, we agreed on check a PSA. - Patient education: Recommend to continue with his healthy lifestyle --Advance care planning:  info provided

## 2021-09-02 NOTE — Assessment & Plan Note (Signed)
Here for CPX DM: CBGs in the morning in the 140s, in the afternoon typically better in the 110s. Continue Januvia, metformin, Actos.  Labs. HTN BP today 142/78, at home typically in the 120s.  Continue carvedilol, lisinopril, clonidine.  Labs. 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 B12 deficiency: Last levels very good, on oral supplements. RTC 6 months

## 2021-09-10 ENCOUNTER — Other Ambulatory Visit: Payer: Self-pay | Admitting: Internal Medicine

## 2021-09-10 ENCOUNTER — Other Ambulatory Visit (HOSPITAL_COMMUNITY): Payer: Self-pay

## 2021-09-10 MED ORDER — ATORVASTATIN CALCIUM 10 MG PO TABS
10.0000 mg | ORAL_TABLET | Freq: Every day | ORAL | 1 refills | Status: DC
  Filled 2021-09-10: qty 90, 90d supply, fill #0
  Filled 2021-12-15: qty 90, 90d supply, fill #1

## 2021-09-10 MED ORDER — CLONIDINE HCL 0.2 MG PO TABS
0.2000 mg | ORAL_TABLET | Freq: Two times a day (BID) | ORAL | 1 refills | Status: DC
Start: 2021-09-10 — End: 2022-09-08
  Filled 2021-09-10: qty 180, 90d supply, fill #0
  Filled 2022-03-09: qty 180, 90d supply, fill #1

## 2021-09-11 ENCOUNTER — Other Ambulatory Visit (HOSPITAL_COMMUNITY): Payer: Self-pay

## 2021-09-26 ENCOUNTER — Ambulatory Visit: Payer: Medicare Other

## 2021-09-30 ENCOUNTER — Other Ambulatory Visit (HOSPITAL_COMMUNITY): Payer: Self-pay

## 2021-10-10 ENCOUNTER — Ambulatory Visit (INDEPENDENT_AMBULATORY_CARE_PROVIDER_SITE_OTHER): Payer: Medicare Other

## 2021-10-10 ENCOUNTER — Ambulatory Visit: Payer: Medicare Other

## 2021-10-10 VITALS — BP 138/74 | HR 71 | Temp 98.1°F | Resp 16 | Ht 72.0 in | Wt 199.6 lb

## 2021-10-10 DIAGNOSIS — Z0000 Encounter for general adult medical examination without abnormal findings: Secondary | ICD-10-CM | POA: Diagnosis not present

## 2021-10-10 NOTE — Progress Notes (Signed)
Subjective:   Alex Church is a 76 y.o. male who presents for Medicare Annual/Subsequent preventive examination.  Review of Systems     Cardiac Risk Factors include: advanced age (>53mn, >>88women);male gender;hypertension;diabetes mellitus;dyslipidemia;obesity (BMI >30kg/m2)     Objective:    Today's Vitals   10/10/21 1419  BP: 138/74  Pulse: 71  Resp: 16  Temp: 98.1 F (36.7 C)  TempSrc: Oral  SpO2: 97%  Weight: 199 lb 9.6 oz (90.5 kg)  Height: 6' (1.829 m)   Body mass index is 27.07 kg/m.  Advanced Directives 10/10/2021 09/20/2020 08/15/2019 08/02/2018  Does Patient Have a Medical Advance Directive? Yes No No Yes  Type of AParamedicof [place] will - - [place]Living will  Copy of HBuc[place]in Chart? No - copy requested - - No - copy requested  Would patient like information on creating a medical advance directive? - No - Patient declined No - Patient declined -    Current Medications (verified) Outpatient Encounter Medications as of 10/10/2021  Medication Sig   allopurinol (ZYLOPRIM) 100 MG tablet Take 1 tablet (100 mg total) by mouth 2 (two) times daily.   atorvastatin (LIPITOR) 10 MG tablet Take 1 tablet (10 mg total) by mouth at bedtime.   Blood Glucose Monitoring Suppl (ONETOUCH VERIO FLEX SYSTEM) w/Device KIT Check blood sugar once daily   Calcium Carbonate-Vitamin D (CALCIUM + D PO) Take 1,000 mg by mouth daily.   carvedilol (COREG) 12.5 MG tablet Take 1 tablet (12.5 mg total) by mouth 2 (two) times daily with a meal.   cloNIDine (CATAPRES) 0.2 MG tablet Take 1 tablet (0.2 mg total) by mouth 2 (two) times daily.   glucose blood test strip Check blood sugars once daily   JANUVIA 100 MG tablet Take 1 tablet (100 mg total) by mouth daily.   lisinopril (ZESTRIL) 20 MG tablet Take 1 tablet (20 mg total) by mouth 2 (two) times daily.   Magnesium Oxide 400 MG CAPS Take 2 capsules (800 mg total) by  mouth 2 (two) times daily.   meloxicam (MOBIC) 7.5 MG tablet TAKE 1 TABLET (7.5 MG TOTAL) BY MOUTH DAILY AS NEEDED FOR PAIN.   metFORMIN (GLUCOPHAGE) 1000 MG tablet Take 1 & 1/2 tablets (1,500 mg total) by mouth in the morning AND 1 tablet (1,000 mg total) every evening.   Multiple Vitamin (MULTIVITAMIN) tablet Take 1 tablet by mouth daily.   ONE TOUCH LANCETS MISC Check blood sugar once daily   pioglitazone (ACTOS) 30 MG tablet Take 1 tablet (30 mg total) by mouth daily.   [DISCONTINUED] colchicine 0.6 MG tablet TAKE 1 TO 2 TABLETS BY MOUTH ONCE A DAY TO TO PREVENT GOUT FLARES (Patient not taking: Reported on 08/30/2021)   No facility-administered encounter medications on file as of 10/10/2021.    Allergies (verified) Patient has no known allergies.   History: Past Medical History:  Diagnosis Date   B12 deficiency    monthly shots   Diabetes mellitus    Glaucoma suspect    Hyperlipemia    Hypertension    Past Surgical History:  Procedure Laterality Date   NO PAST SURGERIES     Family History  Problem Relation Age of Onset   Diabetes Maternal Uncle    Heart attack Brother 456  Cancer Mother        intra-abdominal   Cirrhosis Cousin        maternal   Diabetes Cousin  maternal   Stroke Neg Hx    Colon cancer Neg Hx    Prostate cancer Neg Hx    Social History   Socioeconomic History   Marital status: Married    Spouse name: Not on file   Number of children: 0   Years of education: Not on file   Highest education level: Not on file  Occupational History   Occupation: retired from [employer], works for a lab driving  Tobacco Use   Smoking status: Never   Smokeless tobacco: Never  Substance and Sexual Activity   Alcohol use: No   Drug use: No   Sexual activity: Not on file  Other Topics Concern   Not on file  Social History Narrative   Married (2nd marriage)   Lives w/ wife     Has a rescue dog   Social Determinants of Health   Financial Resource Strain:  Low Risk    Difficulty of Paying Living Expenses: Not hard at all  Food Insecurity: No Food Insecurity   Worried About Charity fundraiser in the Last Year: Never true   Arboriculturist in the Last Year: Never true  Transportation Needs: No Transportation Needs   Lack of Transportation (Medical): No   Lack of Transportation (Non-Medical): No  Physical Activity: Sufficiently Active   Days of Exercise per Week: 7 days   Minutes of Exercise per Session: 30 min  Stress: No Stress Concern Present   Feeling of Stress : Not at all  Social Connections: Moderately Isolated   Frequency of Communication with Friends and Family: More than three times a week   Frequency of Social Gatherings with Friends and Family: More than three times a week   Attends Religious Services: Never   Marine scientist or Organizations: No   Attends Music therapist: Never   Marital Status: Married    Tobacco Counseling Counseling given: Not Answered   Clinical Intake:  Pre-visit preparation completed: Yes  Pain : No/denies pain     BMI - recorded: 27.07 Nutritional Status: BMI 25 -29 Overweight Nutritional Risks: None Diabetes: No  How often do you need to have someone help you when you read instructions, pamphlets, or other written materials from your doctor or pharmacy?: 1 - Never  Diabetes:  Is the patient diabetic?  Yes  If diabetic, was a CBG obtained today?  No  Did the patient bring in their glucometer from home?  No  How often do you monitor your CBG's? daily.   Financial Strains and Diabetes Management:  Are you having any financial strains with the device, your supplies or your medication? No .  Does the patient want to be seen by Chronic Care Management for management of their diabetes?  No  Would the patient like to be referred to a Nutritionist or for Diabetic Management?  No   Diabetic Exams:  Diabetic Eye Exam: Completed 05/16/2021.   Diabetic Foot Exam: Pt has  been advised about the importance in completing this exam. To be completed by PCP.  Interpreter Needed?: No  Information entered by :: Caroleen Hamman LPN   Activities of Daily Living In your present state of health, do you have any difficulty performing the following activities: 10/10/2021  Hearing? N  Vision? N  Difficulty concentrating or making decisions? N  Walking or climbing stairs? N  Dressing or bathing? N  Doing errands, shopping? N  Preparing Food and eating ? N  Using the Toilet?  N  In the past six months, have you accidently leaked urine? N  Do you have problems with loss of bowel control? N  Managing your Medications? N  Managing your Finances? N  Housekeeping or managing your Housekeeping? N  Some recent data might be hidden    Patient Care Team: Colon Branch, MD as PCP - General (Internal Medicine) Monna Fam, MD as Consulting Physician (Ophthalmology) Prudencio Pair as Physician Assistant (Rheumatology)  Indicate any recent Medical Services you may have received from other than [facility] providers in the past year (date may be approximate).     Assessment:   This is a routine wellness examination for Alex Church.  Hearing/Vision screen Hearing Screening - Comments:: No issues Vision Screening - Comments:: Last eye exam-05/2021  Dietary issues and exercise activities discussed: Current Exercise Habits: Home exercise routine, Type of exercise: walking, Time (Minutes): 30, Frequency (Times/Week): 7, Weekly Exercise (Minutes/Week): 210, Intensity: Mild, Exercise limited by: None identified   Goals Addressed             This Visit's Progress    Maintain healthy active lifestyle.   On track      Depression Screen PHQ 2/9 Scores 10/10/2021 08/30/2021 01/11/2021 09/20/2020 08/15/2019 05/06/2019 08/02/2018  PHQ - 2 Score 0 0 0 0 0 0 0    Fall Risk Fall Risk  10/10/2021 08/30/2021 01/11/2021 09/20/2020 08/15/2019  Falls in the past year? 0 0 0 0 0  Comment - - -  - -  Number falls in past yr: 0 0 0 1 -  Injury with Fall? 0 0 0 0 -  Risk for fall due to : - - - History of fall(s) -  Follow up Falls prevention discussed Falls evaluation completed Falls evaluation completed Falls prevention discussed Education provided;Falls prevention discussed    FALL RISK PREVENTION PERTAINING TO THE HOME:  Any stairs in or around the home? Yes  If so, are there any without handrails? No  Home free of loose throw rugs in walkways, pet beds, electrical cords, etc? Yes  Adequate lighting in your home to reduce risk of falls? Yes   ASSISTIVE DEVICES UTILIZED TO PREVENT FALLS:  Life alert? No  Use of a cane, walker or w/c? No  Grab bars in the bathroom? No  Shower chair or bench in shower? No  Elevated toilet seat or a handicapped toilet? No   TIMED UP AND GO:  Was the test performed? Yes .  Length of time to ambulate 10 feet: 11 sec.   Gait steady and fast without use of assistive device  Cognitive Function:Normal cognitive status assessed by direct observation by this Nurse Health Advisor. No abnormalities found.          Immunizations Immunization History  Administered Date(s) Administered   Fluad Quad(high Dose 65+) 07/07/2019, 06/20/2020, 08/30/2021   Influenza Split 06/04/2012   Influenza, High Dose Seasonal PF 06/27/2015, 06/30/2016, 06/26/2017, 07/02/2018   Influenza,inj,Quad PF,6+ Mos 06/17/2013, 06/23/2014   PFIZER(Purple Top)SARS-COV-2 Vaccination 09/23/2019, 10/14/2019, 07/13/2020   Pfizer Covid-19 Vaccine Bivalent Booster 58yr & up 06/16/2021   Pneumococcal Conjugate-13 12/19/2016   Pneumococcal Polysaccharide-23 01/25/2018   Td 09/08/2008   Tdap 12/13/2019   Zoster, Live 03/18/2013    TDAP status: Up to date  Flu Vaccine status: Up to date  Pneumococcal vaccine status: Up to date  Covid-19 vaccine status: Completed vaccines  Qualifies for Shingles Vaccine? Yes   Zostavax completed Yes   Shingrix Completed?: No.  Education has been provided regarding the importance of this vaccine. Patient has been advised to call insurance company to determine out of pocket expense if they have not yet received this vaccine. Advised may also receive vaccine at local pharmacy or Health Dept. Verbalized acceptance and understanding.  Screening Tests Health Maintenance  Topic Date Due   COLONOSCOPY (Pts 45-81yr Insurance coverage will need to be confirmed)  Never done   Zoster Vaccines- Shingrix (1 of 2) Never done   FOOT EXAM  06/20/2021   HEMOGLOBIN A1C  02/28/2022   OPHTHALMOLOGY EXAM  05/16/2022   TETANUS/TDAP  12/12/2029   Pneumonia Vaccine 76 Years old  Completed   INFLUENZA VACCINE  Completed   COVID-19 Vaccine  Completed   Hepatitis C Screening  Completed   HPV VACCINES  Aged Out    Health Maintenance  Health Maintenance Due  Topic Date Due   COLONOSCOPY (Pts 45-429yrInsurance coverage will need to be confirmed)  Never done   Zoster Vaccines- Shingrix (1 of 2) Never done   FOOT EXAM  06/20/2021    Colorectal cancer screening: Declined  Lung Cancer Screening: (Low Dose CT Chest recommended if Age 76-80ears, 30 pack-year currently smoking OR have quit w/in 15years.) does not qualify.     Additional Screening:  Hepatitis C Screening: Completed 12/19/2016  Vision Screening: Recommended annual ophthalmology exams for early detection of glaucoma and other disorders of the eye. Is the patient up to date with their annual eye exam?  Yes  Who is the provider or what is the name of the office in which the patient attends annual eye exams? [facility]: Recommended annual dental exams for proper oral hygiene  Community Resource Referral / Chronic Care Management: CRR required this visit?  No   CCM required this visit?  No      Plan:     I have personally reviewed and noted the following in the patients chart:   Medical and social history Use of alcohol, tobacco or illicit  drugs  Current medications and supplements including opioid prescriptions. Patient is not currently taking opioid prescriptions. Functional ability and status Nutritional status Physical activity Advanced directives List of other physicians Hospitalizations, surgeries, and ER visits in previous 12 months Vitals Screenings to include cognitive, depression, and falls Referrals and appointments  In addition, I have reviewed and discussed with patient certain preventive protocols, quality metrics, and best practice recommendations. A written personalized care plan for preventive services as well as general preventive health recommendations were provided to patient.   Patient would like to access avs on mychart.  MaMarta AntuLPN   2/9/6/7227Nurse Health Advisor  Nurse Notes: None

## 2021-10-10 NOTE — Patient Instructions (Signed)
[name] , Thank you for taking time to come for your Medicare Wellness Visit. I appreciate your ongoing commitment to your health goals. Please review the following plan we discussed and let me know if I can assist you in the future.   Screening recommendations/referrals: Colonoscopy: Declined Recommended yearly ophthalmology/optometry visit for glaucoma screening and checkup Recommended yearly dental visit for hygiene and checkup  Vaccinations: Influenza vaccine: Up to date Pneumococcal vaccine: Up to date Tdap vaccine: Up to date Shingles vaccine: Due-May obtain vaccine at your local pharmacy.   Covid-19: Up to date  Advanced directives: Please bring a copy of Living Will and/or Healthcare Power of Attorney for your chart.   Conditions/risks identified: See problem list  Next appointment: Follow up in one year for your annual wellness visit. 10/13/2022 @ 2:20.  Preventive Care 76 Years and Older, Male Preventive care refers to lifestyle choices and visits with your health care provider that can promote health and wellness. What does preventive care include? A yearly physical exam. This is also called an annual well check. Dental exams once or twice a year. Routine eye exams. Ask your health care provider how often you should have your eyes checked. Personal lifestyle choices, including: Daily care of your teeth and gums. Regular physical activity. Eating a healthy diet. Avoiding tobacco and drug use. Limiting alcohol use. Practicing safe sex. Taking low doses of aspirin every day. Taking vitamin and mineral supplements as recommended by your health care provider. What happens during an annual well check? The services and screenings done by your health care provider during your annual well check will depend on your age, overall health, lifestyle risk factors, and family history of disease. Counseling  Your health care provider may ask you questions about your: Alcohol  use. Tobacco use. Drug use. Emotional well-being. Home and relationship well-being. Sexual activity. Eating habits. History of falls. Memory and ability to understand (cognition). Work and work Statistician. Screening  You may have the following tests or measurements: Height, weight, and BMI. Blood pressure. Lipid and cholesterol levels. These may be checked every 5 years, or more frequently if you are over 64 years old. Skin check. Lung cancer screening. You may have this screening every year starting at age 44 if you have a 30-pack-year history of smoking and currently smoke or have quit within the past 15 years. Fecal occult blood test (FOBT) of the stool. You may have this test every year starting at age 20. Flexible sigmoidoscopy or colonoscopy. You may have a sigmoidoscopy every 5 years or a colonoscopy every 10 years starting at age 76. Prostate cancer screening. Recommendations will vary depending on your family history and other risks. Hepatitis C blood test. Hepatitis B blood test. Sexually transmitted disease (STD) testing. Diabetes screening. This is done by checking your blood sugar (glucose) after you have not eaten for a while (fasting). You may have this done every 1-3 years. Abdominal aortic aneurysm (AAA) screening. You may need this if you are a current or former smoker. Osteoporosis. You may be screened starting at age 22 if you are at high risk. Talk with your health care provider about your test results, treatment options, and if necessary, the need for more tests. Vaccines  Your health care provider may recommend certain vaccines, such as: Influenza vaccine. This is recommended every year. Tetanus, diphtheria, and acellular pertussis (Tdap, Td) vaccine. You may need a Td booster every 10 years. Zoster vaccine. You may need this after age 76. Pneumococcal 13-valent  conjugate (PCV13) vaccine. One dose is recommended after age 76. Pneumococcal polysaccharide  (PPSV23) vaccine. One dose is recommended after age 76. Talk to your health care provider about which screenings and vaccines you need and how often you need them. This information is not intended to replace advice given to you by your health care provider. Make sure you discuss any questions you have with your health care provider. Document Released: 09/21/2015 Document Revised: 05/14/2016 Document Reviewed: 06/26/2015 Elsevier Interactive Patient Education  2017 [place] Prevention in the Home Falls can cause injuries. They can happen to people of all ages. There are many things you can do to make your home safe and to help prevent falls. What can I do on the outside of my home? Regularly fix the edges of walkways and driveways and fix any cracks. Remove anything that might make you trip as you walk through a door, such as a raised step or threshold. Trim any bushes or trees on the path to your home. Use bright outdoor lighting. Clear any walking paths of anything that might make someone trip, such as rocks or tools. Regularly check to see if handrails are loose or broken. Make sure that both sides of any steps have handrails. Any raised decks and porches should have guardrails on the edges. Have any leaves, snow, or ice cleared regularly. Use sand or salt on walking paths during winter. Clean up any spills in your garage right away. This includes oil or grease spills. What can I do in the bathroom? Use night lights. Install grab bars by the toilet and in the tub and shower. Do not use towel bars as grab bars. Use non-skid mats or decals in the tub or shower. If you need to sit down in the shower, use a plastic, non-slip stool. Keep the floor dry. Clean up any water that spills on the floor as soon as it happens. Remove soap buildup in the tub or shower regularly. Attach bath mats securely with double-sided non-slip rug tape. Do not have throw rugs and other things on the  floor that can make you trip. What can I do in the bedroom? Use night lights. Make sure that you have a light by your bed that is easy to reach. Do not use any sheets or blankets that are too big for your bed. They should not hang down onto the floor. Have a firm chair that has side arms. You can use this for support while you get dressed. Do not have throw rugs and other things on the floor that can make you trip. What can I do in the kitchen? Clean up any spills right away. Avoid walking on wet floors. Keep items that you use a lot in easy-to-reach places. If you need to reach something above you, use a strong step stool that has a grab bar. Keep electrical cords out of the way. Do not use floor polish or wax that makes floors slippery. If you must use wax, use non-skid floor wax. Do not have throw rugs and other things on the floor that can make you trip. What can I do with my stairs? Do not leave any items on the stairs. Make sure that there are handrails on both sides of the stairs and use them. Fix handrails that are broken or loose. Make sure that handrails are as long as the stairways. Check any carpeting to make sure that it is firmly attached to the stairs. Fix any carpet that  is loose or worn. Avoid having throw rugs at the top or bottom of the stairs. If you do have throw rugs, attach them to the floor with carpet tape. Make sure that you have a light switch at the top of the stairs and the bottom of the stairs. If you do not have them, ask someone to add them for you. What else can I do to help prevent falls? Wear shoes that: Do not have high heels. Have rubber bottoms. Are comfortable and fit you well. Are closed at the toe. Do not wear sandals. If you use a stepladder: Make sure that it is fully opened. Do not climb a closed stepladder. Make sure that both sides of the stepladder are locked into place. Ask someone to hold it for you, if possible. Clearly mark and make  sure that you can see: Any grab bars or handrails. First and last steps. Where the edge of each step is. Use tools that help you move around (mobility aids) if they are needed. These include: Canes. Walkers. Scooters. Crutches. Turn on the lights when you go into a dark area. Replace any light bulbs as soon as they burn out. Set up your furniture so you have a clear path. Avoid moving your furniture around. If any of your floors are uneven, fix them. If there are any pets around you, be aware of where they are. Review your medicines with your doctor. Some medicines can make you feel dizzy. This can increase your chance of falling. Ask your doctor what other things that you can do to help prevent falls. This information is not intended to replace advice given to you by your health care provider. Make sure you discuss any questions you have with your health care provider. Document Released: 06/21/2009 Document Revised: 01/31/2016 Document Reviewed: 09/29/2014 Elsevier Interactive Patient Education  2017 [employer].

## 2021-10-20 ENCOUNTER — Other Ambulatory Visit: Payer: Self-pay | Admitting: Internal Medicine

## 2021-10-21 ENCOUNTER — Other Ambulatory Visit (HOSPITAL_COMMUNITY): Payer: Self-pay

## 2021-10-21 MED ORDER — LISINOPRIL 20 MG PO TABS
20.0000 mg | ORAL_TABLET | Freq: Two times a day (BID) | ORAL | 1 refills | Status: DC
Start: 2021-10-21 — End: 2022-04-27
  Filled 2021-10-21: qty 180, 90d supply, fill #0
  Filled 2022-01-21: qty 180, 90d supply, fill #1

## 2021-10-28 ENCOUNTER — Other Ambulatory Visit (HOSPITAL_COMMUNITY): Payer: Self-pay

## 2021-11-04 ENCOUNTER — Other Ambulatory Visit (HOSPITAL_COMMUNITY): Payer: Self-pay

## 2021-11-07 ENCOUNTER — Encounter: Payer: Self-pay | Admitting: Internal Medicine

## 2021-11-28 ENCOUNTER — Other Ambulatory Visit (HOSPITAL_COMMUNITY): Payer: Self-pay

## 2021-12-16 ENCOUNTER — Other Ambulatory Visit (HOSPITAL_COMMUNITY): Payer: Self-pay

## 2021-12-30 ENCOUNTER — Other Ambulatory Visit (HOSPITAL_COMMUNITY): Payer: Self-pay

## 2022-01-21 ENCOUNTER — Other Ambulatory Visit: Payer: Self-pay | Admitting: Internal Medicine

## 2022-01-22 ENCOUNTER — Other Ambulatory Visit (HOSPITAL_COMMUNITY): Payer: Self-pay

## 2022-01-22 MED ORDER — JANUVIA 100 MG PO TABS
100.0000 mg | ORAL_TABLET | Freq: Every day | ORAL | 0 refills | Status: DC
Start: 2022-01-22 — End: 2022-04-27
  Filled 2022-01-22: qty 90, 90d supply, fill #0

## 2022-01-22 MED ORDER — PIOGLITAZONE HCL 30 MG PO TABS
30.0000 mg | ORAL_TABLET | Freq: Every day | ORAL | 0 refills | Status: DC
  Filled 2022-01-22: qty 90, 90d supply, fill #0

## 2022-01-27 ENCOUNTER — Other Ambulatory Visit: Payer: Self-pay | Admitting: Internal Medicine

## 2022-01-28 ENCOUNTER — Other Ambulatory Visit (HOSPITAL_COMMUNITY): Payer: Self-pay

## 2022-01-28 MED ORDER — CARVEDILOL 12.5 MG PO TABS
12.5000 mg | ORAL_TABLET | Freq: Two times a day (BID) | ORAL | 0 refills | Status: DC
  Filled 2022-01-28: qty 60, 30d supply, fill #0

## 2022-02-06 ENCOUNTER — Other Ambulatory Visit: Payer: Self-pay | Admitting: Internal Medicine

## 2022-02-07 ENCOUNTER — Other Ambulatory Visit (HOSPITAL_COMMUNITY): Payer: Self-pay

## 2022-02-07 MED ORDER — ALLOPURINOL 100 MG PO TABS
100.0000 mg | ORAL_TABLET | Freq: Two times a day (BID) | ORAL | 0 refills | Status: DC
Start: 2022-02-07 — End: 2022-03-12
  Filled 2022-02-07: qty 60, 30d supply, fill #0

## 2022-03-09 ENCOUNTER — Other Ambulatory Visit: Payer: Self-pay | Admitting: Internal Medicine

## 2022-03-10 ENCOUNTER — Other Ambulatory Visit (HOSPITAL_COMMUNITY): Payer: Self-pay

## 2022-03-10 MED ORDER — CARVEDILOL 12.5 MG PO TABS
12.5000 mg | ORAL_TABLET | Freq: Two times a day (BID) | ORAL | 0 refills | Status: DC
  Filled 2022-03-10: qty 180, 90d supply, fill #0

## 2022-03-10 MED ORDER — ATORVASTATIN CALCIUM 10 MG PO TABS
10.0000 mg | ORAL_TABLET | Freq: Every day | ORAL | 0 refills | Status: DC
Start: 2022-03-10 — End: 2022-06-10
  Filled 2022-03-10: qty 90, 90d supply, fill #0

## 2022-03-12 ENCOUNTER — Other Ambulatory Visit: Payer: Self-pay | Admitting: Internal Medicine

## 2022-03-13 ENCOUNTER — Other Ambulatory Visit (HOSPITAL_COMMUNITY): Payer: Self-pay

## 2022-03-13 MED ORDER — ALLOPURINOL 100 MG PO TABS
100.0000 mg | ORAL_TABLET | Freq: Two times a day (BID) | ORAL | 1 refills | Status: DC
  Filled 2022-03-13: qty 60, 30d supply, fill #0
  Filled 2022-04-16: qty 60, 30d supply, fill #1

## 2022-03-13 MED ORDER — METFORMIN HCL 1000 MG PO TABS
ORAL_TABLET | ORAL | 1 refills | Status: DC
  Filled 2022-03-13: qty 75, 30d supply, fill #0

## 2022-03-27 ENCOUNTER — Encounter: Payer: Self-pay | Admitting: Internal Medicine

## 2022-03-27 DIAGNOSIS — H524 Presbyopia: Secondary | ICD-10-CM | POA: Diagnosis not present

## 2022-03-27 DIAGNOSIS — H40013 Open angle with borderline findings, low risk, bilateral: Secondary | ICD-10-CM | POA: Diagnosis not present

## 2022-03-27 DIAGNOSIS — E119 Type 2 diabetes mellitus without complications: Secondary | ICD-10-CM | POA: Diagnosis not present

## 2022-03-27 DIAGNOSIS — H25813 Combined forms of age-related cataract, bilateral: Secondary | ICD-10-CM | POA: Diagnosis not present

## 2022-03-27 LAB — HM DIABETES EYE EXAM

## 2022-03-28 ENCOUNTER — Ambulatory Visit (INDEPENDENT_AMBULATORY_CARE_PROVIDER_SITE_OTHER): Payer: Medicare Other | Admitting: Internal Medicine

## 2022-03-28 ENCOUNTER — Encounter: Payer: Self-pay | Admitting: Internal Medicine

## 2022-03-28 VITALS — BP 124/80 | HR 66 | Temp 98.0°F | Resp 16 | Ht 72.0 in | Wt 199.0 lb

## 2022-03-28 DIAGNOSIS — D649 Anemia, unspecified: Secondary | ICD-10-CM

## 2022-03-28 DIAGNOSIS — E1169 Type 2 diabetes mellitus with other specified complication: Secondary | ICD-10-CM | POA: Diagnosis not present

## 2022-03-28 DIAGNOSIS — R197 Diarrhea, unspecified: Secondary | ICD-10-CM | POA: Diagnosis not present

## 2022-03-28 LAB — CBC WITH DIFFERENTIAL/PLATELET
Basophils Absolute: 0.1 10*3/uL (ref 0.0–0.1)
Basophils Relative: 0.7 % (ref 0.0–3.0)
Eosinophils Absolute: 0.1 10*3/uL (ref 0.0–0.7)
Eosinophils Relative: 1.7 % (ref 0.0–5.0)
HCT: 38.2 % — ABNORMAL LOW (ref 39.0–52.0)
Hemoglobin: 12.6 g/dL — ABNORMAL LOW (ref 13.0–17.0)
Lymphocytes Relative: 19.7 % (ref 12.0–46.0)
Lymphs Abs: 1.6 10*3/uL (ref 0.7–4.0)
MCHC: 33 g/dL (ref 30.0–36.0)
MCV: 91.7 fl (ref 78.0–100.0)
Monocytes Absolute: 0.5 10*3/uL (ref 0.1–1.0)
Monocytes Relative: 6.3 % (ref 3.0–12.0)
Neutro Abs: 6 10*3/uL (ref 1.4–7.7)
Neutrophils Relative %: 71.6 % (ref 43.0–77.0)
Platelets: 182 10*3/uL (ref 150.0–400.0)
RBC: 4.17 Mil/uL — ABNORMAL LOW (ref 4.22–5.81)
RDW: 15.5 % (ref 11.5–15.5)
WBC: 8.3 10*3/uL (ref 4.0–10.5)

## 2022-03-28 LAB — FERRITIN: Ferritin: 101.7 ng/mL (ref 22.0–322.0)

## 2022-03-28 LAB — HEMOGLOBIN A1C: Hgb A1c MFr Bld: 6.9 % — ABNORMAL HIGH (ref 4.6–6.5)

## 2022-03-28 LAB — IRON: Iron: 68 ug/dL (ref 42–165)

## 2022-03-28 NOTE — Patient Instructions (Addendum)
Stop metformin completely for 2 weeks  Then restart metformin 1000 mg: Half tablet with breakfast for 10 days, then half tablet with breakfast and dinner.  If you continue with diarrhea, let me know.   Recommend to proceed with covid booster (bivalent) at your pharmacy.  Flu shot this fall.  Please drop off or mail [place] a copy of your Healthcare Power of Attorney for your chart.      GO TO THE LAB : Get the blood work     [place], [place] back for   a checkup in 3 months

## 2022-03-28 NOTE — Assessment & Plan Note (Signed)
Diarrhea: As described above, patient thinks could be from metformin, I agree, that is possible. Plan: Stop metformin for 2 weeks and then restart at a lower dose, see AVS. DM: Well-controlled on Actos, Januvia and metformin.  See above.  Check A1c Mild anemia: Hemoglobin has trended down, check iron, ferritin and CBC of note this, he has never had a colonoscopy.  Further advised with results. Preventive care: Vaccine advice provided. RTC 3 m

## 2022-03-28 NOTE — Progress Notes (Signed)
Subjective:    Patient ID: Alex Church, male    DOB: 1946-01-19, 76 y.o.   MRN: [id number]  DOS:  03/28/2022 Type of visit - description: Routine checkup  In general feeling well. Does think that metformin is causing issues: Report a long history of on and off loose stools for a while, could not be more specific but symptoms seems to be slightly worse lately. Describes stools as loose ~ 3 times every week He denies fever or chills. No weight loss. No abdominal pain or blood in the stools.   Review of Systems See above   Past Medical History:  Diagnosis Date   B12 deficiency    monthly shots   Diabetes mellitus    Glaucoma suspect    Hyperlipemia    Hypertension     Past Surgical History:  Procedure Laterality Date   NO PAST SURGERIES      Current Outpatient Medications  Medication Instructions   allopurinol (ZYLOPRIM) 100 mg, Oral, 2 times daily   atorvastatin (LIPITOR) 10 mg, Oral, Daily at bedtime   Blood Glucose Monitoring Suppl ([place]) w/Device KIT Check blood sugar once daily   Calcium Carbonate-Vitamin D (CALCIUM + D PO) 1,000 mg, Oral, Daily,     carvedilol (COREG) 12.5 mg, Oral, 2 times daily with meals   cloNIDine (CATAPRES) 0.2 mg, Oral, 2 times daily   glucose blood test strip Check blood sugars once daily   Januvia 100 mg, Oral, Daily   lisinopril (ZESTRIL) 20 mg, Oral, 2 times daily   Magnesium Oxide 800 mg, Oral, 2 times daily   metFORMIN (GLUCOPHAGE) 1000 MG tablet Take 1.5 tablets (1,500 mg total) by mouth in the morning AND 1 tablet (1,000 mg total) every evening.   Multiple Vitamin (MULTIVITAMIN) tablet 1 tablet, Oral, Daily,     ONE TOUCH LANCETS MISC Check blood sugar once daily   pioglitazone (ACTOS) 30 mg, Oral, Daily       Objective:   Physical Exam BP 124/80   Pulse 66   Temp 98 F (36.7 C) (Oral)   Resp 16   Ht 6' (1.829 m)   Wt 199 lb (90.3 kg)   SpO2 96%   BMI 26.99 kg/m  General:   Well developed,  NAD, BMI noted. HEENT:  Normocephalic . Face symmetric, atraumatic Lungs:  CTA B Normal respiratory effort, no intercostal retractions, no accessory muscle use. Heart: RRR,  no murmur.  Lower extremities: no pretibial edema bilaterally  Skin: Not pale. Not jaundice Neurologic:  alert & oriented X3.  Speech normal, gait appropriate for age and unassisted Psych--  Cognition and judgment appear intact.  Cooperative with normal attention span and concentration.  Behavior appropriate. No anxious or depressed appearing.      Assessment     Assessment (transfer from Dr Linna Darner 02-2016)  DM HTN Hyperlipidemia Frequent PVCs versus trigeminy (resolved after hypomagnesemia treatment) Hypomagnesemia: Not on PPIs on diuretics. 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 +FH CAD  PLAN: Diarrhea: As described above, patient thinks could be from metformin, I agree, that is possible. Plan: Stop metformin for 2 weeks and then restart at a lower dose, see AVS. DM: Well-controlled on Actos, Januvia and metformin.  See above.  Check A1c Mild anemia: Hemoglobin has trended down, check iron, ferritin and CBC of note this, he has never had a colonoscopy.  Further advised with results. Preventive care: Vaccine advice provided. RTC 3 m

## 2022-04-06 ENCOUNTER — Ambulatory Visit
Admission: RE | Admit: 2022-04-06 | Discharge: 2022-04-06 | Disposition: A | Discharge status: Home / Self Care | Payer: Medicare Other | Source: Ambulatory Visit | Attending: Urgent Care | Admitting: Urgent Care

## 2022-04-06 ENCOUNTER — Other Ambulatory Visit: Payer: Self-pay

## 2022-04-06 VITALS — BP 171/73 | HR 65 | Temp 97.6°F | Resp 18

## 2022-04-06 DIAGNOSIS — L299 Pruritus, unspecified: Secondary | ICD-10-CM

## 2022-04-06 DIAGNOSIS — L259 Unspecified contact dermatitis, unspecified cause: Secondary | ICD-10-CM

## 2022-04-06 DIAGNOSIS — R21 Rash and other nonspecific skin eruption: Secondary | ICD-10-CM | POA: Diagnosis not present

## 2022-04-06 DIAGNOSIS — E119 Type 2 diabetes mellitus without complications: Secondary | ICD-10-CM | POA: Diagnosis not present

## 2022-04-06 MED ORDER — CEPHALEXIN 500 MG PO CAPS: 500.0000 mg | ORAL_CAPSULE | Freq: Three times a day (TID) | ORAL | 0 refills | Status: DC

## 2022-04-06 MED ORDER — BETAMETHASONE DIPROPIONATE 0.05 % EX OINT: TOPICAL_OINTMENT | Freq: Two times a day (BID) | CUTANEOUS | 0 refills | Status: DC

## 2022-04-06 NOTE — ED Triage Notes (Signed)
Pt reports rash to right forearm and elbow. Pt reports site started out as "two places that looked like mosquito bites" pt reports rash has progressively spread throughout arm and reports burning and itching sensation. Has tried benadryl and hydrocortisone cream.

## 2022-04-06 NOTE — ED Provider Notes (Signed)
Alex Church   MRN: [id number] DOB: 03/23/1946  Subjective:   Alex Church is a 76 y.o. male presenting for 10 to 14-day history of rash over the right forearm.  Patient states that it started out with 2 spots that were itchy.  He left it alone and over time it progressed spreading to the rest of his arm.  Has used Benadryl and hydrocortisone without any improvement.  Denies fever, tenderness, drainage, hot sensation.  Patient works outdoors regularly, [place] and has exposure to many kinds of plants and animals.  He cannot recall of any particular tick bite.  Denies headache, joint pains, throat pain, fatigue, cough, nausea, vomiting.  No new medications.  No current facility-administered medications for this encounter.  Current Outpatient Medications:    allopurinol (ZYLOPRIM) 100 MG tablet, Take 1 tablet (100 mg total) by mouth 2 (two) times daily., Disp: 60 tablet, Rfl: 1   atorvastatin (LIPITOR) 10 MG tablet, Take 1 tablet (10 mg total) by mouth at bedtime., Disp: 90 tablet, Rfl: 0   Blood Glucose Monitoring Suppl (ONETOUCH VERIO FLEX SYSTEM) w/Device KIT, Check blood sugar once daily, Disp: 1 kit, Rfl: 0   Calcium Carbonate-Vitamin D (CALCIUM + D PO), Take 1,000 mg by mouth daily., Disp: , Rfl:    carvedilol (COREG) 12.5 MG tablet, Take 1 tablet (12.5 mg total) by mouth 2 (two) times daily with a meal., Disp: 180 tablet, Rfl: 0   cloNIDine (CATAPRES) 0.2 MG tablet, Take 1 tablet (0.2 mg total) by mouth 2 (two) times daily., Disp: 180 tablet, Rfl: 1   glucose blood test strip, Check blood sugars once daily, Disp: 100 each, Rfl: 12   JANUVIA 100 MG tablet, Take 1 tablet (100 mg total) by mouth daily., Disp: 90 tablet, Rfl: 0   lisinopril (ZESTRIL) 20 MG tablet, Take 1 tablet (20 mg total) by mouth 2 (two) times daily., Disp: 180 tablet, Rfl: 1   Magnesium Oxide 400 MG CAPS, Take 2 capsules (800 mg total) by mouth 2 (two) times daily., Disp: 60 capsule, Rfl: 0    metFORMIN (GLUCOPHAGE) 1000 MG tablet, Take 0.5 tablets (500 mg total) by mouth 2 (two) times daily with a meal., Disp: , Rfl:    Multiple Vitamin (MULTIVITAMIN) tablet, Take 1 tablet by mouth daily., Disp: , Rfl:    ONE TOUCH LANCETS MISC, Check blood sugar once daily, Disp: 100 each, Rfl: 12   pioglitazone (ACTOS) 30 MG tablet, Take 1 tablet (30 mg total) by mouth daily., Disp: 90 tablet, Rfl: 0   No Known Allergies  Past Medical History:  Diagnosis Date   B12 deficiency    monthly shots   Diabetes mellitus    Glaucoma suspect    Hyperlipemia    Hypertension      Past Surgical History:  Procedure Laterality Date   NO PAST SURGERIES      Family History  Problem Relation Age of Onset   Diabetes Maternal Uncle    Heart attack Brother 39   Cancer Mother        intra-abdominal   Cirrhosis Cousin        maternal   Diabetes Cousin        maternal   Stroke Neg Hx    Colon cancer Neg Hx    Prostate cancer Neg Hx     Social History   Tobacco Use   Smoking status: Never   Smokeless tobacco: Never  Substance Use Topics   Alcohol use:  No   Drug use: No    ROS   Objective:   Vitals: BP (!) 171/73 (BP Location: Right Arm)   Pulse 65   Temp 97.6 F (36.4 C) (Oral)   Resp 18   SpO2 96%   Physical Exam Constitutional:      General: He is not in acute distress.    Appearance: Normal appearance. He is well-developed and normal weight. He is not ill-appearing, toxic-appearing or diaphoretic.  HENT:     Head: Normocephalic and atraumatic.     Right Ear: External ear normal.     Left Ear: External ear normal.     Nose: Nose normal.     Mouth/Throat:     Pharynx: Oropharynx is clear.  Eyes:     General: No scleral icterus.       Right eye: No discharge.        Left eye: No discharge.     Extraocular Movements: Extraocular movements intact.  Cardiovascular:     Rate and Rhythm: Normal rate.  Pulmonary:     Effort: Pulmonary effort is normal.  Musculoskeletal:      Cervical back: Normal range of motion.  Skin:    Findings: Rash (Maculopapular rash over the right forearm as depicted) present.  Neurological:     Mental Status: He is alert and oriented to person, place, and time.  Psychiatric:        Mood and Affect: Mood normal.        Behavior: Behavior normal.        Thought Content: Thought content normal.        Judgment: Judgment normal.       Assessment and Plan :   PDMP not reviewed this encounter.  1. Contact dermatitis, unspecified contact dermatitis type, unspecified trigger   2. Itching   3. Rash and nonspecific skin eruption   4. Type 2 diabetes mellitus treated without insulin (HCC)    Suspected irritant contact dermatitis and recommended topical steroid treatment.  We will use Keflex for a possible secondary cellulitis.  Labs pending. Counseled patient on potential for adverse effects with medications prescribed/recommended today, ER and return-to-clinic precautions discussed, patient verbalized understanding.    Jaynee Eagles, [place] 04/06/22 1044

## 2022-04-15 LAB — CBC WITH DIFFERENTIAL/PLATELET
Basophils Absolute: 0.1 10*3/uL (ref 0.0–0.2)
Basos: 1 %
EOS (ABSOLUTE): 0.3 10*3/uL (ref 0.0–0.4)
Eos: 4 %
Hematocrit: 38.7 % (ref 37.5–51.0)
Hemoglobin: 12.8 g/dL — ABNORMAL LOW (ref 13.0–17.7)
Immature Grans (Abs): 0 10*3/uL (ref 0.0–0.1)
Immature Granulocytes: 0 %
Lymphocytes Absolute: 1.5 10*3/uL (ref 0.7–3.1)
Lymphs: 21 %
MCH: 29.8 pg (ref 26.6–33.0)
MCHC: 33.1 g/dL (ref 31.5–35.7)
MCV: 90 fL (ref 79–97)
Monocytes Absolute: 0.6 10*3/uL (ref 0.1–0.9)
Monocytes: 8 %
Neutrophils Absolute: 5 10*3/uL (ref 1.4–7.0)
Neutrophils: 66 %
Platelets: 184 10*3/uL (ref 150–450)
RBC: 4.3 x10E6/uL (ref 4.14–5.80)
RDW: 13 % (ref 11.6–15.4)
WBC: 7.5 10*3/uL (ref 3.4–10.8)

## 2022-04-15 LAB — EHRLICHIA ANTIBODY PANEL
E. Chaffeensis (HME) IgM Titer: NEGATIVE
E.Chaffeensis (HME) IgG: NEGATIVE
HGE IgG Titer: NEGATIVE
HGE IgM Titer: NEGATIVE

## 2022-04-15 LAB — ROCKY MTN SPOTTED FVR ABS PNL(IGG+IGM)
RMSF IgG: NEGATIVE
RMSF IgM: 0.26 index (ref 0.00–0.89)

## 2022-04-15 LAB — BASIC METABOLIC PANEL
BUN/Creatinine Ratio: 14 (ref 10–24)
BUN: 17 mg/dL (ref 8–27)
CO2: 22 mmol/L (ref 20–29)
Calcium: 9.4 mg/dL (ref 8.6–10.2)
Chloride: 100 mmol/L (ref 96–106)
Creatinine, Ser: 1.21 mg/dL (ref 0.76–1.27)
Glucose: 142 mg/dL — ABNORMAL HIGH (ref 70–99)
Potassium: 4.5 mmol/L (ref 3.5–5.2)
Sodium: 139 mmol/L (ref 134–144)
eGFR: 62 mL/min/{1.73_m2} (ref >59)

## 2022-04-15 LAB — LYME DISEASE SEROLOGY W/REFLEX: Lyme Total Antibody EIA: NEGATIVE

## 2022-04-17 ENCOUNTER — Other Ambulatory Visit (HOSPITAL_COMMUNITY): Payer: Self-pay

## 2022-04-27 ENCOUNTER — Other Ambulatory Visit: Payer: Self-pay | Admitting: Internal Medicine

## 2022-04-28 ENCOUNTER — Other Ambulatory Visit (HOSPITAL_COMMUNITY): Payer: Self-pay

## 2022-04-28 MED ORDER — PIOGLITAZONE HCL 30 MG PO TABS
30.0000 mg | ORAL_TABLET | Freq: Every day | ORAL | 0 refills | Status: DC
  Filled 2022-04-28: qty 90, 90d supply, fill #0

## 2022-04-28 MED ORDER — LISINOPRIL 20 MG PO TABS
20.0000 mg | ORAL_TABLET | Freq: Two times a day (BID) | ORAL | 1 refills | Status: DC
  Filled 2022-04-28: qty 180, 90d supply, fill #0
  Filled 2022-08-04: qty 180, 90d supply, fill #1

## 2022-04-28 MED ORDER — JANUVIA 100 MG PO TABS
100.0000 mg | ORAL_TABLET | Freq: Every day | ORAL | 0 refills | Status: DC
  Filled 2022-04-28: qty 90, 90d supply, fill #0
  Filled 2022-04-28: qty 30, 30d supply, fill #0
  Filled 2022-06-02: qty 30, 30d supply, fill #1
  Filled 2022-07-01: qty 30, 30d supply, fill #2

## 2022-05-26 ENCOUNTER — Other Ambulatory Visit (HOSPITAL_COMMUNITY): Payer: Self-pay

## 2022-05-26 ENCOUNTER — Other Ambulatory Visit: Payer: Self-pay | Admitting: Internal Medicine

## 2022-05-26 MED ORDER — ALLOPURINOL 100 MG PO TABS
100.0000 mg | ORAL_TABLET | Freq: Two times a day (BID) | ORAL | 1 refills | Status: DC
  Filled 2022-05-26: qty 144, 72d supply, fill #0
  Filled 2022-05-26: qty 36, 18d supply, fill #0
  Filled 2022-08-24: qty 180, 90d supply, fill #1

## 2022-06-02 ENCOUNTER — Other Ambulatory Visit: Payer: Self-pay | Admitting: Internal Medicine

## 2022-06-02 ENCOUNTER — Other Ambulatory Visit (HOSPITAL_COMMUNITY): Payer: Self-pay

## 2022-06-02 MED ORDER — METFORMIN HCL 1000 MG PO TABS
500.0000 mg | ORAL_TABLET | Freq: Two times a day (BID) | ORAL | 1 refills | Status: DC
  Filled 2022-06-02: qty 90, 90d supply, fill #0
  Filled 2022-07-20: qty 90, 90d supply, fill #1

## 2022-06-10 ENCOUNTER — Other Ambulatory Visit: Payer: Self-pay | Admitting: Internal Medicine

## 2022-06-11 ENCOUNTER — Other Ambulatory Visit (HOSPITAL_COMMUNITY): Payer: Self-pay

## 2022-06-11 MED ORDER — ATORVASTATIN CALCIUM 10 MG PO TABS
10.0000 mg | ORAL_TABLET | Freq: Every day | ORAL | 0 refills | Status: DC
  Filled 2022-06-11: qty 90, 90d supply, fill #0

## 2022-06-11 MED ORDER — CARVEDILOL 12.5 MG PO TABS
12.5000 mg | ORAL_TABLET | Freq: Two times a day (BID) | ORAL | 0 refills | Status: DC
  Filled 2022-06-11: qty 180, 90d supply, fill #0

## 2022-06-27 ENCOUNTER — Ambulatory Visit: Payer: Medicare Other | Admitting: Internal Medicine

## 2022-07-02 ENCOUNTER — Other Ambulatory Visit (HOSPITAL_COMMUNITY): Payer: Self-pay

## 2022-07-21 ENCOUNTER — Other Ambulatory Visit (HOSPITAL_COMMUNITY): Payer: Self-pay

## 2022-07-29 ENCOUNTER — Other Ambulatory Visit (HOSPITAL_COMMUNITY): Payer: Self-pay

## 2022-07-29 ENCOUNTER — Other Ambulatory Visit: Payer: Self-pay | Admitting: Internal Medicine

## 2022-07-29 MED ORDER — JANUVIA 100 MG PO TABS
100.0000 mg | ORAL_TABLET | Freq: Every day | ORAL | 0 refills | Status: DC
  Filled 2022-07-29: qty 90, 90d supply, fill #0
  Filled 2022-07-29: qty 30, 30d supply, fill #0
  Filled 2022-09-02: qty 30, 30d supply, fill #1
  Filled 2022-09-29: qty 30, 30d supply, fill #2

## 2022-08-03 ENCOUNTER — Other Ambulatory Visit: Payer: Self-pay | Admitting: Family

## 2022-08-04 ENCOUNTER — Other Ambulatory Visit: Payer: Self-pay | Admitting: Internal Medicine

## 2022-08-04 ENCOUNTER — Other Ambulatory Visit (HOSPITAL_COMMUNITY): Payer: Self-pay

## 2022-08-04 MED ORDER — PIOGLITAZONE HCL 30 MG PO TABS
30.0000 mg | ORAL_TABLET | Freq: Every day | ORAL | 1 refills | Status: DC
  Filled 2022-08-04: qty 90, 90d supply, fill #0
  Filled 2022-11-04: qty 90, 90d supply, fill #1

## 2022-08-05 ENCOUNTER — Ambulatory Visit: Payer: Medicare Other | Admitting: Internal Medicine

## 2022-08-12 ENCOUNTER — Ambulatory Visit
Admission: RE | Admit: 2022-08-12 | Discharge: 2022-08-12 | Disposition: A | Discharge status: Home / Self Care | Payer: Medicare Other | Source: Ambulatory Visit | Attending: Nurse Practitioner | Admitting: Nurse Practitioner

## 2022-08-12 ENCOUNTER — Other Ambulatory Visit: Payer: Self-pay

## 2022-08-12 ENCOUNTER — Other Ambulatory Visit (HOSPITAL_COMMUNITY): Payer: Self-pay

## 2022-08-12 VITALS — BP 166/88 | HR 66 | Temp 97.9°F | Resp 20

## 2022-08-12 DIAGNOSIS — J329 Chronic sinusitis, unspecified: Secondary | ICD-10-CM

## 2022-08-12 DIAGNOSIS — B9689 Other specified bacterial agents as the cause of diseases classified elsewhere: Secondary | ICD-10-CM

## 2022-08-12 MED ORDER — AMOXICILLIN-POT CLAVULANATE 875-125 MG PO TABS
1.0000 | ORAL_TABLET | Freq: Two times a day (BID) | ORAL | 0 refills | Status: AC
  Filled 2022-08-12: qty 14, 7d supply, fill #0

## 2022-08-12 NOTE — ED Triage Notes (Signed)
Pt reports cough and nasal/chest congestion for last several days. Has taken mucinex otc with no change in symptoms.

## 2022-08-12 NOTE — ED Provider Notes (Signed)
[facility]    CSN: [id number] Arrival date & time: 08/12/22  1318      History   Chief Complaint Chief Complaint  Patient presents with   Cough    HPI Alex Church is a 76 y.o. male.   Patient presents for 10 days of congested cough, nasal congestion, runny nose, sneezing, sinus pressure, bilateral ear pressure, and fatigue.  He denies fever, shortness of breath or chest pain, chest congestion, postnasal drainage, sore throat, headache, ear pain pain, abdominal pain, nausea/vomiting, diarrhea, decreased appetite, and loss of taste or smell.  Reports he has been taking/trying saline rinses and Mucinex without much relief.  Her wife is being seen today for similar symptoms, however she has been sick longer.  Patient is surprised by elevated blood pressure today.  Reports when he checks it at home, it is usually 120s 130s over 80s.  Wonders if elevated blood pressure may be coming from over-the-counter medications he has been taking.  He denies chest pain, shortness of breath, vision changes, headache, lightheadedness/dizziness, lower extremity swelling today.    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  Authorizing Provider  amoxicillin-clavulanate (AUGMENTIN) 875-125 MG tablet Take 1 tablet by mouth 2 (two) times daily for 7 days. 08/12/22 08/19/22 Yes Eulogio Bear, NP  allopurinol (ZYLOPRIM) 100 MG tablet Take 1 tablet (100 mg total) by mouth 2 (two) times daily. 05/26/22   Colon Branch, MD  atorvastatin (LIPITOR) 10 MG tablet Take 1 tablet (10 mg total) by mouth at bedtime. 06/11/22   Colon Branch, MD  betamethasone dipropionate (DIPROLENE) 0.05 % ointment Apply topically 2 (two) times daily. 04/06/22   Jaynee Eagles, PA-C  Blood Glucose Monitoring Suppl ([place]) w/Device KIT Check blood sugar once daily 05/24/18   Colon Branch, MD  Calcium Carbonate-Vitamin D (CALCIUM + D PO) Take 1,000 mg by mouth daily.    [provider]  carvedilol (COREG) 12.5 MG tablet Take 1 tablet (12.5 mg total) by mouth 2 (two) times daily with a meal. 06/11/22   Colon Branch, MD  cloNIDine (CATAPRES) 0.2 MG tablet Take 1 tablet (0.2 mg total) by mouth 2 (two) times daily. 09/10/21   Colon Branch, MD  glucose blood test strip Check blood sugars once daily 04/11/20   Colon Branch, MD  JANUVIA 100 MG tablet Take 1 tablet (100 mg total) by mouth daily. 07/29/22   Colon Branch, MD  lisinopril (ZESTRIL) 20 MG tablet Take 1 tablet (20 mg total) by mouth 2 (two) times daily. 04/28/22   Kennyth Arnold, FNP  Magnesium Oxide  400 MG CAPS Take 2 capsules (800 mg total) by mouth 2 (two) times daily. 08/18/18   Colon Branch, MD  metFORMIN (GLUCOPHAGE) 1000 MG tablet Take 0.5 tablets (500 mg total) by mouth 2 (two) times daily with a meal. 06/02/22   Colon Branch, MD  Multiple Vitamin (MULTIVITAMIN) tablet Take 1 tablet by mouth daily.    [provider]  ONE TOUCH LANCETS MISC Check blood sugar once daily 05/24/18   Colon Branch, MD  pioglitazone (ACTOS) 30 MG tablet Take 1 tablet (30 mg total) by mouth daily. 08/04/22   Colon Branch, MD    Family History Family History  Problem Relation Age of Onset    Diabetes Maternal Uncle    Heart attack Brother 18   Cancer Mother        intra-abdominal   Cirrhosis Cousin        maternal   Diabetes Cousin        maternal   Stroke Neg Hx    Colon cancer Neg Hx    Prostate cancer Neg Hx     Social History Social History   Tobacco Use   Smoking status: Never   Smokeless tobacco: Never  Substance Use Topics   Alcohol use: No   Drug use: No     Allergies   Patient has no known allergies.   Review of Systems Review of Systems Per HPI  Physical Exam Triage Vital Signs ED Triage Vitals  Enc Vitals Group     BP 08/12/22 1433 (!) 166/88     Pulse Rate 08/12/22 1433 66     Resp 08/12/22 1433 20     Temp 08/12/22 1433 97.9 F (36.6 C)     Temp Source 08/12/22 1433 Oral     SpO2 08/12/22 1433 96 %     Weight --      Height --      Head Circumference --      Peak Flow --      Pain Score 08/12/22 1431 0     Pain Loc --      Pain Edu? --      Excl. in [place]? --    No data found.  Updated Vital Signs BP (!) 166/88 (BP Location: Right Arm)   Pulse 66   Temp 97.9 F (36.6 C) (Oral)   Resp 20   SpO2 96%   Visual Acuity Right Eye Distance:   Left Eye Distance:   Bilateral Distance:    Right Eye Near:   Left Eye Near:    Bilateral Near:     Physical Exam Vitals and nursing note reviewed.  Constitutional:      General: He is not in acute distress.    Appearance: Normal appearance. He is not ill-appearing or toxic-appearing.  HENT:     Head: Normocephalic and atraumatic.     Right Ear: Tympanic membrane, ear canal and external ear normal.     Left Ear: Tympanic membrane, ear canal and external ear normal.     Nose: Congestion and rhinorrhea present.     Right Sinus: No maxillary sinus tenderness or frontal sinus tenderness.     Left Sinus: No maxillary sinus tenderness or frontal sinus tenderness.     Mouth/Throat:     Mouth: Mucous membranes are moist.     Pharynx: Oropharynx is clear. Posterior oropharyngeal erythema  present. No oropharyngeal exudate.  Eyes:     General: No scleral icterus.  Extraocular Movements: Extraocular movements intact.  Cardiovascular:     Rate and Rhythm: Normal rate and regular rhythm.  Pulmonary:     Effort: Pulmonary effort is normal. No respiratory distress.     Breath sounds: Normal breath sounds. No wheezing, rhonchi or rales.  Abdominal:     General: Abdomen is flat. Bowel sounds are normal. There is no distension.     Palpations: Abdomen is soft.  Musculoskeletal:     Cervical back: Normal range of motion and neck supple.  Lymphadenopathy:     Cervical: No cervical adenopathy.  Skin:    General: Skin is warm and dry.     Coloration: Skin is not jaundiced or pale.     Findings: No erythema or rash.  Neurological:     Mental Status: He is alert and oriented to person, place, and time.  Psychiatric:        Behavior: Behavior is cooperative.      UC Treatments / Results  Labs (all labs ordered are listed, but only abnormal results are displayed) Labs Reviewed - No data to display  EKG   Radiology No results found.  Procedures Procedures (including critical care time)  Medications Ordered in UC Medications - No data to display  Initial Impression / Assessment and Plan / UC Course  I have reviewed the triage vital signs and the nursing notes.  Pertinent labs & imaging results that were available during my care of the patient were reviewed by me and considered in my medical decision making (see chart for details).   Patient is well-appearing, afebrile, not tachycardic, not tachypneic, oxygenating well on room air.  He is mildly hypertensive today, likely secondary to over-the-counter medication use.  Bacterial sinusitis Treat with Augmentin twice daily for 7 days Supportive care discussed Continue saline rinses, Mucinex Discussed follow-up in ER precautions  The patient was given the opportunity to ask questions.  All questions answered to  their satisfaction.  The patient is in agreement to this plan.    Final Clinical Impressions(s) / UC Diagnoses   Final diagnoses:  Bacterial sinusitis     Discharge Instructions      I believe you have a sinus infection. Start the Augmentin and continue the guaifenesin.  Follow up with [place] or PCP if no better with treatment.     ED Prescriptions     Medication Sig Dispense Auth. Provider   amoxicillin-clavulanate (AUGMENTIN) 875-125 MG tablet Take 1 tablet by mouth 2 (two) times daily for 7 days. 14 tablet Eulogio Bear, NP      PDMP not reviewed this encounter.   Eulogio Bear, NP 08/12/22 [phone]

## 2022-08-12 NOTE — Discharge Instructions (Signed)
I believe you have a sinus infection. Start the Augmentin and continue the guaifenesin.  Follow up with [place] or PCP if no better with treatment.

## 2022-08-26 ENCOUNTER — Ambulatory Visit (INDEPENDENT_AMBULATORY_CARE_PROVIDER_SITE_OTHER): Payer: Medicare Other | Admitting: Internal Medicine

## 2022-08-26 ENCOUNTER — Encounter: Payer: Self-pay | Admitting: Internal Medicine

## 2022-08-26 VITALS — BP 134/84 | HR 64 | Temp 98.0°F | Resp 16 | Ht 72.0 in | Wt 199.1 lb

## 2022-08-26 DIAGNOSIS — E1169 Type 2 diabetes mellitus with other specified complication: Secondary | ICD-10-CM | POA: Diagnosis not present

## 2022-08-26 DIAGNOSIS — M1A9XX1 Chronic gout, unspecified, with tophus (tophi): Secondary | ICD-10-CM

## 2022-08-26 DIAGNOSIS — I10 Essential (primary) hypertension: Secondary | ICD-10-CM | POA: Diagnosis not present

## 2022-08-26 DIAGNOSIS — E538 Deficiency of other specified B group vitamins: Secondary | ICD-10-CM | POA: Diagnosis not present

## 2022-08-26 DIAGNOSIS — I493 Ventricular premature depolarization: Secondary | ICD-10-CM

## 2022-08-26 DIAGNOSIS — Z23 Encounter for immunization: Secondary | ICD-10-CM | POA: Diagnosis not present

## 2022-08-26 NOTE — Progress Notes (Unsigned)
Subjective:    Patient ID: Alex Church, male    DOB: 02/20/1946, 76 y.o.   MRN: [id number]  DOS:  08/26/2022 Type of visit - description: f/u  Since the last office visit is doing well. He was having diarrhea frequently, he stopped some of his vitamins and now is much improved. Still taking metformin and ambulatory CBGs are pretty good. Ambulatory BPs in the 120s. Recently had a moderate sinus and chest infection, was Rx Augmentin and doing better.  Denies chest pain no difficulty breathing No palpitations.  Review of Systems See above   Past Medical History:  Diagnosis Date   B12 deficiency    monthly shots   Diabetes mellitus    Glaucoma suspect    Hyperlipemia    Hypertension     Past Surgical History:  Procedure Laterality Date   NO PAST SURGERIES      Current Outpatient Medications  Medication Instructions   allopurinol (ZYLOPRIM) 100 mg, Oral, 2 times daily   atorvastatin (LIPITOR) 10 mg, Oral, Daily at bedtime   Blood Glucose Monitoring Suppl ([place]) w/Device KIT Check blood sugar once daily   carvedilol (COREG) 12.5 mg, Oral, 2 times daily with meals   cloNIDine (CATAPRES) 0.2 mg, Oral, 2 times daily   cyanocobalamin (VITAMIN B12) 1,000 mcg, Oral, Every other day   glucose blood test strip Check blood sugars once daily   Januvia 100 mg, Oral, Daily   lisinopril (ZESTRIL) 20 mg, Oral, 2 times daily   Magnesium Oxide 800 mg, Oral, 2 times daily   metFORMIN (GLUCOPHAGE) 500 mg, Oral, 2 times daily with meals   Multiple Vitamin (MULTIVITAMIN) tablet 1 tablet, Oral, Daily,     ONE TOUCH LANCETS MISC Check blood sugar once daily   pioglitazone (ACTOS) 30 mg, Oral, Daily       Objective:   Physical Exam BP 134/84   Pulse 64   Temp 98 F (36.7 C) (Oral)   Resp 16   Ht 6' (1.829 m)   Wt 199 lb 2 oz (90.3 kg)   SpO2 93%   BMI 27.01 kg/m  General:   Well developed, NAD, BMI noted. HEENT:  Normocephalic . Face symmetric,  atraumatic Lungs:  CTA B Normal respiratory effort, no intercostal retractions, no accessory muscle use. Heart: RRR,  no murmur.  Lower extremities: no pretibial edema bilaterally  Skin: Not pale. Not jaundice Neurologic:  alert & oriented X3.  Speech normal, gait appropriate for age and unassisted Psych--  Cognition and judgment appear intact.  Cooperative with normal attention span and concentration.  Behavior appropriate. No anxious or depressed appearing.      Assessment    Assessment (transfer from Dr Linna Darner 02-2016)  DM HTN Hyperlipidemia Frequent PVCs versus trigeminy (resolved after hypomagnesemia treatment) Hypomagnesemia: Not on PPIs on diuretics. 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 +FH CAD  PLAN: DM: See last visit, had diarrhea, it improved after he stopped some of the vitamins he was taking.  Plan: Continue Januvia, metformin, pioglitazone, check A1c and micro. HTN: Reports ambulatory BPs are great, continue carvedilol, clonidine, lisinopril.  Check BMP. Hyperlipidemia: On Lipitor, checking a FLP- LFTs further advised with results. Frequent PVCs: On magnesium, checking levels Tophaceous gout, on allopurinol, he wonders if he should continue with it, I do think he needs it, checking a uric acid and LFTs Diarrhea:  See last visit, symptoms have improved after he quit some of his vitamins (vitamin D, still taking B12) B12 deficiency: On supplements, last levels very good. Preventive care: PNM 20 discussed Shingrix discussed COVID VAX- rec booster  Flu shot today RTC CPX 3 months

## 2022-08-26 NOTE — Patient Instructions (Addendum)
Vaccines I recommend:  Shingrix (shingles) Covid booster RSV vaccine Pneumonia shot (PNM 20)  Check the  blood pressure regularly BP GOAL is between 110/65 and  135/85. If it is consistently higher or lower, let me know     GO TO THE LAB : Get the blood work     [place], Hiko back for   a physical exam in 3 months     Please drop off a copy of your Healthcare Power of Attorney for your chart.

## 2022-08-27 DIAGNOSIS — I493 Ventricular premature depolarization: Secondary | ICD-10-CM | POA: Insufficient documentation

## 2022-08-27 LAB — LIPID PANEL
Cholesterol: 121 mg/dL (ref 0–200)
HDL: 45.2 mg/dL (ref >39.00)
LDL Cholesterol: 55 mg/dL (ref 0–99)
NonHDL: 75.32
Total CHOL/HDL Ratio: 3
Triglycerides: 101 mg/dL (ref 0.0–149.0)
VLDL: 20.2 mg/dL (ref 0.0–40.0)

## 2022-08-27 LAB — MICROALBUMIN / CREATININE URINE RATIO
Creatinine,U: 55.9 mg/dL
Microalb Creat Ratio: 1.3 mg/g (ref 0.0–30.0)
Microalb, Ur: <0.7 mg/dL (ref 0.0–1.9)

## 2022-08-27 LAB — COMPREHENSIVE METABOLIC PANEL
ALT: 8 U/L (ref 0–53)
AST: 12 U/L (ref 0–37)
Albumin: 4.1 g/dL (ref 3.5–5.2)
Alkaline Phosphatase: 77 U/L (ref 39–117)
BUN: 20 mg/dL (ref 6–23)
CO2: 28 mEq/L (ref 19–32)
Calcium: 9.5 mg/dL (ref 8.4–10.5)
Chloride: 100 mEq/L (ref 96–112)
Creatinine, Ser: 1.15 mg/dL (ref 0.40–1.50)
GFR: 61.92 mL/min (ref >60.00)
Glucose, Bld: 114 mg/dL — ABNORMAL HIGH (ref 70–99)
Potassium: 4.5 mEq/L (ref 3.5–5.1)
Sodium: 137 mEq/L (ref 135–145)
Total Bilirubin: 0.5 mg/dL (ref 0.2–1.2)
Total Protein: 7 g/dL (ref 6.0–8.3)

## 2022-08-27 LAB — HEMOGLOBIN A1C: Hgb A1c MFr Bld: 7 % — ABNORMAL HIGH (ref 4.6–6.5)

## 2022-08-27 LAB — URIC ACID: Uric Acid, Serum: 5.1 mg/dL (ref 4.0–7.8)

## 2022-08-27 LAB — MAGNESIUM: Magnesium: 1.5 mg/dL (ref 1.5–2.5)

## 2022-08-27 NOTE — Assessment & Plan Note (Signed)
DM: See last visit, had diarrhea, it improved after he stopped some of the vitamins he was taking.  Plan: Continue Januvia, metformin, pioglitazone, check A1c and micro. HTN: Reports ambulatory BPs are great, continue carvedilol, clonidine, lisinopril.  Check BMP. Hyperlipidemia: On Lipitor, checking a FLP- LFTs further advised with results. Frequent PVCs: On magnesium, checking levels Tophaceous gout, on allopurinol, he wonders if he should continue with it, I do think he needs it, checking a uric acid and LFTs Diarrhea: See last visit, symptoms have improved after he quit some of his vitamins (vitamin D, still taking B12) B12 deficiency: On supplements, last levels very good. Preventive care: PNM 20 discussed Shingrix discussed COVID VAX- rec booster  Flu shot today RTC CPX 3 months

## 2022-09-02 ENCOUNTER — Other Ambulatory Visit: Payer: Self-pay | Admitting: Internal Medicine

## 2022-09-02 ENCOUNTER — Other Ambulatory Visit (HOSPITAL_COMMUNITY): Payer: Self-pay

## 2022-09-02 MED ORDER — METFORMIN HCL 1000 MG PO TABS
500.0000 mg | ORAL_TABLET | Freq: Two times a day (BID) | ORAL | 1 refills | Status: DC
  Filled 2022-09-02: qty 90, 90d supply, fill #0

## 2022-09-03 ENCOUNTER — Other Ambulatory Visit: Payer: Self-pay | Admitting: Internal Medicine

## 2022-09-03 ENCOUNTER — Other Ambulatory Visit (HOSPITAL_COMMUNITY): Payer: Self-pay

## 2022-09-03 ENCOUNTER — Encounter: Payer: Self-pay | Admitting: Internal Medicine

## 2022-09-03 MED ORDER — METFORMIN HCL 1000 MG PO TABS
500.0000 mg | ORAL_TABLET | Freq: Two times a day (BID) | ORAL | 1 refills | Status: DC
  Filled 2022-09-03: qty 90, 90d supply, fill #0

## 2022-09-03 MED ORDER — METFORMIN HCL 1000 MG PO TABS
1000.0000 mg | ORAL_TABLET | Freq: Two times a day (BID) | ORAL | 1 refills | Status: DC
  Filled 2022-09-03: qty 180, 90d supply, fill #0
  Filled 2022-11-25: qty 180, 90d supply, fill #1

## 2022-09-08 ENCOUNTER — Other Ambulatory Visit: Payer: Self-pay | Admitting: Internal Medicine

## 2022-09-09 ENCOUNTER — Other Ambulatory Visit (HOSPITAL_COMMUNITY): Payer: Self-pay

## 2022-09-09 MED ORDER — CARVEDILOL 12.5 MG PO TABS
12.5000 mg | ORAL_TABLET | Freq: Two times a day (BID) | ORAL | 1 refills | Status: DC
  Filled 2022-09-09: qty 180, 90d supply, fill #0
  Filled 2022-12-17: qty 180, 90d supply, fill #1

## 2022-09-09 MED ORDER — CLONIDINE HCL 0.2 MG PO TABS
0.2000 mg | ORAL_TABLET | Freq: Two times a day (BID) | ORAL | 1 refills | Status: DC
  Filled 2022-09-09: qty 180, 90d supply, fill #0
  Filled 2022-12-17: qty 180, 90d supply, fill #1

## 2022-09-16 ENCOUNTER — Other Ambulatory Visit (HOSPITAL_COMMUNITY): Payer: Self-pay

## 2022-09-17 ENCOUNTER — Other Ambulatory Visit (HOSPITAL_COMMUNITY): Payer: Self-pay

## 2022-09-18 ENCOUNTER — Other Ambulatory Visit (HOSPITAL_COMMUNITY): Payer: Self-pay

## 2022-09-18 ENCOUNTER — Other Ambulatory Visit: Payer: Self-pay | Admitting: Internal Medicine

## 2022-09-18 MED ORDER — ATORVASTATIN CALCIUM 10 MG PO TABS
10.0000 mg | ORAL_TABLET | Freq: Every day | ORAL | 1 refills | Status: DC
  Filled 2022-09-18: qty 90, 90d supply, fill #0
  Filled 2022-12-16: qty 90, 90d supply, fill #1

## 2022-09-23 ENCOUNTER — Other Ambulatory Visit (HOSPITAL_COMMUNITY): Payer: Self-pay

## 2022-10-13 ENCOUNTER — Ambulatory Visit: Payer: Medicare Other

## 2022-10-21 ENCOUNTER — Ambulatory Visit (INDEPENDENT_AMBULATORY_CARE_PROVIDER_SITE_OTHER): Payer: Medicare Other | Admitting: *Deleted

## 2022-10-21 VITALS — BP 152/84 | HR 72 | Ht 72.0 in | Wt 200.2 lb

## 2022-10-21 DIAGNOSIS — Z0000 Encounter for general adult medical examination without abnormal findings: Secondary | ICD-10-CM

## 2022-10-21 NOTE — Progress Notes (Signed)
Subjective:   Alex Church is a 77 y.o. male who presents for Medicare Annual/Subsequent preventive examination.  Review of Systems    Defer to PCP Cardiac Risk Factors include: advanced age (>30mn, >>25women);diabetes mellitus;dyslipidemia;male gender;hypertension     Objective:    Today's Vitals   10/21/22 1411 10/21/22 1451  BP: (!) 155/74 (!) 152/84  Pulse: 78 72  Weight: 200 lb 3.2 oz (90.8 kg)   Height: 6' (1.829 m)    Body mass index is 27.15 kg/m.     10/21/2022    2:13 PM 10/10/2021    2:27 PM 09/20/2020    2:23 PM 08/15/2019    2:57 PM 08/02/2018    4:08 PM  Advanced Directives  Does Patient Have a Medical Advance Directive? Yes Yes No No Yes  Type of [place]of [place] will [place] will   [place] will  Does patient want to make changes to medical advance directive? No - Patient declined      Copy of H[place] Chart? No - copy requested No - copy requested   No - copy requested  Would patient like information on creating a medical advance directive?   No - Patient declined No - Patient declined     Current Medications (verified) Outpatient Encounter Medications as of 10/21/2022  Medication Sig   atorvastatin (LIPITOR) 10 MG tablet Take 1 tablet (10 mg total) by mouth at bedtime.   allopurinol (ZYLOPRIM) 100 MG tablet Take 1 tablet (100 mg total) by mouth 2 (two) times daily.   Blood Glucose Monitoring Suppl (ONETOUCH VERIO FLEX SYSTEM) w/Device KIT Check blood sugar once daily   carvedilol (COREG) 12.5 MG tablet Take 1 tablet (12.5 mg total) by mouth 2 (two) times daily with a meal.   cloNIDine (CATAPRES) 0.2 MG tablet Take 1 tablet (0.2 mg total) by mouth 2 (two) times daily.   cyanocobalamin (VITAMIN B12) 1000 MCG tablet Take 1,000 mcg by mouth every other day.   glucose blood test strip Check blood sugars once daily   JANUVIA 100 MG tablet Take 1  tablet (100 mg total) by mouth daily.   lisinopril (ZESTRIL) 20 MG tablet Take 1 tablet (20 mg total) by mouth 2 (two) times daily.   Magnesium Oxide 400 MG CAPS Take 2 capsules (800 mg total) by mouth 2 (two) times daily.   metFORMIN (GLUCOPHAGE) 1000 MG tablet Take 1 tablet (1,000 mg total) by mouth 2 (two) times daily with a meal.   ONE TOUCH LANCETS MISC Check blood sugar once daily   pioglitazone (ACTOS) 30 MG tablet Take 1 tablet (30 mg total) by mouth daily.   [DISCONTINUED] Multiple Vitamin (MULTIVITAMIN) tablet Take 1 tablet by mouth daily.   No facility-administered encounter medications on file as of 10/21/2022.    Allergies (verified) Patient has no known allergies.   History: Past Medical History:  Diagnosis Date   B12 deficiency    monthly shots   Diabetes mellitus    Glaucoma suspect    Hyperlipemia    Hypertension    Past Surgical History:  Procedure Laterality Date   NO PAST SURGERIES     Family History  Problem Relation Age of Onset   Diabetes Maternal Uncle    Heart attack Brother 427  Cancer Mother        intra-abdominal   Cirrhosis Cousin        maternal   Diabetes Cousin  maternal   Stroke Neg Hx    Colon cancer Neg Hx    Prostate cancer Neg Hx    Social History   Socioeconomic History   Marital status: Married    Spouse name: Not on file   Number of children: 0   Years of education: Not on file   Highest education level: Not on file  Occupational History   Occupation: retired from [employer], works for a lab driving  Tobacco Use   Smoking status: Never   Smokeless tobacco: Never  Substance and Sexual Activity   Alcohol use: No   Drug use: No   Sexual activity: Not on file  Other Topics Concern   Not on file  Social History Narrative   Married (2nd marriage)   Lives w/ wife     Has a rescue dog   Social Determinants of Health   Financial Resource Strain: Low Risk  (10/21/2022)   Overall Financial Resource Strain (CARDIA)     Difficulty of Paying Living Expenses: Not hard at all  Food Insecurity: No Food Insecurity (10/21/2022)   Hunger Vital Sign    Worried About Running Out of Food in the Last Year: Never true    [place] in the Last Year: Never true  Transportation Needs: No Transportation Needs (10/21/2022)   PRAPARE - Hydrologist (Medical): No    Lack of Transportation (Non-Medical): No  Physical Activity: Sufficiently Active (10/10/2021)   Exercise Vital Sign    Days of Exercise per Week: 7 days    Minutes of Exercise per Session: 30 min  Stress: No Stress Concern Present (10/21/2022)   [place]    Feeling of Stress : Not at all  Social Connections: Moderately Isolated (10/10/2021)   Social Connection and Isolation Panel [NHANES]    Frequency of Communication with Friends and Family: More than three times a week    Frequency of Social Gatherings with Friends and Family: More than three times a week    Attends Religious Services: Never    Marine scientist or Organizations: No    Attends Music therapist: Never    Marital Status: Married    Tobacco Counseling Counseling given: Not Answered   Clinical Intake:  Pre-visit preparation completed: Yes  Pain : No/denies pain  Nutritional Risks: None Diabetes: Yes CBG done?: No Did pt. bring in CBG monitor from home?: No  How often do you need to have someone help you when you read instructions, pamphlets, or other written materials from your doctor or pharmacy?: 1 - Never   Activities of Daily Living    10/21/2022    2:26 PM  In your present state of health, do you have any difficulty performing the following activities:  Hearing? 0  Vision? 1  Comment cataract in right eye  Difficulty concentrating or making decisions? 0  Walking or climbing stairs? 0  Dressing or bathing? 0  Doing errands, shopping? 0  Preparing Food and  eating ? N  Using the Toilet? N  In the past six months, have you accidently leaked urine? N  Do you have problems with loss of bowel control? N  Managing your Medications? N  Managing your Finances? N  Housekeeping or managing your Housekeeping? N    Patient Care Team: Colon Branch, MD as PCP - General (Internal Medicine) Monna Fam, MD as Consulting Physician (Ophthalmology) Bjorn Pippin, PA-C  as Librarian, academic (Rheumatology)  Indicate any recent Medical Services you may have received from other than [facility] providers in the past year (date may be approximate).     Assessment:   This is a routine wellness examination for [place].  Hearing/Vision screen No results found.  Dietary issues and exercise activities discussed: Current Exercise Habits: Home exercise routine, Type of exercise: walking;strength training/weights, Time (Minutes): 40, Frequency (Times/Week): 3, Weekly Exercise (Minutes/Week): 120, Intensity: Mild, Exercise limited by: None identified   Goals Addressed   None    Depression Screen    10/21/2022    2:20 PM 08/26/2022    2:24 PM 03/28/2022    8:39 AM 10/10/2021    2:34 PM 08/30/2021   11:56 AM 01/11/2021   10:00 AM 09/20/2020    2:28 PM  PHQ 2/9 Scores  PHQ - 2 Score 0 0 0 0 0 0 0    Fall Risk    10/21/2022    2:14 PM 08/26/2022    2:23 PM 03/28/2022    8:39 AM 10/10/2021    2:31 PM 08/30/2021   11:56 AM  [place] in the past year? 0 0 0 0 0  Number falls in past yr: 0 0 0 0 0  Injury with Fall? 0 0 0 0 0  Risk for fall due to : No Fall Risks      Follow up Falls evaluation completed Falls evaluation completed Falls evaluation completed Falls prevention discussed Falls evaluation completed    [place]:  Any stairs in or around the home? Yes  If so, are there any without handrails? No  Home free of loose throw rugs in walkways, pet beds, electrical cords, etc? Yes  Adequate lighting in your  home to reduce risk of falls? Yes   ASSISTIVE DEVICES UTILIZED TO PREVENT FALLS:  Life alert? No  Use of a cane, walker or w/c? No  Grab bars in the bathroom? No  Shower chair or bench in shower? No  Elevated toilet seat or a handicapped toilet? No   TIMED UP AND GO:  Was the test performed? Yes .  Length of time to ambulate 10 feet: 6 sec.   Gait steady and fast without use of assistive device  Cognitive Function:    10/21/2022    2:50 PM  MMSE - Mini Mental State Exam  Not completed: Unable to complete        Immunizations Immunization History  Administered Date(s) Administered   Fluad Quad(high Dose 65+) 07/07/2019, 06/20/2020, 08/30/2021, 08/26/2022   Influenza Split 06/04/2012   Influenza, High Dose Seasonal PF 06/27/2015, 06/30/2016, 06/26/2017, 07/02/2018   Influenza,inj,Quad PF,6+ Mos 06/17/2013, 06/23/2014   PFIZER(Purple Top)SARS-COV-2 Vaccination 09/23/2019, 10/14/2019, 07/13/2020   Pfizer Covid-19 Vaccine Bivalent Booster 64yr & up 06/16/2021   Pneumococcal Conjugate-13 12/19/2016   Pneumococcal Polysaccharide-23 01/25/2018   Td 09/08/2008   Tdap 12/13/2019   Zoster, Live 03/18/2013    TDAP status: Up to date  Flu Vaccine status: Up to date  Pneumococcal vaccine status: Up to date  Covid-19 vaccine status: Information provided on how to obtain vaccines.   Qualifies for Shingles Vaccine? Yes   Zostavax completed Yes   Shingrix Completed?: No.    Education has been provided regarding the importance of this vaccine. Patient has been advised to call insurance company to determine out of pocket expense if they have not yet received this vaccine. Advised may also receive vaccine at local pharmacy  or Health Dept. Verbalized acceptance and understanding.  Screening Tests Health Maintenance  Topic Date Due   Zoster Vaccines- Shingrix (1 of 2) Never done   FOOT EXAM  06/20/2021   COVID-19 Vaccine (5 - 2023-24 season) 05/09/2022   Medicare Annual Wellness  (AWV)  10/10/2022   HEMOGLOBIN A1C  02/25/2023   OPHTHALMOLOGY EXAM  03/28/2023   Diabetic kidney evaluation - eGFR measurement  08/27/2023   Diabetic kidney evaluation - Urine ACR  08/27/2023   DTaP/Tdap/Td (3 - Td or Tdap) 12/12/2029   Pneumonia Vaccine 2+ Years old  Completed   INFLUENZA VACCINE  Completed   Hepatitis C Screening  Completed   HPV VACCINES  Aged Out    Health Maintenance  Health Maintenance Due  Topic Date Due   Zoster Vaccines- Shingrix (1 of 2) Never done   FOOT EXAM  06/20/2021   COVID-19 Vaccine (5 - 2023-24 season) 05/09/2022   Medicare Annual Wellness (AWV)  10/10/2022    Colorectal cancer screening: No longer required.   Lung Cancer Screening: (Low Dose CT Chest recommended if Age 42-80 years, 30 pack-year currently smoking OR have quit w/in 15years.) does not qualify.   Additional Screening:  Hepatitis C Screening: does qualify; Completed 12/19/16  Vision Screening: Recommended annual ophthalmology exams for early detection of glaucoma and other disorders of the eye. Is the patient up to date with their annual eye exam?  Yes  Who is the provider or what is the name of the office in which the patient attends annual eye exams? [facility]. If pt is not established with a provider, would they like to be referred to a provider to establish care? No .   Dental Screening: Recommended annual dental exams for proper oral hygiene  Community Resource Referral / Chronic Care Management: CRR required this visit?  No   CCM required this visit?  No      Plan:     I have personally reviewed and noted the following in the patient's chart:   Medical and social history Use of alcohol, tobacco or illicit drugs  Current medications and supplements including opioid prescriptions. Patient is not currently taking opioid prescriptions. Functional ability and status Nutritional status Physical activity Advanced directives List of other  physicians Hospitalizations, surgeries, and ER visits in previous 12 months Vitals Screenings to include cognitive, depression, and falls Referrals and appointments  In addition, I have reviewed and discussed with patient certain preventive protocols, quality metrics, and best practice recommendations. A written personalized care plan for preventive services as well as general preventive health recommendations were provided to patient.     Beatris Ship, [place]   10/21/2022   Nurse Notes: None

## 2022-10-21 NOTE — Patient Instructions (Signed)
Alex Church , Thank you for taking time to come for your Medicare Wellness Visit. I appreciate your ongoing commitment to your health goals. Please review the following plan we discussed and let me know if I can assist you in the future.   These are the goals we discussed:  Goals      Maintain healthy active lifestyle.     Patient Stated     Start doing some strength training        This is a list of the screening recommended for you and due dates:  Health Maintenance  Topic Date Due   Zoster (Shingles) Vaccine (1 of 2) Never done   Complete foot exam   06/20/2021   COVID-19 Vaccine (5 - 2023-24 season) 05/09/2022   Hemoglobin A1C  02/25/2023   Eye exam for diabetics  03/28/2023   Yearly kidney function blood test for diabetes  08/27/2023   Yearly kidney health urinalysis for diabetes  08/27/2023   Medicare Annual Wellness Visit  10/22/2023   DTaP/Tdap/Td vaccine (3 - Td or Tdap) 12/12/2029   Pneumonia Vaccine  Completed   Flu Shot  Completed   Hepatitis C Screening: USPSTF Recommendation to screen - Ages 68-79 yo.  Completed   HPV Vaccine  Aged Out     Next appointment: Follow up in one year for your annual wellness visit.   Preventive Care 6 Years and Older, Male Preventive care refers to lifestyle choices and visits with your health care provider that can promote health and wellness. What does preventive care include? A yearly physical exam. This is also called an annual well check. Dental exams once or twice a year. Routine eye exams. Ask your health care provider how often you should have your eyes checked. Personal lifestyle choices, including: Daily care of your teeth and gums. Regular physical activity. Eating a healthy diet. Avoiding tobacco and drug use. Limiting alcohol use. Practicing safe sex. Taking low doses of aspirin every day. Taking vitamin and mineral supplements as recommended by your health care provider. What happens during an annual well  check? The services and screenings done by your health care provider during your annual well check will depend on your age, overall health, lifestyle risk factors, and family history of disease. Counseling  Your health care provider may ask you questions about your: Alcohol use. Tobacco use. Drug use. Emotional well-being. Home and relationship well-being. Sexual activity. Eating habits. History of falls. Memory and ability to understand (cognition). Work and work Statistician. Screening  You may have the following tests or measurements: Height, weight, and BMI. Blood pressure. Lipid and cholesterol levels. These may be checked every 5 years, or more frequently if you are over 57 years old. Skin check. Lung cancer screening. You may have this screening every year starting at age 23 if you have a 30-pack-year history of smoking and currently smoke or have quit within the past 15 years. Fecal occult blood test (FOBT) of the stool. You may have this test every year starting at age 51. Flexible sigmoidoscopy or colonoscopy. You may have a sigmoidoscopy every 5 years or a colonoscopy every 10 years starting at age 31. Prostate cancer screening. Recommendations will vary depending on your family history and other risks. Hepatitis C blood test. Hepatitis B blood test. Sexually transmitted disease (STD) testing. Diabetes screening. This is done by checking your blood sugar (glucose) after you have not eaten for a while (fasting). You may have this done every 1-3 years. Abdominal aortic aneurysm (  AAA) screening. You may need this if you are a current or former smoker. Osteoporosis. You may be screened starting at age 91 if you are at high risk. Talk with your health care provider about your test results, treatment options, and if necessary, the need for more tests. Vaccines  Your health care provider may recommend certain vaccines, such as: Influenza vaccine. This is recommended every  year. Tetanus, diphtheria, and acellular pertussis (Tdap, Td) vaccine. You may need a Td booster every 10 years. Zoster vaccine. You may need this after age 13. Pneumococcal 13-valent conjugate (PCV13) vaccine. One dose is recommended after age 15. Pneumococcal polysaccharide (PPSV23) vaccine. One dose is recommended after age 34. Talk to your health care provider about which screenings and vaccines you need and how often you need them. This information is not intended to replace advice given to you by your health care provider. Make sure you discuss any questions you have with your health care provider. Document Released: 09/21/2015 Document Revised: 05/14/2016 Document Reviewed: 06/26/2015 Elsevier Interactive Patient Education  2017 [place] Prevention in the Home Falls can cause injuries. They can happen to people of all ages. There are many things you can do to make your home safe and to help prevent falls. What can I do on the outside of my home? Regularly fix the edges of walkways and driveways and fix any cracks. Remove anything that might make you trip as you walk through a door, such as a raised step or threshold. Trim any bushes or trees on the path to your home. Use bright outdoor lighting. Clear any walking paths of anything that might make someone trip, such as rocks or tools. Regularly check to see if handrails are loose or broken. Make sure that both sides of any steps have handrails. Any raised decks and porches should have guardrails on the edges. Have any leaves, snow, or ice cleared regularly. Use sand or salt on walking paths during winter. Clean up any spills in your garage right away. This includes oil or grease spills. What can I do in the bathroom? Use night lights. Install grab bars by the toilet and in the tub and shower. Do not use towel bars as grab bars. Use non-skid mats or decals in the tub or shower. If you need to sit down in the shower, use a  plastic, non-slip stool. Keep the floor dry. Clean up any water that spills on the floor as soon as it happens. Remove soap buildup in the tub or shower regularly. Attach bath mats securely with double-sided non-slip rug tape. Do not have throw rugs and other things on the floor that can make you trip. What can I do in the bedroom? Use night lights. Make sure that you have a light by your bed that is easy to reach. Do not use any sheets or blankets that are too big for your bed. They should not hang down onto the floor. Have a firm chair that has side arms. You can use this for support while you get dressed. Do not have throw rugs and other things on the floor that can make you trip. What can I do in the kitchen? Clean up any spills right away. Avoid walking on wet floors. Keep items that you use a lot in easy-to-reach places. If you need to reach something above you, use a strong step stool that has a grab bar. Keep electrical cords out of the way. Do not use floor polish  or wax that makes floors slippery. If you must use wax, use non-skid floor wax. Do not have throw rugs and other things on the floor that can make you trip. What can I do with my stairs? Do not leave any items on the stairs. Make sure that there are handrails on both sides of the stairs and use them. Fix handrails that are broken or loose. Make sure that handrails are as long as the stairways. Check any carpeting to make sure that it is firmly attached to the stairs. Fix any carpet that is loose or worn. Avoid having throw rugs at the top or bottom of the stairs. If you do have throw rugs, attach them to the floor with carpet tape. Make sure that you have a light switch at the top of the stairs and the bottom of the stairs. If you do not have them, ask someone to add them for you. What else can I do to help prevent falls? Wear shoes that: Do not have high heels. Have rubber bottoms. Are comfortable and fit you  well. Are closed at the toe. Do not wear sandals. If you use a stepladder: Make sure that it is fully opened. Do not climb a closed stepladder. Make sure that both sides of the stepladder are locked into place. Ask someone to hold it for you, if possible. Clearly mark and make sure that you can see: Any grab bars or handrails. First and last steps. Where the edge of each step is. Use tools that help you move around (mobility aids) if they are needed. These include: Canes. Walkers. Scooters. Crutches. Turn on the lights when you go into a dark area. Replace any light bulbs as soon as they burn out. Set up your furniture so you have a clear path. Avoid moving your furniture around. If any of your floors are uneven, fix them. If there are any pets around you, be aware of where they are. Review your medicines with your doctor. Some medicines can make you feel dizzy. This can increase your chance of falling. Ask your doctor what other things that you can do to help prevent falls. This information is not intended to replace advice given to you by your health care provider. Make sure you discuss any questions you have with your health care provider. Document Released: 06/21/2009 Document Revised: 01/31/2016 Document Reviewed: 09/29/2014 Elsevier Interactive Patient Education  2017 [employer].

## 2022-10-23 NOTE — Progress Notes (Signed)
I have reviewed and agree with [facility] documentation.  Kathlene November, MD

## 2022-11-04 ENCOUNTER — Other Ambulatory Visit: Payer: Self-pay | Admitting: Internal Medicine

## 2022-11-04 ENCOUNTER — Other Ambulatory Visit: Payer: Self-pay | Admitting: Family

## 2022-11-05 ENCOUNTER — Other Ambulatory Visit (HOSPITAL_COMMUNITY): Payer: Self-pay

## 2022-11-05 MED ORDER — JANUVIA 100 MG PO TABS
100.0000 mg | ORAL_TABLET | Freq: Every day | ORAL | 1 refills | Status: DC
  Filled 2022-11-05: qty 90, 90d supply, fill #0
  Filled 2023-01-28: qty 90, 90d supply, fill #1

## 2022-11-06 ENCOUNTER — Other Ambulatory Visit (HOSPITAL_COMMUNITY): Payer: Self-pay

## 2022-11-06 MED ORDER — LISINOPRIL 20 MG PO TABS
20.0000 mg | ORAL_TABLET | Freq: Two times a day (BID) | ORAL | 1 refills | Status: DC
  Filled 2022-11-06 (×2): qty 180, 90d supply, fill #0
  Filled 2023-01-28: qty 180, 90d supply, fill #1

## 2022-11-25 ENCOUNTER — Other Ambulatory Visit: Payer: Self-pay | Admitting: Internal Medicine

## 2022-11-25 ENCOUNTER — Other Ambulatory Visit (HOSPITAL_COMMUNITY): Payer: Self-pay

## 2022-11-25 MED ORDER — ALLOPURINOL 100 MG PO TABS
100.0000 mg | ORAL_TABLET | Freq: Two times a day (BID) | ORAL | 1 refills | Status: DC
  Filled 2022-11-25: qty 180, 90d supply, fill #0
  Filled 2023-02-09: qty 180, 90d supply, fill #1

## 2022-12-12 ENCOUNTER — Encounter: Payer: Medicare Other | Admitting: Internal Medicine

## 2022-12-17 ENCOUNTER — Other Ambulatory Visit (HOSPITAL_COMMUNITY): Payer: Self-pay

## 2023-01-19 ENCOUNTER — Ambulatory Visit (INDEPENDENT_AMBULATORY_CARE_PROVIDER_SITE_OTHER): Payer: Medicare Other | Admitting: Internal Medicine

## 2023-01-19 ENCOUNTER — Encounter: Payer: Self-pay | Admitting: Internal Medicine

## 2023-01-19 VITALS — BP 130/74 | HR 70 | Temp 98.0°F | Resp 16 | Ht 72.0 in | Wt 196.2 lb

## 2023-01-19 DIAGNOSIS — E782 Mixed hyperlipidemia: Secondary | ICD-10-CM | POA: Diagnosis not present

## 2023-01-19 DIAGNOSIS — Z0000 Encounter for general adult medical examination without abnormal findings: Secondary | ICD-10-CM | POA: Diagnosis not present

## 2023-01-19 DIAGNOSIS — E1169 Type 2 diabetes mellitus with other specified complication: Secondary | ICD-10-CM

## 2023-01-19 DIAGNOSIS — I10 Essential (primary) hypertension: Secondary | ICD-10-CM

## 2023-01-19 NOTE — Assessment & Plan Note (Signed)
Here for CPX, see separate documentation DM HTN hyperlipidemia, tophaceous gout: Good med compliance, continue same medications, checking labs RTC 4 to 5 months

## 2023-01-19 NOTE — Assessment & Plan Note (Signed)
-  Tdap : 12-2019  -pnm 23: 2019; - pnm 13: 12/2016 - zostavax  2014 - last  Covid vax ~ 08-2022, booster indicated  after 4 months  - Vaccines I recommend: PNM 20, RSV, Shingrix, COVID booster and a flu shot every fall --CCS: Never had a scope, declines screenings -Prostate cancer screening: we agreed no further screenings . - Patient education: Recommend to continue with his healthy lifestyle --Healthcare POA: See AVS

## 2023-01-19 NOTE — Progress Notes (Addendum)
Blood sugar cholesterol blood pressure today urine.  Colon cancer right small the guidelines are  Subjective:    Patient ID: Alex Church, male    DOB: [birth year], 77 y.o.   MRN: [id number]  DOS:  01/19/2023 Type of visit - description: cpx   Here for CPX. Reports she is feeling great and has no concerns   Review of Systems  Other than above, a 14 point review of systems is negative     Past Medical History:  Diagnosis Date   B12 deficiency    monthly shots   Diabetes mellitus    Glaucoma suspect    Hyperlipemia    Hypertension     Past Surgical History:  Procedure Laterality Date   NO PAST SURGERIES     Social History   Socioeconomic History   Marital status: Married    Spouse name: Not on file   Number of children: 0   Years of education: Not on file   Highest education level: Not on file  Occupational History   Occupation: retired from [employer], works for a lab driving  Tobacco Use   Smoking status: Never   Smokeless tobacco: Never  Substance and Sexual Activity   Alcohol use: No   Drug use: No   Sexual activity: Not on file  Other Topics Concern   Not on file  Social History Narrative   Married (2nd marriage)   Lives w/ wife     Has a rescue dog   Social Determinants of Health   Financial Resource Strain: Low Risk  (10/21/2022)   Overall Financial Resource Strain (CARDIA)    Difficulty of Paying Living Expenses: Not hard at all  Food Insecurity: No Food Insecurity (10/21/2022)   Hunger Vital Sign    Worried About Running Out of Food in the Last Year: Never true    Ran Out of Food in the Last Year: Never true  Transportation Needs: No Transportation Needs (10/21/2022)   PRAPARE - Administrator, Civil Service (Medical): No    Lack of Transportation (Non-Medical): No  Physical Activity: Sufficiently Active (10/10/2021)   Exercise Vital Sign    Days of Exercise per Week: 7 days    Minutes of Exercise per Session: 30 min  Stress: No Stress  Concern Present (10/21/2022)   [employer] of Occupational Health - Occupational Stress Questionnaire    Feeling of Stress : Not at all  Social Connections: Moderately Isolated (10/10/2021)   Social Connection and Isolation Panel [NHANES]    Frequency of Communication with Friends and Family: More than three times a week    Frequency of Social Gatherings with Friends and Family: More than three times a week    Attends Religious Services: Never    Database administrator or Organizations: No    Attends Banker Meetings: Never    Marital Status: Married  Catering manager Violence: Not At Risk (10/21/2022)   Humiliation, Afraid, Rape, and Kick questionnaire    Fear of Current or Ex-Partner: No    Emotionally Abused: No    Physically Abused: No    Sexually Abused: No    Current Outpatient Medications  Medication Instructions   allopurinol (ZYLOPRIM) 100 mg, Oral, 2 times daily   atorvastatin (LIPITOR) 10 mg, Oral, Daily at bedtime   Blood Glucose Monitoring Suppl (ONETOUCH VERIO FLEX SYSTEM) w/Device KIT Check blood sugar once daily   carvedilol (COREG) 12.5 mg, Oral, 2 times daily with meals   cloNIDine (  CATAPRES) 0.2 mg, Oral, 2 times daily   cyanocobalamin (VITAMIN B12) 1,000 mcg, Oral, Every other day   glucose blood test strip Check blood sugars once daily   Januvia 100 mg, Oral, Daily   lisinopril (ZESTRIL) 20 mg, Oral, 2 times daily   Magnesium Oxide 800 mg, Oral, 2 times daily   metFORMIN (GLUCOPHAGE) 1,000 mg, Oral, 2 times daily with meals   ONE TOUCH LANCETS MISC Check blood sugar once daily   pioglitazone (ACTOS) 30 mg, Oral, Daily       Objective:   Physical Exam BP 130/74   Pulse 70   Temp 98 F (36.7 C) (Oral)   Resp 16   Ht 6' (1.829 m)   Wt 196 lb 4 oz (89 kg)   SpO2 96%   BMI 26.62 kg/m  General:   Well developed, NAD, BMI noted. HEENT:  Normocephalic . Face symmetric, atraumatic Lungs:  CTA B Normal respiratory effort, no intercostal  retractions, no accessory muscle use. Heart: RRR,  no murmur.  DM foot exam: No edema, pinprick examination normal.  Good capillary refills. Skin: Not pale. Not jaundice Neurologic:  alert & oriented X3.  Speech normal, gait appropriate for age and unassisted Psych--  Cognition and judgment appear intact.  Cooperative with normal attention span and concentration.  Behavior appropriate. No anxious or depressed appearing.      Assessment     Assessment (transfer from Dr Alwyn Ren 02-2016)  DM HTN Hyperlipidemia Frequent PVCs versus trigeminy (resolved after hypomagnesemia treatment) Hypomagnesemia: Not on PPIs on diuretics. 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 +FH CAD  PLAN: Here for CPX, see separate documentation DM HTN hyperlipidemia, tophaceous gout: Good med compliance, continue same medications, checking labs RTC 4 to 5 months  -Tdap : 12-2019  -pnm 23: 2019; - pnm 13: 12/2016 - zostavax  2014 - last  Covid vax ~ 08-2022, booster indicated  after 4 months  - Vaccines I recommend: PNM 20, RSV, Shingrix, COVID booster and a flu shot every fall --CCS: Never had a scope, declines screenings -Prostate cancer screening: we agreed no further screenings . - Patient education: Recommend to continue with his healthy lifestyle --Healthcare POA: See AVS

## 2023-01-19 NOTE — Patient Instructions (Addendum)
Vaccines I recommend: Shingrix (shingles) Covid booster RSV vaccine Pneumonia shot (PNM 20) Flu shot every fall  Check the  blood pressure regularly BP GOAL is between 110/65 and  135/85. If it is consistently higher or lower, let me know      GO TO THE LAB : Get the blood work     GO TO THE FRONT DESK, PLEASE SCHEDULE YOUR APPOINTMENTS Come back for   checkup in 4 to 5 months    "Health Care Power of attorney" ,  "Living will" (Advance care planning documents)  If you already have a living will or healthcare power of attorney, is recommended you bring the copy to be scanned in your chart.   The document will be available to all the doctors you see in the system.  Advance care planning is a process that supports adults in  understanding and sharing their preferences regarding future medical care.  The patient's preferences are recorded in documents called Advance Directives and the can be modified at any time while the patient is in full mental capacity.   If you don't have one, please consider create one.      More information at: [email or web address]

## 2023-01-20 LAB — CBC WITH DIFFERENTIAL/PLATELET
Basophils Absolute: 0 10*3/uL (ref 0.0–0.1)
Basophils Relative: 0.2 % (ref 0.0–3.0)
Eosinophils Absolute: 0.1 10*3/uL (ref 0.0–0.7)
Eosinophils Relative: 1.2 % (ref 0.0–5.0)
HCT: 39.2 % (ref 39.0–52.0)
Hemoglobin: 13 g/dL (ref 13.0–17.0)
Lymphocytes Relative: 20.3 % (ref 12.0–46.0)
Lymphs Abs: 1.9 10*3/uL (ref 0.7–4.0)
MCHC: 33.1 g/dL (ref 30.0–36.0)
MCV: 91.9 fl (ref 78.0–100.0)
Monocytes Absolute: 0.6 10*3/uL (ref 0.1–1.0)
Monocytes Relative: 6.3 % (ref 3.0–12.0)
Neutro Abs: 6.8 10*3/uL (ref 1.4–7.7)
Neutrophils Relative %: 72 % (ref 43.0–77.0)
Platelets: 208 10*3/uL (ref 150.0–400.0)
RBC: 4.26 Mil/uL (ref 4.22–5.81)
RDW: 15.7 % — ABNORMAL HIGH (ref 11.5–15.5)
WBC: 9.4 10*3/uL (ref 4.0–10.5)

## 2023-01-20 LAB — BASIC METABOLIC PANEL
BUN: 23 mg/dL (ref 6–23)
CO2: 27 mEq/L (ref 19–32)
Calcium: 9.3 mg/dL (ref 8.4–10.5)
Chloride: 101 mEq/L (ref 96–112)
Creatinine, Ser: 1.29 mg/dL (ref 0.40–1.50)
GFR: 53.79 mL/min — ABNORMAL LOW (ref >60.00)
Glucose, Bld: 113 mg/dL — ABNORMAL HIGH (ref 70–99)
Potassium: 4.4 mEq/L (ref 3.5–5.1)
Sodium: 138 mEq/L (ref 135–145)

## 2023-01-20 LAB — HEMOGLOBIN A1C: Hgb A1c MFr Bld: 6.9 % — ABNORMAL HIGH (ref 4.6–6.5)

## 2023-01-27 ENCOUNTER — Encounter: Payer: Self-pay | Admitting: Internal Medicine

## 2023-01-28 ENCOUNTER — Other Ambulatory Visit: Payer: Self-pay | Admitting: Internal Medicine

## 2023-01-29 ENCOUNTER — Other Ambulatory Visit (HOSPITAL_COMMUNITY): Payer: Self-pay

## 2023-01-29 ENCOUNTER — Encounter: Payer: Self-pay | Admitting: Internal Medicine

## 2023-01-29 MED ORDER — PIOGLITAZONE HCL 30 MG PO TABS
30.0000 mg | ORAL_TABLET | Freq: Every day | ORAL | 1 refills | Status: DC
  Filled 2023-01-29: qty 90, 90d supply, fill #0
  Filled 2023-05-03: qty 90, 90d supply, fill #1

## 2023-03-01 ENCOUNTER — Other Ambulatory Visit: Payer: Self-pay | Admitting: Internal Medicine

## 2023-03-02 ENCOUNTER — Other Ambulatory Visit (HOSPITAL_COMMUNITY): Payer: Self-pay

## 2023-03-02 MED ORDER — METFORMIN HCL 1000 MG PO TABS
1000.0000 mg | ORAL_TABLET | Freq: Two times a day (BID) | ORAL | 0 refills | Status: DC
  Filled 2023-03-02: qty 180, 90d supply, fill #0

## 2023-03-22 ENCOUNTER — Other Ambulatory Visit: Payer: Self-pay | Admitting: Internal Medicine

## 2023-03-23 ENCOUNTER — Other Ambulatory Visit (HOSPITAL_COMMUNITY): Payer: Self-pay

## 2023-03-23 MED ORDER — CARVEDILOL 12.5 MG PO TABS
12.5000 mg | ORAL_TABLET | Freq: Two times a day (BID) | ORAL | 0 refills | Status: DC
  Filled 2023-03-23: qty 180, 90d supply, fill #0

## 2023-03-23 MED ORDER — ATORVASTATIN CALCIUM 10 MG PO TABS
10.0000 mg | ORAL_TABLET | Freq: Every day | ORAL | 0 refills | Status: DC
  Filled 2023-03-23: qty 90, 90d supply, fill #0

## 2023-04-29 ENCOUNTER — Encounter: Payer: Self-pay | Admitting: Internal Medicine

## 2023-05-03 ENCOUNTER — Other Ambulatory Visit: Payer: Self-pay | Admitting: Internal Medicine

## 2023-05-04 ENCOUNTER — Telehealth: Payer: Self-pay

## 2023-05-04 ENCOUNTER — Ambulatory Visit: Payer: Medicare Other | Admitting: Internal Medicine

## 2023-05-04 ENCOUNTER — Other Ambulatory Visit (HOSPITAL_COMMUNITY): Payer: Self-pay

## 2023-05-04 MED ORDER — JANUVIA 100 MG PO TABS
100.0000 mg | ORAL_TABLET | Freq: Every day | ORAL | 0 refills | Status: DC
  Filled 2023-05-04: qty 30, 30d supply, fill #0

## 2023-05-04 MED ORDER — LISINOPRIL 20 MG PO TABS
20.0000 mg | ORAL_TABLET | Freq: Two times a day (BID) | ORAL | 0 refills | Status: DC
  Filled 2023-05-04: qty 180, 90d supply, fill #0

## 2023-05-04 NOTE — Telephone Encounter (Signed)
Addendum: Sent a message to the patient's wife  -who is also my patient-   regards the patient's symptoms.

## 2023-05-04 NOTE — Telephone Encounter (Signed)
Appropriate advice was provided.  Noted.

## 2023-05-04 NOTE — Telephone Encounter (Signed)
Woodroe Mode [place]  P [facility] Clinical Pool (supporting Wanda Plump, MD)53 minutes ago (7:15 AM)    Hi, I just found out that my sister, Greggory Keen has an appointment with Dr. Drue Novel at 3:25 today she is willing to give to [facility].  I would like this appointment for [facility] unless Dr. Drue Novel can see him earlier today.  She will reschedule. Thanks!!

## 2023-05-04 NOTE — Telephone Encounter (Signed)
Received mychart messages below from Pt's wife over weekend.   Called and spoke w/ [name]- informed her of my concern of possible stroke (L sided weakness, gait issues, memory concerns), informed her that pcp is not here until later today, recommend that she not wait and go ahead and take Pt to ER for workup for MRI/CT. [name] verbalized understanding.

## 2023-05-04 NOTE — Telephone Encounter (Signed)
Pt's wife Alex Church called back again- Pt refuses to come in today, he states he will keep appt for next week on 05/13/23.

## 2023-05-04 NOTE — Telephone Encounter (Signed)
I spoke with the patient.  He was alert oriented x 3, speech was fluent. Advised I am concerned about his left-sided weakness, states it possibly started a week ago. Recommend ER, he politely declined but eventually agreed to see me tomorrow at the office, at around 11 AM. Please add him to my schedule.

## 2023-05-04 NOTE — Telephone Encounter (Signed)
Pt's wife called back- Pt refused to go to the ER, I offered an appt this morning with another provider, Pt refused that as well, he only wants to see PCP. His sister in law has an appt today at 3:40pm w/ Dr. Drue Novel- she has given [place] permission to cancel her appt and put Pt there. I have scheduled his appt at 3:40 w/ PCP, made Pt and wife aware that PCP may send him to ED anyway, and to go on to ED if sx's become worse.

## 2023-05-04 NOTE — Telephone Encounter (Signed)
Woodroe Mode [place]als  P [facility] Clinical Pool (supporting Wanda Plump, MD)1 hour ago (6:31 AM)    I am contacting you on behalf of my husband Alex Church dob 09/28/1945.  We have an appointment with you on September 4 th., but he has gotten worse this past weekend.  Memory loss has worsened , getting lost when driving , unsteady gait, left side weakness, fell yesterday. I tried to get him to an ED but he wouldn't go.  Can you please work him in anytime today?  I would appreciate it very much.

## 2023-05-04 NOTE — Telephone Encounter (Signed)
Alex Church  P [facility] Clinical Pool (supporting Wanda Plump, MD)1 hour ago (6:45 AM)    He is also through up [place] , and has a bad cataract in one eye.  Thanks, [name]

## 2023-05-05 ENCOUNTER — Other Ambulatory Visit: Payer: Self-pay

## 2023-05-05 ENCOUNTER — Emergency Department (HOSPITAL_BASED_OUTPATIENT_CLINIC_OR_DEPARTMENT_OTHER): Payer: Medicare Other

## 2023-05-05 ENCOUNTER — Inpatient Hospital Stay (HOSPITAL_COMMUNITY): Payer: Medicare Other

## 2023-05-05 ENCOUNTER — Inpatient Hospital Stay (HOSPITAL_BASED_OUTPATIENT_CLINIC_OR_DEPARTMENT_OTHER)
Admission: EM | Admit: 2023-05-05 | Discharge: 2023-05-15 | DRG: 025 | Disposition: A | Discharge status: Rehabilitation Facility | Payer: Medicare Other | Attending: Internal Medicine | Admitting: Internal Medicine

## 2023-05-05 ENCOUNTER — Encounter (HOSPITAL_BASED_OUTPATIENT_CLINIC_OR_DEPARTMENT_OTHER): Payer: Self-pay | Admitting: Emergency Medicine

## 2023-05-05 ENCOUNTER — Encounter: Payer: Self-pay | Admitting: Internal Medicine

## 2023-05-05 ENCOUNTER — Ambulatory Visit (INDEPENDENT_AMBULATORY_CARE_PROVIDER_SITE_OTHER): Payer: Medicare Other | Admitting: Internal Medicine

## 2023-05-05 VITALS — BP 116/64 | HR 58 | Temp 97.5°F | Resp 16 | Ht 72.0 in | Wt 194.5 lb

## 2023-05-05 DIAGNOSIS — G935 Compression of brain: Secondary | ICD-10-CM | POA: Diagnosis present

## 2023-05-05 DIAGNOSIS — N200 Calculus of kidney: Secondary | ICD-10-CM | POA: Diagnosis not present

## 2023-05-05 DIAGNOSIS — N390 Urinary tract infection, site not specified: Secondary | ICD-10-CM | POA: Diagnosis not present

## 2023-05-05 DIAGNOSIS — K5730 Diverticulosis of large intestine without perforation or abscess without bleeding: Secondary | ICD-10-CM | POA: Diagnosis not present

## 2023-05-05 DIAGNOSIS — G9340 Encephalopathy, unspecified: Secondary | ICD-10-CM | POA: Diagnosis present

## 2023-05-05 DIAGNOSIS — Z1152 Encounter for screening for COVID-19: Secondary | ICD-10-CM | POA: Diagnosis not present

## 2023-05-05 DIAGNOSIS — D496 Neoplasm of unspecified behavior of brain: Secondary | ICD-10-CM | POA: Diagnosis not present

## 2023-05-05 DIAGNOSIS — G939 Disorder of brain, unspecified: Secondary | ICD-10-CM | POA: Diagnosis not present

## 2023-05-05 DIAGNOSIS — E785 Hyperlipidemia, unspecified: Secondary | ICD-10-CM | POA: Diagnosis present

## 2023-05-05 DIAGNOSIS — A499 Bacterial infection, unspecified: Secondary | ICD-10-CM | POA: Diagnosis not present

## 2023-05-05 DIAGNOSIS — Z7984 Long term (current) use of oral hypoglycemic drugs: Secondary | ICD-10-CM

## 2023-05-05 DIAGNOSIS — Z8249 Family history of ischemic heart disease and other diseases of the circulatory system: Secondary | ICD-10-CM | POA: Diagnosis not present

## 2023-05-05 DIAGNOSIS — I6503 Occlusion and stenosis of bilateral vertebral arteries: Secondary | ICD-10-CM | POA: Diagnosis not present

## 2023-05-05 DIAGNOSIS — Z9889 Other specified postprocedural states: Secondary | ICD-10-CM | POA: Diagnosis not present

## 2023-05-05 DIAGNOSIS — T380X5A Adverse effect of glucocorticoids and synthetic analogues, initial encounter: Secondary | ICD-10-CM | POA: Diagnosis not present

## 2023-05-05 DIAGNOSIS — B964 Proteus (mirabilis) (morganii) as the cause of diseases classified elsewhere: Secondary | ICD-10-CM | POA: Diagnosis not present

## 2023-05-05 DIAGNOSIS — G9389 Other specified disorders of brain: Principal | ICD-10-CM

## 2023-05-05 DIAGNOSIS — E782 Mixed hyperlipidemia: Secondary | ICD-10-CM | POA: Diagnosis not present

## 2023-05-05 DIAGNOSIS — E119 Type 2 diabetes mellitus without complications: Secondary | ICD-10-CM | POA: Diagnosis not present

## 2023-05-05 DIAGNOSIS — E538 Deficiency of other specified B group vitamins: Secondary | ICD-10-CM | POA: Diagnosis not present

## 2023-05-05 DIAGNOSIS — G9341 Metabolic encephalopathy: Secondary | ICD-10-CM | POA: Diagnosis not present

## 2023-05-05 DIAGNOSIS — Z794 Long term (current) use of insulin: Secondary | ICD-10-CM | POA: Diagnosis not present

## 2023-05-05 DIAGNOSIS — R262 Difficulty in walking, not elsewhere classified: Secondary | ICD-10-CM | POA: Diagnosis not present

## 2023-05-05 DIAGNOSIS — D649 Anemia, unspecified: Secondary | ICD-10-CM | POA: Diagnosis not present

## 2023-05-05 DIAGNOSIS — Z833 Family history of diabetes mellitus: Secondary | ICD-10-CM

## 2023-05-05 DIAGNOSIS — R079 Chest pain, unspecified: Secondary | ICD-10-CM | POA: Diagnosis not present

## 2023-05-05 DIAGNOSIS — G936 Cerebral edema: Secondary | ICD-10-CM | POA: Diagnosis not present

## 2023-05-05 DIAGNOSIS — G8194 Hemiplegia, unspecified affecting left nondominant side: Secondary | ICD-10-CM | POA: Diagnosis not present

## 2023-05-05 DIAGNOSIS — I10 Essential (primary) hypertension: Secondary | ICD-10-CM | POA: Diagnosis not present

## 2023-05-05 DIAGNOSIS — G479 Sleep disorder, unspecified: Secondary | ICD-10-CM | POA: Diagnosis not present

## 2023-05-05 DIAGNOSIS — R410 Disorientation, unspecified: Secondary | ICD-10-CM | POA: Diagnosis not present

## 2023-05-05 DIAGNOSIS — E1165 Type 2 diabetes mellitus with hyperglycemia: Secondary | ICD-10-CM | POA: Diagnosis not present

## 2023-05-05 DIAGNOSIS — K8020 Calculus of gallbladder without cholecystitis without obstruction: Secondary | ICD-10-CM | POA: Diagnosis not present

## 2023-05-05 DIAGNOSIS — Z79899 Other long term (current) drug therapy: Secondary | ICD-10-CM

## 2023-05-05 DIAGNOSIS — R531 Weakness: Secondary | ICD-10-CM

## 2023-05-05 DIAGNOSIS — D499 Neoplasm of unspecified behavior of unspecified site: Secondary | ICD-10-CM | POA: Diagnosis not present

## 2023-05-05 DIAGNOSIS — R300 Dysuria: Secondary | ICD-10-CM | POA: Diagnosis not present

## 2023-05-05 DIAGNOSIS — E872 Acidosis, unspecified: Secondary | ICD-10-CM | POA: Diagnosis present

## 2023-05-05 DIAGNOSIS — D430 Neoplasm of uncertain behavior of brain, supratentorial: Secondary | ICD-10-CM | POA: Diagnosis not present

## 2023-05-05 DIAGNOSIS — I700 Atherosclerosis of aorta: Secondary | ICD-10-CM | POA: Diagnosis not present

## 2023-05-05 DIAGNOSIS — D72829 Elevated white blood cell count, unspecified: Secondary | ICD-10-CM | POA: Diagnosis not present

## 2023-05-05 DIAGNOSIS — E871 Hypo-osmolality and hyponatremia: Secondary | ICD-10-CM | POA: Diagnosis not present

## 2023-05-05 DIAGNOSIS — N3001 Acute cystitis with hematuria: Secondary | ICD-10-CM | POA: Diagnosis not present

## 2023-05-05 DIAGNOSIS — R918 Other nonspecific abnormal finding of lung field: Secondary | ICD-10-CM | POA: Diagnosis not present

## 2023-05-05 DIAGNOSIS — C719 Malignant neoplasm of brain, unspecified: Secondary | ICD-10-CM | POA: Diagnosis not present

## 2023-05-05 DIAGNOSIS — C711 Malignant neoplasm of frontal lobe: Principal | ICD-10-CM | POA: Diagnosis present

## 2023-05-05 LAB — URINALYSIS, W/ REFLEX TO CULTURE (INFECTION SUSPECTED)
Bilirubin Urine: NEGATIVE
Glucose, UA: NEGATIVE mg/dL
Hgb urine dipstick: NEGATIVE
Ketones, ur: NEGATIVE mg/dL
Leukocytes,Ua: NEGATIVE
Nitrite: NEGATIVE
Protein, ur: NEGATIVE mg/dL
RBC / HPF: NONE SEEN RBC/hpf (ref 0–5)
Specific Gravity, Urine: 1.02 (ref 1.005–1.030)
WBC, UA: NONE SEEN WBC/hpf (ref 0–5)
pH: 5 (ref 5.0–8.0)

## 2023-05-05 LAB — CBC WITH DIFFERENTIAL/PLATELET
Abs Immature Granulocytes: 0.04 10*3/uL (ref 0.00–0.07)
Basophils Absolute: 0 10*3/uL (ref 0.0–0.1)
Basophils Relative: 0 %
Eosinophils Absolute: 0 10*3/uL (ref 0.0–0.5)
Eosinophils Relative: 0 %
HCT: 38.6 % — ABNORMAL LOW (ref 39.0–52.0)
Hemoglobin: 12.5 g/dL — ABNORMAL LOW (ref 13.0–17.0)
Immature Granulocytes: 0 %
Lymphocytes Relative: 18 %
Lymphs Abs: 1.6 10*3/uL (ref 0.7–4.0)
MCH: 30.1 pg (ref 26.0–34.0)
MCHC: 32.4 g/dL (ref 30.0–36.0)
MCV: 93 fL (ref 80.0–100.0)
Monocytes Absolute: 0.8 10*3/uL (ref 0.1–1.0)
Monocytes Relative: 9 %
Neutro Abs: 6.6 10*3/uL (ref 1.7–7.7)
Neutrophils Relative %: 73 %
Platelets: 202 10*3/uL (ref 150–400)
RBC: 4.15 MIL/uL — ABNORMAL LOW (ref 4.22–5.81)
RDW: 14.9 % (ref 11.5–15.5)
WBC: 9.1 10*3/uL (ref 4.0–10.5)
nRBC: 0 % (ref 0.0–0.2)

## 2023-05-05 LAB — COMPREHENSIVE METABOLIC PANEL
ALT: 15 U/L (ref 0–44)
AST: 18 U/L (ref 15–41)
Albumin: 3.8 g/dL (ref 3.5–5.0)
Alkaline Phosphatase: 62 U/L (ref 38–126)
Anion gap: 10 (ref 5–15)
BUN: 18 mg/dL (ref 8–23)
CO2: 25 mmol/L (ref 22–32)
Calcium: 9.1 mg/dL (ref 8.9–10.3)
Chloride: 101 mmol/L (ref 98–111)
Creatinine, Ser: 1.2 mg/dL (ref 0.61–1.24)
GFR, Estimated: >60 mL/min (ref >60)
Glucose, Bld: 148 mg/dL — ABNORMAL HIGH (ref 70–99)
Potassium: 4.3 mmol/L (ref 3.5–5.1)
Sodium: 136 mmol/L (ref 135–145)
Total Bilirubin: 0.5 mg/dL (ref 0.3–1.2)
Total Protein: 7.2 g/dL (ref 6.5–8.1)

## 2023-05-05 LAB — SARS CORONAVIRUS 2 BY RT PCR: SARS Coronavirus 2 by RT PCR: NEGATIVE

## 2023-05-05 LAB — CBG MONITORING, ED
Glucose-Capillary: 144 mg/dL — ABNORMAL HIGH (ref 70–99)
Glucose-Capillary: 148 mg/dL — ABNORMAL HIGH (ref 70–99)

## 2023-05-05 LAB — GLUCOSE, CAPILLARY: Glucose-Capillary: 193 mg/dL — ABNORMAL HIGH (ref 70–99)

## 2023-05-05 MED ORDER — GADOBUTROL 1 MMOL/ML IV SOLN
9.0000 mL | Freq: Once | INTRAVENOUS | Status: AC | PRN
  Administered 2023-05-05: 9 mL via INTRAVENOUS

## 2023-05-05 MED ORDER — MELATONIN 3 MG PO TABS
3.0000 mg | ORAL_TABLET | Freq: Every evening | ORAL | Status: DC | PRN
  Administered 2023-05-06: 3 mg via ORAL
  Filled 2023-05-05: qty 1

## 2023-05-05 MED ORDER — LISINOPRIL 20 MG PO TABS
20.0000 mg | ORAL_TABLET | Freq: Two times a day (BID) | ORAL | Status: DC
  Administered 2023-05-05 – 2023-05-15 (×19): 20 mg via ORAL
  Filled 2023-05-05 (×20): qty 1

## 2023-05-05 MED ORDER — ACETAMINOPHEN 650 MG RE SUPP: 650.0000 mg | Freq: Four times a day (QID) | RECTAL | Status: DC | PRN

## 2023-05-05 MED ORDER — CLONIDINE HCL 0.1 MG PO TABS
0.2000 mg | ORAL_TABLET | Freq: Two times a day (BID) | ORAL | Status: DC
  Administered 2023-05-05 – 2023-05-15 (×19): 0.2 mg via ORAL
  Filled 2023-05-05 (×3): qty 1
  Filled 2023-05-05: qty 2
  Filled 2023-05-05 (×8): qty 1
  Filled 2023-05-05: qty 2
  Filled 2023-05-05 (×7): qty 1

## 2023-05-05 MED ORDER — DEXAMETHASONE SODIUM PHOSPHATE 4 MG/ML IJ SOLN
4.0000 mg | Freq: Four times a day (QID) | INTRAMUSCULAR | Status: DC
  Administered 2023-05-05 – 2023-05-13 (×29): 4 mg via INTRAVENOUS
  Filled 2023-05-05 (×29): qty 1

## 2023-05-05 MED ORDER — ACETAMINOPHEN 325 MG PO TABS
650.0000 mg | ORAL_TABLET | Freq: Four times a day (QID) | ORAL | Status: DC | PRN
  Administered 2023-05-13 – 2023-05-14 (×2): 650 mg via ORAL
  Filled 2023-05-05 (×2): qty 2

## 2023-05-05 MED ORDER — INSULIN ASPART 100 UNIT/ML IJ SOLN
0.0000 [IU] | Freq: Three times a day (TID) | INTRAMUSCULAR | Status: DC
  Administered 2023-05-06: 3 [IU] via SUBCUTANEOUS
  Administered 2023-05-06: 2 [IU] via SUBCUTANEOUS
  Administered 2023-05-06: 5 [IU] via SUBCUTANEOUS
  Administered 2023-05-07 (×2): 3 [IU] via SUBCUTANEOUS

## 2023-05-05 MED ORDER — CARVEDILOL 12.5 MG PO TABS
12.5000 mg | ORAL_TABLET | Freq: Two times a day (BID) | ORAL | Status: DC
  Administered 2023-05-05 – 2023-05-15 (×19): 12.5 mg via ORAL
  Filled 2023-05-05 (×19): qty 1

## 2023-05-05 MED ORDER — IOHEXOL 350 MG/ML SOLN
100.0000 mL | Freq: Once | INTRAVENOUS | Status: AC | PRN
  Administered 2023-05-05: 75 mL via INTRAVENOUS

## 2023-05-05 MED ORDER — ONDANSETRON HCL 4 MG/2ML IJ SOLN
4.0000 mg | Freq: Four times a day (QID) | INTRAMUSCULAR | Status: DC | PRN
  Administered 2023-05-13: 4 mg via INTRAVENOUS
  Filled 2023-05-05: qty 2

## 2023-05-05 MED ORDER — DEXAMETHASONE SODIUM PHOSPHATE 4 MG/ML IJ SOLN
4.0000 mg | Freq: Once | INTRAMUSCULAR | Status: AC
  Administered 2023-05-05: 4 mg via INTRAVENOUS
  Filled 2023-05-05: qty 1

## 2023-05-05 NOTE — ED Notes (Signed)
[facility] at bedside 

## 2023-05-05 NOTE — Patient Instructions (Addendum)
  Please go to the emergency room downstairs.  Stop driving until we talk again.

## 2023-05-05 NOTE — H&P (Incomplete)
History and Physical      Alex Church:[id number] DOB: [birth year] DOA: 05/05/2023; DOS: 05/05/2023  PCP: Alex Plump, MD *** Patient coming from: home ***  I have personally briefly reviewed patient's old medical records in [facility]  Chief Complaint: ***  HPI: Alex Church is a 77 y.o. male with medical history significant for *** who is admitted to [facility] on 05/05/2023 with *** after presenting from home*** to [facility] ED complaining of ***.    ***       ***   ED Course:  Vital signs in the ED were notable for the following: ***  Labs were notable for the following: ***  Per my interpretation, EKG in ED demonstrated the following:  ***  Imaging in the ED, per corresponding formal radiology read, was notable for the following:  ***  EDP at [facility] d/w on-call neurosurgeon, Dr. Jake Samples , who recommended TRH admission to [facility]. Dr. Jake Samples conveyed that neurosurgery will formally consult, and does not feel that emergent surgery is indicated.  Rather, neurosurgery anticipates surgical intervention on Thursday, 05/07/2023, and recommends interval Decadron 4 mg every 6 hours.   While in the ED, the following were administered: ***  Subsequently, the patient was admitted  ***  ***red    Review of Systems: As per HPI otherwise 10 point review of systems negative.   Past Medical History:  Diagnosis Date   B12 deficiency    monthly shots   Diabetes mellitus    Glaucoma suspect    Hyperlipemia    Hypertension     Past Surgical History:  Procedure Laterality Date   NO PAST SURGERIES      Social History:  reports that he has never smoked. He has never used smokeless tobacco. He reports that he does not drink alcohol and does not use drugs.   No Known Allergies  Family History  Problem Relation Age of Onset   Diabetes Maternal Uncle    Heart attack Brother 89   Cancer Mother        intra-abdominal   Cirrhosis Cousin        maternal   Diabetes  Cousin        maternal   Stroke Neg Hx    Colon cancer Neg Hx    Prostate cancer Neg Hx     Family history reviewed and not pertinent ***   Prior to Admission medications   Medication Sig Start Date End Date Taking? Authorizing Provider  allopurinol (ZYLOPRIM) 100 MG tablet Take 1 tablet (100 mg total) by mouth 2 (two) times daily. 11/25/22   Alex Plump, MD  atorvastatin (LIPITOR) 10 MG tablet Take 1 tablet (10 mg total) by mouth at bedtime. Due for appt 05/2023 03/23/23   Alex Plump, MD  Blood Glucose Monitoring Suppl [facility] VERIO FLEX SYSTEM) w/Device KIT Check blood sugar once daily 05/24/18   Alex Plump, MD  carvedilol (COREG) 12.5 MG tablet Take 1 tablet (12.5 mg total) by mouth 2 (two) times daily with a meal. Due for appt 05/2023 03/23/23   Alex Plump, MD  cloNIDine (CATAPRES) 0.2 MG tablet Take 1 tablet (0.2 mg total) by mouth 2 (two) times daily. 09/09/22   Alex Plump, MD  cyanocobalamin (VITAMIN B12) 1000 MCG tablet Take 1,000 mcg by mouth every other day.    [provider]  famotidine (PEPCID) 20 MG tablet Take 20 mg by mouth 2 (two) times daily.  [provider]  glucose blood test strip Check blood sugars once daily 04/11/20   Alex Plump, MD  JANUVIA 100 MG tablet Take 1 tablet (100 mg total) by mouth daily. 05/04/23   Alex Plump, MD  lisinopril (ZESTRIL) 20 MG tablet Take 1 tablet (20 mg total) by mouth 2 (two) times daily. 05/04/23   Alex Plump, MD  Magnesium Oxide 400 MG CAPS Take 2 capsules (800 mg total) by mouth 2 (two) times daily. 08/18/18   Alex Plump, MD  metFORMIN (GLUCOPHAGE) 1000 MG tablet Take 1 tablet (1,000 mg total) by mouth 2 (two) times daily with a meal. 03/02/23   Alex Plump, MD  ONE [facility] LANCETS MISC Check blood sugar once daily 05/24/18   Alex Plump, MD  pioglitazone (ACTOS) 30 MG tablet Take 1 tablet (30 mg total) by mouth daily. 01/29/23   Alex Plump, MD     Objective    Physical Exam: Vitals:   05/05/23 1630 05/05/23 1700  05/05/23 1959 05/05/23 2000  BP: (!) 148/77 (!) 164/76  (!) 149/97  Pulse: (!) 55 68  71  Resp: 17 (!) 21  19  Temp:  98.3 F (36.8 C)  98.1 F (36.7 C)  TempSrc:  Oral  Oral  SpO2: 98% 93%    Weight:   87.5 kg   Height:   6' (1.829 m)     General: appears to be stated age; alert, oriented Skin: warm, dry, no rash Head:  AT/[place] Mouth:  Oral mucosa membranes appear moist, normal dentition Neck: supple; trachea midline Heart:  RRR; did not appreciate any M/R/G Lungs: CTAB, did not appreciate any wheezes, rales, or rhonchi Abdomen: + BS; soft, ND, NT Vascular: 2+ pedal pulses b/l; 2+ radial pulses b/l Extremities: no peripheral edema, no muscle wasting Neuro: strength and sensation intact in upper and lower extremities b/l ***   *** Neuro: 5/5 strength of the proximal and distal flexors and extensors of the upper and lower extremities bilaterally; sensation intact in upper and lower extremities b/l; cranial nerves II through XII grossly intact; no pronator drift; no evidence suggestive of slurred speech, dysarthria, or facial droop; Normal muscle tone. No tremors.  *** Neuro: In the setting of the patient's current mental status and associated inability to follow instructions, unable to perform full neurologic exam at this time.  As such, assessment of strength, sensation, and cranial nerves is limited at this time. Patient noted to spontaneously move all 4 extremities. No tremors.  ***    Labs on Admission: I have personally reviewed following labs and imaging studies  CBC: Recent Labs  Lab 05/05/23 1301  WBC 9.1  NEUTROABS 6.6  HGB 12.5*  HCT 38.6*  MCV 93.0  PLT 202   Basic Metabolic Panel: Recent Labs  Lab 05/05/23 1301  NA 136  K 4.3  CL 101  CO2 25  GLUCOSE 148*  BUN 18  CREATININE 1.20  CALCIUM 9.1   GFR: Estimated Creatinine Clearance: 57.5 mL/min (by C-G formula based on SCr of 1.2 mg/dL). Liver Function Tests: Recent Labs  Lab 05/05/23 1301   AST 18  ALT 15  ALKPHOS 62  BILITOT 0.5  PROT 7.2  ALBUMIN 3.8   No results for input(s): "LIPASE", "AMYLASE" in the last 168 hours. No results for input(s): "AMMONIA" in the last 168 hours. Coagulation Profile: No results for input(s): "INR", "PROTIME" in the last 168 hours. Cardiac Enzymes: No results for input(s): "CKTOTAL", "CKMB", "CKMBINDEX", "  TROPONINI" in the last 168 hours. BNP (last 3 results) No results for input(s): "PROBNP" in the last 8760 hours. HbA1C: No results for input(s): "HGBA1C" in the last 72 hours. CBG: Recent Labs  Lab 05/05/23 1240 05/05/23 1420  GLUCAP 144* 148*   Lipid Profile: No results for input(s): "CHOL", "HDL", "LDLCALC", "TRIG", "CHOLHDL", "LDLDIRECT" in the last 72 hours. Thyroid Function Tests: No results for input(s): "TSH", "T4TOTAL", "FREET4", "T3FREE", "THYROIDAB" in the last 72 hours. Anemia Panel: No results for input(s): "VITAMINB12", "FOLATE", "FERRITIN", "TIBC", "IRON", "RETICCTPCT" in the last 72 hours. Urine analysis:    Component Value Date/Time   COLORURINE YELLOW 05/05/2023 1411   APPEARANCEUR CLEAR 05/05/2023 1411   LABSPEC 1.020 05/05/2023 1411   PHURINE 5.0 05/05/2023 1411   GLUCOSEU NEGATIVE 05/05/2023 1411   HGBUR NEGATIVE 05/05/2023 1411   BILIRUBINUR NEGATIVE 05/05/2023 1411   KETONESUR NEGATIVE 05/05/2023 1411   PROTEINUR NEGATIVE 05/05/2023 1411   NITRITE NEGATIVE 05/05/2023 1411   LEUKOCYTESUR NEGATIVE 05/05/2023 1411    Radiological Exams on Admission: CT ANGIO HEAD NECK W WO CM  Result Date: 05/05/2023 CLINICAL DATA:  Fall.  Left-sided weakness. EXAM: CT CERVICAL SPINE WITHOUT CONTRAST CT ANGIOGRAPHY HEAD AND NECK WITH AND WITHOUT CONTRAST TECHNIQUE: Multidetector CT imaging of the cervical spine was performed without intravenous contrast. Multiplanar CT image reconstructions were also generated. Multidetector CT imaging of the head and neck was performed using the standard protocol during bolus  administration of intravenous contrast. Multiplanar CT image reconstructions and MIPs were obtained to evaluate the vascular anatomy. Carotid stenosis measurements (when applicable) are obtained utilizing NASCET criteria, using the distal internal carotid diameter as the denominator. RADIATION DOSE REDUCTION: This exam was performed according to the departmental dose-optimization program which includes automated exposure control, adjustment of the mA and/or kV according to patient size and/or use of iterative reconstruction technique. CONTRAST:  75mL OMNIPAQUE IOHEXOL 350 MG/ML SOLN COMPARISON:  None Available. FINDINGS: CT HEAD FINDINGS Brain: There is a large 5.2 x 4.2 x 5.2 cm peripherally contrast-enhancing mass centered in the right frontal lobe. This mass is likely centrally necrotic. There is vasogenic edema surrounding this lesion with proximally 6 mm leftward midline shift. No hydrocephalus. No extra-axial fluid collection. No CT evidence of an acute cortical infarct. Vascular: No hyperdense vessel or unexpected calcification. Skull: Normal. Negative for fracture or focal lesion. Sinuses/Orbits: No middle ear or mastoid effusion. Paranasal sinuses are clear. Orbits are unremarkable. Other: None. Review of the MIP images confirms the above findings CT CERVICAL SPINE FINDINGS: Alignment: Grade 1 anterolisthesis of C3 on C4 and C4 on C5. Skull base and vertebrae: No acute fracture. No primary bone lesion or focal pathologic process. Assessment of the lower cervical spine is limited due to a combination of streak artifact and osteopenia. Soft tissues and spinal canal: No prevertebral fluid or swelling. No visible canal hematoma. Disc levels:  No evidence of high-grade spinal canal stenosis. Upper chest: Negative. Other: None CTA NECK FINDINGS Aortic arch: Standard branching. Imaged portion shows no evidence of aneurysm or dissection. No significant stenosis of the major arch vessel origins. Right carotid  system: No evidence of dissection, stenosis (50% or greater), or occlusion. Left carotid system: No evidence of dissection, stenosis (50% or greater), or occlusion. Vertebral arteries: Moderate to severe narrowing of the origin of the right vertebral artery. Moderate to severe narrowing in the V4 segment of the right vertebral artery. Mild narrowing of the origin of the left vertebral artery. No evidence of a dissection. Codominant.  Skeleton: See above Other neck: Negative. Upper chest: Negative. Review of the MIP images confirms the above findings CTA HEAD FINDINGS Anterior circulation: No significant stenosis, proximal occlusion, aneurysm, or vascular malformation. Posterior circulation: Mild narrowing of the P1 P2 junction of the left PCA. Otherwise no proximal occlusion, aneurysm, or vascular malformation. Venous sinuses: As permitted by contrast timing, patent. Anatomic variants: None Review of the MIP images confirms the above findings IMPRESSION: 1. Large 5.2 x 4.2 x 5.2 cm peripherally contrast-enhancing mass centered in the right frontal lobe with surrounding vasogenic edema and approximately 6 mm leftward midline shift. This could either represent a solitary metastatic lesion or a high grade glial neoplasm. Recommend brain MRI with and without contrast for further evaluation. 2. No acute fracture or traumatic listhesis of the cervical spine. 3. Moderate to severe narrowing of the origin of the right vertebral artery and V4 segment of the right vertebral artery. Electronically Signed   By: Lorenza Cambridge M.D.   On: 05/05/2023 16:26   CT C-SPINE NO CHARGE  Result Date: 05/05/2023 CLINICAL DATA:  Fall.  Left-sided weakness. EXAM: CT CERVICAL SPINE WITHOUT CONTRAST CT ANGIOGRAPHY HEAD AND NECK WITH AND WITHOUT CONTRAST TECHNIQUE: Multidetector CT imaging of the cervical spine was performed without intravenous contrast. Multiplanar CT image reconstructions were also generated. Multidetector CT imaging of the  head and neck was performed using the standard protocol during bolus administration of intravenous contrast. Multiplanar CT image reconstructions and MIPs were obtained to evaluate the vascular anatomy. Carotid stenosis measurements (when applicable) are obtained utilizing NASCET criteria, using the distal internal carotid diameter as the denominator. RADIATION DOSE REDUCTION: This exam was performed according to the departmental dose-optimization program which includes automated exposure control, adjustment of the mA and/or kV according to patient size and/or use of iterative reconstruction technique. CONTRAST:  75mL OMNIPAQUE IOHEXOL 350 MG/ML SOLN COMPARISON:  None Available. FINDINGS: CT HEAD FINDINGS Brain: There is a large 5.2 x 4.2 x 5.2 cm peripherally contrast-enhancing mass centered in the right frontal lobe. This mass is likely centrally necrotic. There is vasogenic edema surrounding this lesion with proximally 6 mm leftward midline shift. No hydrocephalus. No extra-axial fluid collection. No CT evidence of an acute cortical infarct. Vascular: No hyperdense vessel or unexpected calcification. Skull: Normal. Negative for fracture or focal lesion. Sinuses/Orbits: No middle ear or mastoid effusion. Paranasal sinuses are clear. Orbits are unremarkable. Other: None. Review of the MIP images confirms the above findings CT CERVICAL SPINE FINDINGS: Alignment: Grade 1 anterolisthesis of C3 on C4 and C4 on C5. Skull base and vertebrae: No acute fracture. No primary bone lesion or focal pathologic process. Assessment of the lower cervical spine is limited due to a combination of streak artifact and osteopenia. Soft tissues and spinal canal: No prevertebral fluid or swelling. No visible canal hematoma. Disc levels:  No evidence of high-grade spinal canal stenosis. Upper chest: Negative. Other: None CTA NECK FINDINGS Aortic arch: Standard branching. Imaged portion shows no evidence of aneurysm or dissection. No  significant stenosis of the major arch vessel origins. Right carotid system: No evidence of dissection, stenosis (50% or greater), or occlusion. Left carotid system: No evidence of dissection, stenosis (50% or greater), or occlusion. Vertebral arteries: Moderate to severe narrowing of the origin of the right vertebral artery. Moderate to severe narrowing in the V4 segment of the right vertebral artery. Mild narrowing of the origin of the left vertebral artery. No evidence of a dissection. Codominant. Skeleton: See above Other neck: Negative. Upper  chest: Negative. Review of the MIP images confirms the above findings CTA HEAD FINDINGS Anterior circulation: No significant stenosis, proximal occlusion, aneurysm, or vascular malformation. Posterior circulation: Mild narrowing of the P1 P2 junction of the left PCA. Otherwise no proximal occlusion, aneurysm, or vascular malformation. Venous sinuses: As permitted by contrast timing, patent. Anatomic variants: None Review of the MIP images confirms the above findings IMPRESSION: 1. Large 5.2 x 4.2 x 5.2 cm peripherally contrast-enhancing mass centered in the right frontal lobe with surrounding vasogenic edema and approximately 6 mm leftward midline shift. This could either represent a solitary metastatic lesion or a high grade glial neoplasm. Recommend brain MRI with and without contrast for further evaluation. 2. No acute fracture or traumatic listhesis of the cervical spine. 3. Moderate to severe narrowing of the origin of the right vertebral artery and V4 segment of the right vertebral artery. Electronically Signed   By: Lorenza Cambridge M.D.   On: 05/05/2023 16:26   DG Chest Portable 1 View  Result Date: 05/05/2023 CLINICAL DATA:  Chest pain EXAM: PORTABLE CHEST 1 VIEW COMPARISON:  None Available. FINDINGS: Slight linear opacity left lung base likely scar or atelectasis. No consolidation, pneumothorax or effusion. No edema. Calcified aorta. Overlapping cardiac leads.  IMPRESSION: Minimal left basilar atelectasis or scar. No acute cardiopulmonary disease Electronically Signed   By: Karen Kays M.D.   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 Jake Samples , who recommended TR[facility] admission to [facility]. Dr. Jake Samples conveyed that neurosurgery will formally consult, and does not feel that emergent surgery is indicated.  Rather, neurosurgery anticipates surgical intervention on Thursday, 05/07/2023, and recommends interval Decadron 4 mg every 6 hours.          ***                 ***               ***               ***               ***                ***               ***               ***               ***               ***              ***          ***  DVT prophylaxis: SCD's ***  Code Status: Full code*** Family Communication: none*** Disposition Plan: Per Rounding Team Consults called: none***;  Admission status: ***     I SPENT GREATER THAN 75 *** MINUTES IN CLINICAL CARE TIME/MEDICAL DECISION-MAKING IN COMPLETING THIS ADMISSION.      Chaney Born Tanganika Barradas DO [facility]  From 7PM - 7AM   05/05/2023, 8:31 PM   ***

## 2023-05-05 NOTE — Progress Notes (Signed)
Case d/w Dr. Dawley/Dr. Rubin Payor. Pt reportedly had a fall a couple of days ago and some ongoing confusion for 1+mos. On exam pt does have some L sided weakness but is A&O w/o other focal deficits noted. CTH showing large R frontal lobe mass w/ surrounding edema. Recommend TRH admit. MRI w/ and w/o. Can consider Decadron 4mg  Q6H. NSGY to formally consult thereafter. Call w/ questions/concerns.   Alex Church Margaree Mackintosh, PA-C

## 2023-05-05 NOTE — Telephone Encounter (Signed)
Appt added

## 2023-05-05 NOTE — ED Provider Notes (Signed)
  Physical Exam  BP (!) 148/69   Pulse 69   Temp (!) 97.1 F (36.2 C) (Oral)   Resp (!) 22   SpO2 99%   Physical Exam  Procedures  Procedures  ED Course / MDM    Medical Decision Making Amount and/or Complexity of Data Reviewed Labs: ordered. Radiology: ordered.  Risk Prescription drug management. Decision regarding hospitalization.   Patient for confusion and weakness.  Is following commands.  Unfortunately found brain mass.  Discussed with neurosurgery.  They recommend admission to internal medicine.  No OR tonight but potentially Thursday.  Recommended Decadron 4 mg every 6 hours.  They will round on him tomorrow.  I have ordered the MRI.   I discussed with Dr. Natale Milch from the hospitalist service       Benjiman Core, MD 05/05/23 [phone]

## 2023-05-05 NOTE — ED Triage Notes (Signed)
Per family , pt had fall 2 days ago . Pt is confused per family . Yet been confused progressively x 3 months , some concerns for dementia but no diagnosis .  Having difficulty following directions and commands per provider at bedside .  Alert and oriented x 4 .

## 2023-05-05 NOTE — ED Provider Notes (Signed)
[facility] Provider Note   CSN: [id number] Arrival date & time: 05/05/23  1156     History  Chief Complaint  Patient presents with   Alex Church is a 77 y.o. male.  HPI      77yo male with history of DM, htn, hlpd who presents with concern for falls and left sided weakness beginning over the weekend with background of some mental status changes over months.  Has been progressively confused since May/June, appears to have memory loss, getting lost while driving This weekend, had 2 falls and could not stand up on his own after them Wife thought he had left sided weakness and was leaning towards the left since about Saturday, noted his gait was unsteady. She had tried to get him to go to the ED but he declined.  He reports his Dr. Wynetta Fines him last night and also told him to go to the ED but he did not want to go to ED, did agree to see him in the AM>  Dr. Drue Novel saw him this AM and recommended he come to ED.    Has had occasional nausea or vomiting over the past 2 weeks, none today  Past Medical History:  Diagnosis Date   B12 deficiency    monthly shots   Diabetes mellitus    Glaucoma suspect    Hyperlipemia    Hypertension      Home Medications Prior to Admission medications   Medication Sig Start Date End Date Taking? Authorizing Provider  allopurinol (ZYLOPRIM) 100 MG tablet Take 1 tablet (100 mg total) by mouth 2 (two) times daily. 11/25/22   Wanda Plump, MD  atorvastatin (LIPITOR) 10 MG tablet Take 1 tablet (10 mg total) by mouth at bedtime. Due for appt 05/2023 03/23/23   Wanda Plump, MD  Blood Glucose Monitoring Suppl [facility] VERIO FLEX SYSTEM) w/Device KIT Check blood sugar once daily 05/24/18   Wanda Plump, MD  carvedilol (COREG) 12.5 MG tablet Take 1 tablet (12.5 mg total) by mouth 2 (two) times daily with a meal. Due for appt 05/2023 03/23/23   Wanda Plump, MD  cloNIDine (CATAPRES) 0.2 MG tablet Take 1 tablet (0.2  mg total) by mouth 2 (two) times daily. 09/09/22   Wanda Plump, MD  cyanocobalamin (VITAMIN B12) 1000 MCG tablet Take 1,000 mcg by mouth every other day.    [provider]  famotidine (PEPCID) 20 MG tablet Take 20 mg by mouth 2 (two) times daily.    [provider]  glucose blood test strip Check blood sugars once daily 04/11/20   Wanda Plump, MD  JANUVIA 100 MG tablet Take 1 tablet (100 mg total) by mouth daily. 05/04/23   Wanda Plump, MD  lisinopril (ZESTRIL) 20 MG tablet Take 1 tablet (20 mg total) by mouth 2 (two) times daily. 05/04/23   Wanda Plump, MD  Magnesium Oxide 400 MG CAPS Take 2 capsules (800 mg total) by mouth 2 (two) times daily. 08/18/18   Wanda Plump, MD  metFORMIN (GLUCOPHAGE) 1000 MG tablet Take 1 tablet (1,000 mg total) by mouth 2 (two) times daily with a meal. 03/02/23   Wanda Plump, MD  ONE [facility] LANCETS MISC Check blood sugar once daily 05/24/18   Wanda Plump, MD  pioglitazone (ACTOS) 30 MG tablet Take 1 tablet (30 mg total) by mouth daily. 01/29/23   Wanda Plump, MD  Allergies    Patient has no known allergies.    Review of Systems   Review of Systems  Physical Exam Updated Vital Signs BP (!) 164/76   Pulse 68   Temp 98.3 F (36.8 C) (Oral)   Resp (!) 21   SpO2 93%  Physical Exam Vitals and nursing note reviewed.  Constitutional:      General: He is not in acute distress.    Appearance: Normal appearance. He is well-developed. He is not ill-appearing or diaphoretic.  HENT:     Head: Normocephalic and atraumatic.  Eyes:     General: No visual field deficit.    Extraocular Movements: Extraocular movements intact.     Conjunctiva/sclera: Conjunctivae normal.     Pupils: Pupils are equal, round, and reactive to light.  Cardiovascular:     Rate and Rhythm: Normal rate and regular rhythm.     Pulses: Normal pulses.     Heart sounds: Normal heart sounds. No murmur heard.    No friction rub. No gallop.  Pulmonary:     Effort: Pulmonary effort  is normal. No respiratory distress.     Breath sounds: Normal breath sounds. No wheezing or rales.  Abdominal:     General: There is no distension.     Palpations: Abdomen is soft.     Tenderness: There is no abdominal tenderness. There is no guarding.  Musculoskeletal:        General: No swelling or tenderness.     Cervical back: Normal range of motion.  Skin:    General: Skin is warm and dry.     Findings: No erythema or rash.  Neurological:     General: No focal deficit present.     Mental Status: He is alert and oriented to person, place, and time.     GCS: GCS eye subscore is 4. GCS verbal subscore is 5. GCS motor subscore is 6.     Cranial Nerves: No cranial nerve deficit, dysarthria or facial asymmetry.     Sensory: No sensory deficit.     Motor: Weakness (left arm and leg) present. No tremor.     Coordination: Coordination normal. Finger-Nose-Finger Test normal.     Gait: Gait normal.     Comments: When asked to close eyes for double extinction does open eyes, when asked to look at nose for peripheral fields continues to look at fingers/visual field testing limited Able to name, repeat      ED Results / Procedures / Treatments   Labs (all labs ordered are listed, but only abnormal results are displayed) Labs Reviewed  CBC WITH DIFFERENTIAL/PLATELET - Abnormal; Notable for the following components:      Result Value   RBC 4.15 (*)    Hemoglobin 12.5 (*)    HCT 38.6 (*)    All other components within normal limits  COMPREHENSIVE METABOLIC PANEL - Abnormal; Notable for the following components:   Glucose, Bld 148 (*)    All other components within normal limits  URINALYSIS, W/ REFLEX TO CULTURE (INFECTION SUSPECTED) - Abnormal; Notable for the following components:   Bacteria, UA RARE (*)    All other components within normal limits  CBG MONITORING, ED - Abnormal; Notable for the following components:   Glucose-Capillary 144 (*)    All other components within normal  limits  CBG MONITORING, ED - Abnormal; Notable for the following components:   Glucose-Capillary 148 (*)    All other components within normal limits  SARS CORONAVIRUS 2  BY RT PCR    EKG EKG Interpretation Date/Time:  Tuesday May 05 2023 12:37:49 EDT Ventricular Rate:  70 PR Interval:  61 QRS Duration:  97 QT Interval:  415 QTC Calculation: 448 R Axis:   42  Text Interpretation: Sinus rhythm Short PR interval No previous ECGs available Confirmed by Alvira Monday ([id number]) on 05/05/2023 1:11:31 PM  Radiology CT ANGIO HEAD NECK W WO CM  Result Date: 05/05/2023 CLINICAL DATA:  Fall.  Left-sided weakness. EXAM: CT CERVICAL SPINE WITHOUT CONTRAST CT ANGIOGRAPHY HEAD AND NECK WITH AND WITHOUT CONTRAST TECHNIQUE: Multidetector CT imaging of the cervical spine was performed without intravenous contrast. Multiplanar CT image reconstructions were also generated. Multidetector CT imaging of the head and neck was performed using the standard protocol during bolus administration of intravenous contrast. Multiplanar CT image reconstructions and MIPs were obtained to evaluate the vascular anatomy. Carotid stenosis measurements (when applicable) are obtained utilizing NASCET criteria, using the distal internal carotid diameter as the denominator. RADIATION DOSE REDUCTION: This exam was performed according to the departmental dose-optimization program which includes automated exposure control, adjustment of the mA and/or kV according to patient size and/or use of iterative reconstruction technique. CONTRAST:  75mL OMNIPAQUE IOHEXOL 350 MG/ML SOLN COMPARISON:  None Available. FINDINGS: CT HEAD FINDINGS Brain: There is a large 5.2 x 4.2 x 5.2 cm peripherally contrast-enhancing mass centered in the right frontal lobe. This mass is likely centrally necrotic. There is vasogenic edema surrounding this lesion with proximally 6 mm leftward midline shift. No hydrocephalus. No extra-axial fluid collection. No CT  evidence of an acute cortical infarct. Vascular: No hyperdense vessel or unexpected calcification. Skull: Normal. Negative for fracture or focal lesion. Sinuses/Orbits: No middle ear or mastoid effusion. Paranasal sinuses are clear. Orbits are unremarkable. Other: None. Review of the MIP images confirms the above findings CT CERVICAL SPINE FINDINGS: Alignment: Grade 1 anterolisthesis of C3 on C4 and C4 on C5. Skull base and vertebrae: No acute fracture. No primary bone lesion or focal pathologic process. Assessment of the lower cervical spine is limited due to a combination of streak artifact and osteopenia. Soft tissues and spinal canal: No prevertebral fluid or swelling. No visible canal hematoma. Disc levels:  No evidence of high-grade spinal canal stenosis. Upper chest: Negative. Other: None CTA NECK FINDINGS Aortic arch: Standard branching. Imaged portion shows no evidence of aneurysm or dissection. No significant stenosis of the major arch vessel origins. Right carotid system: No evidence of dissection, stenosis (50% or greater), or occlusion. Left carotid system: No evidence of dissection, stenosis (50% or greater), or occlusion. Vertebral arteries: Moderate to severe narrowing of the origin of the right vertebral artery. Moderate to severe narrowing in the V4 segment of the right vertebral artery. Mild narrowing of the origin of the left vertebral artery. No evidence of a dissection. Codominant. Skeleton: See above Other neck: Negative. Upper chest: Negative. Review of the MIP images confirms the above findings CTA HEAD FINDINGS Anterior circulation: No significant stenosis, proximal occlusion, aneurysm, or vascular malformation. Posterior circulation: Mild narrowing of the P1 P2 junction of the left PCA. Otherwise no proximal occlusion, aneurysm, or vascular malformation. Venous sinuses: As permitted by contrast timing, patent. Anatomic variants: None Review of the MIP images confirms the above findings  IMPRESSION: 1. Large 5.2 x 4.2 x 5.2 cm peripherally contrast-enhancing mass centered in the right frontal lobe with surrounding vasogenic edema and approximately 6 mm leftward midline shift. This could either represent a solitary metastatic lesion or a high grade  glial neoplasm. Recommend brain MRI with and without contrast for further evaluation. 2. No acute fracture or traumatic listhesis of the cervical spine. 3. Moderate to severe narrowing of the origin of the right vertebral artery and V4 segment of the right vertebral artery. Electronically Signed   By: Lorenza Cambridge M.D.   On: 05/05/2023 16:26   CT C-SPINE NO CHARGE  Result Date: 05/05/2023 CLINICAL DATA:  Fall.  Left-sided weakness. EXAM: CT CERVICAL SPINE WITHOUT CONTRAST CT ANGIOGRAPHY HEAD AND NECK WITH AND WITHOUT CONTRAST TECHNIQUE: Multidetector CT imaging of the cervical spine was performed without intravenous contrast. Multiplanar CT image reconstructions were also generated. Multidetector CT imaging of the head and neck was performed using the standard protocol during bolus administration of intravenous contrast. Multiplanar CT image reconstructions and MIPs were obtained to evaluate the vascular anatomy. Carotid stenosis measurements (when applicable) are obtained utilizing NASCET criteria, using the distal internal carotid diameter as the denominator. RADIATION DOSE REDUCTION: This exam was performed according to the departmental dose-optimization program which includes automated exposure control, adjustment of the mA and/or kV according to patient size and/or use of iterative reconstruction technique. CONTRAST:  75mL OMNIPAQUE IOHEXOL 350 MG/ML SOLN COMPARISON:  None Available. FINDINGS: CT HEAD FINDINGS Brain: There is a large 5.2 x 4.2 x 5.2 cm peripherally contrast-enhancing mass centered in the right frontal lobe. This mass is likely centrally necrotic. There is vasogenic edema surrounding this lesion with proximally 6 mm leftward  midline shift. No hydrocephalus. No extra-axial fluid collection. No CT evidence of an acute cortical infarct. Vascular: No hyperdense vessel or unexpected calcification. Skull: Normal. Negative for fracture or focal lesion. Sinuses/Orbits: No middle ear or mastoid effusion. Paranasal sinuses are clear. Orbits are unremarkable. Other: None. Review of the MIP images confirms the above findings CT CERVICAL SPINE FINDINGS: Alignment: Grade 1 anterolisthesis of C3 on C4 and C4 on C5. Skull base and vertebrae: No acute fracture. No primary bone lesion or focal pathologic process. Assessment of the lower cervical spine is limited due to a combination of streak artifact and osteopenia. Soft tissues and spinal canal: No prevertebral fluid or swelling. No visible canal hematoma. Disc levels:  No evidence of high-grade spinal canal stenosis. Upper chest: Negative. Other: None CTA NECK FINDINGS Aortic arch: Standard branching. Imaged portion shows no evidence of aneurysm or dissection. No significant stenosis of the major arch vessel origins. Right carotid system: No evidence of dissection, stenosis (50% or greater), or occlusion. Left carotid system: No evidence of dissection, stenosis (50% or greater), or occlusion. Vertebral arteries: Moderate to severe narrowing of the origin of the right vertebral artery. Moderate to severe narrowing in the V4 segment of the right vertebral artery. Mild narrowing of the origin of the left vertebral artery. No evidence of a dissection. Codominant. Skeleton: See above Other neck: Negative. Upper chest: Negative. Review of the MIP images confirms the above findings CTA HEAD FINDINGS Anterior circulation: No significant stenosis, proximal occlusion, aneurysm, or vascular malformation. Posterior circulation: Mild narrowing of the P1 P2 junction of the left PCA. Otherwise no proximal occlusion, aneurysm, or vascular malformation. Venous sinuses: As permitted by contrast timing, patent. Anatomic  variants: None Review of the MIP images confirms the above findings IMPRESSION: 1. Large 5.2 x 4.2 x 5.2 cm peripherally contrast-enhancing mass centered in the right frontal lobe with surrounding vasogenic edema and approximately 6 mm leftward midline shift. This could either represent a solitary metastatic lesion or a high grade glial neoplasm. Recommend brain MRI with and  without contrast for further evaluation. 2. No acute fracture or traumatic listhesis of the cervical spine. 3. Moderate to severe narrowing of the origin of the right vertebral artery and V4 segment of the right vertebral artery. Electronically Signed   By: Lorenza Cambridge M.D.   On: 05/05/2023 16:26   DG Chest Portable 1 View  Result Date: 05/05/2023 CLINICAL DATA:  Chest pain EXAM: PORTABLE CHEST 1 VIEW COMPARISON:  None Available. FINDINGS: Slight linear opacity left lung base likely scar or atelectasis. No consolidation, pneumothorax or effusion. No edema. Calcified aorta. Overlapping cardiac leads. IMPRESSION: Minimal left basilar atelectasis or scar. No acute cardiopulmonary disease Electronically Signed   By: Karen Kays M.D.   On: 05/05/2023 14:56    Procedures Procedures    Medications Ordered in ED Medications  iohexol (OMNIPAQUE) 350 MG/ML injection 100 mL (75 mLs Intravenous Contrast Given 05/05/23 1348)  dexamethasone (DECADRON) injection 4 mg (4 mg Intravenous Given 05/05/23 1737)    ED Course/ Medical Decision Making/ A&P                                  77yo male with history of DM, htn, hlpd who presents with concern for falls and left sided weakness beginning over the weekend with background of some mental status changes over months.  DDx includes ICH, CVA, mass, electrolyte abnormalities, infection.  Out of Code Stroke window.   EKG completed and personally evaluated and interpreted by me with normal sinus rhythm.   CXR without acute findings, minimal atelectasis or scar.    Labs evaluated by me  without clinically significant abnormalities, no UTI.   Ordered CTA head/neck with clinical concern or CVA and to evaluate for occlussive disease, ICH or other abnormalities.    CT completed and evaluated by me shows concern for brain mass with midline shift. Discussed with Dr. Selina Cooley Neurology. Placed consult to [facility].  Discussed findings with patient and his sister-in-law--radiology read pending.  Plan for admission> Will await [facility] recommendations. Singed out to Dr. Rubin Payor with this consult pending.          Final Clinical Impression(s) / ED Diagnoses Final diagnoses:  Brain mass  Left-sided weakness    Rx / DC Orders ED Discharge Orders     None         Alvira Monday, MD 05/05/23 1756
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
## 2023-05-06 ENCOUNTER — Inpatient Hospital Stay (HOSPITAL_COMMUNITY): Payer: Medicare Other

## 2023-05-06 ENCOUNTER — Encounter: Payer: Self-pay | Admitting: Internal Medicine

## 2023-05-06 ENCOUNTER — Encounter (HOSPITAL_COMMUNITY): Payer: Self-pay | Admitting: Internal Medicine

## 2023-05-06 DIAGNOSIS — D499 Neoplasm of unspecified behavior of unspecified site: Secondary | ICD-10-CM | POA: Diagnosis not present

## 2023-05-06 DIAGNOSIS — G9340 Encephalopathy, unspecified: Secondary | ICD-10-CM | POA: Diagnosis present

## 2023-05-06 DIAGNOSIS — R531 Weakness: Secondary | ICD-10-CM

## 2023-05-06 DIAGNOSIS — E119 Type 2 diabetes mellitus without complications: Secondary | ICD-10-CM | POA: Diagnosis not present

## 2023-05-06 DIAGNOSIS — I10 Essential (primary) hypertension: Secondary | ICD-10-CM | POA: Diagnosis not present

## 2023-05-06 LAB — CBC WITH DIFFERENTIAL/PLATELET
Abs Immature Granulocytes: 0.06 10*3/uL (ref 0.00–0.07)
Basophils Absolute: 0.1 10*3/uL (ref 0.0–0.1)
Basophils Relative: 1 %
Eosinophils Absolute: 0 10*3/uL (ref 0.0–0.5)
Eosinophils Relative: 0 %
HCT: 37.2 % — ABNORMAL LOW (ref 39.0–52.0)
Hemoglobin: 12.5 g/dL — ABNORMAL LOW (ref 13.0–17.0)
Immature Granulocytes: 1 %
Lymphocytes Relative: 6 %
Lymphs Abs: 0.5 10*3/uL — ABNORMAL LOW (ref 0.7–4.0)
MCH: 31.1 pg (ref 26.0–34.0)
MCHC: 33.6 g/dL (ref 30.0–36.0)
MCV: 92.5 fL (ref 80.0–100.0)
Monocytes Absolute: 0.2 10*3/uL (ref 0.1–1.0)
Monocytes Relative: 2 %
Neutro Abs: 8.8 10*3/uL — ABNORMAL HIGH (ref 1.7–7.7)
Neutrophils Relative %: 90 %
Platelets: 198 10*3/uL (ref 150–400)
RBC: 4.02 MIL/uL — ABNORMAL LOW (ref 4.22–5.81)
RDW: 14.6 % (ref 11.5–15.5)
WBC: 9.7 10*3/uL (ref 4.0–10.5)
nRBC: 0 % (ref 0.0–0.2)

## 2023-05-06 LAB — COMPREHENSIVE METABOLIC PANEL
ALT: 17 U/L (ref 0–44)
AST: 18 U/L (ref 15–41)
Albumin: 3.4 g/dL — ABNORMAL LOW (ref 3.5–5.0)
Alkaline Phosphatase: 70 U/L (ref 38–126)
Anion gap: 13 (ref 5–15)
BUN: 17 mg/dL (ref 8–23)
CO2: 22 mmol/L (ref 22–32)
Calcium: 9 mg/dL (ref 8.9–10.3)
Chloride: 100 mmol/L (ref 98–111)
Creatinine, Ser: 1.17 mg/dL (ref 0.61–1.24)
GFR, Estimated: >60 mL/min (ref >60)
Glucose, Bld: 277 mg/dL — ABNORMAL HIGH (ref 70–99)
Potassium: 4.4 mmol/L (ref 3.5–5.1)
Sodium: 135 mmol/L (ref 135–145)
Total Bilirubin: 0.7 mg/dL (ref 0.3–1.2)
Total Protein: 6.6 g/dL (ref 6.5–8.1)

## 2023-05-06 LAB — HEMOGLOBIN A1C
Hgb A1c MFr Bld: 6.9 % — ABNORMAL HIGH (ref 4.8–5.6)
Mean Plasma Glucose: 151.33 mg/dL

## 2023-05-06 LAB — VITAMIN B12: Vitamin B-12: 1397 pg/mL — ABNORMAL HIGH (ref 180–914)

## 2023-05-06 LAB — MAGNESIUM
Magnesium: 1.1 mg/dL — ABNORMAL LOW (ref 1.7–2.4)
Magnesium: 1.1 mg/dL — ABNORMAL LOW (ref 1.7–2.4)

## 2023-05-06 LAB — CK: Total CK: 74 U/L (ref 49–397)

## 2023-05-06 LAB — PROTIME-INR
INR: 1.1 (ref 0.8–1.2)
Prothrombin Time: 14 seconds (ref 11.4–15.2)

## 2023-05-06 LAB — GLUCOSE, CAPILLARY
Glucose-Capillary: 285 mg/dL — ABNORMAL HIGH (ref 70–99)
Glucose-Capillary: 193 mg/dL — ABNORMAL HIGH (ref 70–99)
Glucose-Capillary: 224 mg/dL — ABNORMAL HIGH (ref 70–99)
Glucose-Capillary: 236 mg/dL — ABNORMAL HIGH (ref 70–99)

## 2023-05-06 LAB — ABO/RH: ABO/RH(D): B POS

## 2023-05-06 LAB — TSH: TSH: 0.39 u[IU]/mL (ref 0.350–4.500)

## 2023-05-06 LAB — AMMONIA: Ammonia: 26 umol/L (ref 9–35)

## 2023-05-06 LAB — TYPE AND SCREEN
ABO/RH(D): B POS
Antibody Screen: NEGATIVE

## 2023-05-06 MED ORDER — MAGNESIUM SULFATE 4 GM/100ML IV SOLN
4.0000 g | Freq: Once | INTRAVENOUS | Status: AC
  Administered 2023-05-06: 4 g via INTRAVENOUS
  Filled 2023-05-06: qty 100

## 2023-05-06 MED ORDER — ATORVASTATIN CALCIUM 10 MG PO TABS
10.0000 mg | ORAL_TABLET | Freq: Every day | ORAL | Status: DC
  Administered 2023-05-06 – 2023-05-14 (×9): 10 mg via ORAL
  Filled 2023-05-06 (×9): qty 1

## 2023-05-06 MED ORDER — IOHEXOL 9 MG/ML PO SOLN
1000.0000 mL | ORAL | Status: AC
  Administered 2023-05-06 (×2): 1000 mL via ORAL

## 2023-05-06 MED ORDER — IOHEXOL 350 MG/ML SOLN
75.0000 mL | Freq: Once | INTRAVENOUS | Status: AC | PRN
  Administered 2023-05-06: 75 mL via INTRAVENOUS

## 2023-05-06 MED ORDER — FAMOTIDINE 20 MG PO TABS
20.0000 mg | ORAL_TABLET | Freq: Two times a day (BID) | ORAL | Status: DC
  Administered 2023-05-06 – 2023-05-12 (×13): 20 mg via ORAL
  Filled 2023-05-06 (×13): qty 1

## 2023-05-06 NOTE — Telephone Encounter (Signed)
I spoke with Alex Church, in short I recommend to proceed with some sort of treatment, whether invasive or not as recommended by the hospital team. Advised that treatment hopefully will alleviate most of his symptoms. He is particularly interested on talking with Dr. Barbaraann Cao for another opinion.  Also spoke with [name], the patient's wife, she is worried but no suicidal.

## 2023-05-06 NOTE — Assessment & Plan Note (Signed)
Acute visit, patient has multiple issues.  Here with his sister-in-law, I have communicated with the patient's wife in the last day.  All the information today was acquired from the pt and his family  Stroke?  3 days ago had a fall, needed help getting up, reportedly his left side was weak. No diplopia, no slurred speech.  On exam today, he has weak R arm. This could have been a stroke, recommend evaluation today at the emergency room.  The patient verbalized understanding. Dementia, failure to thrive. The family reports that for the last 2 months he has mental status change, increased confusion, they provided several examples that clearly indicate increased confusion. Plan is to reassess the issues after he is seen in the emergency room. Nausea vomiting: Has been happening for 2 weeks, not every day.  Abdominal exam is benign. Again plan is to get patient evaluated at the ER. Recommend not to drive until next visit. RTC and further evaluation depending on the ER findings Addendum: CT head showed large brain tumor.  Admitted to the hospital.

## 2023-05-06 NOTE — Plan of Care (Signed)
  Problem: Education: Goal: Knowledge of General Education information will improve Description Including pain rating scale, medication(s)/side effects and non-pharmacologic comfort measures Outcome: Progressing   

## 2023-05-06 NOTE — Plan of Care (Signed)

## 2023-05-06 NOTE — Progress Notes (Signed)
Patient to CT at this time

## 2023-05-06 NOTE — Progress Notes (Signed)
PROGRESS NOTE        PATIENT DETAILS Name: Alex Church Age: 77 y.o. Sex: male Date of Birth: 08/09/1946 Admit Date: 05/05/2023 Admitting Physician Azucena Fallen, MD PCP:Paz, Nolon Rod, MD  Brief Summary: Patient is a 77 y.o.  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  Significant events: 8/27>> admitted to [facility]  Significant studies: 8/27>> MRI brain: Enhancing mass located in the right frontal lobe-mass effect on right lateral ventricle 8/27>> CT C-spine: No fracture 8/27>> CTA head/neck: Severe narrowing of the origin of right vertebral artery and V4 segment of right vertebral artery.  Significant microbiology data: 8/27>> COVID PCR: Negative  Procedures: None  Consults: Neurosurgery  Subjective: Lying comfortably in bed-denies any chest pain or shortness of breath.  Objective: Vitals: Blood pressure (!) 119/49, pulse 81, temperature 98 F (36.7 C), temperature source Oral, resp. rate 16, height 6' (1.829 m), weight 88.1 kg, SpO2 90%.   Exam: Gen Exam:Alert awake-not in any distress HEENT:atraumatic, normocephalic Chest: B/L clear to auscultation anteriorly CVS:S1S2 regular Abdomen:soft non tender, non distended Extremities:no edema Neurology: Minimal left upper extremity weakness Epitol. Skin: no rash  Pertinent Labs/Radiology:    Latest Ref Rng & Units 05/06/2023    9:02 AM 05/05/2023    1:01 PM 01/19/2023    4:01 PM  CBC  WBC 4.0 - 10.5 K/uL 9.7  9.1  9.4   Hemoglobin 13.0 - 17.0 g/dL 58.5  27.7  82.4   Hematocrit 39.0 - 52.0 % 37.2  38.6  39.2   Platelets 150 - 400 K/uL 198  202  208.0     Lab Results  Component Value Date   NA 135 05/06/2023   K 4.4 05/06/2023   CL 100 05/06/2023   CO2 22 05/06/2023      Assessment/Plan: 7 cm  right frontal brain mass with mass effect/edema Suspicion for high-grade glioma Continue Decadron-minimal to no left-sided weakness on my exam Patient contemplating options including surgical excision versus pursuing nonsurgical treatment Neurosurgical consultation appreciated Dr. Vaslow-neuro-oncology not in town today-he will try to see patient tomorrow. Await CT chest/abdomen/pelvis for metastatic workup but suspicion is this is a primary CNS tumor.  Acute metabolic encephalopathy Likely secondary to above Relatively awake and alert-Per family/patient this is a very subtle finding. TSH/B12/ammonia-stable  DM-2 (A1c 6.9 on 5/13) CBG stable on SSI  Recent Labs    05/05/23 2302 05/06/23 0841 05/06/23 1210  GLUCAP 193* 285* 193*    HTN BP stable Clonidine/Coreg/lisinopril Follow/optimize  HLD Lipitor  Hypomagnesemia Replete/recheck  BMI: Estimated body mass index is 26.34 kg/m as calculated from the following:   Height as of this encounter: 6' (1.829 m).   Weight as of this encounter: 88.1 kg.   Code status:   Code Status: Full Code   DVT Prophylaxis: SCDs Start: 05/05/23 2028   Family Communication: Spouse at bedside   Disposition Plan: Status is: Inpatient Remains inpatient appropriate because: Severity of illness   Planned Discharge Destination:Home   Diet: Diet Order             Diet regular Room service appropriate? Yes; Fluid consistency: Thin  Diet effective now  Antimicrobial agents: Anti-infectives (From admission, onward)    None        MEDICATIONS: Scheduled Meds:  atorvastatin  10 mg Oral QHS   carvedilol  12.5 mg Oral BID WC   cloNIDine  0.2 mg Oral BID   dexamethasone (DECADRON) injection  4 mg Intravenous Q6H   famotidine  20 mg Oral BID   insulin aspart  0-9 Units Subcutaneous TID WC   lisinopril  20 mg Oral BID   Continuous Infusions: PRN Meds:.acetaminophen **OR** acetaminophen, melatonin,  ondansetron (ZOFRAN) IV   I have personally reviewed following labs and imaging studies  LABORATORY DATA: CBC: Recent Labs  Lab 05/05/23 1301 05/06/23 0902  WBC 9.1 9.7  NEUTROABS 6.6 8.8*  HGB 12.5* 12.5*  HCT 38.6* 37.2*  MCV 93.0 92.5  PLT 202 198    Basic Metabolic Panel: Recent Labs  Lab 05/05/23 1301 05/06/23 0902 05/06/23 0903  NA 136 135  --   K 4.3 4.4  --   CL 101 100  --   CO2 25 22  --   GLUCOSE 148* 277*  --   BUN 18 17  --   CREATININE 1.20 1.17  --   CALCIUM 9.1 9.0  --   MG  --  1.1* 1.1*    GFR: Estimated Creatinine Clearance: 59 mL/min (by C-G formula based on SCr of 1.17 mg/dL).  Liver Function Tests: Recent Labs  Lab 05/05/23 1301 05/06/23 0902  AST 18 18  ALT 15 17  ALKPHOS 62 70  BILITOT 0.5 0.7  PROT 7.2 6.6  ALBUMIN 3.8 3.4*   No results for input(s): "LIPASE", "AMYLASE" in the last 168 hours. Recent Labs  Lab 05/06/23 0902  AMMONIA 26    Coagulation Profile: Recent Labs  Lab 05/06/23 0902  INR 1.1    Cardiac Enzymes: Recent Labs  Lab 05/06/23 0902  CKTOTAL 74    BNP (last 3 results) No results for input(s): "PROBNP" in the last 8760 hours.  Lipid Profile: No results for input(s): "CHOL", "HDL", "LDLCALC", "TRIG", "CHOLHDL", "LDLDIRECT" in the last 72 hours.  Thyroid Function Tests: Recent Labs    05/06/23 0902  TSH 0.390    Anemia Panel: Recent Labs    05/06/23 0902  VITAMINB12 1,397*    Urine analysis:    Component Value Date/Time   COLORURINE YELLOW 05/05/2023 1411   APPEARANCEUR CLEAR 05/05/2023 1411   LABSPEC 1.020 05/05/2023 1411   PHURINE 5.0 05/05/2023 1411   GLUCOSEU NEGATIVE 05/05/2023 1411   HGBUR NEGATIVE 05/05/2023 1411   BILIRUBINUR NEGATIVE 05/05/2023 1411   KETONESUR NEGATIVE 05/05/2023 1411   PROTEINUR NEGATIVE 05/05/2023 1411   NITRITE NEGATIVE 05/05/2023 1411   LEUKOCYTESUR NEGATIVE 05/05/2023 1411    Sepsis Labs: Lactic Acid, Venous No results found for:  "LATICACIDVEN"  MICROBIOLOGY: Recent Results (from the past 240 hour(s))  SARS Coronavirus 2 by RT PCR (hospital order, performed in [facility] hospital lab) *cepheid single result test* Anterior Nasal Swab     Status: None   Collection Time: 05/05/23  1:01 PM   Specimen: Anterior Nasal Swab  Result Value Ref Range Status   SARS Coronavirus 2 by RT PCR NEGATIVE NEGATIVE Final    Comment: (NOTE) SARS-CoV-2 target nucleic acids are NOT DETECTED.  The SARS-CoV-2 RNA is generally detectable in upper and lower respiratory specimens during the acute phase of infection. The lowest concentration of SARS-CoV-2 viral copies this assay can detect is 250 copies / mL. A negative result does not preclude SARS-CoV-2  infection and should not be used as the sole basis for treatment or other patient management decisions.  A negative result may occur with improper specimen collection / handling, submission of specimen other than nasopharyngeal swab, presence of viral mutation(s) within the areas targeted by this assay, and inadequate number of viral copies (<250 copies / mL). A negative result must be combined with clinical observations, patient history, and epidemiological information.  Fact Sheet for Patients:   RoadLapTop.co.za  Fact Sheet for Healthcare Providers: http://kim-miller.com/  This test is not yet approved or  cleared by the [place] FDA and has been authorized for detection and/or diagnosis of SARS-CoV-2 by FDA under an Emergency Use Authorization (EUA).  This EUA will remain in effect (meaning this test can be used) for the duration of the COVID-19 declaration under Section 564(b)(1) of the Act, 21 U.S.C. section 360bbb-3(b)(1), unless the authorization is terminated or revoked sooner.  Performed at [facility], [address] [place], [place] [address]     RADIOLOGY STUDIES/RESULTS: MR Brain W and Wo  Contrast  Result Date: 05/05/2023 CLINICAL DATA:  CNS neoplasm monitoring EXAM: MRI HEAD WITHOUT AND WITH CONTRAST TECHNIQUE: Multiplanar, multiecho pulse sequences of the brain and surrounding structures were obtained without and with intravenous contrast. CONTRAST:  9mL GADAVIST GADOBUTROL 1 MMOL/ML IV SOLN COMPARISON:  05/05/2023 CTA head neck FINDINGS: Brain: There is a large, predominantly peripherally contrast-enhancing mass located within the right frontal lobe, measuring 6.8 x 4.9 cm. There is a large amount of surrounding hyperintense T2-weighted signal. There is marked mass effect on the right lateral ventricle and leftward midline shift 6 mm. There is early subfalcine herniation of the cingulate gyrus. No acute hemorrhage. The central necrotic component of the mass does not restrict diffusion. Vascular: Major flow voids are preserved. Skull and upper cervical spine: Normal calvarium and skull base. Visualized upper cervical spine and soft tissues are normal. Sinuses/Orbits:No paranasal sinus fluid levels or advanced mucosal thickening. No mastoid or middle ear effusion. Normal orbits. IMPRESSION: 1. Large, predominantly peripherally contrast-enhancing mass located within the right frontal lobe, measuring 6.8 x 4.9 cm. Large amount of surrounding hyperintense T2-weighted signal. This is most consistent with a high-grade glioma. 2. Marked mass effect on the right lateral ventricle and 6 mm leftward midline shift. Early subfalcine herniation of the cingulate gyrus. Electronically Signed   By: Deatra Robinson M.D.   On: 05/05/2023 22:39   CT ANGIO HEAD NECK W WO CM  Result Date: 05/05/2023 CLINICAL DATA:  Fall.  Left-sided weakness. EXAM: CT CERVICAL SPINE WITHOUT CONTRAST CT ANGIOGRAPHY HEAD AND NECK WITH AND WITHOUT CONTRAST TECHNIQUE: Multidetector CT imaging of the cervical spine was performed without intravenous contrast. Multiplanar CT image reconstructions were also generated. Multidetector CT  imaging of the head and neck was performed using the standard protocol during bolus administration of intravenous contrast. Multiplanar CT image reconstructions and MIPs were obtained to evaluate the vascular anatomy. Carotid stenosis measurements (when applicable) are obtained utilizing NASCET criteria, using the distal internal carotid diameter as the denominator. RADIATION DOSE REDUCTION: This exam was performed according to the departmental dose-optimization program which includes automated exposure control, adjustment of the mA and/or kV according to patient size and/or use of iterative reconstruction technique. CONTRAST:  75mL OMNIPAQUE IOHEXOL 350 MG/ML SOLN COMPARISON:  None Available. FINDINGS: CT HEAD FINDINGS Brain: There is a large 5.2 x 4.2 x 5.2 cm peripherally contrast-enhancing mass centered in the right frontal lobe. This mass is likely centrally necrotic.  There is vasogenic edema surrounding this lesion with proximally 6 mm leftward midline shift. No hydrocephalus. No extra-axial fluid collection. No CT evidence of an acute cortical infarct. Vascular: No hyperdense vessel or unexpected calcification. Skull: Normal. Negative for fracture or focal lesion. Sinuses/Orbits: No middle ear or mastoid effusion. Paranasal sinuses are clear. Orbits are unremarkable. Other: None. Review of the MIP images confirms the above findings CT CERVICAL SPINE FINDINGS: Alignment: Grade 1 anterolisthesis of C3 on C4 and C4 on C5. Skull base and vertebrae: No acute fracture. No primary bone lesion or focal pathologic process. Assessment of the lower cervical spine is limited due to a combination of streak artifact and osteopenia. Soft tissues and spinal canal: No prevertebral fluid or swelling. No visible canal hematoma. Disc levels:  No evidence of high-grade spinal canal stenosis. Upper chest: Negative. Other: None CTA NECK FINDINGS Aortic arch: Standard branching. Imaged portion shows no evidence of aneurysm or  dissection. No significant stenosis of the major arch vessel origins. Right carotid system: No evidence of dissection, stenosis (50% or greater), or occlusion. Left carotid system: No evidence of dissection, stenosis (50% or greater), or occlusion. Vertebral arteries: Moderate to severe narrowing of the origin of the right vertebral artery. Moderate to severe narrowing in the V4 segment of the right vertebral artery. Mild narrowing of the origin of the left vertebral artery. No evidence of a dissection. Codominant. Skeleton: See above Other neck: Negative. Upper chest: Negative. Review of the MIP images confirms the above findings CTA HEAD FINDINGS Anterior circulation: No significant stenosis, proximal occlusion, aneurysm, or vascular malformation. Posterior circulation: Mild narrowing of the P1 P2 junction of the left PCA. Otherwise no proximal occlusion, aneurysm, or vascular malformation. Venous sinuses: As permitted by contrast timing, patent. Anatomic variants: None Review of the MIP images confirms the above findings IMPRESSION: 1. Large 5.2 x 4.2 x 5.2 cm peripherally contrast-enhancing mass centered in the right frontal lobe with surrounding vasogenic edema and approximately 6 mm leftward midline shift. This could either represent a solitary metastatic lesion or a high grade glial neoplasm. Recommend brain MRI with and without contrast for further evaluation. 2. No acute fracture or traumatic listhesis of the cervical spine. 3. Moderate to severe narrowing of the origin of the right vertebral artery and V4 segment of the right vertebral artery. Electronically Signed   By: Lorenza Cambridge M.D.   On: 05/05/2023 16:26   CT C-SPINE NO CHARGE  Result Date: 05/05/2023 CLINICAL DATA:  Fall.  Left-sided weakness. EXAM: CT CERVICAL SPINE WITHOUT CONTRAST CT ANGIOGRAPHY HEAD AND NECK WITH AND WITHOUT CONTRAST TECHNIQUE: Multidetector CT imaging of the cervical spine was performed without intravenous contrast.  Multiplanar CT image reconstructions were also generated. Multidetector CT imaging of the head and neck was performed using the standard protocol during bolus administration of intravenous contrast. Multiplanar CT image reconstructions and MIPs were obtained to evaluate the vascular anatomy. Carotid stenosis measurements (when applicable) are obtained utilizing NASCET criteria, using the distal internal carotid diameter as the denominator. RADIATION DOSE REDUCTION: This exam was performed according to the departmental dose-optimization program which includes automated exposure control, adjustment of the mA and/or kV according to patient size and/or use of iterative reconstruction technique. CONTRAST:  75mL OMNIPAQUE IOHEXOL 350 MG/ML SOLN COMPARISON:  None Available. FINDINGS: CT HEAD FINDINGS Brain: There is a large 5.2 x 4.2 x 5.2 cm peripherally contrast-enhancing mass centered in the right frontal lobe. This mass is likely centrally necrotic. There is vasogenic edema surrounding this lesion  with proximally 6 mm leftward midline shift. No hydrocephalus. No extra-axial fluid collection. No CT evidence of an acute cortical infarct. Vascular: No hyperdense vessel or unexpected calcification. Skull: Normal. Negative for fracture or focal lesion. Sinuses/Orbits: No middle ear or mastoid effusion. Paranasal sinuses are clear. Orbits are unremarkable. Other: None. Review of the MIP images confirms the above findings CT CERVICAL SPINE FINDINGS: Alignment: Grade 1 anterolisthesis of C3 on C4 and C4 on C5. Skull base and vertebrae: No acute fracture. No primary bone lesion or focal pathologic process. Assessment of the lower cervical spine is limited due to a combination of streak artifact and osteopenia. Soft tissues and spinal canal: No prevertebral fluid or swelling. No visible canal hematoma. Disc levels:  No evidence of high-grade spinal canal stenosis. Upper chest: Negative. Other: None CTA NECK FINDINGS Aortic arch:  Standard branching. Imaged portion shows no evidence of aneurysm or dissection. No significant stenosis of the major arch vessel origins. Right carotid system: No evidence of dissection, stenosis (50% or greater), or occlusion. Left carotid system: No evidence of dissection, stenosis (50% or greater), or occlusion. Vertebral arteries: Moderate to severe narrowing of the origin of the right vertebral artery. Moderate to severe narrowing in the V4 segment of the right vertebral artery. Mild narrowing of the origin of the left vertebral artery. No evidence of a dissection. Codominant. Skeleton: See above Other neck: Negative. Upper chest: Negative. Review of the MIP images confirms the above findings CTA HEAD FINDINGS Anterior circulation: No significant stenosis, proximal occlusion, aneurysm, or vascular malformation. Posterior circulation: Mild narrowing of the P1 P2 junction of the left PCA. Otherwise no proximal occlusion, aneurysm, or vascular malformation. Venous sinuses: As permitted by contrast timing, patent. Anatomic variants: None Review of the MIP images confirms the above findings IMPRESSION: 1. Large 5.2 x 4.2 x 5.2 cm peripherally contrast-enhancing mass centered in the right frontal lobe with surrounding vasogenic edema and approximately 6 mm leftward midline shift. This could either represent a solitary metastatic lesion or a high grade glial neoplasm. Recommend brain MRI with and without contrast for further evaluation. 2. No acute fracture or traumatic listhesis of the cervical spine. 3. Moderate to severe narrowing of the origin of the right vertebral artery and V4 segment of the right vertebral artery. Electronically Signed   By: Lorenza Cambridge M.D.   On: 05/05/2023 16:26   DG Chest Portable 1 View  Result Date: 05/05/2023 CLINICAL DATA:  Chest pain EXAM: PORTABLE CHEST 1 VIEW COMPARISON:  None Available. FINDINGS: Slight linear opacity left lung base likely scar or atelectasis. No  consolidation, pneumothorax or effusion. No edema. Calcified aorta. Overlapping cardiac leads. IMPRESSION: Minimal left basilar atelectasis or scar. No acute cardiopulmonary disease Electronically Signed   By: Karen Kays M.D.   On: 05/05/2023 14:56     LOS: 1 day   Jeoffrey Massed, MD  [facility]    To contact the attending provider between 7A-7P or the covering provider during after hours 7P-7A, please log into the web site [email or web address] and access using universal [place] password for that web site. If you do not have the password, please call the hospital operator.  05/06/2023, 1:32 PM

## 2023-05-06 NOTE — Consult Note (Addendum)
   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  He complains of a few days of left-sided weakness and went to the emergency department in [facility] and was found to have a large right frontal lesion and therefore was transferred here for evaluation.  His wife is at bedside and also complains of some altered mental status intermittently since May.  He denies any history of cancers or surgeries.  He has a history of diabetes and high blood pressure.  He denies any seizure activity.  He is fully independent at home, lives with his wife, has no children.  Medications: I have reviewed the patient's current medications. Allergies: No Known Allergies  History reviewed. No pertinent family history. Social History:  has no history on file for tobacco use, alcohol use, and drug use.  ROS: All pertinent positive negatives are listed in HPI above  Physical Exam:  Vital signs in last 24 hours: Temp:  [98 F (36.7 C)-98.3 F (36.8 C)] 98 F (36.7 C) (07/25 1814) Pulse Rate:  [58-128] 65 (07/26 0746) Resp:  [11-18] 14 (07/26 0217) BP: (138-182)/(65-125) 153/88 (07/26 0700) SpO2:  [91 %-98 %] 96 % (07/26 0746) PE: PERRLA Awake alert oriented x 3 Speech fluent appropriate Cranial nerves II through XII intact Right upper/right lower extremity full strength throughout Left upper/left lower extremity 4+/5 throughout Positive left pronator drift SILT Nonlabored breathing    Imaging: CT brain and MRI brain reviewed.  There is a large 7 cm right frontal rim-enhancing lesion concerning for high-grade glioma with local mass effect and mild midline shift.  There is peritumoral edema.  There is no hydrocephalus or acute stroke.    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  I recommended surgical intervention for tumor diagnosis and cytoreductive surgery.  I also offered them a needle biopsy as the patient is apprehensive about any type of surgical intervention.  We extensively went over risks, benefits and expected outcomes, as well as expected treatment if this is a high-grade glioma.  He is going to discuss with his wife and is unsure of his decision at this time. -Continue Decadron  Thank you for allowing me to participate in this patient's care.  Please do not hesitate to call with questions or concerns.   Monia Pouch, DO Neurosurgeon [facility] [phone]

## 2023-05-07 DIAGNOSIS — E782 Mixed hyperlipidemia: Secondary | ICD-10-CM | POA: Diagnosis not present

## 2023-05-07 DIAGNOSIS — D499 Neoplasm of unspecified behavior of unspecified site: Secondary | ICD-10-CM | POA: Diagnosis not present

## 2023-05-07 DIAGNOSIS — E119 Type 2 diabetes mellitus without complications: Secondary | ICD-10-CM | POA: Diagnosis not present

## 2023-05-07 DIAGNOSIS — C711 Malignant neoplasm of frontal lobe: Secondary | ICD-10-CM

## 2023-05-07 DIAGNOSIS — I10 Essential (primary) hypertension: Secondary | ICD-10-CM | POA: Diagnosis not present

## 2023-05-07 LAB — GLUCOSE, CAPILLARY
Glucose-Capillary: 249 mg/dL — ABNORMAL HIGH (ref 70–99)
Glucose-Capillary: 271 mg/dL — ABNORMAL HIGH (ref 70–99)
Glucose-Capillary: 258 mg/dL — ABNORMAL HIGH (ref 70–99)
Glucose-Capillary: 287 mg/dL — ABNORMAL HIGH (ref 70–99)

## 2023-05-07 LAB — BASIC METABOLIC PANEL
Anion gap: 8 (ref 5–15)
BUN: 19 mg/dL (ref 8–23)
CO2: 23 mmol/L (ref 22–32)
Calcium: 8.6 mg/dL — ABNORMAL LOW (ref 8.9–10.3)
Chloride: 102 mmol/L (ref 98–111)
Creatinine, Ser: 1.37 mg/dL — ABNORMAL HIGH (ref 0.61–1.24)
GFR, Estimated: 53 mL/min — ABNORMAL LOW (ref >60)
Glucose, Bld: 224 mg/dL — ABNORMAL HIGH (ref 70–99)
Potassium: 4.1 mmol/L (ref 3.5–5.1)
Sodium: 133 mmol/L — ABNORMAL LOW (ref 135–145)

## 2023-05-07 LAB — MAGNESIUM: Magnesium: 1.8 mg/dL (ref 1.7–2.4)

## 2023-05-07 MED ORDER — ALUM & MAG HYDROXIDE-SIMETH 200-200-20 MG/5ML PO SUSP
15.0000 mL | ORAL | Status: DC | PRN
  Administered 2023-05-07: 15 mL via ORAL
  Filled 2023-05-07: qty 30

## 2023-05-07 MED ORDER — INSULIN ASPART 100 UNIT/ML IJ SOLN
0.0000 [IU] | Freq: Three times a day (TID) | INTRAMUSCULAR | Status: DC
  Administered 2023-05-07: 11 [IU] via SUBCUTANEOUS
  Administered 2023-05-08: 15 [IU] via SUBCUTANEOUS
  Administered 2023-05-08 – 2023-05-09 (×3): 7 [IU] via SUBCUTANEOUS
  Administered 2023-05-09: 4 [IU] via SUBCUTANEOUS
  Administered 2023-05-09: 15 [IU] via SUBCUTANEOUS

## 2023-05-07 NOTE — Inpatient Diabetes Management (Signed)
Inpatient Diabetes Program Recommendations  AACE/ADA: New Consensus Statement on Inpatient Glycemic Control (2015)  Target Ranges:  Prepandial:   less than 140 mg/dL      Peak postprandial:   less than 180 mg/dL (1-2 hours)      Critically ill patients:  140 - 180 mg/dL   Lab Results  Component Value Date   GLUCAP 271 (H) 05/07/2023   HGBA1C 6.9 (H) 05/06/2023    Review of Glycemic Control  Diabetes history: type 2 Outpatient Diabetes medications: Januvia 100 mg daily, Metformin 1000 mg BID, Actos 30 mg daily Current orders for Inpatient glycemic control: Novolog 0-9 units correction scale TID  Inpatient Diabetes Program Recommendations:   Noted that blood sugars have been greater than 180 mg/dl.   Recommend adding Semglee 10 units daily and continue Novolog 0-9 units correction scale,changing to every 4 hours if not eating well. Will continue to monitor blood sugars while in the hospital.  Smith Mince RN BSN CDE Diabetes Coordinator Pager: [phone]  8am-5pm

## 2023-05-07 NOTE — Progress Notes (Signed)
   05/07/23 1120  Spiritual Encounters  Type of Visit Initial  Care provided to: Pt and family  Conversation partners present during encounter Nurse  Referral source Nurse (RN/NT/LPN)  Reason for visit Urgent spiritual support  OnCall Visit No   Reason For Visit:    Chaplain responding to Spiritual Consult informing us that Pt needs support as he has undergone a major life tranisiton.  Interventions:     Cultivated a relationship of care and support; Explored relational needs and resources; Explored spiritual needs and resources Practiced compassionate presence and empathetic listening; supported decision-making resources and discussed alternatives and their outcomes   Outcomes:     Pt utilized emotional resources and tearfully processed feelings (w/wife) Pt and wise progressed toward new normal and purpose in this season Pt expresses appreciation for time with chaplain  Assessment:      Needs- Pt has difficulty believing and accepting the existence of a higher power and this lack of faith concerns his wife Pt wrestling with fear of the surgical option for treating his tumor. Fear appears to be deeper than what one might normally anticipate feeling for a surgery of this magnitude. Resources Pt and wife express deep family support and wide circle of caring friends Pt and wife appear to have strong and loving connection  Description Pt is a very kind and mild-mannered man facing difficult choices regarding his health.  All his options seem like difficult and scary options to him right now.  He seems to this chaplain to be resigned to a declining quality of life from this point forward.  I don't know whether this is congruent with the actual medical diagnosis or not.  He is not what I would call melancholy, but he doesn't appear to be up for a big fight.  Plan:     It is unclear what option the Pt and his wife will chose moving forward. Surgery will keep him here longer and in that case  Spiritual Care will continue to follow up with him on a regular basis.     Chaplain services remain available by Spiritual Consult or for emergent cases, paging [phone]  Chaplain Raelene Bott, MDiv [email or web address] [phone]

## 2023-05-07 NOTE — Plan of Care (Signed)

## 2023-05-07 NOTE — Plan of Care (Signed)

## 2023-05-07 NOTE — Consult Note (Signed)
[facility] Neuro-Oncology Consult Note  Patient Care Team: Wanda Plump, MD as PCP - General (Internal Medicine) Mateo Flow, MD as Consulting Physician (Ophthalmology) Tarri Glenn, Johnathan Hausen, PA-C as Physician Assistant (Rheumatology)  CHIEF COMPLAINTS/PURPOSE OF CONSULTATION:  Right Frontal Mass  HISTORY OF PRESENTING ILLNESS:  Alex Church 77 y.o. male presented with several days history of left sided weakness, confusion.  Wife noticed he had difficulty getting out of bed, was dragging left side.  This is on top of several months history of progressive confusion, short term memory impairment.  At this time he feels some degree of improvement with steroids.  He does endorse some balance issues but no frank weakness.   Denies seizures, headaches.  MEDICAL HISTORY:  Past Medical History:  Diagnosis Date   B12 deficiency    monthly shots   Diabetes mellitus    Glaucoma suspect    Hyperlipemia    Hypertension     SURGICAL HISTORY: Past Surgical History:  Procedure Laterality Date   NO PAST SURGERIES      SOCIAL HISTORY: Social History   Socioeconomic History   Marital status: Married    Spouse name: Not on file   Number of children: 0   Years of education: Not on file   Highest education level: Not on file  Occupational History   Occupation: retired from [employer], works for a lab driving  Tobacco Use   Smoking status: Never   Smokeless tobacco: Never  Substance and Sexual Activity   Alcohol use: No   Drug use: No   Sexual activity: Not on file  Other Topics Concern   Not on file  Social History Narrative   Married (2nd marriage)   Lives w/ wife     Has a rescue dog   Social Determinants of Health   Financial Resource Strain: Low Risk  (10/21/2022)   Overall Financial Resource Strain (CARDIA)    Difficulty of Paying Living Expenses: Not hard at all  Food Insecurity: No Food Insecurity (05/06/2023)   Hunger Vital Sign    Worried About Running  Out of Food in the Last Year: Never true    Ran Out of Food in the Last Year: Never true  Transportation Needs: No Transportation Needs (05/06/2023)   PRAPARE - Administrator, Civil Service (Medical): No    Lack of Transportation (Non-Medical): No  Physical Activity: Sufficiently Active (10/10/2021)   Exercise Vital Sign    Days of Exercise per Week: 7 days    Minutes of Exercise per Session: 30 min  Stress: No Stress Concern Present (10/21/2022)   [employer] of Occupational Health - Occupational Stress Questionnaire    Feeling of Stress : Not at all  Social Connections: Moderately Isolated (10/10/2021)   Social Connection and Isolation Panel [NHANES]    Frequency of Communication with Friends and Family: More than three times a week    Frequency of Social Gatherings with Friends and Family: More than three times a week    Attends Religious Services: Never    Database administrator or Organizations: No    Attends Banker Meetings: Never    Marital Status: Married  Catering manager Violence: Not At Risk (05/06/2023)   Humiliation, Afraid, Rape, and Kick questionnaire    Fear of Current or Ex-Partner: No    Emotionally Abused: No    Physically Abused: No    Sexually Abused: No    FAMILY HISTORY: Family History  Problem Relation Age of Onset   Diabetes Maternal Uncle    Heart attack Brother 27   Cancer Mother        intra-abdominal   Cirrhosis Cousin        maternal   Diabetes Cousin        maternal   Stroke Neg Hx    Colon cancer Neg Hx    Prostate cancer Neg Hx     ALLERGIES:  has No Known Allergies.  MEDICATIONS:  Current Facility-Administered Medications  Medication Dose Route Frequency Provider Last Rate Last Admin   acetaminophen (TYLENOL) tablet 650 mg  650 mg Oral Q6H PRN Howerter, Justin B, DO       Or   acetaminophen (TYLENOL) suppository 650 mg  650 mg Rectal Q6H PRN Howerter, Justin B, DO       alum & mag hydroxide-simeth  (MAALOX/MYLANTA) 200-200-20 MG/5ML suspension 15 mL  15 mL Oral Q4H PRN Maretta Bees, MD   15 mL at 05/07/23 1409   atorvastatin (LIPITOR) tablet 10 mg  10 mg Oral QHS Howerter, Justin B, DO   10 mg at 05/06/23 2130   carvedilol (COREG) tablet 12.5 mg  12.5 mg Oral BID WC Howerter, Justin B, DO   12.5 mg at 05/07/23 0843   cloNIDine (CATAPRES) tablet 0.2 mg  0.2 mg Oral BID Howerter, Justin B, DO   0.2 mg at 05/07/23 0843   dexamethasone (DECADRON) injection 4 mg  4 mg Intravenous Q6H Howerter, Justin B, DO   4 mg at 05/07/23 1157   famotidine (PEPCID) tablet 20 mg  20 mg Oral BID Howerter, Justin B, DO   20 mg at 05/07/23 0843   insulin aspart (novoLOG) injection 0-20 Units  0-20 Units Subcutaneous TID WC Ghimire, Werner Lean, MD       lisinopril (ZESTRIL) tablet 20 mg  20 mg Oral BID Howerter, Justin B, DO   20 mg at 05/07/23 0843   melatonin tablet 3 mg  3 mg Oral QHS PRN Howerter, Justin B, DO   3 mg at 05/06/23 2358   ondansetron (ZOFRAN) injection 4 mg  4 mg Intravenous Q6H PRN Howerter, Justin B, DO        REVIEW OF SYSTEMS:   Constitutional: Denies fevers, chills or abnormal weight loss Eyes: Denies blurriness of vision Ears, nose, mouth, throat, and face: Denies mucositis or sore throat Respiratory: Denies cough, dyspnea or wheezes Cardiovascular: Denies palpitation, chest discomfort or lower extremity swelling Gastrointestinal:  Denies nausea, constipation, diarrhea GU: Denies dysuria or incontinence Skin: Denies abnormal skin rashes Neurological: Per HPI Musculoskeletal: Denies joint pain, back or neck discomfort. No decrease in ROM Behavioral/Psych: Denies anxiety, disturbance in thought content, and mood instability   PHYSICAL EXAMINATION: Vitals:   05/07/23 0843 05/07/23 1149  BP: (!) 136/57 (!) 106/55  Pulse:    Resp:    Temp: (!) 97.5 F (36.4 C) 98 F (36.7 C)  SpO2:     KPS: 80. General: Alert, cooperative, pleasant, in no acute distress Head:  Normal EENT: No conjunctival injection or scleral icterus. Oral mucosa moist Lungs: Resp effort normal Cardiac: Regular rate and rhythm Abdomen: Soft, non-distended abdomen Skin: No rashes cyanosis or petechiae. Extremities: No clubbing or edema  NEUROLOGIC EXAM: Mental Status: Awake, alert, attentive to examiner. Oriented to self and environment. Language is fluent with intact comprehension.  Left side neglect and agnosia noted. Cranial Nerves: Visual acuity is grossly normal. Visual fields are full. Extra-ocular movements intact. No ptosis. Face  is symmetric, tongue midline. Motor: Tone and bulk are normal. Power is full in both arms and legs. Reflexes are symmetric, no pathologic reflexes present. Intact finger to nose bilaterally Sensory: Intact to light touch and temperature Gait: Deferred   LABORATORY DATA:  I have reviewed the data as listed Lab Results  Component Value Date   WBC 9.7 05/06/2023   HGB 12.5 (L) 05/06/2023   HCT 37.2 (L) 05/06/2023   MCV 92.5 05/06/2023   PLT 198 05/06/2023   Recent Labs    08/26/22 1506 01/19/23 1601 05/05/23 1301 05/06/23 0902 05/07/23 0348  NA 137   < > 136 135 133*  K 4.5   < > 4.3 4.4 4.1  CL 100   < > 101 100 102  CO2 28   < > 25 22 23   GLUCOSE 114*   < > 148* 277* 224*  BUN 20   < > 18 17 19   CREATININE 1.15   < > 1.20 1.17 1.37*  CALCIUM 9.5   < > 9.1 9.0 8.6*  GFRNONAA  --   --  >60 >60 53*  PROT 7.0  --  7.2 6.6  --   ALBUMIN 4.1  --  3.8 3.4*  --   AST 12  --  18 18  --   ALT 8  --  15 17  --   ALKPHOS 77  --  62 70  --   BILITOT 0.5  --  0.5 0.7  --    < > = values in this interval not displayed.    RADIOGRAPHIC STUDIES: I have personally reviewed the radiological images as listed and agreed with the findings in the report. CT CHEST ABDOMEN PELVIS W CONTRAST  Result Date: 05/06/2023 CLINICAL DATA:  Brain malignancy, primary search * Tracking Code: BO * EXAM: CT CHEST, ABDOMEN, AND PELVIS WITH CONTRAST TECHNIQUE:  Multidetector CT imaging of the chest, abdomen and pelvis was performed following the standard protocol during bolus administration of intravenous contrast. RADIATION DOSE REDUCTION: This exam was performed according to the departmental dose-optimization program which includes automated exposure control, adjustment of the mA and/or kV according to patient size and/or use of iterative reconstruction technique. CONTRAST:  75mL OMNIPAQUE IOHEXOL 350 MG/ML SOLN COMPARISON:  None Available. FINDINGS: CT CHEST FINDINGS Cardiovascular: Aortic atherosclerosis. Normal heart size. Extensive three-vessel coronary artery calcifications and stents. No pericardial effusion. Mediastinum/Nodes: No enlarged mediastinal, hilar, or axillary lymph nodes. Thyroid gland, trachea, and esophagus demonstrate no significant findings. Lungs/Pleura: Bandlike scarring of the bilateral lung bases. No pleural effusion or pneumothorax. Musculoskeletal: No chest wall abnormality. No acute osseous findings. CT ABDOMEN PELVIS FINDINGS Hepatobiliary: No solid liver abnormality is seen. Calcified sludge and gallstones in the gallbladder. No gallbladder wall thickening, or biliary dilatation. Pancreas: Unremarkable. No pancreatic ductal dilatation or surrounding inflammatory changes. Spleen: Normal in size without significant abnormality. Benign cyst of the superior spleen, requiring no further follow-up or characterization. Adrenals/Urinary Tract: Adrenal glands are unremarkable. Nonobstructive calculi of the inferior pole of the left kidney. No right-sided calculi, ureteral calculi bladder is unremarkable. Stomach/Bowel: Stomach is within normal limits. Appendix appears normal. No evidence of bowel wall thickening, distention, or inflammatory changes. Descending and sigmoid diverticulosis. Vascular/Lymphatic: Aortic atherosclerosis. No enlarged abdominal or pelvic lymph nodes. Reproductive: Prostatomegaly Other: No abdominal wall hernia or  abnormality. No ascites. Musculoskeletal: No acute osseous findings. IMPRESSION: 1. No evidence of primary neoplasm or disease in the chest, abdomen, or pelvis. 2. Cholelithiasis 3. Nonobstructive left nephrolithiasis. 4.  Descending and sigmoid diverticulosis without evidence of acute diverticulitis. 5. Prostatomegaly. 6. Coronary artery disease. Aortic Atherosclerosis (ICD10-I70.0). Electronically Signed   By: Jearld Lesch M.D.   On: 05/06/2023 16:37   MR Brain W and Wo Contrast  Result Date: 05/05/2023 CLINICAL DATA:  CNS neoplasm monitoring EXAM: MRI HEAD WITHOUT AND WITH CONTRAST TECHNIQUE: Multiplanar, multiecho pulse sequences of the brain and surrounding structures were obtained without and with intravenous contrast. CONTRAST:  9mL GADAVIST GADOBUTROL 1 MMOL/ML IV SOLN COMPARISON:  05/05/2023 CTA head neck FINDINGS: Brain: There is a large, predominantly peripherally contrast-enhancing mass located within the right frontal lobe, measuring 6.8 x 4.9 cm. There is a large amount of surrounding hyperintense T2-weighted signal. There is marked mass effect on the right lateral ventricle and leftward midline shift 6 mm. There is early subfalcine herniation of the cingulate gyrus. No acute hemorrhage. The central necrotic component of the mass does not restrict diffusion. Vascular: Major flow voids are preserved. Skull and upper cervical spine: Normal calvarium and skull base. Visualized upper cervical spine and soft tissues are normal. Sinuses/Orbits:No paranasal sinus fluid levels or advanced mucosal thickening. No mastoid or middle ear effusion. Normal orbits. IMPRESSION: 1. Large, predominantly peripherally contrast-enhancing mass located within the right frontal lobe, measuring 6.8 x 4.9 cm. Large amount of surrounding hyperintense T2-weighted signal. This is most consistent with a high-grade glioma. 2. Marked mass effect on the right lateral ventricle and 6 mm leftward midline shift. Early subfalcine  herniation of the cingulate gyrus. Electronically Signed   By: Deatra Robinson M.D.   On: 05/05/2023 22:39   CT ANGIO HEAD NECK W WO CM  Result Date: 05/05/2023 CLINICAL DATA:  Fall.  Left-sided weakness. EXAM: CT CERVICAL SPINE WITHOUT CONTRAST CT ANGIOGRAPHY HEAD AND NECK WITH AND WITHOUT CONTRAST TECHNIQUE: Multidetector CT imaging of the cervical spine was performed without intravenous contrast. Multiplanar CT image reconstructions were also generated. Multidetector CT imaging of the head and neck was performed using the standard protocol during bolus administration of intravenous contrast. Multiplanar CT image reconstructions and MIPs were obtained to evaluate the vascular anatomy. Carotid stenosis measurements (when applicable) are obtained utilizing NASCET criteria, using the distal internal carotid diameter as the denominator. RADIATION DOSE REDUCTION: This exam was performed according to the departmental dose-optimization program which includes automated exposure control, adjustment of the mA and/or kV according to patient size and/or use of iterative reconstruction technique. CONTRAST:  75mL OMNIPAQUE IOHEXOL 350 MG/ML SOLN COMPARISON:  None Available. FINDINGS: CT HEAD FINDINGS Brain: There is a large 5.2 x 4.2 x 5.2 cm peripherally contrast-enhancing mass centered in the right frontal lobe. This mass is likely centrally necrotic. There is vasogenic edema surrounding this lesion with proximally 6 mm leftward midline shift. No hydrocephalus. No extra-axial fluid collection. No CT evidence of an acute cortical infarct. Vascular: No hyperdense vessel or unexpected calcification. Skull: Normal. Negative for fracture or focal lesion. Sinuses/Orbits: No middle ear or mastoid effusion. Paranasal sinuses are clear. Orbits are unremarkable. Other: None. Review of the MIP images confirms the above findings CT CERVICAL SPINE FINDINGS: Alignment: Grade 1 anterolisthesis of C3 on C4 and C4 on C5. Skull base and  vertebrae: No acute fracture. No primary bone lesion or focal pathologic process. Assessment of the lower cervical spine is limited due to a combination of streak artifact and osteopenia. Soft tissues and spinal canal: No prevertebral fluid or swelling. No visible canal hematoma. Disc levels:  No evidence of high-grade spinal canal stenosis. Upper chest: Negative. Other:  None CTA NECK FINDINGS Aortic arch: Standard branching. Imaged portion shows no evidence of aneurysm or dissection. No significant stenosis of the major arch vessel origins. Right carotid system: No evidence of dissection, stenosis (50% or greater), or occlusion. Left carotid system: No evidence of dissection, stenosis (50% or greater), or occlusion. Vertebral arteries: Moderate to severe narrowing of the origin of the right vertebral artery. Moderate to severe narrowing in the V4 segment of the right vertebral artery. Mild narrowing of the origin of the left vertebral artery. No evidence of a dissection. Codominant. Skeleton: See above Other neck: Negative. Upper chest: Negative. Review of the MIP images confirms the above findings CTA HEAD FINDINGS Anterior circulation: No significant stenosis, proximal occlusion, aneurysm, or vascular malformation. Posterior circulation: Mild narrowing of the P1 P2 junction of the left PCA. Otherwise no proximal occlusion, aneurysm, or vascular malformation. Venous sinuses: As permitted by contrast timing, patent. Anatomic variants: None Review of the MIP images confirms the above findings IMPRESSION: 1. Large 5.2 x 4.2 x 5.2 cm peripherally contrast-enhancing mass centered in the right frontal lobe with surrounding vasogenic edema and approximately 6 mm leftward midline shift. This could either represent a solitary metastatic lesion or a high grade glial neoplasm. Recommend brain MRI with and without contrast for further evaluation. 2. No acute fracture or traumatic listhesis of the cervical spine. 3. Moderate  to severe narrowing of the origin of the right vertebral artery and V4 segment of the right vertebral artery. Electronically Signed   By: Lorenza Cambridge M.D.   On: 05/05/2023 16:26   CT C-SPINE NO CHARGE  Result Date: 05/05/2023 CLINICAL DATA:  Fall.  Left-sided weakness. EXAM: CT CERVICAL SPINE WITHOUT CONTRAST CT ANGIOGRAPHY HEAD AND NECK WITH AND WITHOUT CONTRAST TECHNIQUE: Multidetector CT imaging of the cervical spine was performed without intravenous contrast. Multiplanar CT image reconstructions were also generated. Multidetector CT imaging of the head and neck was performed using the standard protocol during bolus administration of intravenous contrast. Multiplanar CT image reconstructions and MIPs were obtained to evaluate the vascular anatomy. Carotid stenosis measurements (when applicable) are obtained utilizing NASCET criteria, using the distal internal carotid diameter as the denominator. RADIATION DOSE REDUCTION: This exam was performed according to the departmental dose-optimization program which includes automated exposure control, adjustment of the mA and/or kV according to patient size and/or use of iterative reconstruction technique. CONTRAST:  75mL OMNIPAQUE IOHEXOL 350 MG/ML SOLN COMPARISON:  None Available. FINDINGS: CT HEAD FINDINGS Brain: There is a large 5.2 x 4.2 x 5.2 cm peripherally contrast-enhancing mass centered in the right frontal lobe. This mass is likely centrally necrotic. There is vasogenic edema surrounding this lesion with proximally 6 mm leftward midline shift. No hydrocephalus. No extra-axial fluid collection. No CT evidence of an acute cortical infarct. Vascular: No hyperdense vessel or unexpected calcification. Skull: Normal. Negative for fracture or focal lesion. Sinuses/Orbits: No middle ear or mastoid effusion. Paranasal sinuses are clear. Orbits are unremarkable. Other: None. Review of the MIP images confirms the above findings CT CERVICAL SPINE FINDINGS: Alignment:  Grade 1 anterolisthesis of C3 on C4 and C4 on C5. Skull base and vertebrae: No acute fracture. No primary bone lesion or focal pathologic process. Assessment of the lower cervical spine is limited due to a combination of streak artifact and osteopenia. Soft tissues and spinal canal: No prevertebral fluid or swelling. No visible canal hematoma. Disc levels:  No evidence of high-grade spinal canal stenosis. Upper chest: Negative. Other: None CTA NECK FINDINGS Aortic arch: Standard  branching. Imaged portion shows no evidence of aneurysm or dissection. No significant stenosis of the major arch vessel origins. Right carotid system: No evidence of dissection, stenosis (50% or greater), or occlusion. Left carotid system: No evidence of dissection, stenosis (50% or greater), or occlusion. Vertebral arteries: Moderate to severe narrowing of the origin of the right vertebral artery. Moderate to severe narrowing in the V4 segment of the right vertebral artery. Mild narrowing of the origin of the left vertebral artery. No evidence of a dissection. Codominant. Skeleton: See above Other neck: Negative. Upper chest: Negative. Review of the MIP images confirms the above findings CTA HEAD FINDINGS Anterior circulation: No significant stenosis, proximal occlusion, aneurysm, or vascular malformation. Posterior circulation: Mild narrowing of the P1 P2 junction of the left PCA. Otherwise no proximal occlusion, aneurysm, or vascular malformation. Venous sinuses: As permitted by contrast timing, patent. Anatomic variants: None Review of the MIP images confirms the above findings IMPRESSION: 1. Large 5.2 x 4.2 x 5.2 cm peripherally contrast-enhancing mass centered in the right frontal lobe with surrounding vasogenic edema and approximately 6 mm leftward midline shift. This could either represent a solitary metastatic lesion or a high grade glial neoplasm. Recommend brain MRI with and without contrast for further evaluation. 2. No acute  fracture or traumatic listhesis of the cervical spine. 3. Moderate to severe narrowing of the origin of the right vertebral artery and V4 segment of the right vertebral artery. Electronically Signed   By: Lorenza Cambridge M.D.   On: 05/05/2023 16:26   DG Chest Portable 1 View  Result Date: 05/05/2023 CLINICAL DATA:  Chest pain EXAM: PORTABLE CHEST 1 VIEW COMPARISON:  None Available. FINDINGS: Slight linear opacity left lung base likely scar or atelectasis. No consolidation, pneumothorax or effusion. No edema. Calcified aorta. Overlapping cardiac leads. IMPRESSION: Minimal left basilar atelectasis or scar. No acute cardiopulmonary disease Electronically Signed   By: Karen Kays M.D.   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    We reviewed potential diagnoses, prognoses and treatment pathways in conjunction with goals of care.    We strongly recommended craniotomy or debulking craniotomy given symptom burden, tumor location.  Would be risk to proceed with radiation without surgery given size of tumor and risk of radio-inflammatory process.  Patient is understanding of this.  Ok to continue decadron for now.  Case will be discussed further with neurosurgery.   We will continue to follow along with this case.   All questions were answered. The patient knows to call the clinic with any problems, questions or concerns.  The total time spent in the encounter was 60 minutes and more than 50% was on counseling and review of test results     Henreitta Leber, MD 05/07/2023 4:27 PM

## 2023-05-07 NOTE — Care Management Important Message (Signed)
Important Message  Patient Details  Name: RONDARIUS TEITEL MRN: [id number] Date of Birth: 09-08-1946   Medicare Important Message Given:  Yes     Carma Dwiggins 05/07/2023, 3:20 PM

## 2023-05-07 NOTE — Progress Notes (Signed)
PROGRESS NOTE        PATIENT DETAILS Name: Alex Church Age: 77 y.o. Sex: male Date of Birth: 1946-08-28 Admit Date: 05/05/2023 Admitting Physician Azucena Fallen, MD PCP:Paz, Nolon Rod, MD  Brief Summary: Patient is a 77 y.o.  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  Significant events: 8/27>> admitted to [facility]  Significant studies: 8/27>> MRI brain: Enhancing mass located in the right frontal lobe-mass effect on right lateral ventricle 8/27>> CT C-spine: No fracture 8/27>> CTA head/neck: Severe narrowing of the origin of right vertebral artery and V4 segment of right vertebral artery. 8/28>> CT abdomen/pelvis: No evidence of primary neoplasm in chest/abdomen/pelvis.  Significant microbiology data: 8/27>> COVID PCR: Negative  Procedures: None  Consults: Neurosurgery  Subjective: No complaints-still not committed about surgery-wants to talk with Dr. Barbaraann Cao  Objective: Vitals: Blood pressure (!) 106/55, pulse 65, temperature 98 F (36.7 C), resp. rate 18, height 6' (1.829 m), weight 88.1 kg, SpO2 94%.   Exam: Gen Exam:Alert awake-not in any distress HEENT:atraumatic, normocephalic Chest: B/L clear to auscultation anteriorly CVS:S1S2 regular Abdomen:soft non tender, non distended Extremities:no edema Neurology: Essentially nonfocal-unchanged from yesterday. Skin: no rash  Pertinent Labs/Radiology:    Latest Ref Rng & Units 05/06/2023    9:02 AM 05/05/2023    1:01 PM 01/19/2023    4:01 PM  CBC  WBC 4.0 - 10.5 K/uL 9.7  9.1  9.4   Hemoglobin 13.0 - 17.0 g/dL 87.5  64.3  32.9   Hematocrit 39.0 - 52.0 % 37.2  38.6  39.2   Platelets 150 - 400 K/uL 198  202  208.0     Lab Results  Component Value Date   NA 133 (L) 05/07/2023   K 4.1 05/07/2023   CL  102 05/07/2023   CO2 23 05/07/2023      Assessment/Plan: 7 cm right frontal brain mass with mass effect/edema Suspicion for high-grade glioma Continue Decadron Patient contemplating various options including craniotomy/excision of mass versus radiation versus hospice/palliative care Appreciate neurosurgical input Dr. Barbaraann Cao to see later today   Acute metabolic encephalopathy Likely secondary to above Relatively awake and alert-Per family/patient this is a very subtle finding. TSH/B12/ammonia-stable  DM-2 (A1c 6.9 on 5/13) with uncontrolled hyperglycemia due to steroids Change SSI to resistant scale  Recent Labs    05/06/23 2203 05/07/23 0752 05/07/23 1148  GLUCAP 236* 249* 271*    HTN BP stable Clonidine/Coreg/lisinopril Follow/optimize  HLD Lipitor  Hypomagnesemia Repleted  BMI: Estimated body mass index is 26.34 kg/m as calculated from the following:   Height as of this encounter: 6' (1.829 m).   Weight as of this encounter: 88.1 kg.   Code status:   Code Status: Full Code   DVT Prophylaxis: SCDs Start: 05/05/23 2028   Family Communication: Spouse at bedside   Disposition Plan: Status is: Inpatient Remains inpatient appropriate because: Severity of illness   Planned Discharge Destination:Home   Diet: Diet Order             Diet regular Room service appropriate? Yes; Fluid consistency: Thin  Diet effective now  Antimicrobial agents: Anti-infectives (From admission, onward)    None        MEDICATIONS: Scheduled Meds:  atorvastatin  10 mg Oral QHS   carvedilol  12.5 mg Oral BID WC   cloNIDine  0.2 mg Oral BID   dexamethasone (DECADRON) injection  4 mg Intravenous Q6H   famotidine  20 mg Oral BID   insulin aspart  0-9 Units Subcutaneous TID WC   lisinopril  20 mg Oral BID   Continuous Infusions: PRN Meds:.acetaminophen **OR** acetaminophen, melatonin, ondansetron (ZOFRAN) IV   I have personally reviewed  following labs and imaging studies  LABORATORY DATA: CBC: Recent Labs  Lab 05/05/23 1301 05/06/23 0902  WBC 9.1 9.7  NEUTROABS 6.6 8.8*  HGB 12.5* 12.5*  HCT 38.6* 37.2*  MCV 93.0 92.5  PLT 202 198    Basic Metabolic Panel: Recent Labs  Lab 05/05/23 1301 05/06/23 0902 05/06/23 0903 05/07/23 0348  NA 136 135  --  133*  K 4.3 4.4  --  4.1  CL 101 100  --  102  CO2 25 22  --  23  GLUCOSE 148* 277*  --  224*  BUN 18 17  --  19  CREATININE 1.20 1.17  --  1.37*  CALCIUM 9.1 9.0  --  8.6*  MG  --  1.1* 1.1* 1.8    GFR: Estimated Creatinine Clearance: 50.3 mL/min (A) (by C-G formula based on SCr of 1.37 mg/dL (H)).  Liver Function Tests: Recent Labs  Lab 05/05/23 1301 05/06/23 0902  AST 18 18  ALT 15 17  ALKPHOS 62 70  BILITOT 0.5 0.7  PROT 7.2 6.6  ALBUMIN 3.8 3.4*   No results for input(s): "LIPASE", "AMYLASE" in the last 168 hours. Recent Labs  Lab 05/06/23 0902  AMMONIA 26    Coagulation Profile: Recent Labs  Lab 05/06/23 0902  INR 1.1    Cardiac Enzymes: Recent Labs  Lab 05/06/23 0902  CKTOTAL 74    BNP (last 3 results) No results for input(s): "PROBNP" in the last 8760 hours.  Lipid Profile: No results for input(s): "CHOL", "HDL", "LDLCALC", "TRIG", "CHOLHDL", "LDLDIRECT" in the last 72 hours.  Thyroid Function Tests: Recent Labs    05/06/23 0902  TSH 0.390    Anemia Panel: Recent Labs    05/06/23 0902  VITAMINB12 1,397*    Urine analysis:    Component Value Date/Time   COLORURINE YELLOW 05/05/2023 1411   APPEARANCEUR CLEAR 05/05/2023 1411   LABSPEC 1.020 05/05/2023 1411   PHURINE 5.0 05/05/2023 1411   GLUCOSEU NEGATIVE 05/05/2023 1411   HGBUR NEGATIVE 05/05/2023 1411   BILIRUBINUR NEGATIVE 05/05/2023 1411   KETONESUR NEGATIVE 05/05/2023 1411   PROTEINUR NEGATIVE 05/05/2023 1411   NITRITE NEGATIVE 05/05/2023 1411   LEUKOCYTESUR NEGATIVE 05/05/2023 1411    Sepsis Labs: Lactic Acid, Venous No results found for:  "LATICACIDVEN"  MICROBIOLOGY: Recent Results (from the past 240 hour(s))  SARS Coronavirus 2 by RT PCR (hospital order, performed in [facility] hospital lab) *cepheid single result test* Anterior Nasal Swab     Status: None   Collection Time: 05/05/23  1:01 PM   Specimen: Anterior Nasal Swab  Result Value Ref Range Status   SARS Coronavirus 2 by RT PCR NEGATIVE NEGATIVE Final    Comment: (NOTE) SARS-CoV-2 target nucleic acids are NOT DETECTED.  The SARS-CoV-2 RNA is generally detectable in upper and lower respiratory specimens during the acute phase of infection. The lowest concentration of SARS-CoV-2 viral copies this assay can  detect is 250 copies / mL. A negative result does not preclude SARS-CoV-2 infection and should not be used as the sole basis for treatment or other patient management decisions.  A negative result may occur with improper specimen collection / handling, submission of specimen other than nasopharyngeal swab, presence of viral mutation(s) within the areas targeted by this assay, and inadequate number of viral copies (<250 copies / mL). A negative result must be combined with clinical observations, patient history, and epidemiological information.  Fact Sheet for Patients:   RoadLapTop.co.za  Fact Sheet for Healthcare Providers: http://kim-miller.com/  This test is not yet approved or  cleared by the [place] FDA and has been authorized for detection and/or diagnosis of SARS-CoV-2 by FDA under an Emergency Use Authorization (EUA).  This EUA will remain in effect (meaning this test can be used) for the duration of the COVID-19 declaration under Section 564(b)(1) of the Act, 21 U.S.C. section 360bbb-3(b)(1), unless the authorization is terminated or revoked sooner.  Performed at [facility], [address] [place], [place] [address]     RADIOLOGY STUDIES/RESULTS: CT CHEST ABDOMEN PELVIS W  CONTRAST  Result Date: 05/06/2023 CLINICAL DATA:  Brain malignancy, primary search * Tracking Code: BO * EXAM: CT CHEST, ABDOMEN, AND PELVIS WITH CONTRAST TECHNIQUE: Multidetector CT imaging of the chest, abdomen and pelvis was performed following the standard protocol during bolus administration of intravenous contrast. RADIATION DOSE REDUCTION: This exam was performed according to the departmental dose-optimization program which includes automated exposure control, adjustment of the mA and/or kV according to patient size and/or use of iterative reconstruction technique. CONTRAST:  75mL OMNIPAQUE IOHEXOL 350 MG/ML SOLN COMPARISON:  None Available. FINDINGS: CT CHEST FINDINGS Cardiovascular: Aortic atherosclerosis. Normal heart size. Extensive three-vessel coronary artery calcifications and stents. No pericardial effusion. Mediastinum/Nodes: No enlarged mediastinal, hilar, or axillary lymph nodes. Thyroid gland, trachea, and esophagus demonstrate no significant findings. Lungs/Pleura: Bandlike scarring of the bilateral lung bases. No pleural effusion or pneumothorax. Musculoskeletal: No chest wall abnormality. No acute osseous findings. CT ABDOMEN PELVIS FINDINGS Hepatobiliary: No solid liver abnormality is seen. Calcified sludge and gallstones in the gallbladder. No gallbladder wall thickening, or biliary dilatation. Pancreas: Unremarkable. No pancreatic ductal dilatation or surrounding inflammatory changes. Spleen: Normal in size without significant abnormality. Benign cyst of the superior spleen, requiring no further follow-up or characterization. Adrenals/Urinary Tract: Adrenal glands are unremarkable. Nonobstructive calculi of the inferior pole of the left kidney. No right-sided calculi, ureteral calculi bladder is unremarkable. Stomach/Bowel: Stomach is within normal limits. Appendix appears normal. No evidence of bowel wall thickening, distention, or inflammatory changes. Descending and sigmoid  diverticulosis. Vascular/Lymphatic: Aortic atherosclerosis. No enlarged abdominal or pelvic lymph nodes. Reproductive: Prostatomegaly Other: No abdominal wall hernia or abnormality. No ascites. Musculoskeletal: No acute osseous findings. IMPRESSION: 1. No evidence of primary neoplasm or disease in the chest, abdomen, or pelvis. 2. Cholelithiasis 3. Nonobstructive left nephrolithiasis. 4. Descending and sigmoid diverticulosis without evidence of acute diverticulitis. 5. Prostatomegaly. 6. Coronary artery disease. Aortic Atherosclerosis (ICD10-I70.0). Electronically Signed   By: Jearld Lesch M.D.   On: 05/06/2023 16:37   MR Brain W and Wo Contrast  Result Date: 05/05/2023 CLINICAL DATA:  CNS neoplasm monitoring EXAM: MRI HEAD WITHOUT AND WITH CONTRAST TECHNIQUE: Multiplanar, multiecho pulse sequences of the brain and surrounding structures were obtained without and with intravenous contrast. CONTRAST:  9mL GADAVIST GADOBUTROL 1 MMOL/ML IV SOLN COMPARISON:  05/05/2023 CTA head neck FINDINGS: Brain: There is a large, predominantly peripherally contrast-enhancing mass  located within the right frontal lobe, measuring 6.8 x 4.9 cm. There is a large amount of surrounding hyperintense T2-weighted signal. There is marked mass effect on the right lateral ventricle and leftward midline shift 6 mm. There is early subfalcine herniation of the cingulate gyrus. No acute hemorrhage. The central necrotic component of the mass does not restrict diffusion. Vascular: Major flow voids are preserved. Skull and upper cervical spine: Normal calvarium and skull base. Visualized upper cervical spine and soft tissues are normal. Sinuses/Orbits:No paranasal sinus fluid levels or advanced mucosal thickening. No mastoid or middle ear effusion. Normal orbits. IMPRESSION: 1. Large, predominantly peripherally contrast-enhancing mass located within the right frontal lobe, measuring 6.8 x 4.9 cm. Large amount of surrounding hyperintense  T2-weighted signal. This is most consistent with a high-grade glioma. 2. Marked mass effect on the right lateral ventricle and 6 mm leftward midline shift. Early subfalcine herniation of the cingulate gyrus. Electronically Signed   By: Deatra Robinson M.D.   On: 05/05/2023 22:39   CT ANGIO HEAD NECK W WO CM  Result Date: 05/05/2023 CLINICAL DATA:  Fall.  Left-sided weakness. EXAM: CT CERVICAL SPINE WITHOUT CONTRAST CT ANGIOGRAPHY HEAD AND NECK WITH AND WITHOUT CONTRAST TECHNIQUE: Multidetector CT imaging of the cervical spine was performed without intravenous contrast. Multiplanar CT image reconstructions were also generated. Multidetector CT imaging of the head and neck was performed using the standard protocol during bolus administration of intravenous contrast. Multiplanar CT image reconstructions and MIPs were obtained to evaluate the vascular anatomy. Carotid stenosis measurements (when applicable) are obtained utilizing NASCET criteria, using the distal internal carotid diameter as the denominator. RADIATION DOSE REDUCTION: This exam was performed according to the departmental dose-optimization program which includes automated exposure control, adjustment of the mA and/or kV according to patient size and/or use of iterative reconstruction technique. CONTRAST:  75mL OMNIPAQUE IOHEXOL 350 MG/ML SOLN COMPARISON:  None Available. FINDINGS: CT HEAD FINDINGS Brain: There is a large 5.2 x 4.2 x 5.2 cm peripherally contrast-enhancing mass centered in the right frontal lobe. This mass is likely centrally necrotic. There is vasogenic edema surrounding this lesion with proximally 6 mm leftward midline shift. No hydrocephalus. No extra-axial fluid collection. No CT evidence of an acute cortical infarct. Vascular: No hyperdense vessel or unexpected calcification. Skull: Normal. Negative for fracture or focal lesion. Sinuses/Orbits: No middle ear or mastoid effusion. Paranasal sinuses are clear. Orbits are unremarkable.  Other: None. Review of the MIP images confirms the above findings CT CERVICAL SPINE FINDINGS: Alignment: Grade 1 anterolisthesis of C3 on C4 and C4 on C5. Skull base and vertebrae: No acute fracture. No primary bone lesion or focal pathologic process. Assessment of the lower cervical spine is limited due to a combination of streak artifact and osteopenia. Soft tissues and spinal canal: No prevertebral fluid or swelling. No visible canal hematoma. Disc levels:  No evidence of high-grade spinal canal stenosis. Upper chest: Negative. Other: None CTA NECK FINDINGS Aortic arch: Standard branching. Imaged portion shows no evidence of aneurysm or dissection. No significant stenosis of the major arch vessel origins. Right carotid system: No evidence of dissection, stenosis (50% or greater), or occlusion. Left carotid system: No evidence of dissection, stenosis (50% or greater), or occlusion. Vertebral arteries: Moderate to severe narrowing of the origin of the right vertebral artery. Moderate to severe narrowing in the V4 segment of the right vertebral artery. Mild narrowing of the origin of the left vertebral artery. No evidence of a dissection. Codominant. Skeleton: See above Other neck:  Negative. Upper chest: Negative. Review of the MIP images confirms the above findings CTA HEAD FINDINGS Anterior circulation: No significant stenosis, proximal occlusion, aneurysm, or vascular malformation. Posterior circulation: Mild narrowing of the P1 P2 junction of the left PCA. Otherwise no proximal occlusion, aneurysm, or vascular malformation. Venous sinuses: As permitted by contrast timing, patent. Anatomic variants: None Review of the MIP images confirms the above findings IMPRESSION: 1. Large 5.2 x 4.2 x 5.2 cm peripherally contrast-enhancing mass centered in the right frontal lobe with surrounding vasogenic edema and approximately 6 mm leftward midline shift. This could either represent a solitary metastatic lesion or a high  grade glial neoplasm. Recommend brain MRI with and without contrast for further evaluation. 2. No acute fracture or traumatic listhesis of the cervical spine. 3. Moderate to severe narrowing of the origin of the right vertebral artery and V4 segment of the right vertebral artery. Electronically Signed   By: Lorenza Cambridge M.D.   On: 05/05/2023 16:26   CT C-SPINE NO CHARGE  Result Date: 05/05/2023 CLINICAL DATA:  Fall.  Left-sided weakness. EXAM: CT CERVICAL SPINE WITHOUT CONTRAST CT ANGIOGRAPHY HEAD AND NECK WITH AND WITHOUT CONTRAST TECHNIQUE: Multidetector CT imaging of the cervical spine was performed without intravenous contrast. Multiplanar CT image reconstructions were also generated. Multidetector CT imaging of the head and neck was performed using the standard protocol during bolus administration of intravenous contrast. Multiplanar CT image reconstructions and MIPs were obtained to evaluate the vascular anatomy. Carotid stenosis measurements (when applicable) are obtained utilizing NASCET criteria, using the distal internal carotid diameter as the denominator. RADIATION DOSE REDUCTION: This exam was performed according to the departmental dose-optimization program which includes automated exposure control, adjustment of the mA and/or kV according to patient size and/or use of iterative reconstruction technique. CONTRAST:  75mL OMNIPAQUE IOHEXOL 350 MG/ML SOLN COMPARISON:  None Available. FINDINGS: CT HEAD FINDINGS Brain: There is a large 5.2 x 4.2 x 5.2 cm peripherally contrast-enhancing mass centered in the right frontal lobe. This mass is likely centrally necrotic. There is vasogenic edema surrounding this lesion with proximally 6 mm leftward midline shift. No hydrocephalus. No extra-axial fluid collection. No CT evidence of an acute cortical infarct. Vascular: No hyperdense vessel or unexpected calcification. Skull: Normal. Negative for fracture or focal lesion. Sinuses/Orbits: No middle ear or  mastoid effusion. Paranasal sinuses are clear. Orbits are unremarkable. Other: None. Review of the MIP images confirms the above findings CT CERVICAL SPINE FINDINGS: Alignment: Grade 1 anterolisthesis of C3 on C4 and C4 on C5. Skull base and vertebrae: No acute fracture. No primary bone lesion or focal pathologic process. Assessment of the lower cervical spine is limited due to a combination of streak artifact and osteopenia. Soft tissues and spinal canal: No prevertebral fluid or swelling. No visible canal hematoma. Disc levels:  No evidence of high-grade spinal canal stenosis. Upper chest: Negative. Other: None CTA NECK FINDINGS Aortic arch: Standard branching. Imaged portion shows no evidence of aneurysm or dissection. No significant stenosis of the major arch vessel origins. Right carotid system: No evidence of dissection, stenosis (50% or greater), or occlusion. Left carotid system: No evidence of dissection, stenosis (50% or greater), or occlusion. Vertebral arteries: Moderate to severe narrowing of the origin of the right vertebral artery. Moderate to severe narrowing in the V4 segment of the right vertebral artery. Mild narrowing of the origin of the left vertebral artery. No evidence of a dissection. Codominant. Skeleton: See above Other neck: Negative. Upper chest: Negative. Review of the  MIP images confirms the above findings CTA HEAD FINDINGS Anterior circulation: No significant stenosis, proximal occlusion, aneurysm, or vascular malformation. Posterior circulation: Mild narrowing of the P1 P2 junction of the left PCA. Otherwise no proximal occlusion, aneurysm, or vascular malformation. Venous sinuses: As permitted by contrast timing, patent. Anatomic variants: None Review of the MIP images confirms the above findings IMPRESSION: 1. Large 5.2 x 4.2 x 5.2 cm peripherally contrast-enhancing mass centered in the right frontal lobe with surrounding vasogenic edema and approximately 6 mm leftward midline  shift. This could either represent a solitary metastatic lesion or a high grade glial neoplasm. Recommend brain MRI with and without contrast for further evaluation. 2. No acute fracture or traumatic listhesis of the cervical spine. 3. Moderate to severe narrowing of the origin of the right vertebral artery and V4 segment of the right vertebral artery. Electronically Signed   By: Lorenza Cambridge M.D.   On: 05/05/2023 16:26   DG Chest Portable 1 View  Result Date: 05/05/2023 CLINICAL DATA:  Chest pain EXAM: PORTABLE CHEST 1 VIEW COMPARISON:  None Available. FINDINGS: Slight linear opacity left lung base likely scar or atelectasis. No consolidation, pneumothorax or effusion. No edema. Calcified aorta. Overlapping cardiac leads. IMPRESSION: Minimal left basilar atelectasis or scar. No acute cardiopulmonary disease Electronically Signed   By: Karen Kays M.D.   On: 05/05/2023 14:56     LOS: 2 days   Jeoffrey Massed, MD  [facility]    To contact the attending provider between 7A-7P or the covering provider during after hours 7P-7A, please log into the web site [email or web address] and access using universal [place] password for that web site. If you do not have the password, please call the hospital operator.  05/07/2023, 1:10 PM

## 2023-05-07 NOTE — Progress Notes (Signed)
   Providing Compassionate, Quality Care - Together  NEUROSURGERY PROGRESS NOTE   S: No issues overnight.   O: EXAM:  BP (!) 106/55   Pulse 65   Temp 98 F (36.7 C)   Resp 18   Ht 6' (1.829 m)   Wt 88.1 kg   SpO2 94%   BMI 26.34 kg/m   Awake, alert, oriented x 3 PERRL Speech fluent, appropriate  CNs grossly intact  Left upper/lower extremity 4+/5 throughout Right upper/lower extremity 5/5 throughout Mild pronator drift on the left  ASSESSMENT:  77 y.o. 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  I answered their questions and we went over expected outcomes and what treatment options would look like in the short and long-term.  They are awaiting discussion with Dr. Barbaraann Cao. -Likely no acute intervention tomorrow, if he decides to move forward with biopsy or surgical resection, Tuesday would likely be the earliest available day. -If he decides against treatment in totality or radiographic treatment only without biopsy, he may be able to go home this weekend. -Continue Decadron    Thank you for allowing me to participate in this patient's care.  Please do not hesitate to call with questions or concerns.   Monia Pouch, DO Neurosurgeon [facility] [phone]

## 2023-05-08 DIAGNOSIS — I10 Essential (primary) hypertension: Secondary | ICD-10-CM | POA: Diagnosis not present

## 2023-05-08 DIAGNOSIS — E119 Type 2 diabetes mellitus without complications: Secondary | ICD-10-CM | POA: Diagnosis not present

## 2023-05-08 DIAGNOSIS — E782 Mixed hyperlipidemia: Secondary | ICD-10-CM | POA: Diagnosis not present

## 2023-05-08 DIAGNOSIS — D499 Neoplasm of unspecified behavior of unspecified site: Secondary | ICD-10-CM | POA: Diagnosis not present

## 2023-05-08 LAB — GLUCOSE, CAPILLARY
Glucose-Capillary: 327 mg/dL — ABNORMAL HIGH (ref 70–99)
Glucose-Capillary: 217 mg/dL — ABNORMAL HIGH (ref 70–99)
Glucose-Capillary: 207 mg/dL — ABNORMAL HIGH (ref 70–99)
Glucose-Capillary: 214 mg/dL — ABNORMAL HIGH (ref 70–99)

## 2023-05-08 MED ORDER — HEPARIN SODIUM (PORCINE) 5000 UNIT/ML IJ SOLN
5000.0000 [IU] | Freq: Three times a day (TID) | INTRAMUSCULAR | Status: DC
  Administered 2023-05-08 – 2023-05-11 (×9): 5000 [IU] via SUBCUTANEOUS
  Filled 2023-05-08 (×9): qty 1

## 2023-05-08 MED ORDER — INSULIN GLARGINE-YFGN 100 UNIT/ML ~~LOC~~ SOLN
8.0000 [IU] | Freq: Every day | SUBCUTANEOUS | Status: DC
  Administered 2023-05-08: 8 [IU] via SUBCUTANEOUS
  Filled 2023-05-08 (×2): qty 0.08

## 2023-05-08 NOTE — Progress Notes (Signed)
   Providing Compassionate, Quality Care - Together  NEUROSURGERY PROGRESS NOTE   S: No issues overnight.   O: EXAM:  BP (!) 125/92 (BP Location: Right Arm)   Pulse 70   Temp 97.9 F (36.6 C) (Oral)   Resp 18   Ht 6' (1.829 m)   Wt 88.1 kg   SpO2 93%   BMI 26.34 kg/m   Awake, alert, oriented x 3 PERRL Speech fluent, appropriate  CNs grossly intact  Left upper/lower extremity 4+/5 throughout Right upper/lower extremity 5/5 throughout Mild pronator drift on the left   ASSESSMENT:  77 y.o. 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  They would like to proceed with surgical resection.  Therefore I am posting the case for Tuesday, I have made the patient n.p.o. Monday night. -Okay for subcu heparin for DVT prophylaxis over the weekend -We extensively went over the risks benefits and expected outcomes.  They had a discussion with Dr. Barbaraann Cao who agrees with surgical resection. -Continue Decadron    Thank you for allowing me to participate in this patient's care.  Please do not hesitate to call with questions or concerns.   Monia Pouch, DO Neurosurgeon [facility] [phone]

## 2023-05-08 NOTE — Evaluation (Signed)
Physical Therapy Evaluation Patient Details Name: Alex Church MRN: [id number] DOB: 1946-03-26 Today's Date: 05/08/2023  History of Present Illness  Pt is a 77 y/o male presenting 8/27 with worsening 4-day h/o Left sided weakness.  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  PMHx:  DM, HTN, HLD  Clinical Impression  Pt admitted with/for worsening L sided weakness due to found brain mass.  Pt currently limited functionally due to the problems listed below.  (see problems list.)  Pt will benefit from PT to maximize function and safety to be able to get home safely with available assist.         If plan is discharge home, recommend the following: A little help with walking and/or transfers;Two people to help with bathing/dressing/bathroom   Can travel by private vehicle        Equipment Recommendations Rolling walker (2 wheels)  Recommendations for Other Services       Functional Status Assessment Patient has had a recent decline in their functional status and demonstrates the ability to make significant improvements in function in a reasonable and predictable amount of time.     Precautions / Restrictions Precautions Precautions: Fall      Mobility  Bed Mobility Overal bed mobility: Modified Independent                  Transfers Overall transfer level: Needs assistance   Transfers: Sit to/from Stand Sit to Stand: Min assist           General transfer comment: min stability until pt fully upright in the RW    Ambulation/Gait Ambulation/Gait assistance: Min assist Gait Distance (Feet): 160 Feet Assistive device: Rolling walker (2 wheels) Gait Pattern/deviations: Step-through pattern   Gait velocity interpretation: <1.8 ft/sec, indicate of risk for recurrent falls   General Gait Details: mild unsteadiness, left list/ lateral cervical flex to the left.  Drift L with the RW with uncoordinated advancement of the L>R  LE.  Stairs            Wheelchair Mobility     Tilt Bed    Modified Rankin (Stroke Patients Only)       Balance Overall balance assessment: Needs assistance Sitting-balance support: Single extremity supported, Bilateral upper extremity supported, Feet supported Sitting balance-Leahy Scale: Poor Sitting balance - Comments: list L only the left elbow.  Pt needing L UE to move toward midline   Standing balance support: Bilateral upper extremity supported, During functional activity Standing balance-Leahy Scale: Poor Standing balance comment: consistent list left and unable to fully self correct to midline, mod assist to correct to upright midline posture.                             Pertinent Vitals/Pain Pain Assessment Pain Assessment: No/denies pain    Home Living Family/patient expects to be discharged to:: Private residence Living Arrangements: Spouse/significant other Available Help at Discharge: Family;Available 24 hours/day Type of Home: House Home Access: Stairs to enter       Home Layout: One level Home Equipment: Agricultural consultant (2 wheels)      Prior Function Prior Level of Function : Independent/Modified Independent;Driving               ADLs Comments: Independent with ADL's until recently     Extremity/Trunk Assessment   Upper Extremity Assessment Upper Extremity Assessment: Overall WFL for tasks assessed (L UE mildly weaker than  R UE)    Lower Extremity Assessment Lower Extremity Assessment: Overall WFL for tasks assessed (L LE noticeably weaker than R LE with some incoordination.)       Communication   Communication Communication: No apparent difficulties  Cognition Arousal: Alert Behavior During Therapy: WFL for tasks assessed/performed Overall Cognitive Status:  (family reports some cognitive decline, forgetfulness last 4 months)                                          General Comments General  comments (skin integrity, edema, etc.): vss on RA    Exercises     Assessment/Plan    PT Assessment Patient needs continued PT services  PT Problem List Decreased strength;Decreased activity tolerance;Decreased balance;Decreased mobility;Decreased coordination;Decreased safety awareness       PT Treatment Interventions      PT Goals (Current goals can be found in the Care Plan section)  Acute Rehab PT Goals Patient Stated Goal: HAVE  surgery, back indepedent PT Goal Formulation: With patient Time For Goal Achievement: 05/22/23 Potential to Achieve Goals: Good    Frequency Min 1X/week     Co-evaluation               AM-PAC PT "6 Clicks" Mobility  Outcome Measure Help needed turning from your back to your side while in a flat bed without using bedrails?: A Little Help needed moving from lying on your back to sitting on the side of a flat bed without using bedrails?: A Little Help needed moving to and from a bed to a chair (including a wheelchair)?: A Little Help needed standing up from a chair using your arms (e.g., wheelchair or bedside chair)?: A Little Help needed to walk in hospital room?: A Little Help needed climbing 3-5 steps with a railing? : A Little 6 Click Score: 18    End of Session   Activity Tolerance: Patient tolerated treatment well;Patient limited by fatigue Patient left: in bed;with call bell/phone within reach;with bed alarm set Nurse Communication: Mobility status PT Visit Diagnosis: Unsteadiness on feet (R26.81);Other symptoms and signs involving the nervous system (R29.898)    Time: 1457-1520 PT Time Calculation (min) (ACUTE ONLY): 23 min   Charges:   PT Evaluation $PT Eval Moderate Complexity: 1 Mod PT Treatments $Gait Training: 8-22 mins PT General Charges $$ ACUTE PT VISIT: 1 Visit         05/08/2023  Jacinto Halim., PT Acute Rehabilitation Services [phone]  (office)  Eliseo Gum Paxton Kanaan 05/08/2023, 3:39 PM

## 2023-05-08 NOTE — Plan of Care (Signed)
  Problem: Education: Goal: Knowledge of General Education information will improve Description: Including pain rating scale, medication(s)/side effects and non-pharmacologic comfort measures Outcome: Progressing   Problem: Health Behavior/Discharge Planning: Goal: Ability to manage health-related needs will improve Outcome: Progressing   Problem: Clinical Measurements: Goal: Ability to maintain clinical measurements within normal limits will improve Outcome: Progressing Goal: Will remain free from infection Outcome: Progressing Goal: Diagnostic test results will improve Outcome: Progressing Goal: Respiratory complications will improve Outcome: Progressing Goal: Cardiovascular complication will be avoided Outcome: Progressing   Problem: Activity: Goal: Risk for activity intolerance will decrease Outcome: Progressing   Problem: Nutrition: Goal: Adequate nutrition will be maintained Outcome: Progressing   Problem: Coping: Goal: Level of anxiety will decrease Outcome: Progressing   Problem: Pain Managment: Goal: General experience of comfort will improve Outcome: Progressing   Problem: Safety: Goal: Ability to remain free from injury will improve Outcome: Progressing   Problem: Skin Integrity: Goal: Risk for impaired skin integrity will decrease Outcome: Progressing   Problem: Fluid Volume: Goal: Ability to maintain a balanced intake and output will improve Outcome: Progressing

## 2023-05-08 NOTE — Progress Notes (Signed)
? ?  Inpatient Rehab Admissions Coordinator : ? ?Per therapy recommendations, patient was screened for CIR candidacy by Barbara Boyette RN MSN.  At this time patient appears to be a potential candidate for CIR. I will place a rehab consult per protocol for full assessment. Please call me with any questions. ? ?Barbara Boyette RN MSN ?Admissions Coordinator ?[phone] ?  ?

## 2023-05-08 NOTE — Progress Notes (Signed)
PROGRESS NOTE        PATIENT DETAILS Name: Alex Church Age: 77 y.o. Sex: male Date of Birth: 1946-05-06 Admit Date: 05/05/2023 Admitting Physician Azucena Fallen, MD PCP:Paz, Nolon Rod, MD  Brief Summary: Patient is a 77 y.o.  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 8/28>> CT abdomen/pelvis: No evidence of primary neoplasm in chest/abdomen/pelvis.  Significant microbiology data: 8/27>> COVID PCR: Negative  Procedures: None  Consults: Neurosurgery Neuro-oncology  Subjective: No-has decided to proceed with craniotomy-had a long conversation with Dr. Barbaraann Cao yesterday.  Objective: Vitals: Blood pressure 123/62, pulse 86, temperature 97.8 F (36.6 C), temperature source Oral, resp. rate 17, height 6' (1.829 m), weight 88.1 kg, SpO2 94%.   Exam: Gen Exam:Alert awake-not in any distress HEENT:atraumatic, normocephalic Chest: B/L clear to auscultation anteriorly CVS:S1S2 regular Abdomen:soft non tender, non distended Extremities:no edema Neurology: Non focal Skin: no rash  Pertinent Labs/Radiology:    Latest Ref Rng & Units 05/06/2023    9:02 AM 05/05/2023    1:01 PM 01/19/2023    4:01 PM  CBC  WBC 4.0 - 10.5 K/uL 9.7  9.1  9.4   Hemoglobin 13.0 - 17.0 g/dL 69.6  29.5  28.4   Hematocrit 39.0 - 52.0 % 37.2  38.6  39.2   Platelets 150 - 400 K/uL 198  202  208.0     Lab Results  Component Value Date   NA 133 (L) 05/07/2023   K 4.1  05/07/2023   CL 102 05/07/2023   CO2 23 05/07/2023      Assessment/Plan: 7 cm right frontal brain mass with mass effect/edema Suspicion for high-grade glioma After extensive discussion with neurosurgery and then with neuro-oncology-patient has now decided to proceed with craniotomy/excision of the brain mass-this is tentatively scheduled for this coming Tuesday In the interim-continue Decadron PT/OT eval-mobilize as much as possible.  Acute metabolic encephalopathy Likely secondary to above Relatively awake and alert-Per family/patient this is a very subtle finding. TSH/B12/ammonia-stable  DM-2 (A1c 6.9 on 5/13) with uncontrolled hyperglycemia due to steroids CBGs still uncontrolled Add low-dose Semglee Continue SSI resistant scale Reassess 8/31.    Recent Labs    05/07/23 2042 05/08/23 0800 05/08/23 1120  GLUCAP 287* 327* 217*    HTN BP stable Clonidine/Coreg/lisinopril Follow/optimize  HLD Lipitor  Hypomagnesemia Repleted  BMI: Estimated body mass index is 26.34 kg/m as calculated from the following:   Height as of this encounter: 6' (1.829 m).   Weight as of this encounter: 88.1 kg.   Code status:   Code Status: Full Code   DVT Prophylaxis: SCDs Start: 05/05/23 2028   Family Communication: Spouse at bedside   Disposition Plan: Status is: Inpatient Remains inpatient appropriate because: Severity of illness   Planned Discharge Destination:Home   Diet: Diet Order             Diet regular Room service appropriate? Yes; Fluid consistency: Thin  Diet effective now                     Antimicrobial agents: Anti-infectives (From admission, onward)    None        MEDICATIONS: Scheduled Meds:  atorvastatin  10 mg Oral QHS   carvedilol  12.5 mg Oral BID WC   cloNIDine  0.2 mg Oral BID   dexamethasone (DECADRON) injection  4 mg Intravenous Q6H   famotidine  20 mg Oral BID   insulin aspart  0-20 Units Subcutaneous TID WC   insulin  glargine-yfgn  8 Units Subcutaneous Daily   lisinopril  20 mg Oral BID   Continuous Infusions: PRN Meds:.acetaminophen **OR** acetaminophen, alum & mag hydroxide-simeth, melatonin, ondansetron (ZOFRAN) IV   I have personally reviewed following labs and imaging studies  LABORATORY DATA: CBC: Recent Labs  Lab 05/05/23 1301 05/06/23 0902  WBC 9.1 9.7  NEUTROABS 6.6 8.8*  HGB 12.5* 12.5*  HCT 38.6* 37.2*  MCV 93.0 92.5  PLT 202 198    Basic Metabolic Panel: Recent Labs  Lab 05/05/23 1301 05/06/23 0902 05/06/23 0903 05/07/23 0348  NA 136 135  --  133*  K 4.3 4.4  --  4.1  CL 101 100  --  102  CO2 25 22  --  23  GLUCOSE 148* 277*  --  224*  BUN 18 17  --  19  CREATININE 1.20 1.17  --  1.37*  CALCIUM 9.1 9.0  --  8.6*  MG  --  1.1* 1.1* 1.8    GFR: Estimated Creatinine Clearance: 50.3 mL/min (A) (by C-G formula based on SCr of 1.37 mg/dL (H)).  Liver Function Tests: Recent Labs  Lab 05/05/23 1301 05/06/23 0902  AST 18 18  ALT 15 17  ALKPHOS 62 70  BILITOT 0.5 0.7  PROT 7.2 6.6  ALBUMIN 3.8 3.4*   No results for input(s): "LIPASE", "AMYLASE" in the last 168 hours. Recent Labs  Lab 05/06/23 0902  AMMONIA 26    Coagulation Profile: Recent Labs  Lab 05/06/23 0902  INR 1.1    Cardiac Enzymes: Recent Labs  Lab 05/06/23 0902  CKTOTAL 74    BNP (last 3 results) No results for input(s): "PROBNP" in the last 8760 hours.  Lipid Profile: No results for input(s): "CHOL", "HDL", "LDLCALC", "TRIG", "CHOLHDL", "LDLDIRECT" in the last 72 hours.  Thyroid Function Tests: Recent Labs    05/06/23 0902  TSH 0.390    Anemia Panel: Recent Labs    05/06/23 0902  VITAMINB12 1,397*    Urine analysis:    Component Value Date/Time   COLORURINE YELLOW 05/05/2023 1411   APPEARANCEUR CLEAR 05/05/2023 1411   LABSPEC 1.020 05/05/2023 1411   PHURINE 5.0 05/05/2023 1411   GLUCOSEU NEGATIVE 05/05/2023 1411   HGBUR NEGATIVE 05/05/2023 1411   BILIRUBINUR  NEGATIVE 05/05/2023 1411   KETONESUR NEGATIVE 05/05/2023 1411   PROTEINUR NEGATIVE 05/05/2023 1411   NITRITE NEGATIVE 05/05/2023 1411   LEUKOCYTESUR NEGATIVE 05/05/2023 1411    Sepsis Labs: Lactic Acid, Venous No results found for: "LATICACIDVEN"  MICROBIOLOGY: Recent Results (from the past 240 hour(s))  SARS Coronavirus 2 by RT PCR (hospital order, performed in [facility] hospital lab) *cepheid single result test* Anterior Nasal Swab     Status: None   Collection Time: 05/05/23  1:01 PM   Specimen: Anterior Nasal Swab  Result Value Ref Range Status   SARS Coronavirus 2 by RT PCR NEGATIVE NEGATIVE Final    Comment: (NOTE)  SARS-CoV-2 target nucleic acids are NOT DETECTED.  The SARS-CoV-2 RNA is generally detectable in upper and lower respiratory specimens during the acute phase of infection. The lowest concentration of SARS-CoV-2 viral copies this assay can detect is 250 copies / mL. A negative result does not preclude SARS-CoV-2 infection and should not be used as the sole basis for treatment or other patient management decisions.  A negative result may occur with improper specimen collection / handling, submission of specimen other than nasopharyngeal swab, presence of viral mutation(s) within the areas targeted by this assay, and inadequate number of viral copies (<250 copies / mL). A negative result must be combined with clinical observations, patient history, and epidemiological information.  Fact Sheet for Patients:   RoadLapTop.co.za  Fact Sheet for Healthcare Providers: http://kim-miller.com/  This test is not yet approved or  cleared by the [place] FDA and has been authorized for detection and/or diagnosis of SARS-CoV-2 by FDA under an Emergency Use Authorization (EUA).  This EUA will remain in effect (meaning this test can be used) for the duration of the COVID-19 declaration under Section 564(b)(1) of the Act, 21  U.S.C. section 360bbb-3(b)(1), unless the authorization is terminated or revoked sooner.  Performed at [facility], [address] [place], [place] [address]     RADIOLOGY STUDIES/RESULTS: CT CHEST ABDOMEN PELVIS W CONTRAST  Result Date: 05/06/2023 CLINICAL DATA:  Brain malignancy, primary search * Tracking Code: BO * EXAM: CT CHEST, ABDOMEN, AND PELVIS WITH CONTRAST TECHNIQUE: Multidetector CT imaging of the chest, abdomen and pelvis was performed following the standard protocol during bolus administration of intravenous contrast. RADIATION DOSE REDUCTION: This exam was performed according to the departmental dose-optimization program which includes automated exposure control, adjustment of the mA and/or kV according to patient size and/or use of iterative reconstruction technique. CONTRAST:  75mL OMNIPAQUE IOHEXOL 350 MG/ML SOLN COMPARISON:  None Available. FINDINGS: CT CHEST FINDINGS Cardiovascular: Aortic atherosclerosis. Normal heart size. Extensive three-vessel coronary artery calcifications and stents. No pericardial effusion. Mediastinum/Nodes: No enlarged mediastinal, hilar, or axillary lymph nodes. Thyroid gland, trachea, and esophagus demonstrate no significant findings. Lungs/Pleura: Bandlike scarring of the bilateral lung bases. No pleural effusion or pneumothorax. Musculoskeletal: No chest wall abnormality. No acute osseous findings. CT ABDOMEN PELVIS FINDINGS Hepatobiliary: No solid liver abnormality is seen. Calcified sludge and gallstones in the gallbladder. No gallbladder wall thickening, or biliary dilatation. Pancreas: Unremarkable. No pancreatic ductal dilatation or surrounding inflammatory changes. Spleen: Normal in size without significant abnormality. Benign cyst of the superior spleen, requiring no further follow-up or characterization. Adrenals/Urinary Tract: Adrenal glands are unremarkable. Nonobstructive calculi of the inferior pole of the left kidney. No  right-sided calculi, ureteral calculi bladder is unremarkable. Stomach/Bowel: Stomach is within normal limits. Appendix appears normal. No evidence of bowel wall thickening, distention, or inflammatory changes. Descending and sigmoid diverticulosis. Vascular/Lymphatic: Aortic atherosclerosis. No enlarged abdominal or pelvic lymph nodes. Reproductive: Prostatomegaly Other: No abdominal wall hernia or abnormality. No ascites. Musculoskeletal: No acute osseous findings. IMPRESSION: 1. No evidence of primary neoplasm or disease in the chest, abdomen, or pelvis. 2. Cholelithiasis 3. Nonobstructive left nephrolithiasis. 4. Descending and sigmoid diverticulosis without evidence of acute diverticulitis. 5. Prostatomegaly. 6. Coronary artery disease. Aortic Atherosclerosis (ICD10-I70.0). Electronically Signed   By: Jearld Lesch M.D.   On: 05/06/2023 16:37     LOS: 3 days   Jeoffrey Massed, MD  [facility]    To contact the attending provider between 7A-7P or the covering provider during after hours  7P-7A, please log into the web site [email or web address] and access using universal [place] password for that web site. If you do not have the password, please call the hospital operator.  05/08/2023, 12:14 PM

## 2023-05-09 DIAGNOSIS — E119 Type 2 diabetes mellitus without complications: Secondary | ICD-10-CM | POA: Diagnosis not present

## 2023-05-09 DIAGNOSIS — D499 Neoplasm of unspecified behavior of unspecified site: Secondary | ICD-10-CM | POA: Diagnosis not present

## 2023-05-09 DIAGNOSIS — I10 Essential (primary) hypertension: Secondary | ICD-10-CM | POA: Diagnosis not present

## 2023-05-09 DIAGNOSIS — E782 Mixed hyperlipidemia: Secondary | ICD-10-CM | POA: Diagnosis not present

## 2023-05-09 LAB — GLUCOSE, CAPILLARY
Glucose-Capillary: 315 mg/dL — ABNORMAL HIGH (ref 70–99)
Glucose-Capillary: 198 mg/dL — ABNORMAL HIGH (ref 70–99)
Glucose-Capillary: 223 mg/dL — ABNORMAL HIGH (ref 70–99)
Glucose-Capillary: 353 mg/dL — ABNORMAL HIGH (ref 70–99)

## 2023-05-09 LAB — BASIC METABOLIC PANEL
Anion gap: 8 (ref 5–15)
BUN: 32 mg/dL — ABNORMAL HIGH (ref 8–23)
CO2: 23 mmol/L (ref 22–32)
Calcium: 8.6 mg/dL — ABNORMAL LOW (ref 8.9–10.3)
Chloride: 101 mmol/L (ref 98–111)
Creatinine, Ser: 1.18 mg/dL (ref 0.61–1.24)
GFR, Estimated: >60 mL/min (ref >60)
Glucose, Bld: 260 mg/dL — ABNORMAL HIGH (ref 70–99)
Potassium: 4 mmol/L (ref 3.5–5.1)
Sodium: 132 mmol/L — ABNORMAL LOW (ref 135–145)

## 2023-05-09 LAB — CBC
HCT: 38.1 % — ABNORMAL LOW (ref 39.0–52.0)
Hemoglobin: 12.5 g/dL — ABNORMAL LOW (ref 13.0–17.0)
MCH: 30.3 pg (ref 26.0–34.0)
MCHC: 32.8 g/dL (ref 30.0–36.0)
MCV: 92.3 fL (ref 80.0–100.0)
Platelets: 198 10*3/uL (ref 150–400)
RBC: 4.13 MIL/uL — ABNORMAL LOW (ref 4.22–5.81)
RDW: 14.5 % (ref 11.5–15.5)
WBC: 14.7 10*3/uL — ABNORMAL HIGH (ref 4.0–10.5)
nRBC: 0 % (ref 0.0–0.2)

## 2023-05-09 MED ORDER — ORAL CARE MOUTH RINSE: 15.0000 mL | OROMUCOSAL | Status: DC | PRN

## 2023-05-09 MED ORDER — INSULIN GLARGINE-YFGN 100 UNIT/ML ~~LOC~~ SOLN
12.0000 [IU] | Freq: Every day | SUBCUTANEOUS | Status: DC
  Administered 2023-05-09: 12 [IU] via SUBCUTANEOUS
  Filled 2023-05-09 (×2): qty 0.12

## 2023-05-09 MED ORDER — INSULIN ASPART 100 UNIT/ML IJ SOLN
0.0000 [IU] | Freq: Three times a day (TID) | INTRAMUSCULAR | Status: DC
  Administered 2023-05-10: 3 [IU] via SUBCUTANEOUS
  Administered 2023-05-10 – 2023-05-11 (×3): 7 [IU] via SUBCUTANEOUS
  Administered 2023-05-11 – 2023-05-12 (×2): 4 [IU] via SUBCUTANEOUS

## 2023-05-09 MED ORDER — INSULIN ASPART 100 UNIT/ML IJ SOLN
0.0000 [IU] | Freq: Every day | INTRAMUSCULAR | Status: DC
  Administered 2023-05-09: 5 [IU] via SUBCUTANEOUS
  Administered 2023-05-11: 2 [IU] via SUBCUTANEOUS

## 2023-05-09 MED ORDER — INSULIN ASPART 100 UNIT/ML IJ SOLN
4.0000 [IU] | Freq: Three times a day (TID) | INTRAMUSCULAR | Status: DC
  Administered 2023-05-09 (×2): 4 [IU] via SUBCUTANEOUS

## 2023-05-09 NOTE — TOC Initial Note (Signed)
Transition of Care [facility]) - Initial/Assessment Note    Patient Details  Name: Alex Church MRN: [id number] Date of Birth: April 22, 1946  [facility] CM/SW Contact:    Mearl Latin, LCSW Phone Number: 05/09/2023, 11:29 AM  Clinical Narrative:                 Patient admitted from home with spouse. Will follow for therapy needs.     Barriers to Discharge: Continued Medical Work up, English as a second language teacher   Patient Goals and CMS Choice            Expected Discharge Plan and Services       Living arrangements for the past 2 months: Single Family Home                                      Prior Living Arrangements/Services Living arrangements for the past 2 months: Single Family Home Lives with:: Spouse Patient language and need for interpreter reviewed:: Yes        Need for Family Participation in Patient Care: Yes (Comment) Care giver support system in place?: Yes (comment)   Criminal Activity/Legal Involvement Pertinent to Current Situation/Hospitalization: No - Comment as needed  Activities of Daily Living Home Assistive Devices/Equipment: Blood pressure cuff, CBG Meter, Eyeglasses, Hand-held shower hose, Reacher, Scales ADL Screening (condition at time of admission) Patient's cognitive ability adequate to safely complete daily activities?: Yes Is the patient deaf or have difficulty hearing?: No Does the patient have difficulty seeing, even when wearing glasses/contacts?: Yes Does the patient have difficulty concentrating, remembering, or making decisions?: Yes (forgetful) Patient able to express need for assistance with ADLs?: Yes Does the patient have difficulty dressing or bathing?: No Independently performs ADLs?: Yes (appropriate for developmental age) Does the patient have difficulty walking or climbing stairs?: No Weakness of Legs: Left Weakness of Arms/Hands: Left  Permission Sought/Granted                  Emotional  Assessment Appearance:: Appears stated age     Orientation: : Oriented to Self, Oriented to Place, Oriented to  Time, Oriented to Situation Alcohol / Substance Use: Not Applicable Psych Involvement: No (comment)  Admission diagnosis:  Brain mass [G93.89] Left-sided weakness [R53.1] 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) [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.9, G93.5] Patient Active Problem List   Diagnosis Date Noted   Left-sided weakness 05/06/2023   Acute encephalopathy 05/06/2023   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 [place] [address] Phone: [phone] Fax: [phone]  [id number] - [place], [place] - [address] AT [facility] [address] [place] [address] Phone: [phone] Fax: [phone]     Social Determinants of Health (SDOH) Social History: SDOH Screenings   Food Insecurity: No Food Insecurity (05/06/2023)  Housing: Low Risk  (05/06/2023)  Transportation Needs: No Transportation Needs (05/06/2023)  Utilities: Not At Risk (05/06/2023)  Alcohol Screen: Low Risk  (10/21/2022)  Depression (PHQ2-9): Low Risk  (05/05/2023)  Financial Resource Strain: Low Risk  (10/21/2022)  Physical Activity: Sufficiently Active (10/10/2021)  Social Connections: Moderately Isolated (10/10/2021)  Stress: No Stress Concern  Present (10/21/2022)  Tobacco Use: Low Risk  (05/06/2023)   SDOH Interventions:     Readmission Risk Interventions     No data to display

## 2023-05-09 NOTE — Progress Notes (Signed)
PROGRESS NOTE        PATIENT DETAILS Name: Alex Church Age: 77 y.o. Sex: male Date of Birth: 11-10-1945 Admit Date: 05/05/2023 Admitting Physician Azucena Fallen, MD PCP:Paz, Nolon Rod, MD  Brief Summary: Patient is a 77 y.o.  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 8/28>> CT abdomen/pelvis: No evidence of primary neoplasm in chest/abdomen/pelvis.  Significant microbiology data: 8/27>> COVID PCR: Negative  Procedures: None  Consults: Neurosurgery Neuro-oncology  Subjective: No complaints.  Objective: Vitals: Blood pressure 135/63, pulse 68, temperature 97.9 F (36.6 C), temperature source Oral, resp. rate 18, height 6' (1.829 m), weight 87.9 kg, SpO2 94%.   Exam: Awake/alert Nonfocal exam.  Pertinent Labs/Radiology:    Latest Ref Rng & Units 05/09/2023    4:08 AM 05/06/2023    9:02 AM 05/05/2023    1:01 PM  CBC  WBC 4.0 - 10.5 K/uL 14.7  9.7  9.1   Hemoglobin 13.0 - 17.0 g/dL 16.1  09.6  04.5   Hematocrit 39.0 - 52.0 % 38.1  37.2  38.6   Platelets 150 - 400 K/uL 198  198  202     Lab Results  Component Value Date   NA 132 (L) 05/09/2023   K 4.0 05/09/2023   CL 101 05/09/2023   CO2 23 05/09/2023      Assessment/Plan: 7 cm right frontal brain mass with mass effect/edema Suspicion for high-grade glioma After extensive discussion with neurosurgery and then with neuro-oncology-patient has now decided to proceed with  craniotomy/excision of the brain mass-this is tentatively scheduled for this coming Tuesday In the interim-continue Decadron PT/OT eval-mobilize as much as possible.  Acute metabolic encephalopathy Likely secondary to above Relatively awake and alert-Per family/patient this is a very subtle finding. TSH/B12/ammonia-stable  DM-2 (A1c 6.9 on 5/13) with uncontrolled hyperglycemia due to steroids CBGs still uncontrolled Semglee increased to 12 units Add 4 units of scheduled NovoLog Continue SSI resistant scale Reassess 9/1  Recent Labs    05/08/23 1640 05/08/23 2104 05/09/23 0839  GLUCAP 207* 214* 315*    HTN BP stable Clonidine/Coreg/lisinopril Follow/optimize  HLD Lipitor  Hypomagnesemia Repleted  BMI: Estimated body mass index is 26.28 kg/m as calculated from the following:   Height as of this encounter: 6' (1.829 m).   Weight as of this encounter: 87.9 kg.   Code status:   Code Status: Full Code   DVT Prophylaxis: heparin injection 5,000 Units Start: 05/08/23 2200 SCDs Start: 05/05/23 2028   Family Communication: Spouse at bedside   Disposition Plan: Status is: Inpatient Remains inpatient appropriate because: Severity of illness   Planned Discharge Destination:Home   Diet: Diet Order             Diet regular Room service appropriate? Yes; Fluid consistency: Thin  Diet effective now  Antimicrobial agents: Anti-infectives (From admission, onward)    None        MEDICATIONS: Scheduled Meds:  atorvastatin  10 mg Oral QHS   carvedilol  12.5 mg Oral BID WC   cloNIDine  0.2 mg Oral BID   dexamethasone (DECADRON) injection  4 mg Intravenous Q6H   famotidine  20 mg Oral BID   heparin injection (subcutaneous)  5,000 Units Subcutaneous Q8H   insulin aspart  0-20 Units Subcutaneous TID WC   insulin glargine-yfgn  12 Units Subcutaneous Daily   lisinopril  20 mg Oral BID   Continuous Infusions: PRN Meds:.acetaminophen  **OR** acetaminophen, alum & mag hydroxide-simeth, melatonin, ondansetron (ZOFRAN) IV   I have personally reviewed following labs and imaging studies  LABORATORY DATA: CBC: Recent Labs  Lab 05/05/23 1301 05/06/23 0902 05/09/23 0408  WBC 9.1 9.7 14.7*  NEUTROABS 6.6 8.8*  --   HGB 12.5* 12.5* 12.5*  HCT 38.6* 37.2* 38.1*  MCV 93.0 92.5 92.3  PLT 202 198 198    Basic Metabolic Panel: Recent Labs  Lab 05/05/23 1301 05/06/23 0902 05/06/23 0903 05/07/23 0348 05/09/23 0408  NA 136 135  --  133* 132*  K 4.3 4.4  --  4.1 4.0  CL 101 100  --  102 101  CO2 25 22  --  23 23  GLUCOSE 148* 277*  --  224* 260*  BUN 18 17  --  19 32*  CREATININE 1.20 1.17  --  1.37* 1.18  CALCIUM 9.1 9.0  --  8.6* 8.6*  MG  --  1.1* 1.1* 1.8  --     GFR: Estimated Creatinine Clearance: 58.5 mL/min (by C-G formula based on SCr of 1.18 mg/dL).  Liver Function Tests: Recent Labs  Lab 05/05/23 1301 05/06/23 0902  AST 18 18  ALT 15 17  ALKPHOS 62 70  BILITOT 0.5 0.7  PROT 7.2 6.6  ALBUMIN 3.8 3.4*   No results for input(s): "LIPASE", "AMYLASE" in the last 168 hours. Recent Labs  Lab 05/06/23 0902  AMMONIA 26    Coagulation Profile: Recent Labs  Lab 05/06/23 0902  INR 1.1    Cardiac Enzymes: Recent Labs  Lab 05/06/23 0902  CKTOTAL 74    BNP (last 3 results) No results for input(s): "PROBNP" in the last 8760 hours.  Lipid Profile: No results for input(s): "CHOL", "HDL", "LDLCALC", "TRIG", "CHOLHDL", "LDLDIRECT" in the last 72 hours.  Thyroid Function Tests: No results for input(s): "TSH", "T4TOTAL", "FREET4", "T3FREE", "THYROIDAB" in the last 72 hours.   Anemia Panel: No results for input(s): "VITAMINB12", "FOLATE", "FERRITIN", "TIBC", "IRON", "RETICCTPCT" in the last 72 hours.   Urine analysis:    Component Value Date/Time   COLORURINE YELLOW 05/05/2023 1411   APPEARANCEUR CLEAR 05/05/2023 1411   LABSPEC 1.020 05/05/2023 1411   PHURINE 5.0 05/05/2023 1411    GLUCOSEU NEGATIVE 05/05/2023 1411   HGBUR NEGATIVE 05/05/2023 1411   BILIRUBINUR NEGATIVE 05/05/2023 1411   KETONESUR NEGATIVE 05/05/2023 1411   PROTEINUR NEGATIVE 05/05/2023 1411   NITRITE NEGATIVE 05/05/2023 1411   LEUKOCYTESUR NEGATIVE 05/05/2023 1411    Sepsis Labs: Lactic Acid, Venous No results found for: "LATICACIDVEN"  MICROBIOLOGY: Recent Results (from the past 240 hour(s))  SARS Coronavirus 2 by RT PCR (hospital order, performed in [facility] hospital lab) *cepheid single result test* Anterior Nasal Swab     Status: None   Collection Time: 05/05/23  1:01 PM   Specimen: Anterior Nasal Swab  Result Value Ref Range Status  SARS Coronavirus 2 by RT PCR NEGATIVE NEGATIVE Final    Comment: (NOTE) SARS-CoV-2 target nucleic acids are NOT DETECTED.  The SARS-CoV-2 RNA is generally detectable in upper and lower respiratory specimens during the acute phase of infection. The lowest concentration of SARS-CoV-2 viral copies this assay can detect is 250 copies / mL. A negative result does not preclude SARS-CoV-2 infection and should not be used as the sole basis for treatment or other patient management decisions.  A negative result may occur with improper specimen collection / handling, submission of specimen other than nasopharyngeal swab, presence of viral mutation(s) within the areas targeted by this assay, and inadequate number of viral copies (<250 copies / mL). A negative result must be combined with clinical observations, patient history, and epidemiological information.  Fact Sheet for Patients:   RoadLapTop.co.za  Fact Sheet for Healthcare Providers: http://kim-miller.com/  This test is not yet approved or  cleared by the [place] FDA and has been authorized for detection and/or diagnosis of SARS-CoV-2 by FDA under an Emergency Use Authorization (EUA).  This EUA will remain in effect (meaning this test can be used)  for the duration of the COVID-19 declaration under Section 564(b)(1) of the Act, 21 U.S.C. section 360bbb-3(b)(1), unless the authorization is terminated or revoked sooner.  Performed at [facility], [address] [place], [place] [address]     RADIOLOGY STUDIES/RESULTS: No results found.   LOS: 4 days   Jeoffrey Massed, MD  [facility]    To contact the attending provider between 7A-7P or the covering provider during after hours 7P-7A, please log into the web site [email or web address] and access using universal Jenkins password for that web site. If you do not have the password, please call the hospital operator.  05/09/2023, 11:07 AM

## 2023-05-09 NOTE — Plan of Care (Signed)
Pt rested quietly throughout the night with no distress noted. Alert and oriented. Up to the BR with wife assist. Sinus rhythm on the monitor. Wife stayed the night to help pt. No complaints voiced.     Problem: Education: Goal: Knowledge of General Education information will improve Description: Including pain rating scale, medication(s)/side effects and non-pharmacologic comfort measures Outcome: Progressing   Problem: Health Behavior/Discharge Planning: Goal: Ability to manage health-related needs will improve Outcome: Progressing   Problem: Clinical Measurements: Goal: Ability to maintain clinical measurements within normal limits will improve Outcome: Progressing Goal: Will remain free from infection Outcome: Progressing Goal: Diagnostic test results will improve Outcome: Progressing Goal: Respiratory complications will improve Outcome: Progressing Goal: Cardiovascular complication will be avoided Outcome: Progressing   Problem: Activity: Goal: Risk for activity intolerance will decrease Outcome: Progressing   Problem: Nutrition: Goal: Adequate nutrition will be maintained Outcome: Progressing   Problem: Coping: Goal: Level of anxiety will decrease Outcome: Progressing   Problem: Elimination: Goal: Will not experience complications related to bowel motility Outcome: Progressing Goal: Will not experience complications related to urinary retention Outcome: Progressing   Problem: Pain Managment: Goal: General experience of comfort will improve Outcome: Progressing   Problem: Safety: Goal: Ability to remain free from injury will improve Outcome: Progressing   Problem: Skin Integrity: Goal: Risk for impaired skin integrity will decrease Outcome: Progressing   Problem: Education: Goal: Ability to describe self-care measures that may prevent or decrease complications (Diabetes Survival Skills Education) will improve Outcome: Progressing Goal: Individualized  Educational Video(s) Outcome: Progressing   Problem: Coping: Goal: Ability to adjust to condition or change in health will improve Outcome: Progressing   Problem: Fluid Volume: Goal: Ability to maintain a balanced intake and output will improve Outcome: Progressing   Problem: Health Behavior/Discharge Planning: Goal: Ability to identify and utilize available resources and services will improve Outcome: Progressing Goal: Ability to manage health-related needs will improve Outcome: Progressing   Problem: Metabolic: Goal: Ability to maintain appropriate glucose levels will improve Outcome: Progressing   Problem: Nutritional: Goal: Maintenance of adequate nutrition will improve Outcome: Progressing Goal: Progress toward achieving an optimal weight will improve Outcome: Progressing   Problem: Skin Integrity: Goal: Risk for impaired skin integrity will decrease Outcome: Progressing   Problem: Tissue Perfusion: Goal: Adequacy of tissue perfusion will improve Outcome: Progressing

## 2023-05-09 NOTE — Evaluation (Signed)
Occupational Therapy Evaluation Patient Details Name: Alex Church MRN: [id number] DOB: [birth year] Today's Date: 05/09/2023   History of Present Illness Pt is a 77 y/o male presenting 8/27 with worsening 4-day h/o Left sided weakness.  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  PMHx:  DM, HTN, HLD   Clinical Impression   PTA, pt fully independent with ADL and IADL until recently; over the past several months has been more forgetful and more recently needing assist with tasks. Upon eval, pt with LUE weakness and decr coordination, poor balance with L lateral lean in sitting and standing needing cues to correct and min A during gait as well as use of AD which he does not typically use. Needing min A for LB ADL and cues for multi step commands. Presenting with decreased problem solving, safety, attention, memory, and awareness in addition to physical decline outlined above. Will continue to follow. Due to significant change in functional status,  recommending intensive multidisciplinary rehabilitation >3 hours/day to optimize safety and independence in ADL.         If plan is discharge home, recommend the following: A lot of help with walking and/or transfers;A lot of help with bathing/dressing/bathroom;Assistance with cooking/housework;Direct supervision/assist for medications management;Direct supervision/assist for financial management;Assist for transportation;Help with stairs or ramp for entrance    Functional Status Assessment  Patient has had a recent decline in their functional status and demonstrates the ability to make significant improvements in function in a reasonable and predictable amount of time.  Equipment Recommendations  Other (comment) (defer)    Recommendations for Other Services Rehab consult;Speech consult (cog)     Precautions / Restrictions Precautions Precautions: Fall Restrictions Weight Bearing Restrictions: No       Mobility Bed Mobility Overal bed mobility: Needs Assistance Bed Mobility: Supine to Sit, Sit to Supine     Supine to sit: Supervision Sit to supine: Modified independent (Device/Increase time)   General bed mobility comments: supervision for safety; poor sititng balance on rise with L lateral lean    Transfers Overall transfer level: Needs assistance   Transfers: Sit to/from Stand Sit to Stand: Min assist           General transfer comment: min stability until pt fully upright in the RW      Balance Overall balance assessment: Needs assistance Sitting-balance support: Single extremity supported, Bilateral upper extremity supported, Feet supported Sitting balance-Leahy Scale: Poor Sitting balance - Comments: l lateral lean   Standing balance support: Bilateral upper extremity supported, During functional activity Standing balance-Leahy Scale: Poor Standing balance comment: min A for balance due to L listing                           ADL either performed or assessed with clinical judgement   ADL Overall ADL's : Needs assistance/impaired Eating/Feeding: Supervision/ safety;Sitting Eating/Feeding Details (indicate cue type and reason): for balnace in unsupported sititng Grooming: Contact guard assist;Standing;Oral care   Upper Body Bathing: Set up;Sitting   Lower Body Bathing: Sit to/from stand;Minimal assistance   Upper Body Dressing : Set up;Sitting   Lower Body Dressing: Minimal assistance;Sit to/from stand   Toilet Transfer: Minimal assistance;Rolling walker (2 wheels);Ambulation           Functional mobility during ADLs: Minimal assistance;Rolling walker (2 wheels) General ADL Comments: Limited by balance sitting and standing     Vision Baseline Vision/History: 1 Wears glasses;4 Cataracts Ability to See  in Adequate Light: 0 Adequate Patient Visual Report: No change from baseline Vision Assessment?: No apparent visual deficits      Perception         Praxis         Pertinent Vitals/Pain Pain Assessment Pain Assessment: No/denies pain     Extremity/Trunk Assessment Upper Extremity Assessment Upper Extremity Assessment: Right hand dominant;LUE deficits/detail LUE Deficits / Details: decr strength as compared to R. Decreased coordination with finger to nose especially once near therapist finger.   Lower Extremity Assessment Lower Extremity Assessment: Defer to PT evaluation   Cervical / Trunk Assessment Cervical / Trunk Assessment: Normal   Communication Communication Communication: No apparent difficulties   Cognition Arousal: Alert Behavior During Therapy: WFL for tasks assessed/performed Overall Cognitive Status: Impaired/Different from baseline Area of Impairment: Problem solving, Awareness, Safety/judgement, Following commands, Attention, Memory                   Current Attention Level: Sustained (easily distracted) Memory: Decreased short-term memory Following Commands: Follows one step commands consistently Safety/Judgement: Decreased awareness of safety Awareness: Emergent Problem Solving: Requires verbal cues General Comments: Pt following one step commands throughout session. Needing multimodal cues to name things with letter of alphabed in ascending order while mobilizing. Pt easily externally distracted. Fair awareness of current abilities, but does not always recognize difficulty he is experiencing (self corrects lateral lean in sitting on command, needed cues to recall how naming task went)     General Comments  VSS    Exercises     Shoulder Instructions      Home Living Family/patient expects to be discharged to:: Private residence Living Arrangements: Spouse/significant other Available Help at Discharge: Family;Available 24 hours/day Type of Home: House Home Access: Stairs to enter     Home Layout: One level     Bathroom Shower/Tub: Contractor: Standard Bathroom Accessibility: Yes   Home Equipment: Agricultural consultant (2 wheels)          Prior Functioning/Environment Prior Level of Function : Independent/Modified Independent;Driving               ADLs Comments: Independent with ADL's until recently was mowing, weedeating, driving        OT Problem List: Decreased strength;Decreased activity tolerance;Impaired balance (sitting and/or standing);Decreased coordination;Decreased cognition;Decreased safety awareness;Decreased knowledge of use of DME or AE;Impaired UE functional use      OT Treatment/Interventions: Self-care/ADL training;Therapeutic exercise;DME and/or AE instruction;Patient/family education;Balance training;Therapeutic activities;Cognitive remediation/compensation    OT Goals(Current goals can be found in the care plan section) Acute Rehab OT Goals Patient Stated Goal: get better OT Goal Formulation: With patient Time For Goal Achievement: 05/23/23 Potential to Achieve Goals: Good  OT Frequency: Min 2X/week    Co-evaluation              AM-PAC OT "6 Clicks" Daily Activity     Outcome Measure Help from another person eating meals?: A Little Help from another person taking care of personal grooming?: A Little Help from another person toileting, which includes using toliet, bedpan, or urinal?: A Little Help from another person bathing (including washing, rinsing, drying)?: A Little Help from another person to put on and taking off regular upper body clothing?: A Little Help from another person to put on and taking off regular lower body clothing?: A Little 6 Click Score: 18   End of Session Equipment Utilized During Treatment: Gait belt;Rolling walker (2 wheels) Nurse Communication: Mobility status  Activity Tolerance: Patient tolerated treatment well Patient left: in bed;with bed alarm set;with call bell/phone within reach;with family/visitor present  OT Visit Diagnosis: Unsteadiness on  feet (R26.81);Muscle weakness (generalized) (M62.81);Other symptoms and signs involving cognitive function                Time: 3151-7616 OT Time Calculation (min): 22 min Charges:  OT General Charges $OT Visit: 1 Visit OT Evaluation $OT Eval Moderate Complexity: 1 Mod  Tyler Deis, OTR/L [facility] Acute Rehabilitation Office: [phone]   Myrla Halsted 05/09/2023, 2:04 PM

## 2023-05-10 DIAGNOSIS — E782 Mixed hyperlipidemia: Secondary | ICD-10-CM | POA: Diagnosis not present

## 2023-05-10 DIAGNOSIS — E119 Type 2 diabetes mellitus without complications: Secondary | ICD-10-CM | POA: Diagnosis not present

## 2023-05-10 DIAGNOSIS — I10 Essential (primary) hypertension: Secondary | ICD-10-CM | POA: Diagnosis not present

## 2023-05-10 DIAGNOSIS — D499 Neoplasm of unspecified behavior of unspecified site: Secondary | ICD-10-CM | POA: Diagnosis not present

## 2023-05-10 LAB — GLUCOSE, CAPILLARY
Glucose-Capillary: 211 mg/dL — ABNORMAL HIGH (ref 70–99)
Glucose-Capillary: 207 mg/dL — ABNORMAL HIGH (ref 70–99)
Glucose-Capillary: 148 mg/dL — ABNORMAL HIGH (ref 70–99)
Glucose-Capillary: 161 mg/dL — ABNORMAL HIGH (ref 70–99)

## 2023-05-10 MED ORDER — INSULIN ASPART 100 UNIT/ML IJ SOLN
6.0000 [IU] | Freq: Three times a day (TID) | INTRAMUSCULAR | Status: DC
  Administered 2023-05-10 – 2023-05-11 (×6): 6 [IU] via SUBCUTANEOUS

## 2023-05-10 MED ORDER — MELATONIN 5 MG PO TABS
5.0000 mg | ORAL_TABLET | Freq: Every day | ORAL | Status: DC
  Administered 2023-05-10 – 2023-05-14 (×5): 5 mg via ORAL
  Filled 2023-05-10 (×5): qty 1

## 2023-05-10 MED ORDER — INSULIN GLARGINE-YFGN 100 UNIT/ML ~~LOC~~ SOLN
16.0000 [IU] | Freq: Every day | SUBCUTANEOUS | Status: DC
  Administered 2023-05-10 – 2023-05-15 (×6): 16 [IU] via SUBCUTANEOUS
  Filled 2023-05-10 (×7): qty 0.16

## 2023-05-10 NOTE — Progress Notes (Signed)
PROGRESS NOTE        PATIENT DETAILS Name: Alex Church Age: 77 y.o. Sex: male Date of Birth: 1945-10-10 Admit Date: 05/05/2023 Admitting Physician Azucena Fallen, MD PCP:Paz, Nolon Rod, MD  Brief Summary: Patient is a 77 y.o.  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 8/28>> CT abdomen/pelvis: No evidence of primary neoplasm in chest/abdomen/pelvis.  Significant microbiology data: 8/27>> COVID PCR: Negative  Procedures: None  Consults: Neurosurgery Neuro-oncology  Subjective: No complaints.  Sugars remain on the higher side  Objective: Vitals: Blood pressure 128/77, pulse 68, temperature (!) 97.1 F (36.2 C), temperature source Oral, resp. rate 18, height 6' (1.829 m), weight 89.8 kg, SpO2 98%.   Exam: Awake/alert Chest: Clear to auscultation Nonfocal exam  Pertinent Labs/Radiology:    Latest Ref Rng & Units 05/09/2023    4:08 AM 05/06/2023    9:02 AM 05/05/2023    1:01 PM  CBC  WBC 4.0 - 10.5 K/uL 14.7  9.7  9.1   Hemoglobin 13.0 - 17.0 g/dL 72.5  36.6  44.0   Hematocrit 39.0 - 52.0 % 38.1  37.2  38.6   Platelets 150 - 400 K/uL 198  198  202     Lab Results  Component Value Date   NA 132 (L) 05/09/2023   K 4.0 05/09/2023   CL 101 05/09/2023   CO2 23 05/09/2023      Assessment/Plan: 7 cm right frontal brain mass with mass effect/edema Suspicion for high-grade glioma After extensive discussion with neurosurgery and  then with neuro-oncology-patient has now decided to proceed with craniotomy/excision of the brain mass-this is tentatively scheduled for this coming Tuesday In the interim-continue Decadron PT/OT eval-mobilize as much as possible.  Acute metabolic encephalopathy Likely secondary to above Relatively awake and alert-Per family/patient this is a very subtle finding. TSH/B12/ammonia-stable  DM-2 (A1c 6.9 on 5/13) with uncontrolled hyperglycemia due to steroids CBGs still uncontrolled Changed to diabetic diet Semglee increased to 16 units Increase NovoLog to 6 units-premeals Resistant SSI scale Reassess 9/2.    Recent Labs    05/09/23 1654 05/09/23 2109 05/10/23 0817  GLUCAP 223* 353* 211*    HTN BP stable Clonidine/Coreg/lisinopril Follow/optimize  HLD Lipitor  Hypomagnesemia Repleted  BMI: Estimated body mass index is 26.85 kg/m as calculated from the following:   Height as of this encounter: 6' (1.829 m).   Weight as of this encounter: 89.8 kg.   Code status:   Code Status: Full Code   DVT Prophylaxis: heparin injection 5,000 Units Start: 05/08/23 2200 SCDs Start: 05/05/23 2028   Family Communication: Spouse at bedside   Disposition Plan: Status is: Inpatient Remains inpatient appropriate because: Severity of illness   Planned Discharge Destination:Home   Diet: Diet Order             Diet heart healthy/carb modified Fluid consistency: Thin  Diet effective now  Antimicrobial agents: Anti-infectives (From admission, onward)    None        MEDICATIONS: Scheduled Meds:  atorvastatin  10 mg Oral QHS   carvedilol  12.5 mg Oral BID WC   cloNIDine  0.2 mg Oral BID   dexamethasone (DECADRON) injection  4 mg Intravenous Q6H   famotidine  20 mg Oral BID   heparin injection (subcutaneous)  5,000 Units Subcutaneous Q8H   insulin aspart  0-20 Units Subcutaneous TID WC   insulin aspart  0-5 Units Subcutaneous QHS   insulin  aspart  6 Units Subcutaneous TID WC   insulin glargine-yfgn  16 Units Subcutaneous Daily   lisinopril  20 mg Oral BID   Continuous Infusions: PRN Meds:.acetaminophen **OR** acetaminophen, alum & mag hydroxide-simeth, melatonin, ondansetron (ZOFRAN) IV, mouth rinse   I have personally reviewed following labs and imaging studies  LABORATORY DATA: CBC: Recent Labs  Lab 05/05/23 1301 05/06/23 0902 05/09/23 0408  WBC 9.1 9.7 14.7*  NEUTROABS 6.6 8.8*  --   HGB 12.5* 12.5* 12.5*  HCT 38.6* 37.2* 38.1*  MCV 93.0 92.5 92.3  PLT 202 198 198    Basic Metabolic Panel: Recent Labs  Lab 05/05/23 1301 05/06/23 0902 05/06/23 0903 05/07/23 0348 05/09/23 0408  NA 136 135  --  133* 132*  K 4.3 4.4  --  4.1 4.0  CL 101 100  --  102 101  CO2 25 22  --  23 23  GLUCOSE 148* 277*  --  224* 260*  BUN 18 17  --  19 32*  CREATININE 1.20 1.17  --  1.37* 1.18  CALCIUM 9.1 9.0  --  8.6* 8.6*  MG  --  1.1* 1.1* 1.8  --     GFR: Estimated Creatinine Clearance: 58.5 mL/min (by C-G formula based on SCr of 1.18 mg/dL).  Liver Function Tests: Recent Labs  Lab 05/05/23 1301 05/06/23 0902  AST 18 18  ALT 15 17  ALKPHOS 62 70  BILITOT 0.5 0.7  PROT 7.2 6.6  ALBUMIN 3.8 3.4*   No results for input(s): "LIPASE", "AMYLASE" in the last 168 hours. Recent Labs  Lab 05/06/23 0902  AMMONIA 26    Coagulation Profile: Recent Labs  Lab 05/06/23 0902  INR 1.1    Cardiac Enzymes: Recent Labs  Lab 05/06/23 0902  CKTOTAL 74    BNP (last 3 results) No results for input(s): "PROBNP" in the last 8760 hours.  Lipid Profile: No results for input(s): "CHOL", "HDL", "LDLCALC", "TRIG", "CHOLHDL", "LDLDIRECT" in the last 72 hours.  Thyroid Function Tests: No results for input(s): "TSH", "T4TOTAL", "FREET4", "T3FREE", "THYROIDAB" in the last 72 hours.   Anemia Panel: No results for input(s): "VITAMINB12", "FOLATE", "FERRITIN", "TIBC", "IRON", "RETICCTPCT" in the last 72 hours.   Urine  analysis:    Component Value Date/Time   COLORURINE YELLOW 05/05/2023 1411   APPEARANCEUR CLEAR 05/05/2023 1411   LABSPEC 1.020 05/05/2023 1411   PHURINE 5.0 05/05/2023 1411   GLUCOSEU NEGATIVE 05/05/2023 1411   HGBUR NEGATIVE 05/05/2023 1411   BILIRUBINUR NEGATIVE 05/05/2023 1411   KETONESUR NEGATIVE 05/05/2023 1411   PROTEINUR NEGATIVE 05/05/2023 1411   NITRITE NEGATIVE 05/05/2023 1411   LEUKOCYTESUR NEGATIVE 05/05/2023 1411    Sepsis Labs: Lactic Acid, Venous No results found for: "LATICACIDVEN"  MICROBIOLOGY: Recent Results (from the past 240 hour(s))  SARS Coronavirus 2 by RT PCR (hospital order, performed in [facility] hospital lab) *cepheid single result test* Anterior Nasal Swab     Status: None  Collection Time: 05/05/23  1:01 PM   Specimen: Anterior Nasal Swab  Result Value Ref Range Status   SARS Coronavirus 2 by RT PCR NEGATIVE NEGATIVE Final    Comment: (NOTE) SARS-CoV-2 target nucleic acids are NOT DETECTED.  The SARS-CoV-2 RNA is generally detectable in upper and lower respiratory specimens during the acute phase of infection. The lowest concentration of SARS-CoV-2 viral copies this assay can detect is 250 copies / mL. A negative result does not preclude SARS-CoV-2 infection and should not be used as the sole basis for treatment or other patient management decisions.  A negative result may occur with improper specimen collection / handling, submission of specimen other than nasopharyngeal swab, presence of viral mutation(s) within the areas targeted by this assay, and inadequate number of viral copies (<250 copies / mL). A negative result must be combined with clinical observations, patient history, and epidemiological information.  Fact Sheet for Patients:   RoadLapTop.co.za  Fact Sheet for Healthcare Providers: http://kim-miller.com/  This test is not yet approved or  cleared by the [place] FDA  and has been authorized for detection and/or diagnosis of SARS-CoV-2 by FDA under an Emergency Use Authorization (EUA).  This EUA will remain in effect (meaning this test can be used) for the duration of the COVID-19 declaration under Section 564(b)(1) of the Act, 21 U.S.C. section 360bbb-3(b)(1), unless the authorization is terminated or revoked sooner.  Performed at [facility], [address] [place], [place] [address]     RADIOLOGY STUDIES/RESULTS: No results found.   LOS: 5 days   Jeoffrey Massed, MD  [facility]    To contact the attending provider between 7A-7P or the covering provider during after hours 7P-7A, please log into the web site [email or web address] and access using universal [place] password for that web site. If you do not have the password, please call the hospital operator.  05/10/2023, 9:42 AM

## 2023-05-10 NOTE — Plan of Care (Signed)

## 2023-05-10 NOTE — Plan of Care (Signed)
Pt has rested quietly throughout the night with no distress noted. Alert and oriented. Wife at bedside. Pt up with assist to BR. No c/o voiced. Received insulin for bedtime glucose.     Problem: Education: Goal: Knowledge of General Education information will improve Description: Including pain rating scale, medication(s)/side effects and non-pharmacologic comfort measures Outcome: Progressing   Problem: Health Behavior/Discharge Planning: Goal: Ability to manage health-related needs will improve Outcome: Progressing   Problem: Clinical Measurements: Goal: Ability to maintain clinical measurements within normal limits will improve Outcome: Progressing Goal: Will remain free from infection Outcome: Progressing Goal: Diagnostic test results will improve Outcome: Progressing Goal: Respiratory complications will improve Outcome: Progressing Goal: Cardiovascular complication will be avoided Outcome: Progressing   Problem: Activity: Goal: Risk for activity intolerance will decrease Outcome: Progressing   Problem: Nutrition: Goal: Adequate nutrition will be maintained Outcome: Progressing   Problem: Coping: Goal: Level of anxiety will decrease Outcome: Progressing   Problem: Elimination: Goal: Will not experience complications related to bowel motility Outcome: Progressing Goal: Will not experience complications related to urinary retention Outcome: Progressing   Problem: Pain Managment: Goal: General experience of comfort will improve Outcome: Progressing   Problem: Safety: Goal: Ability to remain free from injury will improve Outcome: Progressing   Problem: Skin Integrity: Goal: Risk for impaired skin integrity will decrease Outcome: Progressing   Problem: Education: Goal: Ability to describe self-care measures that may prevent or decrease complications (Diabetes Survival Skills Education) will improve Outcome: Progressing Goal: Individualized Educational  Video(s) Outcome: Progressing   Problem: Coping: Goal: Ability to adjust to condition or change in health will improve Outcome: Progressing   Problem: Fluid Volume: Goal: Ability to maintain a balanced intake and output will improve Outcome: Progressing   Problem: Health Behavior/Discharge Planning: Goal: Ability to identify and utilize available resources and services will improve Outcome: Progressing Goal: Ability to manage health-related needs will improve Outcome: Progressing   Problem: Metabolic: Goal: Ability to maintain appropriate glucose levels will improve Outcome: Progressing   Problem: Nutritional: Goal: Maintenance of adequate nutrition will improve Outcome: Progressing Goal: Progress toward achieving an optimal weight will improve Outcome: Progressing   Problem: Skin Integrity: Goal: Risk for impaired skin integrity will decrease Outcome: Progressing   Problem: Tissue Perfusion: Goal: Adequacy of tissue perfusion will improve Outcome: Progressing

## 2023-05-11 DIAGNOSIS — D499 Neoplasm of unspecified behavior of unspecified site: Secondary | ICD-10-CM | POA: Diagnosis not present

## 2023-05-11 DIAGNOSIS — E119 Type 2 diabetes mellitus without complications: Secondary | ICD-10-CM | POA: Diagnosis not present

## 2023-05-11 DIAGNOSIS — I10 Essential (primary) hypertension: Secondary | ICD-10-CM | POA: Diagnosis not present

## 2023-05-11 DIAGNOSIS — E782 Mixed hyperlipidemia: Secondary | ICD-10-CM | POA: Diagnosis not present

## 2023-05-11 LAB — COMPREHENSIVE METABOLIC PANEL
ALT: 73 U/L — ABNORMAL HIGH (ref 0–44)
AST: 33 U/L (ref 15–41)
Albumin: 2.7 g/dL — ABNORMAL LOW (ref 3.5–5.0)
Alkaline Phosphatase: 51 U/L (ref 38–126)
Anion gap: 7 (ref 5–15)
BUN: 39 mg/dL — ABNORMAL HIGH (ref 8–23)
CO2: 20 mmol/L — ABNORMAL LOW (ref 22–32)
Calcium: 8.4 mg/dL — ABNORMAL LOW (ref 8.9–10.3)
Chloride: 104 mmol/L (ref 98–111)
Creatinine, Ser: 1.23 mg/dL (ref 0.61–1.24)
GFR, Estimated: >60 mL/min (ref >60)
Glucose, Bld: 180 mg/dL — ABNORMAL HIGH (ref 70–99)
Potassium: 4.2 mmol/L (ref 3.5–5.1)
Sodium: 131 mmol/L — ABNORMAL LOW (ref 135–145)
Total Bilirubin: 0.7 mg/dL (ref 0.3–1.2)
Total Protein: 5.5 g/dL — ABNORMAL LOW (ref 6.5–8.1)

## 2023-05-11 LAB — CBC
HCT: 38.3 % — ABNORMAL LOW (ref 39.0–52.0)
Hemoglobin: 12.5 g/dL — ABNORMAL LOW (ref 13.0–17.0)
MCH: 29.7 pg (ref 26.0–34.0)
MCHC: 32.6 g/dL (ref 30.0–36.0)
MCV: 91 fL (ref 80.0–100.0)
Platelets: 197 10*3/uL (ref 150–400)
RBC: 4.21 MIL/uL — ABNORMAL LOW (ref 4.22–5.81)
RDW: 14.4 % (ref 11.5–15.5)
WBC: 14.7 10*3/uL — ABNORMAL HIGH (ref 4.0–10.5)
nRBC: 0 % (ref 0.0–0.2)

## 2023-05-11 LAB — GLUCOSE, CAPILLARY
Glucose-Capillary: 195 mg/dL — ABNORMAL HIGH (ref 70–99)
Glucose-Capillary: 203 mg/dL — ABNORMAL HIGH (ref 70–99)
Glucose-Capillary: 113 mg/dL — ABNORMAL HIGH (ref 70–99)
Glucose-Capillary: 216 mg/dL — ABNORMAL HIGH (ref 70–99)

## 2023-05-11 NOTE — Plan of Care (Signed)

## 2023-05-11 NOTE — Progress Notes (Signed)
PROGRESS NOTE        PATIENT DETAILS Name: Alex Church Age: 77 y.o. Sex: male Date of Birth: July 04, 1946 Admit Date: 05/05/2023 Admitting Physician Azucena Fallen, MD PCP:Paz, Nolon Rod, MD  Brief Summary: Patient is a 77 y.o.  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 8/28>> CT abdomen/pelvis: No evidence of primary neoplasm in chest/abdomen/pelvis.  Significant microbiology data: 8/27>> COVID PCR: Negative  Procedures: None  Consults: Neurosurgery Neuro-oncology  Subjective: No complaints overnight.  Objective: Vitals: Blood pressure (!) 140/77, pulse 60, temperature 97.6 F (36.4 C), temperature source Oral, resp. rate 18, height 6' (1.829 m), weight 90.2 kg, SpO2 98%.   Exam: Awake/alert Chest: Clear to auscultation Nonfocal exam  Pertinent Labs/Radiology:    Latest Ref Rng & Units 05/11/2023    3:05 AM 05/09/2023    4:08 AM 05/06/2023    9:02 AM  CBC  WBC 4.0 - 10.5 K/uL 14.7  14.7  9.7   Hemoglobin 13.0 - 17.0 g/dL 95.1  88.4  16.6   Hematocrit 39.0 - 52.0 % 38.3  38.1  37.2   Platelets 150 - 400 K/uL 197  198  198     Lab Results  Component Value Date   NA 131 (L) 05/11/2023   K 4.2 05/11/2023   CL 104 05/11/2023   CO2 20 (L) 05/11/2023      Assessment/Plan: 7 cm right frontal brain mass with mass effect/edema Suspicion for high-grade glioma After extensive discussion with neurosurgery and then with  neuro-oncology-patient has now decided to proceed with craniotomy/excision of the brain mass-this has not been scheduled for 9/3.  N.p.o. postmidnight In the interim-continue Decadron PT/OT eval-mobilize as much as possible.  Acute metabolic encephalopathy Likely secondary to above Relatively awake and alert-Per family/patient this is a very subtle finding. TSH/B12/ammonia-stable  DM-2 (A1c 6.9 on 5/13) with uncontrolled hyperglycemia due to steroids CBGs better controlled Semglee 16 units, Premeal NovoLog 6 units and SSI   Recent Labs    05/10/23 1625 05/10/23 2118 05/11/23 0752  GLUCAP 148* 161* 195*    HTN BP stable Clonidine/Coreg/lisinopril Follow/optimize  HLD Lipitor  Hypomagnesemia Repleted  BMI: Estimated body mass index is 26.97 kg/m as calculated from the following:   Height as of this encounter: 6' (1.829 m).   Weight as of this encounter: 90.2 kg.   Code status:   Code Status: Full Code   DVT Prophylaxis: heparin injection 5,000 Units Start: 05/08/23 2200 SCDs Start: 05/05/23 2028   Family Communication: Spouse at bedside   Disposition Plan: Status is: Inpatient Remains inpatient appropriate because: Severity of illness   Planned Discharge Destination:Home   Diet: Diet Order             Diet NPO time specified  Diet effective midnight           Diet heart healthy/carb modified Fluid consistency: Thin  Diet effective now  Antimicrobial agents: Anti-infectives (From admission, onward)    None        MEDICATIONS: Scheduled Meds:  atorvastatin  10 mg Oral QHS   carvedilol  12.5 mg Oral BID WC   cloNIDine  0.2 mg Oral BID   dexamethasone (DECADRON) injection  4 mg Intravenous Q6H   famotidine  20 mg Oral BID   heparin injection (subcutaneous)  5,000 Units Subcutaneous Q8H   insulin aspart  0-20 Units Subcutaneous TID WC   insulin aspart  0-5 Units Subcutaneous QHS   insulin aspart  6 Units Subcutaneous  TID WC   insulin glargine-yfgn  16 Units Subcutaneous Daily   lisinopril  20 mg Oral BID   melatonin  5 mg Oral QHS   Continuous Infusions: PRN Meds:.acetaminophen **OR** acetaminophen, alum & mag hydroxide-simeth, ondansetron (ZOFRAN) IV, mouth rinse   I have personally reviewed following labs and imaging studies  LABORATORY DATA: CBC: Recent Labs  Lab 05/05/23 1301 05/06/23 0902 05/09/23 0408 05/11/23 0305  WBC 9.1 9.7 14.7* 14.7*  NEUTROABS 6.6 8.8*  --   --   HGB 12.5* 12.5* 12.5* 12.5*  HCT 38.6* 37.2* 38.1* 38.3*  MCV 93.0 92.5 92.3 91.0  PLT 202 198 198 197    Basic Metabolic Panel: Recent Labs  Lab 05/05/23 1301 05/06/23 0902 05/06/23 0903 05/07/23 0348 05/09/23 0408 05/11/23 0305  NA 136 135  --  133* 132* 131*  K 4.3 4.4  --  4.1 4.0 4.2  CL 101 100  --  102 101 104  CO2 25 22  --  23 23 20*  GLUCOSE 148* 277*  --  224* 260* 180*  BUN 18 17  --  19 32* 39*  CREATININE 1.20 1.17  --  1.37* 1.18 1.23  CALCIUM 9.1 9.0  --  8.6* 8.6* 8.4*  MG  --  1.1* 1.1* 1.8  --   --     GFR: Estimated Creatinine Clearance: 56.1 mL/min (by C-G formula based on SCr of 1.23 mg/dL).  Liver Function Tests: Recent Labs  Lab 05/05/23 1301 05/06/23 0902 05/11/23 0305  AST 18 18 33  ALT 15 17 73*  ALKPHOS 62 70 51  BILITOT 0.5 0.7 0.7  PROT 7.2 6.6 5.5*  ALBUMIN 3.8 3.4* 2.7*   No results for input(s): "LIPASE", "AMYLASE" in the last 168 hours. Recent Labs  Lab 05/06/23 0902  AMMONIA 26    Coagulation Profile: Recent Labs  Lab 05/06/23 0902  INR 1.1    Cardiac Enzymes: Recent Labs  Lab 05/06/23 0902  CKTOTAL 74    BNP (last 3 results) No results for input(s): "PROBNP" in the last 8760 hours.  Lipid Profile: No results for input(s): "CHOL", "HDL", "LDLCALC", "TRIG", "CHOLHDL", "LDLDIRECT" in the last 72 hours.  Thyroid Function Tests: No results for input(s): "TSH", "T4TOTAL", "FREET4", "T3FREE", "THYROIDAB" in the last 72 hours.   Anemia  Panel: No results for input(s): "VITAMINB12", "FOLATE", "FERRITIN", "TIBC", "IRON", "RETICCTPCT" in the last 72 hours.   Urine analysis:    Component Value Date/Time   COLORURINE YELLOW 05/05/2023 1411   APPEARANCEUR CLEAR 05/05/2023 1411   LABSPEC 1.020 05/05/2023 1411   PHURINE 5.0 05/05/2023 1411   GLUCOSEU NEGATIVE 05/05/2023 1411   HGBUR NEGATIVE 05/05/2023 1411   BILIRUBINUR NEGATIVE 05/05/2023 1411   KETONESUR NEGATIVE 05/05/2023 1411   PROTEINUR NEGATIVE 05/05/2023 1411   NITRITE NEGATIVE 05/05/2023 1411   LEUKOCYTESUR NEGATIVE 05/05/2023 1411    Sepsis Labs: Lactic Acid, Venous No results found for: "  LATICACIDVEN"  MICROBIOLOGY: Recent Results (from the past 240 hour(s))  SARS Coronavirus 2 by RT PCR (hospital order, performed in [facility] hospital lab) *cepheid single result test* Anterior Nasal Swab     Status: None   Collection Time: 05/05/23  1:01 PM   Specimen: Anterior Nasal Swab  Result Value Ref Range Status   SARS Coronavirus 2 by RT PCR NEGATIVE NEGATIVE Final    Comment: (NOTE) SARS-CoV-2 target nucleic acids are NOT DETECTED.  The SARS-CoV-2 RNA is generally detectable in upper and lower respiratory specimens during the acute phase of infection. The lowest concentration of SARS-CoV-2 viral copies this assay can detect is 250 copies / mL. A negative result does not preclude SARS-CoV-2 infection and should not be used as the sole basis for treatment or other patient management decisions.  A negative result may occur with improper specimen collection / handling, submission of specimen other than nasopharyngeal swab, presence of viral mutation(s) within the areas targeted by this assay, and inadequate number of viral copies (<250 copies / mL). A negative result must be combined with clinical observations, patient history, and epidemiological information.  Fact Sheet for Patients:   RoadLapTop.co.za  Fact Sheet for Healthcare  Providers: http://kim-miller.com/  This test is not yet approved or  cleared by the [place] FDA and has been authorized for detection and/or diagnosis of SARS-CoV-2 by FDA under an Emergency Use Authorization (EUA).  This EUA will remain in effect (meaning this test can be used) for the duration of the COVID-19 declaration under Section 564(b)(1) of the Act, 21 U.S.C. section 360bbb-3(b)(1), unless the authorization is terminated or revoked sooner.  Performed at [facility], [address] [place], [place] [address]     RADIOLOGY STUDIES/RESULTS: No results found.   LOS: 6 days   Jeoffrey Massed, MD  [facility]    To contact the attending provider between 7A-7P or the covering provider during after hours 7P-7A, please log into the web site [email or web address] and access using universal [place] password for that web site. If you do not have the password, please call the hospital operator.  05/11/2023, 9:37 AM

## 2023-05-11 NOTE — Progress Notes (Signed)
Physical Therapy Treatment Patient Details Name: Alex Church MRN: [id number] DOB: Jan 18, 1946 Today's Date: 05/11/2023   History of Present Illness Pt is a 77 y/o male presenting 8/27 with worsening 4-day h/o Left sided weakness. 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 Now plan for Right steriostatic frontal craniotomy for tumor resection on 9/3. PMHx:  DM, HTN, HLD.    PT Comments  Pt received up in hallway with niece present, pt using RW and with improved stability from evaluation, pt agreeable to therapy session for further gait training with and without AD and higher level balance assessment. Pt performed portions of DGI, scoring 15/18 of the portions he performed, but he defers stair portion and obstacle portion due to fatigue. Pt needing up to CGA for safety with gait/transfers this session without AD. Pt and family agreeable to continue using RW for safety with functional tasks when not working with therapies. Pt will need PT re-eval next session given plan for R craniotomy/tumor resection next date. Pt continues to benefit from PT services to progress toward functional mobility goals.    If plan is discharge home, recommend the following: A little help with walking and/or transfers;Two people to help with bathing/dressing/bathroom   Can travel by private vehicle        Equipment Recommendations  Rolling walker (2 wheels)    Recommendations for Other Services       Precautions / Restrictions Precautions Precautions: Fall Restrictions Weight Bearing Restrictions: No     Mobility  Bed Mobility Overal bed mobility: Needs Assistance             General bed mobility comments: pt received up in hallway with niece.    Transfers Overall transfer level: Needs assistance Equipment used: None Transfers: Sit to/from Stand Sit to Stand: Contact guard assist           General transfer comment: CGA for stability/safety with stand>sit to  cushioned chair, fair eccentric control and pt reaching for arm rests.    Ambulation/Gait Ambulation/Gait assistance: Contact guard assist Gait Distance (Feet): 300 Feet (+~276ft with niece prior to PTA arrival) Assistive device: Rolling walker (2 wheels), None Gait Pattern/deviations: Step-through pattern, Narrow base of support       General Gait Details: Mild unsteadiness without AD but no buckling or overt LOB, pt with narrow BOS and some steps near to crossing midline, but no scissoring. Min cues for wider BOS, environmental navigation. Pt performs head turns, 180* turns, step over box and speed changes without significant difficulty. Pt defers stair trial due to fatigue so did not perform full DGI due to this.   Stairs Stairs:  (pt defers)           Wheelchair Mobility     Tilt Bed    Modified Rankin (Stroke Patients Only)       Balance Overall balance assessment: Needs assistance Sitting-balance support: Single extremity supported, Bilateral upper extremity supported, Feet supported Sitting balance-Leahy Scale: Fair Sitting balance - Comments: no lean sitting up in chair, limited assessment as pt already standing when PTA arrived   Standing balance support: During functional activity, No upper extremity supported Standing balance-Leahy Scale: Fair Standing balance comment: no significant LOB but pt appears more confident using RW, rec continuing RW when walking room/hallway with family assist.                            Cognition Arousal: Alert  Behavior During Therapy: WFL for tasks assessed/performed Overall Cognitive Status: Impaired/Different from baseline Area of Impairment: Problem solving, Awareness, Safety/judgement, Following commands, Attention, Memory                   Current Attention Level: Sustained (easily distracted) Memory: Decreased short-term memory Following Commands: Follows one step commands  consistently Safety/Judgement: Decreased awareness of safety Awareness: Emergent Problem Solving: Requires verbal cues General Comments: Pt following one step commands throughout session. Pt easily externally distracted. Fair awareness of current abilities, pt eager to progress/maintain strength and return to independence. Some multimodal cues for unfamiliar task/for carryover of instruction with LE exercise technique and timing of reps.        Exercises General Exercises - Lower Extremity Ankle Circles/Pumps: AROM, Both, 5 reps, Seated Long Arc Quad: AROM, Both, 5 reps, Seated Hip Flexion/Marching: AROM, Both, 10 reps, Seated    General Comments General comments (skin integrity, edema, etc.): HR WFL per tele monitor, no DOE or c/o lightheadedness      Pertinent Vitals/Pain Pain Assessment Pain Assessment: No/denies pain     PT Goals (current goals can now be found in the care plan section) Acute Rehab PT Goals Patient Stated Goal: Back to independence after surgery PT Goal Formulation: With patient Time For Goal Achievement: 05/22/23 Progress towards PT goals: Progressing toward goals    Frequency    Min 1X/week      PT Plan         AM-PAC PT "6 Clicks" Mobility   Outcome Measure  Help needed turning from your back to your side while in a flat bed without using bedrails?: None Help needed moving from lying on your back to sitting on the side of a flat bed without using bedrails?: A Little Help needed moving to and from a bed to a chair (including a wheelchair)?: A Little Help needed standing up from a chair using your arms (e.g., wheelchair or bedside chair)?: A Little Help needed to walk in hospital room?: A Little Help needed climbing 3-5 steps with a railing? : A Little 6 Click Score: 19    End of Session Equipment Utilized During Treatment: Gait belt Activity Tolerance: Patient tolerated treatment well Patient left: in chair;with call bell/phone within  reach;with family/visitor present;Other (comment) (niece and x2 other family members in his room) Nurse Communication: Mobility status PT Visit Diagnosis: Unsteadiness on feet (R26.81);Other symptoms and signs involving the nervous system (R29.898)     Time: 1610-9604 PT Time Calculation (min) (ACUTE ONLY): 9 min  Charges:    $Gait Training: 8-22 mins PT General Charges $$ ACUTE PT VISIT: 1 Visit                     Daquana Paddock P., PTA Acute Rehabilitation Services Secure Chat Preferred 9a-5:30pm Office: [phone]    Dorathy Kinsman [facility] 05/11/2023, 12:13 PM

## 2023-05-12 ENCOUNTER — Other Ambulatory Visit: Payer: Self-pay

## 2023-05-12 ENCOUNTER — Inpatient Hospital Stay (HOSPITAL_COMMUNITY)
Admission: EM | Disposition: A | Discharge status: Rehabilitation Facility | Payer: Self-pay | Source: Home / Self Care | Attending: Internal Medicine

## 2023-05-12 ENCOUNTER — Encounter (HOSPITAL_COMMUNITY): Payer: Self-pay | Admitting: Internal Medicine

## 2023-05-12 ENCOUNTER — Inpatient Hospital Stay (HOSPITAL_COMMUNITY): Payer: Medicare Other

## 2023-05-12 ENCOUNTER — Inpatient Hospital Stay (HOSPITAL_COMMUNITY): Payer: Self-pay

## 2023-05-12 DIAGNOSIS — G935 Compression of brain: Secondary | ICD-10-CM | POA: Diagnosis not present

## 2023-05-12 DIAGNOSIS — D496 Neoplasm of unspecified behavior of brain: Secondary | ICD-10-CM

## 2023-05-12 DIAGNOSIS — Z7984 Long term (current) use of oral hypoglycemic drugs: Secondary | ICD-10-CM

## 2023-05-12 DIAGNOSIS — E119 Type 2 diabetes mellitus without complications: Secondary | ICD-10-CM

## 2023-05-12 DIAGNOSIS — I10 Essential (primary) hypertension: Secondary | ICD-10-CM

## 2023-05-12 DIAGNOSIS — D499 Neoplasm of unspecified behavior of unspecified site: Secondary | ICD-10-CM | POA: Diagnosis not present

## 2023-05-12 DIAGNOSIS — E782 Mixed hyperlipidemia: Secondary | ICD-10-CM | POA: Diagnosis not present

## 2023-05-12 HISTORY — PX: CRANIOTOMY: SHX93

## 2023-05-12 HISTORY — PX: APPLICATION OF CRANIAL NAVIGATION: SHX6578

## 2023-05-12 LAB — GLUCOSE, CAPILLARY
Glucose-Capillary: 131 mg/dL — ABNORMAL HIGH (ref 70–99)
Glucose-Capillary: 176 mg/dL — ABNORMAL HIGH (ref 70–99)
Glucose-Capillary: 180 mg/dL — ABNORMAL HIGH (ref 70–99)
Glucose-Capillary: 197 mg/dL — ABNORMAL HIGH (ref 70–99)
Glucose-Capillary: 202 mg/dL — ABNORMAL HIGH (ref 70–99)

## 2023-05-12 LAB — BASIC METABOLIC PANEL
Anion gap: 11 (ref 5–15)
BUN: 38 mg/dL — ABNORMAL HIGH (ref 8–23)
CO2: 20 mmol/L — ABNORMAL LOW (ref 22–32)
Calcium: 8 mg/dL — ABNORMAL LOW (ref 8.9–10.3)
Chloride: 103 mmol/L (ref 98–111)
Creatinine, Ser: 1.01 mg/dL (ref 0.61–1.24)
GFR, Estimated: >60 mL/min (ref >60)
Glucose, Bld: 190 mg/dL — ABNORMAL HIGH (ref 70–99)
Potassium: 4.3 mmol/L (ref 3.5–5.1)
Sodium: 134 mmol/L — ABNORMAL LOW (ref 135–145)

## 2023-05-12 LAB — PREPARE RBC (CROSSMATCH)

## 2023-05-12 LAB — MRSA NEXT GEN BY PCR, NASAL: MRSA by PCR Next Gen: DETECTED — AB

## 2023-05-12 SURGERY — CRANIOTOMY TUMOR EXCISION
Anesthesia: General | Laterality: Right

## 2023-05-12 MED ORDER — LABETALOL HCL 5 MG/ML IV SOLN
10.0000 mg | INTRAVENOUS | Status: DC | PRN
  Administered 2023-05-13 (×2): 20 mg via INTRAVENOUS
  Filled 2023-05-12 (×2): qty 4

## 2023-05-12 MED ORDER — LIDOCAINE 2% (20 MG/ML) 5 ML SYRINGE
INTRAMUSCULAR | Status: DC | PRN
  Administered 2023-05-12: 100 mg via INTRAVENOUS

## 2023-05-12 MED ORDER — POLYETHYLENE GLYCOL 3350 17 G PO PACK: 17.0000 g | PACK | Freq: Every day | ORAL | Status: DC | PRN

## 2023-05-12 MED ORDER — MANNITOL 20 % IV SOLN: INTRAVENOUS | Status: DC | PRN

## 2023-05-12 MED ORDER — LACTATED RINGERS IV SOLN: INTRAVENOUS | Status: DC

## 2023-05-12 MED ORDER — LEVETIRACETAM IN NACL 1000 MG/100ML IV SOLN
1000.0000 mg | Freq: Once | INTRAVENOUS | Status: AC
  Administered 2023-05-12: 1000 mg via INTRAVENOUS

## 2023-05-12 MED ORDER — FLEET ENEMA RE ENEM: 1.0000 | ENEMA | Freq: Once | RECTAL | Status: DC | PRN

## 2023-05-12 MED ORDER — PHENYLEPHRINE HCL-NACL 20-0.9 MG/250ML-% IV SOLN
INTRAVENOUS | Status: DC | PRN
  Administered 2023-05-12: 20 ug/min via INTRAVENOUS

## 2023-05-12 MED ORDER — BACITRACIN ZINC 500 UNIT/GM EX OINT
TOPICAL_OINTMENT | CUTANEOUS | Status: DC | PRN
  Administered 2023-05-12: 1 via TOPICAL

## 2023-05-12 MED ORDER — CEFAZOLIN SODIUM-DEXTROSE 2-4 GM/100ML-% IV SOLN
2.0000 g | Freq: Three times a day (TID) | INTRAVENOUS | Status: AC
  Administered 2023-05-12 – 2023-05-13 (×2): 2 g via INTRAVENOUS
  Filled 2023-05-12 (×2): qty 100

## 2023-05-12 MED ORDER — HYDROCODONE-ACETAMINOPHEN 5-325 MG PO TABS
1.0000 | ORAL_TABLET | ORAL | Status: DC | PRN
  Filled 2023-05-12: qty 1

## 2023-05-12 MED ORDER — DEXAMETHASONE SODIUM PHOSPHATE 10 MG/ML IJ SOLN
INTRAMUSCULAR | Status: AC
  Filled 2023-05-12: qty 1

## 2023-05-12 MED ORDER — CEFAZOLIN SODIUM-DEXTROSE 2-4 GM/100ML-% IV SOLN
2.0000 g | INTRAVENOUS | Status: AC
  Administered 2023-05-12: 2 g via INTRAVENOUS
  Filled 2023-05-12: qty 100

## 2023-05-12 MED ORDER — CHLORHEXIDINE GLUCONATE 0.12 % MT SOLN
OROMUCOSAL | Status: AC
  Administered 2023-05-12: 15 mL via OROMUCOSAL
  Filled 2023-05-12: qty 15

## 2023-05-12 MED ORDER — BACITRACIN ZINC 500 UNIT/GM EX OINT
TOPICAL_OINTMENT | CUTANEOUS | Status: AC
  Filled 2023-05-12: qty 28.35

## 2023-05-12 MED ORDER — SUGAMMADEX SODIUM 200 MG/2ML IV SOLN
INTRAVENOUS | Status: DC | PRN
  Administered 2023-05-12: 200 mg via INTRAVENOUS

## 2023-05-12 MED ORDER — FENTANYL CITRATE (PF) 100 MCG/2ML IJ SOLN: 25.0000 ug | INTRAMUSCULAR | Status: DC | PRN

## 2023-05-12 MED ORDER — FENTANYL CITRATE (PF) 250 MCG/5ML IJ SOLN
INTRAMUSCULAR | Status: AC
  Filled 2023-05-12: qty 5

## 2023-05-12 MED ORDER — EPHEDRINE 5 MG/ML INJ
INTRAVENOUS | Status: AC
  Filled 2023-05-12: qty 5

## 2023-05-12 MED ORDER — SODIUM CHLORIDE 0.9 % IV SOLN: INTRAVENOUS | Status: DC

## 2023-05-12 MED ORDER — BUPIVACAINE-EPINEPHRINE (PF) 0.5% -1:200000 IJ SOLN
INTRAMUSCULAR | Status: DC | PRN
  Administered 2023-05-12: 7.5 mL via PERINEURAL

## 2023-05-12 MED ORDER — HYDROMORPHONE HCL 1 MG/ML IJ SOLN
0.5000 mg | INTRAMUSCULAR | Status: DC | PRN
  Administered 2023-05-13: 0.5 mg via INTRAVENOUS
  Filled 2023-05-12 (×2): qty 1

## 2023-05-12 MED ORDER — HYDRALAZINE HCL 20 MG/ML IJ SOLN
10.0000 mg | INTRAMUSCULAR | Status: DC | PRN
  Administered 2023-05-12: 20 mg via INTRAVENOUS
  Administered 2023-05-13: 40 mg via INTRAVENOUS
  Filled 2023-05-12: qty 1
  Filled 2023-05-12: qty 2

## 2023-05-12 MED ORDER — INSULIN ASPART 100 UNIT/ML IJ SOLN
0.0000 [IU] | INTRAMUSCULAR | Status: DC
  Administered 2023-05-12: 7 [IU] via SUBCUTANEOUS
  Administered 2023-05-13 (×2): 3 [IU] via SUBCUTANEOUS
  Administered 2023-05-13: 4 [IU] via SUBCUTANEOUS
  Administered 2023-05-13: 7 [IU] via SUBCUTANEOUS
  Administered 2023-05-13: 4 [IU] via SUBCUTANEOUS
  Administered 2023-05-14: 3 [IU] via SUBCUTANEOUS

## 2023-05-12 MED ORDER — PROPOFOL 10 MG/ML IV BOLUS
INTRAVENOUS | Status: AC
  Filled 2023-05-12: qty 20

## 2023-05-12 MED ORDER — PHENYLEPHRINE 80 MCG/ML (10ML) SYRINGE FOR IV PUSH (FOR BLOOD PRESSURE SUPPORT)
PREFILLED_SYRINGE | INTRAVENOUS | Status: AC
  Filled 2023-05-12: qty 10

## 2023-05-12 MED ORDER — THROMBIN 20000 UNITS EX SOLR
CUTANEOUS | Status: AC
  Filled 2023-05-12: qty 20000

## 2023-05-12 MED ORDER — ACETAMINOPHEN 10 MG/ML IV SOLN: 1000.0000 mg | Freq: Once | INTRAVENOUS | Status: DC | PRN

## 2023-05-12 MED ORDER — 0.9 % SODIUM CHLORIDE (POUR BTL) OPTIME
TOPICAL | Status: DC | PRN
  Administered 2023-05-12: 1000 mL

## 2023-05-12 MED ORDER — PANTOPRAZOLE SODIUM 40 MG IV SOLR
40.0000 mg | Freq: Every day | INTRAVENOUS | Status: DC
  Administered 2023-05-12: 40 mg via INTRAVENOUS
  Filled 2023-05-12: qty 10

## 2023-05-12 MED ORDER — CHLORHEXIDINE GLUCONATE CLOTH 2 % EX PADS
6.0000 | MEDICATED_PAD | Freq: Every day | CUTANEOUS | Status: DC
  Administered 2023-05-12 – 2023-05-14 (×4): 6 via TOPICAL

## 2023-05-12 MED ORDER — THROMBIN 20000 UNITS EX KIT
PACK | CUTANEOUS | Status: DC | PRN
  Administered 2023-05-12 (×2): 20 mL via TOPICAL

## 2023-05-12 MED ORDER — ONDANSETRON HCL 4 MG/2ML IJ SOLN: 4.0000 mg | Freq: Once | INTRAMUSCULAR | Status: DC | PRN

## 2023-05-12 MED ORDER — LIDOCAINE-EPINEPHRINE 1 %-1:100000 IJ SOLN
INTRAMUSCULAR | Status: DC | PRN
  Administered 2023-05-12: 7.5 mL via INTRADERMAL

## 2023-05-12 MED ORDER — EPHEDRINE SULFATE-NACL 50-0.9 MG/10ML-% IV SOSY
PREFILLED_SYRINGE | INTRAVENOUS | Status: DC | PRN
  Administered 2023-05-12 (×2): 5 mg via INTRAVENOUS

## 2023-05-12 MED ORDER — PHENYLEPHRINE 80 MCG/ML (10ML) SYRINGE FOR IV PUSH (FOR BLOOD PRESSURE SUPPORT)
PREFILLED_SYRINGE | INTRAVENOUS | Status: DC | PRN
  Administered 2023-05-12: 80 ug via INTRAVENOUS

## 2023-05-12 MED ORDER — BUPIVACAINE-EPINEPHRINE (PF) 0.5% -1:200000 IJ SOLN
INTRAMUSCULAR | Status: AC
  Filled 2023-05-12: qty 30

## 2023-05-12 MED ORDER — ROCURONIUM BROMIDE 10 MG/ML (PF) SYRINGE
PREFILLED_SYRINGE | INTRAVENOUS | Status: AC
  Filled 2023-05-12: qty 10

## 2023-05-12 MED ORDER — MICROFIBRILLAR COLL HEMOSTAT EX PADS
MEDICATED_PAD | CUTANEOUS | Status: DC | PRN
  Administered 2023-05-12: 1 via TOPICAL

## 2023-05-12 MED ORDER — LEVETIRACETAM IN NACL 500 MG/100ML IV SOLN
500.0000 mg | Freq: Two times a day (BID) | INTRAVENOUS | Status: DC
  Administered 2023-05-12 – 2023-05-14 (×4): 500 mg via INTRAVENOUS
  Filled 2023-05-12 (×4): qty 100

## 2023-05-12 MED ORDER — AMISULPRIDE (ANTIEMETIC) 5 MG/2ML IV SOLN: 10.0000 mg | Freq: Once | INTRAVENOUS | Status: DC | PRN

## 2023-05-12 MED ORDER — SODIUM CHLORIDE 0.9 % IV SOLN: INTRAVENOUS | Status: DC | PRN

## 2023-05-12 MED ORDER — DEXAMETHASONE SODIUM PHOSPHATE 10 MG/ML IJ SOLN
INTRAMUSCULAR | Status: DC | PRN
  Administered 2023-05-12: 4 mg via INTRAVENOUS

## 2023-05-12 MED ORDER — MUPIROCIN 2 % EX OINT
1.0000 | TOPICAL_OINTMENT | Freq: Two times a day (BID) | CUTANEOUS | Status: DC
  Administered 2023-05-12 – 2023-05-15 (×6): 1 via NASAL
  Filled 2023-05-12 (×2): qty 22

## 2023-05-12 MED ORDER — ROCURONIUM BROMIDE 10 MG/ML (PF) SYRINGE
PREFILLED_SYRINGE | INTRAVENOUS | Status: DC | PRN
  Administered 2023-05-12: 60 mg via INTRAVENOUS
  Administered 2023-05-12 (×2): 20 mg via INTRAVENOUS

## 2023-05-12 MED ORDER — LIDOCAINE 2% (20 MG/ML) 5 ML SYRINGE
INTRAMUSCULAR | Status: AC
  Filled 2023-05-12: qty 5

## 2023-05-12 MED ORDER — PROPOFOL 10 MG/ML IV BOLUS
INTRAVENOUS | Status: DC | PRN
  Administered 2023-05-12: 20 mg via INTRAVENOUS
  Administered 2023-05-12: 200 mg via INTRAVENOUS
  Administered 2023-05-12 (×2): 40 mg via INTRAVENOUS

## 2023-05-12 MED ORDER — SODIUM CHLORIDE 0.9% IV SOLUTION: Freq: Once | INTRAVENOUS | Status: DC

## 2023-05-12 MED ORDER — LIDOCAINE-EPINEPHRINE 1 %-1:100000 IJ SOLN
INTRAMUSCULAR | Status: AC
  Filled 2023-05-12: qty 1

## 2023-05-12 MED ORDER — ONDANSETRON HCL 4 MG/2ML IJ SOLN
INTRAMUSCULAR | Status: DC | PRN
  Administered 2023-05-12: 4 mg via INTRAVENOUS

## 2023-05-12 MED ORDER — FENTANYL CITRATE (PF) 250 MCG/5ML IJ SOLN
INTRAMUSCULAR | Status: DC | PRN
  Administered 2023-05-12 (×2): 50 ug via INTRAVENOUS
  Administered 2023-05-12: 150 ug via INTRAVENOUS

## 2023-05-12 MED ORDER — THROMBIN 5000 UNITS EX SOLR
CUTANEOUS | Status: AC
  Filled 2023-05-12: qty 5000

## 2023-05-12 MED ORDER — SODIUM CHLORIDE 0.9 % IV SOLN
INTRAVENOUS | Status: DC | PRN
Start: 2023-05-12 — End: 2023-05-12

## 2023-05-12 MED ORDER — HEMOSTATIC AGENTS (NO CHARGE) OPTIME
TOPICAL | Status: DC | PRN
  Administered 2023-05-12: 1 via TOPICAL

## 2023-05-12 MED ORDER — DOCUSATE SODIUM 100 MG PO CAPS
100.0000 mg | ORAL_CAPSULE | Freq: Two times a day (BID) | ORAL | Status: DC
  Administered 2023-05-12 – 2023-05-15 (×5): 100 mg via ORAL
  Filled 2023-05-12 (×5): qty 1

## 2023-05-12 MED ORDER — THROMBIN 5000 UNITS EX SOLR
OROMUCOSAL | Status: DC | PRN
  Administered 2023-05-12: 5 mL via TOPICAL

## 2023-05-12 MED ORDER — CHLORHEXIDINE GLUCONATE 0.12 % MT SOLN: 15.0000 mL | Freq: Once | OROMUCOSAL | Status: AC

## 2023-05-12 MED ORDER — ONDANSETRON HCL 4 MG/2ML IJ SOLN
INTRAMUSCULAR | Status: AC
  Filled 2023-05-12: qty 2

## 2023-05-12 MED ORDER — PROMETHAZINE HCL 25 MG PO TABS
12.5000 mg | ORAL_TABLET | ORAL | Status: DC | PRN
  Administered 2023-05-13: 25 mg via ORAL
  Filled 2023-05-12: qty 1

## 2023-05-12 MED ORDER — ORAL CARE MOUTH RINSE: 15.0000 mL | Freq: Once | OROMUCOSAL | Status: AC

## 2023-05-12 SURGICAL SUPPLY — 88 items
BAG COUNTER SPONGE SURGICOUNT (BAG) ×1 IMPLANT
BAG SPNG CNTER NS LX DISP (BAG) ×1
BIT DRILL WIRE PASS 1.3MM (BIT) IMPLANT
BLADE CLIPPER SURG (BLADE) ×1 IMPLANT
BUR CARBIDE MATCH 3.0 (BURR) ×1 IMPLANT
BUR SPIRAL ROUTER 2.3 (BUR) ×1 IMPLANT
CANISTER SUCT 3000ML PPV (MISCELLANEOUS) ×1 IMPLANT
CASSETTE SUCT IRRIG SONOPET IQ (MISCELLANEOUS) IMPLANT
COVER BURR HOLE UNIV 10 (Orthopedic Implant) IMPLANT
COVERAGE SUPPORT O-ARM STEALTH (MISCELLANEOUS) ×1 IMPLANT
DRAIN JACKSON RD 7FR 3/32 (WOUND CARE) IMPLANT
DRAIN JP 10F RND RADIO (DRAIN) IMPLANT
DRAIN RELI 100 BL SUC LF ST (DRAIN)
DRAPE MICROSCOPE SLANT 54X150 (MISCELLANEOUS) ×1 IMPLANT
DRAPE NEUROLOGICAL W/INCISE (DRAPES) ×1 IMPLANT
DRAPE SHEET LG 3/4 BI-LAMINATE (DRAPES) ×1 IMPLANT
DRAPE SURG 17X23 STRL (DRAPES) IMPLANT
DRAPE WARM FLUID 44X44 (DRAPES) ×1 IMPLANT
DRILL WIRE PASS 1.3MM (BIT) IMPLANT
DRSG TELFA 3X8 NADH STRL (GAUZE/BANDAGES/DRESSINGS) IMPLANT
DURAPREP 6ML APPLICATOR 50/CS (WOUND CARE) ×1 IMPLANT
ELECT COATED BLADE 2.86 ST (ELECTRODE) ×2 IMPLANT
ELECT REM PT RETURN 9FT ADLT (ELECTROSURGICAL) ×1 IMPLANT
ELECTRODE REM PT RTRN 9FT ADLT (ELECTROSURGICAL) ×1 IMPLANT
EVACUATOR SILICONE 100CC (DRAIN) IMPLANT
FEE COVERAGE SUPPORT O-ARM (MISCELLANEOUS) ×1 IMPLANT
FORCEPS BIPO MALIS IRRIG 9X1.5 (NEUROSURGERY SUPPLIES) ×1 IMPLANT
GAUZE 4X4 16PLY ~~LOC~~+RFID DBL (SPONGE) IMPLANT
GAUZE SPONGE 4X4 12PLY STRL (GAUZE/BANDAGES/DRESSINGS) IMPLANT
GLOVE BIO SURGEON STRL SZ7 (GLOVE) ×2 IMPLANT
GLOVE BIOGEL PI IND STRL 7.5 (GLOVE) ×2 IMPLANT
GLOVE BIOGEL PI IND STRL 8 (GLOVE) ×2 IMPLANT
GLOVE ECLIPSE 8.0 STRL XLNG CF (GLOVE) ×2 IMPLANT
GLOVE EXAM NITRILE XL STR (GLOVE) IMPLANT
GOWN STRL REUS W/ TWL LRG LVL3 (GOWN DISPOSABLE) IMPLANT
GOWN STRL REUS W/ TWL XL LVL3 (GOWN DISPOSABLE) ×3 IMPLANT
GOWN STRL REUS W/TWL 2XL LVL3 (GOWN DISPOSABLE) IMPLANT
GOWN STRL REUS W/TWL LRG LVL3 (GOWN DISPOSABLE)
GOWN STRL REUS W/TWL XL LVL3 (GOWN DISPOSABLE) ×3
GRAFT DURAGEN MATRIX 5WX7L (Graft) IMPLANT
HEMOSTAT POWDER KIT SURGIFOAM (HEMOSTASIS) ×1 IMPLANT
HEMOSTAT SNOW SURGICEL 2X4 (HEMOSTASIS) IMPLANT
HEMOSTAT SURGICEL 2X14 (HEMOSTASIS) ×1 IMPLANT
HEMOSTAT SURGICEL 2X4 FIBR (HEMOSTASIS) IMPLANT
HOOK RETRACTION 12 ELAST STAY (MISCELLANEOUS) IMPLANT
IV NS 1000ML (IV SOLUTION) ×1
IV NS 1000ML BAXH (IV SOLUTION) ×1 IMPLANT
KIT BASIN OR (CUSTOM PROCEDURE TRAY) ×1 IMPLANT
KIT TURNOVER KIT B (KITS) ×1 IMPLANT
MARKER SPHERE PSV REFLC NDI (MISCELLANEOUS) ×3 IMPLANT
NDL HYPO 22X1.5 SAFETY MO (MISCELLANEOUS) ×1 IMPLANT
NEEDLE HYPO 22X1.5 SAFETY MO (MISCELLANEOUS) ×1 IMPLANT
NS IRRIG 1000ML POUR BTL (IV SOLUTION) ×3 IMPLANT
PACK BATTERY CMF DISP FOR DVR (ORTHOPEDIC DISPOSABLE SUPPLIES) IMPLANT
PACK CRANIOTOMY CUSTOM (CUSTOM PROCEDURE TRAY) ×1 IMPLANT
PATTIES SURGICAL .5 X.5 (GAUZE/BANDAGES/DRESSINGS) IMPLANT
PATTIES SURGICAL .5 X3 (DISPOSABLE) IMPLANT
PATTIES SURGICAL 1X1 (DISPOSABLE) IMPLANT
PERFORATOR LRG 14-11MM (BIT) ×1 IMPLANT
PIN MAYFIELD SKULL DISP (PIN) ×1 IMPLANT
PLATE CRANIAL 12 2H RIGID UNI (Plate) IMPLANT
RETRACTOR LONE STAR DISPOSABLE (INSTRUMENTS) ×2 IMPLANT
SCREW UNIII AXS SD 1.5X4 (Screw) IMPLANT
SET CARTRIDGE AND TUBING (SET/KITS/TRAYS/PACK) IMPLANT
SET TUBING IRRIGATION DISP (TUBING) ×1 IMPLANT
SPONGE NEURO XRAY DETECT 1X3 (DISPOSABLE) IMPLANT
SPONGE SURGIFOAM ABS GEL 100 (HEMOSTASIS) ×1 IMPLANT
STAPLER VISISTAT (STAPLE) IMPLANT
STAPLER VISISTAT 35W (STAPLE) ×1 IMPLANT
STOCKINETTE 6 STRL (DRAPES) ×1 IMPLANT
STRIP CLOSURE SKIN 1/2X4 (GAUZE/BANDAGES/DRESSINGS) ×1 IMPLANT
SUT ETHILON 3 0 FSL (SUTURE) IMPLANT
SUT ETHILON 3 0 PS 1 (SUTURE) IMPLANT
SUT NURALON 4 0 TR CR/8 (SUTURE) ×3 IMPLANT
SUT VIC AB 0 CT1 18XCR BRD8 (SUTURE) ×1 IMPLANT
SUT VIC AB 0 CT1 8-18 (SUTURE) ×1
SUT VIC AB 2-0 CP2 18 (SUTURE) ×1 IMPLANT
SUT VICRYL RAPIDE 4/0 PS 2 (SUTURE) ×1 IMPLANT
TIP SHEAR CVD EXTENDED 36KH (INSTRUMENTS) IMPLANT
TIP TISSUE SONOPET IQ STD 12 (TIP) IMPLANT
TOWEL GREEN STERILE (TOWEL DISPOSABLE) ×1 IMPLANT
TOWEL GREEN STERILE FF (TOWEL DISPOSABLE) ×1 IMPLANT
TRAY FOLEY MTR SLVR 16FR STAT (SET/KITS/TRAYS/PACK) ×1 IMPLANT
TUBE CONNECTING 12X1/4 (SUCTIONS) ×1 IMPLANT
TUBING FEATHERFLOW (TUBING) IMPLANT
UNDERPAD 30X36 HEAVY ABSORB (UNDERPADS AND DIAPERS) ×1 IMPLANT
WATER STERILE IRR 1000ML POUR (IV SOLUTION) ×1 IMPLANT
WRENCH TORQUE 36KHZ (INSTRUMENTS) IMPLANT

## 2023-05-12 NOTE — Progress Notes (Signed)
Occupational Therapy Treatment Patient Details Name: LAWSON HOGREFE MRN: [id number] DOB: 1945-12-21 Today's Date: 05/12/2023   History of present illness Pt is a 77 y/o male presenting 8/27 with worsening 4-day h/o Left sided weakness. 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 Now plan for Right steriostatic frontal craniotomy for tumor resection on 9/3. PMHx:  DM, HTN, HLD.   OT comments  Pt is making steady progress towards their acute OT goals. Session focused on cognition goal. Overall he demonstrates good STM and delayed recall. He needed min A for path finding tasks, and also had notable increase in LOBs when dual tasking while ambulating needed up to min A to correct.  OT to continue to follow acutely to facilitate progress towards established goals. Pt will continue to benefit from intensive inpatient follow up therapy, >3 hours/day after discharge.        If plan is discharge home, recommend the following:  A lot of help with walking and/or transfers;A lot of help with bathing/dressing/bathroom;Assistance with cooking/housework;Direct supervision/assist for medications management;Direct supervision/assist for financial management;Assist for transportation;Help with stairs or ramp for entrance   Equipment Recommendations  Other (comment) Defer      Precautions / Restrictions Precautions Precautions: Fall Restrictions Weight Bearing Restrictions: No       Mobility Bed Mobility Overal bed mobility: Needs Assistance Bed Mobility: Supine to Sit, Sit to Supine     Supine to sit: Supervision Sit to supine: Supervision        Transfers Overall transfer level: Needs assistance Equipment used: Rolling walker (2 wheels) Transfers: Sit to/from Stand Sit to Stand: Supervision           General transfer comment: minA for LOBs during mobility throughout     Balance Overall balance assessment: Needs assistance Sitting-balance support:  Single extremity supported, Bilateral upper extremity supported, Feet supported Sitting balance-Leahy Scale: Fair     Standing balance support: During functional activity, No upper extremity supported Standing balance-Leahy Scale: Fair                             ADL either performed or assessed with clinical judgement   ADL Overall ADL's : Needs assistance/impaired                                       General ADL Comments: session focused on cognitive goals; pt declined need for ADLs    Extremity/Trunk Assessment Upper Extremity Assessment Upper Extremity Assessment: Right hand dominant LUE Deficits / Details: decr strength as compared to R. Decreased coordination with finger to nose especially once near therapist finger.   Lower Extremity Assessment Lower Extremity Assessment: Defer to PT evaluation        Vision   Vision Assessment?: No apparent visual deficits   Perception Perception Perception: Not tested   Praxis Praxis Praxis: Not tested    Cognition Arousal: Alert Behavior During Therapy: WFL for tasks assessed/performed Overall Cognitive Status: Impaired/Different from baseline Area of Impairment: Problem solving, Attention                   Current Attention Level: Selective         Problem Solving: Slow processing, Requires verbal cues General Comments: delayed recall is [facility], pt needed minimal cues for 3 step pathfinding task. Noted increased in LOBs during dual tasking  with walking and cognitive tasks              General Comments VSS on RA, wife present    Pertinent Vitals/ Pain       Pain Assessment Pain Assessment: No/denies pain  Home Living Family/patient expects to be discharged to:: Private residence Living Arrangements: Spouse/significant other Available Help at Discharge: Family;Available 24 hours/day Type of Home: House Home Access: Stairs to enter     Home Layout: One level     Bathroom  Shower/Tub: Chief Strategy Officer: Standard Bathroom Accessibility: Yes   Home Equipment: Agricultural consultant (2 wheels)              Frequency  Min 1X/week        Progress Toward Goals  OT Goals(current goals can now be found in the care plan section)  Progress towards OT goals: Progressing toward goals  Acute Rehab OT Goals Patient Stated Goal: to go to rehab OT Goal Formulation: With patient Time For Goal Achievement: 05/23/23 Potential to Achieve Goals: Good ADL Goals Pt Will Perform Grooming: with modified independence;standing Pt Will Perform Lower Body Dressing: with modified independence;sit to/from stand Pt Will Transfer to Toilet: with modified independence;ambulating Additional ADL Goal #1: Pt will perofrm 3 step path finding with supervision and no cues for memory or problem solving   AM-PAC OT "6 Clicks" Daily Activity     Outcome Measure   Help from another person eating meals?: Total (NPO) Help from another person taking care of personal grooming?: A Little Help from another person toileting, which includes using toliet, bedpan, or urinal?: A Little Help from another person bathing (including washing, rinsing, drying)?: A Little Help from another person to put on and taking off regular upper body clothing?: A Little Help from another person to put on and taking off regular lower body clothing?: A Little 6 Click Score: 16    End of Session Equipment Utilized During Treatment: Gait belt;Rolling walker (2 wheels)  OT Visit Diagnosis: Unsteadiness on feet (R26.81);Muscle weakness (generalized) (M62.81);Other symptoms and signs involving cognitive function   Activity Tolerance Patient tolerated treatment well   Patient Left in bed;with bed alarm set;with call bell/phone within reach   Nurse Communication Mobility status        Time: 1610-9604 OT Time Calculation (min): 17 min  Charges: OT General Charges $OT Visit: 1 Visit OT  Treatments $Therapeutic Activity: 8-22 mins  Derenda Mis, OTR/L Acute Rehabilitation Services Office [phone] Secure Chat Communication Preferred   Donia Pounds 05/12/2023, 12:49 PM

## 2023-05-12 NOTE — Anesthesia Procedure Notes (Signed)
Arterial Line Insertion Start/End9/11/2022 1:45 PM, 05/12/2023 1:50 PM Performed by: Alease Medina, CRNA, CRNA  Patient location: Pre-op. Preanesthetic checklist: patient identified, IV checked, site marked, risks and benefits discussed, surgical consent, monitors and equipment checked, pre-op evaluation, timeout performed and anesthesia consent Lidocaine 1% used for infiltration radial was placed Catheter size: 20 G Hand hygiene performed  and maximum sterile barriers used   Attempts: 1 Procedure performed without using ultrasound guided technique. Following insertion, dressing applied. Post procedure assessment: normal and unchanged  Additional procedure comments: Performed by SRNA under supervision.

## 2023-05-12 NOTE — Plan of Care (Signed)
  Problem: Education: Goal: Knowledge of General Education information will improve Description: Including pain rating scale, medication(s)/side effects and non-pharmacologic comfort measures Outcome: Progressing   Problem: Clinical Measurements: Goal: Ability to maintain clinical measurements within normal limits will improve Outcome: Progressing   Problem: Safety: Goal: Ability to remain free from injury will improve Outcome: Progressing   

## 2023-05-12 NOTE — Op Note (Signed)
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 Kendell Bane C. Andrell Tallman, DO  ASSISTANT: Dr. Hoyt Koch, MD; Patrici Ranks, PA (no qualified resident available)  ANESTHESIA: General Endotracheal  EBL: 150 cc  SPECIMENS: Right frontal tumor  DRAINS: None  COMPLICATIONS: None  CONDITION: Hemodynamically stable  HISTORY: Alex Church is a 77 y.o. male presented with left-sided weakness and was found to have a large right frontal intra-axial tumor on workup.  Metastatic workup was negative.  MRI revealed a heterogenously enhancing, ring-enhancing tumor centered in the right frontal lobe concerning for high-grade glioma.  Had extensive discussion with the patient and his wife about options including stereotactic biopsy, versus surgical resection.  Given its location I recommended surgical resection for maximal treatment efficacy.  We extensively went over risks, benefits and expected outcomes, including but not limited to bleeding, stroke, seizure, weakness, headaches, CSF leak, etc.  Informed consent was obtained and witnessed.  PROCEDURE IN DETAIL: The patient was brought to the operating room. After induction of general anesthesia, the patient was positioned on the operative table in the supine position. All pressure points were meticulously padded.  Head was placed in [place] head holder and slightly turned to the left.  Right frontal region was clipped free of hair.  Using the neuronavigation, he was registered and verified with anatomical landmarks with excellent accuracy.  Right frontal skin incision was then marked out and prepped and  draped in the usual sterile fashion. Physician driven timeout was performed.  Local anesthetic was injected into the wound.  Using 10 blade, incision was made sharply down to the paracranium, Raney clips were applied.  Incision was then carried further along the temporalis.  Cutaneous flap was reflected anteriorly and posteriorly.  A small portion of the temporalis was divided with Bovie electrocautery down to the cranium.  Using navigation, the craniotomy was planned.  Using high-speed drill, craniotomy flap was created and elevated in standard fashion.  Epidural hemostasis was achieved with Surgifoam.  A curvilinear durotomy was then used with a 15 blade and Metzenbaum scissors.  4-0 Nurolon retraction was placed along the remaining dura and dural flap.  Microscope was sterilely draped and brought into the field for the remainder the procedure.  There were multiple large frontal gyri that were clearly expanded from tumor and had discoloration along the cortex.  Using neuronavigation, we plan the corticectomy along the anterior portion of the tumor using bipolar forceps and 15 blade.  I then circumferentially created a plane along the normal-appearing white matter anteriorly and then laterally.  This was carried down to the depth of the lateral ventricle where I visualized the ependyma.  This was then crossed a moderate-sized cortical draining vein that appeared to be only draining the tumor gyri.  This was coagulated and cut sharply.  We then created a plane posteriorly to the tumor capsule using bipolar forceps and suction.  Attention was then turned to the medial portion of the tumor, I again the capsule of the tumor was coagulated with bipolar forceps along the normal white matter keeping the tumor contained and disconnecting any white matter parasitized vessels sharply.  Once the tumor was circumferentially isolated, we then carried the dissection along the depth  of the tumor.  The tumor was then removed  and sent for permanent pathology.  The deep white matter was then carefully dissected with suction technique and ultrasonic aspirator.  There is a small portion in which we entered the lateral ventricle.  This was covered with Gelfoam and Surgicel.  We circumferentially explored the white matter in the resection cavity and it appeared to be beyond the enhancing portion on the neuronavigation as well as it appeared to have no further thickening or tumor like texture.  Hemostasis was achieved excellently with bipolar forceps, Surgifoam and tamponade with cotton balls for series of minutes.  Cotton balls were copiously irrigated and removed.  The resection cavity was copiously irrigated again and noted be excellently hemostatic.  This was then lined with Surgicel for hemostasis.  We then closed the dura with 4-0 Nurolon sutures in interrupted fashion.  Epidural hemostasis was achieved with Surgifoam.  DuraGen was placed over the durotomy site.  The cranial flap was then affixed to its original anatomic position with cranial plating system.  Self-retaining retractors were removed, and the Raney clips were removed.  The wound was closed in layers, temporalis fascia with 2-0 Vicryl sutures.  Donnetta Hutching was closed with 2-0 Vicryl sutures.  Skin was closed with staples.  Sterile dressing applied.  At the end of the case all sponge, needle, and instrument counts were correct. The patient was then transferred to the stretcher, Mayfield head holder removed, extubated, and taken to the post-anesthesia care unit in stable hemodynamic condition.

## 2023-05-12 NOTE — Progress Notes (Signed)
PROGRESS NOTE        PATIENT DETAILS Name: Alex Church Age: 77 y.o. Sex: male Date of Birth: 1945/09/18 Admit Date: 05/05/2023 Admitting Physician Azucena Fallen, MD PCP:Paz, Nolon Rod, MD  Brief Summary: Patient is a 77 y.o.  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 8/28>> CT abdomen/pelvis: No evidence of primary neoplasm in chest/abdomen/pelvis.  Significant microbiology data: 8/27>> COVID PCR: Negative  Procedures: None  Consults: Neurosurgery Neuro-oncology  Subjective: No complaints-for craniotomy later today.  Objective: Vitals: Blood pressure (!) 142/75, pulse 68, temperature 97.6 F (36.4 C), temperature source Oral, resp. rate 18, height 6' (1.829 m), weight 88.1 kg, SpO2 93%.   Exam: Awake/alert Nonfocal exam  Pertinent Labs/Radiology:    Latest Ref Rng & Units 05/11/2023    3:05 AM 05/09/2023    4:08 AM 05/06/2023    9:02 AM  CBC  WBC 4.0 - 10.5 K/uL 14.7  14.7  9.7   Hemoglobin 13.0 - 17.0 g/dL 16.1  09.6  04.5   Hematocrit 39.0 - 52.0 % 38.3  38.1  37.2   Platelets 150 - 400 K/uL 197  198  198     Lab Results  Component Value Date   NA 131 (L) 05/11/2023   K 4.2 05/11/2023   CL 104 05/11/2023   CO2 20 (L) 05/11/2023      Assessment/Plan: 7 cm right frontal brain mass with mass effect/edema Suspicion for high-grade glioma After extensive discussion with neurosurgery and then with neuro-oncology-patient has  now decided to proceed with craniotomy/excision of the brain mass-this is scheduled for later today.   Continue Decadron PT/OT eval Neurosurgery/neuro-oncology following  Acute metabolic encephalopathy Likely secondary to above Relatively awake and alert-Per family/patient this is a very subtle finding. TSH/B12/ammonia-stable  DM-2 (A1c 6.9 on 5/13) with uncontrolled hyperglycemia due to steroids CBGs better controlled Semglee 16 units, Premeal NovoLog 6 units and SSI   Recent Labs    05/11/23 1649 05/11/23 2105 05/12/23 0906  GLUCAP 113* 216* 197*    HTN BP stable Clonidine/Coreg/lisinopril Follow/optimize  HLD Lipitor  Hypomagnesemia Repleted  Debility/deconditioning PT/OT eval Likely will need CIR  BMI: Estimated body mass index is 26.34 kg/m as calculated from the following:   Height as of this encounter: 6' (1.829 m).   Weight as of this encounter: 88.1 kg.   Code status:   Code Status: Full Code   DVT Prophylaxis: SCD's Start: 05/11/23 1957 SCDs Start: 05/05/23 2028   Family Communication: Spouse at bedside   Disposition Plan: Status is: Inpatient Remains inpatient appropriate because: Severity of illness   Planned Discharge Destination:Home   Diet: Diet Order             Diet NPO time specified  Diet effective ____                     Antimicrobial agents: Anti-infectives (From admission, onward)  Start     Dose/Rate Route Frequency Ordered Stop   05/12/23 1300  ceFAZolin (ANCEF) IVPB 2g/100 mL premix        2 g 200 mL/hr over 30 Minutes Intravenous 30 min pre-op 05/12/23 0202          MEDICATIONS: Scheduled Meds:  atorvastatin  10 mg Oral QHS   carvedilol  12.5 mg Oral BID WC   cloNIDine  0.2 mg Oral BID   dexamethasone (DECADRON) injection  4 mg Intravenous Q6H   famotidine  20 mg Oral BID   insulin aspart  0-20 Units Subcutaneous TID WC   insulin aspart  0-5 Units Subcutaneous QHS   insulin aspart  6 Units  Subcutaneous TID WC   insulin glargine-yfgn  16 Units Subcutaneous Daily   lisinopril  20 mg Oral BID   melatonin  5 mg Oral QHS   Continuous Infusions:   ceFAZolin (ANCEF) IV     PRN Meds:.acetaminophen **OR** acetaminophen, alum & mag hydroxide-simeth, ondansetron (ZOFRAN) IV, mouth rinse   I have personally reviewed following labs and imaging studies  LABORATORY DATA: CBC: Recent Labs  Lab 05/05/23 1301 05/06/23 0902 05/09/23 0408 05/11/23 0305  WBC 9.1 9.7 14.7* 14.7*  NEUTROABS 6.6 8.8*  --   --   HGB 12.5* 12.5* 12.5* 12.5*  HCT 38.6* 37.2* 38.1* 38.3*  MCV 93.0 92.5 92.3 91.0  PLT 202 198 198 197    Basic Metabolic Panel: Recent Labs  Lab 05/05/23 1301 05/06/23 0902 05/06/23 0903 05/07/23 0348 05/09/23 0408 05/11/23 0305  NA 136 135  --  133* 132* 131*  K 4.3 4.4  --  4.1 4.0 4.2  CL 101 100  --  102 101 104  CO2 25 22  --  23 23 20*  GLUCOSE 148* 277*  --  224* 260* 180*  BUN 18 17  --  19 32* 39*  CREATININE 1.20 1.17  --  1.37* 1.18 1.23  CALCIUM 9.1 9.0  --  8.6* 8.6* 8.4*  MG  --  1.1* 1.1* 1.8  --   --     GFR: Estimated Creatinine Clearance: 56.1 mL/min (by C-G formula based on SCr of 1.23 mg/dL).  Liver Function Tests: Recent Labs  Lab 05/05/23 1301 05/06/23 0902 05/11/23 0305  AST 18 18 33  ALT 15 17 73*  ALKPHOS 62 70 51  BILITOT 0.5 0.7 0.7  PROT 7.2 6.6 5.5*  ALBUMIN 3.8 3.4* 2.7*   No results for input(s): "LIPASE", "AMYLASE" in the last 168 hours. Recent Labs  Lab 05/06/23 0902  AMMONIA 26    Coagulation Profile: Recent Labs  Lab 05/06/23 0902  INR 1.1    Cardiac Enzymes: Recent Labs  Lab 05/06/23 0902  CKTOTAL 74    BNP (last 3 results) No results for input(s): "PROBNP" in the last 8760 hours.  Lipid Profile: No results for input(s): "CHOL", "HDL", "LDLCALC", "TRIG", "CHOLHDL", "LDLDIRECT" in the last 72 hours.  Thyroid Function Tests: No results for input(s): "TSH", "T4TOTAL", "FREET4", "T3FREE",  "THYROIDAB" in the last 72 hours.   Anemia Panel: No results for input(s): "VITAMINB12", "FOLATE", "FERRITIN", "TIBC", "IRON", "RETICCTPCT" in the last 72 hours.   Urine analysis:    Component Value Date/Time   COLORURINE YELLOW 05/05/2023 1411   APPEARANCEUR CLEAR 05/05/2023 1411   LABSPEC 1.020 05/05/2023 1411   PHURINE 5.0 05/05/2023 1411   GLUCOSEU NEGATIVE 05/05/2023 1411   HGBUR NEGATIVE 05/05/2023 1411   BILIRUBINUR NEGATIVE 05/05/2023 1411   KETONESUR NEGATIVE  05/05/2023 1411   PROTEINUR NEGATIVE 05/05/2023 1411   NITRITE NEGATIVE 05/05/2023 1411   LEUKOCYTESUR NEGATIVE 05/05/2023 1411    Sepsis Labs: Lactic Acid, Venous No results found for: "LATICACIDVEN"  MICROBIOLOGY: Recent Results (from the past 240 hour(s))  SARS Coronavirus 2 by RT PCR (hospital order, performed in [facility] hospital lab) *cepheid single result test* Anterior Nasal Swab     Status: None   Collection Time: 05/05/23  1:01 PM   Specimen: Anterior Nasal Swab  Result Value Ref Range Status   SARS Coronavirus 2 by RT PCR NEGATIVE NEGATIVE Final    Comment: (NOTE) SARS-CoV-2 target nucleic acids are NOT DETECTED.  The SARS-CoV-2 RNA is generally detectable in upper and lower respiratory specimens during the acute phase of infection. The lowest concentration of SARS-CoV-2 viral copies this assay can detect is 250 copies / mL. A negative result does not preclude SARS-CoV-2 infection and should not be used as the sole basis for treatment or other patient management decisions.  A negative result may occur with improper specimen collection / handling, submission of specimen other than nasopharyngeal swab, presence of viral mutation(s) within the areas targeted by this assay, and inadequate number of viral copies (<250 copies / mL). A negative result must be combined with clinical observations, patient history, and epidemiological information.  Fact Sheet for Patients:    RoadLapTop.co.za  Fact Sheet for Healthcare Providers: http://kim-miller.com/  This test is not yet approved or  cleared by the [place] FDA and has been authorized for detection and/or diagnosis of SARS-CoV-2 by FDA under an Emergency Use Authorization (EUA).  This EUA will remain in effect (meaning this test can be used) for the duration of the COVID-19 declaration under Section 564(b)(1) of the Act, 21 U.S.C. section 360bbb-3(b)(1), unless the authorization is terminated or revoked sooner.  Performed at [facility], [address] [place], [place] [address]     RADIOLOGY STUDIES/RESULTS: No results found.   LOS: 7 days   Jeoffrey Massed, MD  [facility]    To contact the attending provider between 7A-7P or the covering provider during after hours 7P-7A, please log into the web site [email or web address] and access using universal [place] password for that web site. If you do not have the password, please call the hospital operator.  05/12/2023, 10:27 AM

## 2023-05-12 NOTE — Anesthesia Procedure Notes (Signed)
Procedure Name: Intubation Date/Time: 05/12/2023 2:30 PM  Performed by: Margarita Rana, CRNAPre-anesthesia Checklist: Patient identified, Emergency Drugs available, Suction available and Patient being monitored Patient Re-evaluated:Patient Re-evaluated prior to induction Oxygen Delivery Method: Circle System Utilized Preoxygenation: Pre-oxygenation with 100% oxygen Induction Type: IV induction Ventilation: Mask ventilation without difficulty Laryngoscope Size: Mac and 4 Grade View: Grade I Tube type: Oral Tube size: 7.5 mm Number of attempts: 1 Airway Equipment and Method: Stylet and Oral airway Placement Confirmation: ETT inserted through vocal cords under direct vision, positive ETCO2 and breath sounds checked- equal and bilateral Secured at: 24 cm Tube secured with: Tape Dental Injury: Teeth and Oropharynx as per pre-operative assessment

## 2023-05-12 NOTE — Progress Notes (Signed)
   Providing Compassionate, Quality Care - Together  NEUROSURGERY PROGRESS NOTE   S: No issues overnight.   O: EXAM:  BP (!) 144/70   Pulse 61   Temp (!) 97.4 F (36.3 C) (Oral)   Resp 17   Ht 6' (1.829 m)   Wt 88.1 kg   SpO2 97%   BMI 26.34 kg/m   Awake, alert, oriented x 3 PERRL Speech fluent, appropriate  CNs grossly intact  Left upper/lower extremity 4+/5 throughout Right upper/lower extremity 5/5 throughout Mild pronator drift on the left   ASSESSMENT:  76 y.o. 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 Informed consent obtained and witnessed.     Thank you for allowing me to participate in this patient's care.  Please do not hesitate to call with questions or concerns.   Monia Pouch, DO Neurosurgeon [facility] [phone]

## 2023-05-12 NOTE — Consult Note (Signed)
NAME:  Alex Church, MRN:  [id number], DOB:  02-25-1946, LOS: 7 ADMISSION DATE:  05/05/2023, CONSULTATION DATE:  9/3 REFERRING MD:  Dr. Jake Samples, CHIEF COMPLAINT:  R frontal tumor s/p craniotomy   History of Present Illness:  Patient is a 77 yo M w/ pertinent PMH DMT2, HTN, HLD presents admitted to [facility] on 8/27 w/ left sided weakness.  Patient has been having left sided weakness that started on 8/24. Came to [facility] for further eval on 8/27. Per family patient has been having increased confusion and more forgetful over the past few months. Denies any fever or sob. On ED arrival patient afebrile and sbp 140-160s. Patient alert; sensation in tact b/l; b/l muscle strength. CT head showing right frontal mass w/ 5 mm leftward midline shift. MRI brain showing large right frontal lobe mass measuring 6.8 x 4.9 cm; consistent w/ high grade glioma; leftward midline shift 6mm. NSG consulted. Started on decadron. Admitted to floors by TRH. On 9/3 craniotomy performed for resection of tumor. Post op going to neuro icu for closer observation. PCCM consulted for medical management while in icu.  Pertinent  Medical History   Past Medical History:  Diagnosis Date   B12 deficiency    monthly shots   Diabetes mellitus    Glaucoma suspect    Hyperlipemia    Hypertension      Significant Hospital Events: Including procedures, antibiotic start and stop dates in addition to other pertinent events   8/27 right frontal tumor; nsg consulted started on decdron 9/3 s/p craniotomy and tumor resection; pccm consulted while in icu  Interim History / Subjective:  Doing well post op, extubated successfully. SBP 150 range, goal < 150.  Objective   Blood pressure (!) 144/70, pulse 61, temperature (!) 97.4 F (36.3 C), temperature source Oral, resp. rate 17, height 6' (1.829 m), weight 88.1 kg, SpO2 97%.        Intake/Output Summary (Last 24 hours) at 05/12/2023 1808 Last data filed at 05/12/2023 1722 Gross per 24 hour   Intake 1050 ml  Output 1850 ml  Net -800 ml   Filed Weights   05/10/23 0300 05/11/23 0500 05/12/23 0500  Weight: 89.8 kg 90.2 kg 88.1 kg    Examination: General: Adult male, resting in bed, in NAD. Neuro: A&O x 3, no deficits, MAE. HEENT: Right scalp incision C/D/I. Sclerae anicteric. EOMI. Cardiovascular: RRR, no M/R/G.  Lungs: Respirations even and unlabored.  CTA bilaterally, No W/R/R. Abdomen: BS x 4, soft, NT/ND.  Musculoskeletal: No gross deformities, no edema.  Skin: Intact, warm, no rashes.   Assessment & Plan:   Large frontal tumor s/p craniotomy w/ tumor resection Acute metabolic encephalopathy -suspicious for high grade glioma Plan: -monitor in icu post op -nsg following; appreciate recs -cont Decadron, Keppra -frequent neuro checks -limit sedating meds -SBP goal <150 per nsg; prn hydralazine -Continue neuroprotective measures- normothermia, euglycemia, HOB greater than 30, head in neutral alignment, normocapnia, normoxia.  -PT/OT  DMT2 Plan: -ssi and cbg monitoring -cont basal insulin  HTN HLD Plan: -Continue Coreg, Clonidine, Lisinopril, Lipitor -PRN hydralazine and PRN Labetalol for SBP goal <150  Leukocytosis -remains afebrile; no clear evidence of infection; likely steroid induced Plan: -trend cbc -trend wbc/fever curve  Mild Hyponatremia Plan: -consider further workup -trend bmp   Best Practice (right click and "Reselect all SmartList Selections" daily)   Diet/type: Regular consistency (see orders) DVT prophylaxis: SCD GI prophylaxis: N/A Lines: N/A Foley:  N/A Code Status:  full code Last date of  multidisciplinary goals of care discussion [pending]  Labs   CBC: Recent Labs  Lab 05/06/23 0902 05/09/23 0408 05/11/23 0305  WBC 9.7 14.7* 14.7*  NEUTROABS 8.8*  --   --   HGB 12.5* 12.5* 12.5*  HCT 37.2* 38.1* 38.3*  MCV 92.5 92.3 91.0  PLT 198 198 197    Basic Metabolic Panel: Recent Labs  Lab 05/06/23 0902  05/06/23 0903 05/07/23 0348 05/09/23 0408 05/11/23 0305  NA 135  --  133* 132* 131*  K 4.4  --  4.1 4.0 4.2  CL 100  --  102 101 104  CO2 22  --  23 23 20*  GLUCOSE 277*  --  224* 260* 180*  BUN 17  --  19 32* 39*  CREATININE 1.17  --  1.37* 1.18 1.23  CALCIUM 9.0  --  8.6* 8.6* 8.4*  MG 1.1* 1.1* 1.8  --   --    GFR: Estimated Creatinine Clearance: 56.1 mL/min (by C-G formula based on SCr of 1.23 mg/dL). Recent Labs  Lab 05/06/23 0902 05/09/23 0408 05/11/23 0305  WBC 9.7 14.7* 14.7*    Liver Function Tests: Recent Labs  Lab 05/06/23 0902 05/11/23 0305  AST 18 33  ALT 17 73*  ALKPHOS 70 51  BILITOT 0.7 0.7  PROT 6.6 5.5*  ALBUMIN 3.4* 2.7*   No results for input(s): "LIPASE", "AMYLASE" in the last 168 hours. Recent Labs  Lab 05/06/23 0902  AMMONIA 26    ABG No results found for: "PHART", "PCO2ART", "PO2ART", "HCO3", "TCO2", "ACIDBASEDEF", "O2SAT"   Coagulation Profile: Recent Labs  Lab 05/06/23 0902  INR 1.1    Cardiac Enzymes: Recent Labs  Lab 05/06/23 0902  CKTOTAL 74    HbA1C: Hgb A1c MFr Bld  Date/Time Value Ref Range Status  05/06/2023 09:02 AM 6.9 (H) 4.8 - 5.6 % Final    Comment:    (NOTE) Pre diabetes:          5.7%-6.4%  Diabetes:              >6.4%  Glycemic control for   <7.0% adults with diabetes   01/19/2023 04:01 PM 6.9 (H) 4.6 - 6.5 % Final    Comment:    Glycemic Control Guidelines for People with Diabetes:Non Diabetic:  <6%Goal of Therapy: <7%Additional Action Suggested:  >8%     CBG: Recent Labs  Lab 05/11/23 1156 05/11/23 1649 05/11/23 2105 05/12/23 0906 05/12/23 1218  GLUCAP 203* 113* 216* 197* 180*    Review of Systems:   All negative; except for those that are bolded, which indicate positives.  Constitutional: weight loss, weight gain, night sweats, fevers, chills, fatigue, weakness.  HEENT: headaches, sore throat, sneezing, nasal congestion, post nasal drip, difficulty swallowing, tooth/dental  problems, visual complaints, visual changes, ear aches. Neuro: difficulty with speech, weakness, numbness, ataxia. CV:  chest pain, orthopnea, PND, swelling in lower extremities, dizziness, palpitations, syncope.  Resp: cough, hemoptysis, dyspnea, wheezing. GI: heartburn, indigestion, abdominal pain, nausea, vomiting, diarrhea, constipation, change in bowel habits, loss of appetite, hematemesis, melena, hematochezia.  GU: dysuria, change in color of urine, urgency or frequency, flank pain, hematuria. MSK: joint pain or swelling, decreased range of motion. Psych: change in mood or affect, depression, anxiety, suicidal ideations, homicidal ideations. Skin: rash, itching, bruising.   Past Medical History:  He,  has a past medical history of B12 deficiency, Diabetes mellitus, Glaucoma suspect, Hyperlipemia, and Hypertension.   Surgical History:   Past Surgical History:  Procedure Laterality Date  NO PAST SURGERIES       Social History:   reports that he has never smoked. He has never used smokeless tobacco. He reports that he does not drink alcohol and does not use drugs.   Family History:  His family history includes Cancer in his mother; Cirrhosis in his cousin; Diabetes in his cousin and maternal uncle; Heart attack (age of onset: 74) in his brother. There is no history of Stroke, Colon cancer, or Prostate cancer.   Allergies No Known Allergies   Home Medications  Prior to Admission medications   Medication Sig Start Date End Date Taking? Authorizing Provider  allopurinol (ZYLOPRIM) 100 MG tablet Take 1 tablet (100 mg total) by mouth 2 (two) times daily. 11/25/22  Yes Paz, Nolon Rod, MD  atorvastatin (LIPITOR) 10 MG tablet Take 1 tablet (10 mg total) by mouth at bedtime. Due for appt 05/2023 03/23/23  Yes Wanda Plump, MD  carvedilol (COREG) 12.5 MG tablet Take 1 tablet (12.5 mg total) by mouth 2 (two) times daily with a meal. Due for appt 05/2023 03/23/23  Yes Wanda Plump, MD  cloNIDine  (CATAPRES) 0.2 MG tablet Take 1 tablet (0.2 mg total) by mouth 2 (two) times daily. 09/09/22  Yes Paz, Nolon Rod, MD  cyanocobalamin (VITAMIN B12) 1000 MCG tablet Take 1,000 mcg by mouth every other day.   Yes [provider]  famotidine (PEPCID) 20 MG tablet Take 20 mg by mouth 2 (two) times daily.   Yes [provider]  JANUVIA 100 MG tablet Take 1 tablet (100 mg total) by mouth daily. 05/04/23  Yes Paz, Nolon Rod, MD  lisinopril (ZESTRIL) 20 MG tablet Take 1 tablet (20 mg total) by mouth 2 (two) times daily. 05/04/23  Yes Wanda Plump, MD  Magnesium Oxide 400 MG CAPS Take 2 capsules (800 mg total) by mouth 2 (two) times daily. 08/18/18  Yes Wanda Plump, MD  metFORMIN (GLUCOPHAGE) 1000 MG tablet Take 1 tablet (1,000 mg total) by mouth 2 (two) times daily with a meal. 03/02/23  Yes Paz, Nolon Rod, MD  pioglitazone (ACTOS) 30 MG tablet Take 1 tablet (30 mg total) by mouth daily. 01/29/23  Yes Paz, Nolon Rod, MD  Blood Glucose Monitoring Suppl [facility] VERIO FLEX SYSTEM) w/Device KIT Check blood sugar once daily 05/24/18   Wanda Plump, MD  glucose blood test strip Check blood sugars once daily 04/11/20   Wanda Plump, MD  ONE [facility] LANCETS MISC Check blood sugar once daily 05/24/18   Wanda Plump, MD     Critical care time: 30 min.    Rutherford Guys, PA - [facility] For pager details, please see AMION or use Epic chat  After 1900, please call [facility] for cross coverage needs 05/12/2023, 9:03 PM

## 2023-05-12 NOTE — Progress Notes (Signed)
CCC Pre-op Review  Pre-op checklist:   initiated per RN  NPO:  yes  Labs:  wbc 14.7 ( suspected UTI  "a few days ago" per rn)  Na 131 Alb 2.7  BUN 39  h/h 12.5/38.3     NO COAGS noted  Consent:  signed per rn  H&P:  TBD  Vitals:  0905  142/75 (96)  hr 50's-60's r 16-22   O2 requirements:  RA  MAR/PTA review:  has had insulin this am cbg 197  ( have been high d/t DM + steroids)  IV:   Floor nurse name:   Louretta Parma [phone]  Additional info:   Coreg held at 0800

## 2023-05-12 NOTE — Progress Notes (Signed)
Inpatient Rehab Admissions Coordinator:    CIR following. Will follow up after tumor resection.   Megan Salon, MS, CCC-SLP Rehab Admissions Coordinator  [phone] (celll) [phone] (office)

## 2023-05-12 NOTE — Progress Notes (Addendum)
eLink Physician-Brief Progress Note Patient Name: Alex Church DOB: [birth year] MRN: [id number]   Date of Service  05/12/2023  HPI/Events of Note  77 year old with a history of diabetes, hypertension, dyslipidemia that presented with left-sided weakness found to have a right-sided frontal lobe mass concerning for glioma s/p resection on 9/3 and transferred to the ICU in hemodynamically stable condition after extubation.  Vitals are normal on arrival on 2 L of oxygen saturating 97%.  No longer requiring phenylephrine.  Morning laboratory studies consistent with minimal none anion gap acidosis, mild hyponatremia and hyperglycemia in the 200s.  eICU Interventions  SBP goal < 150, Hydralazine and labetalol available  MR Brain pending  Neuro checks q1h  SSI for glycemic control  Dc famotidine while on PPI for gi prophylaxis SCD for DVT phyophylaxis   0047 - Refractory vomiting, add compazine   Intervention Category Evaluation Type: New Patient Evaluation  Alex Church 05/12/2023, 8:25 PM

## 2023-05-12 NOTE — Transfer of Care (Addendum)
Immediate Anesthesia Transfer of Care Note  Patient: Alex Church  Procedure(s) Performed: RIGHT STERIOSTACTIC FRONTAL CRANIOTOMY FOR TUMOR RESECTION (Right) APPLICATION OF CRANIAL NAVIGATION (Right)  Patient Location: PACU   Anesthesia Type: GENERAL   Level of Consciousness: drowsy  Airway & Oxygen Therapy: Patient Spontanous Breathing and Patient connected to nasal cannula oxygen  Post-op Assessment: Report given to RN and Post -op Vital signs reviewed and stable  Post vital signs: Reviewed and stable  Last Vitals:  Vitals Value Taken Time  BP 127/59 05/12/23 1818  Temp    Pulse 74 05/12/23 1821  Resp 14 05/12/23 1821  SpO2 97 % 05/12/23 1821  Vitals shown include unfiled device data.  Last Pain:  Vitals:   05/12/23 1320  TempSrc: Oral  PainSc: 0-No pain         Complications: No notable events documented.

## 2023-05-12 NOTE — Care Management Important Message (Signed)
Important Message  Patient Details  Name: Alex Church MRN: [id number] Date of Birth: 04/16/1946   Medicare Important Message Given:  Yes     Sherilyn Banker 05/12/2023, 12:34 PM

## 2023-05-12 NOTE — Anesthesia Preprocedure Evaluation (Addendum)
Anesthesia Evaluation  Patient identified by MRN, date of birth, ID band Patient awake    Reviewed: Allergy & Precautions, NPO status , Patient's Chart, lab work & pertinent test results  Airway Mallampati: II  TM Distance: >3 FB Neck ROM: Full    Dental no notable dental hx.    Pulmonary neg pulmonary ROS   Pulmonary exam normal        Cardiovascular hypertension, Pt. on home beta blockers and Pt. on medications Normal cardiovascular exam  ECHO: Left ventricle: The cavity size was normal. Wall thickness was  normal. Systolic function was normal. The estimated ejection  fraction was in the range of 55% to 60%. Wall motion was normal;  there were no regional wall motion abnormalities. Features are consistent with a pseudonormal left ventricular filling pattern,  with concomitant abnormal relaxation and increased filling  pressure (grade 2 diastolic dysfunction).  Aortic valve: Trileaflet; mildly thickened, mildly calcified  leaflets.  Mitral valve: There was mild regurgitation.  Left atrium: The atrium was mildly dilated.     Neuro/Psych  negative psych ROS   GI/Hepatic negative GI ROS, Neg liver ROS,,,  Endo/Other  diabetes, Oral Hypoglycemic Agents    Renal/GU negative Renal ROS     Musculoskeletal  (+) Arthritis ,    Abdominal   Peds  Hematology  (+) Blood dyscrasia, anemia   Anesthesia Other Findings RIGHT FRONTAL TUMOR  Reproductive/Obstetrics                             Anesthesia Physical Anesthesia Plan  ASA: 3  Anesthesia Plan: General   Post-op Pain Management:    Induction: Intravenous  PONV Risk Score and Plan: 2 and Ondansetron, Dexamethasone, Midazolam and Treatment may vary due to age or medical condition  Airway Management Planned: Oral ETT  Additional Equipment: Arterial line  Intra-op Plan:   Post-operative Plan: Extubation in OR  Informed Consent: I  have reviewed the patients History and Physical, chart, labs and discussed the procedure including the risks, benefits and alternatives for the proposed anesthesia with the patient or authorized representative who has indicated his/her understanding and acceptance.     Dental advisory given  Plan Discussed with: CRNA  Anesthesia Plan Comments:        Anesthesia Quick Evaluation

## 2023-05-13 ENCOUNTER — Ambulatory Visit: Payer: Medicare Other | Admitting: Internal Medicine

## 2023-05-13 ENCOUNTER — Inpatient Hospital Stay (HOSPITAL_COMMUNITY): Payer: Medicare Other

## 2023-05-13 DIAGNOSIS — G9340 Encephalopathy, unspecified: Secondary | ICD-10-CM | POA: Diagnosis not present

## 2023-05-13 LAB — POCT I-STAT 7, (LYTES, BLD GAS, ICA,H+H)
Acid-base deficit: 2 mmol/L (ref 0.0–2.0)
Bicarbonate: 23.6 mmol/L (ref 20.0–28.0)
Calcium, Ion: 1.22 mmol/L (ref 1.15–1.40)
HCT: 38 % — ABNORMAL LOW (ref 39.0–52.0)
Hemoglobin: 12.9 g/dL — ABNORMAL LOW (ref 13.0–17.0)
O2 Saturation: 100 %
Potassium: 4.3 mmol/L (ref 3.5–5.1)
Sodium: 135 mmol/L (ref 135–145)
TCO2: 25 mmol/L (ref 22–32)
pCO2 arterial: 41.8 mmHg (ref 32–48)
pH, Arterial: 7.359 (ref 7.35–7.45)
pO2, Arterial: 493 mmHg — ABNORMAL HIGH (ref 83–108)

## 2023-05-13 LAB — BASIC METABOLIC PANEL
Anion gap: 11 (ref 5–15)
BUN: 37 mg/dL — ABNORMAL HIGH (ref 8–23)
CO2: 19 mmol/L — ABNORMAL LOW (ref 22–32)
Calcium: 8 mg/dL — ABNORMAL LOW (ref 8.9–10.3)
Chloride: 106 mmol/L (ref 98–111)
Creatinine, Ser: 0.97 mg/dL (ref 0.61–1.24)
GFR, Estimated: >60 mL/min (ref >60)
Glucose, Bld: 118 mg/dL — ABNORMAL HIGH (ref 70–99)
Potassium: 4.1 mmol/L (ref 3.5–5.1)
Sodium: 136 mmol/L (ref 135–145)

## 2023-05-13 LAB — CBC
HCT: 40.4 % (ref 39.0–52.0)
Hemoglobin: 13.6 g/dL (ref 13.0–17.0)
MCH: 30.2 pg (ref 26.0–34.0)
MCHC: 33.7 g/dL (ref 30.0–36.0)
MCV: 89.6 fL (ref 80.0–100.0)
Platelets: 253 10*3/uL (ref 150–400)
RBC: 4.51 MIL/uL (ref 4.22–5.81)
RDW: 14.6 % (ref 11.5–15.5)
WBC: 30 10*3/uL — ABNORMAL HIGH (ref 4.0–10.5)
nRBC: 0 % (ref 0.0–0.2)

## 2023-05-13 LAB — GLUCOSE, CAPILLARY
Glucose-Capillary: 110 mg/dL — ABNORMAL HIGH (ref 70–99)
Glucose-Capillary: 166 mg/dL — ABNORMAL HIGH (ref 70–99)
Glucose-Capillary: 213 mg/dL — ABNORMAL HIGH (ref 70–99)
Glucose-Capillary: 121 mg/dL — ABNORMAL HIGH (ref 70–99)
Glucose-Capillary: 171 mg/dL — ABNORMAL HIGH (ref 70–99)

## 2023-05-13 LAB — MAGNESIUM: Magnesium: 1.8 mg/dL (ref 1.7–2.4)

## 2023-05-13 MED ORDER — PROCHLORPERAZINE EDISYLATE 10 MG/2ML IJ SOLN
10.0000 mg | Freq: Four times a day (QID) | INTRAMUSCULAR | Status: DC | PRN
  Administered 2023-05-13: 10 mg via INTRAVENOUS
  Filled 2023-05-13: qty 2

## 2023-05-13 MED ORDER — PANTOPRAZOLE SODIUM 40 MG IV SOLR
40.0000 mg | Freq: Two times a day (BID) | INTRAVENOUS | Status: DC
  Administered 2023-05-13 – 2023-05-14 (×4): 40 mg via INTRAVENOUS
  Filled 2023-05-13 (×4): qty 10

## 2023-05-13 MED ORDER — DEXAMETHASONE SODIUM PHOSPHATE 4 MG/ML IJ SOLN
4.0000 mg | Freq: Three times a day (TID) | INTRAMUSCULAR | Status: DC
  Administered 2023-05-13 – 2023-05-14 (×2): 4 mg via INTRAVENOUS
  Filled 2023-05-13 (×2): qty 1

## 2023-05-13 MED ORDER — HEPARIN SODIUM (PORCINE) 5000 UNIT/ML IJ SOLN
5000.0000 [IU] | Freq: Three times a day (TID) | INTRAMUSCULAR | Status: DC
  Administered 2023-05-14 – 2023-05-15 (×4): 5000 [IU] via SUBCUTANEOUS
  Filled 2023-05-13 (×4): qty 1

## 2023-05-13 MED ORDER — GADOBUTROL 1 MMOL/ML IV SOLN
8.0000 mL | Freq: Once | INTRAVENOUS | Status: AC | PRN
  Administered 2023-05-13: 8 mL via INTRAVENOUS

## 2023-05-13 MED FILL — Thrombin For Soln 20000 Unit: CUTANEOUS | Qty: 1 | Status: AC

## 2023-05-13 NOTE — Anesthesia Postprocedure Evaluation (Signed)
Anesthesia Post Note  Patient: Alex Church  Procedure(s) Performed: RIGHT STERIOSTACTIC FRONTAL CRANIOTOMY FOR TUMOR RESECTION (Right) APPLICATION OF CRANIAL NAVIGATION (Right)     Patient location during evaluation: PACU Anesthesia Type: General Level of consciousness: responds to stimulation Pain management: pain level controlled Vital Signs Assessment: post-procedure vital signs reviewed and stable Respiratory status: spontaneous breathing, nonlabored ventilation and respiratory function stable Cardiovascular status: blood pressure returned to baseline and stable Postop Assessment: no apparent nausea or vomiting Anesthetic complications: no   No notable events documented.  Last Vitals:  Vitals:   05/13/23 0500 05/13/23 0600  BP: 132/60 127/63  Pulse: (!) 105 (!) 106  Resp: 17 17  Temp:    SpO2: 94% 93%    Last Pain:  Vitals:   05/13/23 0400  TempSrc: Oral  PainSc: 3                  Llewelyn Sheaffer P Evelina Lore

## 2023-05-13 NOTE — PMR Pre-admission (Signed)
PMR Admission Coordinator Pre-Admission Assessment  Patient: Alex Church is an 77 y.o., male MRN: [id number] DOB: [birth year] Height: 6' (1.829 m) Weight: 85.4 kg  Insurance Information HMO:     PPO:      PCP:      IPA:      80/20:      OTHER:  PRIMARY: Blue Medicare       Policy#: [id number]       Subscriber: Pt CM Name: TBD      Phone#: TBD     Fax#: [phone] Pre-Cert#: [id number] Received insurance authorization from [place] on 05/15/23     Employer: Retired Benefits:  Phone #: [phone]     Name: checked Blue-E Portal Eff. Date: 09/08/22     Deduct: $0      Out of Pocket Max: $3500 ($0 met)      Life Max: N/A CIR: $335/day for days 1-5      SNF: $0 for days 1-20; $203 for days 21-100 Outpatient: med nec     Co-Pay: $10/visit Home Health: 100%      Co-Pay: none DME: 80%     Co-Pay: 20% Providers: in network  SECONDARY:       Policy#:       Phone#:   Artist:       Phone#:   The Data processing manager" for patients in Inpatient Rehabilitation Facilities with attached "Privacy Act Statement-Health Care Records" was provided and verbally reviewed with: Patient  Emergency Contact Information Contact Information     Name Relation Home Work Mobile   Bubel,Angela Spouse [phone]      Other Contacts   None on File     Current Medical History  Patient Admitting Diagnosis: Brain Mass History of Present Illness: Alex Church is a 77 y.o. male with medical history significant for type 2 diabetes mellitus, essential hypertension, hyperlipidemia, who is admitted to [facility] on 05/05/2023 by way of transfer from [facility] with new diagnosis of right frontal brain mass after presenting from home to [facility] ED complaining of left-sided weakness.   The patient presented with complaint of 4 days of left-sided weakness involving the left upper and lower extremity, starting on Saturday, 05/02/2023.  At [facility] ED, course was as follows: Vital signs in the ED were notable for the following: Afebrile; heart rate in the 60s to 80s; systolic blood pressures in the 140s to 160s; respiratory rate 16-20, oxygen saturation 95 to 100% on room air. Labs were notable for the following: CMP notable for the following: Sodium 136, creatinine 1.20, glucose 148.  CBC notable for white cell count 9100.  Urinalysis notable for white blood cells, as well as leukocyte esterase/nitrate negative.  COVID-19 PCR negative.  EKG in ED demonstrated the following: Sinus rhythm with heart rate 70, normal intervals, no evidence of T wave or ST changes, Cleen evidence of ST ovation.Imaging in the ED, per corresponding formal radiology read, was notable for the following: 1 view chest x-ray showed no evidence of acute cardiopulmonary process, including evidence of ventricular edema, effusion, or pneumothorax.  MRI brain with and without contrast showed large mass located within the right frontal lobe measuring 6.8 x 4.9 cm associate with mass effect on the right lateral ventricle and 6 mm of leftward midline shift in the absence of any evidence of acute infarct in the absence of any evidence of intracranial hemorrhage. While in the ED, the following were  administered: Decadron 4 mg IV x 1 dose. Pt. Seen was transferred to [facility] and seen by Neurosurgery, who took pt. To OR for resection of tumor 05/13/23. Pt. Seen by PT/OT during his admission and they recommend CIR to assist return to PLOF.     Patient's medical record from [facility]  has been reviewed by the rehabilitation admission coordinator and physician.  Past Medical History  Past Medical History:  Diagnosis Date   B12 deficiency    monthly shots   Diabetes mellitus    Glaucoma suspect    Hyperlipemia    Hypertension     Has the patient had major surgery during 100 days prior to admission? Yes  Family History   family  history includes Cancer in his mother; Cirrhosis in his cousin; Diabetes in his cousin and maternal uncle; Heart attack (age of onset: 33) in his brother.  Current Medications  Current Facility-Administered Medications:    0.9 %  sodium chloride infusion (Manually program via Guardrails IV Fluids), , Intravenous, Once, Holtzman, Ariel Leffew, CRNA   0.9 %  sodium chloride infusion, , Intravenous, Continuous, Regalado, Belkys A, MD, Stopped at 05/13/23 0831   acetaminophen (TYLENOL) tablet 650 mg, 650 mg, Oral, Q6H PRN **OR** acetaminophen (TYLENOL) suppository 650 mg, 650 mg, Rectal, Q6H PRN, Howerter, Justin B, DO   alum & mag hydroxide-simeth (MAALOX/MYLANTA) 200-200-20 MG/5ML suspension 15 mL, 15 mL, Oral, Q4H PRN, Ghimire, Werner Lean, MD, 15 mL at 05/07/23 1409   atorvastatin (LIPITOR) tablet 10 mg, 10 mg, Oral, QHS, Howerter, Justin B, DO, 10 mg at 05/12/23 2232   carvedilol (COREG) tablet 12.5 mg, 12.5 mg, Oral, BID WC, Howerter, Justin B, DO, 12.5 mg at 05/13/23 0827   Chlorhexidine Gluconate Cloth 2 % PADS 6 each, 6 each, Topical, Daily, Dawley, Troy C, DO, 6 each at 05/13/23 0945   cloNIDine (CATAPRES) tablet 0.2 mg, 0.2 mg, Oral, BID, Howerter, Justin B, DO, 0.2 mg at 05/12/23 2232   dexamethasone (DECADRON) injection 4 mg, 4 mg, Intravenous, Q6H, Howerter, Justin B, DO, 4 mg at 05/13/23 0501   docusate sodium (COLACE) capsule 100 mg, 100 mg, Oral, BID, Patrici Ranks [place], PA-C, 100 mg at 05/12/23 2232   hydrALAZINE (APRESOLINE) injection 10-40 mg, 10-40 mg, Intravenous, Q4H PRN, Lidia Collum, PA-C, 40 mg at 05/13/23 0157   HYDROcodone-acetaminophen (NORCO/VICODIN) 5-325 MG per tablet 1 tablet, 1 tablet, Oral, Q4H PRN, Patrici Ranks [place], PA-C   HYDROmorphone (DILAUDID) injection 0.5 mg, 0.5 mg, Intravenous, Q3H PRN, Patrici Ranks [place], PA-C, 0.5 mg at 05/13/23 0042   insulin aspart (novoLOG) injection 0-20 Units, 0-20 Units, Subcutaneous, Q4H, Lidia Collum, PA-C, 4 Units at  05/13/23 0827   insulin glargine-yfgn (SEMGLEE) injection 16 Units, 16 Units, Subcutaneous, Daily, Ghimire, Shanker M, MD, 16 Units at 05/12/23 0919   labetalol (NORMODYNE) injection 10-40 mg, 10-40 mg, Intravenous, Q10 min PRN, Patrici Ranks [place], PA-C, 20 mg at 05/13/23 4098   levETIRAcetam (KEPPRA) IVPB 500 mg/100 mL premix, 500 mg, Intravenous, Q12H, Patrici Ranks [place], PA-C, Last Rate: 400 mL/hr at 05/13/23 0939, 500 mg at 05/13/23 0939   lisinopril (ZESTRIL) tablet 20 mg, 20 mg, Oral, BID, Howerter, Justin B, DO, 20 mg at 05/12/23 2232   melatonin tablet 5 mg, 5 mg, Oral, QHS, Ghimire, Shanker M, MD, 5 mg at 05/12/23 2232   mupirocin ointment (BACTROBAN) 2 % 1 Application, 1 Application, Nasal, BID, Desai, Rahul P, PA-C, 1 Application at 05/12/23 2232  ondansetron (ZOFRAN) injection 4 mg, 4 mg, Intravenous, Q6H PRN, Howerter, Justin B, DO, 4 mg at 05/13/23 0013   Oral care mouth rinse, 15 mL, Mouth Rinse, PRN, Ghimire, Werner Lean, MD   pantoprazole (PROTONIX) injection 40 mg, 40 mg, Intravenous, Q12H, Regalado, Belkys A, MD   polyethylene glycol (MIRALAX / GLYCOLAX) packet 17 g, 17 g, Oral, Daily PRN, Patrici Ranks [place], PA-C   prochlorperazine (COMPAZINE) injection 10 mg, 10 mg, Intravenous, Q6H PRN, Paliwal, Aditya, MD, 10 mg at 05/13/23 0057   promethazine (PHENERGAN) tablet 12.5-25 mg, 12.5-25 mg, Oral, Q4H PRN, Patrici Ranks [place], PA-C, 25 mg at 05/13/23 0827   sodium phosphate (FLEET) enema 1 enema, 1 enema, Rectal, Once PRN, Clovis Riley, PA-C  Patients Current Diet:  Diet Order             Diet Heart Room service appropriate? Yes; Fluid consistency: Thin  Diet effective now                   Precautions / Restrictions Precautions Precautions: Fall Restrictions Weight Bearing Restrictions: No   Has the patient had 2 or more falls or a fall with injury in the past year? No  Prior Activity Level Community (5-7x/wk): pt. active in the community  PTA  Prior Functional Level Self Care: Did the patient need help bathing, dressing, using the toilet or eating? Independent  Indoor Mobility: Did the patient need assistance with walking from room to room (with or without device)? Independent  Stairs: Did the patient need assistance with internal or external stairs (with or without device)? Independent  Functional Cognition: Did the patient need help planning regular tasks such as shopping or remembering to take medications? Independent  Patient Information Are you of Hispanic, Latino/a,or Spanish origin?: A. No, not of Hispanic, Latino/a, or Spanish origin What is your race?: A. White Do you need or want an interpreter to communicate with a doctor or health care staff?: 1. Yes  Patient's Response To:  Health Literacy and Transportation Is the patient able to respond to health literacy and transportation needs?: Yes Health Literacy - How often do you need to have someone help you when you read instructions, pamphlets, or other written material from your doctor or pharmacy?: Never In the past 12 months, has lack of transportation kept you from medical appointments or from getting medications?: No In the past 12 months, has lack of transportation kept you from meetings, work, or from getting things needed for daily living?: No  Home Assistive Devices / Equipment Home Assistive Devices/Equipment: Blood pressure cuff, CBG Meter, Eyeglasses, Hand-held shower hose, Reacher, Scales Home Equipment: Agricultural consultant (2 wheels)  Prior Device Use: Indicate devices/aids used by the patient prior to current illness, exacerbation or injury? None of the above  Current Functional Level Cognition  Overall Cognitive Status: Impaired/Different from baseline Current Attention Level: Selective Orientation Level: Oriented X4 Following Commands: Follows one step commands consistently Safety/Judgement: Decreased awareness of safety General Comments:  delayed recall is [facility], pt needed minimal cues for 3 step pathfinding task. Noted increased in LOBs during dual tasking with walking and cognitive tasks    Extremity Assessment (includes Sensation/Coordination)  Upper Extremity Assessment: Right hand dominant LUE Deficits / Details: decr strength as compared to R. Decreased coordination with finger to nose especially once near therapist finger.  Lower Extremity Assessment: Defer to PT evaluation    ADLs  Overall ADL's : Needs assistance/impaired Eating/Feeding: Supervision/ safety, Sitting Eating/Feeding Details (indicate cue type and  reason): for balnace in unsupported sititng Grooming: Contact guard assist, Standing, Oral care Upper Body Bathing: Set up, Sitting Lower Body Bathing: Sit to/from stand, Minimal assistance Upper Body Dressing : Set up, Sitting Lower Body Dressing: Minimal assistance, Sit to/from stand Toilet Transfer: Minimal assistance, Rolling walker (2 wheels), Ambulation Functional mobility during ADLs: Minimal assistance, Rolling walker (2 wheels) General ADL Comments: session focused on cognitive goals; pt declined need for ADLs    Mobility  Overal bed mobility: Needs Assistance Bed Mobility: Supine to Sit, Sit to Supine Supine to sit: Supervision Sit to supine: Supervision General bed mobility comments: pt received up in hallway with niece.    Transfers  Overall transfer level: Needs assistance Equipment used: Rolling walker (2 wheels) Transfers: Sit to/from Stand Sit to Stand: Supervision General transfer comment: minA for LOBs during mobility throughout    Ambulation / Gait / Stairs / Wheelchair Mobility  Ambulation/Gait Ambulation/Gait assistance: Contact guard assist Gait Distance (Feet): 300 Feet (+~215ft with niece prior to PTA arrival) Assistive device: Rolling walker (2 wheels), None Gait Pattern/deviations: Step-through pattern, Narrow base of support General Gait Details: Mild unsteadiness  without AD but no buckling or overt LOB, pt with narrow BOS and some steps near to crossing midline, but no scissoring. Min cues for wider BOS, environmental navigation. Pt performs head turns, 180* turns, step over box and speed changes without significant difficulty. Pt defers stair trial due to fatigue so did not perform full DGI due to this. Gait velocity interpretation: <1.8 ft/sec, indicate of risk for recurrent falls Stairs:  (pt defers)    Posture / Balance Dynamic Sitting Balance Sitting balance - Comments: no lean sitting up in chair, limited assessment as pt already standing when PTA arrived Balance Overall balance assessment: Needs assistance Sitting-balance support: Single extremity supported, Bilateral upper extremity supported, Feet supported Sitting balance-Leahy Scale: Fair Sitting balance - Comments: no lean sitting up in chair, limited assessment as pt already standing when PTA arrived Standing balance support: During functional activity, No upper extremity supported Standing balance-Leahy Scale: Fair Standing balance comment: no significant LOB but pt appears more confident using RW, rec continuing RW when walking room/hallway with family assist. Standardized Balance Assessment Standardized Balance Assessment : Dynamic Gait Index Dynamic Gait Index Level Surface: Mild Impairment Change in Gait Speed: Mild Impairment Gait with Horizontal Head Turns: Normal Gait with Vertical Head Turns: Normal Gait and Pivot Turn: Normal Step Over Obstacle: Mild Impairment    Special needs/care consideration Skin Abrasion: arm/left; Ecchymosis: arm/bilateral; Petechiae: arm,chest/bilateral; Surgical incision: head/right and Diabetic Management: Semglee 16 units daily; Novolog 0-9  units every 4 hours   Previous Home Environment (from acute therapy documentation) Living Arrangements: Spouse/significant other  Lives With: Spouse Available Help at Discharge: Family, Available 24  hours/day Type of Home: House Home Layout: One level Home Access: Stairs to enter Bathroom Shower/Tub: Engineer, manufacturing systems: Pharmacist, community: Yes Home Care Services: No  Discharge Living Setting Plans for Discharge Living Setting: House Type of Home at Discharge: House Discharge Home Layout: One level Discharge Home Access: Stairs to enter Entrance Stairs-Rails: Left Entrance Stairs-Number of Steps: 3 Discharge Bathroom Shower/Tub: Walk-in shower Discharge Bathroom Toilet: Standard Discharge Bathroom Accessibility: Yes How Accessible: Accessible via walker Does the patient have any problems obtaining your medications?: No  Social/Family/Support Systems Patient Roles: Spouse Contact Information: [phone] Anticipated Caregiver: Johannes Daddio Anticipated Caregiver's Contact Information: 24/7 min A Caregiver Availability: 24/7 Discharge Plan Discussed with Primary Caregiver: Yes Is Caregiver In Agreement with  Plan?: No Does Caregiver/Family have Issues with Lodging/Transportation while Pt is in Rehab?: No  Goals Patient/Family Goal for Rehab: PT/OT Mod I Expected length of stay: 7-10 days Pt/Family Agrees to Admission and willing to participate: Yes Program Orientation Provided & Reviewed with Pt/Caregiver Including Roles  & Responsibilities: Yes  Decrease burden of Care through IP rehab admission: not anticipated  Possible need for SNF placement upon discharge: not anticipated  Patient Condition: I have reviewed medical records from [facility], spoken with CM, and patient and spouse. I met with patient at the bedside for inpatient rehabilitation assessment.  Patient will benefit from ongoing PT and OT, can actively participate in 3 hours of therapy a day 5 days of the week, and can make measurable gains during the admission.  Patient will also benefit from the coordinated team approach during an Inpatient Acute Rehabilitation  admission.  The patient will receive intensive therapy as well as Rehabilitation physician, nursing, social worker, and care management interventions.  Due to safety, skin/wound care, disease management, medication administration, pain management, and patient education the patient requires 24 hour a day rehabilitation nursing.  The patient is currently Contact G-Min A with mobility and basic ADLs.  Discharge setting and therapy post discharge at home with home health is anticipated.  Patient has agreed to participate in the Acute Inpatient Rehabilitation Program and will admit today.  Preadmission Screen Completed By:  Jeronimo Greaves, 05/13/2023 10:13 AM ______________________________________________________________________   Discussed status with Dr. Natale Lay on 05/15/23  at 10:50 AM and received approval for admission today.  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 Yes Co-Morbidities requiring supervision/potential complications: Metabolic encephalopathy, DM 2, HTN, HLD, Metabolic acidosis, Hypomagnesemia, nausea and vomiting Due to bladder management, bowel management, safety, skin/wound care, disease management, medication administration, pain management, and patient education, does the patient require 24 hr/day rehab nursing? Yes Does the patient require coordinated care of a physician, rehab nurse, PT, OT, and SLP to address physical and functional deficits in the context of the above medical diagnosis(es)? Yes Addressing deficits in the following areas: balance, endurance, locomotion, strength, transferring, bowel/bladder control, bathing, dressing, feeding, grooming, toileting,  cognition, speech, language, and psychosocial support Can the patient actively participate in an intensive therapy program of at least 3 hrs of therapy 5 days a week? Yes The potential for patient to make measurable gains while on inpatient rehab is excellent Anticipated functional outcomes upon discharge from inpatient rehab: modified independent PT, modified independent OT, modified independent SLP Estimated rehab length of stay to reach the above functional goals is: 7-10 days Anticipated discharge destination: Home 10. Overall Rehab/Functional Prognosis: excellent   MD Signature: Fanny Dance

## 2023-05-13 NOTE — Progress Notes (Signed)
Physical Therapy Re-Eval & Treatment Patient Details Name: Alex Church MRN: [id number] DOB: May 06, 1946 Today's Date: 05/13/2023   History of Present Illness Pt is a 77 y/o male presenting 8/27 with worsening 4-day h/o Left sided weakness. 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 Now plan for Right steriostatic frontal craniotomy for tumor resection on 9/3. PMHx:  DM, HTN, HLD.    PT Comments  Patient resting in bed and spouse at bedside. Pt reports mild headache that was unchanged with mobility. Supervision for safety with supine>sit EOB, pt using bed features with some cues needed to initiate. Pt requires min assist to power up from EOB to RW and cues for technique. Lt lean noted in standing and Lt leaning/drift with gait as well as reduced Lt LE coordination with foot placement. Pt amb ~60' with RW and min assist needed throughout to weight shift and steady. Pt agreeable to remain OOB in recliner and spouse present in room at EOS. Will continue to progress pt as able.    If plan is discharge home, recommend the following: A little help with walking and/or transfers;Two people to help with bathing/dressing/bathroom;Direct supervision/assist for medications management;Assist for transportation;Help with stairs or ramp for entrance;Supervision due to cognitive status   Can travel by private vehicle        Equipment Recommendations  Rolling walker (2 wheels)    Recommendations for Other Services       Precautions / Restrictions Precautions Precautions: Fall Restrictions Weight Bearing Restrictions: No     Mobility  Bed Mobility Overal bed mobility: Needs Assistance Bed Mobility: Supine to Sit, Sit to Supine     Supine to sit: Supervision Sit to supine: Supervision        Transfers Overall transfer level: Needs assistance Equipment used: Rolling walker (2 wheels) Transfers: Sit to/from Stand Sit to Stand: Supervision            General transfer comment: minA for LOBs during mobility throughout    Ambulation/Gait Ambulation/Gait assistance: Min assist Gait Distance (Feet): 60 Feet Assistive device: Rolling walker (2 wheels) Gait Pattern/deviations: Step-through pattern, Narrow base of support, Decreased dorsiflexion - right, Decreased dorsiflexion - left, Drifts right/left Gait velocity: decr     General Gait Details: min assist to steady balance and prevent anterior position of RW, pt with heavy reliance on RW for support. uncoordinated foot placement/advancement of the Lt>Rt LE. pt with knees flexing, decreased DF, and list Lt as fatigued.   Stairs             Wheelchair Mobility     Tilt Bed    Modified Rankin (Stroke Patients Only)       Balance Overall balance assessment: Needs assistance Sitting-balance support: Single extremity supported, Bilateral upper extremity supported, Feet supported Sitting balance-Leahy Scale: Fair     Standing balance support: During functional activity, No upper extremity supported Standing balance-Leahy Scale: Poor                              Cognition Arousal: Alert Behavior During Therapy: WFL for tasks assessed/performed Overall Cognitive Status: Impaired/Different from baseline Area of Impairment: Problem solving, Attention                   Current Attention Level: Selective Memory: Decreased short-term memory       Problem Solving: Slow processing, Requires verbal cues General Comments: delayed recall is [facility], pt needed  minimal cues for 3 step pathfinding task. Noted increased in LOBs during dual tasking with walking and cognitive tasks        Exercises      General Comments        Pertinent Vitals/Pain Pain Assessment Pain Assessment: Faces Faces Pain Scale: Hurts a little bit Pain Location: head Pain Descriptors / Indicators: Headache, Sore Pain Intervention(s): Limited activity within patient's tolerance,  Monitored during session, Repositioned    Home Living                          Prior Function            PT Goals (current goals can now be found in the care plan section) Acute Rehab PT Goals Patient Stated Goal: Back to independence after surgery PT Goal Formulation: With patient Time For Goal Achievement: 05/22/23 Potential to Achieve Goals: Good Progress towards PT goals: Progressing toward goals    Frequency    Min 1X/week      PT Plan      Co-evaluation              AM-PAC PT "6 Clicks" Mobility   Outcome Measure  Help needed turning from your back to your side while in a flat bed without using bedrails?: A Little Help needed moving from lying on your back to sitting on the side of a flat bed without using bedrails?: A Little Help needed moving to and from a bed to a chair (including a wheelchair)?: A Little Help needed standing up from a chair using your arms (e.g., wheelchair or bedside chair)?: A Little Help needed to walk in hospital room?: A Little Help needed climbing 3-5 steps with a railing? : A Lot 6 Click Score: 17    End of Session Equipment Utilized During Treatment: Gait belt Activity Tolerance: Patient tolerated treatment well Patient left: in chair;with call bell/phone within reach;with chair alarm set;with nursing/sitter in room;with family/visitor present (chair alarm set up (RN/NT doing chair bath and will complete set up)) Nurse Communication: Mobility status PT Visit Diagnosis: Unsteadiness on feet (R26.81);Other symptoms and signs involving the nervous system (R29.898)     Time: 6045-4098 PT Time Calculation (min) (ACUTE ONLY): 28 min  Charges:    $Gait Training: 8-22 mins PT General Charges $$ ACUTE PT VISIT: 1 Visit                     Wynn Maudlin, DPT Acute Rehabilitation Services Office [phone]  05/13/23 4:27 PM

## 2023-05-13 NOTE — Progress Notes (Signed)
   Providing Compassionate, Quality Care - Together  NEUROSURGERY PROGRESS NOTE   S: No issues overnight. Some vomiting and confusion  O: EXAM:  BP (!) 124/55   Pulse 71   Temp 98.3 F (36.8 C) (Axillary)   Resp 16   Ht 6' (1.829 m)   Wt 85.4 kg   SpO2 94%   BMI 25.53 kg/m   Awake, alert PERRL Speech fluent, appropriate  CNs grossly intact  5/5 BUE/BLE Dressing blood-tinged, wound does not appear to be draining  ASSESSMENT:  77 y.o. 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  Please do not hesitate to call with questions or concerns.   Monia Pouch, DO Neurosurgeon [facility] [phone]

## 2023-05-13 NOTE — Progress Notes (Signed)
Pt vomited large amount at 0010. RN gave 4mg  IV Zofran at 0013. RN also noticed increased blood on dressing from pt's crani surgical site. Dr. Jake Samples was already on the floor and was called to the bedside. He examined patient's incision and did not have concerns. RN also made him aware that patient's BP was in the 190s to low 200s during vomiting episode.   Pt vomited again at 0040 and 0055. During this time, RN reached out to e-link for more anti nausea meds. The only available med at the time was PO phenergan which the RN and patient did not feel like he would tolerate. Order obtained for IV Compazine 10mg .   RN also called Dr. Jake Samples to make him aware that patient had vomited 2 more times since he had seen the patient. RN asked if a repeat scan was needed, and MD said a repeat scan was not needed and confirmed nothing needed to be done.  During all episodes of vomiting, the patient's neuro exam remained the same.

## 2023-05-13 NOTE — Progress Notes (Signed)
Inpatient Rehab Admissions Coordinator:    CIR following, Pt. Not yet stable for d/c. Spoke with Pt, and wife and confirmed wife can provide 24/7 min assist after CIR. I will follow for medical readiness and participation with therapies and am hopeful for admission later this week.   Megan Salon, MS, CCC-SLP Rehab Admissions Coordinator  [phone] (celll) [phone] (office)

## 2023-05-13 NOTE — Progress Notes (Signed)
PROGRESS NOTE    Alex Church  BMW:[id number] DOB: Aug 12, 1946 DOA: 05/05/2023 PCP: Wanda Plump, MD   Brief Narrative: 77 year old with past medical history significant for diabetes type 2, hypertension, hyperlipidemia presents  with worsening 4 days history of left-sided weakness, on further evaluation with neuroimaging patient was found to have right frontal mass suspicious for a high-grade glioma.  Neurosurgery and neuro-oncology consulted.  Patient underwent craniotomy excision of the brain mass on 9/3 by Dr. Jake Samples.    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 Jake Samples  9/3 -Patient was started on Decadron -day one post sx, management per Neurosurgery.  -IV keppra IV BID>   Acute metabolic encephalopathy: Secondary to brain mass.  He is alert and oriented today.  TSH/ B12 stable   Diabetes type 2 with uncontrolled hyperglycemia due to steroids On Semglee and SSI.   Hypertension: On carvedilol, clonidine, lisinopril.   Hyperlipidemia: On Lipitor.   Metabolic acidosis; continue with IV fluids.  Leukocytosis; post sx, and in setting of steroids. Monitor.   Debility deconditioning: needs PT  Hypomagnesemia; replaced.   Nausea, vomiting. Suspect related to brain mass. Post sx.  CT abdomen pelvis negative for obstruction on admission.  IV protonix.  IV PRN phenergan.       Estimated body mass index is 25.53 kg/m as calculated from the following:   Height as of this encounter: 6' (1.829 m).   Weight as of this encounter: 85.4 kg.   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 8/28>> CT abdomen/pelvis: No evidence of primary neoplasm in chest/abdomen/pelvis.    Antimicrobials:    Subjective:   Objective: Vitals:   05/13/23 0400 05/13/23 0500 05/13/23 0600 05/13/23 0700  BP: 132/65 132/60 127/63 139/66  Pulse: (!) 107 (!) 105 (!) 106 98  Resp: 17 17 17 20   Temp: 98.4 F (36.9 C)     TempSrc: Oral     SpO2: 94% 94% 93% 94%  Weight:  85.4 kg    Height:        Intake/Output Summary (Last 24 hours) at 05/13/2023 0801 Last data filed at 05/13/2023 0700 Gross per 24 hour  Intake 2280.78 ml  Output 3160 ml  Net -879.22 ml   Filed Weights   05/11/23 0500 05/12/23 0500 05/13/23 0500  Weight: 90.2 kg 88.1 kg 85.4 kg    Examination:  General exam: Appears calm and comfortable  Respiratory system: Clear to auscultation. Respiratory effort normal. Cardiovascular system: S1 & S2 heard, RRR. No JVD, murmurs, rubs, gallops or clicks. No pedal edema. Gastrointestinal system: Abdomen is nondistended, soft and nontender. No organomegaly or masses felt. Normal bowel sounds heard. Central nervous system: Alert and oriented.  Staples frontal area.  Extremities:  no edema   Data Reviewed: I have personally reviewed following labs and imaging studies  CBC: Recent Labs  Lab 05/06/23 0902 05/09/23 0408 05/11/23 0305 05/13/23 0500  WBC 9.7 14.7* 14.7* 30.0*  NEUTROABS 8.8*  --   --   --   HGB 12.5* 12.5* 12.5* 13.6  HCT 37.2* 38.1* 38.3* 40.4  MCV 92.5 92.3 91.0 89.6  PLT 198 198 197 253    Basic Metabolic Panel: Recent Labs  Lab 05/06/23 0902 05/06/23 0903 05/07/23 0348 05/09/23 0408 05/11/23 0305 05/12/23 1844 05/13/23 0500  NA 135  --  133* 132* 131* 134* 136  K 4.4  --  4.1 4.0 4.2 4.3 4.1  CL 100  --  102 101 104 103 106  CO2 22  --  23 23 20* 20* 19*  GLUCOSE 277*  --  224* 260* 180* 190* 118*  BUN 17  --  19 32* 39* 38* 37*  CREATININE 1.17  --  1.37* 1.18 1.23 1.01 0.97  CALCIUM 9.0  --  8.6* 8.6* 8.4* 8.0* 8.0*  MG 1.1* 1.1* 1.8  --   --   --  1.8   GFR: Estimated Creatinine Clearance: 71.1 mL/min (by C-G formula based on SCr of 0.97 mg/dL). Liver Function Tests: Recent Labs  Lab 05/06/23 0902 05/11/23 0305  AST 18 33  ALT 17 73*  ALKPHOS 70 51  BILITOT 0.7 0.7  PROT 6.6 5.5*  ALBUMIN 3.4* 2.7*   No results for input(s): "LIPASE", "AMYLASE" in the last 168 hours. Recent Labs  Lab 05/06/23 0902  AMMONIA 26   Coagulation Profile: Recent Labs  Lab 05/06/23 0902  INR 1.1   Cardiac Enzymes: Recent Labs  Lab 05/06/23 0902  CKTOTAL 74   BNP (last 3 results) No results for input(s): "PROBNP" in the last 8760 hours. HbA1C: No results for input(s): "HGBA1C" in the last 72 hours. CBG: Recent Labs  Lab 05/12/23 1822 05/12/23 1954 05/12/23 2351 05/13/23 0347 05/13/23 0740  GLUCAP 176* 202* 131* 110* 166*   Lipid Profile: No results for input(s): "CHOL", "HDL", "LDLCALC", "TRIG", "CHOLHDL", "LDLDIRECT" in the last 72 hours. Thyroid Function Tests: No results for input(s): "TSH", "T4TOTAL", "FREET4", "T3FREE", "THYROIDAB" in the last 72 hours. Anemia Panel: No results for input(s): "VITAMINB12", "FOLATE", "FERRITIN", "TIBC", "IRON", "RETICCTPCT" in the last 72 hours. Sepsis Labs: No results for input(s): "PROCALCITON", "LATICACIDVEN" in the last 168 hours.  Recent Results (from the past 240 hour(s))  SARS Coronavirus 2 by RT PCR (hospital order, performed in [facility] hospital lab) *cepheid single result test* Anterior Nasal  Swab     Status: None   Collection Time: 05/05/23  1:01 PM   Specimen: Anterior Nasal Swab  Result Value Ref Range Status   SARS Coronavirus 2 by RT PCR NEGATIVE NEGATIVE Final    Comment: (NOTE) SARS-CoV-2 target nucleic acids are NOT DETECTED.  The SARS-CoV-2 RNA is generally detectable in upper and lower respiratory specimens during the acute phase of infection. The lowest concentration of SARS-CoV-2 viral copies this assay can detect is 250 copies / mL. A negative result does not preclude SARS-CoV-2 infection and should not be used as the sole basis for treatment or other patient management decisions.  A negative result may occur with improper specimen collection / handling, submission of specimen other than nasopharyngeal swab, presence of viral mutation(s) within the areas targeted by this assay, and inadequate number of viral copies (<250 copies / mL). A negative result must be combined with clinical  observations, patient history, and epidemiological information.  Fact Sheet for Patients:   RoadLapTop.co.za  Fact Sheet for Healthcare Providers: http://kim-miller.com/  This test is not yet approved or  cleared by the [place] FDA and has been authorized for detection and/or diagnosis of SARS-CoV-2 by FDA under an Emergency Use Authorization (EUA).  This EUA will remain in effect (meaning this test can be used) for the duration of the COVID-19 declaration under Section 564(b)(1) of the Act, 21 U.S.C. section 360bbb-3(b)(1), unless the authorization is terminated or revoked sooner.  Performed at [facility], [address] [place], [place] [address]   MRSA Next Gen by PCR, Nasal     Status: Abnormal   Collection Time: 05/12/23  7:53 PM   Specimen: Nasal Mucosa; Nasal Swab  Result Value Ref Range Status   MRSA by PCR Next Gen DETECTED (A) NOT DETECTED Final    Comment: RESULT CALLED TO, READ BACK BY AND  VERIFIED WITH: RN Elby Showers [phone] @ 2143 FH (NOTE) The GeneXpert MRSA Assay (FDA approved for NASAL specimens only), is one component of a comprehensive MRSA colonization surveillance program. It is not intended to diagnose MRSA infection nor to guide or monitor treatment for MRSA infections. Test performance is not FDA approved in patients less than 59 years old. Performed at [facility], [address] [place], [place] [address]          Radiology Studies: CT DBS HEAD W/O CONTRAST ( )  Result Date: 05/12/2023 CLINICAL DATA:  [id number] Malignant frontal lobe tumor (HCC) [id number] EXAM: CT HEAD WITHOUT CONTRAST TECHNIQUE: Contiguous axial images were obtained from the base of the skull through the vertex without intravenous contrast. RADIATION DOSE REDUCTION: This exam was performed according to the departmental dose-optimization program which includes automated exposure control, adjustment of the mA and/or kV according to patient size and/or use of iterative reconstruction technique. COMPARISON:  MRI and CT head 05/05/2023. FINDINGS: Brain: Redemonstrated heterogeneous right frontal mass with suspected mild improvement in surrounding edema and mass effect. Approximately 5 mm of leftward midline shift. No evidence of mass occupying acute hemorrhage, acute large vascular territory infarct or hydrocephalus. Vascular: No hyperdense vessel. Skull: No acute fracture. Sinuses/Orbits: Left the clear sinuses.  No acute orbital findings. Other: No mastoid effusions. IMPRESSION: Redemonstrated heterogeneous right frontal mass with suspected mild improvement in surrounding edema and mass effect. Approximately 5 mm of leftward midline shift. Electronically Signed   By: Feliberto Harts M.D.   On: 05/12/2023 14:33        Scheduled Meds:  sodium chloride   Intravenous Once   atorvastatin  10 mg Oral QHS   carvedilol  12.5 mg Oral BID WC   Chlorhexidine Gluconate Cloth  6 each Topical Daily    cloNIDine  0.2 mg Oral BID   dexamethasone (DECADRON) injection  4 mg Intravenous Q6H   docusate sodium  100 mg Oral BID   insulin aspart  0-20 Units Subcutaneous Q4H   insulin glargine-yfgn  16 Units Subcutaneous Daily   lisinopril  20 mg Oral BID   melatonin  5 mg Oral QHS   mupirocin ointment  1 Application Nasal BID   pantoprazole (PROTONIX) IV  40 mg Intravenous QHS   Continuous Infusions:  sodium chloride 75 mL/hr at 05/13/23 0700   levETIRAcetam Stopped (05/12/23 2149)     LOS: 8 days    Time spent: 35 minutes    Izaiah Tabb A Herson Prichard, MD [facility]   If 7PM-7AM,  please contact night-coverage [email or web address]  05/13/2023, 8:01 AM

## 2023-05-14 DIAGNOSIS — G9340 Encephalopathy, unspecified: Secondary | ICD-10-CM | POA: Diagnosis not present

## 2023-05-14 LAB — BASIC METABOLIC PANEL
Anion gap: 7 (ref 5–15)
BUN: 30 mg/dL — ABNORMAL HIGH (ref 8–23)
CO2: 24 mmol/L (ref 22–32)
Calcium: 8.1 mg/dL — ABNORMAL LOW (ref 8.9–10.3)
Chloride: 102 mmol/L (ref 98–111)
Creatinine, Ser: 0.98 mg/dL (ref 0.61–1.24)
GFR, Estimated: >60 mL/min (ref >60)
Glucose, Bld: 110 mg/dL — ABNORMAL HIGH (ref 70–99)
Potassium: 4.7 mmol/L (ref 3.5–5.1)
Sodium: 133 mmol/L — ABNORMAL LOW (ref 135–145)

## 2023-05-14 LAB — GLUCOSE, CAPILLARY
Glucose-Capillary: 100 mg/dL — ABNORMAL HIGH (ref 70–99)
Glucose-Capillary: 101 mg/dL — ABNORMAL HIGH (ref 70–99)
Glucose-Capillary: 125 mg/dL — ABNORMAL HIGH (ref 70–99)
Glucose-Capillary: 192 mg/dL — ABNORMAL HIGH (ref 70–99)
Glucose-Capillary: 205 mg/dL — ABNORMAL HIGH (ref 70–99)
Glucose-Capillary: 251 mg/dL — ABNORMAL HIGH (ref 70–99)
Glucose-Capillary: 276 mg/dL — ABNORMAL HIGH (ref 70–99)
Glucose-Capillary: 64 mg/dL — ABNORMAL LOW (ref 70–99)

## 2023-05-14 LAB — CBC
HCT: 35.6 % — ABNORMAL LOW (ref 39.0–52.0)
Hemoglobin: 11.8 g/dL — ABNORMAL LOW (ref 13.0–17.0)
MCH: 30.9 pg (ref 26.0–34.0)
MCHC: 33.1 g/dL (ref 30.0–36.0)
MCV: 93.2 fL (ref 80.0–100.0)
Platelets: 153 10*3/uL (ref 150–400)
RBC: 3.82 MIL/uL — ABNORMAL LOW (ref 4.22–5.81)
RDW: 14.6 % (ref 11.5–15.5)
WBC: 24.8 10*3/uL — ABNORMAL HIGH (ref 4.0–10.5)
nRBC: 0 % (ref 0.0–0.2)

## 2023-05-14 LAB — MAGNESIUM: Magnesium: 1.9 mg/dL (ref 1.7–2.4)

## 2023-05-14 MED ORDER — DEXAMETHASONE SODIUM PHOSPHATE 4 MG/ML IJ SOLN
4.0000 mg | Freq: Three times a day (TID) | INTRAMUSCULAR | Status: DC
  Administered 2023-05-14 – 2023-05-15 (×3): 4 mg via INTRAVENOUS
  Filled 2023-05-14 (×3): qty 1

## 2023-05-14 MED ORDER — INSULIN ASPART 100 UNIT/ML IJ SOLN
0.0000 [IU] | INTRAMUSCULAR | Status: DC
  Administered 2023-05-14: 3 [IU] via SUBCUTANEOUS
  Administered 2023-05-14 (×2): 5 [IU] via SUBCUTANEOUS
  Administered 2023-05-14 – 2023-05-15 (×2): 2 [IU] via SUBCUTANEOUS
  Administered 2023-05-15: 1 [IU] via SUBCUTANEOUS
  Administered 2023-05-15: 7 [IU] via SUBCUTANEOUS

## 2023-05-14 MED ORDER — DEXAMETHASONE SODIUM PHOSPHATE 4 MG/ML IJ SOLN: 2.0000 mg | Freq: Two times a day (BID) | INTRAMUSCULAR | Status: DC

## 2023-05-14 MED ORDER — DEXAMETHASONE SODIUM PHOSPHATE 4 MG/ML IJ SOLN: 2.0000 mg | Freq: Three times a day (TID) | INTRAMUSCULAR | Status: DC

## 2023-05-14 MED ORDER — LEVETIRACETAM 500 MG PO TABS
500.0000 mg | ORAL_TABLET | Freq: Two times a day (BID) | ORAL | Status: DC
  Administered 2023-05-14 – 2023-05-15 (×2): 500 mg via ORAL
  Filled 2023-05-14 (×2): qty 1

## 2023-05-14 NOTE — Progress Notes (Signed)
    Providing Compassionate, Quality Care - Together   NEUROSURGERY PROGRESS NOTE     S: NAEs o/n.    O: EXAM:  BP 131/63 (BP Location: Right Arm)   Pulse 73   Temp 97.8 F (36.6 C) (Axillary)   Resp 17   Ht 6' (1.829 m)   Wt 85.6 kg   SpO2 94%   BMI 25.59 kg/m     Awake, alert PERRL Speech fluent, appropriate  CNs grossly intact  5/5 BUE/BLE Dressing dried w/ blood, wound not actively draining   ASSESSMENT:  77 y.o. s/p resection R frontal tumor via craniotomy, POD#2    PLAN: -Appropriate for stepdown to progressive -Therapies as tolerated -Ok for dvt ppx SQH today -Continue slow Decadron wean to 2mg  BID, dose to be maintained throughout further oncologic tx.  -Call w/ questions/concerns.   Patrici Ranks, [facility]

## 2023-05-14 NOTE — Progress Notes (Signed)
Physical Therapy Treatment Patient Details Name: Alex Church MRN: [id number] DOB: 09-08-1946 Today's Date: 05/14/2023   History of Present Illness Pt is a 77 y/o male presenting 8/27 with worsening 4-day h/o Left sided weakness. 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 Now plan for Right steriostatic frontal craniotomy for tumor resection on 9/3. PMHx:  DM, HTN, HLD.    PT Comments  Patient resting in recliner asleep but easily alerted to sound of therapist entering room. Pt agreeable to mobilize reports feeling well and headache improved from yesterday. Pt required cues for technique with sit<>stand today to power up with bil UE on armrest, CGA to steady with rise and hand transition to RW. Pt ambulated ~125' with RW and min assist fading to close CGA for safety. No significant lean/list Lt with gait but overall continues to have reduced Df on Lt LE and decreased step length on Lt. EOS pt completed seated LE exercises in recliner and agreeable to remain OOB for lunch. Recommendations remain appropriate and acute PT will continue to progress as able.   If plan is discharge home, recommend the following: A little help with walking and/or transfers;Two people to help with bathing/dressing/bathroom;Direct supervision/assist for medications management;Assist for transportation;Help with stairs or ramp for entrance;Supervision due to cognitive status   Can travel by private vehicle        Equipment Recommendations  Rolling walker (2 wheels)    Recommendations for Other Services       Precautions / Restrictions Precautions Precautions: Fall Restrictions Weight Bearing Restrictions: No     Mobility  Bed Mobility               General bed mobility comments: pt OOB in recliner at start and returned to recliner at EOS.    Transfers Overall transfer level: Needs assistance Equipment used: Rolling walker (2 wheels) Transfers: Sit to/from Stand Sit  to Stand: Min assist, Contact guard assist           General transfer comment: close CGA for power up with min cues for technique/hand placement on armrest to initiate. pt steady with hand transition to RW and no posterior lean noted. no cues needed for reach back to control lower to chair and pt backing up to chair fully prior to sitting.    Ambulation/Gait Ambulation/Gait assistance: Min assist, Contact guard assist Gait Distance (Feet): 125 Feet Assistive device: Rolling walker (2 wheels) Gait Pattern/deviations: Step-through pattern, Narrow base of support, Decreased dorsiflexion - right, Decreased dorsiflexion - left, Drifts right/left Gait velocity: decr     General Gait Details: Min assist at start with cues to maintain safe proximity to RW. pt with decreased Lt foot Df noted and decreased step length on Lt as pt fatigued. no significat list to Lt today and no overt LOB observed today. pt able to scan hallway room numbers to find his with min cues to guide turns in hallway.   Stairs             Wheelchair Mobility     Tilt Bed    Modified Rankin (Stroke Patients Only)       Balance Overall balance assessment: Needs assistance Sitting-balance support: Single extremity supported, Bilateral upper extremity supported, Feet supported Sitting balance-Leahy Scale: Fair     Standing balance support: During functional activity, No upper extremity supported Standing balance-Leahy Scale: Poor Standing balance comment: benefits from RW for dynamic balance, decreased balance when engaged in cognitive and functional tasks  Cognition Arousal: Alert Behavior During Therapy: WFL for tasks assessed/performed Overall Cognitive Status: Impaired/Different from baseline Area of Impairment: Problem solving, Attention, Safety/judgement                   Current Attention Level: Selective     Safety/Judgement: Decreased awareness of  safety Awareness: Emergent Problem Solving: Slow processing, Requires verbal cues General Comments: Pt very cooperative but requires increased cues for sequencing and safety. as complexity of task increases, balance decreases as well as safety awareness and processing.        Exercises General Exercises - Lower Extremity Long Arc Quad: AROM, Both, 15 reps, Seated Hip Flexion/Marching: AROM, Both, 15 reps, Seated Toe Raises: AROM, Both, 15 reps, Seated Heel Raises: AROM, Both, 15 reps, Seated    General Comments General comments (skin integrity, edema, etc.): VSS      Pertinent Vitals/Pain Pain Assessment Pain Assessment: Faces Faces Pain Scale: No hurt Pain Location: head Pain Descriptors / Indicators: Headache, Sore Pain Intervention(s): Limited activity within patient's tolerance, Monitored during session, Repositioned    Home Living                          Prior Function            PT Goals (current goals can now be found in the care plan section) Acute Rehab PT Goals PT Goal Formulation: With patient Time For Goal Achievement: 05/22/23 Potential to Achieve Goals: Good Progress towards PT goals: Progressing toward goals    Frequency    Min 1X/week      PT Plan      Co-evaluation              AM-PAC PT "6 Clicks" Mobility   Outcome Measure  Help needed turning from your back to your side while in a flat bed without using bedrails?: A Little Help needed moving from lying on your back to sitting on the side of a flat bed without using bedrails?: A Little Help needed moving to and from a bed to a chair (including a wheelchair)?: A Little Help needed standing up from a chair using your arms (e.g., wheelchair or bedside chair)?: A Little Help needed to walk in hospital room?: A Little Help needed climbing 3-5 steps with a railing? : A Lot 6 Click Score: 17    End of Session Equipment Utilized During Treatment: Gait belt Activity  Tolerance: Patient tolerated treatment well Patient left: in chair;with call bell/phone within reach;with chair alarm set;with family/visitor present Nurse Communication: Mobility status PT Visit Diagnosis: Unsteadiness on feet (R26.81);Other symptoms and signs involving the nervous system (R29.898)     Time: 1610-9604 PT Time Calculation (min) (ACUTE ONLY): 18 min  Charges:    $Gait Training: 8-22 mins PT General Charges $$ ACUTE PT VISIT: 1 Visit                     Wynn Maudlin, DPT Acute Rehabilitation Services Office [phone]  05/14/23 12:30 PM

## 2023-05-14 NOTE — Progress Notes (Signed)
Pt transferred to 4N-04 from 4N ICU. A&O x 4. Denies pain at this time. Pt with cell phone at bedside wife has other belongings. Assessment and VS documented. Petechiae rash noted on chest and bilat arms. Dr. Sunnie Nielsen notified no new orders. Pt oriented to unit. Call bell within reach, bed alarm in place.

## 2023-05-14 NOTE — Progress Notes (Signed)
IP rehab admissions - Case opened with [employer] to request acute inpatient rehab admission.  Will await call back from case manager and then update all.  Call me for questions.  [phone]

## 2023-05-14 NOTE — Progress Notes (Signed)
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  Neurosurgery and neuro-oncology consulted.  Patient underwent craniotomy excision of the brain mass on 9/3 by Dr. Jake Samples.    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 Jake Samples  9/3 -Patient was started on Decadron, weaning decadron per neurosurgery. Will keep him on PPI for GI prophylaxis.  -Day 2 post sx, management per Neurosurgery.  -IV keppra IV BID>   Acute metabolic encephalopathy: Secondary to brain mass.  He is alert and oriented. Improved.  TSH/ B12 stable   Diabetes type 2 with uncontrolled hyperglycemia due to steroids On Semglee and SSI.   Hypertension: On carvedilol, clonidine, lisinopril.   Hyperlipidemia: On Lipitor.   Metabolic acidosis; continue with IV fluids.  Leukocytosis; post sx, and in setting of steroids. Monitor.   Debility deconditioning: needs PT. Hopefully CIA>   Hypomagnesemia; replaced.   Nausea, vomiting. Suspect related to brain mass. Post sx.  CT abdomen pelvis negative for obstruction on admission.  IV protonix.  IV PRN phenergan.  Resolved.      Estimated body mass index is 25.59 kg/m as calculated from the following:   Height as of this encounter: 6' (1.829  m).   Weight as of this encounter: 85.6 kg.   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 8/28>> CT abdomen/pelvis: No evidence of primary neoplasm in chest/abdomen/pelvis.    Antimicrobials:    Subjective: He is alert, denies worsening pain.  He has been able to eat, passing gas. No further vomiting.   Objective: Vitals:   05/14/23 0700 05/14/23 0730 05/14/23 0800 05/14/23 1200  BP: 126/62 131/63    Pulse: 74 73    Resp: 17 17    Temp:   97.8 F (36.6 C) 98.4 F (36.9 C)  TempSrc:  Axillary Axillary Oral  SpO2: 95% 94%    Weight:      Height:        Intake/Output Summary (Last 24 hours) at 05/14/2023 1331 Last data filed at 05/14/2023 0700 Gross per 24 hour  Intake 1619.73 ml  Output 900 ml  Net 719.73 ml   Filed Weights   05/12/23 0500 05/13/23 0500 05/14/23 0424  Weight: 88.1 kg 85.4 kg 85.6 kg    Examination:  General exam: NAD Respiratory system: CTA Cardiovascular system: S 1, S 2 RRR Gastrointestinal system: BS present, soft, nt  Central nervous system: Alert, oriented, follows command  Staples frontal area.  Extremities: No edema   Data Reviewed: I have personally reviewed following labs and imaging studies  CBC: Recent Labs  Lab 05/09/23 0408 05/11/23 0305 05/12/23 1436 05/13/23 0500 05/14/23 0625  WBC 14.7* 14.7*  --  30.0* 24.8*  HGB 12.5* 12.5* 12.9* 13.6 11.8*  HCT 38.1* 38.3* 38.0* 40.4 35.6*  MCV 92.3 91.0  --  89.6 93.2   PLT 198 197  --  253 153   Basic Metabolic Panel: Recent Labs  Lab 05/09/23 0408 05/11/23 0305 05/12/23 1436 05/12/23 1844 05/13/23 0500 05/14/23 0625  NA 132* 131* 135 134* 136 133*  K 4.0 4.2 4.3 4.3 4.1 4.7  CL 101 104  --  103 106 102  CO2 23 20*  --  20* 19* 24  GLUCOSE 260* 180*  --  190* 118* 110*  BUN 32* 39*  --  38* 37* 30*  CREATININE 1.18 1.23  --  1.01 0.97 0.98  CALCIUM 8.6* 8.4*  --  8.0* 8.0* 8.1*  MG  --   --   --   --  1.8 1.9   GFR: Estimated Creatinine Clearance: 70.4 mL/min (by C-G formula based on SCr of 0.98 mg/dL). Liver Function Tests: Recent Labs  Lab 05/11/23 0305  AST 33  ALT 73*  ALKPHOS 51  BILITOT 0.7  PROT 5.5*  ALBUMIN 2.7*   No results for input(s): "LIPASE", "AMYLASE" in the last 168 hours. No results for input(s): "AMMONIA" in the last 168 hours.  Coagulation Profile: No results for input(s): "INR", "PROTIME" in the last 168 hours.  Cardiac Enzymes: No results for input(s): "CKTOTAL", "CKMB", "CKMBINDEX", "TROPONINI" in the last 168 hours.  BNP (last 3 results) No results for input(s): "PROBNP" in the last 8760 hours. HbA1C: No results for input(s): "HGBA1C" in the last 72 hours. CBG: Recent Labs  Lab 05/14/23 0002 05/14/23 0028 05/14/23 0356 05/14/23 0815 05/14/23 1210  GLUCAP 64* 101* 100* 125* 192*   Lipid Profile: No results for input(s): "CHOL", "HDL", "LDLCALC", "TRIG", "CHOLHDL", "LDLDIRECT" in the last 72 hours. Thyroid Function Tests: No results for input(s): "TSH", "T4TOTAL", "FREET4", "T3FREE", "THYROIDAB" in the last 72 hours. Anemia Panel: No results for input(s): "VITAMINB12", "FOLATE", "FERRITIN", "TIBC", "IRON", "RETICCTPCT" in the last 72 hours. Sepsis Labs: No results for input(s): "PROCALCITON", "LATICACIDVEN" in the last 168 hours.  Recent Results (from the past 240 hour(s))  SARS Coronavirus 2 by RT PCR (hospital order, performed in [facility] hospital lab) *cepheid single result test*  Anterior Nasal Swab     Status: None   Collection Time: 05/05/23  1:01 PM   Specimen: Anterior Nasal Swab  Result Value Ref Range Status   SARS Coronavirus 2 by RT PCR NEGATIVE NEGATIVE Final    Comment: (NOTE) SARS-CoV-2 target nucleic acids are NOT DETECTED.  The SARS-CoV-2 RNA is generally detectable in upper and lower respiratory specimens during the acute phase of infection. The lowest concentration of SARS-CoV-2 viral copies this assay can detect is 250 copies / mL. A negative result does not preclude SARS-CoV-2 infection and should not be used as the sole basis for treatment or other patient management decisions.  A negative result may occur with improper specimen collection / handling, submission of specimen other than nasopharyngeal swab, presence of viral mutation(s) within the areas targeted by this assay, and inadequate number of viral copies (<250 copies / mL). A negative result must be combined with clinical observations, patient history,  and epidemiological information.  Fact Sheet for Patients:   RoadLapTop.co.za  Fact Sheet for Healthcare Providers: http://kim-miller.com/  This test is not yet approved or  cleared by the [place] FDA and has been authorized for detection and/or diagnosis of SARS-CoV-2 by FDA under an Emergency Use Authorization (EUA).  This EUA will remain in effect (meaning this test can be used) for the duration of the COVID-19 declaration under Section 564(b)(1) of the Act, 21 U.S.C. section 360bbb-3(b)(1), unless the authorization is terminated or revoked sooner.  Performed at [facility], [address] [place], [place] [address]   MRSA Next Gen by PCR, Nasal     Status: Abnormal   Collection Time: 05/12/23  7:53 PM   Specimen: Nasal Mucosa; Nasal Swab  Result Value Ref Range Status   MRSA by PCR Next Gen DETECTED (A) NOT DETECTED Final    Comment: RESULT CALLED TO, READ BACK  BY AND VERIFIED WITH: RN Elby Showers [phone] @ 2143 FH (NOTE) The GeneXpert MRSA Assay (FDA approved for NASAL specimens only), is one component of a comprehensive MRSA colonization surveillance program. It is not intended to diagnose MRSA infection nor to guide or monitor treatment for MRSA infections. Test performance is not FDA approved in patients less than 77 years old. Performed at [facility], [address], [place], [place] [address]          Radiology Studies: MR BRAIN W WO CONTRAST  Result Date: 05/13/2023 CLINICAL DATA:  Craniotomy, post-op EXAM: MRI HEAD WITHOUT AND WITH CONTRAST TECHNIQUE: Multiplanar, multiecho pulse sequences of the brain and surrounding structures were obtained without and with intravenous contrast. CONTRAST:  8mL GADAVIST GADOBUTROL 1 MMOL/ML IV SOLN COMPARISON:  MRI head 05/05/2023. FINDINGS: Brain: Interval debulking of a right frontal lesion appears larger section cavity with expected postoperative blood products in the resection cavity as well as overlying extra-axial spaces. Small to moderate volume of pneumocephalus antidependently along the frontal convexities anteriorly. Surrounding T2/FLAIR hyperintensities is mildly improved and mass effect is mildly improved. Leftward midline shift now measures approximately 4 mm anteriorly. No suspicious residual enhancement, although postoperative left limits assessment. Mm continued attention on follow-up. No evidence of acute infarct or hydrocephalus. Vascular: Major arterial flow voids are maintained at the skull base. Skull and upper cervical spine: Normal marrow signal. Sinuses/Orbits: Clear sinuses.  No acute orbital findings. Other: No mastoid effusions. IMPRESSION: Expected postoperative findings after debulking of a large right frontal mass, detailed above. Electronically Signed   By: Feliberto Harts M.D.   On: 05/13/2023 13:12        Scheduled Meds:  sodium chloride   Intravenous Once    atorvastatin  10 mg Oral QHS   carvedilol  12.5 mg Oral BID WC   Chlorhexidine Gluconate Cloth  6 each Topical Daily   cloNIDine  0.2 mg Oral BID   dexamethasone (DECADRON) injection  4 mg Intravenous Q8H   Followed by   [START ON 05/18/2023] dexamethasone (DECADRON) injection  2 mg Intravenous Q8H   Followed by   [START ON 05/22/2023] dexamethasone (DECADRON) injection  2 mg Intravenous Q12H   docusate sodium  100 mg Oral BID   heparin injection (subcutaneous)  5,000 Units Subcutaneous Q8H   insulin aspart  0-9 Units Subcutaneous Q4H   insulin glargine-yfgn  16 Units Subcutaneous Daily   lisinopril  20 mg Oral BID   melatonin  5 mg Oral QHS   mupirocin ointment  1 Application Nasal BID  pantoprazole (PROTONIX) IV  40 mg Intravenous Q12H   Continuous Infusions:  sodium chloride 100 mL/hr at 05/14/23 0725   levETIRAcetam 500 mg (05/14/23 0815)     LOS: 9 days    Time spent: 35 minutes    Cori Justus A Khelani Kops, MD [facility]   If 7PM-7AM, please contact night-coverage [email or web address]  05/14/2023, 1:31 PM

## 2023-05-14 NOTE — Progress Notes (Signed)
Occupational Therapy Treatment Patient Details Name: Alex Church MRN: [id number] DOB: 09-17-1945 Today's Date: 05/14/2023   History of present illness Pt is a 77 y/o male presenting 8/27 with worsening 4-day h/o Left sided weakness. 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 Now plan for Right steriostatic frontal craniotomy for tumor resection on 9/3. PMHx:  DM, HTN, HLD.   OT comments  Pt making excellent progress towards OT goals this session. Deficits in balance, cognition, coordination (LUE especially), activity tolerance remain - but are slowly improving. Today Pt is motivated and cooperative demonstrating min A for transfers with RW, sink level grooming at min A, toilet transfer and peri care at min A. POV remains appropriate. OT will continue to follow acutely. Next session to focus more on LUE deficits as well as cognitive (higher level) tasks. Note: as complexity of task increases, balance and safety decrease.       If plan is discharge home, recommend the following:  Assistance with cooking/housework;Direct supervision/assist for medications management;Direct supervision/assist for financial management;Assist for transportation;Help with stairs or ramp for entrance;A little help with walking and/or transfers;A little help with bathing/dressing/bathroom   Equipment Recommendations  Other (comment) (defer to next venue)    Recommendations for Other Services      Precautions / Restrictions Precautions Precautions: Fall Restrictions Weight Bearing Restrictions: No       Mobility Bed Mobility Overal bed mobility: Needs Assistance Bed Mobility: Supine to Sit     Supine to sit: Supervision     General bed mobility comments: use of rails to assist, supervision for line management and safety    Transfers Overall transfer level: Needs assistance Equipment used: Rolling walker (2 wheels) Transfers: Sit to/from Stand Sit to Stand: Min  assist, Contact guard assist           General transfer comment: min A for initial boost, progressing to GCA. Min A for LOB with complexity of task increasing     Balance Overall balance assessment: Needs assistance Sitting-balance support: Single extremity supported, Bilateral upper extremity supported, Feet supported Sitting balance-Leahy Scale: Fair     Standing balance support: During functional activity, No upper extremity supported Standing balance-Leahy Scale: Poor Standing balance comment: benefits from RW for dynamic balance, decreased balance when engaged in cognitive and functional tasks                           ADL either performed or assessed with clinical judgement   ADL Overall ADL's : Needs assistance/impaired     Grooming: Contact guard assist;Standing;Oral care;Wash/dry face;Wash/dry hands Grooming Details (indicate cue type and reason): able to find objects in bag and use appropriately, extra time and decreased balance while complexity of task increased. cues for safety and awareness     Lower Body Bathing: Contact guard assist;Sit to/from stand Lower Body Bathing Details (indicate cue type and reason): for peri area front and back Upper Body Dressing : Set up;Sitting Upper Body Dressing Details (indicate cue type and reason): new gown     Toilet Transfer: Minimal assistance;Rolling walker (2 wheels);Ambulation Toilet Transfer Details (indicate cue type and reason): cues for safety with RW, min A for boost into standing Toileting- Clothing Manipulation and Hygiene: Minimal assistance;Sit to/from stand Toileting - Clothing Manipulation Details (indicate cue type and reason): front and back peri care     Functional mobility during ADLs: Minimal assistance;Rolling walker (2 wheels)      Extremity/Trunk  Assessment Upper Extremity Assessment Upper Extremity Assessment: Right hand dominant;LUE deficits/detail LUE Deficits / Details: decr strength  as compared to R. Decreased coordination with finger to nose especially once near therapist finger.   Lower Extremity Assessment Lower Extremity Assessment: Defer to PT evaluation        Vision   Vision Assessment?: No apparent visual deficits Additional Comments: Pt reports that he was talking to MD about cataract sx   Perception     Praxis      Cognition Arousal: Alert Behavior During Therapy: WFL for tasks assessed/performed Overall Cognitive Status: Impaired/Different from baseline Area of Impairment: Problem solving, Attention, Safety/judgement                   Current Attention Level: Selective     Safety/Judgement: Decreased awareness of safety Awareness: Emergent Problem Solving: Slow processing, Requires verbal cues General Comments: Pt very cooperative but requires increased cues for sequencing and safety. as complexity of task increases, balance decreases as well as safety awareness and processing        Exercises      Shoulder Instructions       General Comments VSS on RA BP at end of session 131/63 HR up to 92 with activity, SpO2 remained >90% throughout session    Pertinent Vitals/ Pain       Pain Assessment Pain Assessment: Faces Faces Pain Scale: Hurts a little bit Pain Location: head Pain Descriptors / Indicators: Headache, Sore Pain Intervention(s): Monitored during session, Repositioned  Home Living                                          Prior Functioning/Environment              Frequency  Min 1X/week        Progress Toward Goals  OT Goals(current goals can now be found in the care plan section)  Progress towards OT goals: Progressing toward goals  Acute Rehab OT Goals Patient Stated Goal: to go to rehab and see his dog Luke OT Goal Formulation: With patient Time For Goal Achievement: 05/23/23 Potential to Achieve Goals: Good  Plan      Co-evaluation                 AM-PAC OT "6  Clicks" Daily Activity     Outcome Measure   Help from another person eating meals?: A Little Help from another person taking care of personal grooming?: A Little Help from another person toileting, which includes using toliet, bedpan, or urinal?: A Little Help from another person bathing (including washing, rinsing, drying)?: A Little Help from another person to put on and taking off regular upper body clothing?: A Little Help from another person to put on and taking off regular lower body clothing?: A Little 6 Click Score: 18    End of Session Equipment Utilized During Treatment: Gait belt;Rolling walker (2 wheels)  OT Visit Diagnosis: Unsteadiness on feet (R26.81);Muscle weakness (generalized) (M62.81);Other symptoms and signs involving cognitive function   Activity Tolerance Patient tolerated treatment well   Patient Left in chair;with call bell/phone within reach;with chair alarm set;with family/visitor present   Nurse Communication Mobility status;Other (comment) (provided with diet coke, found in urine initially)        Time: 1610-9604 OT Time Calculation (min): 40 min  Charges: OT General Charges $OT Visit: 1 Visit OT Treatments $  Self Care/Home Management : 23-37 mins $Therapeutic Activity: 8-22 mins  Nyoka Cowden OTR/L Acute Rehabilitation Services Office: [phone]  Evern Bio [facility] 05/14/2023, 10:00 AM

## 2023-05-14 NOTE — Progress Notes (Signed)
Hypoglycemic Event  CBG: 64  Treatment: 4 oz juice/soda  Symptoms: None  Follow-up CBG: Time:0025 CBG Result:101  Possible Reasons for Event: Unknown

## 2023-05-14 NOTE — Inpatient Diabetes Management (Signed)
Inpatient Diabetes Program Recommendations  AACE/ADA: New Consensus Statement on Inpatient Glycemic Control (2015)  Target Ranges:  Prepandial:   less than 140 mg/dL      Peak postprandial:   less than 180 mg/dL (1-2 hours)      Critically ill patients:  140 - 180 mg/dL   Lab Results  Component Value Date   GLUCAP 125 (H) 05/14/2023   HGBA1C 6.9 (H) 05/06/2023    Latest Reference Range & Units 05/13/23 20:06 05/14/23 00:02 05/14/23 00:28 05/14/23 03:56 05/14/23 08:15  Glucose-Capillary 70 - 99 mg/dL 474 (H) 64 (L) 259 (H) 100 (H) 125 (H)  (H): Data is abnormally high (L): Data is abnormally low  Diabetes history: Type 2 Outpatient Diabetes medications: Januvia 100 mg daily, Metformin 1000 mg BID, Actos 30 mg daily Current orders for Inpatient glycemic control: Semglee 10 units, Novolog 0-20 units q 4 hrs., Decadron 4 mg q 8 hrs, then on 9/9 Decadron 2 mg q 8hrs, then on 9/13 Decadron to 2 mg q 12 hrs.  Inpatient Diabetes Program Recommendations:   As Steroid taper begins, consider: -Decrease Novolog correction to 0-9 units q 4 hrs. -Adjust Semglee on 9/9 when Decadron decreases  Thank you, Darel Hong E. Abygale Karpf, RN, MSN, CDE  Diabetes Coordinator Inpatient Glycemic Control Team Team Pager [phone] (8am-5pm) 05/14/2023 10:47 AM

## 2023-05-15 ENCOUNTER — Other Ambulatory Visit: Payer: Self-pay

## 2023-05-15 ENCOUNTER — Inpatient Hospital Stay (HOSPITAL_COMMUNITY)
Admission: RE | Admit: 2023-05-15 | Discharge: 2023-05-23 | DRG: 555 | Disposition: A | Discharge status: Home / Self Care | Payer: Medicare Other | Source: Intra-hospital | Attending: Physical Medicine & Rehabilitation | Admitting: Physical Medicine & Rehabilitation

## 2023-05-15 ENCOUNTER — Encounter (HOSPITAL_COMMUNITY): Payer: Self-pay | Admitting: Neurological Surgery

## 2023-05-15 DIAGNOSIS — D649 Anemia, unspecified: Secondary | ICD-10-CM | POA: Diagnosis present

## 2023-05-15 DIAGNOSIS — T380X5A Adverse effect of glucocorticoids and synthetic analogues, initial encounter: Secondary | ICD-10-CM | POA: Diagnosis present

## 2023-05-15 DIAGNOSIS — Z79899 Other long term (current) drug therapy: Secondary | ICD-10-CM | POA: Diagnosis not present

## 2023-05-15 DIAGNOSIS — T380X5D Adverse effect of glucocorticoids and synthetic analogues, subsequent encounter: Secondary | ICD-10-CM

## 2023-05-15 DIAGNOSIS — Z833 Family history of diabetes mellitus: Secondary | ICD-10-CM | POA: Diagnosis not present

## 2023-05-15 DIAGNOSIS — D72829 Elevated white blood cell count, unspecified: Secondary | ICD-10-CM | POA: Diagnosis not present

## 2023-05-15 DIAGNOSIS — Z7984 Long term (current) use of oral hypoglycemic drugs: Secondary | ICD-10-CM

## 2023-05-15 DIAGNOSIS — E785 Hyperlipidemia, unspecified: Secondary | ICD-10-CM | POA: Diagnosis not present

## 2023-05-15 DIAGNOSIS — C719 Malignant neoplasm of brain, unspecified: Secondary | ICD-10-CM | POA: Diagnosis present

## 2023-05-15 DIAGNOSIS — D496 Neoplasm of unspecified behavior of brain: Secondary | ICD-10-CM | POA: Diagnosis not present

## 2023-05-15 DIAGNOSIS — G9341 Metabolic encephalopathy: Secondary | ICD-10-CM | POA: Diagnosis not present

## 2023-05-15 DIAGNOSIS — R262 Difficulty in walking, not elsewhere classified: Principal | ICD-10-CM | POA: Diagnosis present

## 2023-05-15 DIAGNOSIS — E871 Hypo-osmolality and hyponatremia: Secondary | ICD-10-CM | POA: Diagnosis present

## 2023-05-15 DIAGNOSIS — B964 Proteus (mirabilis) (morganii) as the cause of diseases classified elsewhere: Secondary | ICD-10-CM | POA: Diagnosis not present

## 2023-05-15 DIAGNOSIS — Z9889 Other specified postprocedural states: Secondary | ICD-10-CM

## 2023-05-15 DIAGNOSIS — R531 Weakness: Secondary | ICD-10-CM | POA: Diagnosis present

## 2023-05-15 DIAGNOSIS — Z8249 Family history of ischemic heart disease and other diseases of the circulatory system: Secondary | ICD-10-CM

## 2023-05-15 DIAGNOSIS — E119 Type 2 diabetes mellitus without complications: Secondary | ICD-10-CM

## 2023-05-15 DIAGNOSIS — E538 Deficiency of other specified B group vitamins: Secondary | ICD-10-CM | POA: Diagnosis not present

## 2023-05-15 DIAGNOSIS — I10 Essential (primary) hypertension: Secondary | ICD-10-CM | POA: Diagnosis not present

## 2023-05-15 DIAGNOSIS — E1165 Type 2 diabetes mellitus with hyperglycemia: Secondary | ICD-10-CM | POA: Diagnosis present

## 2023-05-15 DIAGNOSIS — N390 Urinary tract infection, site not specified: Secondary | ICD-10-CM | POA: Diagnosis not present

## 2023-05-15 DIAGNOSIS — G479 Sleep disorder, unspecified: Secondary | ICD-10-CM | POA: Diagnosis not present

## 2023-05-15 DIAGNOSIS — G9340 Encephalopathy, unspecified: Secondary | ICD-10-CM | POA: Diagnosis not present

## 2023-05-15 DIAGNOSIS — Z794 Long term (current) use of insulin: Secondary | ICD-10-CM

## 2023-05-15 LAB — BASIC METABOLIC PANEL
Anion gap: 11 (ref 5–15)
BUN: 23 mg/dL (ref 8–23)
CO2: 21 mmol/L — ABNORMAL LOW (ref 22–32)
Calcium: 7.8 mg/dL — ABNORMAL LOW (ref 8.9–10.3)
Chloride: 98 mmol/L (ref 98–111)
Creatinine, Ser: 0.87 mg/dL (ref 0.61–1.24)
GFR, Estimated: >60 mL/min (ref >60)
Glucose, Bld: 150 mg/dL — ABNORMAL HIGH (ref 70–99)
Potassium: 4.2 mmol/L (ref 3.5–5.1)
Sodium: 130 mmol/L — ABNORMAL LOW (ref 135–145)

## 2023-05-15 LAB — GLUCOSE, CAPILLARY
Glucose-Capillary: 173 mg/dL — ABNORMAL HIGH (ref 70–99)
Glucose-Capillary: 146 mg/dL — ABNORMAL HIGH (ref 70–99)
Glucose-Capillary: 308 mg/dL — ABNORMAL HIGH (ref 70–99)
Glucose-Capillary: 147 mg/dL — ABNORMAL HIGH (ref 70–99)
Glucose-Capillary: 226 mg/dL — ABNORMAL HIGH (ref 70–99)

## 2023-05-15 LAB — CBC
HCT: 35 % — ABNORMAL LOW (ref 39.0–52.0)
Hemoglobin: 11.5 g/dL — ABNORMAL LOW (ref 13.0–17.0)
MCH: 29.6 pg (ref 26.0–34.0)
MCHC: 32.9 g/dL (ref 30.0–36.0)
MCV: 90.2 fL (ref 80.0–100.0)
Platelets: 161 10*3/uL (ref 150–400)
RBC: 3.88 MIL/uL — ABNORMAL LOW (ref 4.22–5.81)
RDW: 14.5 % (ref 11.5–15.5)
WBC: 25.4 10*3/uL — ABNORMAL HIGH (ref 4.0–10.5)
nRBC: 0 % (ref 0.0–0.2)

## 2023-05-15 MED ORDER — CLONIDINE HCL 0.1 MG PO TABS
0.2000 mg | ORAL_TABLET | Freq: Two times a day (BID) | ORAL | Status: DC
  Administered 2023-05-15 – 2023-05-20 (×10): 0.2 mg via ORAL
  Filled 2023-05-15 (×10): qty 2

## 2023-05-15 MED ORDER — PANTOPRAZOLE SODIUM 40 MG PO TBEC
40.0000 mg | DELAYED_RELEASE_TABLET | Freq: Two times a day (BID) | ORAL | Status: DC
  Administered 2023-05-15 – 2023-05-23 (×16): 40 mg via ORAL
  Filled 2023-05-15 (×16): qty 1

## 2023-05-15 MED ORDER — PANTOPRAZOLE SODIUM 40 MG PO TBEC: 40.0000 mg | DELAYED_RELEASE_TABLET | Freq: Two times a day (BID) | ORAL | 0 refills | Status: DC

## 2023-05-15 MED ORDER — CHLORHEXIDINE GLUCONATE CLOTH 2 % EX PADS
6.0000 | MEDICATED_PAD | Freq: Every day | CUTANEOUS | Status: DC
  Administered 2023-05-16 – 2023-05-23 (×6): 6 via TOPICAL

## 2023-05-15 MED ORDER — HYDROCODONE-ACETAMINOPHEN 5-325 MG PO TABS
1.0000 | ORAL_TABLET | ORAL | Status: DC | PRN
  Administered 2023-05-17 – 2023-05-21 (×3): 1 via ORAL
  Filled 2023-05-15 (×3): qty 1

## 2023-05-15 MED ORDER — INSULIN GLARGINE-YFGN 100 UNIT/ML ~~LOC~~ SOLN
16.0000 [IU] | Freq: Every day | SUBCUTANEOUS | Status: DC
  Administered 2023-05-16: 16 [IU] via SUBCUTANEOUS
  Filled 2023-05-15 (×2): qty 0.16

## 2023-05-15 MED ORDER — INSULIN ASPART 100 UNIT/ML IJ SOLN
0.0000 [IU] | Freq: Three times a day (TID) | INTRAMUSCULAR | Status: DC
  Administered 2023-05-15: 1 [IU] via SUBCUTANEOUS
  Administered 2023-05-15: 3 [IU] via SUBCUTANEOUS
  Administered 2023-05-16: 5 [IU] via SUBCUTANEOUS
  Administered 2023-05-16: 3 [IU] via SUBCUTANEOUS
  Administered 2023-05-16 (×2): 7 [IU] via SUBCUTANEOUS
  Administered 2023-05-17: 3 [IU] via SUBCUTANEOUS
  Administered 2023-05-17: 9 [IU] via SUBCUTANEOUS
  Administered 2023-05-17 (×2): 3 [IU] via SUBCUTANEOUS
  Administered 2023-05-18: 2 [IU] via SUBCUTANEOUS
  Administered 2023-05-18: 1 [IU] via SUBCUTANEOUS
  Administered 2023-05-18: 3 [IU] via SUBCUTANEOUS
  Administered 2023-05-18 – 2023-05-19 (×2): 2 [IU] via SUBCUTANEOUS
  Administered 2023-05-19: 1 [IU] via SUBCUTANEOUS
  Administered 2023-05-20: 3 [IU] via SUBCUTANEOUS
  Administered 2023-05-20: 1 [IU] via SUBCUTANEOUS
  Administered 2023-05-20: 3 [IU] via SUBCUTANEOUS
  Administered 2023-05-20: 5 [IU] via SUBCUTANEOUS
  Administered 2023-05-21 – 2023-05-22 (×4): 2 [IU] via SUBCUTANEOUS
  Administered 2023-05-22: 1 [IU] via SUBCUTANEOUS

## 2023-05-15 MED ORDER — METHOCARBAMOL 500 MG PO TABS: 500.0000 mg | ORAL_TABLET | Freq: Four times a day (QID) | ORAL | Status: DC | PRN

## 2023-05-15 MED ORDER — INSULIN GLARGINE-YFGN 100 UNIT/ML ~~LOC~~ SOLN: 16.0000 [IU] | Freq: Every day | SUBCUTANEOUS | 11 refills | Status: DC

## 2023-05-15 MED ORDER — MUPIROCIN 2 % EX OINT
1.0000 | TOPICAL_OINTMENT | Freq: Two times a day (BID) | CUTANEOUS | Status: AC
  Administered 2023-05-15 – 2023-05-17 (×4): 1 via NASAL
  Filled 2023-05-15: qty 22

## 2023-05-15 MED ORDER — ACETAMINOPHEN 325 MG PO TABS
325.0000 mg | ORAL_TABLET | ORAL | Status: DC | PRN
  Administered 2023-05-19: 650 mg via ORAL
  Filled 2023-05-15: qty 2

## 2023-05-15 MED ORDER — LEVETIRACETAM 250 MG PO TABS
500.0000 mg | ORAL_TABLET | Freq: Two times a day (BID) | ORAL | Status: DC
  Administered 2023-05-15 – 2023-05-23 (×16): 500 mg via ORAL
  Filled 2023-05-15 (×16): qty 2

## 2023-05-15 MED ORDER — DEXAMETHASONE SODIUM PHOSPHATE 4 MG/ML IJ SOLN: 2.0000 mg | Freq: Two times a day (BID) | INTRAMUSCULAR | Status: DC

## 2023-05-15 MED ORDER — DEXAMETHASONE SODIUM PHOSPHATE 4 MG/ML IJ SOLN
4.0000 mg | Freq: Three times a day (TID) | INTRAMUSCULAR | Status: AC
  Administered 2023-05-15 – 2023-05-18 (×9): 4 mg via INTRAVENOUS
  Filled 2023-05-15 (×9): qty 1

## 2023-05-15 MED ORDER — MELATONIN 5 MG PO TABS: 5.0000 mg | ORAL_TABLET | Freq: Every day | ORAL | 0 refills | Status: DC

## 2023-05-15 MED ORDER — LISINOPRIL 20 MG PO TABS
20.0000 mg | ORAL_TABLET | Freq: Two times a day (BID) | ORAL | Status: DC
  Administered 2023-05-15 – 2023-05-16 (×2): 20 mg via ORAL
  Filled 2023-05-15 (×2): qty 1

## 2023-05-15 MED ORDER — FLEET ENEMA RE ENEM: 1.0000 | ENEMA | Freq: Once | RECTAL | Status: DC | PRN

## 2023-05-15 MED ORDER — LEVETIRACETAM 500 MG PO TABS: 500.0000 mg | ORAL_TABLET | Freq: Two times a day (BID) | ORAL | 0 refills | Status: DC

## 2023-05-15 MED ORDER — HEPARIN SODIUM (PORCINE) 5000 UNIT/ML IJ SOLN
5000.0000 [IU] | Freq: Three times a day (TID) | INTRAMUSCULAR | Status: DC
  Administered 2023-05-15 – 2023-05-23 (×22): 5000 [IU] via SUBCUTANEOUS
  Filled 2023-05-15 (×22): qty 1

## 2023-05-15 MED ORDER — POLYETHYLENE GLYCOL 3350 17 G PO PACK: 17.0000 g | PACK | Freq: Every day | ORAL | 0 refills | Status: DC | PRN

## 2023-05-15 MED ORDER — BISACODYL 5 MG PO TBEC: 5.0000 mg | DELAYED_RELEASE_TABLET | Freq: Every day | ORAL | Status: DC | PRN

## 2023-05-15 MED ORDER — HEPARIN SODIUM (PORCINE) 5000 UNIT/ML IJ SOLN: 5000.0000 [IU] | Freq: Three times a day (TID) | INTRAMUSCULAR | Status: DC

## 2023-05-15 MED ORDER — POLYETHYLENE GLYCOL 3350 17 G PO PACK: 17.0000 g | PACK | Freq: Every day | ORAL | Status: DC | PRN

## 2023-05-15 MED ORDER — ONDANSETRON HCL 4 MG PO TABS: 4.0000 mg | ORAL_TABLET | Freq: Four times a day (QID) | ORAL | Status: DC | PRN

## 2023-05-15 MED ORDER — ALUM & MAG HYDROXIDE-SIMETH 200-200-20 MG/5ML PO SUSP: 30.0000 mL | ORAL | Status: DC | PRN

## 2023-05-15 MED ORDER — ONDANSETRON HCL 4 MG/2ML IJ SOLN
4.0000 mg | Freq: Four times a day (QID) | INTRAMUSCULAR | Status: DC | PRN
  Filled 2023-05-15: qty 2

## 2023-05-15 MED ORDER — CARVEDILOL 12.5 MG PO TABS
12.5000 mg | ORAL_TABLET | Freq: Two times a day (BID) | ORAL | Status: DC
  Administered 2023-05-15 – 2023-05-20 (×10): 12.5 mg via ORAL
  Filled 2023-05-15 (×10): qty 1

## 2023-05-15 MED ORDER — ATORVASTATIN CALCIUM 10 MG PO TABS
10.0000 mg | ORAL_TABLET | Freq: Every day | ORAL | Status: DC
  Administered 2023-05-15 – 2023-05-22 (×8): 10 mg via ORAL
  Filled 2023-05-15 (×8): qty 1

## 2023-05-15 MED ORDER — DEXAMETHASONE SODIUM PHOSPHATE 4 MG/ML IJ SOLN: INTRAMUSCULAR | 0 refills | Status: DC

## 2023-05-15 MED ORDER — MELATONIN 5 MG PO TABS
5.0000 mg | ORAL_TABLET | Freq: Every day | ORAL | Status: DC
  Administered 2023-05-15 – 2023-05-17 (×3): 5 mg via ORAL
  Filled 2023-05-15 (×3): qty 1

## 2023-05-15 MED ORDER — PANTOPRAZOLE SODIUM 40 MG PO TBEC
40.0000 mg | DELAYED_RELEASE_TABLET | Freq: Two times a day (BID) | ORAL | Status: DC
  Administered 2023-05-15: 40 mg via ORAL
  Filled 2023-05-15: qty 1

## 2023-05-15 MED ORDER — DOCUSATE SODIUM 100 MG PO CAPS
100.0000 mg | ORAL_CAPSULE | Freq: Two times a day (BID) | ORAL | Status: DC
  Administered 2023-05-15 – 2023-05-23 (×16): 100 mg via ORAL
  Filled 2023-05-15 (×16): qty 1

## 2023-05-15 MED ORDER — DEXAMETHASONE SODIUM PHOSPHATE 4 MG/ML IJ SOLN
2.0000 mg | Freq: Three times a day (TID) | INTRAMUSCULAR | Status: DC
  Administered 2023-05-18 – 2023-05-21 (×8): 2 mg via INTRAVENOUS
  Filled 2023-05-15 (×10): qty 0.5

## 2023-05-15 MED ORDER — GUAIFENESIN-DM 100-10 MG/5ML PO SYRP: 10.0000 mL | ORAL_SOLUTION | Freq: Four times a day (QID) | ORAL | Status: DC | PRN

## 2023-05-15 MED ORDER — ALLOPURINOL 100 MG PO TABS
100.0000 mg | ORAL_TABLET | Freq: Two times a day (BID) | ORAL | Status: DC
  Administered 2023-05-15 – 2023-05-23 (×16): 100 mg via ORAL
  Filled 2023-05-15 (×16): qty 1

## 2023-05-15 NOTE — H&P (Signed)
Physical Medicine and Rehabilitation Admission H&P     CC: Functional deficits secondary to glial neoplasm   HPI: Alex Church is a 77 year old male with was admitted to [facility] on 05/05/2023 by way of transfer from [facility] with new diagnosis of right frontal brain mass after presenting from home to [facility] ED complaining of left-sided weakness.  The patient presents with complaint of 4 days of left-sided weakness involving the left upper and lower extremity, starting on Saturday, 05/02/2023.  Denied any associated acute focal numbness or paresthesias nor any acute vertigo, dysphagia, dysarthria, facial droop, acute change in vision. The patient's family also conveyed that the patient had appeared progressively confused over the last few months, starting in May 2024.  Over that time, they had noticed the patient becoming more forgetful, getting lost while driving, although very familiar with these routes. MRI brain with and without contrast showed large mass located within the right frontal lobe measuring 6.8 x 4.9 cm associate with mass effect on the right lateral ventricle and 6 mm of leftward midline shift in the absence of any evidence of acute infarct in the absence of any evidence of intracranial hemorrhage. EDP at [facility] d/w on-call neurosurgeon, Dr. Jake Samples , who recommended TRH admission to [facility]. Dr. Jake Samples conveyed that neurosurgery would formally consult, and did not feel that emergent surgery is indicated.  Rather, neurosurgery anticipated surgical intervention on Thursday, 05/07/2023, and recommends interval Decadron 4 mg every 6 hours. The patient underwent right stereotactic frontal craniotomy for resection of tumor by Dr. Jake Samples. On exam 9/05, speech fluent, CNs grossly intact, 5/5 BUE/BLE. Remains on steroid taper and on Keppra. Tolerating diet. He ambulated ~125' with RW and min assist fading to close CGA for safety. No significant lean/list Lt with gait but overall continues to  have reduced Df on Lt LE and decreased step length on Lt. Patient reports he has not been sleeping well and feels tired during the day.  He has chronic issues with sleep.  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   Lives with his significant other in a two-story home.  3 steps to enter the home.  Bedroom is upstairs but could potentially make bedroom downstairs if required.  His significant other plans to go back to work but sister can assist during the day.     Review of Systems  Constitutional:  Negative for chills and fever.  HENT:  Negative for hearing loss.   Eyes:        Cataract  Respiratory:  Negative for shortness of breath.   Cardiovascular:  Negative for chest pain.  Gastrointestinal:  Negative for abdominal pain, constipation, diarrhea, nausea and vomiting.  Genitourinary: Negative.   Musculoskeletal:  Negative for joint pain.  Neurological:  Negative for sensory change, speech change and weakness.  Psychiatric/Behavioral:  The patient has insomnia.         Past Medical History:  Diagnosis Date   B12 deficiency      monthly shots   Diabetes mellitus     Glaucoma suspect     Hyperlipemia     Hypertension               Past Surgical History:  Procedure Laterality Date   APPLICATION OF CRANIAL NAVIGATION Right 05/12/2023    Procedure: APPLICATION OF CRANIAL NAVIGATION;  Surgeon: Dawley, Alan Mulder, DO;  Location: [facility] OR;  Service: Neurosurgery;  Laterality: Right;   CRANIOTOMY Right 05/12/2023    Procedure: RIGHT STERIOSTACTIC FRONTAL CRANIOTOMY FOR TUMOR RESECTION;  Surgeon: Bethann Goo, DO;  Location: [facility] OR;  Service: Neurosurgery;  Laterality: Right;   NO PAST SURGERIES                 Family History  Problem Relation Age of Onset   Diabetes Maternal Uncle     Heart attack Brother 110   Cancer Mother          intra-abdominal   Cirrhosis Cousin           maternal   Diabetes Cousin          maternal   Stroke Neg Hx     Colon cancer Neg Hx     Prostate cancer Neg Hx          Social History:  reports that he has never smoked. He has never used smokeless tobacco. He reports that he does not drink alcohol and does not use drugs. Allergies:  Allergies  No Known Allergies         Medications Prior to Admission  Medication Sig Dispense Refill   allopurinol (ZYLOPRIM) 100 MG tablet Take 1 tablet (100 mg total) by mouth 2 (two) times daily. 180 tablet 1   atorvastatin (LIPITOR) 10 MG tablet Take 1 tablet (10 mg total) by mouth at bedtime. Due for appt 05/2023 90 tablet 0   carvedilol (COREG) 12.5 MG tablet Take 1 tablet (12.5 mg total) by mouth 2 (two) times daily with a meal. Due for appt 05/2023 180 tablet 0   cloNIDine (CATAPRES) 0.2 MG tablet Take 1 tablet (0.2 mg total) by mouth 2 (two) times daily. 180 tablet 1   cyanocobalamin (VITAMIN B12) 1000 MCG tablet Take 1,000 mcg by mouth every other day.       famotidine (PEPCID) 20 MG tablet Take 20 mg by mouth 2 (two) times daily.       JANUVIA 100 MG tablet Take 1 tablet (100 mg total) by mouth daily. 90 tablet 0   lisinopril (ZESTRIL) 20 MG tablet Take 1 tablet (20 mg total) by mouth 2 (two) times daily. 180 tablet 0   Magnesium Oxide 400 MG CAPS Take 2 capsules (800 mg total) by mouth 2 (two) times daily. 60 capsule 0   metFORMIN (GLUCOPHAGE) 1000 MG tablet Take 1 tablet (1,000 mg total) by mouth 2 (two) times daily with a meal. 180 tablet 0   pioglitazone (ACTOS) 30 MG tablet Take 1 tablet (30 mg total) by mouth daily. 90 tablet 1   Blood Glucose Monitoring Suppl (ONETOUCH VERIO FLEX SYSTEM) w/Device KIT Check blood sugar once daily 1 kit 0   glucose blood test strip Check blood sugars once daily 100 each 12   ONE TOUCH LANCETS MISC Check blood sugar once daily 100 each 12              Home: Home Living Family/patient expects to be discharged to:: Private residence Living  Arrangements: Spouse/significant other Available Help at Discharge: Family, Available 24 hours/day Type of Home: House Home Access: Stairs to enter Home Layout: One level Bathroom Shower/Tub: Engineer, manufacturing systems: Standard Bathroom Accessibility: Yes Home Equipment: Agricultural consultant (2 wheels)  Lives With: Spouse   Functional History: Prior Function Prior Level of Function : Independent/Modified Independent, Driving ADLs Comments: Independent with ADL's until recently was mowing, weedeating, driving   Functional Status:  Mobility: Bed Mobility Overal bed mobility: Needs Assistance  Bed Mobility: Supine to Sit Supine to sit: Supervision Sit to supine: Supervision General bed mobility comments: pt OOB in recliner at start and returned to recliner at EOS. Transfers Overall transfer level: Needs assistance Equipment used: Rolling walker (2 wheels) Transfers: Sit to/from Stand Sit to Stand: Min assist, Contact guard assist General transfer comment: close CGA for power up with min cues for technique/hand placement on armrest to initiate. pt steady with hand transition to RW and no posterior lean noted. no cues needed for reach back to control lower to chair and pt backing up to chair fully prior to sitting. Ambulation/Gait Ambulation/Gait assistance: Min assist, Contact guard assist Gait Distance (Feet): 125 Feet Assistive device: Rolling walker (2 wheels) Gait Pattern/deviations: Step-through pattern, Narrow base of support, Decreased dorsiflexion - right, Decreased dorsiflexion - left, Drifts right/left General Gait Details: Min assist at start with cues to maintain safe proximity to RW. pt with decreased Lt foot Df noted and decreased step length on Lt as pt fatigued. no significat list to Lt today and no overt LOB observed today. pt able to scan hallway room numbers to find his with min cues to guide turns in hallway. Gait velocity: decr Gait velocity interpretation: <1.8  ft/sec, indicate of risk for recurrent falls Stairs:  (pt defers)   ADL: ADL Overall ADL's : Needs assistance/impaired Eating/Feeding: Supervision/ safety, Sitting Eating/Feeding Details (indicate cue type and reason): for balnace in unsupported sititng Grooming: Contact guard assist, Standing, Oral care, Wash/dry face, Wash/dry hands Grooming Details (indicate cue type and reason): able to find objects in bag and use appropriately, extra time and decreased balance while complexity of task increased. cues for safety and awareness Upper Body Bathing: Set up, Sitting Lower Body Bathing: Contact guard assist, Sit to/from stand Lower Body Bathing Details (indicate cue type and reason): for peri area front and back Upper Body Dressing : Set up, Sitting Upper Body Dressing Details (indicate cue type and reason): new gown Lower Body Dressing: Minimal assistance, Sit to/from stand Toilet Transfer: Minimal assistance, Rolling walker (2 wheels), Ambulation Toilet Transfer Details (indicate cue type and reason): cues for safety with RW, min A for boost into standing Toileting- Clothing Manipulation and Hygiene: Minimal assistance, Sit to/from stand Toileting - Clothing Manipulation Details (indicate cue type and reason): front and back peri care Functional mobility during ADLs: Minimal assistance, Rolling walker (2 wheels) General ADL Comments: session focused on cognitive goals; pt declined need for ADLs   Cognition: Cognition Overall Cognitive Status: Impaired/Different from baseline Orientation Level: Oriented X4 Cognition Arousal: Alert Behavior During Therapy: WFL for tasks assessed/performed Overall Cognitive Status: Impaired/Different from baseline Area of Impairment: Problem solving, Attention, Safety/judgement Current Attention Level: Selective Memory: Decreased short-term memory Following Commands: Follows one step commands consistently Safety/Judgement: Decreased awareness of  safety Awareness: Emergent Problem Solving: Slow processing, Requires verbal cues General Comments: Pt very cooperative but requires increased cues for sequencing and safety. as complexity of task increases, balance decreases as well as safety awareness and processing.   Physical Exam: Blood pressure 98/64, pulse 69, temperature 98 F (36.7 C), temperature source Oral, resp. rate 19, height 6' (1.829 m), weight 85.6 kg, SpO2 95%. Physical Exam       General:  No apparent distress HEENT: Head is normocephalic, PERRLA, EOMI, sclera anicteric, oral mucosa pink and moist, dentition intact, ext ear canals clear, dressing in place over craniotomy site with a small amount of dried blood Neck: Supple without JVD or lymphadenopathy Heart: Reg rate and rhythm. No  murmurs rubs or gallops Chest: CTA bilaterally without wheezes, rales, or rhonchi; no distress Abdomen: Soft, non-tender, non-distended, bowel sounds positive. Extremities: No clubbing, cyanosis, or edema. Pulses are 2+ Psych: Pt's affect is appropriate. Pt is cooperative Skin: Clean and intact without signs of breakdown Bruising noted left upper extremity and right neck Neuro:  Alert and oriented x 4, follows commands, cranial nerves II through XII intact, no dysarthria or aphasia noted, recalls 3 out of 3 words 5 minutes later, able to name and repeat RUE: 5/5 Deltoid, 5/5 Biceps, 5/5 Triceps, 5/5 Wrist Ext, 5/5 Grip LUE: 5/5 Deltoid, 5/5 Biceps, 5/5 Triceps, 5/5 Wrist Ext, 5/5 Grip RLE: HF 5/5, KE 5/5, KF 5/5, ADF 5/5, APF 5/5 LLE: HF 5/5, KE 5/5, HF, 5/5, ADF 5/5, APF 5/5 Sensory exam normal for light touch and pain in all 4 limbs. No limb ataxia or cerebellar signs. No abnormal tone appreciated.    Musculoskeletal: No joint swelling or tenderness Right upper extremity IV     Lab Results Last 48 Hours        Results for orders placed or performed during the hospital encounter of 05/05/23 (from the past 48 hour(s))  Glucose,  capillary     Status: Abnormal    Collection Time: 05/13/23 12:17 PM  Result Value Ref Range    Glucose-Capillary 213 (H) 70 - 99 mg/dL      Comment: Glucose reference range applies only to samples taken after fasting for at least 8 hours.  Glucose, capillary     Status: Abnormal    Collection Time: 05/13/23  3:41 PM  Result Value Ref Range    Glucose-Capillary 121 (H) 70 - 99 mg/dL      Comment: Glucose reference range applies only to samples taken after fasting for at least 8 hours.  Glucose, capillary     Status: Abnormal    Collection Time: 05/13/23  8:06 PM  Result Value Ref Range    Glucose-Capillary 171 (H) 70 - 99 mg/dL      Comment: Glucose reference range applies only to samples taken after fasting for at least 8 hours.  Glucose, capillary     Status: Abnormal    Collection Time: 05/14/23 12:02 AM  Result Value Ref Range    Glucose-Capillary 64 (L) 70 - 99 mg/dL      Comment: Glucose reference range applies only to samples taken after fasting for at least 8 hours.  Glucose, capillary     Status: Abnormal    Collection Time: 05/14/23 12:28 AM  Result Value Ref Range    Glucose-Capillary 101 (H) 70 - 99 mg/dL      Comment: Glucose reference range applies only to samples taken after fasting for at least 8 hours.  Glucose, capillary     Status: Abnormal    Collection Time: 05/14/23  3:56 AM  Result Value Ref Range    Glucose-Capillary 100 (H) 70 - 99 mg/dL      Comment: Glucose reference range applies only to samples taken after fasting for at least 8 hours.  CBC     Status: Abnormal    Collection Time: 05/14/23  6:25 AM  Result Value Ref Range    WBC 24.8 (H) 4.0 - 10.5 K/uL    RBC 3.82 (L) 4.22 - 5.81 MIL/uL    Hemoglobin 11.8 (L) 13.0 - 17.0 g/dL    HCT 26.9 (L) 48.5 - 52.0 %    MCV 93.2 80.0 - 100.0 fL    MCH 30.9  26.0 - 34.0 pg    MCHC 33.1 30.0 - 36.0 g/dL    RDW 64.3 32.9 - 51.8 %    Platelets 153 150 - 400 K/uL    nRBC 0.0 0.0 - 0.2 %      Comment: Performed  at [facility], [address], [place], [place] [address]  Basic metabolic panel     Status: Abnormal    Collection Time: 05/14/23  6:25 AM  Result Value Ref Range    Sodium 133 (L) 135 - 145 mmol/L    Potassium 4.7 3.5 - 5.1 mmol/L    Chloride 102 98 - 111 mmol/L    CO2 24 22 - 32 mmol/L    Glucose, Bld 110 (H) 70 - 99 mg/dL      Comment: Glucose reference range applies only to samples taken after fasting for at least 8 hours.    BUN 30 (H) 8 - 23 mg/dL    Creatinine, Ser 0.63 0.61 - 1.24 mg/dL    Calcium 8.1 (L) 8.9 - 10.3 mg/dL    GFR, Estimated >01 >60 mL/min      Comment: (NOTE) Calculated using the CKD-EPI Creatinine Equation (2021)      Anion gap 7 5 - 15      Comment: Performed at [facility], [address], [place], [place] [address]  Magnesium     Status: None    Collection Time: 05/14/23  6:25 AM  Result Value Ref Range    Magnesium 1.9 1.7 - 2.4 mg/dL      Comment: Performed at [facility], [address], [place], [place] [address]  Glucose, capillary     Status: Abnormal    Collection Time: 05/14/23  8:15 AM  Result Value Ref Range    Glucose-Capillary 125 (H) 70 - 99 mg/dL      Comment: Glucose reference range applies only to samples taken after fasting for at least 8 hours.  Glucose, capillary     Status: Abnormal    Collection Time: 05/14/23 12:10 PM  Result Value Ref Range    Glucose-Capillary 192 (H) 70 - 99 mg/dL      Comment: Glucose reference range applies only to samples taken after fasting for at least 8 hours.  Glucose, capillary     Status: Abnormal    Collection Time: 05/14/23  4:12 PM  Result Value Ref Range    Glucose-Capillary 251 (H) 70 - 99 mg/dL      Comment: Glucose reference range applies only to samples taken after fasting for at least 8 hours.  Glucose, capillary     Status: Abnormal    Collection Time: 05/14/23  7:53 PM  Result Value Ref Range    Glucose-Capillary 276 (H) 70 - 99 mg/dL      Comment: Glucose  reference range applies only to samples taken after fasting for at least 8 hours.  Glucose, capillary     Status: Abnormal    Collection Time: 05/14/23 11:23 PM  Result Value Ref Range    Glucose-Capillary 205 (H) 70 - 99 mg/dL      Comment: Glucose reference range applies only to samples taken after fasting for at least 8 hours.  Glucose, capillary     Status: Abnormal    Collection Time: 05/15/23  4:17 AM  Result Value Ref Range    Glucose-Capillary 173 (H) 70 - 99 mg/dL      Comment: Glucose reference range applies only to samples  taken after fasting for at least 8 hours.  Basic metabolic panel     Status: Abnormal    Collection Time: 05/15/23  5:36 AM  Result Value Ref Range    Sodium 130 (L) 135 - 145 mmol/L    Potassium 4.2 3.5 - 5.1 mmol/L    Chloride 98 98 - 111 mmol/L    CO2 21 (L) 22 - 32 mmol/L    Glucose, Bld 150 (H) 70 - 99 mg/dL      Comment: Glucose reference range applies only to samples taken after fasting for at least 8 hours.    BUN 23 8 - 23 mg/dL    Creatinine, Ser 9.52 0.61 - 1.24 mg/dL    Calcium 7.8 (L) 8.9 - 10.3 mg/dL    GFR, Estimated >84 >13 mL/min      Comment: (NOTE) Calculated using the CKD-EPI Creatinine Equation (2021)      Anion gap 11 5 - 15      Comment: Performed at [facility], [address] [place], [place] [address]  CBC     Status: Abnormal    Collection Time: 05/15/23  5:37 AM  Result Value Ref Range    WBC 25.4 (H) 4.0 - 10.5 K/uL    RBC 3.88 (L) 4.22 - 5.81 MIL/uL    Hemoglobin 11.5 (L) 13.0 - 17.0 g/dL    HCT 02.7 (L) 25.3 - 52.0 %    MCV 90.2 80.0 - 100.0 fL    MCH 29.6 26.0 - 34.0 pg    MCHC 32.9 30.0 - 36.0 g/dL    RDW 66.4 40.3 - 47.4 %    Platelets 161 150 - 400 K/uL    nRBC 0.0 0.0 - 0.2 %      Comment: Performed at [facility], [address], [place], [place] [address]  Glucose, capillary     Status: Abnormal    Collection Time: 05/15/23  7:51 AM  Result Value Ref Range    Glucose-Capillary 146 (H) 70 -  99 mg/dL      Comment: Glucose reference range applies only to samples taken after fasting for at least 8 hours.  Glucose, capillary     Status: Abnormal    Collection Time: 05/15/23 11:15 AM  Result Value Ref Range    Glucose-Capillary 308 (H) 70 - 99 mg/dL      Comment: Glucose reference range applies only to samples taken after fasting for at least 8 hours.      Imaging Results (Last 48 hours)  No results found.         Blood pressure 98/64, pulse 69, temperature 98 F (36.7 C), temperature source Oral, resp. rate 19, height 6' (1.829 m), weight 85.6 kg, SpO2 95%.   Medical Problem List and Plan: 1. 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 Jake Samples on 05/12/2023             -patient may shower, cover surgical incisions             -ELOS/Goals: 7-10 days, Mod I PT/OT/SLP             -Admit to CIR   2.  Antithrombotics: -DVT/anticoagulation:  Pharmaceutical: Heparin started 9/05             -antiplatelet therapy: none   3. Pain Management: Tylenol, Norco as needed   4. Mood/Behavior/Sleep: LCSW to evaluate and provide emotional  support             -continue melatonin 5 mg q HS             -antipsychotic agents: n/a             -Consider trazodone if insomnia does not improve with melatonin   5. Neuropsych/cognition: This patient is capable of making decisions on his own behalf.   6. Skin/Wound Care: Routine skin care checks             -monitor surgical incision   7. Fluids/Electrolytes/Nutrition: Routine Is and Os and follow-up chemistries             -adjust to carb modified diet             -monitor PO intake (no current documentation)   8: Hypertension: monitor TID and prn, has been controlled overall             -continue carvedilol 12.5 mg Bid             -continue clonidine 0.2 mg BID             -continue lisinopril 20 mg BID   9: Hyperlipidemia: continue Lipitor 10  mg   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 Jake Samples, follow-up with Dr. Barbaraann Cao   11: DM-2: CBGs QID; A1c = 6.9%, steroid induced hyperglycemia (home meds include Januvia 100 mg daily, metformin 1000 mg twice daily, Actos 30 mg daily>>not restarted)             -continue SSI             -continue Semglee 16 units daily   12: GI prophylaxis: continue Protonix BID while on steroids   13: Hyponatremia: Last sodium 139/6, follow-up BMP   14: Mild anemia: Hgb 11.5 9/6, follow-up CBC   14: Leukocytosis: WBC 25.4 on 9/6 , likely steroid induced; currently afebrile>> monitor             -follow-up CBC   15.  Acute metabolic encephalopathy: TSH/B12 stable.  Improving             Milinda Antis, PA-C 05/15/2023   I have personally performed a face to face diagnostic evaluation of this patient and formulated the key components of the plan.  Additionally, I have personally reviewed laboratory data, imaging studies, as well as relevant notes and concur with the physician assistant's documentation above.   The patient's status has not changed from the original H&P.  Any changes in documentation from the acute care chart have been noted above.   Fanny Dance, MD, [place]

## 2023-05-15 NOTE — H&P (Signed)
Physical Medicine and Rehabilitation Admission H&P   CC: Functional deficits secondary to glial neoplasm  HPI: Alex Church is a 77 year old male with was admitted to [facility] on 05/05/2023 by way of transfer from [facility] with new diagnosis of right frontal brain mass after presenting from home to [facility] ED complaining of left-sided weakness.  The patient presents with complaint of 4 days of left-sided weakness involving the left upper and lower extremity, starting on Saturday, 05/02/2023.  Denied any associated acute focal numbness or paresthesias nor any acute vertigo, dysphagia, dysarthria, facial droop, acute change in vision. The patient's family also conveyed that the patient had appeared progressively confused over the last few months, starting in May 2024.  Over that time, they had noticed the patient becoming more forgetful, getting lost while driving, although very familiar with these routes. MRI brain with and without contrast showed large mass located within the right frontal lobe measuring 6.8 x 4.9 cm associate with mass effect on the right lateral ventricle and 6 mm of leftward midline shift in the absence of any evidence of acute infarct in the absence of any evidence of intracranial hemorrhage. EDP at [facility] d/w on-call neurosurgeon, Dr. Jake Samples , who recommended TRH admission to [facility]. Dr. Jake Samples conveyed that neurosurgery would formally consult, and did not feel that emergent surgery is indicated.  Rather, neurosurgery anticipated surgical intervention on Thursday, 05/07/2023, and recommends interval Decadron 4 mg every 6 hours. The patient underwent right stereotactic frontal craniotomy for resection of tumor by Dr. Jake Samples. On exam 9/05, speech fluent, CNs grossly intact, 5/5 BUE/BLE. Remains on steroid taper and on Keppra. Tolerating diet. He ambulated ~125' with RW and min assist fading to close CGA for safety. No significant lean/list Lt with gait but overall continues to have  reduced Df on Lt LE and decreased step length on Lt. Patient reports he has not been sleeping well and feels tired during the day.  He has chronic issues with sleep.  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  Lives with his significant other in a two-story home.  3 steps to enter the home.  Bedroom is upstairs but could potentially make bedroom downstairs if required.  His significant other plans to go back to work but sister can assist during the day.   Review of Systems  Constitutional:  Negative for chills and fever.  HENT:  Negative for hearing loss.   Eyes:        Cataract  Respiratory:  Negative for shortness of breath.   Cardiovascular:  Negative for chest pain.  Gastrointestinal:  Negative for abdominal pain, constipation, diarrhea, nausea and vomiting.  Genitourinary: Negative.   Musculoskeletal:  Negative for joint pain.  Neurological:  Negative for sensory change, speech change and weakness.  Psychiatric/Behavioral:  The patient has insomnia.    Past Medical History:  Diagnosis Date   B12 deficiency    monthly shots   Diabetes mellitus    Glaucoma suspect    Hyperlipemia    Hypertension    Past Surgical History:  Procedure Laterality Date   APPLICATION OF CRANIAL NAVIGATION Right 05/12/2023   Procedure: APPLICATION OF CRANIAL NAVIGATION;  Surgeon: Dawley, Alan Mulder, DO;  Location: [facility] OR;  Service: Neurosurgery;  Laterality: Right;   CRANIOTOMY Right 05/12/2023   Procedure: RIGHT STERIOSTACTIC FRONTAL CRANIOTOMY FOR TUMOR RESECTION;  Surgeon: Bethann Goo, DO;  Location: [facility] OR;  Service: Neurosurgery;  Laterality: Right;   NO PAST SURGERIES     Family History  Problem Relation Age of Onset   Diabetes Maternal Uncle    Heart attack Brother 41   Cancer Mother        intra-abdominal   Cirrhosis Cousin        maternal   Diabetes Cousin        maternal   Stroke Neg  Hx    Colon cancer Neg Hx    Prostate cancer Neg Hx    Social History:  reports that he has never smoked. He has never used smokeless tobacco. He reports that he does not drink alcohol and does not use drugs. Allergies: No Known Allergies Medications Prior to Admission  Medication Sig Dispense Refill   allopurinol (ZYLOPRIM) 100 MG tablet Take 1 tablet (100 mg total) by mouth 2 (two) times daily. 180 tablet 1   atorvastatin (LIPITOR) 10 MG tablet Take 1 tablet (10 mg total) by mouth at bedtime. Due for appt 05/2023 90 tablet 0   carvedilol (COREG) 12.5 MG tablet Take 1 tablet (12.5 mg total) by mouth 2 (two) times daily with a meal. Due for appt 05/2023 180 tablet 0   cloNIDine (CATAPRES) 0.2 MG tablet Take 1 tablet (0.2 mg total) by mouth 2 (two) times daily. 180 tablet 1   cyanocobalamin (VITAMIN B12) 1000 MCG tablet Take 1,000 mcg by mouth every other day.     famotidine (PEPCID) 20 MG tablet Take 20 mg by mouth 2 (two) times daily.     JANUVIA 100 MG tablet Take 1 tablet (100 mg total) by mouth daily. 90 tablet 0   lisinopril (ZESTRIL) 20 MG tablet Take 1 tablet (20 mg total) by mouth 2 (two) times daily. 180 tablet 0   Magnesium Oxide 400 MG CAPS Take 2 capsules (800 mg total) by mouth 2 (two) times daily. 60 capsule 0   metFORMIN (GLUCOPHAGE) 1000 MG tablet Take 1 tablet (1,000 mg total) by mouth 2 (two) times daily with a meal. 180 tablet 0   pioglitazone (ACTOS) 30 MG tablet Take 1 tablet (30 mg total) by mouth daily. 90 tablet 1   Blood Glucose Monitoring Suppl (ONETOUCH VERIO FLEX SYSTEM) w/Device KIT Check blood sugar once daily 1 kit 0   glucose blood test strip Check blood sugars once daily 100 each 12   ONE TOUCH LANCETS MISC Check blood sugar once daily 100 each 12      Home: Home Living Family/patient expects to be discharged to:: Private residence Living Arrangements: Spouse/significant other Available Help at Discharge: Family, Available 24 hours/day Type of Home:  House Home Access: Stairs to enter Home Layout: One level Bathroom Shower/Tub: Engineer, manufacturing systems: Standard Bathroom Accessibility: Yes Home Equipment: Agricultural consultant (2 wheels)  Lives With: Spouse   Functional History: Prior Function Prior Level of Function : Independent/Modified Independent, Driving ADLs Comments: Independent with ADL's until recently was mowing, weedeating, driving  Functional Status:  Mobility: Bed Mobility Overal bed mobility: Needs Assistance Bed Mobility: Supine to Sit Supine to sit: Supervision Sit to supine: Supervision General bed mobility comments: pt OOB in recliner at start and returned to recliner at EOS. Transfers Overall transfer level: Needs assistance Equipment used: Rolling walker (2 wheels) Transfers: Sit to/from Stand Sit to Stand: Min assist, Contact guard assist General transfer comment: close CGA for power up with min cues for technique/hand placement on armrest to initiate. pt steady with hand transition to RW and no  posterior lean noted. no cues needed for reach back to control lower to chair and pt backing up to chair fully prior to sitting. Ambulation/Gait Ambulation/Gait assistance: Min assist, Contact guard assist Gait Distance (Feet): 125 Feet Assistive device: Rolling walker (2 wheels) Gait Pattern/deviations: Step-through pattern, Narrow base of support, Decreased dorsiflexion - right, Decreased dorsiflexion - left, Drifts right/left General Gait Details: Min assist at start with cues to maintain safe proximity to RW. pt with decreased Lt foot Df noted and decreased step length on Lt as pt fatigued. no significat list to Lt today and no overt LOB observed today. pt able to scan hallway room numbers to find his with min cues to guide turns in hallway. Gait velocity: decr Gait velocity interpretation: <1.8 ft/sec, indicate of risk for recurrent falls Stairs:  (pt defers)    ADL: ADL Overall ADL's : Needs  assistance/impaired Eating/Feeding: Supervision/ safety, Sitting Eating/Feeding Details (indicate cue type and reason): for balnace in unsupported sititng Grooming: Contact guard assist, Standing, Oral care, Wash/dry face, Wash/dry hands Grooming Details (indicate cue type and reason): able to find objects in bag and use appropriately, extra time and decreased balance while complexity of task increased. cues for safety and awareness Upper Body Bathing: Set up, Sitting Lower Body Bathing: Contact guard assist, Sit to/from stand Lower Body Bathing Details (indicate cue type and reason): for peri area front and back Upper Body Dressing : Set up, Sitting Upper Body Dressing Details (indicate cue type and reason): new gown Lower Body Dressing: Minimal assistance, Sit to/from stand Toilet Transfer: Minimal assistance, Rolling walker (2 wheels), Ambulation Toilet Transfer Details (indicate cue type and reason): cues for safety with RW, min A for boost into standing Toileting- Clothing Manipulation and Hygiene: Minimal assistance, Sit to/from stand Toileting - Clothing Manipulation Details (indicate cue type and reason): front and back peri care Functional mobility during ADLs: Minimal assistance, Rolling walker (2 wheels) General ADL Comments: session focused on cognitive goals; pt declined need for ADLs  Cognition: Cognition Overall Cognitive Status: Impaired/Different from baseline Orientation Level: Oriented X4 Cognition Arousal: Alert Behavior During Therapy: WFL for tasks assessed/performed Overall Cognitive Status: Impaired/Different from baseline Area of Impairment: Problem solving, Attention, Safety/judgement Current Attention Level: Selective Memory: Decreased short-term memory Following Commands: Follows one step commands consistently Safety/Judgement: Decreased awareness of safety Awareness: Emergent Problem Solving: Slow processing, Requires verbal cues General Comments: Pt very  cooperative but requires increased cues for sequencing and safety. as complexity of task increases, balance decreases as well as safety awareness and processing.  Physical Exam: Blood pressure 98/64, pulse 69, temperature 98 F (36.7 C), temperature source Oral, resp. rate 19, height 6' (1.829 m), weight 85.6 kg, SpO2 95%. Physical Exam    General:  No apparent distress HEENT: Head is normocephalic, PERRLA, EOMI, sclera anicteric, oral mucosa pink and moist, dentition intact, ext ear canals clear, dressing in place over craniotomy site with a small amount of dried blood Neck: Supple without JVD or lymphadenopathy Heart: Reg rate and rhythm. No murmurs rubs or gallops Chest: CTA bilaterally without wheezes, rales, or rhonchi; no distress Abdomen: Soft, non-tender, non-distended, bowel sounds positive. Extremities: No clubbing, cyanosis, or edema. Pulses are 2+ Psych: Pt's affect is appropriate. Pt is cooperative Skin: Clean and intact without signs of breakdown Bruising noted left upper extremity and right neck Neuro:  Alert and oriented x 4, follows commands, cranial nerves II through XII intact, no dysarthria or aphasia noted, recalls 3 out of 3 words 5 minutes later, able  to name and repeat RUE: 5/5 Deltoid, 5/5 Biceps, 5/5 Triceps, 5/5 Wrist Ext, 5/5 Grip LUE: 5/5 Deltoid, 5/5 Biceps, 5/5 Triceps, 5/5 Wrist Ext, 5/5 Grip RLE: HF 5/5, KE 5/5, KF 5/5, ADF 5/5, APF 5/5 LLE: HF 5/5, KE 5/5, HF, 5/5, ADF 5/5, APF 5/5 Sensory exam normal for light touch and pain in all 4 limbs. No limb ataxia or cerebellar signs. No abnormal tone appreciated.   Musculoskeletal: No joint swelling or tenderness Right upper extremity IV   Results for orders placed or performed during the hospital encounter of 05/05/23 (from the past 48 hour(s))  Glucose, capillary     Status: Abnormal   Collection Time: 05/13/23 12:17 PM  Result Value Ref Range   Glucose-Capillary 213 (H) 70 - 99 mg/dL    Comment:  Glucose reference range applies only to samples taken after fasting for at least 8 hours.  Glucose, capillary     Status: Abnormal   Collection Time: 05/13/23  3:41 PM  Result Value Ref Range   Glucose-Capillary 121 (H) 70 - 99 mg/dL    Comment: Glucose reference range applies only to samples taken after fasting for at least 8 hours.  Glucose, capillary     Status: Abnormal   Collection Time: 05/13/23  8:06 PM  Result Value Ref Range   Glucose-Capillary 171 (H) 70 - 99 mg/dL    Comment: Glucose reference range applies only to samples taken after fasting for at least 8 hours.  Glucose, capillary     Status: Abnormal   Collection Time: 05/14/23 12:02 AM  Result Value Ref Range   Glucose-Capillary 64 (L) 70 - 99 mg/dL    Comment: Glucose reference range applies only to samples taken after fasting for at least 8 hours.  Glucose, capillary     Status: Abnormal   Collection Time: 05/14/23 12:28 AM  Result Value Ref Range   Glucose-Capillary 101 (H) 70 - 99 mg/dL    Comment: Glucose reference range applies only to samples taken after fasting for at least 8 hours.  Glucose, capillary     Status: Abnormal   Collection Time: 05/14/23  3:56 AM  Result Value Ref Range   Glucose-Capillary 100 (H) 70 - 99 mg/dL    Comment: Glucose reference range applies only to samples taken after fasting for at least 8 hours.  CBC     Status: Abnormal   Collection Time: 05/14/23  6:25 AM  Result Value Ref Range   WBC 24.8 (H) 4.0 - 10.5 K/uL   RBC 3.82 (L) 4.22 - 5.81 MIL/uL   Hemoglobin 11.8 (L) 13.0 - 17.0 g/dL   HCT 45.4 (L) 09.8 - 11.9 %   MCV 93.2 80.0 - 100.0 fL   MCH 30.9 26.0 - 34.0 pg   MCHC 33.1 30.0 - 36.0 g/dL   RDW 14.7 82.9 - 56.2 %   Platelets 153 150 - 400 K/uL   nRBC 0.0 0.0 - 0.2 %    Comment: Performed at [facility], [address] [place], [place] [address]  Basic metabolic panel     Status: Abnormal   Collection Time: 05/14/23  6:25 AM  Result Value Ref Range   Sodium 133  (L) 135 - 145 mmol/L   Potassium 4.7 3.5 - 5.1 mmol/L   Chloride 102 98 - 111 mmol/L   CO2 24 22 - 32 mmol/L   Glucose, Bld 110 (H) 70 - 99 mg/dL    Comment: Glucose reference range applies only to  samples taken after fasting for at least 8 hours.   BUN 30 (H) 8 - 23 mg/dL   Creatinine, Ser 5.78 0.61 - 1.24 mg/dL   Calcium 8.1 (L) 8.9 - 10.3 mg/dL   GFR, Estimated >46 >96 mL/min    Comment: (NOTE) Calculated using the CKD-EPI Creatinine Equation (2021)    Anion gap 7 5 - 15    Comment: Performed at [facility], [address], [place], [place] [address]  Magnesium     Status: None   Collection Time: 05/14/23  6:25 AM  Result Value Ref Range   Magnesium 1.9 1.7 - 2.4 mg/dL    Comment: Performed at [facility], [address], [place], [place] [address]  Glucose, capillary     Status: Abnormal   Collection Time: 05/14/23  8:15 AM  Result Value Ref Range   Glucose-Capillary 125 (H) 70 - 99 mg/dL    Comment: Glucose reference range applies only to samples taken after fasting for at least 8 hours.  Glucose, capillary     Status: Abnormal   Collection Time: 05/14/23 12:10 PM  Result Value Ref Range   Glucose-Capillary 192 (H) 70 - 99 mg/dL    Comment: Glucose reference range applies only to samples taken after fasting for at least 8 hours.  Glucose, capillary     Status: Abnormal   Collection Time: 05/14/23  4:12 PM  Result Value Ref Range   Glucose-Capillary 251 (H) 70 - 99 mg/dL    Comment: Glucose reference range applies only to samples taken after fasting for at least 8 hours.  Glucose, capillary     Status: Abnormal   Collection Time: 05/14/23  7:53 PM  Result Value Ref Range   Glucose-Capillary 276 (H) 70 - 99 mg/dL    Comment: Glucose reference range applies only to samples taken after fasting for at least 8 hours.  Glucose, capillary     Status: Abnormal   Collection Time: 05/14/23 11:23 PM  Result Value Ref Range   Glucose-Capillary 205 (H) 70 - 99 mg/dL     Comment: Glucose reference range applies only to samples taken after fasting for at least 8 hours.  Glucose, capillary     Status: Abnormal   Collection Time: 05/15/23  4:17 AM  Result Value Ref Range   Glucose-Capillary 173 (H) 70 - 99 mg/dL    Comment: Glucose reference range applies only to samples taken after fasting for at least 8 hours.  Basic metabolic panel     Status: Abnormal   Collection Time: 05/15/23  5:36 AM  Result Value Ref Range   Sodium 130 (L) 135 - 145 mmol/L   Potassium 4.2 3.5 - 5.1 mmol/L   Chloride 98 98 - 111 mmol/L   CO2 21 (L) 22 - 32 mmol/L   Glucose, Bld 150 (H) 70 - 99 mg/dL    Comment: Glucose reference range applies only to samples taken after fasting for at least 8 hours.   BUN 23 8 - 23 mg/dL   Creatinine, Ser 4.01 0.61 - 1.24 mg/dL   Calcium 7.8 (L) 8.9 - 10.3 mg/dL   GFR, Estimated >02 >72 mL/min    Comment: (NOTE) Calculated using the CKD-EPI Creatinine Equation (2021)    Anion gap 11 5 - 15    Comment: Performed at [facility], [address] [place], [place] [address]  CBC     Status: Abnormal   Collection Time: 05/15/23  5:37 AM  Result Value Ref Range   WBC 25.4 (H) 4.0 - 10.5 K/uL   RBC 3.88 (L) 4.22 - 5.81 MIL/uL   Hemoglobin 11.5 (L) 13.0 - 17.0 g/dL   HCT 86.5 (L) 78.4 - 69.6 %   MCV 90.2 80.0 - 100.0 fL   MCH 29.6 26.0 - 34.0 pg   MCHC 32.9 30.0 - 36.0 g/dL   RDW 29.5 28.4 - 13.2 %   Platelets 161 150 - 400 K/uL   nRBC 0.0 0.0 - 0.2 %    Comment: Performed at [facility], [address] [place], [place] [address]  Glucose, capillary     Status: Abnormal   Collection Time: 05/15/23  7:51 AM  Result Value Ref Range   Glucose-Capillary 146 (H) 70 - 99 mg/dL    Comment: Glucose reference range applies only to samples taken after fasting for at least 8 hours.  Glucose, capillary     Status: Abnormal   Collection Time: 05/15/23 11:15 AM  Result Value Ref Range   Glucose-Capillary 308 (H) 70 - 99 mg/dL    Comment:  Glucose reference range applies only to samples taken after fasting for at least 8 hours.   No results found.    Blood pressure 98/64, pulse 69, temperature 98 F (36.7 C), temperature source Oral, resp. rate 19, height 6' (1.829 m), weight 85.6 kg, SpO2 95%.  Medical Problem List and Plan: 1. 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 Jake Samples on 05/12/2023  -patient may shower, cover surgical incisions  -ELOS/Goals: 7-10 days, Mod I PT/OT/SLP  -Admit to CIR  2.  Antithrombotics: -DVT/anticoagulation:  Pharmaceutical: Heparin started 9/05  -antiplatelet therapy: none  3. Pain Management: Tylenol, Norco as needed  4. Mood/Behavior/Sleep: LCSW to evaluate and provide emotional support  -continue melatonin 5 mg q HS  -antipsychotic agents: n/a  -Consider trazodone if insomnia does not improve with melatonin  5. Neuropsych/cognition: This patient is capable of making decisions on his own behalf.  6. Skin/Wound Care: Routine skin care checks  -monitor surgical incision   7. Fluids/Electrolytes/Nutrition: Routine Is and Os and follow-up chemistries  -adjust to carb modified diet  -monitor PO intake (no current documentation)  8: Hypertension: monitor TID and prn, has been controlled overall  -continue carvedilol 12.5 mg Bid  -continue clonidine 0.2 mg BID  -continue lisinopril 20 mg BID  9: Hyperlipidemia: continue Lipitor 10 mg  10: High grade glial neoplasm:  -continue Decaron taper to 2mg  BID, dose to be maintained throughout further oncologic treatment per NS  -continue Keppra 500 mg BID  -follow-up with Dr. Jake Samples, follow-up with Dr. Barbaraann Cao  11: DM-2: CBGs QID; A1c = 6.9%, steroid induced hyperglycemia (home meds include Januvia 100 mg daily, metformin 1000 mg twice daily, Actos 30 mg daily>>not restarted)  -continue SSI  -continue Semglee 16 units daily  12: GI prophylaxis:  continue Protonix BID while on steroids  13: Hyponatremia: Last sodium 139/6, follow-up BMP  14: Mild anemia: Hgb 11.5 9/6, follow-up CBC  14: Leukocytosis: WBC 25.4 on 9/6 , likely steroid induced; currently afebrile>> monitor  -follow-up CBC  15.  Acute metabolic encephalopathy: TSH/B12 stable.  Improving       Milinda Antis, PA-C 05/15/2023  I have personally performed a face to face diagnostic evaluation of this patient and formulated the key components of the plan.  Additionally, I have personally reviewed laboratory data, imaging studies, as well  as relevant notes and concur with the physician assistant's documentation above.  The patient's status has not changed from the original H&P.  Any changes in documentation from the acute care chart have been noted above.  Fanny Dance, MD, [place]

## 2023-05-15 NOTE — Progress Notes (Signed)
Inpatient Rehab Admissions Coordinator:  Received insurance authorization. There is a bed for pt in CIR today. Dr. Sunnie Nielsen aware and in agreement. Pt, pt's wife, TOC and NSG made aware.  Wolfgang Phoenix, MS, CCC-SLP Admissions Coordinator [phone]

## 2023-05-15 NOTE — Progress Notes (Signed)
Signed     PMR Admission Coordinator Pre-Admission Assessment   Patient: Alex Church is an 77 y.o., male MRN: [id number] DOB: [birth year] Height: 6' (1.829 m) Weight: 85.4 kg   Insurance Information HMO:     PPO:      PCP:      IPA:      80/20:      OTHER:  PRIMARY: Blue Medicare       Policy#: [id number]       Subscriber: Pt CM Name: TBD      Phone#: TBD     Fax#: [phone] Pre-Cert#: [id number] Received insurance authorization from [employer] on 05/15/23. Authorization # is: [id number]. Pt approved from 05/15/23-05/22/23. Updates due on 05/21/23.     Employer: Retired Public affairs consultant:  Phone #: [phone]     Name: checked Blue-E Portal Eff. Date: 09/08/22     Deduct: $0      Out of Pocket Max: $3500 ($0 met)      Life Max: N/A CIR: $335/day for days 1-5      SNF: $0 for days 1-20; $203 for days 21-100 Outpatient: med nec     Co-Pay: $10/visit Home Health: 100%      Co-Pay: none DME: 80%     Co-Pay: 20% Providers: in network  SECONDARY:       Policy#:       Phone#:    Artist:       Phone#:    The Data processing manager" for patients in Inpatient Rehabilitation Facilities with attached "Privacy Act Statement-Health Care Records" was provided and verbally reviewed with: Patient   Emergency Contact Information Contact Information       Name Relation Home Work Mobile    Alex Church,Angela Spouse [phone]         Other Contacts   None on File        Current Medical History  Patient Admitting Diagnosis: Brain Mass History of Present Illness: Alex Church is a 77 y.o. male with medical history significant for type 2 diabetes mellitus, essential hypertension, hyperlipidemia, who is admitted to [facility] on 05/05/2023 by way of transfer from [facility] with new diagnosis of right frontal brain mass after presenting from home to [facility] ED complaining of left-sided weakness.   The patient presented with complaint of 4 days  of left-sided weakness involving the left upper and lower extremity, starting on Saturday, 05/02/2023.  At [facility] ED, course was as follows: Vital signs in the ED were notable for the following: Afebrile; heart rate in the 60s to 80s; systolic blood pressures in the 140s to 160s; respiratory rate 16-20, oxygen saturation 95 to 100% on room air. Labs were notable for the following: CMP notable for the following: Sodium 136, creatinine 1.20, glucose 148.  CBC notable for white cell count 9100.  Urinalysis notable for white blood cells, as well as leukocyte esterase/nitrate negative.  COVID-19 PCR negative.  EKG in ED demonstrated the following: Sinus rhythm with heart rate 70, normal intervals, no evidence of T wave or ST changes, Cleen evidence of ST ovation.Imaging in the ED, per corresponding formal radiology read, was notable for the following: 1 view chest x-ray showed no evidence of acute cardiopulmonary process, including evidence of ventricular edema, effusion, or pneumothorax.  MRI brain with and without contrast showed large mass located within the right frontal lobe measuring 6.8 x 4.9 cm associate with mass effect on the right lateral ventricle and  6 mm of leftward midline shift in the absence of any evidence of acute infarct in the absence of any evidence of intracranial hemorrhage. While in the ED, the following were administered: Decadron 4 mg IV x 1 dose. Pt. Seen was transferred to [facility] and seen by Neurosurgery, who took pt. To OR for resection of tumor 05/13/23. Pt. Seen by PT/OT during his admission and they recommend CIR to assist return to PLOF.      Patient's medical record from [facility]  has been reviewed by the rehabilitation admission coordinator and physician.   Past Medical History      Past Medical History:  Diagnosis Date   B12 deficiency      monthly shots   Diabetes mellitus     Glaucoma suspect     Hyperlipemia      Hypertension        Has the patient had major surgery during 100 days prior to admission? Yes   Family History   family history includes Cancer in his mother; Cirrhosis in his cousin; Diabetes in his cousin and maternal uncle; Heart attack (age of onset: 47) in his brother.   Current Medications   Current Facility-Administered Medications:    0.9 %  sodium chloride infusion (Manually program via Guardrails IV Fluids), , Intravenous, Once, Holtzman, Ariel Leffew, CRNA   0.9 %  sodium chloride infusion, , Intravenous, Continuous, Regalado, Belkys A, MD, Stopped at 05/13/23 0831   acetaminophen (TYLENOL) tablet 650 mg, 650 mg, Oral, Q6H PRN **OR** acetaminophen (TYLENOL) suppository 650 mg, 650 mg, Rectal, Q6H PRN, Howerter, Justin B, DO   alum & mag hydroxide-simeth (MAALOX/MYLANTA) 200-200-20 MG/5ML suspension 15 mL, 15 mL, Oral, Q4H PRN, Ghimire, Werner Lean, MD, 15 mL at 05/07/23 1409   atorvastatin (LIPITOR) tablet 10 mg, 10 mg, Oral, QHS, Howerter, Justin B, DO, 10 mg at 05/12/23 2232   carvedilol (COREG) tablet 12.5 mg, 12.5 mg, Oral, BID WC, Howerter, Justin B, DO, 12.5 mg at 05/13/23 0827   Chlorhexidine Gluconate Cloth 2 % PADS 6 each, 6 each, Topical, Daily, Dawley, Troy C, DO, 6 each at 05/13/23 0945   cloNIDine (CATAPRES) tablet 0.2 mg, 0.2 mg, Oral, BID, Howerter, Justin B, DO, 0.2 mg at 05/12/23 2232   dexamethasone (DECADRON) injection 4 mg, 4 mg, Intravenous, Q6H, Howerter, Justin B, DO, 4 mg at 05/13/23 0501   docusate sodium (COLACE) capsule 100 mg, 100 mg, Oral, BID, Patrici Ranks [place], PA-C, 100 mg at 05/12/23 2232   hydrALAZINE (APRESOLINE) injection 10-40 mg, 10-40 mg, Intravenous, Q4H PRN, Lidia Collum, PA-C, 40 mg at 05/13/23 0157   HYDROcodone-acetaminophen (NORCO/VICODIN) 5-325 MG per tablet 1 tablet, 1 tablet, Oral, Q4H PRN, Patrici Ranks [place], PA-C   HYDROmorphone (DILAUDID) injection 0.5 mg, 0.5 mg, Intravenous, Q3H PRN, Patrici Ranks [place]lin, PA-C, 0.5 mg at  05/13/23 0042   insulin aspart (novoLOG) injection 0-20 Units, 0-20 Units, Subcutaneous, Q4H, Lidia Collum, PA-C, 4 Units at 05/13/23 0827   insulin glargine-yfgn (SEMGLEE) injection 16 Units, 16 Units, Subcutaneous, Daily, Ghimire, Shanker M, MD, 16 Units at 05/12/23 0919   labetalol (NORMODYNE) injection 10-40 mg, 10-40 mg, Intravenous, Q10 min PRN, Patrici Ranks [place], PA-C, 20 mg at 05/13/23 9518   levETIRAcetam (KEPPRA) IVPB 500 mg/100 mL premix, 500 mg, Intravenous, Q12H, Patrici Ranks [place], PA-C, Last Rate: 400 mL/hr at 05/13/23 0939, 500 mg at 05/13/23 0939   lisinopril (ZESTRIL) tablet 20 mg, 20 mg, Oral, BID, Howerter, Justin B,  DO, 20 mg at 05/12/23 2232   melatonin tablet 5 mg, 5 mg, Oral, QHS, Ghimire, Shanker M, MD, 5 mg at 05/12/23 2232   mupirocin ointment (BACTROBAN) 2 % 1 Application, 1 Application, Nasal, BID, Desai, Rahul P, PA-C, 1 Application at 05/12/23 2232   ondansetron (ZOFRAN) injection 4 mg, 4 mg, Intravenous, Q6H PRN, Howerter, Justin B, DO, 4 mg at 05/13/23 0013   Oral care mouth rinse, 15 mL, Mouth Rinse, PRN, Ghimire, Werner Lean, MD   pantoprazole (PROTONIX) injection 40 mg, 40 mg, Intravenous, Q12H, Regalado, Belkys A, MD   polyethylene glycol (MIRALAX / GLYCOLAX) packet 17 g, 17 g, Oral, Daily PRN, Patrici Ranks [place], PA-C   prochlorperazine (COMPAZINE) injection 10 mg, 10 mg, Intravenous, Q6H PRN, Paliwal, Aditya, MD, 10 mg at 05/13/23 0057   promethazine (PHENERGAN) tablet 12.5-25 mg, 12.5-25 mg, Oral, Q4H PRN, Patrici Ranks [place], PA-C, 25 mg at 05/13/23 0827   sodium phosphate (FLEET) enema 1 enema, 1 enema, Rectal, Once PRN, Clovis Riley, PA-C   Patients Current Diet:  Diet Order                  Diet Heart Room service appropriate? Yes; Fluid consistency: Thin  Diet effective now                         Precautions / Restrictions Precautions Precautions: Fall Restrictions Weight Bearing Restrictions: No    Has the  patient had 2 or more falls or a fall with injury in the past year? No   Prior Activity Level Community (5-7x/wk): pt. active in the community PTA   Prior Functional Level Self Care: Did the patient need help bathing, dressing, using the toilet or eating? Independent   Indoor Mobility: Did the patient need assistance with walking from room to room (with or without device)? Independent   Stairs: Did the patient need assistance with internal or external stairs (with or without device)? Independent   Functional Cognition: Did the patient need help planning regular tasks such as shopping or remembering to take medications? Independent   Patient Information Are you of Hispanic, Latino/a,or Spanish origin?: A. No, not of Hispanic, Latino/a, or Spanish origin What is your race?: A. White Do you need or want an interpreter to communicate with a doctor or health care staff?: 2. No   Patient's Response To:  Health Literacy and Transportation Is the patient able to respond to health literacy and transportation needs?: Yes Health Literacy - How often do you need to have someone help you when you read instructions, pamphlets, or other written material from your doctor or pharmacy?: Never In the past 12 months, has lack of transportation kept you from medical appointments or from getting medications?: No In the past 12 months, has lack of transportation kept you from meetings, work, or from getting things needed for daily living?: No   Home Assistive Devices / Equipment Home Assistive Devices/Equipment: Blood pressure cuff, CBG Meter, Eyeglasses, Hand-held shower hose, Reacher, Scales Home Equipment: Agricultural consultant (2 wheels)   Prior Device Use: Indicate devices/aids used by the patient prior to current illness, exacerbation or injury? None of the above   Current Functional Level Cognition   Overall Cognitive Status: Impaired/Different from baseline Current Attention Level:  Selective Orientation Level: Oriented X4 Following Commands: Follows one step commands consistently Safety/Judgement: Decreased awareness of safety General Comments: delayed recall is [facility], pt needed minimal cues for 3 step pathfinding task. Noted increased  in LOBs during dual tasking with walking and cognitive tasks    Extremity Assessment (includes Sensation/Coordination)   Upper Extremity Assessment: Right hand dominant LUE Deficits / Details: decr strength as compared to R. Decreased coordination with finger to nose especially once near therapist finger.  Lower Extremity Assessment: Defer to PT evaluation     ADLs   Overall ADL's : Needs assistance/impaired Eating/Feeding: Supervision/ safety, Sitting Eating/Feeding Details (indicate cue type and reason): for balnace in unsupported sititng Grooming: Contact guard assist, Standing, Oral care Upper Body Bathing: Set up, Sitting Lower Body Bathing: Sit to/from stand, Minimal assistance Upper Body Dressing : Set up, Sitting Lower Body Dressing: Minimal assistance, Sit to/from stand Toilet Transfer: Minimal assistance, Rolling walker (2 wheels), Ambulation Functional mobility during ADLs: Minimal assistance, Rolling walker (2 wheels) General ADL Comments: session focused on cognitive goals; pt declined need for ADLs     Mobility   Overal bed mobility: Needs Assistance Bed Mobility: Supine to Sit, Sit to Supine Supine to sit: Supervision Sit to supine: Supervision General bed mobility comments: pt received up in hallway with niece.     Transfers   Overall transfer level: Needs assistance Equipment used: Rolling walker (2 wheels) Transfers: Sit to/from Stand Sit to Stand: Supervision General transfer comment: minA for LOBs during mobility throughout     Ambulation / Gait / Stairs / Wheelchair Mobility   Ambulation/Gait Ambulation/Gait assistance: Contact guard assist Gait Distance (Feet): 300 Feet (+~243ft with niece prior to  PTA arrival) Assistive device: Rolling walker (2 wheels), None Gait Pattern/deviations: Step-through pattern, Narrow base of support General Gait Details: Mild unsteadiness without AD but no buckling or overt LOB, pt with narrow BOS and some steps near to crossing midline, but no scissoring. Min cues for wider BOS, environmental navigation. Pt performs head turns, 180* turns, step over box and speed changes without significant difficulty. Pt defers stair trial due to fatigue so did not perform full DGI due to this. Gait velocity interpretation: <1.8 ft/sec, indicate of risk for recurrent falls Stairs:  (pt defers)     Posture / Balance Dynamic Sitting Balance Sitting balance - Comments: no lean sitting up in chair, limited assessment as pt already standing when PTA arrived Balance Overall balance assessment: Needs assistance Sitting-balance support: Single extremity supported, Bilateral upper extremity supported, Feet supported Sitting balance-Leahy Scale: Fair Sitting balance - Comments: no lean sitting up in chair, limited assessment as pt already standing when PTA arrived Standing balance support: During functional activity, No upper extremity supported Standing balance-Leahy Scale: Fair Standing balance comment: no significant LOB but pt appears more confident using RW, rec continuing RW when walking room/hallway with family assist. Standardized Balance Assessment Standardized Balance Assessment : Dynamic Gait Index Dynamic Gait Index Level Surface: Mild Impairment Change in Gait Speed: Mild Impairment Gait with Horizontal Head Turns: Normal Gait with Vertical Head Turns: Normal Gait and Pivot Turn: Normal Step Over Obstacle: Mild Impairment     Special needs/care consideration Skin Abrasion: arm/left; Ecchymosis: arm/bilateral; Petechiae: arm,chest/bilateral; Surgical incision: head/right and Diabetic Management: Semglee 16 units daily; Novolog 0-9  units every 4 hours    Previous  Home Environment (from acute therapy documentation) Living Arrangements: Spouse/significant other  Lives With: Spouse Available Help at Discharge: Family, Available 24 hours/day Type of Home: House Home Layout: One level Home Access: Stairs to enter [employer] Shower/Tub: Engineer, manufacturing systems: Pharmacist, community: Yes Home Care Services: No   Discharge Living Setting Plans for Discharge Living Setting: House Type of  Home at Discharge: House Discharge Home Layout: One level Discharge Home Access: Stairs to enter Entrance Stairs-Rails: Left Entrance Stairs-Number of Steps: 3 Discharge Bathroom Shower/Tub: Walk-in shower Discharge Bathroom Toilet: Standard Discharge Bathroom Accessibility: Yes How Accessible: Accessible via walker Does the patient have any problems obtaining your medications?: No   Social/Family/Support Systems Patient Roles: Spouse Contact Information: [phone] Anticipated Caregiver: Jatavius Manchego Anticipated Caregiver's Contact Information: 24/7 min A Caregiver Availability: 24/7 Discharge Plan Discussed with Primary Caregiver: Yes Is Caregiver In Agreement with Plan?: No Does Caregiver/Family have Issues with Lodging/Transportation while Pt is in Rehab?: No   Goals Patient/Family Goal for Rehab: PT/OT Mod I Expected length of stay: 7-10 days Pt/Family Agrees to Admission and willing to participate: Yes Program Orientation Provided & Reviewed with Pt/Caregiver Including Roles  & Responsibilities: Yes   Decrease burden of Care through IP rehab admission: not anticipated   Possible need for SNF placement upon discharge: not anticipated   Patient Condition: I have reviewed medical records from [facility], spoken with CM, and patient and spouse. I met with patient at the bedside for inpatient rehabilitation assessment.  Patient will benefit from ongoing PT and OT, can actively participate in 3 hours of therapy a day 5  days of the week, and can make measurable gains during the admission.  Patient will also benefit from the coordinated team approach during an Inpatient Acute Rehabilitation admission.  The patient will receive intensive therapy as well as Rehabilitation physician, nursing, social worker, and care management interventions.  Due to safety, skin/wound care, disease management, medication administration, pain management, and patient education the patient requires 24 hour a day rehabilitation nursing.  The patient is currently Contact G-Min A with mobility and basic ADLs.  Discharge setting and therapy post discharge at home with home health is anticipated.  Patient has agreed to participate in the Acute Inpatient Rehabilitation Program and will admit today.   Preadmission Screen Completed By:  Jeronimo Greaves, 05/13/2023 10:13 AM ______________________________________________________________________   Discussed status with Dr. Natale Lay on 05/15/23  at 10:50 AM and received approval for admission today.   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 Yes Co-Morbidities requiring supervision/potential complications: Metabolic encephalopathy, DM 2, HTN, HLD, Metabolic acidosis, Hypomagnesemia, nausea and vomiting Due to bladder management, bowel management, safety, skin/wound care, disease management, medication administration, pain management, and patient education, does the patient require 24 hr/day rehab nursing? Yes Does the patient require coordinated care of a physician, rehab nurse, PT, OT, and SLP to address physical and functional deficits in the context of the above medical diagnosis(es)?  Yes Addressing deficits in the following areas: balance, endurance, locomotion, strength, transferring, bowel/bladder control, bathing, dressing, feeding, grooming, toileting, cognition, speech, language, and psychosocial support Can the patient actively participate in an intensive therapy program of at least 3 hrs of therapy 5 days a week? Yes The potential for patient to make measurable gains while on inpatient rehab is excellent Anticipated functional outcomes upon discharge from inpatient rehab: modified independent PT, modified independent OT, modified independent SLP Estimated rehab length of stay to reach the above functional goals is: 7-10 days Anticipated discharge destination: Home 10. Overall Rehab/Functional Prognosis: excellent  MD Signature: Fanny Dance

## 2023-05-15 NOTE — Progress Notes (Signed)
    Providing Compassionate, Quality Care - Together   NEUROSURGERY PROGRESS NOTE     S: NAEs o/n.    O: EXAM:  BP 98/64 (BP Location: Right Arm)   Pulse 69   Temp 98 F (36.7 C) (Oral)   Resp 19   Ht 6' (1.829 m)   Wt 85.6 kg   SpO2 95%   BMI 25.59 kg/m     Awake, alert PERRL Speech fluent, appropriate  CNs grossly intact  5/5 BUE/BLE Dressing dried w/ blood, wound not actively draining   ASSESSMENT:  77 y.o. s/p resection R frontal tumor via craniotomy, POD#3     PLAN: -Appropriate for CIR -Continue slow Decadron wean to 2mg  BID, dose to be maintained throughout further oncologic tx.  -Call w/ questions/concerns.   Patrici Ranks, [facility]

## 2023-05-15 NOTE — Progress Notes (Signed)
Pt report called to 4 Cabin crew. Telemetry discontinued and telemetry notified. Pt alert and cooperative and ambulated to recliner and was rolled to room #2 by NT. All patient belongings to include cell phone and charging cable sent with patient or sent home with patients wife.

## 2023-05-15 NOTE — Discharge Summary (Signed)
Physician Discharge Summary   Patient: Alex Church MRN: [id number] DOB: [birth year]  Admit date:     05/05/2023  Discharge date: 05/15/23  Discharge Physician: Alba Cory   PCP: Wanda Plump, MD   Recommendations at discharge:   Needs to follow up with Dr Barbaraann Cao for further care of Glial tumor.  Needs to follow up with neurosurgery for post op care.  Please monitor CBC and Sodium level.    Discharge Diagnoses: Principal Problem:   Acute encephalopathy Active Problems:   HLD (hyperlipidemia)   Hypertension   DM2 (diabetes mellitus, type 2) (HCC)   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)   Left-sided weakness  Resolved Problems:   * No resolved hospital problems. 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  Neurosurgery and neuro-oncology consulted.  Patient underwent craniotomy excision of the brain mass on 9/3 by Dr. Jake Samples.     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 Jake Samples  9/3 -Patient was started on Decadron, weaning decadron per neurosurgery. Will keep him on PPI for GI prophylaxis.  -Day 3 post sx, management per Neurosurgery.  -On  keppra  BID>   see recommendation by neurosurgery in regards Decadron.   Acute metabolic encephalopathy: Secondary to brain mass.  He is alert and oriented. Improved.  TSH/ B12 stable    Diabetes type 2 with uncontrolled hyperglycemia due to steroids On Semglee and SSI.    Hypertension: On carvedilol, clonidine, lisinopril.    Hyperlipidemia: On Lipitor.    Metabolic acidosis; continue with IV  fluids.  Leukocytosis; post sx, and in setting of steroids. Monitor.    Debility deconditioning: needs PT. Hopefully CIA>    Hypomagnesemia; replaced.    Nausea, vomiting. Suspect related to brain mass. Post sx.  CT abdomen pelvis negative for obstruction on admission.  IV protonix.  IV PRN phenergan.  Resolved.    Hyponatremia; Monitor., change diet to regular.  Leukocytosis in setting of Steroids. Monitor for fever.    Estimated body mass index is 25.59 kg/m as calculated from the following:   Height as of this encounter: 6' (1.829 m).   Weight as of this encounter: 85.6 kg.            Consultants: Dr Jake Samples Procedures performed: -Status post right stereo static frontal craniotomy for resection of tumor by Dr. Jake Samples  9/3 Disposition: Rehabilitation facility Diet recommendation:  Discharge Diet Orders (From admission, onward)     Start     Ordered   05/15/23 0000  Diet general        05/15/23 1054           Regular diet DISCHARGE MEDICATION: Allergies as of 05/15/2023   No Known Allergies      Medication List     STOP taking these medications    famotidine 20 MG tablet Commonly known as: PEPCID   Januvia 100 MG tablet Generic drug: sitaGLIPtin   metFORMIN 1000 MG tablet Commonly known as: GLUCOPHAGE   pioglitazone 30 MG tablet Commonly known as: ACTOS       TAKE these medications    allopurinol 100 MG tablet Commonly known as: ZYLOPRIM  Take 1 tablet (100 mg total) by mouth 2 (two) times daily.   atorvastatin 10 MG tablet Commonly known as: LIPITOR Take 1 tablet (10 mg total) by mouth at bedtime. Due for appt 05/2023   carvedilol 12.5 MG tablet Commonly known as: COREG Take 1 tablet (12.5 mg total) by mouth 2 (two) times daily with a meal. Due for appt 05/2023   cloNIDine 0.2 MG tablet Commonly known as: CATAPRES Take 1 tablet (0.2 mg total) by mouth 2 (two) times daily.   cyanocobalamin 1000 MCG tablet Commonly known as: VITAMIN  B12 Take 1,000 mcg by mouth every other day.   dexamethasone 4 MG/ML injection Commonly known as: DECADRON Inject 1 mL (4 mg total) into the vein every 8 (eight) hours for 4 days, THEN 0.5 mLs (2 mg total) every 8 (eight) hours for 4 days, THEN 0.5 mLs (2 mg total) every 12 (twelve) hours. Start taking on: May 15, 2023   glucose blood test strip Check blood sugars once daily   insulin glargine-yfgn 100 UNIT/ML injection Commonly known as: SEMGLEE Inject 0.16 mLs (16 Units total) into the skin daily. Start taking on: May 16, 2023   levETIRAcetam 500 MG tablet Commonly known as: KEPPRA Take 1 tablet (500 mg total) by mouth 2 (two) times daily.   lisinopril 20 MG tablet Commonly known as: ZESTRIL Take 1 tablet (20 mg total) by mouth 2 (two) times daily.   Magnesium Oxide 400 MG Caps Take 2 capsules (800 mg total) by mouth 2 (two) times daily.   melatonin 5 MG Tabs Take 1 tablet (5 mg total) by mouth at bedtime.   ONE TOUCH LANCETS Misc Check blood sugar once daily   OneTouch Verio Flex System w/Device Kit Check blood sugar once daily   pantoprazole 40 MG tablet Commonly known as: PROTONIX Take 1 tablet (40 mg total) by mouth 2 (two) times daily.   polyethylene glycol 17 g packet Commonly known as: MIRALAX / GLYCOLAX Take 17 g by mouth daily as needed for mild constipation.        Discharge Exam: Filed Weights   05/12/23 0500 05/13/23 0500 05/14/23 0424  Weight: 88.1 kg 85.4 kg 85.6 kg   General; NAD  Condition at discharge: stable  The results of significant diagnostics from this hospitalization (including imaging, microbiology, ancillary and laboratory) are listed below for reference.   Imaging Studies: MR BRAIN W WO CONTRAST  Result Date: 05/13/2023 CLINICAL DATA:  Craniotomy, post-op EXAM: MRI HEAD WITHOUT AND WITH CONTRAST TECHNIQUE: Multiplanar, multiecho pulse sequences of the brain and surrounding structures were obtained without and with  intravenous contrast. CONTRAST:  8mL GADAVIST GADOBUTROL 1 MMOL/ML IV SOLN COMPARISON:  MRI head 05/05/2023. FINDINGS: Brain: Interval debulking of a right frontal lesion appears larger section cavity with expected postoperative blood products in the resection cavity as well as overlying extra-axial spaces. Small to moderate volume of pneumocephalus antidependently along the frontal convexities anteriorly. Surrounding T2/FLAIR hyperintensities is mildly improved and mass effect is mildly improved. Leftward midline shift now measures approximately 4 mm anteriorly. No suspicious residual enhancement, although postoperative left limits assessment. Mm continued attention on follow-up. No evidence of acute infarct or hydrocephalus. Vascular: Major arterial flow voids are maintained at the skull base. Skull and upper cervical spine: Normal marrow signal. Sinuses/Orbits: Clear sinuses.  No acute orbital findings. Other: No mastoid effusions. IMPRESSION: Expected postoperative findings after debulking of a large right frontal mass, detailed above. Electronically Signed   By: Juluis Mire.D.  On: 05/13/2023 13:12   CT DBS HEAD W/O CONTRAST ( )  Result Date: 05/12/2023 CLINICAL DATA:  [id number] Malignant frontal lobe tumor (HCC) [id number] EXAM: CT HEAD WITHOUT CONTRAST TECHNIQUE: Contiguous axial images were obtained from the base of the skull through the vertex without intravenous contrast. RADIATION DOSE REDUCTION: This exam was performed according to the departmental dose-optimization program which includes automated exposure control, adjustment of the mA and/or kV according to patient size and/or use of iterative reconstruction technique. COMPARISON:  MRI and CT head 05/05/2023. FINDINGS: Brain: Redemonstrated heterogeneous right frontal mass with suspected mild improvement in surrounding edema and mass effect. Approximately 5 mm of leftward midline shift. No evidence of mass occupying acute hemorrhage, acute large  vascular territory infarct or hydrocephalus. Vascular: No hyperdense vessel. Skull: No acute fracture. Sinuses/Orbits: Left the clear sinuses.  No acute orbital findings. Other: No mastoid effusions. IMPRESSION: Redemonstrated heterogeneous right frontal mass with suspected mild improvement in surrounding edema and mass effect. Approximately 5 mm of leftward midline shift. Electronically Signed   By: Feliberto Harts M.D.   On: 05/12/2023 14:33   CT CHEST ABDOMEN PELVIS W CONTRAST  Result Date: 05/06/2023 CLINICAL DATA:  Brain malignancy, primary search * Tracking Code: BO * EXAM: CT CHEST, ABDOMEN, AND PELVIS WITH CONTRAST TECHNIQUE: Multidetector CT imaging of the chest, abdomen and pelvis was performed following the standard protocol during bolus administration of intravenous contrast. RADIATION DOSE REDUCTION: This exam was performed according to the departmental dose-optimization program which includes automated exposure control, adjustment of the mA and/or kV according to patient size and/or use of iterative reconstruction technique. CONTRAST:  75mL OMNIPAQUE IOHEXOL 350 MG/ML SOLN COMPARISON:  None Available. FINDINGS: CT CHEST FINDINGS Cardiovascular: Aortic atherosclerosis. Normal heart size. Extensive three-vessel coronary artery calcifications and stents. No pericardial effusion. Mediastinum/Nodes: No enlarged mediastinal, hilar, or axillary lymph nodes. Thyroid gland, trachea, and esophagus demonstrate no significant findings. Lungs/Pleura: Bandlike scarring of the bilateral lung bases. No pleural effusion or pneumothorax. Musculoskeletal: No chest wall abnormality. No acute osseous findings. CT ABDOMEN PELVIS FINDINGS Hepatobiliary: No solid liver abnormality is seen. Calcified sludge and gallstones in the gallbladder. No gallbladder wall thickening, or biliary dilatation. Pancreas: Unremarkable. No pancreatic ductal dilatation or surrounding inflammatory changes. Spleen: Normal in size without  significant abnormality. Benign cyst of the superior spleen, requiring no further follow-up or characterization. Adrenals/Urinary Tract: Adrenal glands are unremarkable. Nonobstructive calculi of the inferior pole of the left kidney. No right-sided calculi, ureteral calculi bladder is unremarkable. Stomach/Bowel: Stomach is within normal limits. Appendix appears normal. No evidence of bowel wall thickening, distention, or inflammatory changes. Descending and sigmoid diverticulosis. Vascular/Lymphatic: Aortic atherosclerosis. No enlarged abdominal or pelvic lymph nodes. Reproductive: Prostatomegaly Other: No abdominal wall hernia or abnormality. No ascites. Musculoskeletal: No acute osseous findings. IMPRESSION: 1. No evidence of primary neoplasm or disease in the chest, abdomen, or pelvis. 2. Cholelithiasis 3. Nonobstructive left nephrolithiasis. 4. Descending and sigmoid diverticulosis without evidence of acute diverticulitis. 5. Prostatomegaly. 6. Coronary artery disease. Aortic Atherosclerosis (ICD10-I70.0). Electronically Signed   By: Jearld Lesch M.D.   On: 05/06/2023 16:37   MR Brain W and Wo Contrast  Result Date: 05/05/2023 CLINICAL DATA:  CNS neoplasm monitoring EXAM: MRI HEAD WITHOUT AND WITH CONTRAST TECHNIQUE: Multiplanar, multiecho pulse sequences of the brain and surrounding structures were obtained without and with intravenous contrast. CONTRAST:  9mL GADAVIST GADOBUTROL 1 MMOL/ML IV SOLN COMPARISON:  05/05/2023 CTA head neck FINDINGS: Brain: There is a large, predominantly peripherally contrast-enhancing mass located within the  right frontal lobe, measuring 6.8 x 4.9 cm. There is a large amount of surrounding hyperintense T2-weighted signal. There is marked mass effect on the right lateral ventricle and leftward midline shift 6 mm. There is early subfalcine herniation of the cingulate gyrus. No acute hemorrhage. The central necrotic component of the mass does not restrict diffusion. Vascular:  Major flow voids are preserved. Skull and upper cervical spine: Normal calvarium and skull base. Visualized upper cervical spine and soft tissues are normal. Sinuses/Orbits:No paranasal sinus fluid levels or advanced mucosal thickening. No mastoid or middle ear effusion. Normal orbits. IMPRESSION: 1. Large, predominantly peripherally contrast-enhancing mass located within the right frontal lobe, measuring 6.8 x 4.9 cm. Large amount of surrounding hyperintense T2-weighted signal. This is most consistent with a high-grade glioma. 2. Marked mass effect on the right lateral ventricle and 6 mm leftward midline shift. Early subfalcine herniation of the cingulate gyrus. Electronically Signed   By: Deatra Robinson M.D.   On: 05/05/2023 22:39   CT ANGIO HEAD NECK W WO CM  Result Date: 05/05/2023 CLINICAL DATA:  Fall.  Left-sided weakness. EXAM: CT CERVICAL SPINE WITHOUT CONTRAST CT ANGIOGRAPHY HEAD AND NECK WITH AND WITHOUT CONTRAST TECHNIQUE: Multidetector CT imaging of the cervical spine was performed without intravenous contrast. Multiplanar CT image reconstructions were also generated. Multidetector CT imaging of the head and neck was performed using the standard protocol during bolus administration of intravenous contrast. Multiplanar CT image reconstructions and MIPs were obtained to evaluate the vascular anatomy. Carotid stenosis measurements (when applicable) are obtained utilizing NASCET criteria, using the distal internal carotid diameter as the denominator. RADIATION DOSE REDUCTION: This exam was performed according to the departmental dose-optimization program which includes automated exposure control, adjustment of the mA and/or kV according to patient size and/or use of iterative reconstruction technique. CONTRAST:  75mL OMNIPAQUE IOHEXOL 350 MG/ML SOLN COMPARISON:  None Available. FINDINGS: CT HEAD FINDINGS Brain: There is a large 5.2 x 4.2 x 5.2 cm peripherally contrast-enhancing mass centered in the right  frontal lobe. This mass is likely centrally necrotic. There is vasogenic edema surrounding this lesion with proximally 6 mm leftward midline shift. No hydrocephalus. No extra-axial fluid collection. No CT evidence of an acute cortical infarct. Vascular: No hyperdense vessel or unexpected calcification. Skull: Normal. Negative for fracture or focal lesion. Sinuses/Orbits: No middle ear or mastoid effusion. Paranasal sinuses are clear. Orbits are unremarkable. Other: None. Review of the MIP images confirms the above findings CT CERVICAL SPINE FINDINGS: Alignment: Grade 1 anterolisthesis of C3 on C4 and C4 on C5. Skull base and vertebrae: No acute fracture. No primary bone lesion or focal pathologic process. Assessment of the lower cervical spine is limited due to a combination of streak artifact and osteopenia. Soft tissues and spinal canal: No prevertebral fluid or swelling. No visible canal hematoma. Disc levels:  No evidence of high-grade spinal canal stenosis. Upper chest: Negative. Other: None CTA NECK FINDINGS Aortic arch: Standard branching. Imaged portion shows no evidence of aneurysm or dissection. No significant stenosis of the major arch vessel origins. Right carotid system: No evidence of dissection, stenosis (50% or greater), or occlusion. Left carotid system: No evidence of dissection, stenosis (50% or greater), or occlusion. Vertebral arteries: Moderate to severe narrowing of the origin of the right vertebral artery. Moderate to severe narrowing in the V4 segment of the right vertebral artery. Mild narrowing of the origin of the left vertebral artery. No evidence of a dissection. Codominant. Skeleton: See above Other neck: Negative. Upper chest:  Negative. Review of the MIP images confirms the above findings CTA HEAD FINDINGS Anterior circulation: No significant stenosis, proximal occlusion, aneurysm, or vascular malformation. Posterior circulation: Mild narrowing of the P1 P2 junction of the left PCA.  Otherwise no proximal occlusion, aneurysm, or vascular malformation. Venous sinuses: As permitted by contrast timing, patent. Anatomic variants: None Review of the MIP images confirms the above findings IMPRESSION: 1. Large 5.2 x 4.2 x 5.2 cm peripherally contrast-enhancing mass centered in the right frontal lobe with surrounding vasogenic edema and approximately 6 mm leftward midline shift. This could either represent a solitary metastatic lesion or a high grade glial neoplasm. Recommend brain MRI with and without contrast for further evaluation. 2. No acute fracture or traumatic listhesis of the cervical spine. 3. Moderate to severe narrowing of the origin of the right vertebral artery and V4 segment of the right vertebral artery. Electronically Signed   By: Lorenza Cambridge M.D.   On: 05/05/2023 16:26   CT C-SPINE NO CHARGE  Result Date: 05/05/2023 CLINICAL DATA:  Fall.  Left-sided weakness. EXAM: CT CERVICAL SPINE WITHOUT CONTRAST CT ANGIOGRAPHY HEAD AND NECK WITH AND WITHOUT CONTRAST TECHNIQUE: Multidetector CT imaging of the cervical spine was performed without intravenous contrast. Multiplanar CT image reconstructions were also generated. Multidetector CT imaging of the head and neck was performed using the standard protocol during bolus administration of intravenous contrast. Multiplanar CT image reconstructions and MIPs were obtained to evaluate the vascular anatomy. Carotid stenosis measurements (when applicable) are obtained utilizing NASCET criteria, using the distal internal carotid diameter as the denominator. RADIATION DOSE REDUCTION: This exam was performed according to the departmental dose-optimization program which includes automated exposure control, adjustment of the mA and/or kV according to patient size and/or use of iterative reconstruction technique. CONTRAST:  75mL OMNIPAQUE IOHEXOL 350 MG/ML SOLN COMPARISON:  None Available. FINDINGS: CT HEAD FINDINGS Brain: There is a large 5.2 x 4.2 x  5.2 cm peripherally contrast-enhancing mass centered in the right frontal lobe. This mass is likely centrally necrotic. There is vasogenic edema surrounding this lesion with proximally 6 mm leftward midline shift. No hydrocephalus. No extra-axial fluid collection. No CT evidence of an acute cortical infarct. Vascular: No hyperdense vessel or unexpected calcification. Skull: Normal. Negative for fracture or focal lesion. Sinuses/Orbits: No middle ear or mastoid effusion. Paranasal sinuses are clear. Orbits are unremarkable. Other: None. Review of the MIP images confirms the above findings CT CERVICAL SPINE FINDINGS: Alignment: Grade 1 anterolisthesis of C3 on C4 and C4 on C5. Skull base and vertebrae: No acute fracture. No primary bone lesion or focal pathologic process. Assessment of the lower cervical spine is limited due to a combination of streak artifact and osteopenia. Soft tissues and spinal canal: No prevertebral fluid or swelling. No visible canal hematoma. Disc levels:  No evidence of high-grade spinal canal stenosis. Upper chest: Negative. Other: None CTA NECK FINDINGS Aortic arch: Standard branching. Imaged portion shows no evidence of aneurysm or dissection. No significant stenosis of the major arch vessel origins. Right carotid system: No evidence of dissection, stenosis (50% or greater), or occlusion. Left carotid system: No evidence of dissection, stenosis (50% or greater), or occlusion. Vertebral arteries: Moderate to severe narrowing of the origin of the right vertebral artery. Moderate to severe narrowing in the V4 segment of the right vertebral artery. Mild narrowing of the origin of the left vertebral artery. No evidence of a dissection. Codominant. Skeleton: See above Other neck: Negative. Upper chest: Negative. Review of the MIP images confirms  the above findings CTA HEAD FINDINGS Anterior circulation: No significant stenosis, proximal occlusion, aneurysm, or vascular malformation. Posterior  circulation: Mild narrowing of the P1 P2 junction of the left PCA. Otherwise no proximal occlusion, aneurysm, or vascular malformation. Venous sinuses: As permitted by contrast timing, patent. Anatomic variants: None Review of the MIP images confirms the above findings IMPRESSION: 1. Large 5.2 x 4.2 x 5.2 cm peripherally contrast-enhancing mass centered in the right frontal lobe with surrounding vasogenic edema and approximately 6 mm leftward midline shift. This could either represent a solitary metastatic lesion or a high grade glial neoplasm. Recommend brain MRI with and without contrast for further evaluation. 2. No acute fracture or traumatic listhesis of the cervical spine. 3. Moderate to severe narrowing of the origin of the right vertebral artery and V4 segment of the right vertebral artery. Electronically Signed   By: Lorenza Cambridge M.D.   On: 05/05/2023 16:26   DG Chest Portable 1 View  Result Date: 05/05/2023 CLINICAL DATA:  Chest pain EXAM: PORTABLE CHEST 1 VIEW COMPARISON:  None Available. FINDINGS: Slight linear opacity left lung base likely scar or atelectasis. No consolidation, pneumothorax or effusion. No edema. Calcified aorta. Overlapping cardiac leads. IMPRESSION: Minimal left basilar atelectasis or scar. No acute cardiopulmonary disease Electronically Signed   By: Karen Kays M.D.   On: 05/05/2023 14:56    Microbiology: Results for orders placed or performed during the hospital encounter of 05/05/23  SARS Coronavirus 2 by RT PCR (hospital order, performed in [facility] hospital lab) *cepheid single result test* Anterior Nasal Swab     Status: None   Collection Time: 05/05/23  1:01 PM   Specimen: Anterior Nasal Swab  Result Value Ref Range Status   SARS Coronavirus 2 by RT PCR NEGATIVE NEGATIVE Final    Comment: (NOTE) SARS-CoV-2 target nucleic acids are NOT DETECTED.  The SARS-CoV-2 RNA is generally detectable in upper and lower respiratory specimens during the acute phase of  infection. The lowest concentration of SARS-CoV-2 viral copies this assay can detect is 250 copies / mL. A negative result does not preclude SARS-CoV-2 infection and should not be used as the sole basis for treatment or other patient management decisions.  A negative result may occur with improper specimen collection / handling, submission of specimen other than nasopharyngeal swab, presence of viral mutation(s) within the areas targeted by this assay, and inadequate number of viral copies (<250 copies / mL). A negative result must be combined with clinical observations, patient history, and epidemiological information.  Fact Sheet for Patients:   RoadLapTop.co.za  Fact Sheet for Healthcare Providers: http://kim-miller.com/  This test is not yet approved or  cleared by the [place] FDA and has been authorized for detection and/or diagnosis of SARS-CoV-2 by FDA under an Emergency Use Authorization (EUA).  This EUA will remain in effect (meaning this test can be used) for the duration of the COVID-19 declaration under Section 564(b)(1) of the Act, 21 U.S.C. section 360bbb-3(b)(1), unless the authorization is terminated or revoked sooner.  Performed at [facility], [address] [place], [place] [address]   MRSA Next Gen by PCR, Nasal     Status: Abnormal   Collection Time: 05/12/23  7:53 PM   Specimen: Nasal Mucosa; Nasal Swab  Result Value Ref Range Status   MRSA by PCR Next Gen DETECTED (A) NOT DETECTED Final    Comment: RESULT CALLED TO, READ BACK BY AND VERIFIED WITH: RN Elby Showers [phone] @ 2143 FH (  NOTE) The GeneXpert MRSA Assay (FDA approved for NASAL specimens only), is one component of a comprehensive MRSA colonization surveillance program. It is not intended to diagnose MRSA infection nor to guide or monitor treatment for MRSA infections. Test performance is not FDA approved in patients less than 76  years old. Performed at [facility], [address], [place], [place] [address]     Labs: CBC: Recent Labs  Lab 05/09/23 0408 05/11/23 0305 05/12/23 1436 05/13/23 0500 05/14/23 0625 05/15/23 0537  WBC 14.7* 14.7*  --  30.0* 24.8* 25.4*  HGB 12.5* 12.5* 12.9* 13.6 11.8* 11.5*  HCT 38.1* 38.3* 38.0* 40.4 35.6* 35.0*  MCV 92.3 91.0  --  89.6 93.2 90.2  PLT 198 197  --  253 153 161   Basic Metabolic Panel: Recent Labs  Lab 05/11/23 0305 05/12/23 1436 05/12/23 1844 05/13/23 0500 05/14/23 0625 05/15/23 0536  NA 131* 135 134* 136 133* 130*  K 4.2 4.3 4.3 4.1 4.7 4.2  CL 104  --  103 106 102 98  CO2 20*  --  20* 19* 24 21*  GLUCOSE 180*  --  190* 118* 110* 150*  BUN 39*  --  38* 37* 30* 23  CREATININE 1.23  --  1.01 0.97 0.98 0.87  CALCIUM 8.4*  --  8.0* 8.0* 8.1* 7.8*  MG  --   --   --  1.8 1.9  --    Liver Function Tests: Recent Labs  Lab 05/11/23 0305  AST 33  ALT 73*  ALKPHOS 51  BILITOT 0.7  PROT 5.5*  ALBUMIN 2.7*   CBG: Recent Labs  Lab 05/14/23 1612 05/14/23 1953 05/14/23 2323 05/15/23 0417 05/15/23 0751  GLUCAP 251* 276* 205* 173* 146*    Discharge time spent: greater than 30 minutes.  Signed: Alba Cory, MD [facility] 05/15/2023

## 2023-05-15 NOTE — TOC Transition Note (Signed)
Transition of Care (TOC) - CM/SW Discharge Note Donn Pierini RN,BSN Transitions of Care Unit 4NP (Non Trauma)- RN Case Manager See Treatment Team for direct Phone #   Patient Details  Name: Alex Church MRN: [id number] Date of Birth: 1946-07-19  Transition of Care [facility]) CM/SW Contact:  Darrold Span, RN Phone Number: 05/15/2023, 11:43 AM   Clinical Narrative:    CM has been notified by [facility] liaison that pt has bed available for admit to [facility] INPT rehab today. Berkley Harvey has been received from [facility] for admission.   Pt medically stable and cleared for transition to [facility] INPT rehab.   No further TOC needs noted.     Final next level of care: IP Rehab Facility Barriers to Discharge: Barriers Resolved   Patient Goals and CMS Choice CMS Medicare.gov Compare Post Acute Care list provided to:: Patient Choice offered to / list presented to : Patient, Spouse  Discharge Placement               [facility] INPT rehab          Discharge Plan and Services Additional resources added to the After Visit Summary for   In-house Referral: NA Discharge Planning Services: CM Consult Post Acute Care Choice: IP Rehab          DME Arranged: N/A DME Agency: NA       HH Arranged: NA HH Agency: NA        Social Determinants of Health (SDOH) Interventions SDOH Screenings   Food Insecurity: No Food Insecurity (05/06/2023)  Housing: Low Risk  (05/06/2023)  Transportation Needs: No Transportation Needs (05/06/2023)  Utilities: Not At Risk (05/06/2023)  Alcohol Screen: Low Risk  (10/21/2022)  Depression (PHQ2-9): Low Risk  (05/05/2023)  Financial Resource Strain: Low Risk  (10/21/2022)  Physical Activity: Sufficiently Active (10/10/2021)  Social Connections: Moderately Isolated (10/10/2021)  Stress: No Stress Concern Present (10/21/2022)  Tobacco Use: Low Risk  (05/12/2023)     Readmission Risk Interventions    05/15/2023   11:43 AM  Readmission Risk Prevention Plan  Transportation  Screening Complete  Home Care Screening Complete  Medication Review (RN CM) Complete

## 2023-05-15 NOTE — Progress Notes (Signed)
Inpatient Rehabilitation Admission Medication Review by a Pharmacist  A complete drug regimen review was completed for this patient to identify any potential clinically significant medication issues.  High Risk Drug Classes Is patient taking? Indication by Medication  Antipsychotic No   Anticoagulant Yes Sq heparin - VTE ppx  Antibiotic No   Opioid Yes Prn Vicodin - pain  Antiplatelet No   Hypoglycemics/insulin Yes Insulin - DM  Vasoactive Medication Yes Carvedilol, clonidine, lisinopril- HTN  Chemotherapy No   Other Yes Atorvastatin - HLD Allopurinol - gout Methocarbamol - prn spasms Ondansetron - prn N/V Pantoprazole - Reflux Keppra - seizure ppx  Dexamethasone - s/p craniotomy     Type of Medication Issue Identified Description of Issue Recommendation(s)  Drug Interaction(s) (clinically significant)     Duplicate Therapy     Allergy     No Medication Administration End Date     Incorrect Dose     Additional Drug Therapy Needed     Significant med changes from prior encounter (inform family/care partners about these prior to discharge).    Other       Clinically significant medication issues were identified that warrant physician communication and completion of prescribed/recommended actions by midnight of the next day:  No  Name of provider notified for urgent issues identified:   Provider Method of Notification:     Pharmacist comments:   Time spent performing this drug regimen review (minutes):  30 minutes  Thank you Okey Regal, PharmD

## 2023-05-15 NOTE — Progress Notes (Signed)
INPATIENT REHABILITATION ADMISSION NOTE   Arrival Method:wheelchair     Mental Orientation:alert   Assessment: done  Skin:done   IV'S:right forearm   Pain:none   Tubes and Drains: none  Safety Measures:done   Vital Signs:done   Height and Weight:done   Rehab Orientation:done   Family:not in room    Notes:

## 2023-05-16 DIAGNOSIS — D649 Anemia, unspecified: Secondary | ICD-10-CM | POA: Diagnosis not present

## 2023-05-16 DIAGNOSIS — E871 Hypo-osmolality and hyponatremia: Secondary | ICD-10-CM | POA: Diagnosis not present

## 2023-05-16 DIAGNOSIS — D72829 Elevated white blood cell count, unspecified: Secondary | ICD-10-CM | POA: Diagnosis not present

## 2023-05-16 DIAGNOSIS — D496 Neoplasm of unspecified behavior of brain: Secondary | ICD-10-CM | POA: Diagnosis not present

## 2023-05-16 LAB — COMPREHENSIVE METABOLIC PANEL
ALT: 29 U/L (ref 0–44)
AST: 17 U/L (ref 15–41)
Albumin: 1.9 g/dL — ABNORMAL LOW (ref 3.5–5.0)
Alkaline Phosphatase: 51 U/L (ref 38–126)
Anion gap: 9 (ref 5–15)
BUN: 21 mg/dL (ref 8–23)
CO2: 20 mmol/L — ABNORMAL LOW (ref 22–32)
Calcium: 7.8 mg/dL — ABNORMAL LOW (ref 8.9–10.3)
Chloride: 97 mmol/L — ABNORMAL LOW (ref 98–111)
Creatinine, Ser: 0.89 mg/dL (ref 0.61–1.24)
GFR, Estimated: >60 mL/min (ref >60)
Glucose, Bld: 209 mg/dL — ABNORMAL HIGH (ref 70–99)
Potassium: 4.1 mmol/L (ref 3.5–5.1)
Sodium: 126 mmol/L — ABNORMAL LOW (ref 135–145)
Total Bilirubin: 0.9 mg/dL (ref 0.3–1.2)
Total Protein: 4.9 g/dL — ABNORMAL LOW (ref 6.5–8.1)

## 2023-05-16 LAB — BPAM RBC
Blood Product Expiration Date: 202409062359
Blood Product Expiration Date: 202409122359
ISSUE DATE / TIME: 202409010257
Unit Type and Rh: 1700
Unit Type and Rh: 7300

## 2023-05-16 LAB — TYPE AND SCREEN
ABO/RH(D): B POS
Antibody Screen: NEGATIVE
Unit division: 0
Unit division: 0

## 2023-05-16 LAB — CBC WITH DIFFERENTIAL/PLATELET
Abs Immature Granulocytes: 0.48 10*3/uL — ABNORMAL HIGH (ref 0.00–0.07)
Basophils Absolute: 0.1 10*3/uL (ref 0.0–0.1)
Basophils Relative: 0 %
Eosinophils Absolute: 0 10*3/uL (ref 0.0–0.5)
Eosinophils Relative: 0 %
HCT: 34.3 % — ABNORMAL LOW (ref 39.0–52.0)
Hemoglobin: 11.5 g/dL — ABNORMAL LOW (ref 13.0–17.0)
Immature Granulocytes: 2 %
Lymphocytes Relative: 2 %
Lymphs Abs: 0.6 10*3/uL — ABNORMAL LOW (ref 0.7–4.0)
MCH: 30.3 pg (ref 26.0–34.0)
MCHC: 33.5 g/dL (ref 30.0–36.0)
MCV: 90.3 fL (ref 80.0–100.0)
Monocytes Absolute: 1.2 10*3/uL — ABNORMAL HIGH (ref 0.1–1.0)
Monocytes Relative: 4 %
Neutro Abs: 24.9 10*3/uL — ABNORMAL HIGH (ref 1.7–7.7)
Neutrophils Relative %: 92 %
Platelets: 156 10*3/uL (ref 150–400)
RBC: 3.8 MIL/uL — ABNORMAL LOW (ref 4.22–5.81)
RDW: 14.3 % (ref 11.5–15.5)
WBC: 27.2 10*3/uL — ABNORMAL HIGH (ref 4.0–10.5)
nRBC: 0 % (ref 0.0–0.2)

## 2023-05-16 LAB — GLUCOSE, CAPILLARY
Glucose-Capillary: 216 mg/dL — ABNORMAL HIGH (ref 70–99)
Glucose-Capillary: 307 mg/dL — ABNORMAL HIGH (ref 70–99)
Glucose-Capillary: 329 mg/dL — ABNORMAL HIGH (ref 70–99)
Glucose-Capillary: 295 mg/dL — ABNORMAL HIGH (ref 70–99)

## 2023-05-16 LAB — BASIC METABOLIC PANEL
Anion gap: 15 (ref 5–15)
BUN: 33 mg/dL — ABNORMAL HIGH (ref 8–23)
CO2: 21 mmol/L — ABNORMAL LOW (ref 22–32)
Calcium: 8.9 mg/dL (ref 8.9–10.3)
Chloride: 95 mmol/L — ABNORMAL LOW (ref 98–111)
Creatinine, Ser: 1.09 mg/dL (ref 0.61–1.24)
GFR, Estimated: >60 mL/min (ref >60)
Glucose, Bld: 305 mg/dL — ABNORMAL HIGH (ref 70–99)
Potassium: 4.7 mmol/L (ref 3.5–5.1)
Sodium: 131 mmol/L — ABNORMAL LOW (ref 135–145)

## 2023-05-16 LAB — OSMOLALITY, URINE: Osmolality, Ur: 768 mOsm/kg (ref 300–900)

## 2023-05-16 LAB — SODIUM, URINE, RANDOM: Sodium, Ur: 40 mmol/L

## 2023-05-16 MED ORDER — ORAL CARE MOUTH RINSE: 15.0000 mL | OROMUCOSAL | Status: DC | PRN

## 2023-05-16 MED ORDER — LISINOPRIL 5 MG PO TABS
10.0000 mg | ORAL_TABLET | Freq: Every day | ORAL | Status: DC
  Administered 2023-05-17 – 2023-05-19 (×3): 10 mg via ORAL
  Filled 2023-05-16 (×3): qty 2

## 2023-05-16 MED ORDER — INSULIN GLARGINE-YFGN 100 UNIT/ML ~~LOC~~ SOLN
18.0000 [IU] | Freq: Every day | SUBCUTANEOUS | Status: DC
  Administered 2023-05-17 – 2023-05-20 (×4): 18 [IU] via SUBCUTANEOUS
  Filled 2023-05-16 (×5): qty 0.18

## 2023-05-16 MED ORDER — SODIUM CHLORIDE 1 G PO TABS
1.0000 g | ORAL_TABLET | Freq: Two times a day (BID) | ORAL | Status: DC
  Administered 2023-05-16 – 2023-05-20 (×8): 1 g via ORAL
  Filled 2023-05-16 (×8): qty 1

## 2023-05-16 NOTE — Progress Notes (Signed)
PROGRESS NOTE   Subjective/Complaints: No new concerns or complaints elicited this morning.  No acute events overnight.   ROS: Patient denies fever, rash, sore throat, blurred vision, dizziness, nausea, vomiting, diarrhea, cough, shortness of breath or chest pain, joint or back/neck pain, headache, or mood change.    Objective:   No results found. Recent Labs    05/15/23 0537 05/16/23 0444  WBC 25.4* 27.2*  HGB 11.5* 11.5*  HCT 35.0* 34.3*  PLT 161 156   Recent Labs    05/15/23 0536 05/16/23 0444  NA 130* 126*  K 4.2 4.1  CL 98 97*  CO2 21* 20*  GLUCOSE 150* 209*  BUN 23 21  CREATININE 0.87 0.89  CALCIUM 7.8* 7.8*    Intake/Output Summary (Last 24 hours) at 05/16/2023 1349 Last data filed at 05/16/2023 0918 Gross per 24 hour  Intake 480 ml  Output 1975 ml  Net -1495 ml        Physical Exam: Vital Signs Blood pressure (!) 91/52, pulse 62, temperature 98 F (36.7 C), resp. rate 18, weight 86.1 kg, SpO2 97%.  General:  No apparent distress HEENT: Head is normocephalic, PERRLA, EOMI,dressing in place over craniotomy site with a small amount of dried blood Heart: Reg rate and rhythm.  Chest: CTA bilaterally without wheezes, rales, or rhonchi; no distress Abdomen: Soft, non-tender, non-distended, bowel sounds positive. Extremities: No clubbing, cyanosis, or edema. Pulses are 2+ Psych: Pleasant, appropriate Skin: Clean and intact without signs of breakdown Bruising noted left upper extremity and right neck Neuro:  Alert and oriented x 4, follows commands, cranial nerves II through XII grossly intact Moving all extremities to gravity and resistance Sensory exam normal for light touch and pain in all 4 limbs. No limb ataxia or cerebellar signs. No abnormal tone appreciated.    Musculoskeletal: No joint swelling or tenderness     Assessment/Plan: 1. Functional deficits which require 3+ hours per day of  interdisciplinary therapy in a comprehensive inpatient rehab setting. Physiatrist is providing close team supervision and 24 hour management of active medical problems listed below. Physiatrist and rehab team continue to assess barriers to discharge/monitor patient progress toward functional and medical goals  Care Tool:  Bathing              Bathing assist       Upper Body Dressing/Undressing Upper body dressing        Upper body assist      Lower Body Dressing/Undressing Lower body dressing            Lower body assist       Toileting Toileting    Toileting assist       Transfers Chair/bed transfer  Transfers assist           Locomotion Ambulation   Ambulation assist              Walk 10 feet activity   Assist           Walk 50 feet activity   Assist           Walk 150 feet activity   Assist  Walk 10 feet on uneven surface  activity   Assist           Wheelchair     Assist               Wheelchair 50 feet with 2 turns activity    Assist            Wheelchair 150 feet activity     Assist          Blood pressure (!) 91/52, pulse 62, temperature 98 F (36.7 C), resp. rate 18, weight 86.1 kg, SpO2 97%.  Medical Problem List and Plan: 1. 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 Jake Samples on 05/12/2023             -patient may shower, cover surgical incisions             -ELOS/Goals: 7-10 days, Mod I PT/OT/SLP             -Continue CIR   2.  Antithrombotics: -DVT/anticoagulation:  Pharmaceutical: Heparin started 9/05             -antiplatelet therapy: none   3. Pain Management: Tylenol, Norco as needed   4. Mood/Behavior/Sleep: LCSW to evaluate and provide emotional support             -continue melatonin 5 mg q HS             -antipsychotic agents: n/a             -Consider  trazodone if insomnia does not improve with melatonin   5. Neuropsych/cognition: This patient is capable of making decisions on his own behalf.   6. Skin/Wound Care: Routine skin care checks             -monitor surgical incision   7. Fluids/Electrolytes/Nutrition: Routine Is and Os and follow-up chemistries             -adjust to carb modified diet             -monitor PO intake (no current documentation)   8: Hypertension: monitor TID and prn, has been controlled overall             -continue carvedilol 12.5 mg Bid             -continue clonidine 0.2 mg BID             -continue lisinopril 20 mg BID  -9/7 change lisinopril to 10 mg daily due to soft BP     05/16/2023    1:17 PM 05/16/2023   12:16 PM 05/16/2023    5:57 AM  Vitals with BMI  Systolic 91 96 102  Diastolic 52 53 54  Pulse 62 62 61      9: Hyperlipidemia: continue Lipitor 10 mg   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 Jake Samples, follow-up with Dr. Barbaraann Cao   11: DM-2: CBGs QID; A1c = 6.9%, steroid induced hyperglycemia (home meds include Januvia 100 mg daily, metformin 1000 mg twice daily, Actos 30 mg daily>>not restarted)             -  continue SSI             -continue Semglee 16 units daily  -9/8 increase Semglee to 18 units daily, may need to wean down as steroids are reduced  CBG (last 3)  Recent Labs    05/15/23 2131 05/16/23 0605 05/16/23 1211  GLUCAP 226* 216* 307*      12: GI prophylaxis: continue Protonix BID while on steroids   13: Hyponatremia: Last sodium 130 9/6, follow-up BMP -NA 126, asymptomatic start salt tabs, fluid restriction, recheck labs later today, check urine osmolality and sodium -Decrease lisinopril dose to 10 mg daily  14: Mild anemia: Hgb 11.5 9/6, follow-up CBC  -9/7 Hgb stable 11.5   14:  Leukocytosis: WBC 25.4 on 9/6 , likely steroid induced; currently afebrile>> monitor             -9/7 WBC continues to be elevated, monitor for signs of infection   15.  Acute metabolic encephalopathy: TSH/B12 stable.  Improving      LOS: 1 days A FACE TO FACE EVALUATION WAS PERFORMED  Fanny Dance 05/16/2023, 1:49 PM

## 2023-05-16 NOTE — Plan of Care (Signed)
  Problem: RH Problem Solving Goal: LTG Patient will demonstrate problem solving for (SLP) Description: LTG:  Patient will demonstrate problem solving for basic/complex daily situations with cues  (SLP) Flowsheets (Taken 05/16/2023 1249) LTG: Patient will demonstrate problem solving for (SLP): Complex daily situations LTG Patient will demonstrate problem solving for: Modified Independent   Problem: RH Memory Goal: LTG Patient will demonstrate ability for day to day (SLP) Description: LTG:   Patient will demonstrate ability for day to day recall/carryover during cognitive/linguistic activities with assist  (SLP) Flowsheets (Taken 05/16/2023 1249) LTG: Patient will demonstrate ability for day to day recall: Daily complex information LTG: Patient will demonstrate ability for day to day recall/carryover during cognitive/linguistic activities with assist (SLP): Modified Independent Goal: LTG Patient will use memory compensatory aids to (SLP) Description: LTG:  Patient will use memory compensatory aids to recall biographical/new, daily complex information with cues (SLP) Flowsheets (Taken 05/16/2023 1249) LTG: Patient will use memory compensatory aids to (SLP): Modified Independent   Problem: RH Attention Goal: LTG Patient will demonstrate this level of attention during functional activites (SLP) Description: LTG:  Patient will will demonstrate this level of attention during functional activites (SLP) Flowsheets (Taken 05/16/2023 1249) Patient will demonstrate during cognitive/linguistic activities the attention type of: Selective LTG: Patient will demonstrate this level of attention during cognitive/linguistic activities with assistance of (SLP): Modified Independent Number of minutes patient will demonstrate attention during cognitive/linguistic activities: 45

## 2023-05-16 NOTE — Evaluation (Signed)
Occupational Therapy Assessment and Plan  Patient Details  Name: Alex Church MRN: [id number] Date of Birth: September 16, 1945  OT Diagnosis: muscle weakness (generalized) Rehab Potential: Rehab Potential (ACUTE ONLY): Good ELOS: 7-10 days   Today's Date: 05/16/2023 OT Individual Time: 1045-1200 OT Individual Time Calculation (min): 75 min     Hospital Problem: Principal Problem:   Glial neoplasm of brain [facility])   Past Medical History:  Past Medical History:  Diagnosis Date   B12 deficiency    monthly shots   Diabetes mellitus    Glaucoma suspect    Hyperlipemia    Hypertension    Past Surgical History:  Past Surgical History:  Procedure Laterality Date   APPLICATION OF CRANIAL NAVIGATION Right 05/12/2023   Procedure: APPLICATION OF CRANIAL NAVIGATION;  Surgeon: Bethann Goo, DO;  Location: [facility] OR;  Service: Neurosurgery;  Laterality: Right;   CRANIOTOMY Right 05/12/2023   Procedure: RIGHT STERIOSTACTIC FRONTAL CRANIOTOMY FOR TUMOR RESECTION;  Surgeon: Bethann Goo, DO;  Location: [facility] OR;  Service: Neurosurgery;  Laterality: Right;   NO PAST SURGERIES      Assessment & Plan Clinical Impression:Alex Church is a 77 year old male with was admitted to [facility] on 05/05/2023 by way of transfer from [facility] with new diagnosis of right frontal brain mass after presenting from home to [facility] ED complaining of left-sided weakness.  The patient presents with complaint of 4 days of left-sided weakness involving the left upper and lower extremity, starting on Saturday, 05/02/2023.  Denied any associated acute focal numbness or paresthesias nor any acute vertigo, dysphagia, dysarthria, facial droop, acute change in vision. The patient's family also conveyed that the patient had appeared progressively confused over the last few months, starting in May 2024.  Over that time, they had noticed the patient becoming more forgetful, getting lost while driving, although very familiar with  these routes. MRI brain with and without contrast showed large mass located within the right frontal lobe measuring 6.8 x 4.9 cm associate with mass effect on the right lateral ventricle and 6 mm of leftward midline shift in the absence of any evidence of acute infarct in the absence of any evidence of intracranial hemorrhage. EDP at [facility] d/w on-call neurosurgeon, Dr. Jake Samples , who recommended TRH admission to [facility]. Dr. Jake Samples conveyed that neurosurgery would formally consult, and did not feel that emergent surgery is indicated.  Rather, neurosurgery anticipated surgical intervention on Thursday, 05/07/2023, and recommends interval Decadron 4 mg every 6 hours. The patient underwent right stereotactic frontal craniotomy for resection of tumor by Dr. Jake Samples. On exam 9/05, speech fluent, CNs grossly intact, 5/5 BUE/BLE. Remains on steroid taper and on Keppra. Tolerating diet. He ambulated ~125' with RW and min assist fading to close CGA for safety. No significant lean/list Lt with gait but overall continues to have reduced Df on Lt LE and decreased step length on Lt. Patient reports he has not been sleeping well and feels tired during the day.  He has chronic issues with sleep.  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 Patient transferred to CIR on 05/15/2023 .    Patient currently requires modified independent  with basic self-care skills secondary to muscle weakness and decreased coordination.  Prior to hospitalization, patient could complete BADL with independent .  Patient will benefit from skilled intervention to decrease level of assist with basic self-care skills prior to discharge home with  care partner.  Anticipate patient will require 24 hour supervision and no further OT follow recommended.  OT - End of Session Activity Tolerance: Tolerates 10 - 20 min activity with multiple rests Endurance Deficit:  Yes Endurance Deficit Description: requires rest breaks with upright mobility OT Assessment Rehab Potential (ACUTE ONLY): Good OT Patient demonstrates impairments in the following area(s): Balance;Endurance;Safety;Other (Comment) (Due to challenges with UB strength) OT Basic ADL's Functional Problem(s): Dressing;Other (comment) OT Advanced ADL's Functional Problem(s): Simple Meal Preparation OT Additional Impairment(s): Other (comment) (UB strength and activity tolerance) OT Plan OT Frequency: 5 out of 7 days OT Duration/Estimated Length of Stay: 7-10 days OT Treatment/Interventions: Warden/ranger;Therapeutic Activities;Self Care/advanced ADL retraining;Therapeutic Exercise;UE/LE Strength taining/ROM OT Self Feeding Anticipated Outcome(s): ModI OT Basic Self-Care Anticipated Outcome(s): ModI OT Toileting Anticipated Outcome(s): ModI OT Bathroom Transfers Anticipated Outcome(s): ModI OT Recommendation Patient destination: Home Follow Up Recommendations: None Equipment Recommended: 3 in 1 bedside comode;Rolling walker with 5" wheels Equipment Details: secondary to challenges with balance during functional mobility, the pt would benefit from bedside commode to prevent falls at night and a RW during functional mobility   OT Evaluation Precautions/Restrictions  Precautions Precautions: Fall Restrictions Weight Bearing Restrictions: No General   Vital Signs Therapy Vitals Temp: 98 F (36.7 C) Pulse Rate: 62 Resp: 18 BP: (!) 91/52 Patient Position (if appropriate): Sitting Oxygen Therapy O2 Device: Room Air Pain   Home Living/Prior Functioning Home Living Family/patient expects to be discharged to:: Private residence Living Arrangements: Spouse/significant other Available Help at Discharge: Family, Available 24 hours/day Type of Home: House Home Access: Stairs to enter Secretary/administrator of Steps: 3 Entrance Stairs-Rails: None Home Layout: Two  level Alternate Level Stairs-Number of Steps: 12 Alternate Level Stairs-Rails: Can reach both Bathroom Shower/Tub: Walk-in shower (5inch lip) Bathroom Toilet: Standard Bathroom Accessibility: No  Lives With: Spouse IADL History Homemaking Responsibilities: Yes Meal Prep Responsibility: Primary (He prepares meals, wife cleans) Laundry Responsibility: Secondary (Wife does the cleaning) Homemaking Comments: Wife does cleaning, he prepares meals. 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[employer], drives for job Leisure: Hobbies-yes (Comment) (gardening, landscaping) Vision Baseline Vision/History: 1 Wears glasses;4 Cataracts Ability to See in Adequate Light: 0 Adequate Patient Visual Report: No change from baseline Vision Assessment?:  (no change from baseline) Perception    Praxis   Cognition Cognition Overall Cognitive Status: Impaired/Different from baseline Arousal/Alertness: Awake/alert Orientation Level: Person;Place;Situation Person: Oriented Place: Oriented Situation: Oriented Memory: Impaired Memory Impairment: Decreased short term memory Attention: Sustained;Focused;Selective Focused Attention: Appears intact Sustained Attention: Appears intact Selective Attention: Impaired Selective Attention Impairment: Verbal complex;Functional complex Awareness: Appears intact Problem Solving: Impaired Problem Solving Impairment: Functional complex Executive Function: Reasoning Reasoning: Appears intact Behaviors:  (eager to participate) Safety/Judgment: Appears intact Brief Interview for Mental Status (BIMS) Repetition of Three Words (First Attempt): 3 Temporal  Orientation: Year: Correct Temporal Orientation: Month: Accurate within 5 days Temporal Orientation: Day: Correct Recall: "Sock": Yes, no cue required Recall: "Blue": Yes, no cue required Recall: "Bed": Yes, no cue required BIMS Summary Score: 15 Sensation Sensation Light Touch: Appears Intact Coordination Gross Motor Movements are Fluid and Coordinated: No Fine Motor Movements are Fluid and Coordinated: No Coordination and Movement Description: decreased smoothness and accuracy noted with functional mobility Motor  Motor Motor: Other (  comment) Motor - Skilled Clinical Observations: slight coordination deficits noted functionally with LUE/LLE  Trunk/Postural Assessment  Cervical Assessment Cervical Assessment: Within Functional Limits Thoracic Assessment Thoracic Assessment: Within Functional Limits Lumbar Assessment Lumbar Assessment: Within Functional Limits Postural Control Postural Control: Deficits on evaluation Righting Reactions: delayed and inadequate Protective Responses: delayed Postural Limitations: decreased  Balance Balance Balance Assessed: Yes Standardized Balance Assessment Standardized Balance Assessment: Berg Balance Test Berg Balance Test Sit to Stand: Able to stand  independently using hands Standing Unsupported: Able to stand safely 2 minutes Sitting with Back Unsupported but Feet Supported on Floor or Stool: Able to sit safely and securely 2 minutes Stand to Sit: Controls descent by using hands Transfers: Able to transfer safely, definite need of hands Standing Unsupported with Eyes Closed: Able to stand 10 seconds with supervision Standing Ubsupported with Feet Together: Able to place feet together independently and stand for 1 minute with supervision From Standing, Reach Forward with Outstretched Arm: Can reach forward >5 cm safely (2") From Standing Position, Pick up Object from Floor: Able to pick up shoe, needs supervision From Standing  Position, Turn to Look Behind Over each Shoulder: Needs supervision when turning Turn 360 Degrees: Needs close supervision or verbal cueing Standing Unsupported, Alternately Place Feet on Step/Stool: Able to complete >2 steps/needs minimal assist Standing Unsupported, One Foot in Front: Able to plae foot ahead of the other independently and hold 30 seconds Standing on One Leg: Tries to lift leg/unable to hold 3 seconds but remains standing independently Total Score: 35 Static Standing Balance Static Standing - Balance Support: Bilateral upper extremity supported Static Standing - Level of Assistance: 5: Stand by assistance Dynamic Standing Balance Dynamic Standing - Balance Support: During functional activity;No upper extremity supported Dynamic Standing - Level of Assistance: 5: Stand by assistance (CGA) Extremity/Trunk Assessment RUE Assessment Passive Range of Motion (PROM) Comments: WFL Active Range of Motion (AROM) Comments: WFL General Strength Comments: 4-/5 MMT LUE Assessment Passive Range of Motion (PROM) Comments: WFL Active Range of Motion (AROM) Comments: WFL General Strength Comments: 4-/5 MMT  Care Tool Care Tool Self Care Eating   Eating Assist Level: Set up assist    Oral Care    Oral Care Assist Level: Set up assist    Bathing   Body parts bathed by patient: Right arm;Left arm;Chest;Abdomen;Front perineal area;Buttocks;Right upper leg;Left upper leg;Right lower leg;Left lower leg;Face     Assist Level: Independent with assistive device Assistive Device Comment: RW with MinA for LB task performance  Upper Body Dressing(including orthotics)   What is the patient wearing?: Pull over shirt   Assist Level: Set up assist    Lower Body Dressing (excluding footwear)   What is the patient wearing?: Underwear/pull up;Pants Assist for lower body dressing: Minimal Assistance - Patient > 75%    Putting on/Taking off footwear   What is the patient wearing?:  Socks;Shoes         Care Tool Toileting Toileting activity   Assist for toileting: Independent with assistive device     Care Tool Bed Mobility Roll left and right activity   Roll left and right assist level: Supervision/Verbal cueing    Sit to lying activity   Sit to lying assist level: Contact Guard/Touching assist    Lying to sitting on side of bed activity   Lying to sitting on side of bed assist level: the ability to move from lying on the back to sitting on the side of the bed with no back support.:  Contact Guard/Touching assist     Care Tool Transfers Sit to stand transfer   Sit to stand assist level: Contact Guard/Touching assist    Chair/bed transfer   Chair/bed transfer assist level: Contact Guard/Touching assist     Toilet transfer   Assist Level: Contact Guard/Touching assist     Care Tool Cognition  Expression of Ideas and Wants Expression of Ideas and Wants: 3. Some difficulty - exhibits some difficulty with expressing needs and ideas (e.g, some words or finishing thoughts) or speech is not clear  Understanding Verbal and Non-Verbal Content Understanding Verbal and Non-Verbal Content: 3. Usually understands - understands most conversations, but misses some part/intent of message. Requires cues at times to understand   Memory/Recall Ability Memory/Recall Ability : That he or she is in a hospital/hospital unit;Current season   Refer to Care Plan for Long Term Goals  SHORT TERM GOAL WEEK 1 OT Short Term Goal 1 (Week 1): The pt will improve UB strength to 4/5MMT at 95% safe OT Short Term Goal 2 (Week 1): The pt will complete LB bathing and dressing with ModI  and 95% safe using AE as needed OT Short Term Goal 3 (Week 1): The pt will tolerate > 30 minutes of skilled OT with minimal rest breaks at 95% safe OT Short Term Goal 4 (Week 1): The pt will complete a simple meal with ModI at 95% safe during performance with rest breaks as needed. OT Short Term Goal 5  (Week 1): The pt will demonstrate good funcitonal balance during functional task performance at 95% safe .  Recommendations for other services: None    Skilled Therapeutic Intervention   Patient seein this date for skilled OT evaluation, pt in good  spirits. Patient presents as very high functioning.  Patient completed BADL related task in bathing and dressing, he required ModI for functional mobility to improve upon static and dynamic balance during task performance. The pt was able to complete UB task iwith s/u assist, however, he was met with challenges in relation to donning LB clothing items in relation to threading his feet into the brief and pants.  The pt and his spouse indicated that he has difficulty donning his socks and shoes, which as the time of this assessment he required MaxA and was instructed in the use of AE to improve his independence. The pt prepares most of the meals in the home and would benefit from opportunities in simple meal prep to address any safety concerns. I recommend that the pt receive 7-10 days of OT service to improve upon activity tolerance, UB strength, simple meal prep, LB dressing and functional standing balance for a safe return home to reduce there burden of care for the care person.  ADL   Mobility  Bed Mobility Bed Mobility: Rolling Left (roll from supine to left for getting out of bed) Rolling Left: Contact Guard/Touching assist Transfers Sit to Stand: Contact Guard/Touching assist   Discharge Criteria: Patient will be discharged from OT if patient refuses treatment 3 consecutive times without medical reason, if treatment goals not met, if there is a change in medical status, if patient makes no progress towards goals or if patient is discharged from hospital.  The above assessment, treatment plan, treatment alternatives and goals were discussed and mutually agreed upon: by patient and by family  Lavona Mound 05/16/2023, 4:27 PM

## 2023-05-16 NOTE — Evaluation (Signed)
Speech Language Pathology Assessment and Plan  Patient Details  Name: Alex Church MRN: [id number] Date of Birth: October 21, 1945  SLP Diagnosis: Cognitive Impairments  Rehab Potential: Excellent ELOS: 7-10 days    Today's Date: 05/16/2023 SLP Individual Time: 0865-7846 SLP Individual Time Calculation (min): 55 min   Hospital Problem: Principal Problem:   Glial neoplasm of brain [facility])  Past Medical History:  Past Medical History:  Diagnosis Date   B12 deficiency    monthly shots   Diabetes mellitus    Glaucoma suspect    Hyperlipemia    Hypertension    Past Surgical History:  Past Surgical History:  Procedure Laterality Date   APPLICATION OF CRANIAL NAVIGATION Right 05/12/2023   Procedure: APPLICATION OF CRANIAL NAVIGATION;  Surgeon: Bethann Goo, DO;  Location: [facility] OR;  Service: Neurosurgery;  Laterality: Right;   CRANIOTOMY Right 05/12/2023   Procedure: RIGHT STERIOSTACTIC FRONTAL CRANIOTOMY FOR TUMOR RESECTION;  Surgeon: Bethann Goo, DO;  Location: [facility] OR;  Service: Neurosurgery;  Laterality: Right;   NO PAST SURGERIES      Assessment / Plan / Recommendation Clinical Impression Patient is a 77 year old male with was admitted to [facility] on 05/05/2023 by way of transfer from [facility] with new diagnosis of right frontal brain mass after presenting from home complaining of left-sided weakness.  MRI brain with and without contrast showed large mass located within the right frontal lobe measuring 6.8 x 4.9 cm associate with mass effect on the right lateral ventricle and 6 mm of leftward midline shift in the absence of any evidence of acute infarct in the absence of any evidence of intracranial hemorrhage. The patient underwent right stereotactic frontal craniotomy for resection of tumor by Dr. Jake Samples. Therapy evaluations completed with recommendations for CIR. Patient admitted 05/15/23.  Upon arrival, patient was awake in bed with wife and other family members  present. Patient reports that he works part-time with [employer] and is extremely active at home, especially in the yard. Patient and his wife report that patient's overall cognitive functioning was declining with personality changes noted prior to admission but appears much improved s/p surgery.    Patient administered the COGNISTAT and scored WFL on all subtests with the exception of mild deficits in short-term recall. Throughout informal evaluation, patient was attentive, engaged, and demonstrated appropriate social interactions. Patient also demonstrated appropriate awareness regarding current deficits and needing assistance for mobility at this time. Patient's overall auditory comprehension, intelligibility, and expressive language also appeared [facility] for tasks assessed.  Patient would benefit from skilled SLP intervention to maximize his cognitive functioning and overall functional independence prior to discharge.    Skilled Therapeutic Interventions          Administered a cognitive-linguistic evaluation, please see above for details.   SLP Assessment  Patient will need skilled Speech Lanaguage Pathology Services during CIR admission    Recommendations  Recommendations for Other Services: Neuropsych consult Patient destination: Home Follow up Recommendations: 24 hour supervision/assistance;Outpatient SLP Equipment Recommended: None recommended by SLP    SLP Frequency 3 to 5 out of 7 days   SLP Duration  SLP Intensity  SLP Treatment/Interventions 7-10 days  Minumum of 1-2 x/day, 30 to 90 minutes  Cognitive remediation/compensation;Cueing hierarchy;Environmental controls;Functional tasks;Internal/external aids;Patient/family education;Therapeutic Activities    Pain Pain Assessment Pain Scale: 0-10  Prior Functioning Type of Home: House  Lives With: Spouse Available Help at Discharge: Family;Available 24 hours/day  SLP Evaluation Cognition Overall Cognitive Status:  Impaired/Different from baseline Arousal/Alertness: Awake/alert Orientation Level: Oriented X4 Attention: Sustained;Focused;Selective Focused Attention: Appears intact Sustained Attention: Appears intact Selective Attention: Impaired Selective Attention Impairment: Verbal complex;Functional complex Memory: Impaired Memory Impairment: Decreased short term memory Awareness: Appears intact Problem Solving: Impaired Problem Solving Impairment: Functional complex Executive Function: Reasoning Reasoning: Appears intact Behaviors:  (eager to participate) Safety/Judgment: Appears intact  Comprehension Auditory Comprehension Overall Auditory Comprehension: Appears within functional limits for tasks assessed Visual Recognition/Discrimination Discrimination: Not tested Reading Comprehension Reading Status: Not tested Expression Expression Primary Mode of Expression: Verbal Verbal Expression Overall Verbal Expression: Appears within functional limits for tasks assessed Written Expression Dominant Hand: Right Written Expression: Not tested Oral Motor Oral Motor/Sensory Function Overall Oral Motor/Sensory Function: Within functional limits Motor Speech Overall Motor Speech: Appears within functional limits for tasks assessed  Care Tool Care Tool Cognition Ability to hear (with hearing aid or hearing appliances if normally used Ability to hear (with hearing aid or hearing appliances if normally used): 1. Minimal difficulty - difficulty in some environments (e.g. when person speaks softly or setting is noisy)   Expression of Ideas and Wants Expression of Ideas and Wants: 3. Some difficulty - exhibits some difficulty with expressing needs and ideas (e.g, some words or finishing thoughts) or speech is not clear   Understanding Verbal and Non-Verbal Content Understanding Verbal and Non-Verbal Content: 3. Usually understands - understands most conversations, but misses some part/intent of  message. Requires cues at times to understand  Memory/Recall Ability Memory/Recall Ability : That he or she is in a hospital/hospital unit;Current season    Short Term Goals: Week 1: SLP Short Term Goal 1 (Week 1): STGs=LTGs due ELOS  Refer to Care Plan for Long Term Goals  Recommendations for other services: Neuropsych  Discharge Criteria: Patient will be discharged from SLP if patient refuses treatment 3 consecutive times without medical reason, if treatment goals not met, if there is a change in medical status, if patient makes no progress towards goals or if patient is discharged from hospital.  The above assessment, treatment plan, treatment alternatives and goals were discussed and mutually agreed upon: by patient and by family  Kaiea Esselman 05/16/2023, 12:46 PM

## 2023-05-16 NOTE — Evaluation (Signed)
Physical Therapy Assessment and Plan  Patient Details  Name: Alex Church MRN: [id number] Date of Birth: May 15, 1946  PT Diagnosis: Abnormality of gait, Cognitive deficits, and Coordination disorder Rehab Potential: Good ELOS: 7-10 days   Today's Date: 05/16/2023 PT Individual Time: 4098-1191 PT Individual Time Calculation (min): 76 min    Hospital Problem: Principal Problem:   Glial neoplasm of brain [facility])   Past Medical History:  Past Medical History:  Diagnosis Date   B12 deficiency    monthly shots   Diabetes mellitus    Glaucoma suspect    Hyperlipemia    Hypertension    Past Surgical History:  Past Surgical History:  Procedure Laterality Date   APPLICATION OF CRANIAL NAVIGATION Right 05/12/2023   Procedure: APPLICATION OF CRANIAL NAVIGATION;  Surgeon: Bethann Goo, DO;  Location: [facility] OR;  Service: Neurosurgery;  Laterality: Right;   CRANIOTOMY Right 05/12/2023   Procedure: RIGHT STERIOSTACTIC FRONTAL CRANIOTOMY FOR TUMOR RESECTION;  Surgeon: Bethann Goo, DO;  Location: [facility] OR;  Service: Neurosurgery;  Laterality: Right;   NO PAST SURGERIES      Assessment & Plan Clinical Impression: Patient is a 77 year old male with was admitted to [facility] on 05/05/2023 by way of transfer from [facility] with new diagnosis of right frontal brain mass after presenting from home to [facility] ED complaining of left-sided weakness. The patient presents with complaint of 4 days of left-sided weakness involving the left upper and lower extremity, starting on Saturday, 05/02/2023. Denied any associated acute focal numbness or paresthesias nor any acute vertigo, dysphagia, dysarthria, facial droop, acute change in vision. The patient's family also conveyed that the patient had appeared progressively confused over the last few months, starting in May 2024. Over that time, they had noticed the patient becoming more forgetful, getting lost while driving, although very familiar with these  routes. MRI brain with and without contrast showed large mass located within the right frontal lobe measuring 6.8 x 4.9 cm associate with mass effect on the right lateral ventricle and 6 mm of leftward midline shift in the absence of any evidence of acute infarct in the absence of any evidence of intracranial hemorrhage. EDP at [facility] d/w on-call neurosurgeon, Dr. Jake Samples , who recommended TRH admission to [facility]. Dr. Jake Samples conveyed that neurosurgery would formally consult, and did not feel that emergent surgery is indicated. Rather, neurosurgery anticipated surgical intervention on Thursday, 05/07/2023, and recommends interval Decadron 4 mg every 6 hours. The patient underwent right stereotactic frontal craniotomy for resection of tumor by Dr. Jake Samples. On exam 9/05, speech fluent, CNs grossly intact, 5/5 BUE/BLE. Remains on steroid taper and on Keppra. Tolerating diet. Patient reports he has not been sleeping well and feels tired during the day. He has chronic issues with sleep. 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   Patient currently requires min with mobility secondary to muscle weakness, decreased cardiorespiratoy endurance, impaired timing and sequencing and decreased coordination, decreased midline orientation, decreased awareness and decreased memory, and decreased standing balance, decreased postural control, and decreased balance strategies.  Prior to hospitalization, patient was independent  with mobility and lived with Spouse in a House home.  Home access is 3Stairs to enter.  Patient will benefit from skilled PT intervention to maximize safe functional mobility, minimize fall risk, and decrease caregiver burden for planned discharge home with 24 hour supervision.  Anticipate patient will benefit from follow  up OP at discharge.  PT - End of Session Activity Tolerance: Tolerates 30+ min activity  without fatigue Endurance Deficit: Yes Endurance Deficit Description: requires rest breaks with upright mobility PT Assessment Rehab Potential (ACUTE/IP ONLY): Good PT Barriers to Discharge: Inaccessible home environment;Home environment access/layout;Decreased caregiver support PT Barriers to Discharge Comments: pt wife unable to physically assist, 3 STE with no HRs, flight of stairs to bedroom PT Patient demonstrates impairments in the following area(s): Balance;Behavior;Endurance;Motor;Safety;Perception PT Transfers Functional Problem(s): Bed Mobility;Bed to Chair;Car;Furniture PT Locomotion Functional Problem(s): Ambulation;Stairs PT Plan PT Intensity: Minimum of 1-2 x/day ,45 to 90 minutes PT Frequency: 5 out of 7 days PT Duration Estimated Length of Stay: 7-10 days PT Treatment/Interventions: Ambulation/gait training;Discharge planning;Functional mobility training;Psychosocial support;Therapeutic Activities;Visual/perceptual remediation/compensation;Balance/vestibular training;Neuromuscular re-education;Therapeutic Exercise;Wheelchair propulsion/positioning;Disease management/prevention;Cognitive remediation/compensation;DME/adaptive equipment instruction;Pain management;UE/LE Strength taining/ROM;Community reintegration;Patient/family education;Stair training;UE/LE Coordination activities PT Transfers Anticipated Outcome(s): supervision PT Locomotion Anticipated Outcome(s): supervision ambulatory with LRAD PT Recommendation Follow Up Recommendations: Outpatient PT Patient destination: Home Equipment Recommended: None recommended by PT Equipment Details: pt owns RW and [facility]   PT Evaluation Precautions/Restrictions Precautions Precautions: Fall Precaution Comments: fall risk secondary to funcitonal balance Restrictions Weight Bearing Restrictions: No Pain Interference Pain Interference Pain Effect on Sleep: 0. Does not apply - I have not had any pain or hurting in the past 5  days Pain Interference with Therapy Activities: 0. Does not apply - I have not received rehabilitationtherapy in the past 5 days Pain Interference with Day-to-Day Activities: 1. Rarely or not at all Home Living/Prior Functioning Home Living Available Help at Discharge: Family;Available 24 hours/day Type of Home: House Home Access: Stairs to enter [employer] of Steps: 3 Entrance Stairs-Rails: None Home Layout: Two level Alternate Level Stairs-Number of Steps: 12 Alternate Level Stairs-Rails: Can reach both Bathroom Shower/Tub: Walk-in shower (5inch lip) Bathroom Toilet: Standard Bathroom Accessibility: No  Lives With: Spouse Prior Function Level of Independence: Independent with basic ADLs  Able to Take Stairs?: Yes Driving: Yes Vocation: Part time employment 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[employer], drives for job Leisure: Hobbies-yes (Comment) (gardening, landscaping) Cognition Overall Cognitive Status: Impaired/Different from baseline Arousal/Alertness: Awake/alert Orientation Level: Oriented X4 Year: 2024 Month: September Day of Week: Incorrect Attention: Sustained;Focused;Selective Focused Attention: Appears intact Sustained Attention: Appears intact Selective Attention: Impaired Selective Attention Impairment: Verbal complex;Functional complex Memory: Impaired Memory Impairment: Decreased short term memory Awareness: Appears intact Problem Solving: Impaired Problem Solving Impairment: Functional complex Executive Function: Reasoning Reasoning: Appears intact Behaviors:  (eager to participate) Safety/Judgment: Appears intact Sensation Sensation Light Touch: Appears Intact Coordination Gross Motor Movements are Fluid and Coordinated: No Fine Motor Movements are Fluid and Coordinated: No Coordination and Movement Description: decreased smoothness and accuracy noted with functional mobility Motor  Motor Motor: Other (comment) Motor -  Skilled Clinical Observations: slight coordination deficits noted functionally with LUE/LLE  Trunk/Postural Assessment  Cervical Assessment Cervical Assessment: Within Functional Limits Thoracic Assessment Thoracic Assessment: Within Functional Limits Lumbar Assessment Lumbar Assessment: Within Functional Limits Postural Control Postural Control: Deficits on evaluation Righting Reactions: delayed and inadequate Protective Responses: delayed Postural Limitations: decreased  Balance Balance Balance Assessed: Yes Standardized Balance Assessment Standardized Balance Assessment: Berg Balance Test Berg Balance Test Sit to Stand: Able to stand  independently using hands Standing Unsupported: Able to stand safely 2 minutes Sitting with Back Unsupported but Feet Supported on Floor or Stool: Able to sit safely and securely 2 minutes Stand to Sit: Controls descent by using hands Transfers: Able to transfer safely, definite need of hands Standing Unsupported  with Eyes Closed: Able to stand 10 seconds with supervision Standing Ubsupported with Feet Together: Able to place feet together independently and stand for 1 minute with supervision From Standing, Reach Forward with Outstretched Arm: Can reach forward >5 cm safely (2") From Standing Position, Pick up Object from Floor: Able to pick up shoe, needs supervision From Standing Position, Turn to Look Behind Over each Shoulder: Needs supervision when turning Turn 360 Degrees: Needs close supervision or verbal cueing Standing Unsupported, Alternately Place Feet on Step/Stool: Able to complete >2 steps/needs minimal assist Standing Unsupported, One Foot in Front: Able to plae foot ahead of the other independently and hold 30 seconds Standing on One Leg: Tries to lift leg/unable to hold 3 seconds but remains standing independently Total Score: 35 Static Standing Balance Static Standing - Balance Support: Bilateral upper extremity supported Static  Standing - Level of Assistance: 5: Stand by assistance Dynamic Standing Balance Dynamic Standing - Balance Support: During functional activity;No upper extremity supported Dynamic Standing - Level of Assistance: 5: Stand by assistance (CGA) Extremity Assessment  RLE Assessment RLE Assessment: Within Functional Limits General Strength Comments: grossly 5/5 LLE Assessment LLE Assessment: Within Functional Limits General Strength Comments: grossly 5/5  Care Tool Care Tool Bed Mobility Roll left and right activity   Roll left and right assist level: Supervision/Verbal cueing    Sit to lying activity   Sit to lying assist level: Contact Guard/Touching assist    Lying to sitting on side of bed activity   Lying to sitting on side of bed assist level: the ability to move from lying on the back to sitting on the side of the bed with no back support.: Contact Guard/Touching assist     Care Tool Transfers Sit to stand transfer   Sit to stand assist level: Contact Guard/Touching assist    Chair/bed transfer   Chair/bed transfer assist level: Contact Guard/Touching assist     Toilet transfer   Assist Level: Contact Guard/Touching assist    Car transfer   Car transfer assist level: Contact Guard/Touching assist      Care Tool Locomotion Ambulation   Assist level: Contact Guard/Touching assist Assistive device: Walker-rolling Max distance: 180'  Walk 10 feet activity   Assist level: Contact Guard/Touching assist Assistive device: Walker-rolling   Walk 50 feet with 2 turns activity   Assist level: Contact Guard/Touching assist Assistive device: Walker-rolling  Walk 150 feet activity   Assist level: Contact Guard/Touching assist Assistive device: Walker-rolling  Walk 10 feet on uneven surfaces activity   Assist level: Contact Guard/Touching assist Assistive device: Walker-rolling  Stairs   Assist level: Contact Guard/Touching assist Stairs assistive device: 2 hand rails Max  number of stairs: 12  Walk up/down 1 step activity   Walk up/down 1 step (curb) assist level: Contact Guard/Touching assist Walk up/down 1 step or curb assistive device: 2 hand rails  Walk up/down 4 steps activity   Walk up/down 4 steps assist level: Contact Guard/Touching assist Walk up/down 4 steps assistive device: 2 hand rails  Walk up/down 12 steps activity   Walk up/down 12 steps assist level: Contact Guard/Touching assist Walk up/down 12 steps assistive device: 2 hand rails  Pick up small objects from floor   Pick up small object from the floor assist level: Contact Guard/Touching assist Pick up small object from the floor assistive device: none  Wheelchair Is the patient using a wheelchair?: Yes Type of Wheelchair: Manual   Wheelchair assist level: Supervision/Verbal cueing Max wheelchair distance: 150'  Wheel 50 feet with 2 turns activity   Assist Level: Supervision/Verbal cueing  Wheel 150 feet activity   Assist Level: Supervision/Verbal cueing    Refer to Care Plan for Long Term Goals  SHORT TERM GOAL WEEK 1 PT Short Term Goal 1 (Week 1): STG = LTG secondary to ELOS  Recommendations for other services: None   Skilled Therapeutic Intervention Evaluation completed (see details above and below) with education on PT POC and goals and individual treatment initiated with focus on gait training with and without device, NMR for assessment of falls risk, and education on current level of function and goals. Pt denies pain at this time and agreeable to PT. Pt completes WC mobility with supervision ~150' to main gym. Pt ambulates 180' with RW CGA verbal cues for direction, demonstrating decreased step height and length. Pt ambulates up/down ramp with RW CGA. Pt completes transfers throughout session with CGA with RW including car. Pt ambulates without RW CGA/light min assist, decreased step height and poor heel strike with initial contact. Pt completes up/down 12 steps with BHRs CGA  demonstrates decreased terminal knee extension while stepping up with initial 4 steps however improves with practice, no buckling noted. Pt completes BERG 35/56 indicating increased risk for falls, pt and pt wife educated on results of tests and evaluation as well as expected POC with pt and pt wife verbalizing understanding. Pt provided with seated rest breaks between all gait trials and exercises to promote energy conservation and quality with tasks. Pt returns to room and remains seated in [facility] with all needs within reach, cal light in place and chair alarm donned and activated at end of session.   Mobility Bed Mobility Bed Mobility: Rolling Left (roll from supine to left for getting out of bed) Rolling Left: Contact Guard/Touching assist Transfers Transfers: Sit to Stand;Stand Pivot Transfers Sit to Stand: Contact Guard/Touching assist Stand Pivot Transfers: Contact Guard/Touching assist Transfer (Assistive device): Rolling walker Locomotion  Gait Ambulation: Yes Gait Assistance: Contact Guard/Touching assist Gait Distance (Feet): 180 Feet Assistive device: Rolling walker Gait Gait: Yes Gait Pattern: Impaired Gait Pattern: Decreased step length - left;Decreased step length - right;Step-through pattern (decreased step height bilaterally) Gait velocity: decreased Stairs / Additional Locomotion Stairs: Yes Stairs Assistance: Contact Guard/Touching assist Stair Management Technique: Two rails Number of Stairs: 12 Height of Stairs: 6 Ramp: Doctor, hospital Mobility: Yes Wheelchair Assistance: Doctor, general practice: Both upper extremities Distance: 150'   Discharge Criteria: Patient will be discharged from PT if patient refuses treatment 3 consecutive times without medical reason, if treatment goals not met, if there is a change in medical status, if patient makes no progress towards goals or if patient is  discharged from hospital.  The above assessment, treatment plan, treatment alternatives and goals were discussed and mutually agreed upon: by patient  Edwin Cap PT, DPT 05/16/2023, 3:51 PM

## 2023-05-16 NOTE — Plan of Care (Signed)
  Problem: RH Balance Goal: LTG Patient will maintain dynamic standing balance (PT) Description: LTG:  Patient will maintain dynamic standing balance with assistance during mobility activities (PT) Flowsheets (Taken 05/16/2023 1558) LTG: Pt will maintain dynamic standing balance during mobility activities with:: Supervision/Verbal cueing   Problem: Sit to Stand Goal: LTG:  Patient will perform sit to stand with assistance level (PT) Description: LTG:  Patient will perform sit to stand with assistance level (PT) Flowsheets (Taken 05/16/2023 1558) LTG: PT will perform sit to stand in preparation for functional mobility with assistance level: Supervision/Verbal cueing   Problem: RH Bed to Chair Transfers Goal: LTG Patient will perform bed/chair transfers w/assist (PT) Description: LTG: Patient will perform bed to chair transfers with assistance (PT). Flowsheets (Taken 05/16/2023 1558) LTG: Pt will perform Bed to Chair Transfers with assistance level: Supervision/Verbal cueing   Problem: RH Car Transfers Goal: LTG Patient will perform car transfers with assist (PT) Description: LTG: Patient will perform car transfers with assistance (PT). Flowsheets (Taken 05/16/2023 1558) LTG: Pt will perform car transfers with assist:: Supervision/Verbal cueing   Problem: RH Ambulation Goal: LTG Patient will ambulate in controlled environment (PT) Description: LTG: Patient will ambulate in a controlled environment, # of feet with assistance (PT). Flowsheets (Taken 05/16/2023 1558) LTG: Pt will ambulate in controlled environ  assist needed:: Supervision/Verbal cueing LTG: Ambulation distance in controlled environment: 150' Note: With LRAD Goal: LTG Patient will ambulate in home environment (PT) Description: LTG: Patient will ambulate in home environment, # of feet with assistance (PT). Flowsheets (Taken 05/16/2023 1558) LTG: Pt will ambulate in home environ  assist needed:: Supervision/Verbal cueing LTG:  Ambulation distance in home environment: 17' Note: With LRAD Goal: LTG Patient will ambulate in community environment (PT) Description: LTG: Patient will ambulate in community environment, # of feet with assistance (PT). Flowsheets (Taken 05/16/2023 1558) LTG: Pt will ambulate in community environ  assist needed:: Supervision/Verbal cueing LTG: Ambulation distance in community environment: 300' Note: With LRAD   Problem: RH Stairs Goal: LTG Patient will ambulate up and down stairs w/assist (PT) Description: LTG: Patient will ambulate up and down # of stairs with assistance (PT) Flowsheets (Taken 05/16/2023 1558) LTG: Pt will ambulate up/down stairs assist needed:: Contact Guard/Touching assist LTG: Pt will  ambulate up and down number of stairs: 3 Note: Without handrails to simulate home environment   Problem: RH Bed Mobility Goal: LTG Patient will perform bed mobility with assist (PT) Description: LTG: Patient will perform bed mobility with assistance, with/without cues (PT). Flowsheets (Taken 05/16/2023 1558) LTG: Pt will perform bed mobility with assistance level of: Independent

## 2023-05-17 DIAGNOSIS — D496 Neoplasm of unspecified behavior of brain: Secondary | ICD-10-CM | POA: Diagnosis not present

## 2023-05-17 DIAGNOSIS — R300 Dysuria: Secondary | ICD-10-CM

## 2023-05-17 DIAGNOSIS — I10 Essential (primary) hypertension: Secondary | ICD-10-CM | POA: Diagnosis not present

## 2023-05-17 DIAGNOSIS — E119 Type 2 diabetes mellitus without complications: Secondary | ICD-10-CM | POA: Diagnosis not present

## 2023-05-17 DIAGNOSIS — E871 Hypo-osmolality and hyponatremia: Secondary | ICD-10-CM | POA: Diagnosis not present

## 2023-05-17 LAB — GLUCOSE, CAPILLARY
Glucose-Capillary: 203 mg/dL — ABNORMAL HIGH (ref 70–99)
Glucose-Capillary: 264 mg/dL — ABNORMAL HIGH (ref 70–99)
Glucose-Capillary: 233 mg/dL — ABNORMAL HIGH (ref 70–99)
Glucose-Capillary: 368 mg/dL — ABNORMAL HIGH (ref 70–99)

## 2023-05-17 LAB — BASIC METABOLIC PANEL
Anion gap: 11 (ref 5–15)
Anion gap: 11 (ref 5–15)
BUN: 29 mg/dL — ABNORMAL HIGH (ref 8–23)
BUN: 31 mg/dL — ABNORMAL HIGH (ref 8–23)
CO2: 24 mmol/L (ref 22–32)
CO2: 21 mmol/L — ABNORMAL LOW (ref 22–32)
Calcium: 8.3 mg/dL — ABNORMAL LOW (ref 8.9–10.3)
Calcium: 7.8 mg/dL — ABNORMAL LOW (ref 8.9–10.3)
Chloride: 97 mmol/L — ABNORMAL LOW (ref 98–111)
Chloride: 98 mmol/L (ref 98–111)
Creatinine, Ser: 0.96 mg/dL (ref 0.61–1.24)
Creatinine, Ser: 1.05 mg/dL (ref 0.61–1.24)
GFR, Estimated: >60 mL/min (ref >60)
GFR, Estimated: >60 mL/min (ref >60)
Glucose, Bld: 272 mg/dL — ABNORMAL HIGH (ref 70–99)
Glucose, Bld: 282 mg/dL — ABNORMAL HIGH (ref 70–99)
Potassium: 4.3 mmol/L (ref 3.5–5.1)
Potassium: 4.7 mmol/L (ref 3.5–5.1)
Sodium: 132 mmol/L — ABNORMAL LOW (ref 135–145)
Sodium: 130 mmol/L — ABNORMAL LOW (ref 135–145)

## 2023-05-17 MED ORDER — METFORMIN HCL 500 MG PO TABS
500.0000 mg | ORAL_TABLET | Freq: Two times a day (BID) | ORAL | Status: DC
  Administered 2023-05-17 – 2023-05-23 (×12): 500 mg via ORAL
  Filled 2023-05-17 (×12): qty 1

## 2023-05-17 NOTE — Progress Notes (Signed)
PROGRESS NOTE   Subjective/Complaints: He has some burning during urination today. No additional concerns this AM. [birth year] today.    ROS: Patient denies fever, rash, sore throat, blurred vision, dizziness, nausea, vomiting, diarrhea, cough, shortness of breath or chest pain, joint or back/neck pain, headache, or mood change. +dysuria   Objective:   No results found. Recent Labs    05/15/23 0537 05/16/23 0444  WBC 25.4* 27.2*  HGB 11.5* 11.5*  HCT 35.0* 34.3*  PLT 161 156   Recent Labs    05/16/23 1758 05/17/23 0836  NA 131* 132*  K 4.7 4.3  CL 95* 97*  CO2 21* 24  GLUCOSE 305* 272*  BUN 33* 29*  CREATININE 1.09 0.96  CALCIUM 8.9 8.3*    Intake/Output Summary (Last 24 hours) at 05/17/2023 1216 Last data filed at 05/17/2023 1112 Gross per 24 hour  Intake 960 ml  Output 1750 ml  Net -790 ml        Physical Exam: Vital Signs Blood pressure 125/77, pulse 61, temperature 97.7 F (36.5 C), resp. rate 18, weight 86.1 kg, SpO2 98%.  General:  No apparent distress, sitting in chair HEENT: Head is normocephalic, PERRLA, EOMI,dressing in place over craniotomy site with a small amount of dried blood Heart: Reg rate and rhythm.  Chest: CTA bilaterally without wheezes, rales, or rhonchi; no distress Abdomen: Soft, non-tender, non-distended, bowel sounds positive. Extremities: No clubbing, cyanosis, or edema. Pulses are 2+ Psych: Pleasant, appropriate Skin: Clean and intact without signs of breakdown Bruising noted left upper extremity and right neck Neuro:  Alert and oriented x 4, follows commands, cranial nerves II through XII grossly intact Moving all extremities to gravity and resistance Sensory exam normal for light touch and pain in all 4 limbs. No limb ataxia or cerebellar signs. No abnormal tone appreciated.    Musculoskeletal: No joint swelling or tenderness No hypertonia noted     Assessment/Plan: 1. Functional deficits which require 3+ hours per day of interdisciplinary therapy in a comprehensive inpatient rehab setting. Physiatrist is providing close team supervision and 24 hour management of active medical problems listed below. Physiatrist and rehab team continue to assess barriers to discharge/monitor patient progress toward functional and medical goals  Care Tool:  Bathing    Body parts bathed by patient: Right arm, Left arm, Chest, Abdomen, Front perineal area, Buttocks, Right upper leg, Left upper leg, Right lower leg, Left lower leg, Face         Bathing assist Assist Level: Independent with assistive device Assistive Device Comment: RW with MinA for LB task performance   Upper Body Dressing/Undressing Upper body dressing   What is the patient wearing?: Pull over shirt    Upper body assist Assist Level: Set up assist    Lower Body Dressing/Undressing Lower body dressing      What is the patient wearing?: Underwear/pull up, Pants     Lower body assist Assist for lower body dressing: Minimal Assistance - Patient > 75%     Toileting Toileting    Toileting assist Assist for toileting: Independent with assistive device     Transfers Chair/bed transfer  Transfers assist  Chair/bed transfer assist level: Contact Guard/Touching assist     Locomotion Ambulation   Ambulation assist      Assist level: Contact Guard/Touching assist Assistive device: Walker-rolling Max distance: 180'   Walk 10 feet activity   Assist     Assist level: Contact Guard/Touching assist Assistive device: Walker-rolling   Walk 50 feet activity   Assist    Assist level: Contact Guard/Touching assist Assistive device: Walker-rolling    Walk 150 feet activity   Assist    Assist level: Contact Guard/Touching assist Assistive device: Walker-rolling    Walk 10 feet on uneven surface  activity   Assist     Assist level: Contact  Guard/Touching assist Assistive device: Walker-rolling   Wheelchair     Assist Is the patient using a wheelchair?: Yes Type of Wheelchair: Manual (distance)    Wheelchair assist level: Supervision/Verbal cueing Max wheelchair distance: 150'    Wheelchair 50 feet with 2 turns activity    Assist        Assist Level: Supervision/Verbal cueing   Wheelchair 150 feet activity     Assist      Assist Level: Supervision/Verbal cueing   Blood pressure 125/77, pulse 61, temperature 97.7 F (36.5 C), resp. rate 18, weight 86.1 kg, SpO2 98%.  Medical Problem List and Plan: 1. 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 Jake Samples on 05/12/2023             -patient may shower, cover surgical incisions             -ELOS/Goals: 7-10 days, Mod I PT/OT/SLP             -Continue CIR   2.  Antithrombotics: -DVT/anticoagulation:  Pharmaceutical: Heparin started 9/05             -antiplatelet therapy: none   3. Pain Management: Tylenol, Norco as needed   4. Mood/Behavior/Sleep: LCSW to evaluate and provide emotional support             -continue melatonin 5 mg q HS             -antipsychotic agents: n/a             -Consider trazodone if insomnia does not improve with melatonin   5. Neuropsych/cognition: This patient is capable of making decisions on his own behalf.   6. Skin/Wound Care: Routine skin care checks             -monitor surgical incision   7. Fluids/Electrolytes/Nutrition: Routine Is and Os and follow-up chemistries             -adjust to carb modified diet             -monitor PO intake (no current documentation)   8: Hypertension: monitor TID and prn, has been controlled overall             -continue carvedilol 12.5 mg Bid             -continue clonidine 0.2 mg BID             -continue lisinopril 20 mg BID  -9/7 change lisinopril to 10 mg daily due to soft BP  -9/8 a  little labile, continue to monitor trend     05/17/2023    5:05 AM 05/16/2023    7:38 PM 05/16/2023    1:17 PM  Vitals with BMI  Systolic 125 159 91  Diastolic 77 78 52  Pulse 61 67 62      9: Hyperlipidemia: continue Lipitor 10 mg   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 Jake Samples, follow-up with Dr. Barbaraann Cao   11: DM-2: CBGs QID; A1c = 6.9%, steroid induced hyperglycemia (home meds include Januvia 100 mg daily, metformin 1000 mg twice daily, Actos 30 mg daily>>not restarted)             -continue SSI             -continue Semglee 16 units daily  -9/7 increase Semglee to 18 units daily, may need to wean down as steroids are reduced  -9/8 Restart metformin at 500mg  BID  CBG (last 3)  Recent Labs    05/16/23 2107 05/17/23 0609 05/17/23 1148  GLUCAP 295* 203* 264*      12: GI prophylaxis: continue Protonix BID while on steroids   13: Hyponatremia: Last sodium 130 9/6, follow-up BMP -NA 126, asymptomatic start salt tabs, fluid restriction, recheck labs later today, check urine osmolality and sodium -Decrease lisinopril dose to 10 mg daily -9/8 improved  to 132, continue to monitor     Latest Ref Rng & Units 05/17/2023    8:36 AM 05/16/2023    5:58 PM 05/16/2023    4:44 AM  BMP  Glucose 70 - 99 mg/dL 161  096  045   BUN 8 - 23 mg/dL 29  33  21   Creatinine 0.61 - 1.24 mg/dL 4.09  8.11  9.14   Sodium 135 - 145 mmol/L 132  131  126   Potassium 3.5 - 5.1 mmol/L 4.3  4.7  4.1   Chloride 98 - 111 mmol/L 97  95  97   CO2 22 - 32 mmol/L 24  21  20    Calcium 8.9 - 10.3 mg/dL 8.3  8.9  7.8      14: Mild anemia: Hgb 11.5 9/6, follow-up CBC  -9/7 Hgb stable 11.5   14: Leukocytosis: WBC 25.4 on 9/6 , likely steroid induced; currently afebrile>> monitor             -9/7 WBC continues to be elevated, monitor  for signs of infection   15.  Acute metabolic encephalopathy: TSH/B12 stable.  Improving   16. Dysuria  -UA ordered  LOS: 2 days A FACE TO FACE EVALUATION WAS PERFORMED  Fanny Dance 05/17/2023, 12:16 PM

## 2023-05-18 DIAGNOSIS — I10 Essential (primary) hypertension: Secondary | ICD-10-CM | POA: Diagnosis not present

## 2023-05-18 DIAGNOSIS — G479 Sleep disorder, unspecified: Secondary | ICD-10-CM

## 2023-05-18 DIAGNOSIS — A499 Bacterial infection, unspecified: Secondary | ICD-10-CM

## 2023-05-18 DIAGNOSIS — N390 Urinary tract infection, site not specified: Secondary | ICD-10-CM

## 2023-05-18 DIAGNOSIS — D496 Neoplasm of unspecified behavior of brain: Secondary | ICD-10-CM | POA: Diagnosis not present

## 2023-05-18 LAB — CBC
HCT: 36.7 % — ABNORMAL LOW (ref 39.0–52.0)
Hemoglobin: 11.9 g/dL — ABNORMAL LOW (ref 13.0–17.0)
MCH: 29.5 pg (ref 26.0–34.0)
MCHC: 32.4 g/dL (ref 30.0–36.0)
MCV: 90.8 fL (ref 80.0–100.0)
Platelets: 177 10*3/uL (ref 150–400)
RBC: 4.04 MIL/uL — ABNORMAL LOW (ref 4.22–5.81)
RDW: 14.4 % (ref 11.5–15.5)
WBC: 24.5 10*3/uL — ABNORMAL HIGH (ref 4.0–10.5)
nRBC: 0 % (ref 0.0–0.2)

## 2023-05-18 LAB — URINALYSIS, W/ REFLEX TO CULTURE (INFECTION SUSPECTED)
Bilirubin Urine: NEGATIVE
Glucose, UA: NEGATIVE mg/dL
Ketones, ur: NEGATIVE mg/dL
Leukocytes,Ua: NEGATIVE
Nitrite: NEGATIVE
Protein, ur: 100 mg/dL — AB
Specific Gravity, Urine: 1.019 (ref 1.005–1.030)
pH: 5 (ref 5.0–8.0)

## 2023-05-18 LAB — BASIC METABOLIC PANEL
Anion gap: 7 (ref 5–15)
BUN: 26 mg/dL — ABNORMAL HIGH (ref 8–23)
CO2: 26 mmol/L (ref 22–32)
Calcium: 8 mg/dL — ABNORMAL LOW (ref 8.9–10.3)
Chloride: 101 mmol/L (ref 98–111)
Creatinine, Ser: 0.88 mg/dL (ref 0.61–1.24)
GFR, Estimated: >60 mL/min (ref >60)
Glucose, Bld: 123 mg/dL — ABNORMAL HIGH (ref 70–99)
Potassium: 3.9 mmol/L (ref 3.5–5.1)
Sodium: 134 mmol/L — ABNORMAL LOW (ref 135–145)

## 2023-05-18 LAB — GLUCOSE, CAPILLARY
Glucose-Capillary: 149 mg/dL — ABNORMAL HIGH (ref 70–99)
Glucose-Capillary: 191 mg/dL — ABNORMAL HIGH (ref 70–99)
Glucose-Capillary: 208 mg/dL — ABNORMAL HIGH (ref 70–99)
Glucose-Capillary: 191 mg/dL — ABNORMAL HIGH (ref 70–99)

## 2023-05-18 MED ORDER — SODIUM CHLORIDE 0.9 % NICU IV INFUSION SIMPLE
INJECTION | INTRAVENOUS | Status: DC
  Filled 2023-05-18 (×3): qty 500

## 2023-05-18 MED ORDER — MELATONIN 5 MG PO TABS
10.0000 mg | ORAL_TABLET | Freq: Every day | ORAL | Status: DC
  Administered 2023-05-18 – 2023-05-22 (×5): 10 mg via ORAL
  Filled 2023-05-18 (×5): qty 2

## 2023-05-18 NOTE — Progress Notes (Signed)
Inpatient Rehabilitation  Patient information reviewed and entered into eRehab system by Melissa M. Bowie, M.A., CCC/SLP, PPS Coordinator.  Information including medical coding, functional ability and quality indicators will be reviewed and updated through discharge.    

## 2023-05-18 NOTE — Progress Notes (Signed)
This nurse arrived to patient room. Educated patient on need for PIV and fluids ordered. Patient declined PIV placement at this time. Requesting to speak with MD first. Nurse, Toni Amend RN, notified. Tomasita Morrow, RN VAST

## 2023-05-18 NOTE — Progress Notes (Addendum)
Patient ID: LAYMAN ALTIER, male   DOB: [birth year], 77 y.o.   MRN: [id number]  SW spoke with pt wife to introduce self, explain role, discuss discharge process, and inform on ELOS. She confirms she will be primary caregiver. Wife states she works 7:30am-5pm and pt will need to be intermittent supervision to Mod I at discharge.  Fam edu scheduled for Wed 8am-12pm. Wife is working towards getting handrails installed at front entrance.   Cecile Sheerer, MSW, LCSWA Office: [phone] Cell: [phone] Fax: [phone]

## 2023-05-18 NOTE — Progress Notes (Signed)
Inpatient Rehabilitation Care Coordinator Assessment and Plan Patient Details  Name: Alex Church MRN: [id number] Date of Birth: December 26, 1945  Today's Date: 05/18/2023  Hospital Problems: Principal Problem:   Glial neoplasm of brain [facility])  Past Medical History:  Past Medical History:  Diagnosis Date   B12 deficiency    monthly shots   Diabetes mellitus    Glaucoma suspect    Hyperlipemia    Hypertension    Past Surgical History:  Past Surgical History:  Procedure Laterality Date   APPLICATION OF CRANIAL NAVIGATION Right 05/12/2023   Procedure: APPLICATION OF CRANIAL NAVIGATION;  Surgeon: Bethann Goo, DO;  Location: [facility] OR;  Service: Neurosurgery;  Laterality: Right;   CRANIOTOMY Right 05/12/2023   Procedure: RIGHT STERIOSTACTIC FRONTAL CRANIOTOMY FOR TUMOR RESECTION;  Surgeon: Bethann Goo, DO;  Location: [facility] OR;  Service: Neurosurgery;  Laterality: Right;   NO PAST SURGERIES     Social History:  reports that he has never smoked. He has never used smokeless tobacco. He reports that he does not drink alcohol and does not use drugs.  Family / Support Systems Marital Status: Married How Long?: 37 years Patient Roles: Spouse Spouse/Significant Other: [name] (wife) Children: No chidren Other Supports: None reported Anticipated Caregiver: Wife Ability/Limitations of Caregiver: Wife works 730am-5pm so pt will need to be intermittent supervision to Mod I at discharge. She is also unable to physically lift him as well. Pt is working torwards seeing if family can be with him intermittently throughout the day. 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 Education: GED Health Literacy - How often do you need to have someone help you when you read instructions, pamphlets, or other written  material from your doctor or pharmacy?: Never Writes: Yes Employment Status: Retired Date Retired/Disabled/Unemployed: 2014 Age Retired: 15 Marine scientist Issues: Denies Guardian/Conservator: pt wife is pt HCPOA. On file under media tab in Epic.   Abuse/Neglect Abuse/Neglect Assessment Can Be Completed: Yes Physical Abuse: Denies Verbal Abuse: Denies Sexual Abuse: Denies Exploitation of patient/patient's resources: Denies Self-Neglect: Denies  Patient response to: Social Isolation - How often do you feel lonely or isolated from those around you?: Never  Emotional Status Pt's affect, behavior and adjustment status: Pt in good spirits at time of visit Recent Psychosocial Issues: Denies Psychiatric History: Denies Substance Abuse History: Denies  Patient / Family Perceptions, Expectations & Goals Pt/Family understanding of illness & functional limitations: Pt and wife have a general understanding of pt care needs Premorbid pt/family roles/activities: Independnet Anticipated changes in roles/activities/participation: Assistance with ADLs/IADLs Pt/family expectations/goals: pt goal is to work on getting some strength back so he can be independent, and be able to navigate stairs since bedrooms are on second floor (12 steps).  Community Resources [employer]: None Premorbid Home Care/DME Agencies: None Transportation available at discharge: Wife Is the patient able to respond to transportation needs?: Yes In the past 12 months, has lack of transportation kept you from medical appointments or from getting medications?: No In the past 12 months, has lack of transportation kept you from meetings, work, or from getting things needed for daily living?: No Resource referrals recommended: Neuropsychology  Discharge Planning Living Arrangements: Spouse/significant other Support Systems: Spouse/significant other Type of Residence: Private residence Community education officer Resources:  Media planner (specify) (BCBS Medicare) Surveyor, quantity Resources: Tree surgeon, Other (Comment) (retirement) Surveyor, quantity Screen  Referred: No Living Expenses: Own Money Management: Spouse Does the patient have any problems obtaining your medications?: No Home Management: Pt prepared all meals, and both helped with homecare needs. Patient/Family Preliminary Plans: TBD Care Coordinator Barriers to Discharge: Decreased caregiver support, Lack of/limited family support, Insurance for SNF coverage Care Coordinator Anticipated Follow Up Needs: HH/OP Expected length of stay: 7-10 days  Clinical Impression SW met with pt and pt wife in room to introduce self, explain role, and discuss discharge process. Pt is not a Cytogeneticist. Wife is HCPOA. No DME. Wife reports she will return work next Monday and would like if pt were to d/c on Friday or Saturday so she can get him settled. SW provided sitter list for her review so she can work on additional supports if pt family is not able to assist as reported. SW updated medical team.   Gretchen Short 05/18/2023, 12:33 PM

## 2023-05-18 NOTE — Progress Notes (Signed)
PROGRESS NOTE   Subjective/Complaints: Not sleeping well. Having to urinate frequently and still c/o burning. Also, just can't settle down enough to rest presently. Took melatonin last night  ROS: Patient denies fever, rash, sore throat, blurred vision, dizziness, nausea, vomiting, diarrhea, cough, shortness of breath or chest pain, joint or back/neck pain, headache, or mood change.   Objective:   No results found. Recent Labs    05/16/23 0444 05/18/23 0701  WBC 27.2* 24.5*  HGB 11.5* 11.9*  HCT 34.3* 36.7*  PLT 156 177   Recent Labs    05/17/23 1818 05/18/23 0701  NA 130* 134*  K 4.7 3.9  CL 98 101  CO2 21* 26  GLUCOSE 282* 123*  BUN 31* 26*  CREATININE 1.05 0.88  CALCIUM 7.8* 8.0*    Intake/Output Summary (Last 24 hours) at 05/18/2023 0955 Last data filed at 05/18/2023 0723 Gross per 24 hour  Intake 840 ml  Output 2600 ml  Net -1760 ml        Physical Exam: Vital Signs Blood pressure (!) 119/53, pulse (!) 57, temperature 98 F (36.7 C), temperature source Oral, resp. rate 18, weight 86.1 kg, SpO2 97%.  Constitutional: No distress . Vital signs reviewed. HEENT: crani incision intact with scab, foam dressing.  EOMI, oral membranes moist Neck: supple Cardiovascular: RRR without murmur. No JVD    Respiratory/Chest: CTA Bilaterally without wheezes or rales. Normal effort    GI/Abdomen: BS +, non-tender, non-distended Ext: no clubbing, cyanosis, or edema Psych: pleasant and cooperative  Skin: Clean and intact   Bruising noted left upper extremity and right neck Neuro:  Alert and oriented x 4, reasonable insight and awareness.  follows commands, cranial nerves II through XII grossly intact Moving all extremities to gravity and resistance Sensory exam normal for light touch and pain in all 4 limbs. No limb ataxia or cerebellar signs. No abnormal tone appreciated.    Musculoskeletal: No joint swelling or  tenderness No hypertonia noted    Assessment/Plan: 1. Functional deficits which require 3+ hours per day of interdisciplinary therapy in a comprehensive inpatient rehab setting. Physiatrist is providing close team supervision and 24 hour management of active medical problems listed below. Physiatrist and rehab team continue to assess barriers to discharge/monitor patient progress toward functional and medical goals  Care Tool:  Bathing    Body parts bathed by patient: Right arm, Left arm, Chest, Abdomen, Front perineal area, Buttocks, Right upper leg, Left upper leg, Right lower leg, Left lower leg, Face         Bathing assist Assist Level: Independent with assistive device Assistive Device Comment: RW with MinA for LB task performance   Upper Body Dressing/Undressing Upper body dressing   What is the patient wearing?: Pull over shirt    Upper body assist Assist Level: Set up assist    Lower Body Dressing/Undressing Lower body dressing      What is the patient wearing?: Underwear/pull up, Pants     Lower body assist Assist for lower body dressing: Minimal Assistance - Patient > 75%     Toileting Toileting    Toileting assist Assist for toileting: Independent with assistive device  Transfers Chair/bed transfer  Transfers assist     Chair/bed transfer assist level: Contact Guard/Touching assist     Locomotion Ambulation   Ambulation assist      Assist level: Contact Guard/Touching assist Assistive device: Walker-rolling Max distance: 180'   Walk 10 feet activity   Assist     Assist level: Contact Guard/Touching assist Assistive device: Walker-rolling   Walk 50 feet activity   Assist    Assist level: Contact Guard/Touching assist Assistive device: Walker-rolling    Walk 150 feet activity   Assist    Assist level: Contact Guard/Touching assist Assistive device: Walker-rolling    Walk 10 feet on uneven surface   activity   Assist     Assist level: Contact Guard/Touching assist Assistive device: Walker-rolling   Wheelchair     Assist Is the patient using a wheelchair?: Yes Type of Wheelchair: Manual (distance)    Wheelchair assist level: Supervision/Verbal cueing Max wheelchair distance: 150'    Wheelchair 50 feet with 2 turns activity    Assist        Assist Level: Supervision/Verbal cueing   Wheelchair 150 feet activity     Assist      Assist Level: Supervision/Verbal cueing   Blood pressure (!) 119/53, pulse (!) 57, temperature 98 F (36.7 C), temperature source Oral, resp. rate 18, weight 86.1 kg, SpO2 97%.  Medical Problem List and Plan: 1. 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 Jake Samples on 05/12/2023             -patient may shower, cover surgical incisions             -ELOS/Goals: 7-10 days, Mod I PT/OT/SLP             -Continue CIR therapies including PT, OT, and SLP    2.  Antithrombotics: -DVT/anticoagulation:  Pharmaceutical: Heparin started 9/05             -antiplatelet therapy: none   3. Pain Management: Tylenol, Norco as needed   4. Mood/Behavior/Sleep: LCSW to evaluate and provide emotional support             -will increase melatonin to 10mg  q HS  -rx likely UTI             -antipsychotic agents: n/a              5. Neuropsych/cognition: This patient is capable of making decisions on his own behalf.   6. Skin/Wound Care: Routine skin care checks             -monitor surgical incision   7. Fluids/Electrolytes/Nutrition: Routine Is and Os and follow-up chemistries             -adjust to carb modified diet             -monitor PO intake (no current documentation)   8: Hypertension: monitor TID and prn, has been controlled overall             -continue carvedilol 12.5 mg Bid             -continue clonidine 0.2 mg BID             -continue  lisinopril 20 mg BID  -9/7 change lisinopril to 10 mg daily due to soft BP  -9/9 controlled, BP still a little soft     05/18/2023    5:00 AM 05/17/2023    6:08  PM 05/17/2023    1:23 PM  Vitals with BMI  Systolic 119 123 161  Diastolic 53 55 57  Pulse 57 69 61      9: Hyperlipidemia: continue Lipitor 10 mg   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 Jake Samples, follow-up with Dr. Barbaraann Cao   11: DM-2: CBGs QID; A1c = 6.9%, steroid induced hyperglycemia (home meds include Januvia 100 mg daily, metformin 1000 mg twice daily, Actos 30 mg daily>>not restarted)             -continue SSI             -continue Semglee 16 units daily  -9/7 increase Semglee to 18 units daily, may need to wean down as steroids are reduced  -9/8 Restart metformin at 500mg  BID  -9/9 observe today, cbg already improved this morning CBG (last 3)  Recent Labs    05/17/23 1731 05/17/23 2030 05/18/23 0602  GLUCAP 233* 368* 149*      12: GI prophylaxis: continue Protonix BID while on steroids   13: Hyponatremia: Last sodium 130 9/6, follow-up BMP -NA 126, asymptomatic start salt tabs, fluid restriction, recheck labs later today, check urine osmolality and sodium -Decrease lisinopril dose to 10 mg daily -9/9 improved  to 134, continue to monitor     Latest Ref Rng & Units 05/18/2023    7:01 AM 05/17/2023    6:18 PM 05/17/2023    8:36 AM  BMP  Glucose 70 - 99 mg/dL 096  045  409   BUN 8 - 23 mg/dL 26  31  29    Creatinine 0.61 - 1.24 mg/dL 8.11  9.14  7.82   Sodium 135 - 145 mmol/L 134  130  132   Potassium 3.5 - 5.1 mmol/L 3.9  4.7  4.3   Chloride 98 - 111 mmol/L 101  98  97   CO2 22 - 32 mmol/L 26  21  24    Calcium 8.9 - 10.3 mg/dL 8.0  7.8  8.3      14: Mild anemia: Hgb 11.5 9/6, follow-up CBC  -9/7 Hgb stable 11.5   14:  Leukocytosis:               -9/9 WBC continues to be elevated 24.5 , monitor for signs of infection. Likely steroids    -ua,ucx today 15.  Acute metabolic encephalopathy: TSH/B12 stable.  Improving   16. Dysuria  -UAUCX   pending  LOS: 3 days A FACE TO FACE EVALUATION WAS PERFORMED  Ranelle Oyster 05/18/2023, 9:55 AM

## 2023-05-18 NOTE — Progress Notes (Signed)
Discussed need for urine sample prior to starting antibiotics with patient and his wife.  Patient currently OOB in chair in NAD. He is alert and oriented. Wife and family member at bedside. I did see him earlier today in the gym and we discussed his incision and timing of staple removal. Wife states urine was bloody and had foul odor.  Patient adamantly refuses in and out catheterization. He had a very small volume void this afternoon that was too small for UA. Currently on FR due to hyponatremia which is improved. Discussed with attendant RN and with Dr. Riley Kill. Will start NS at 50 cc/hr and await spontaneous void for UA. Monitor for fever or AMS change, etc. Check BMP in am.

## 2023-05-18 NOTE — Progress Notes (Signed)
Physical Therapy Session Note  Patient Details  Name: Alex Church MRN: [id number] Date of Birth: [birth year]  Today's Date: 05/18/2023 PT Individual Time: 1302-1416 PT Individual Time Calculation (min): 74 min   Short Term Goals: Week 1:  PT Short Term Goal 1 (Week 1): STG = LTG secondary to ELOS  Skilled Therapeutic Interventions/Progress Updates:    Pt presents in room in [facility], agreeable to PT, denies pain. Session focused on gait training, participation with self care tasks, and therapeutic exercise to promote BLE strengthening, dynamic standing balance, and tolerance to upright.  Pt transported dependently to main gym via [facility] for time management. Pt ambulates with RW CGA/supervision 180' demonstrates good postural stability, good consistency with step length and height. Pt ambulates 90' without device with CGA/min assist x1 LOB corrects with min assist with good stepping strategy noted just delayed reaction. Pt requesting urgently to return to room to void urine, pt incontinent during WC trip back to room. Pt transported to personal bathroom to complete stand and finish urinating in urinal. Pt doffs/dons pants and underwear in standing with cues for hygiene while standing, sits to thread over bilateral feet.  Pt completes standing therex without UE support to promote BLE strengthening, activity tolerance, and muscle fiber recruitment needed for functional transfers including: - Marching x20 alternating BLE - Heel raise x20 - Forward/lateral/backward x10 each BLE - Step taps 6" step x20 alternating BLE - Step ups 6" step BLE x5 BUE support, x5 with unilateral UE support  Pt provided with seated rest breaks between all gait trials and exercises to promote energy conservation and quality with tasks. Pt educated on picturing environment when transferring to facilitate improved safety with pt demonstrating understanding and reaches for [facility] prior to sitting without prompting.  Pt ambulates  forward/backward 3x6' without device CGA, decreased step height and length, good sequencing. Pt completes side stepping 3x6' bilaterally with CGA. Completed both activities for multidirectional stepping stability with gait.  Pt returns to room and remains seated in [facility] with all needs within reach, cal light in place and chair alarm donned and activated at end of session.    Therapy Documentation Precautions:  Precautions Precautions: Fall Precaution Comments: fall risk secondary to funcitonal balance Restrictions Weight Bearing Restrictions: No    Therapy/Group: Individual Therapy  Edwin Cap PT, DPT 05/18/2023, 2:19 PM

## 2023-05-18 NOTE — Progress Notes (Signed)
Speech Language Pathology Daily Session Note  Patient Details  Name: Alex Church MRN: [id number] Date of Birth: 1946/04/19  Today's Date: 05/18/2023 SLP Individual Time: 1000-1100 SLP Individual Time Calculation (min): 60 min  Short Term Goals: Week 1: SLP Short Term Goal 1 (Week 1): STGs=LTGs due ELOS  Skilled Therapeutic Interventions:   Pt and family greeted at bedside. He was awake/alert in his wc and agreeable to tx tasks targeting cognition. He completed a written time management task w/ s visual/verbal cues for attention to detail. Calculations were consistently accurate. SLP also facilitated written directions task and pt benefited from s visual/verbal cues again for selective and alternating attention throughout task given conversational exchanges provided by SLP as a distraction. Written tasks utilized as distraction between recall times during structured recall task w/ 4 words. Initially, he was only able to recall 1/4 words after ~10 min delay. After initial trial, SLP introduced WRAP memory strategies and pt utilized written cues to assist w/ recall. Utilizing WRAP memory strategies, he was able to recall 4/4 words during remaining 2 trials (10+ min delay) w/ only s verbal cues to utilize his written cues. At the end of tx tasks, he was left in his chair with the alarm set. Call light within reach and pt's family available to assist as needed. Recommend cont ST per POC.   Pain  No pain reported  Therapy/Group: Individual Therapy  [name] 05/18/2023, 12:41 PM

## 2023-05-18 NOTE — Progress Notes (Signed)
Occupational Therapy Session Note  Patient Details  Name: Alex Church MRN: [id number] Date of Birth: 1946/01/01  Today's Date: 05/18/2023 OT Individual Time: 6440-3474 OT Individual Time Calculation (min): 75 min    Short Term Goals: Week 1:  OT Short Term Goal 1 (Week 1): The pt will improve UB strength to 4/5MMT at 95% safe OT Short Term Goal 2 (Week 1): The pt will complete LB bathing and dressing with ModI  and 95% safe using AE as needed OT Short Term Goal 3 (Week 1): The pt will tolerate > 30 minutes of skilled OT with minimal rest breaks at 95% safe OT Short Term Goal 4 (Week 1): The pt will complete a simple meal with ModI at 95% safe during performance with rest breaks as needed. OT Short Term Goal 5 (Week 1): The pt will demonstrate good funcitonal balance during functional task performance at 95% safe .  Skilled Therapeutic Interventions/Progress Updates:    Pt greeted semi-reclined in bed awake and agreeable to OT treatment session. Pt somewhat tangential, but able to be redirected. Pt completed bed mobility with supervision and donned non skid socks w/ supervision. Pt reported that prior to surgery, he was losing his balance when he would put his socks on, no LOB today. Pt ambulated to the bathroom with RW and CGA with cues for hand placement on RW. Pt stood at the toilet to void, then ambulated to shower seat. Bathing completed sit<>stand from shower seat with overall CGA for balance when standing to wash buttocks. CGA for dressing tasks when standing to pull up pants. Pt donned socks and shoes using figure 4 position with supervision. Pt ambulated to therapy gym w/ RW and CGA. UB and LB strengthening with NuStep on level 5 for 10 minutes. Pt ambulated back to room in similar fashion and left seated in wc with alarm on, call bell in reach, and family present.   Therapy Documentation Precautions:  Precautions Precautions: Fall Precaution Comments: fall risk secondary to  funcitonal balance Restrictions Weight Bearing Restrictions: No  Pain:  Denies pain   Therapy/Group: Individual Therapy  Mal Amabile 05/18/2023, 8:51 AM

## 2023-05-18 NOTE — IPOC Note (Signed)
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[facility])     Functional Problem List: Nursing Safety, Bowel, Medication Management, Motor, Pain  PT Balance, Behavior, Endurance, Motor, Safety, Perception  OT Balance, Endurance, Safety, Other (Comment) (Due to challenges with UB strength)  SLP Cognition  TR         Basic ADL's: OT Dressing, Other (comment)     Advanced  ADL's: OT Simple Meal Preparation     Transfers: PT Bed Mobility, Bed to Chair, Car, Furniture  OT       Locomotion: PT Ambulation, Stairs     Additional Impairments: OT Other (comment) (UB strength and activity tolerance)  SLP Social Cognition   Problem Solving, Attention, Memory  TR      Anticipated Outcomes Item Anticipated Outcome  Self Feeding ModI  Swallowing      Basic self-care  ModI  Toileting  ModI   Bathroom Transfers ModI  Bowel/Bladder  continent B/B  Transfers  supervision  Locomotion  supervision ambulatory with LRAD  Communication     Cognition  Mod I  Pain  less than 4  Safety/Judgment  no falls   Therapy Plan: PT Intensity: Minimum of 1-2 x/day ,45 to 90 minutes PT Frequency: 5 out of 7 days PT Duration Estimated Length of Stay: 7-10 days OT Frequency: 5 out of 7 days OT Duration/Estimated Length of Stay: 7-10 days SLP Intensity: Minumum of 1-2 x/day, 30 to 90 minutes SLP Frequency: 3 to 5 out of 7 days SLP Duration/Estimated Length of Stay: 7-10 days   Team Interventions: Nursing Interventions Patient/Family Education, Bowel Management, Medication Management, Pain Management, Discharge Planning  PT interventions Ambulation/gait training, Discharge planning, Functional mobility training, Psychosocial support, Therapeutic Activities, Visual/perceptual remediation/compensation, Designer, jewellery, Neuromuscular re-education, Therapeutic Exercise, Wheelchair propulsion/positioning, Disease management/prevention, Cognitive remediation/compensation, DME/adaptive equipment instruction, Pain management, UE/LE Strength taining/ROM, Community reintegration, Equities trader education, Museum/gallery curator, UE/LE Coordination activities  OT Interventions Warden/ranger, Therapeutic Activities, Self Care/advanced ADL retraining, Therapeutic Exercise, UE/LE Strength taining/ROM  SLP Interventions Cognitive remediation/compensation, Financial trader, Environmental controls, Functional tasks, Internal/external aids, Patient/family education, Therapeutic Activities  TR Interventions    SW/CM Interventions Discharge Planning, Psychosocial Support, Patient/Family Education   Barriers to Discharge MD  Medical stability  Nursing Decreased caregiver support, Home environment access/layout, Wound Care home with spouse 1 level 3 ste entry with rail on left  PT Inaccessible home environment, Home environment access/layout, Decreased caregiver support pt wife unable to physically assist, 3 STE with no HRs, flight of stairs to bedroom  OT      SLP Decreased caregiver support    SW Decreased caregiver support, Lack of/limited family support, Community education officer for SNF coverage     Team Discharge Planning: Destination: PT-Home ,OT- Home , SLP-Home Projected Follow-up: PT-Outpatient PT, OT-  None, SLP-24 hour supervision/assistance, Outpatient SLP Projected Equipment Needs: PT-None recommended by PT, OT- 3 in 1 bedside comode, Rolling walker with 5" wheels, SLP-None recommended by SLP Equipment Details: PT-pt owns RW and SPC, OT-secondary to challenges with balance during functional mobility, the pt would benefit from bedside commode to prevent falls at night and a RW during functional mobility Patient/family involved in discharge planning: PT- Patient, Family Adult nurse,  OT-Patient, Family  member/caregiver, SLP-Patient, Family member/caregiver  MD ELOS: 7-10 days Medical Rehab Prognosis:  Excellent Assessment: The patient has been admitted for CIR therapies  with the diagnosis of brain tumor s/p resection. The team will be addressing functional mobility, strength, stamina, balance, safety, adaptive techniques and equipment, self-care, bowel and bladder mgt, patient and caregiver education, NMR, cognition, community reentry. Goals have been set at mod I for self-care and cognition and supervision for mobility. Anticipated discharge destination is home with family.        See Team Conference Notes for weekly updates to the plan of care

## 2023-05-18 NOTE — Care Management (Signed)
Inpatient Rehabilitation Center Individual Statement of Services  Patient Name:  Alex Church  Date:  05/18/2023  Welcome to the Inpatient Rehabilitation Center.  Our goal is to provide you with an individualized program based on your diagnosis and situation, designed to meet your specific needs.  With this comprehensive rehabilitation program, you will be expected to participate in at least 3 hours of rehabilitation therapies Monday-Friday, with modified therapy programming on the weekends.  Your rehabilitation program will include the following services:  Physical Therapy (PT), Occupational Therapy (OT), Speech Therapy (ST), 24 hour per day rehabilitation nursing, Therapeutic Recreaction (TR), Psychology, Neuropsychology, Care Coordinator, Rehabilitation Medicine, Nutrition Services, Pharmacy Services, and Other  Weekly team conferences will be held on Tuesdays to discuss your progress.  Your Inpatient Rehabilitation Care Coordinator will talk with you frequently to get your input and to update you on team discussions.  Team conferences with you and your family in attendance may also be held.  Expected length of stay: 7-10 days    Overall anticipated outcome: Supervision  Depending on your progress and recovery, your program may change. Your Inpatient Rehabilitation Care Coordinator will coordinate services and will keep you informed of any changes. Your Inpatient Rehabilitation Care Coordinator's name and contact numbers are listed  below.  The following services may also be recommended but are not provided by the Inpatient Rehabilitation Center:  Driving Evaluations Home Health Rehabiltiation Services Outpatient Rehabilitation Services Vocational Rehabilitation   Arrangements will be made to provide these services after discharge if needed.  Arrangements include referral to agencies that provide these services.  Your insurance has been verified to be:  BCBS  Your primary doctor is:   Willow Ora  Pertinent information will be shared with your doctor and your insurance company.  Inpatient Rehabilitation Care Coordinator:  Susie Cassette [phone] or ([phone]  Information discussed with and copy given to patient by: Gretchen Short, 05/18/2023, 9:26 AM

## 2023-05-19 DIAGNOSIS — D496 Neoplasm of unspecified behavior of brain: Secondary | ICD-10-CM | POA: Diagnosis not present

## 2023-05-19 DIAGNOSIS — E871 Hypo-osmolality and hyponatremia: Secondary | ICD-10-CM | POA: Diagnosis not present

## 2023-05-19 DIAGNOSIS — E119 Type 2 diabetes mellitus without complications: Secondary | ICD-10-CM | POA: Diagnosis not present

## 2023-05-19 DIAGNOSIS — N3001 Acute cystitis with hematuria: Secondary | ICD-10-CM

## 2023-05-19 DIAGNOSIS — I10 Essential (primary) hypertension: Secondary | ICD-10-CM | POA: Diagnosis not present

## 2023-05-19 LAB — BASIC METABOLIC PANEL
Anion gap: 7 (ref 5–15)
BUN: 30 mg/dL — ABNORMAL HIGH (ref 8–23)
CO2: 25 mmol/L (ref 22–32)
Calcium: 8.2 mg/dL — ABNORMAL LOW (ref 8.9–10.3)
Chloride: 99 mmol/L (ref 98–111)
Creatinine, Ser: 0.97 mg/dL (ref 0.61–1.24)
GFR, Estimated: >60 mL/min (ref >60)
Glucose, Bld: 100 mg/dL — ABNORMAL HIGH (ref 70–99)
Potassium: 4.6 mmol/L (ref 3.5–5.1)
Sodium: 131 mmol/L — ABNORMAL LOW (ref 135–145)

## 2023-05-19 LAB — GLUCOSE, CAPILLARY
Glucose-Capillary: 100 mg/dL — ABNORMAL HIGH (ref 70–99)
Glucose-Capillary: 157 mg/dL — ABNORMAL HIGH (ref 70–99)
Glucose-Capillary: 167 mg/dL — ABNORMAL HIGH (ref 70–99)
Glucose-Capillary: 114 mg/dL — ABNORMAL HIGH (ref 70–99)

## 2023-05-19 MED ORDER — CEPHALEXIN 250 MG PO CAPS
250.0000 mg | ORAL_CAPSULE | Freq: Four times a day (QID) | ORAL | Status: DC
  Administered 2023-05-19 – 2023-05-20 (×4): 250 mg via ORAL
  Filled 2023-05-19 (×4): qty 1

## 2023-05-19 NOTE — Discharge Instructions (Addendum)
Inpatient Rehab Discharge Instructions  Alex Church [facility] Discharge date and time: 05/23/2023   Activities/Precautions/ Functional Status: Activity: no lifting, driving, or strenuous exercise until cleared by MD Diet: cardiac diet Wound Care: keep wound clean and dry Functional status:  ___ No restrictions     ___ Walk up steps independently __x_ 24/7 supervision/assistance   ___ Walk up steps with assistance ___ Intermittent supervision/assistance  ___ Bathe/dress independently ___ Walk with walker     ___ Bathe/dress with assistance ___ Walk Independently    ___ Shower independently ___ Walk with assistance    _x__ Shower with assistance _x__ No alcohol     ___ Return to work/school ________  Special Instructions: No driving, alcohol consumption or tobacco use.  Recommend daily BP measurement in same arm and record time of day. Bring this information with you to follow-up appointment with PCP.  Check fingerstick blood glucose twice daily.  COMMUNITY REFERRALS UPON DISCHARGE:    Outpatient: PT     OT    ST             Agency:[facility]  [phone]              Appointment Date/Time:*Please expect follow-up within 7-10 business days to schedule your appointment. If you have not received follow-up, be sure to contact the site directly.*   Medical Equipment/Items Ordered:shower chair, rolling walker, and 3in1 bedside commode                                                 Agency/Supplier:[facility] [phone]   My questions have been answered and I understand these instructions. I will adhere to these goals and the provided educational materials after my discharge from the hospital.  Patient/Caregiver Signature _______________________________ Date __________  Clinician Signature _______________________________________ Date __________  Please bring this form and your medication list with you to all your follow-up doctor's appointments.

## 2023-05-19 NOTE — Progress Notes (Signed)
PROGRESS NOTE   Subjective/Complaints: Patient continues to have some burning when he urinates.  UA yesterday indicates UTI.  No additional concerns.  ROS: Patient denies fever, rash, sore throat, blurred vision, dizziness, nausea, vomiting, diarrhea, cough, shortness of breath or chest pain, joint or back/neck pain, headache, or mood change.  + Dysuria  Objective:   No results found. Recent Labs    05/18/23 0701  WBC 24.5*  HGB 11.9*  HCT 36.7*  PLT 177   Recent Labs    05/18/23 0701 05/19/23 0538  NA 134* 131*  K 3.9 4.6  CL 101 99  CO2 26 25  GLUCOSE 123* 100*  BUN 26* 30*  CREATININE 0.88 0.97  CALCIUM 8.0* 8.2*    Intake/Output Summary (Last 24 hours) at 05/19/2023 0937 Last data filed at 05/19/2023 0700 Gross per 24 hour  Intake 1328.59 ml  Output 1050 ml  Net 278.59 ml        Physical Exam: Vital Signs Blood pressure (!) 120/58, pulse (!) 56, temperature 97.7 F (36.5 C), resp. rate 18, weight 86.1 kg, SpO2 100%.  Constitutional: No distress . Vital signs reviewed. HEENT: crani incision intact with scab, foam dressing.  EOMI, oral membranes moist Neck: supple Cardiovascular: RRR without murmur. No JVD    Respiratory/Chest: CTA Bilaterally without wheezes or rales. Normal effort    GI/Abdomen: BS +, non-tender, non-distended Ext: no clubbing, cyanosis, or edema Psych: pleasant and cooperative  Skin: Clean and intact   Bruising noted left upper extremity and right neck-improving Neuro: Alert and awake, reasonable insight and awareness.  follows commands, cranial nerves II through XII grossly intact Moving all extremities to gravity and resistance Sensory exam normal for light touch and pain in all 4 limbs. No limb ataxia or cerebellar signs. No abnormal tone appreciated.    Musculoskeletal: No joint swelling or tenderness No hypertonia noted    Assessment/Plan: 1. Functional deficits which  require 3+ hours per day of interdisciplinary therapy in a comprehensive inpatient rehab setting. Physiatrist is providing close team supervision and 24 hour management of active medical problems listed below. Physiatrist and rehab team continue to assess barriers to discharge/monitor patient progress toward functional and medical goals  Care Tool:  Bathing    Body parts bathed by patient: Right arm, Left arm, Chest, Abdomen, Front perineal area, Buttocks, Right upper leg, Left upper leg, Right lower leg, Left lower leg, Face         Bathing assist Assist Level: Independent with assistive device Assistive Device Comment: RW with MinA for LB task performance   Upper Body Dressing/Undressing Upper body dressing   What is the patient wearing?: Pull over shirt    Upper body assist Assist Level: Set up assist    Lower Body Dressing/Undressing Lower body dressing      What is the patient wearing?: Underwear/pull up, Pants     Lower body assist Assist for lower body dressing: Minimal Assistance - Patient > 75%     Toileting Toileting    Toileting assist Assist for toileting: Independent with assistive device     Transfers Chair/bed transfer  Transfers assist     Chair/bed transfer assist level: Contact  Guard/Touching assist     Locomotion Ambulation   Ambulation assist      Assist level: Contact Guard/Touching assist Assistive device: Walker-rolling Max distance: 180'   Walk 10 feet activity   Assist     Assist level: Contact Guard/Touching assist Assistive device: Walker-rolling   Walk 50 feet activity   Assist    Assist level: Contact Guard/Touching assist Assistive device: Walker-rolling    Walk 150 feet activity   Assist    Assist level: Contact Guard/Touching assist Assistive device: Walker-rolling    Walk 10 feet on uneven surface  activity   Assist     Assist level: Contact Guard/Touching assist Assistive device:  Walker-rolling   Wheelchair     Assist Is the patient using a wheelchair?: Yes Type of Wheelchair: Manual (distance)    Wheelchair assist level: Supervision/Verbal cueing Max wheelchair distance: 150'    Wheelchair 50 feet with 2 turns activity    Assist        Assist Level: Supervision/Verbal cueing   Wheelchair 150 feet activity     Assist      Assist Level: Supervision/Verbal cueing   Blood pressure (!) 120/58, pulse (!) 56, temperature 97.7 F (36.5 C), resp. rate 18, weight 86.1 kg, SpO2 100%.  Medical Problem List and Plan: 1. 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 Jake Samples on 05/12/2023             -patient may shower, cover surgical incisions             -ELOS/Goals: 7-10 days, Mod I PT/OT/SLP             -Continue CIR therapies including PT, OT, and SLP   Team conference today please see physician documentation under team conference tab.  Team conference note completed.   2.  Antithrombotics: -DVT/anticoagulation:  Pharmaceutical: Heparin started 9/05             -antiplatelet therapy: none   3. Pain Management: Tylenol, Norco as needed   4. Mood/Behavior/Sleep: LCSW to evaluate and provide emotional support             -will increase melatonin to 10mg  q HS  -rx likely UTI             -antipsychotic agents: n/a              5. Neuropsych/cognition: This patient is capable of making decisions on his own behalf.   6. Skin/Wound Care: Routine skin care checks             -monitor surgical incision   7. Fluids/Electrolytes/Nutrition: Routine Is and Os and follow-up chemistries             -adjust to carb modified diet             -monitor PO intake (no current documentation)   8: Hypertension: monitor TID and prn, has been controlled overall             -continue carvedilol 12.5 mg Bid             -continue clonidine 0.2 mg BID             -continue  lisinopril 20 mg BID  -9/7 change lisinopril to 10 mg daily due to soft BP  -9/10 BP soft, discontinue lisinopril     05/18/2023    6:36 PM 05/18/2023    1:10 PM 05/18/2023  5:00 AM  Vitals with BMI  Systolic 120 104 696  Diastolic 58 58 53  Pulse  56 57      9: Hyperlipidemia: continue Lipitor 10 mg   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 Jake Samples, follow-up with Dr. Barbaraann Cao   11: DM-2: CBGs QID; A1c = 6.9%, steroid induced hyperglycemia (home meds include Januvia 100 mg daily, metformin 1000 mg twice daily, Actos 30 mg daily>>not restarted)             -continue SSI             -continue Semglee 16 units daily  -9/7 increase Semglee to 18 units daily, may need to wean down as steroids are reduced  -9/8 Restart metformin at 500mg  BID  -9/10 CBGs improved, continue to monitor CBG (last 3)  Recent Labs    05/18/23 2129 05/19/23 0617 05/19/23 1137  GLUCAP 191* 100* 157*      12: GI prophylaxis: continue Protonix BID while on steroids   13: Hyponatremia: Last sodium 130 9/6, follow-up BMP -NA 126, asymptomatic start salt tabs, fluid restriction, recheck labs later today, check urine osmolality and sodium -Decrease lisinopril dose to 10 mg daily -9/10 sodium a little lower today at 131, lisinopril was discontinued, he is on normal saline 50 cc an hour-will discontinue today.  Recheck BMP tomorrow    Latest Ref Rng & Units 05/19/2023    5:38 AM 05/18/2023    7:01 AM 05/17/2023    6:18 PM  BMP  Glucose 70 - 99 mg/dL 295  284  132   BUN 8 - 23 mg/dL 30  26  31    Creatinine 0.61 - 1.24 mg/dL 4.40  1.02  7.25   Sodium 135 - 145 mmol/L 131  134  130   Potassium 3.5 - 5.1 mmol/L 4.6  3.9  4.7   Chloride 98 - 111 mmol/L 99  101  98   CO2 22 - 32 mmol/L 25  26  21    Calcium 8.9 - 10.3 mg/dL 8.2  8.0  7.8       14: Mild anemia: Hgb 11.5 9/6, follow-up CBC  -9/9 Hgb stable 11.9    14: Leukocytosis:               -9/9 WBC continues to be elevated 24.5 , monitor for signs of infection. Likely steroids    -ua,ucx- indicates UTI  15.  Acute metabolic encephalopathy: TSH/B12 stable.  Improving   16.  UTI  -UAUCX  -indicates UTI, will start Keflex 9/10 and monitor urine cultures  LOS: 4 days A FACE TO FACE EVALUATION WAS PERFORMED  Fanny Dance 05/19/2023, 9:37 AM

## 2023-05-19 NOTE — Progress Notes (Signed)
Speech Language Pathology Daily Session Note  Patient Details  Name: Alex Church MRN: [id number] Date of Birth: 15-Mar-1946  Today's Date: 05/19/2023 SLP Individual Time: 1300-1330 SLP Individual Time Calculation (min): 30 min  Short Term Goals: Week 1: SLP Short Term Goal 1 (Week 1): STGs=LTGs due ELOS  Skilled Therapeutic Interventions:   Pt, spouse, and family member greeted at bedside after noon meal. He was sitting upright in his wc upon SLP arrival. He was awake, though fatigue was evident. Wife and pt endorsed increased fatigue this afternoon. With s verbal cues/assistance from his wife, he recalled details re his tx sessions from yesterday afternoon, this morning, and details re his tx team. SLP challenged him with a recall task re upcoming PT tx session, therapist, and time for tx session. Between recall times (~20 mins), he completed a moderately complex time management task independently and a verbal working memory task (3 details to recall) @ modI. He independently utilized his schedule to recall 3/3 details re upcoming PT tx session w/ 100% accuracy. At the end of the tx session, he was left in his chair w/ his wife present to assist as needed. Recommend cont ST per POC.   Pain  No pain reported.  Therapy/Group: Individual Therapy  [name] 05/19/2023, 5:05 PM

## 2023-05-19 NOTE — Progress Notes (Signed)
Patient ID: Alex Church, male   DOB: 03/17/1946, 77 y.o.   MRN: [id number]  SW met with pt, pt wife, and pt SIL to provide updates from team conference, and d/c date 9/14. SW discussed outpatient therapies. Prefers [facility]. Confirms has a RW already. SW reiterated for the first week or so, pt should have support daily. Pt SIL to stay with him while wife is at work. No other questions/concerns reported.   Cecile Sheerer, MSW, LCSWA Office: [phone] Cell: [phone] Fax: [phone]

## 2023-05-19 NOTE — Progress Notes (Signed)
Physical Therapy Session Note  Patient Details  Name: Alex Church MRN: [id number] Date of Birth: Oct 13, 1945  Today's Date: 05/19/2023 PT Individual Time: 1003-1101  PT Individual Time Calculation (min): 58 min  Short Term Goals: Week 1:  PT Short Term Goal 1 (Week 1): STG = LTG secondary to ELOS  Skilled Therapeutic Interventions/Progress Updates:    Pt received in WC, NT present for CHG bath, family present, alarm activated.  Pt performed multiple STS throughout session from mat table & once from [facility]. CGA for safety concerns, no AD.  Endurance: Ambulation from room > big gym, RW & CGA at beginning of session. At end of session Big gym > room w/ RW, CGA.  Gait training:  - To improve intermittent mild L postural lean. Use of RW pt ambulating inf ront of mirror for midline awareness 50 ft & continuing 50 ft w/o use of mirror. Progress to no AD 160 ft (90ft w/ use of mirror for postural awareness). Mild improvements in awareness.  - 284ft , no AD w/ verbal cue to walk fast. Metronome to 95 bpm added to facilitate speed. Metronome unsuccessful d/t attention deficit. Tactile cueing on patients back to facilitate increased speed. Mild improvements in gait speed and step length. CGA - Pt stepping on 7 colored dots spread apart to encourage step length & width x6 ; progress to completing colored dots and then continuing ambulation for ~10-15 ft past the dots. Pt demo difficulty continuing step length past dots d/t attention as well as anticipating the need to turn or stop. When anticipating turn or stop pt reverts back to shuffled gait pattern. CGA - MinA. 1 mild LOB w/ steppage recovery, pt stepped L foot over R foot causing imbalance.  - To encourage step length & step height. Pt stepping over 5 short hurdles alternating pattern x8. L UE support for ~90% of the time for 4 reps. RUE for ~80% of the time for 4 reps. CGA no LOB; progressed to completing w/ no UE support away from the railing to  encourage Pt's confidence in self & in PT, X6 , CGA - MinA, mild LOB x3 d/t L foot crossing over R foot.  Pt left at end of session in [facility], family present, alarm activated, all needs met.  PT suggested toileting at beginning and end of session but patient reported not needing to go at the time.  Therapy Documentation Precautions:  Precautions Precautions: Fall Precaution Comments: fall risk secondary to funcitonal balance Restrictions Weight Bearing Restrictions: No Pain:   Pt reports no pain during session.   Therapy/Group: Individual Therapy  [name] 05/19/2023, 10:21 AM

## 2023-05-19 NOTE — Progress Notes (Signed)
Physical Therapy Session Note  Patient Details  Name: ABDALLAH LEDGERWOOD MRN: [id number] Date of Birth: February 24, 1946  Today's Date: 05/19/2023 PT Individual Time: 1003-1101 PT Individual Time Calculation (min): 58 min   Short Term Goals: Week 1:  PT Short Term Goal 1 (Week 1): STG = LTG secondary to ELOS  Skilled Therapeutic Interventions/Progress Updates:    Pt presents in room in [facility], wife present, agreeable to PT. Pt denies pain. Session focused on gait training for dynamic postural stability and tolerance to upright mobility as well as therapeutic exercise to promote BUE/BLE strengthening needed for functional mobility and stair negotiation. Pt completes sit<>stands CGA.  Pt self propels WC from room to day room ~300' as cardiovascular warm up prior to activity, supervision for task. Pt completes ambulation 3x175' without device CGA/min assist, pt demonstrating inconsistent step length and foot placement x2 LOB with each trial except last with pt demonstrating decreasing BLE coordination with distractions. Pt provided with education during rest breaks on limiting distractions including not talking during ambulation with pt able to ambulate last trial without talking demonstrates improved coordination with only x1 LOB requiring min assist to correct.  Pt then completes ambulatory transfer to [facility] and transitions to sitting. Pt completes seated hip extension on kinetron at workload 15 cm/sec for 2 minutes. Pt then completes standing hip extension on kinetron, interval training 30 sec work 30 sec rest for 8 minutes total. Pt demonstrates fatigue with final trial with pt unable to achieve full knee extension and requires assist to sit posteriorly to kinetron seat. Pt completes ambulatory transfer back to [facility] following extended seated rest break.  Pt returns to room and remains seated in [facility] with all needs within reach, cal light in place and chair alarm donned and activated at end of session. Pt wife at  bedside and educated on pt CLOF, what to expect for family training with pt wife verbalizing understanding.   Therapy Documentation Precautions:  Precautions Precautions: Fall Precaution Comments: fall risk secondary to funcitonal balance Restrictions Weight Bearing Restrictions: No   Therapy/Group: Individual Therapy  Edwin Cap PT, DPT 05/19/2023, 2:22 PM

## 2023-05-19 NOTE — Progress Notes (Signed)
Occupational Therapy Session Note  Patient Details  Name: Alex Church MRN: [id number] Date of Birth: 08-28-1946  Today's Date: 05/19/2023 OT Individual Time: 6644-0347 OT Individual Time Calculation (min): 60 min    Short Term Goals: Week 1:  OT Short Term Goal 1 (Week 1): The pt will improve UB strength to 4/5MMT at 95% safe OT Short Term Goal 2 (Week 1): The pt will complete LB bathing and dressing with ModI  and 95% safe using AE as needed OT Short Term Goal 3 (Week 1): The pt will tolerate > 30 minutes of skilled OT with minimal rest breaks at 95% safe OT Short Term Goal 4 (Week 1): The pt will complete a simple meal with ModI at 95% safe during performance with rest breaks as needed. OT Short Term Goal 5 (Week 1): The pt will demonstrate good funcitonal balance during functional task performance at 95% safe .  Skilled Therapeutic Interventions/Progress Updates:    Pt greeted semi-reclined in bed with spouse present. Spouse discussed issues with getting urine sample yesterday with mislabeling of urine cup. Pt agreeable to get up to go to the bathroom and complete bathing tasks. Pt ambulated to the bathroom w/ RW and CGA. Educated on RW positioning over the toilet for voiding. Pt completed shaving task in standing with close supervision. Pt completed bathing/dressing tasks sit<>stand at the sink with overall CGA/supervision. Pt ambulated to therapy gym w/ RW and cues for step length. Standing balance/endurance with alternating toe taps on small cones with and without RW. Min to mod A for balance without RW. Pt ambulated back to room w/ RW and close supervision. Pt left seated in wc with alarm belt on, call bell in reach, wife present, and needs met.   Therapy Documentation Precautions:  Precautions Precautions: Fall Precaution Comments: fall risk secondary to funcitonal balance Restrictions Weight Bearing Restrictions: No Pain:  Denies pain   Therapy/Group: Individual  Therapy  Mal Amabile 05/19/2023, 8:47 AM

## 2023-05-19 NOTE — Discharge Summary (Incomplete)
Physician Discharge Summary  Patient ID: Alex Church MRN: [id number] DOB/AGE: [birth year] 77 y.o.  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 Insomnia Hypertension Hyperlipidemia Diabetes mellitus type 2 Hyponatremia Mild anemia Leukocytosis Acute metabolic encephalopathy  Discharged Condition: {condition:18240}  Significant Diagnostic Studies:  Labs:  Basic Metabolic Panel: Recent Labs  Lab 05/12/23 1844 05/13/23 0500 05/14/23 0625 05/15/23 0536 05/16/23 0444 05/16/23 1758 05/17/23 0836 05/17/23 1818 05/18/23 0701 05/19/23 0538  NA 134* 136 133* 130* 126* 131* 132* 130* 134* 131*  K 4.3 4.1 4.7 4.2 4.1 4.7 4.3 4.7 3.9 4.6  CL 103 106 102 98 97* 95* 97* 98 101 99  CO2 20* 19* 24 21* 20* 21* 24 21* 26 25  GLUCOSE 190* 118* 110* 150* 209* 305* 272* 282* 123* 100*  BUN 38* 37* 30* 23 21 33* 29* 31* 26* 30*  CREATININE 1.01 0.97 0.98 0.87 0.89 1.09 0.96 1.05 0.88 0.97  CALCIUM 8.0* 8.0* 8.1* 7.8* 7.8* 8.9 8.3* 7.8* 8.0* 8.2*  MG  --  1.8 1.9  --   --   --   --   --   --   --     CBC: Recent Labs  Lab 05/15/23 0537 05/16/23 0444 05/18/23 0701  WBC 25.4* 27.2* 24.5*  NEUTROABS  --  24.9*  --   HGB 11.5* 11.5* 11.9*  HCT 35.0* 34.3* 36.7*  MCV 90.2 90.3 90.8  PLT 161 156 177    CBG: Recent Labs  Lab 05/18/23 1614 05/18/23 2129 05/19/23 0617 05/19/23 1137 05/19/23 1643  GLUCAP 208* 191* 100* 157* 167*    Brief HPI:   Alex Church is a 77 y.o. male was admitted to [facility] on 05/05/2023 by way of transfer from [facility] with new diagnosis of right frontal brain mass after presenting from home to [facility] ED complaining of left-sided weakness. The patient presents with complaint of 4 days of left-sided weakness involving the left upper and lower extremity, starting on  Saturday, 05/02/2023. Denied any associated acute focal numbness or paresthesias nor any acute vertigo, dysphagia, dysarthria, facial droop, acute change in vision. The patient's family also conveyed that the patient had appeared progressively confused over the last few months, starting in May 2024. Over that time, they had noticed the patient becoming more forgetful, getting lost while driving, although very familiar with these routes. MRI brain with and without contrast showed large mass located within the right frontal lobe measuring 6.8 x 4.9 cm associate with mass effect on the right lateral ventricle and 6 mm of leftward midline shift in the absence of any evidence of acute infarct in the absence of any evidence of intracranial hemorrhage. EDP at [facility] d/w on-call neurosurgeon, Dr. Jake Samples , who recommended TRH admission to [facility]. Dr. Jake Samples conveyed that neurosurgery would formally consult, and did not feel that emergent surgery is indicated. Rather, neurosurgery anticipated surgical intervention on Thursday, 05/07/2023, and recommends interval Decadron 4 mg every 6 hours. The patient underwent right stereotactic frontal craniotomy for resection of tumor by Dr. Jake Samples. On exam 9/05, speech fluent, CNs grossly intact, 5/5 BUE/BLE. Remains on steroid taper and on Keppra. Tolerating diet. He ambulated ~125' with RW and min assist fading to close CGA for safety. No significant lean/list Lt with gait but overall continues to have reduced Df on Lt LE and decreased  step length on Lt. Patient reports he has not been sleeping well and feels tired during the day.    Hospital Course: Alex Church was admitted to rehab 05/15/2023 for inpatient therapies to consist of PT, ST and OT at least three hours five days a week. Past admission physiatrist, therapy team and rehab RN have worked together to provide customized collaborative inpatient rehab. Follow-up labs: NA 126, asymptomatic start salt tabs, fluid restriction, recheck  labs later today, check urine osmolality and sodium. Hgb stable. UA ordered for dysuria 9/08. FR  to 1200 cc daily due to hyponatremia. Started NS at 50 cc/hr to speed up urination for UA. UA positive and Keflex started. Awaited cultures.  Serum sodium down to 131 on 9/10 and IV fluids discontinued.   Blood pressures were monitored on TID basis and carvedilol 12.5 mg BID, clonidine 0.2 mg BID and lisinopril 20 mg BID continued.             -9/7 changed lisinopril to 10 mg daily due to soft BP    Diabetes has been monitored with ac/hs CBG checks and SSI was use prn for tighter BS control. Semglee 16 units continued. 9/07 increased Semglee to 18 units daily.  Restarted metformin 500 mg twice daily on 9/08.   Rehab course: During patient's stay in rehab weekly team conferences were held to monitor patient's progress, set goals and discuss barriers to discharge. At admission, patient required min with mobility and modified independent  with basic self-care skills.  He has had improvement in activity tolerance, balance, postural control as well as ability to compensate for deficits. He has had improvement in functional use RUE/LUE  and RLE/LLE as well as improvement in awareness       Disposition:  There are no questions and answers to display.         Diet:  Special Instructions:   Allergies as of 05/19/2023   No Known Allergies   Med Rec must be completed prior to using this [facility]***       Follow-up Information     Fanny Dance, MD Follow up.   Specialty: Physical Medicine and Rehabilitation Contact information: [address] [place] [address] [phone]         Wanda Plump, MD Follow up.   Specialty: Internal Medicine Why: Call the office in 1-2 days to make arrangments for hospital follow-up appointment. Contact information: [address] [place] [address] [phone]         Dawley, Alan Mulder, DO  Follow up.   Why: Call the office in 1-2 days to make arrangments for hospital follow-up appointment. Contact information: [address] [place] [address] [phone]         Henreitta Leber, MD Follow up.   Specialties: Psychiatry, Neurology, Oncology Why: Call the office in 1-2 days to make arrangments for hospital follow-up appointment. Contact information: [address] [place] [address] [phone]                 Signed: Milinda Antis 05/19/2023, 4:47 PM

## 2023-05-19 NOTE — Patient Care Conference (Signed)
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 Faith Rogue Social Worker Present: Cecile Sheerer, LCSWA Nurse Present: Vedia Pereyra, RN PT Present: Darrold Span, PT OT Present: Kearney Hard, OT SLP Present: Feliberto Gottron, SLP PPS Coordinator present : Fae Pippin, SLP     Current Status/Progress Goal Weekly Team Focus  Bowel/Bladder   Pt is continent of b/b. LBM: 9/11   Remain continent of b/b.   Assist w/ toileting needs as needed.    Swallow/Nutrition/ Hydration               ADL's   SUpervision/CGA for BADL tasks   mod I/ supervision   self-care retraining, dc planning, balance, activity tolerance, barrier is wife goes back to work next week, so will need to be intermittent supervisoin    Mobility   CGA overall, ambulation with RW 180'   supervision  continue gait training, stair training, and NMR for dynamic standing balance    Communication                Safety/Cognition/ Behavioral Observations  Supervision   Mod I   complex problem solving, recall with use of strateiges, selective attention    Pain   Pt denies pain.   Remain pain free.   Assess pain q shift & PRN.    Skin   Incision to head - foam   Incision site will heal properly w/o any complications.  Assess skin q shift & PRN.      Discharge Planning:  Pt will d/c to home with wife who will be  primary caregiver. Pt will need to be intermittent supervision to Mod I at discharge since wife works 7:30am-5pm. Pt will see if family is able to stay with him periodically throughout the day. Fam edu scheduled for Wed (9/11) 8am-12pm. SW will confirm there are no barriers to discharge.   Team Discussion: Glial neoplasm of the brain. Continent of bowel/bladder. Pain managed with PRN medications. Incision to head has staples with foam dressing in place. Wife plans to return to work on Monday after discharge and was provided a Statistician.  Recommending 24/7 supervision.  Blood sugars remain elevated. Tolerating carb-mod diet with 1200 fluid restriction.  Doing well with therapy.  Patient on target to meet rehab goals: yes, progressing towards goals with a discharge date of 05/23/23  *See Care Plan and progress notes for long and short-term goals.   Revisions to Treatment Plan:  U/A with culture and sensitivity.  Keflex/UTI. Adjusting insulin. Medication adjustments. Monitor labs/VS Teaching Needs: Medications, safety, self care, gait/transfer training, skin/wound care, etc.   Current Barriers to Discharge: New diabetic, Wound care, and Lack of/limited family support  Possible Resolutions to Barriers: Family education Independent with glucose meter and insulin Independent with wound care Arrange support while spouse is at work Order recommended DME if needed.  Medical Summary Current Status: right frontal brain mass,  DM2, UTI, hyponatremia  Barriers to Discharge: Medical stability;Infection/IV Antibiotics;Electrolyte abnormality  Barriers to Discharge Comments: right frontal brain mass, DM2, UTI, hyponatremia Possible Resolutions to [employer] Focus: Start keflex for UTI, continue salt tabs and monitor Na   Continued Need for Acute Rehabilitation Level of Care: The patient requires daily medical management by a physician with specialized training in physical medicine and  rehabilitation for the following reasons: Direction of a multidisciplinary physical rehabilitation program to maximize functional independence : Yes Medical management of patient stability for increased activity during participation in an intensive rehabilitation regime.: Yes Analysis of laboratory values and/or radiology reports with any subsequent need for medication adjustment and/or medical intervention. : Yes   I attest that I was present, lead the team conference, and concur with the assessment and plan of the team.   Jearld Adjutant 05/19/23, 10:34 AM

## 2023-05-20 DIAGNOSIS — D496 Neoplasm of unspecified behavior of brain: Secondary | ICD-10-CM | POA: Diagnosis not present

## 2023-05-20 DIAGNOSIS — E871 Hypo-osmolality and hyponatremia: Secondary | ICD-10-CM | POA: Diagnosis not present

## 2023-05-20 DIAGNOSIS — I10 Essential (primary) hypertension: Secondary | ICD-10-CM | POA: Diagnosis not present

## 2023-05-20 DIAGNOSIS — E119 Type 2 diabetes mellitus without complications: Secondary | ICD-10-CM | POA: Diagnosis not present

## 2023-05-20 LAB — BASIC METABOLIC PANEL WITH GFR
Anion gap: 8 (ref 5–15)
BUN: 27 mg/dL — ABNORMAL HIGH (ref 8–23)
CO2: 24 mmol/L (ref 22–32)
Calcium: 8.1 mg/dL — ABNORMAL LOW (ref 8.9–10.3)
Chloride: 99 mmol/L (ref 98–111)
Creatinine, Ser: 0.96 mg/dL (ref 0.61–1.24)
GFR, Estimated: >60 mL/min
Glucose, Bld: 172 mg/dL — ABNORMAL HIGH (ref 70–99)
Potassium: 4.2 mmol/L (ref 3.5–5.1)
Sodium: 131 mmol/L — ABNORMAL LOW (ref 135–145)

## 2023-05-20 LAB — GLUCOSE, CAPILLARY
Glucose-Capillary: 136 mg/dL — ABNORMAL HIGH (ref 70–99)
Glucose-Capillary: 236 mg/dL — ABNORMAL HIGH (ref 70–99)
Glucose-Capillary: 210 mg/dL — ABNORMAL HIGH (ref 70–99)
Glucose-Capillary: 254 mg/dL — ABNORMAL HIGH (ref 70–99)

## 2023-05-20 LAB — URINE CULTURE: Culture: >=100000 — AB

## 2023-05-20 MED ORDER — INSULIN GLARGINE-YFGN 100 UNIT/ML ~~LOC~~ SOLN
14.0000 [IU] | Freq: Every day | SUBCUTANEOUS | Status: DC
  Filled 2023-05-20: qty 0.14

## 2023-05-20 MED ORDER — CARVEDILOL 12.5 MG PO TABS: 12.5000 mg | ORAL_TABLET | Freq: Two times a day (BID) | ORAL | Status: DC

## 2023-05-20 MED ORDER — PIOGLITAZONE HCL 30 MG PO TABS
30.0000 mg | ORAL_TABLET | Freq: Every day | ORAL | Status: DC
  Administered 2023-05-20 – 2023-05-23 (×4): 30 mg via ORAL
  Filled 2023-05-20 (×4): qty 1

## 2023-05-20 MED ORDER — LINAGLIPTIN 5 MG PO TABS
5.0000 mg | ORAL_TABLET | Freq: Every day | ORAL | Status: DC
  Administered 2023-05-20 – 2023-05-23 (×4): 5 mg via ORAL
  Filled 2023-05-20 (×4): qty 1

## 2023-05-20 MED ORDER — SODIUM CHLORIDE 1 G PO TABS
1.0000 g | ORAL_TABLET | Freq: Three times a day (TID) | ORAL | Status: DC
  Administered 2023-05-20 – 2023-05-23 (×9): 1 g via ORAL
  Filled 2023-05-20 (×9): qty 1

## 2023-05-20 MED ORDER — CEPHALEXIN 250 MG PO CAPS
250.0000 mg | ORAL_CAPSULE | Freq: Four times a day (QID) | ORAL | Status: DC
  Administered 2023-05-20 – 2023-05-23 (×12): 250 mg via ORAL
  Filled 2023-05-20 (×12): qty 1

## 2023-05-20 MED ORDER — INSULIN GLARGINE-YFGN 100 UNIT/ML ~~LOC~~ SOLN
12.0000 [IU] | Freq: Every day | SUBCUTANEOUS | Status: DC
  Administered 2023-05-21: 12 [IU] via SUBCUTANEOUS
  Filled 2023-05-20: qty 0.12

## 2023-05-20 MED ORDER — CLONIDINE HCL 0.1 MG PO TABS
0.1000 mg | ORAL_TABLET | Freq: Two times a day (BID) | ORAL | Status: DC
  Administered 2023-05-20 – 2023-05-23 (×6): 0.1 mg via ORAL
  Filled 2023-05-20 (×6): qty 1

## 2023-05-20 MED ORDER — CARVEDILOL 6.25 MG PO TABS
6.2500 mg | ORAL_TABLET | Freq: Two times a day (BID) | ORAL | Status: DC
  Administered 2023-05-20 – 2023-05-23 (×6): 6.25 mg via ORAL
  Filled 2023-05-20 (×6): qty 1

## 2023-05-20 NOTE — Progress Notes (Signed)
Physical Therapy Session Note  Patient Details  Name: Alex Church MRN: [id number] Date of Birth: [birth year]  Today's Date: 05/20/2023 PT Individual Time: 1534-1630 PT Individual Time Calculation (min): 56 min   Short Term Goals: Week 1:  PT Short Term Goal 1 (Week 1): STG = LTG secondary to ELOS  Skilled Therapeutic Interventions/Progress Updates:    Pt received up in chair, alarm activated, wife present. Nurse notified therapist of low BP however cleared pt to participate in therapy per pt tolerance. BP sitting 92/57(69) , 62 bpm BP standing 100/63(74), 71 bpm BP sitting at end of session 79/67(72), 73 bpm Pt denied any symptoms throughout session.  Endurance: room > transitional living apartment (~163ft), RW, supervision at beginning of session.   Stair navigation: stairwell x11 steps, alternating pattern, BHR, close supervision, verbal cues to use step to pattern when he is fatigued.  Dynamic balance: - Managing the kitchen: pt ambulating throughout kitchen searching through cabinets and drawers for 3 plates, forks, cups, and bowls. Pt asked to manage items safely over to kitchen table. Then bringing them back to the counter and placing in dishwasher. Pt able to manage a stack of 2-3 items in his L hand while supporting it on the walker and supporting himself with his R hand. Pt advised to use attached RW tray that was ordered by the wife when he returns home for safety and stability. No LOB, good management of RW & items w/ close supervision.  - Managing dog bowl: pt standing at tray table w/ RW, no UE, support scooping small pegs with a cup and placing in a bowl. (SPT carry bowl to mimic it being on pt's tray table). Pt ambulating w/ RW, supervision ~ 15 ft(distance he will have to walk to place his dog bowl on the ground). PT able to place bowl on the ground w/ no UE support and close supervision.  Pt ambulated back to room w/ RW, supervision, no LOB at end of session.  Education  given to pt and wife about keeping dog away from pt upon 1st arrival home until sitting in a chair to prevent any excessive excitement resulting in potential accidental LOB and fall.   Pt left in WC, alarm activated, all need met, wife present.  Therapy Documentation Precautions:  Precautions Precautions: Fall Precaution Comments: fall risk secondary to funcitonal balance Restrictions Weight Bearing Restrictions: No Pain:  Pt reports no pain throughout session.  Therapy/Group: Individual Therapy  [name] 05/20/2023, 8:00 AM

## 2023-05-20 NOTE — Progress Notes (Signed)
Occupational Therapy Session Note  Patient Details  Name: Alex Church MRN: [id number] Date of Birth: July 13, 1946  Today's Date: 05/20/2023 OT Individual Time: 4098-1191 OT Individual Time Calculation (min): 60 min    Short Term Goals: Week 1:  OT Short Term Goal 1 (Week 1): The pt will improve UB strength to 4/5MMT at 95% safe OT Short Term Goal 2 (Week 1): The pt will complete LB bathing and dressing with ModI  and 95% safe using AE as needed OT Short Term Goal 3 (Week 1): The pt will tolerate > 30 minutes of skilled OT with minimal rest breaks at 95% safe OT Short Term Goal 4 (Week 1): The pt will complete a simple meal with ModI at 95% safe during performance with rest breaks as needed. OT Short Term Goal 5 (Week 1): The pt will demonstrate good funcitonal balance during functional task performance at 95% safe .  Skilled Therapeutic Interventions/Progress Updates:    Family education session completed with pt and his wife [name]. Verbal education provided re fall risk reduction, energy conservation strategies, home carryover of transfer training, ADLs, and IADLs. Demonstration and hands on training completed for pt performance of UB/LB bathing and dressing at current CGA- (S) level, toileting hygiene and transfers, and shower transfers. He completed 150 ft of functional mobility with the RW to the therapy gym with CGA overall. He completed simulated walk in shower transfer following demonstration with CGA. Coordinated equipment needs with CSW and pt/wife. He returned to his room with his wife guarding. Provided supervision level handout to ensure carryover of education. Pt was left sitting up in the w/c with all needs met and call bell within reach.   Therapy Documentation Precautions:  Precautions Precautions: Fall Precaution Comments: fall risk secondary to funcitonal balance Restrictions Weight Bearing Restrictions: No  Therapy/Group: Individual Therapy  Crissie Reese 05/20/2023,  6:36 AM

## 2023-05-20 NOTE — Progress Notes (Signed)
Speech Language Pathology Daily Session Note  Patient Details  Name: CORNELL LUTMAN MRN: [id number] Date of Birth: [birth year]  Today's Date: 05/20/2023 SLP Individual Time: 0800-0830 SLP Individual Time Calculation (min): 30 min  Short Term Goals: Week 1: SLP Short Term Goal 1 (Week 1): STGs=LTGs due ELOS  Skilled Therapeutic Interventions:   Pt, spouse, and family member greeted at bedside. He was finishing up conversation w/ nurse upon SLP arrival and agreeable to functional tx tasks targeting cognition. Majority of tx session comprised of pt/family education given upcoming d/c this week. He was able to spontaneously recall 3/4 WRAP memory strategies, and independently utilized his written cue to recall 4/4. The pt and his wife demonstrated adequate awareness of current physical/cognitive limitations and verbalized understanding of SLP's education re remaining mild cognitive deficits, goal for pt to complete as many tasks independently as he can, slight assistance likely required for IADLs, and f/u OP ST. Additional questions were answered. He was left in bed with the alarm set and call light within reach. Recommend cont ST per POC.   Pain  No pain reported  Therapy/Group: Individual Therapy  [name] 05/20/2023, 9:14 AM

## 2023-05-20 NOTE — Progress Notes (Signed)
Manual bp 98/63. Per family patient is sleepy.MD notified. Continued to monitor.

## 2023-05-20 NOTE — Progress Notes (Signed)
Physical Therapy Session Note  Patient Details  Name: Alex Church MRN: [id number] Date of Birth: 09-10-1945  Today's Date: 05/20/2023 PT Individual Time: 1110-1207 PT Individual Time Calculation (min): 57 min   Short Term Goals: Week 1:  PT Short Term Goal 1 (Week 1): STG = LTG secondary to ELOS  Skilled Therapeutic Interventions/Progress Updates:    Pt received sitting in w/c with his wife, Alex Church, present for hands-on family education/training. Pt agreeable to therapy session. Pt's wife expresses concern that pt has his sleep/wake cycles reversed and is looking forward to getting pt on a routine at home. Pt's wife reports she is planning to return to work on Monday, 9/16, with her sister planning to provide pt supervision while she is at work for 24hr support.  Wife confirms there are 2 STE house from garage, no HRs, and the bedroom is upstairs with (~13steps) flight of stairs with B HRs (close enough to reach both). Wife confirms that pt already has RW.   Pt's wife frequently reaching for support from items while ambulating including using railing in hallways or counters; therefore, wife truly able to provide more of a supervision/distant supervision support for patient at D/C. This therapist feels this will be adequate based on pt's CLOF.   Therapist provided education/training on the following:  - proper use of gait belt  - recommendation for pt to use RW for all mobility  - follow-up OPPT - suggestion to obtain a 2nd RW to leave one on each level of the home  - fall risk education including: need to wear tennis shoes, night lights, pulling up rugs, etc.  - calling 911 in event of a fall - signs/symptoms of CVA (BE FAST) as pt may have increased risk for this due to brain surgery - educated on option of getting RW tray attachment to allow pt to carry items more independently  Pt performed sit<>stands using RW with supervision progressing towards mod-I during session. Pt ambulated  to/from gyms (ortho gym, main gym, and ADL apartment) using RW with supervision for safety throughout - no instances of LOB.   Stair navigation training ascending/descending 12 steps in gym (6" height) using B HRs with supervision for safety and pt performing reciprocal stepping pattern.  Discussed home entry option of using doorframe for support to navigate 1st step and then using B UE support on RW to navigate 2nd step in/out of the home.  Participated in 1 step navigation using RW (8" height) x2 reps (same technique as navigating a curb) to ensure pt safely uses RW to navigate a step for home entry - CGA progressed to close supervision/SBA with good strength in B LEs and good AD management.  Stair navigation training ascending/descending 11 steps in stairwell (6" height) using B HRs with CGA for safety - continues to perform reciprocal stepping pattern with good control; however, would benefit from additional training with this to prepare for the flight of stairs at home.  Discussed pt's wife/pt's goals of being able to be as independent as possible, including navigating and managing RW in kitchen to get food, drinks, and feed the dog. Educated on placing items pt needs at counter height for safe access. Demonstrated proper AD management when accessing drawers and cabinets. Pt demonstrated ability to collect 4 items from cabinets/drawers with SBA for safety and no LOB.  Pt/wife deny any additional questions/concerns at this time and both report feeling ready for D/C on 9/14. At end of session, pt left seated in w/c  with needs in reach, seat belt alarm on, and wife present.  Therapy Documentation Precautions:  Precautions Precautions: Fall Precaution Comments: fall risk secondary to funcitonal balance Restrictions Weight Bearing Restrictions: No   Pain:  No reports of pain throughout session.    Therapy/Group: Individual Therapy  Ginny Forth , PT, DPT, NCS, CSRS 05/20/2023, 8:02 AM

## 2023-05-20 NOTE — Progress Notes (Addendum)
PROGRESS NOTE   Subjective/Complaints: UTI symptoms improved today. No new concerns this AM.   ROS: Patient denies fever, rash, sore throat, blurred vision, dizziness, nausea, vomiting, diarrhea, cough, shortness of breath or chest pain, joint or back/neck pain, headache, or mood change.  + Dysuria-improved  Objective:   No results found. Recent Labs    05/18/23 0701  WBC 24.5*  HGB 11.9*  HCT 36.7*  PLT 177   Recent Labs    05/19/23 0538 05/20/23 0714  NA 131* 131*  K 4.6 4.2  CL 99 99  CO2 25 24  GLUCOSE 100* 172*  BUN 30* 27*  CREATININE 0.97 0.96  CALCIUM 8.2* 8.1*    Intake/Output Summary (Last 24 hours) at 05/20/2023 1037 Last data filed at 05/20/2023 0925 Gross per 24 hour  Intake 480 ml  Output 1200 ml  Net -720 ml        Physical Exam: Vital Signs Blood pressure 111/63, pulse 65, temperature 98.4 F (36.9 C), temperature source Oral, resp. rate 14, weight 86.1 kg, SpO2 97%.  Constitutional: No distress . Vital signs reviewed. HEENT: crani incision with staples- no signed of infection noted,  EOMI, oral membranes moist Neck: supple Cardiovascular: RRR without murmur. No JVD    Respiratory/Chest: CTA Bilaterally without wheezes or rales. Normal effort    GI/Abdomen: BS +, non-tender, non-distended Ext: no clubbing, cyanosis, or edema Psych: pleasant and cooperative  Skin: Clean and intact   Bruising noted left upper extremity and right neck-improving Neuro: Alert and awake, reasonable insight and awareness.  follows commands, cranial nerves II through XII grossly intact Moving all extremities to gravity and resistance Sensory exam normal for light touch and pain in all 4 limbs. No limb ataxia or cerebellar signs. No abnormal tone appreciated.    Musculoskeletal: No joint swelling or tenderness No hypertonia noted    Assessment/Plan: 1. Functional deficits which require 3+ hours per day of  interdisciplinary therapy in a comprehensive inpatient rehab setting. Physiatrist is providing close team supervision and 24 hour management of active medical problems listed below. Physiatrist and rehab team continue to assess barriers to discharge/monitor patient progress toward functional and medical goals  Care Tool:  Bathing    Body parts bathed by patient: Right arm, Left arm, Chest, Abdomen, Front perineal area, Buttocks, Right upper leg, Left upper leg, Right lower leg, Left lower leg, Face         Bathing assist Assist Level: Independent with assistive device Assistive Device Comment: RW with MinA for LB task performance   Upper Body Dressing/Undressing Upper body dressing   What is the patient wearing?: Pull over shirt    Upper body assist Assist Level: Set up assist    Lower Body Dressing/Undressing Lower body dressing      What is the patient wearing?: Underwear/pull up, Pants     Lower body assist Assist for lower body dressing: Minimal Assistance - Patient > 75%     Toileting Toileting    Toileting assist Assist for toileting: Independent with assistive device     Transfers Chair/bed transfer  Transfers assist     Chair/bed transfer assist level: Contact Guard/Touching assist  Locomotion Ambulation   Ambulation assist      Assist level: Contact Guard/Touching assist Assistive device: Walker-rolling Max distance: 180'   Walk 10 feet activity   Assist     Assist level: Contact Guard/Touching assist Assistive device: Walker-rolling   Walk 50 feet activity   Assist    Assist level: Contact Guard/Touching assist Assistive device: Walker-rolling    Walk 150 feet activity   Assist    Assist level: Contact Guard/Touching assist Assistive device: Walker-rolling    Walk 10 feet on uneven surface  activity   Assist     Assist level: Contact Guard/Touching assist Assistive device: Walker-rolling    Wheelchair     Assist Is the patient using a wheelchair?: Yes Type of Wheelchair: Manual (distance)    Wheelchair assist level: Supervision/Verbal cueing Max wheelchair distance: 150'    Wheelchair 50 feet with 2 turns activity    Assist        Assist Level: Supervision/Verbal cueing   Wheelchair 150 feet activity     Assist      Assist Level: Supervision/Verbal cueing   Blood pressure 111/63, pulse 65, temperature 98.4 F (36.9 C), temperature source Oral, resp. rate 14, weight 86.1 kg, SpO2 97%.  Medical Problem List and Plan: 1. 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 Jake Samples on 05/12/2023             -patient may shower, cover surgical incisions             -ELOS/Goals: 7-10 days, Mod I PT/OT/SLP             -Continue CIR therapies including PT, OT, and SLP   -Est DC 9/14   2.  Antithrombotics: -DVT/anticoagulation:  Pharmaceutical: Heparin started 9/05             -antiplatelet therapy: none   3. Pain Management: Tylenol, Norco as needed   4. Mood/Behavior/Sleep: LCSW to evaluate and provide emotional support             -will increase melatonin to 10mg  q HS  -rx likely UTI             -antipsychotic agents: n/a              5. Neuropsych/cognition: This patient is capable of making decisions on his own behalf.   6. Skin/Wound Care: Routine skin care checks             -monitor surgical incision   7. Fluids/Electrolytes/Nutrition: Routine Is and Os and follow-up chemistries             -adjust to carb modified diet             -monitor PO intake (no current documentation)   8: Hypertension: monitor TID and prn, has been controlled overall             -continue carvedilol 12.5 mg Bid             -continue clonidine 0.2 mg BID             -continue lisinopril 20 mg BID  -9/7 change lisinopril to 10 mg daily due to soft BP  -9/10 BP soft,  discontinue lisinopril  -9/11 monitor response to medication change, BP was a little soft yesterday  Addendum decrease clonidiine to 0.1mg  BID  Decrease coreg to 6.25 mg BID     05/20/2023    5:15  AM 05/19/2023    8:31 PM 05/19/2023    4:41 PM  Vitals with BMI  Systolic 111 127 95  Diastolic 63 58 51  Pulse 65 68 69      9: Hyperlipidemia: continue Lipitor 10 mg   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 Jake Samples, follow-up with Dr. Barbaraann Cao   11: DM-2: CBGs QID; A1c = 6.9%, steroid induced hyperglycemia (home meds include Januvia 100 mg daily, metformin 1000 mg twice daily, Actos 30 mg daily>>not restarted)             -continue SSI             -continue Semglee 16 units daily  -9/7 increase Semglee to 18 units daily, may need to wean down as steroids are reduced  -9/8 Restart metformin at 500mg  BID  -9/11 Discussed with pharmacy regarding transitioning to oral medications,  Restart Januvia 100, decrease semglee to 14, start tradjenta  -Addendum adjusted insulin to 12 units CBG (last 3)  Recent Labs    05/19/23 1643 05/19/23 2146 05/20/23 0609  GLUCAP 167* 114* 136*      12: GI prophylaxis: continue Protonix BID while on steroids   13: Hyponatremia: Last sodium 130 9/6, follow-up BMP -NA 126, asymptomatic start salt tabs, fluid restriction, recheck labs later today, check urine osmolality and sodium -Decrease lisinopril dose to 10 mg daily -9/10 sodium a little lower today at 131, lisinopril was discontinued, he is on normal saline 50 cc an hour-will discontinue today.  Recheck BMP tomorrow -9/11 Na stable 131, increase salt tabs to TID, continue fluid restriction     Latest Ref Rng & Units 05/20/2023    7:14 AM 05/19/2023    5:38 AM 05/18/2023    7:01 AM  BMP  Glucose 70 - 99 mg/dL 098  119  147    BUN 8 - 23 mg/dL 27  30  26    Creatinine 0.61 - 1.24 mg/dL 8.29  5.62  1.30   Sodium 135 - 145 mmol/L 131  131  134   Potassium 3.5 - 5.1 mmol/L 4.2  4.6  3.9   Chloride 98 - 111 mmol/L 99  99  101   CO2 22 - 32 mmol/L 24  25  26    Calcium 8.9 - 10.3 mg/dL 8.1  8.2  8.0      14: Mild anemia: Hgb 11.5 9/6, follow-up CBC  -9/9 Hgb stable 11.9    14: Leukocytosis:               -9/9 WBC continues to be elevated 24.5 , monitor for signs of infection. Likely steroids    -ua,ucx- indicates UTI  15.  Acute metabolic encephalopathy: TSH/B12 stable.  Improving   16.  UTI  -UAUCX  -indicates UTI, will start Keflex 9/10 and monitor urine cultures  LOS: 5 days A FACE TO FACE EVALUATION WAS PERFORMED  Fanny Dance 05/20/2023, 10:37 AM

## 2023-05-20 NOTE — Progress Notes (Signed)
Nursing education done ; no further questions noted.

## 2023-05-20 NOTE — Progress Notes (Signed)
Patient ID: Alex Church, male   DOB: June 07, 1946, 77 y.o.   MRN: [id number]  SW received updates from OT DME needed-3in1 BSC and shower chair. SW confirmed with pt and pt wife during OT family edu items will be needed.   SW faxed outpatient PT/OT/SLP referral to [facility] (p:[phone]/f:[phone]).  SW ordered 3in1 BSC and shoer chair with [employer] via parachute.   Cecile Sheerer, MSW, LCSWA Office: [phone] Cell: [phone] Fax: [phone]

## 2023-05-21 DIAGNOSIS — I10 Essential (primary) hypertension: Secondary | ICD-10-CM | POA: Diagnosis not present

## 2023-05-21 DIAGNOSIS — E871 Hypo-osmolality and hyponatremia: Secondary | ICD-10-CM | POA: Diagnosis not present

## 2023-05-21 DIAGNOSIS — D496 Neoplasm of unspecified behavior of brain: Secondary | ICD-10-CM | POA: Diagnosis not present

## 2023-05-21 DIAGNOSIS — E119 Type 2 diabetes mellitus without complications: Secondary | ICD-10-CM | POA: Diagnosis not present

## 2023-05-21 LAB — GLUCOSE, CAPILLARY
Glucose-Capillary: 122 mg/dL — ABNORMAL HIGH (ref 70–99)
Glucose-Capillary: 163 mg/dL — ABNORMAL HIGH (ref 70–99)
Glucose-Capillary: 162 mg/dL — ABNORMAL HIGH (ref 70–99)
Glucose-Capillary: 167 mg/dL — ABNORMAL HIGH (ref 70–99)

## 2023-05-21 MED ORDER — INSULIN GLARGINE-YFGN 100 UNIT/ML ~~LOC~~ SOLN
6.0000 [IU] | Freq: Every day | SUBCUTANEOUS | Status: DC
  Administered 2023-05-22 – 2023-05-23 (×2): 6 [IU] via SUBCUTANEOUS
  Filled 2023-05-21 (×2): qty 0.06

## 2023-05-21 MED ORDER — DEXAMETHASONE 2 MG PO TABS
2.0000 mg | ORAL_TABLET | Freq: Two times a day (BID) | ORAL | Status: DC
  Administered 2023-05-22 – 2023-05-23 (×2): 2 mg via ORAL
  Filled 2023-05-21 (×2): qty 1

## 2023-05-21 MED ORDER — DEXAMETHASONE 2 MG PO TABS
2.0000 mg | ORAL_TABLET | Freq: Three times a day (TID) | ORAL | Status: AC
  Administered 2023-05-21 – 2023-05-22 (×4): 2 mg via ORAL
  Filled 2023-05-21 (×4): qty 1

## 2023-05-21 NOTE — Progress Notes (Signed)
Occupational Therapy Session Note  Patient Details  Name: Alex Church MRN: [id number] Date of Birth: [birth year]  Today's Date: 05/21/2023 OT Individual Time: 1304-1401 OT Individual Time Calculation (min): 57 min    Short Term Goals: Week 1:  OT Short Term Goal 1 (Week 1): The pt will improve UB strength to 4/5MMT at 95% safe OT Short Term Goal 2 (Week 1): The pt will complete LB bathing and dressing with ModI  and 95% safe using AE as needed OT Short Term Goal 3 (Week 1): The pt will tolerate > 30 minutes of skilled OT with minimal rest breaks at 95% safe OT Short Term Goal 4 (Week 1): The pt will complete a simple meal with ModI at 95% safe during performance with rest breaks as needed. OT Short Term Goal 5 (Week 1): The pt will demonstrate good funcitonal balance during functional task performance at 95% safe .  Skilled Therapeutic Interventions/Progress Updates:    Pt greeted seated in wc and agreeable to OT treatment session. Pt brought to therapy apartment and had wife come with [place] to practice walk-in shower transfer in simulated home environment. OT had wife assist with side stepping over and using grab bar. Pt ambulated to therapy gym and worked on standing balance/endurance standing on foam mat while completing UB there-ex using 4 lb weighted bar. 3 sets of 10 chest press, straight arm raise, bicep curl, and upright row. Pt took seated rest breaks in between sets. Alternating toe taps with focus on balance and weight shifting. OT placed tape on floor to show pt where to return feet to.and increase base of support. Pt ambulated back to room w/ RW and close supervision. Pt left seated in wc with alarm belt on as dieticians entered room.   Therapy Documentation Precautions:  Precautions Precautions: Fall Precaution Comments: fall risk secondary to funcitonal balance Restrictions Weight Bearing Restrictions: No Pain: Denies pain   Therapy/Group: Individual Therapy  Mal Amabile 05/21/2023, 1:48 PM

## 2023-05-21 NOTE — Progress Notes (Signed)
Physical Therapy Session Note  Patient Details  Name: Alex Church MRN: [id number] Date of Birth: 04/01/1946  Today's Date: 05/21/2023 PT Individual Time: 1433-1530 PT Individual Time Calculation (min): 57 min   Short Term Goals: Week 1:  PT Short Term Goal 1 (Week 1): STG = LTG secondary to ELOS  Skilled Therapeutic Interventions/Progress Updates:     Pt received seated in [facility] and agrees to therapy. NO complaint of pain. WC transport to gym for time management. Pt performs sit to stand with CGA and cues for initiation. Pt ambulates x200' with primarily CGA and one instance of minA due to slight LOB to the Rt. Pt provides cues for upright gaze to improve posture and balance and increasing stride length to decrease risk for falls. Pt takes seated rest break, then ambulates x340' with similar assistance and cues, with 2 slights LOB requiring minA. Pt then attends dancing activity to promote aerobic activity with coordinated and upper and lower extremity movements, tasked with reproducing movements from instructor. PT provides cues for correct performance and feedback on accuracy. WC transport back to room. Left seated with alarm intact and all needs within reach.   Therapy Documentation Precautions:  Precautions Precautions: Fall Precaution Comments: fall risk secondary to funcitonal balance Restrictions Weight Bearing Restrictions: No  Therapy/Group: Individual Therapy  Beau Fanny, PT, DPT 05/21/2023, 4:28 PM

## 2023-05-21 NOTE — Progress Notes (Signed)
Patient ID: Alex Church, male   DOB: 1946-08-03, 77 y.o.   MRN: [id number]  SW received updates from PT that pt will need RW.   SW ordered RW with [employer] via parachute.   Cecile Sheerer, MSW, LCSWA Office: [phone] Cell: [phone] Fax: [phone]

## 2023-05-21 NOTE — Progress Notes (Addendum)
PROGRESS NOTE   Subjective/Complaints: UTI symptoms continue to be improved.  Discussed possibility of continued use of insulin after discharge.  ROS: Patient denies fever, rash, sore throat, blurred vision, dizziness, nausea, vomiting, diarrhea, cough, shortness of breath or chest pain, joint or back/neck pain, headache, or mood change.  Denies new motor or sensory changes. + Dysuria-improved  Objective:   No results found. No results for input(s): "WBC", "HGB", "HCT", "PLT" in the last 72 hours.  Recent Labs    05/19/23 0538 05/20/23 0714  NA 131* 131*  K 4.6 4.2  CL 99 99  CO2 25 24  GLUCOSE 100* 172*  BUN 30* 27*  CREATININE 0.97 0.96  CALCIUM 8.2* 8.1*    Intake/Output Summary (Last 24 hours) at 05/21/2023 0933 Last data filed at 05/21/2023 1610 Gross per 24 hour  Intake 480 ml  Output 1725 ml  Net -1245 ml        Physical Exam: Vital Signs Blood pressure 120/65, pulse 73, temperature 97.8 F (36.6 C), temperature source Oral, resp. rate 16, weight 86.1 kg, SpO2 96%.  Constitutional: No distress . Vital signs reviewed.  Lying in bed appears comfortable HEENT: crani incision with staples- no signed of infection noted, conjugate gaze, MMM membranes moist Neck: supple Cardiovascular: RRR without murmur. No JVD    Respiratory/Chest: CTA Bilaterally without wheezes or rales. Normal effort    GI/Abdomen: Soft, nontender, nondistended, normal active bowel sounds  Ext: no clubbing, cyanosis, or edema Psych: pleasant and cooperative  Skin: Clean and intact   Bruising noted left upper extremity and right neck-improving Neuro: Alert and awake, reasonable insight and awareness.  follows commands, cranial nerves II through XII grossly intact Moving all extremities to gravity and resistance Sensory exam normal for light touch and pain in all 4 limbs. No limb ataxia or cerebellar signs. No abnormal tone appreciated.     Musculoskeletal: No joint swelling or tenderness No hypertonia noted    Assessment/Plan: 1. Functional deficits which require 3+ hours per day of interdisciplinary therapy in a comprehensive inpatient rehab setting. Physiatrist is providing close team supervision and 24 hour management of active medical problems listed below. Physiatrist and rehab team continue to assess barriers to discharge/monitor patient progress toward functional and medical goals  Care Tool:  Bathing    Body parts bathed by patient: Right arm, Left arm, Chest, Abdomen, Front perineal area, Buttocks, Right upper leg, Left upper leg, Right lower leg, Left lower leg, Face         Bathing assist Assist Level: Independent with assistive device Assistive Device Comment: RW with MinA for LB task performance   Upper Body Dressing/Undressing Upper body dressing   What is the patient wearing?: Pull over shirt    Upper body assist Assist Level: Set up assist    Lower Body Dressing/Undressing Lower body dressing      What is the patient wearing?: Underwear/pull up, Pants     Lower body assist Assist for lower body dressing: Minimal Assistance - Patient > 75%     Toileting Toileting    Toileting assist Assist for toileting: Independent with assistive device     Transfers Chair/bed transfer  Transfers  assist     Chair/bed transfer assist level: Contact Guard/Touching assist     Locomotion Ambulation   Ambulation assist      Assist level: Contact Guard/Touching assist Assistive device: Walker-rolling Max distance: 180'   Walk 10 feet activity   Assist     Assist level: Contact Guard/Touching assist Assistive device: Walker-rolling   Walk 50 feet activity   Assist    Assist level: Contact Guard/Touching assist Assistive device: Walker-rolling    Walk 150 feet activity   Assist    Assist level: Contact Guard/Touching assist Assistive device: Walker-rolling    Walk  10 feet on uneven surface  activity   Assist     Assist level: Contact Guard/Touching assist Assistive device: Walker-rolling   Wheelchair     Assist Is the patient using a wheelchair?: Yes Type of Wheelchair: Manual (distance)    Wheelchair assist level: Supervision/Verbal cueing Max wheelchair distance: 150'    Wheelchair 50 feet with 2 turns activity    Assist        Assist Level: Supervision/Verbal cueing   Wheelchair 150 feet activity     Assist      Assist Level: Supervision/Verbal cueing   Blood pressure 120/65, pulse 73, temperature 97.8 F (36.6 C), temperature source Oral, resp. rate 16, weight 86.1 kg, SpO2 96%.  Medical Problem List and Plan: 1. 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 Jake Samples on 05/12/2023             -patient may shower, cover surgical incisions             -ELOS/Goals: 7-10 days, Mod I PT/OT/SLP             -Continue CIR therapies including PT, OT, and SLP   -Est DC 9/14   2.  Antithrombotics: -DVT/anticoagulation:  Pharmaceutical: Heparin started 9/05             -antiplatelet therapy: none   3. Pain Management: Tylenol, Norco as needed   4. Mood/Behavior/Sleep: LCSW to evaluate and provide emotional support             -will increase melatonin to 10mg  q HS  -rx likely UTI             -antipsychotic agents: n/a              5. Neuropsych/cognition: This patient is capable of making decisions on his own behalf.   6. Skin/Wound Care: Routine skin care checks             -monitor surgical incision   7. Fluids/Electrolytes/Nutrition: Routine Is and Os and follow-up chemistries             -adjust to carb modified diet             -monitor PO intake (no current documentation)   8: Hypertension: monitor TID and prn, has been controlled overall             -continue carvedilol 12.5 mg Bid             -continue clonidine 0.2  mg BID             -continue lisinopril 20 mg BID  -9/7 change lisinopril to 10 mg daily due to soft BP  -9/10 BP soft, discontinue lisinopril  -9/11 monitor response to medication change, BP was a little soft yesterday  Addendum decrease clonidiine to 0.1mg  BID  Decrease coreg to 6.25 mg BID -9/12 BP appears better controlled, continue to monitor response to medication changes     05/21/2023    4:41 AM 05/20/2023    8:00 PM 05/20/2023    5:56 PM  Vitals with BMI  Systolic 120 123 161  Diastolic 65 66 61  Pulse 73 75 74      9: Hyperlipidemia: continue Lipitor 10 mg   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 Jake Samples, follow-up with Dr. Barbaraann Cao   11: DM-2: CBGs QID; A1c = 6.9%, steroid induced hyperglycemia (home meds include Januvia 100 mg daily, metformin 1000 mg twice daily, Actos 30 mg daily>>not restarted)             -continue SSI             -continue Semglee 16 units daily  -9/7 increase Semglee to 18 units daily, may need to wean down as steroids are reduced  -9/8 Restart metformin at 500mg  BID  -9/11 Discussed with pharmacy regarding transitioning to oral medications,  Restart Januvia 100, decrease semglee to 14, start tradjenta  -Addendum adjusted insulin to 12 units  9/12 discussed restart of oral medications, insulin was decreased today, will see how he does on this lower dose although may need short-term use of insulin after discharge. Decrease semglee to 6units   CBG (last 3)  Recent Labs    05/20/23 1633 05/20/23 2100 05/21/23 0622  GLUCAP 210* 254* 122*      12: GI prophylaxis: continue Protonix BID while on steroids   13: Hyponatremia: Last sodium 130 9/6, follow-up BMP -NA 126, asymptomatic start salt tabs, fluid restriction, recheck labs later today, check urine osmolality and  sodium -Decrease lisinopril dose to 10 mg daily -9/10 sodium a little lower today at 131, lisinopril was discontinued, he is on normal saline 50 cc an hour-will discontinue today.  Recheck BMP tomorrow -9/11 Na stable 131, increase salt tabs to TID, continue fluid restriction  -9/12 recheck labs tomorrow     Latest Ref Rng & Units 05/20/2023    7:14 AM 05/19/2023    5:38 AM 05/18/2023    7:01 AM  BMP  Glucose 70 - 99 mg/dL 096  045  409   BUN 8 - 23 mg/dL 27  30  26    Creatinine 0.61 - 1.24 mg/dL 8.11  9.14  7.82   Sodium 135 - 145 mmol/L 131  131  134   Potassium 3.5 - 5.1 mmol/L 4.2  4.6  3.9   Chloride 98 - 111 mmol/L 99  99  101   CO2 22 - 32 mmol/L 24  25  26    Calcium 8.9 - 10.3 mg/dL 8.1  8.2  8.0      14: Mild anemia: Hgb 11.5 9/6, follow-up CBC  -9/9 Hgb stable 11.9   Recheck tomorrow   14: Leukocytosis:               -9/9 WBC continues to be elevated 24.5 , monitor for signs of infection. Likely steroids    -ua,ucx- indicates UTI  15.  Acute metabolic encephalopathy: TSH/B12 stable.  Improving   16.  UTI  -  Zannie Kehr  -indicates UTI, will start Keflex 9/10 and monitor urine cultures  -9/12 symptoms remain improved  LOS: 6 days A FACE TO FACE EVALUATION WAS PERFORMED  Fanny Dance 05/21/2023, 9:33 AM

## 2023-05-21 NOTE — Progress Notes (Addendum)
Met with patient and family in room. Was checking blood sugar at home. Continues with oral medications.  Explained that nursing will be going over insulin administration (provided video) and will most likely need to increase the amount of times he will check blood sugar to AC/HS.  Provided handouts with record sheet for blood sugars, using an insuline pen, diet information.  Bedside nurse in room and not sure if going home on insulin or not but providing education as still on semglee.  Updated education in [facility] education.  Added RD and diabetes educator orders yesterday.  Patient and wife verbalized an understanding.  Updated education to worklist. All needs met, call bell in reach.

## 2023-05-21 NOTE — Progress Notes (Signed)
Initial Nutrition Assessment  DOCUMENTATION CODES:   Not applicable  INTERVENTION:  Snack in afternoon. Diabetes education with hand out.   NUTRITION DIAGNOSIS:   Increased nutrient needs related to cancer and cancer related treatments as evidenced by estimated needs.    GOAL:   Patient will meet greater than or equal to 90% of their needs    MONITOR:   PO intake, Labs, Weight trends  REASON FOR ASSESSMENT:   Consult Diet education  ASSESSMENT:   Craniotomy 9/3  Pmhx; DM2, Acute encephalopathy, HLD, HTN, Left side weakness, Neoplasm brain. RD consulted for education. Pt just returning from therapy at time of visit. Wife in room. Pt stated that UBW was 190-195#. No appetite changes noted at this time. Stated food was good all though he felt as though it was too much and is able to eat about 50% of his meals.   Home intake No breakfast, sandwich ~ 10:00am with chips, snack after work, and dinner of meat and vegetables. Enjoys fruit and banana sandwich.  Wife and pt goal is for him to return home without being insulin dependent and plans for Chemo. Pt does most the cooking at home.  DM education provided along with hand out. Discussed snack midday to help meet increased needs pt agreeable.    Meds; Novolog, Keppra, Tradjenta, metFormin, Protonix, sodium chloride tablets Labs: Na:131, Gluc;172, Hgb A1C; 6.9(8/28)    NUTRITION - FOCUSED PHYSICAL EXAM:  Flowsheet Row Most Recent Value  Orbital Region No depletion  Upper Arm Region Mild depletion  Thoracic and Lumbar Region No depletion  Buccal Region Mild depletion  Temple Region Mild depletion  Clavicle Bone Region No depletion  Clavicle and Acromion Bone Region No depletion  Scapular Bone Region Mild depletion  Dorsal Hand Mild depletion  Patellar Region No depletion  Anterior Thigh Region No depletion  Posterior Calf Region No depletion  Edema (RD Assessment) Unable to assess  Hair Reviewed  Eyes Reviewed   Mouth Reviewed  Skin Reviewed  Nails Reviewed       Diet Order:   Diet Order             Diet Carb Modified Fluid consistency: Thin; Room service appropriate? Yes; Fluid restriction: 1200 mL Fluid  Diet effective now                   EDUCATION NEEDS:   Education needs have been addressed  Skin:  Skin Assessment: Reviewed RN Assessment  Last BM:  9/11  Height:   Ht Readings from Last 1 Encounters:  05/05/23 6' (1.829 m)    Weight:   Wt Readings from Last 1 Encounters:  05/15/23 86.1 kg    Ideal Body Weight:  80.5 kg  BMI:  Body mass index is 25.76 kg/m.  Estimated Nutritional Needs:   Kcal:  2000-2500kcal (MSJ)  Protein:  86-104g (g/kg)  Fluid:  2000-2535ml (ml/kcal) (or per team)    Ricardo Jericho, RDN, LDN

## 2023-05-21 NOTE — Progress Notes (Signed)
Physical Therapy Session Note  Patient Details  Name: Alex Church MRN: [id number] Date of Birth: 11/01/1945  Today's Date: 05/21/2023 PT Individual Time: 1030-1100 PT Individual Time Calculation (min): 30 min   Short Term Goals: Week 1:  PT Short Term Goal 1 (Week 1): STG = LTG secondary to ELOS  Skilled Therapeutic Interventions/Progress Updates: Pt presented in bed with wife and SIL present agreeable to therapy. Pt denies pain at rest. Session focused on functional mobility and endurance in preparation for d/c. Pt completed bed mobility with supervision and use of bed features. Pt donned shoes with set up and RW provided. Pt stood with supervision and demonstrated appropriate hand placement. Ambulated with supervision to main gym and completed ascending/descending x 12 steps with B rails to simulate home environment. During seated rest as therapist was cleaning rails pt indicated need for urinary void. Pt stood with supervision and ambulated back to room with supervision. In bathroom pt completed urinary void in standing with therapist noting that pt with some incontinence which pt indicated due to UTI. Pt then transitioned to sitting on toilet to remove brief, pants, socks with distant supervision. Pt provided with wet washcloth and was able to complete peri-care and don clean clothes with supervision. Pt then requesting to don socks/shoes at bedside. PTA donned grip socks for time management and ambulated to bed with supervision. EOB pt donned socks/shoes with set up assist. Pt then ambulated ~5 min without rest break throughout unit for increased endurance. Upon return to room pt left in recliner at end of session with belt alarm on, call bell within reach and needs met.      Therapy Documentation Precautions:  Precautions Precautions: Fall Precaution Comments: fall risk secondary to funcitonal balance Restrictions Weight Bearing Restrictions: No   Therapy/Group: Individual  Therapy  Jaslen Adcox 05/21/2023, 12:20 PM

## 2023-05-21 NOTE — Progress Notes (Signed)
Speech Language Pathology Daily Session Note  Patient Details  Name: MARLON DEGRAAF MRN: [id number] Date of Birth: 1945-12-06  Today's Date: 05/21/2023 SLP Individual Time: 0915-1015 SLP Individual Time Calculation (min): 60 min  Short Term Goals: Week 1: SLP Short Term Goal 1 (Week 1): STGs=LTGs due ELOS  Skilled Therapeutic Interventions: Skilled treatment session focused on cognitive goals. SLP facilitated session by providing overall Supervision-Min A verbal cues for recall of his current medications and their functions. Patient organized a QID pill box with overall Min A verbal cues for problem solving due to it being a novel task. Overall supervision level verbal cues were needed to self-monitor and correct errors. Patient's wife present and educated regarding the importance of patient staying cognitively engaged at home and performing iADLS with assistance to ensure accuracy. She verbalized understanding. Patient left upright in bed with alarm on and all needs within reach. Continue with current plan of care.      Pain No/Denies Pain   Therapy/Group: Individual Therapy  Ivis Henneman 05/21/2023, 12:35 PM

## 2023-05-22 ENCOUNTER — Other Ambulatory Visit (HOSPITAL_COMMUNITY): Payer: Self-pay

## 2023-05-22 DIAGNOSIS — E119 Type 2 diabetes mellitus without complications: Secondary | ICD-10-CM | POA: Diagnosis not present

## 2023-05-22 DIAGNOSIS — E871 Hypo-osmolality and hyponatremia: Secondary | ICD-10-CM | POA: Diagnosis not present

## 2023-05-22 DIAGNOSIS — I10 Essential (primary) hypertension: Secondary | ICD-10-CM | POA: Diagnosis not present

## 2023-05-22 DIAGNOSIS — D496 Neoplasm of unspecified behavior of brain: Secondary | ICD-10-CM | POA: Diagnosis not present

## 2023-05-22 LAB — CBC
HCT: 40 % (ref 39.0–52.0)
Hemoglobin: 13 g/dL (ref 13.0–17.0)
MCH: 29.8 pg (ref 26.0–34.0)
MCHC: 32.5 g/dL (ref 30.0–36.0)
MCV: 91.7 fL (ref 80.0–100.0)
Platelets: 215 10*3/uL (ref 150–400)
RBC: 4.36 MIL/uL (ref 4.22–5.81)
RDW: 14.5 % (ref 11.5–15.5)
WBC: 19.1 10*3/uL — ABNORMAL HIGH (ref 4.0–10.5)
nRBC: 0 % (ref 0.0–0.2)

## 2023-05-22 LAB — BASIC METABOLIC PANEL
Anion gap: 12 (ref 5–15)
BUN: 29 mg/dL — ABNORMAL HIGH (ref 8–23)
CO2: 20 mmol/L — ABNORMAL LOW (ref 22–32)
Calcium: 8.7 mg/dL — ABNORMAL LOW (ref 8.9–10.3)
Chloride: 100 mmol/L (ref 98–111)
Creatinine, Ser: 1.18 mg/dL (ref 0.61–1.24)
GFR, Estimated: >60 mL/min (ref >60)
Glucose, Bld: 158 mg/dL — ABNORMAL HIGH (ref 70–99)
Potassium: 4.9 mmol/L (ref 3.5–5.1)
Sodium: 132 mmol/L — ABNORMAL LOW (ref 135–145)

## 2023-05-22 LAB — GLUCOSE, CAPILLARY
Glucose-Capillary: 163 mg/dL — ABNORMAL HIGH (ref 70–99)
Glucose-Capillary: 148 mg/dL — ABNORMAL HIGH (ref 70–99)
Glucose-Capillary: 187 mg/dL — ABNORMAL HIGH (ref 70–99)
Glucose-Capillary: 98 mg/dL (ref 70–99)

## 2023-05-22 MED ORDER — SODIUM CHLORIDE 1 G PO TABS
1.0000 g | ORAL_TABLET | Freq: Three times a day (TID) | ORAL | 0 refills | Status: DC
Start: 2023-05-22 — End: 2023-11-05
  Filled 2023-05-22: qty 90, 30d supply, fill #0

## 2023-05-22 MED ORDER — PIOGLITAZONE HCL 30 MG PO TABS: 30.0000 mg | ORAL_TABLET | Freq: Every day | ORAL | Status: DC

## 2023-05-22 MED ORDER — CLONIDINE HCL 0.1 MG PO TABS
0.1000 mg | ORAL_TABLET | Freq: Two times a day (BID) | ORAL | 0 refills | Status: DC
  Filled 2023-05-22: qty 60, 30d supply, fill #0

## 2023-05-22 MED ORDER — ACETAMINOPHEN 325 MG PO TABS: 325.0000 mg | ORAL_TABLET | ORAL | Status: DC | PRN

## 2023-05-22 MED ORDER — LEVETIRACETAM 500 MG PO TABS
500.0000 mg | ORAL_TABLET | Freq: Two times a day (BID) | ORAL | 0 refills | Status: DC
  Filled 2023-05-22: qty 60, 30d supply, fill #0

## 2023-05-22 MED ORDER — CEPHALEXIN 250 MG PO CAPS
250.0000 mg | ORAL_CAPSULE | Freq: Four times a day (QID) | ORAL | 0 refills | Status: DC
  Filled 2023-05-22: qty 5, 2d supply, fill #0

## 2023-05-22 MED ORDER — SITAGLIPTIN PHOSPHATE 100 MG PO TABS: 100.0000 mg | ORAL_TABLET | Freq: Every day | ORAL | Status: DC

## 2023-05-22 MED ORDER — INSULIN GLARGINE 100 UNIT/ML SOLOSTAR PEN
6.0000 [IU] | PEN_INJECTOR | Freq: Every day | SUBCUTANEOUS | 0 refills | Status: AC
Start: 2023-05-22 — End: ?
  Filled 2023-05-22: qty 9, 84d supply, fill #0

## 2023-05-22 MED ORDER — METFORMIN HCL 1000 MG PO TABS: 500.0000 mg | ORAL_TABLET | Freq: Two times a day (BID) | ORAL | Status: DC

## 2023-05-22 MED ORDER — AUM PEN NEEDLE 32G X 4 MM MISC
1.0000 | Freq: Three times a day (TID) | 0 refills | Status: DC
  Filled 2023-05-22: qty 100, 25d supply, fill #0

## 2023-05-22 MED ORDER — DEXAMETHASONE 2 MG PO TABS
2.0000 mg | ORAL_TABLET | Freq: Two times a day (BID) | ORAL | 0 refills | Status: DC
  Filled 2023-05-22: qty 60, 30d supply, fill #0

## 2023-05-22 MED ORDER — CARVEDILOL 12.5 MG PO TABS: 6.2500 mg | ORAL_TABLET | Freq: Two times a day (BID) | ORAL | Status: DC

## 2023-05-22 MED ORDER — INSULIN GLARGINE 100 UNIT/ML SOLOSTAR PEN
6.0000 [IU] | PEN_INJECTOR | Freq: Every day | SUBCUTANEOUS | 0 refills | Status: DC
  Filled 2023-05-22: qty 3, 28d supply, fill #0

## 2023-05-22 MED ORDER — PANTOPRAZOLE SODIUM 40 MG PO TBEC
40.0000 mg | DELAYED_RELEASE_TABLET | Freq: Two times a day (BID) | ORAL | 0 refills | Status: DC
  Filled 2023-05-22: qty 60, 30d supply, fill #0

## 2023-05-22 MED ORDER — INSULIN LISPRO (1 UNIT DIAL) 100 UNIT/ML (KWIKPEN)
PEN_INJECTOR | SUBCUTANEOUS | 11 refills | Status: DC
Start: 2023-05-22 — End: 2023-05-22
  Filled 2023-05-22: qty 9, 25d supply, fill #0

## 2023-05-22 NOTE — Progress Notes (Signed)
Speech Language Pathology Discharge Summary  Patient Details  Name: Alex Church MRN: [id number] Date of Birth: Jun 23, 1946  Date of Discharge from SLP service:May 22, 2023  Today's Date: 05/22/2023 SLP Individual Time: 1100-1156 SLP Individual Time Calculation (min): 56 min   Skilled Therapeutic Interventions:  Skilled treatment session focused on cognitive goals. SLP facilitated session by providing overall Mod I for problem solving and organization during complex scheduling and calendar tasks. SLP also completed education with the patient and his wife in regards to strategies to utilize at home to maximize recall and carryover of daily information and overall safety. Both verbalized understanding of all information. [name] Continue with current plan of care.   Patient has met 3 of 3 long term goals.  Patient to discharge at overall Modified Independent level.   Reasons goals not met: N/A   Clinical Impression/Discharge Summary: Patient has made excellent gains and has met 3 of 3 LTGs this admission. Currently, patient is overall Mod I to complete functional, familiar, and mildly complex tasks safely in regards to problem solving, recall with use of strategies, and selective attention. Patient and family education is complete and patient will discharge home with 24 hour supervision from family. Patient would benefit from f/u SLP services to maximize his cognitive functioning and overall functional independence in order to reduce caregiver burden.   Care Partner:  Caregiver Able to Provide Assistance: Yes  Type of Caregiver Assistance: Cognitive  Recommendation:  24 hour supervision/assistance;Outpatient SLP  Rationale for SLP Follow Up: Reduce caregiver burden;Maximize cognitive function and independence   Equipment: N/A   Reasons for discharge: Discharged from hospital;Treatment goals met   Patient/Family Agrees with Progress Made  and Goals Achieved: Yes    [name] 05/22/2023, 6:21 AM

## 2023-05-22 NOTE — Progress Notes (Signed)
Physical Therapy Discharge Summary  Patient Details  Name: Alex Church MRN: [id number] Date of Birth: 10/21/1945  Date of Discharge from PT service:May 22, 2023  Today's Date: 05/22/2023 PT Individual Time: 1324-4010 PT Individual Time Calculation (min): 72 min    Patient has met 9 of 9 long term goals due to improved activity tolerance, improved balance, improved postural control, increased strength, improved attention, improved awareness, and improved coordination.  Patient to discharge at an ambulatory level Modified Independent/supervision.   Patient's care partner is independent to provide the necessary physical and cognitive assistance at discharge.  Reasons goals not met: N/A  Recommendation:  Patient will benefit from ongoing skilled PT services in outpatient setting to continue to advance safe functional mobility, address ongoing impairments in dynamic postural stability, BLE coordination, single limb stability, and minimize fall risk.  Equipment: RW  Reasons for discharge: treatment goals met and discharge from hospital  Patient/family agrees with progress made and goals achieved: Yes   Skilled Treatment Interventions: Pt presents in room in [facility], asleep but awakens easily. Pt denies pain. Session focused on gait training with RW and for stair negotiation, NMR for assessment of falls risk, therapeutic exercise with prescription of HEP, and therapeutic activity for training funcitonal mobility.  Pt self propels WC modI to main gym as warm up prior to upright mobility. Pt ambulates to stairs modI with RW, navigates up/down 12 steps with BHRs supervision. Pt then ambulates to ortho gym modI, cues for direction only. Pt completes car transfer with set up assist for management of RW. Pt completes up ramp, over mulch and down curb with RW supervision only for navigating uneven terrain.  Pt then ambulates back to main gym for seated rest break. Pt then completes up/down 8  steps with RHR only, max verbal cues due to pt attempting to use LHR as well, completes with CGA. Pt then completes up/down 1 step without HR with CGA and max cues to prevent use of BUEs on HR. Pt returns to seat for seated rest break.  Pt then completes BERG 46/56 indicating moderate falls risk. Pt educated on results of test, ongoing falls risk and need to use RW at DC with pt verbalizing understanding and agreeable. Pt then ambulates 385' modI throughout unit, completed in busy hallways navigating obstacles.  Pt then provided with HEP with pt completing one set of the following exercises: Access Code: [facility] URL: [email or web address] Date: 05/22/2023 Prepared by: Edwin Cap  Exercises - Standing Marching  - 1 x daily - 7 x weekly - 2 sets - 20 reps - Mini Squat with Counter Support  - 1 x daily - 7 x weekly - 2 sets - 10 reps - Standing Heel Raises  - 1 x daily - 7 x weekly - 2 sets - 20 reps - Standing Knee Flexion Strengthening at Chair  - 1 x daily - 7 x weekly - 2 sets - 10 reps  Pt self propels back to room modI in WC. Pt remains seated in WC with all needs within reach, cal light in place and chair alarm donned and activated at end of session.   PT Discharge Precautions/Restrictions Precautions Precautions: Fall Restrictions Weight Bearing Restrictions: No Pain Interference Pain Interference Pain Effect on Sleep: 0. Does not apply - I have not had any pain or hurting in the past 5 days Pain Interference with Therapy Activities: 0. Does not apply - I have not received rehabilitationtherapy in the past 5 days Pain Interference with Day-to-Day  Activities: 1. Rarely or not at all Cognition Overall Cognitive Status: Impaired/Different from baseline Arousal/Alertness: Awake/alert Orientation Level: Oriented X4 Year: 2024 Month: September Day of Week: Correct Attention: Sustained Focused Attention: Appears intact Sustained Attention: Appears  intact Selective Attention: Impaired Memory: Impaired Awareness: Appears intact Problem Solving: Impaired Reasoning: Appears intact Safety/Judgment: Appears intact Sensation Sensation Light Touch: Appears Intact Coordination Gross Motor Movements are Fluid and Coordinated: No Coordination and Movement Description: decreased smoothness and accuracy noted with functional mobility Motor  Motor Motor: Within Functional Limits Motor - Discharge Observations: improved coordination from evaluation  Mobility Bed Mobility Bed Mobility: Rolling Right;Right Sidelying to Sit;Sit to Supine;Supine to Sit Rolling Right: Independent Rolling Left: Independent Supine to Sit: Independent Sit to Supine: Independent Transfers Transfers: Sit to Stand;Stand to [employer] Transfers Sit to Stand: Independent with assistive device Stand to Sit: Independent with assistive device Stand Pivot Transfers: Independent with assistive device Transfer (Assistive device): Rolling walker Locomotion  Gait Ambulation: Yes Gait Assistance: Supervision/Verbal cueing;Independent with assistive device Gait Distance (Feet): 385 Feet Assistive device: Rolling walker Gait Gait: Yes Gait Pattern: Within Functional Limits Gait velocity: decreased Stairs / Additional Locomotion Stairs: Yes Stairs Assistance: Maximal Assistance - Patient 25 - 49% Stair Management Technique: Two rails Number of Stairs: 12 Height of Stairs: 6 Ramp: Supervision/Verbal cueing Curb: Supervision/Verbal cueing Pick up small object from the floor assist level: Supervision/Verbal cueing Wheelchair Mobility Wheelchair Mobility: Yes Wheelchair Assistance: Independent with assistive device Wheelchair Propulsion: Both upper extremities Distance: 150'  Trunk/Postural Assessment  Cervical Assessment Cervical Assessment: Within Functional Limits Thoracic Assessment Thoracic Assessment: Within Functional Limits Lumbar  Assessment Lumbar Assessment: Within Functional Limits Postural Control Postural Control: Deficits on evaluation Righting Reactions: decreased Protective Responses: decreased Postural Limitations: decreased  Balance Balance Balance Assessed: Yes Standardized Balance Assessment Standardized Balance Assessment: Berg Balance Test Berg Balance Test Sit to Stand: Able to stand without using hands and stabilize independently Standing Unsupported: Able to stand safely 2 minutes Sitting with Back Unsupported but Feet Supported on Floor or Stool: Able to sit safely and securely 2 minutes Stand to Sit: Sits safely with minimal use of hands Transfers: Able to transfer safely, minor use of hands Standing Unsupported with Eyes Closed: Able to stand 10 seconds safely Standing Ubsupported with Feet Together: Able to place feet together independently and stand 1 minute safely From Standing, Reach Forward with Outstretched Arm: Can reach forward >12 cm safely (5") From Standing Position, Pick up Object from Floor: Able to pick up shoe, needs supervision From Standing Position, Turn to Look Behind Over each Shoulder: Looks behind from both sides and weight shifts well Turn 360 Degrees: Able to turn 360 degrees safely but slowly Standing Unsupported, Alternately Place Feet on Step/Stool: Able to complete 4 steps without aid or supervision Standing Unsupported, One Foot in Front: Able to plae foot ahead of the other independently and hold 30 seconds Standing on One Leg: Tries to lift leg/unable to hold 3 seconds but remains standing independently Total Score: 46 Static Standing Balance Static Standing - Balance Support: Bilateral upper extremity supported Static Standing - Level of Assistance: 6: Modified independent (Device/Increase time) Dynamic Standing Balance Dynamic Standing - Balance Support: During functional activity;No upper extremity supported Dynamic Standing - Level of Assistance: 6:  Modified independent (Device/Increase time) Extremity Assessment  RLE Assessment RLE Assessment: Within Functional Limits General Strength Comments: grossly 5/5 LLE Assessment LLE Assessment: Within Functional Limits General Strength Comments: grossly 5/5   Edwin Cap PT, DPT 05/22/2023, 3:49  PM

## 2023-05-22 NOTE — Progress Notes (Signed)
Occupational Therapy Discharge Summary  Patient Details  Name: Alex Church MRN: [id number] Date of Birth: 1946/04/22  Date of Discharge from OT service:May 22, 2023  Today's Date: 05/22/2023 OT Individual Time: 0850-1000 OT Individual Time Calculation (min): 70 min   Pt greeted seated in wc and agreeable to OT treatment session. BADL tasks completed at overall supervision/mod I level. Pt completed UB there-ex using 4 lb weighted bar in standing 3 sets of 10 chest press and bicep curl. Strength and endurance using NuStep on level 5 for 10 minutes. Pt ambulated to dayroom w/ RW and mod I. Obstacle course with cones without device weaving in and out of cones with mostly CGA but intermittent min A for occasional LOB. Practiced stepping over cones on L side. Pt ambulated back to room and left seated in wc with alarm belt on, family present, and needs met.   Patient has met 8 of 8 long term goals due to improved activity tolerance, improved balance, postural control, ability to compensate for deficits, improved attention, improved awareness, and improved coordination.  Patient to discharge at overall mod I/Supervision level.  Patient's care partner is independent to provide the necessary physical and cognitive assistance at discharge.    Reasons goals not met: n/a  Recommendation:  Patient will benefit from ongoing skilled OT services in outpatient setting to continue to advance functional skills in the area of BADL, iADL, and Reduce care partner burden.  Equipment: RW, shower chair  Reasons for discharge: treatment goals met and discharge from hospital  Patient/family agrees with progress made and goals achieved: Yes  OT Discharge Precautions/Restrictions  Precautions Precautions: Fall Restrictions Weight Bearing Restrictions: No= Pain  Denies pain ADL ADL Equipment Provided: Other (comment) (RW) Eating: Independent Where Assessed-Eating:  (based on funcitonal status during  BADL related to bathing/dressing) Grooming: Independent Upper Body Bathing: Supervision/safety Where Assessed-Upper Body Bathing: Sitting at sink Lower Body Bathing: Supervision/safety Where Assessed-Lower Body Bathing: Sitting at sink Upper Body Dressing: Independent Where Assessed-Upper Body Dressing: Sitting at sink Lower Body Dressing: Modified independent Where Assessed-Lower Body Dressing: Sitting at sink Toileting: Supervision/safety Where Assessed-Toileting: Teacher, adult education: Distant supervision Statistician Method: Proofreader: Engineer, technical sales: Distant supervision Tub/Shower Transfer Method: Ambulating (based on perfomance during bathing and dressing) Tub/Shower Equipment: Shower seat with back (would benefit from for [employer] safety) Film/video editor: Modified independent, Contact guard (grab bars) Film/video editor Method: Ambulating (RW) Astronomer: Information systems manager with back (would benefit from for work over time.) Financial risk analyst Overall Cognitive Status: Impaired/Different from baseline Arousal/Alertness: Awake/alert Orientation Level: Person;Place;Situation Person: Oriented Place: Oriented Situation: Oriented Memory: Impaired Safety/Judgment: Appears intact Brief Interview for Mental Status (BIMS) Repetition of Three Words (First Attempt): 3 Temporal Orientation: Year: Correct Temporal Orientation: Month: Accurate within 5 days Temporal Orientation: Day: Correct Recall: "Sock": Yes, no cue required Recall: "Blue": Yes, no cue required Recall: "Bed": Yes, no cue required BIMS Summary Score: 15 Sensation Sensation Light Touch: Appears Intact Coordination Gross Motor Movements are Fluid and Coordinated: No Fine Motor Movements are Fluid and Coordinated: No Motor  Motor Motor - Discharge Observations: improved coordination and generalized strength Mobility  Bed Mobility Bed  Mobility: Rolling Right;Right Sidelying to Sit;Sit to Supine;Supine to Sit Rolling Right: Independent Rolling Left: Independent Supine to Sit: Independent Sit to Supine: Independent Transfers Sit to Stand: Independent with assistive device Stand to Sit: Independent with assistive device  Balance Balance Balance Assessed: Yes Static Standing Balance Static Standing - Level  of Assistance: 6: Modified independent (Device/Increase time) Dynamic Standing Balance Dynamic Standing - Level of Assistance: 6: Modified independent (Device/Increase time) Extremity/Trunk Assessment RUE Assessment RUE Assessment: Within Functional Limits LUE Assessment LUE Assessment: Within Functional Limits   Merlene Laughter Tawania Daponte 05/22/2023, 9:22 AM

## 2023-05-22 NOTE — Inpatient Diabetes Management (Signed)
Inpatient Diabetes Program Recommendations  AACE/ADA: New Consensus Statement on Inpatient Glycemic Control (2015)  Target Ranges:  Prepandial:   less than 140 mg/dL      Peak postprandial:   less than 180 mg/dL (1-2 hours)      Critically ill patients:  140 - 180 mg/dL   Lab Results  Component Value Date   GLUCAP 163 (H) 05/22/2023   HGBA1C 6.9 (H) 05/06/2023    Review of Glycemic Control  Diabetes history: DM2 Outpatient Diabetes medications: Semglee 16 every day, Decadron taper Current orders for Inpatient glycemic control: Semglee 6 every day, tradjenta 5 mg every day, Actos 30 daily, metformin 500 mg BID, Novolog 0-9 units TID with meals and 0-5 HS  HgbA1C - 6.9% Decadron 2 mg Q12H CBGs on 9/12: 122, 163, 162, 167 mg/dL CBGs today:   Inpatient Diabetes Program Recommendations:    Spoke with pt and wife at bedside yesterday afternoon. Educated patient and spouse on insulin pen use at home. Reviewed all steps if insulin pen including attachment of needle, 2-unit air shot, dialing up dose, giving injection, removing needle, disposal of sharps, storage of unused insulin, disposal of insulin etc. Patient able to provide successful return demonstration. Also reviewed troubleshooting with insulin pen. MD to give patient Rxs for insulin pens and insulin pen needles.   Steroids continuing to taper down. May not need insulin at home when pt resumes all OHAs. Pt monitoring blood sugars at least 2x/day. Would recommend starting off with Novolog 0-9 units TID. Will likely only need a reduced dose of basal insulin (Semglee) at home.   Answered all questions.  Thank you. Ailene Ards, RD, LDN, CDCES Inpatient Diabetes Coordinator [phone]

## 2023-05-22 NOTE — Plan of Care (Signed)

## 2023-05-22 NOTE — Plan of Care (Signed)
  Problem: RH Balance Goal: LTG Patient will maintain dynamic standing balance (PT) Description: LTG:  Patient will maintain dynamic standing balance with assistance during mobility activities (PT) Outcome: Completed/Met   Problem: Sit to Stand Goal: LTG:  Patient will perform sit to stand with assistance level (PT) Description: LTG:  Patient will perform sit to stand with assistance level (PT) Outcome: Completed/Met   Problem: RH Bed to Chair Transfers Goal: LTG Patient will perform bed/chair transfers w/assist (PT) Description: LTG: Patient will perform bed to chair transfers with assistance (PT). Outcome: Completed/Met   Problem: RH Car Transfers Goal: LTG Patient will perform car transfers with assist (PT) Description: LTG: Patient will perform car transfers with assistance (PT). Outcome: Completed/Met   Problem: RH Ambulation Goal: LTG Patient will ambulate in controlled environment (PT) Description: LTG: Patient will ambulate in a controlled environment, # of feet with assistance (PT). Outcome: Completed/Met Goal: LTG Patient will ambulate in home environment (PT) Description: LTG: Patient will ambulate in home environment, # of feet with assistance (PT). Outcome: Completed/Met Goal: LTG Patient will ambulate in community environment (PT) Description: LTG: Patient will ambulate in community environment, # of feet with assistance (PT). Outcome: Completed/Met   Problem: RH Stairs Goal: LTG Patient will ambulate up and down stairs w/assist (PT) Description: LTG: Patient will ambulate up and down # of stairs with assistance (PT) Outcome: Completed/Met   Problem: RH Bed Mobility Goal: LTG Patient will perform bed mobility with assist (PT) Description: LTG: Patient will perform bed mobility with assistance, with/without cues (PT). Outcome: Completed/Met

## 2023-05-22 NOTE — Plan of Care (Signed)
Problem: RH Problem Solving Goal: LTG Patient will demonstrate problem solving for (SLP) Description: LTG:  Patient will demonstrate problem solving for basic/complex daily situations with cues  (SLP) Outcome: Completed/Met   Problem: RH Memory Goal: LTG Patient will demonstrate ability for day to day (SLP) Description: LTG:   Patient will demonstrate ability for day to day recall/carryover during cognitive/linguistic activities with assist  (SLP) Outcome: Completed/Met Goal: LTG Patient will use memory compensatory aids to (SLP) Description: LTG:  Patient will use memory compensatory aids to recall biographical/new, daily complex information with cues (SLP) Outcome: Completed/Met   Problem: RH Attention Goal: LTG Patient will demonstrate this level of attention during functional activites (SLP) Description: LTG:  Patient will will demonstrate this level of attention during functional activites (SLP) Outcome: Completed/Met

## 2023-05-22 NOTE — Progress Notes (Addendum)
PROGRESS NOTE   Subjective/Complaints: No acute events overnight.  UTI symptoms have resolved.  Discharge planned for tomorrow.  ROS: Patient denies fever, rash, sore throat, blurred vision, dizziness, nausea, vomiting, diarrhea, cough, shortness of breath or chest pain, joint or back/neck pain, headache, or mood change.  Denies new motor or sensory changes. + Dysuria-resolved  Objective:   No results found. No results for input(s): "WBC", "HGB", "HCT", "PLT" in the last 72 hours.  Recent Labs    05/20/23 0714  NA 131*  K 4.2  CL 99  CO2 24  GLUCOSE 172*  BUN 27*  CREATININE 0.96  CALCIUM 8.1*    Intake/Output Summary (Last 24 hours) at 05/22/2023 1214 Last data filed at 05/22/2023 0727 Gross per 24 hour  Intake 720 ml  Output 500 ml  Net 220 ml        Physical Exam: Vital Signs Blood pressure 131/75, pulse 70, temperature 98.2 F (36.8 C), temperature source Oral, resp. rate 16, weight 86.1 kg, SpO2 97%.  Constitutional: No distress . Vital signs reviewed.  Sitting in chair HEENT: crani incision with staples- no signed of infection noted, conjugate gaze, MMM membranes moist Neck: supple Cardiovascular: RRR without murmur.   Respiratory/Chest: CTA Bilaterally without wheezes or rales. Normal effort    GI/Abdomen: Soft, nontender, nondistended, normal active bowel sounds  Ext: no clubbing, cyanosis, or edema Psych: pleasant and cooperative  Skin: Clean and intact   Neuro: Alert and awake, reasonable insight and awareness.  follows commands, cranial nerves II through XII grossly intact Moving all extremities to gravity and resistance Sensory exam normal for light touch and pain in all 4 limbs. No limb ataxia or cerebellar signs.   Musculoskeletal: No joint swelling or tenderness No hypertonia noted    Assessment/Plan: 1. Functional deficits which require 3+ hours per day of interdisciplinary therapy in a  comprehensive inpatient rehab setting. Physiatrist is providing close team supervision and 24 hour management of active medical problems listed below. Physiatrist and rehab team continue to assess barriers to discharge/monitor patient progress toward functional and medical goals  Care Tool:  Bathing    Body parts bathed by patient: Right arm, Left arm, Abdomen, Chest, Front perineal area, Buttocks, Left upper leg, Right upper leg, Left lower leg, Right lower leg, Face         Bathing assist Assist Level: Independent with assistive device Assistive Device Comment: RW with MinA for LB task performance   Upper Body Dressing/Undressing Upper body dressing   What is the patient wearing?: Pull over shirt    Upper body assist Assist Level: Independent    Lower Body Dressing/Undressing Lower body dressing      What is the patient wearing?: Underwear/pull up, Pants     Lower body assist Assist for lower body dressing: Independent with assitive device     Toileting Toileting    Toileting assist Assist for toileting: Independent with assistive device     Transfers Chair/bed transfer  Transfers assist     Chair/bed transfer assist level: Independent with assistive device     Locomotion Ambulation   Ambulation assist      Assist level: Contact Guard/Touching assist Assistive  device: Walker-rolling Max distance: 180'   Walk 10 feet activity   Assist     Assist level: Contact Guard/Touching assist Assistive device: Walker-rolling   Walk 50 feet activity   Assist    Assist level: Contact Guard/Touching assist Assistive device: Walker-rolling    Walk 150 feet activity   Assist    Assist level: Contact Guard/Touching assist Assistive device: Walker-rolling    Walk 10 feet on uneven surface  activity   Assist     Assist level: Contact Guard/Touching assist Assistive device: Walker-rolling   Wheelchair     Assist Is the patient using a  wheelchair?: Yes Type of Wheelchair: Manual (distance)    Wheelchair assist level: Supervision/Verbal cueing Max wheelchair distance: 150'    Wheelchair 50 feet with 2 turns activity    Assist        Assist Level: Supervision/Verbal cueing   Wheelchair 150 feet activity     Assist      Assist Level: Supervision/Verbal cueing   Blood pressure 131/75, pulse 70, temperature 98.2 F (36.8 C), temperature source Oral, resp. rate 16, weight 86.1 kg, SpO2 97%.  Medical Problem List and Plan: 1. 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 Jake Samples on 05/12/2023             -patient may shower, cover surgical incisions             -ELOS/Goals: 7-10 days, Mod I PT/OT/SLP             -Continue CIR therapies including PT, OT, and SLP   -Plan for discharge tomorrow 9/13   2.  Antithrombotics: -DVT/anticoagulation:  Pharmaceutical: Heparin started 9/05             -antiplatelet therapy: none   3. Pain Management: Tylenol, Norco as needed   4. Mood/Behavior/Sleep: LCSW to evaluate and provide emotional support             -will increase melatonin to 10mg  q HS  -rx likely UTI             -antipsychotic agents: n/a              5. Neuropsych/cognition: This patient is capable of making decisions on his own behalf.   6. Skin/Wound Care: Routine skin care checks             -monitor surgical incision   7. Fluids/Electrolytes/Nutrition: Routine Is and Os and follow-up chemistries             -adjust to carb modified diet             -monitor PO intake (no current documentation)   8: Hypertension: monitor TID and prn, has been controlled overall             -continue carvedilol 12.5 mg Bid             -continue clonidine 0.2 mg BID             -continue lisinopril 20 mg BID  -9/7 change lisinopril to 10 mg daily due to soft BP  -9/10 BP soft, discontinue lisinopril  -9/11 monitor  response to medication change, BP was a little soft yesterday  Addendum decrease clonidiine to 0.1mg  BID  Decrease coreg to 6.25 mg BID -9/12 BP appears better controlled, continue to monitor response to medication changes  -9/13 BP well-controlled, continue current regimen     05/22/2023  6:10 AM 05/21/2023    6:43 PM 05/21/2023    2:28 PM  Vitals with BMI  Systolic 131 100 213  Diastolic 75 53 64  Pulse 70 72 80      9: Hyperlipidemia: continue Lipitor 10 mg   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 Jake Samples, follow-up with Dr. Barbaraann Cao   11: DM-2: CBGs QID; A1c = 6.9%, steroid induced hyperglycemia (home meds include Januvia 100 mg daily, metformin 1000 mg twice daily, Actos 30 mg daily>>not restarted)             -continue SSI             -continue Semglee 16 units daily  -9/7 increase Semglee to 18 units daily, may need to wean down as steroids are reduced  -9/8 Restart metformin at 500mg  BID  -9/11 Discussed with pharmacy regarding transitioning to oral medications,  Restart Januvia 100, decrease semglee to 14, start tradjenta  -Addendum adjusted insulin to 12 units  9/12 discussed restart of oral medications, insulin was decreased today, will see how he does on this lower dose although may need short-term use of insulin after discharge. Decrease semglee to 6units  9/13 CBGs controlled,  monitor how he does on lower dose of insulin today and tomorrow, he has been restarted on oral medications and insulin sesmglee has been weaned down to 6 units   CBG (last 3)  Recent Labs    05/21/23 1638 05/21/23 2047 05/22/23 0613  GLUCAP 162* 167* 163*      12: GI prophylaxis: continue Protonix BID while on steroids   13: Hyponatremia: Last sodium 130 9/6, follow-up BMP -NA 126, asymptomatic start salt  tabs, fluid restriction, recheck labs later today, check urine osmolality and sodium -Decrease lisinopril dose to 10 mg daily -9/10 sodium a little lower today at 131, lisinopril was discontinued, he is on normal saline 50 cc an hour-will discontinue today.  Recheck BMP tomorrow -9/11 Na stable 131, increase salt tabs to TID, continue fluid restriction  -9/13 recheck lab for today still pending, change test to timed, and will recheck tomorrow as well     Latest Ref Rng & Units 05/20/2023    7:14 AM 05/19/2023    5:38 AM 05/18/2023    7:01 AM  BMP  Glucose 70 - 99 mg/dL 086  578  469   BUN 8 - 23 mg/dL 27  30  26    Creatinine 0.61 - 1.24 mg/dL 6.29  5.28  4.13   Sodium 135 - 145 mmol/L 131  131  134   Potassium 3.5 - 5.1 mmol/L 4.2  4.6  3.9   Chloride 98 - 111 mmol/L 99  99  101   CO2 22 - 32 mmol/L 24  25  26    Calcium 8.9 - 10.3 mg/dL 8.1  8.2  8.0      14: Mild anemia: Hgb 11.5 9/6, follow-up CBC  -9/9 Hgb stable 11.9   -9/13 still pending, will adjust order to 2 timed for today   14: Leukocytosis:               -9/9 WBC continues to be elevated  24.5 , monitor for signs of infection. Likely steroids    -ua,ucx- indicates UTI  Recheck today pending  15.  Acute metabolic encephalopathy: TSH/B12 stable.  Improving   16.  UTI  -UAUCX  -indicates UTI, will start Keflex 9/10 and monitor urine cultures  -9/13 symptoms have resolved, continue Keflex  LOS: 7 days A FACE TO FACE EVALUATION WAS PERFORMED  Fanny Dance 05/22/2023, 12:14 PM

## 2023-05-22 NOTE — Progress Notes (Signed)
Inpatient Rehabilitation Discharge Medication Review by a Pharmacist  A complete drug regimen review was completed for this patient to identify any potential clinically significant medication issues.  High Risk Drug Classes Is patient taking? Indication by Medication  Antipsychotic No   Anticoagulant No   Antibiotic No Keflex- UTI (EOT 9/15)   Opioid Yes Prn Vicodin - pain  Antiplatelet No   Hypoglycemics/insulin Yes Insulin, actos, sitagliptin - DM  Vasoactive Medication Yes Carvedilol, clonidine, lisinopril- HTN  Chemotherapy No   Other Yes Atorvastatin - HLD Allopurinol - gout Methocarbamol - prn spasms Ondansetron - prn N/V Pantoprazole - Reflux Keppra - seizure ppx Salt tabs- HoNa  Dexamethasone - s/p craniotomy     Type of Medication Issue Identified Description of Issue Recommendation(s)  Drug Interaction(s) (clinically significant)     Duplicate Therapy     Allergy     No Medication Administration End Date     Incorrect Dose     Additional Drug Therapy Needed     Significant med changes from prior encounter (inform family/care partners about these prior to discharge).    Other       Clinically significant medication issues were identified that warrant physician communication and completion of prescribed/recommended actions by midnight of the next day:  No  Name of provider notified for urgent issues identified:   Provider Method of Notification:     Pharmacist comments:   Time spent performing this drug regimen review (minutes):  30 minutes  Jani Gravel, PharmD Clinical Pharmacist  05/25/2023 7:20 AM

## 2023-05-23 ENCOUNTER — Other Ambulatory Visit (HOSPITAL_COMMUNITY): Payer: Self-pay

## 2023-05-23 DIAGNOSIS — E871 Hypo-osmolality and hyponatremia: Secondary | ICD-10-CM | POA: Diagnosis not present

## 2023-05-23 DIAGNOSIS — I10 Essential (primary) hypertension: Secondary | ICD-10-CM | POA: Diagnosis not present

## 2023-05-23 DIAGNOSIS — N390 Urinary tract infection, site not specified: Secondary | ICD-10-CM | POA: Diagnosis not present

## 2023-05-23 DIAGNOSIS — D496 Neoplasm of unspecified behavior of brain: Secondary | ICD-10-CM | POA: Diagnosis not present

## 2023-05-23 LAB — BASIC METABOLIC PANEL
Anion gap: 9 (ref 5–15)
BUN: 21 mg/dL (ref 8–23)
CO2: 24 mmol/L (ref 22–32)
Calcium: 7.9 mg/dL — ABNORMAL LOW (ref 8.9–10.3)
Chloride: 99 mmol/L (ref 98–111)
Creatinine, Ser: 1.08 mg/dL (ref 0.61–1.24)
GFR, Estimated: >60 mL/min (ref >60)
Glucose, Bld: 130 mg/dL — ABNORMAL HIGH (ref 70–99)
Potassium: 4.2 mmol/L (ref 3.5–5.1)
Sodium: 132 mmol/L — ABNORMAL LOW (ref 135–145)

## 2023-05-23 LAB — GLUCOSE, CAPILLARY: Glucose-Capillary: 112 mg/dL — ABNORMAL HIGH (ref 70–99)

## 2023-05-23 NOTE — Progress Notes (Signed)
PROGRESS NOTE   Subjective/Complaints:  No acute events overnight.  Ready to go home! Slept ok, denies pain, LBM yesterday, urinating fine, denies any other complaints or concerns today.   ROS: Patient denies fever, rash, sore throat, blurred vision, dizziness, nausea, vomiting, diarrhea, cough, shortness of breath or chest pain, joint or back/neck pain, headache, or mood change.  Denies new motor or sensory changes. + Dysuria-resolved  Objective:   No results found. Recent Labs    05/22/23 1635  WBC 19.1*  HGB 13.0  HCT 40.0  PLT 215    Recent Labs    05/22/23 1635 05/23/23 0717  NA 132* 132*  K 4.9 4.2  CL 100 99  CO2 20* 24  GLUCOSE 158* 130*  BUN 29* 21  CREATININE 1.18 1.08  CALCIUM 8.7* 7.9*    Intake/Output Summary (Last 24 hours) at 05/23/2023 1016 Last data filed at 05/23/2023 0818 Gross per 24 hour  Intake 600 ml  Output 1450 ml  Net -850 ml        Physical Exam: Vital Signs Blood pressure 122/69, pulse 75, temperature 98.2 F (36.8 C), resp. rate 18, weight 86.1 kg, SpO2 95%.  Constitutional: No distress . Vital signs reviewed.  Sitting in bed and used walker in room to restroom HEENT: crani incision with staples- no signed of infection noted, conjugate gaze, MMM membranes moist Neck: supple Cardiovascular: RRR without murmur.   Respiratory/Chest: CTA Bilaterally without wheezes or rales. Normal effort    GI/Abdomen: Soft, nontender, nondistended, normal active bowel sounds  Ext: no clubbing, cyanosis, or edema Psych: pleasant and cooperative  Skin: Clean and intact aside from crani incision as above  PRIOR EXAMS: Neuro: Alert and awake, reasonable insight and awareness.  follows commands, cranial nerves II through XII grossly intact Moving all extremities to gravity and resistance Sensory exam normal for light touch and pain in all 4 limbs. No limb ataxia or cerebellar signs.    Musculoskeletal: No joint swelling or tenderness No hypertonia noted    Assessment/Plan: 1. Functional deficits which require 3+ hours per day of interdisciplinary therapy in a comprehensive inpatient rehab setting. Physiatrist is providing close team supervision and 24 hour management of active medical problems listed below. Physiatrist and rehab team continue to assess barriers to discharge/monitor patient progress toward functional and medical goals  Care Tool:  Bathing    Body parts bathed by patient: Right arm, Left arm, Abdomen, Chest, Front perineal area, Buttocks, Left upper leg, Right upper leg, Left lower leg, Right lower leg, Face         Bathing assist Assist Level: Independent with assistive device Assistive Device Comment: RW with MinA for LB task performance   Upper Body Dressing/Undressing Upper body dressing   What is the patient wearing?: Pull over shirt    Upper body assist Assist Level: Independent    Lower Body Dressing/Undressing Lower body dressing      What is the patient wearing?: Underwear/pull up, Pants     Lower body assist Assist for lower body dressing: Independent with assitive device     Toileting Toileting    Toileting assist Assist for toileting: Independent with assistive device  Transfers Chair/bed transfer  Transfers assist     Chair/bed transfer assist level: Independent with assistive device     Locomotion Ambulation   Ambulation assist      Assist level: Independent with assistive device Assistive device: Walker-rolling Max distance: 385'   Walk 10 feet activity   Assist     Assist level: Independent with assistive device Assistive device: Walker-rolling   Walk 50 feet activity   Assist    Assist level: Independent with assistive device Assistive device: Walker-rolling    Walk 150 feet activity   Assist    Assist level: Independent with assistive device Assistive device:  Walker-rolling    Walk 10 feet on uneven surface  activity   Assist     Assist level: Supervision/Verbal cueing Assistive device: Walker-rolling   Wheelchair     Assist Is the patient using a wheelchair?: Yes Type of Wheelchair: Manual    Wheelchair assist level: Independent Max wheelchair distance: 150'    Wheelchair 50 feet with 2 turns activity    Assist        Assist Level: Independent   Wheelchair 150 feet activity     Assist      Assist Level: Independent   Blood pressure 122/69, pulse 75, temperature 98.2 F (36.8 C), resp. rate 18, weight 86.1 kg, SpO2 95%.  Medical Problem List and Plan: 1. 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 Jake Samples on 05/12/2023             -patient may shower, cover surgical incisions             -ELOS/Goals: 7-10 days, Mod I PT/OT/SLP             -Continue CIR therapies including PT, OT, and SLP   -05/23/23 d/c today! Meds reviewed with pt and family   2.  Antithrombotics: -DVT/anticoagulation:  Pharmaceutical: Heparin started 9/05             -antiplatelet therapy: none   3. Pain Management: Tylenol, Norco as needed   4. Mood/Behavior/Sleep: LCSW to evaluate and provide emotional support             -will increase melatonin to 10mg  q HS  -rx likely UTI             -antipsychotic agents: n/a              5. Neuropsych/cognition: This patient is capable of making decisions on his own behalf.   6. Skin/Wound Care: Routine skin care checks             -monitor surgical incision   7. Fluids/Electrolytes/Nutrition: Routine Is and Os and follow-up chemistries             -adjust to carb modified diet             -monitor PO intake (no current documentation)   8: Hypertension: monitor TID and prn, has been controlled overall             -continue carvedilol 12.5 mg Bid             -continue clonidine 0.2 mg BID              -continue lisinopril 20 mg BID  -9/7 change lisinopril to 10 mg daily due to soft BP  -9/10 BP soft, discontinue lisinopril  -9/11 monitor response to medication change, BP was a little soft  yesterday  Addendum decrease clonidiine to 0.1mg  BID  Decrease coreg to 6.25 mg BID -9/12 BP appears better controlled, continue to monitor response to medication changes  -05/23/23 BP well-controlled, continue current regimen, monitor at home  Vitals:   05/19/23 2031 05/20/23 0515 05/20/23 1320 05/20/23 1756  BP: (!) 127/58 111/63 98/63 120/61   05/20/23 2000 05/21/23 0441 05/21/23 1428 05/21/23 1843  BP: 123/66 120/65 110/64 (!) 100/53   05/22/23 0610 05/22/23 1304 05/22/23 2053 05/23/23 0552  BP: 131/75 (!) 133/46 (!) 123/57 122/69      9: Hyperlipidemia: continue Lipitor 10 mg   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 Jake Samples, follow-up with Dr. Barbaraann Cao   11: DM-2: CBGs QID; A1c = 6.9%, steroid induced hyperglycemia (home meds include Januvia 100 mg daily, metformin 1000 mg twice daily, Actos 30 mg daily>>not restarted)             -continue SSI             -continue Semglee 16 units daily  -9/7 increase Semglee to 18 units daily, may need to wean down as steroids are reduced  -9/8 Restart metformin at 500mg  BID  -9/11 Discussed with pharmacy regarding transitioning to oral medications,  Restart Januvia 100, decrease semglee to 14, start tradjenta  -Addendum adjusted insulin to 12 units  9/12 discussed restart of oral medications, insulin was decreased today, will see how he does on this lower dose although may need short-term use of insulin after discharge. Decrease semglee to 6units  9/13 CBGs controlled,  monitor how he does on lower dose of insulin today and tomorrow, he has been restarted on oral medications  and insulin sesmglee has been weaned down to 6 units  -05/23/23 CBGs improving, advised to cont home meds, f/up outpatient   CBG (last 3)  Recent Labs    05/22/23 1635 05/22/23 2119 05/23/23 0605  GLUCAP 187* 98 112*      12: GI prophylaxis: continue Protonix BID while on steroids   13: Hyponatremia: Last sodium 130 9/6, follow-up BMP -NA 126, asymptomatic start salt tabs, fluid restriction, recheck labs later today, check urine osmolality and sodium -Decrease lisinopril dose to 10 mg daily -9/10 sodium a little lower today at 131, lisinopril was discontinued, he is on normal saline 50 cc an hour-will discontinue today.  Recheck BMP tomorrow -9/11 Na stable 131, increase salt tabs to TID, continue fluid restriction  -9/13 recheck lab for today still pending, change test to timed, and will recheck tomorrow as well  -05/23/23 Na 132, improving, monitor outpatient     Latest Ref Rng & Units 05/23/2023    7:17 AM 05/22/2023    4:35 PM 05/20/2023    7:14 AM  BMP  Glucose 70 - 99 mg/dL 621  308  657   BUN 8 - 23 mg/dL 21  29  27    Creatinine 0.61 - 1.24 mg/dL 8.46  9.62  9.52   Sodium 135 - 145 mmol/L 132  132  131   Potassium 3.5 - 5.1 mmol/L 4.2  4.9  4.2   Chloride 98 - 111 mmol/L 99  100  99   CO2  22 - 32 mmol/L 24  20  24    Calcium 8.9 - 10.3 mg/dL 7.9  8.7  8.1      14: Mild anemia: Hgb 11.5 9/6, follow-up CBC  -9/9 Hgb stable 11.9   -9/13 still pending, will adjust order to 2 timed for today   14: Leukocytosis:               -9/9 WBC continues to be elevated 24.5 , monitor for signs of infection. Likely steroids    -ua,ucx- indicates UTI  Recheck today pending  15.  Acute metabolic encephalopathy: TSH/B12 stable.  Improving   16.  UTI  -UAUCX  -indicates UTI, will start Keflex 9/10 and monitor urine cultures  -9/13 symptoms have resolved, continue Keflex  -05/23/23 UCx with proteus, sensitive to cephalosporins, cont keflex  LOS: 8 days A FACE TO FACE EVALUATION  WAS PERFORMED  [address] 05/23/2023, 10:16 AM

## 2023-05-24 ENCOUNTER — Encounter: Payer: Self-pay | Admitting: Internal Medicine

## 2023-05-25 ENCOUNTER — Other Ambulatory Visit: Payer: Self-pay | Admitting: Internal Medicine

## 2023-05-25 MED ORDER — GLUCOSE BLOOD VI STRP: ORAL_STRIP | 12 refills | Status: DC

## 2023-05-25 MED ORDER — ONETOUCH VERIO FLEX SYSTEM W/DEVICE KIT: PACK | 0 refills | Status: DC

## 2023-05-25 MED ORDER — ONETOUCH ULTRASOFT LANCETS MISC: 12 refills | Status: DC

## 2023-05-25 NOTE — Progress Notes (Signed)
Inpatient Rehabilitation Care Coordinator Discharge Note   Patient Details  Name: Alex Church MRN: [id number] Date of Birth: 01-04-1946   Discharge location: D/c to home with his wife. Friend to stay with pt while his wife is working during the day  Length of Stay: 7 days  Discharge activity level: Supervision/Mod I  Home/community participation: Limited  Patient response [id number] Literacy - How often do you need to have someone help you when you read instructions, pamphlets, or other written material from your doctor or pharmacy?: Never  Patient response MV:[id number] Isolation - How often do you feel lonely or isolated from those around you?: Never  Services provided included: MD, RD, PT, OT, SLP, CM, Pharmacy, Neuropsych, SW, TR, Industrial/product designer Services:  Field seismologist Utilized: [employer]  Choices offered to/list presented to: patient and pt wife  Follow-up services arranged:  Outpatient, DME    Outpatient Servicies: [facility] for Outpatient PT/OT/SLP DME : [employer] for RW    Patient response to transportation need: Is the patient able to respond to transportation needs?: Yes In the past 12 months, has lack of transportation kept you from medical appointments or from getting medications?: No In the past 12 months, has lack of transportation kept you from meetings, work, or from getting things needed for daily living?: No   Patient/Family verbalized understanding of follow-up arrangements:  Yes  Individual responsible for coordination of the follow-up plan: contact pt wife Angela#[phone]  Confirmed correct DME delivered: Gretchen Short 05/25/2023    Comments (or additional information):fam edu completed  Summary of Stay    Date/Time Discharge Planning CSW  05/18/23 0933 Pt will d/c to home with wife who will be primary caregiver. Pt will need to be intermittent supervision to Mod I at discharge since wife works 7:30am-5pm. Pt  will see if family is able to stay with him periodically throughout the day. Fam edu scheduled for Wed (9/11) 8am-12pm. SW will confirm there are no barriers to discharge. AAC       Lyndle Pang A Lula Olszewski

## 2023-05-27 ENCOUNTER — Telehealth: Payer: Self-pay | Admitting: *Deleted

## 2023-05-27 LAB — SURGICAL PATHOLOGY

## 2023-05-27 NOTE — Telephone Encounter (Signed)
Referral fax received at MD nurse desk from [facility] regarding scheduling new patient appointment ASAP.  Dr. Barbaraann Cao informed and requested patient be scheduled next Tuesday 9/24.  Contacted patient/wife and scheduled patient for 06/02/23 at 10 am

## 2023-06-01 ENCOUNTER — Encounter (HOSPITAL_COMMUNITY): Payer: Self-pay | Admitting: Speech Pathology

## 2023-06-01 ENCOUNTER — Ambulatory Visit (HOSPITAL_COMMUNITY): Payer: Medicare Other | Attending: Physical Medicine and Rehabilitation | Admitting: Speech Pathology

## 2023-06-01 ENCOUNTER — Other Ambulatory Visit (HOSPITAL_COMMUNITY): Payer: Self-pay

## 2023-06-01 ENCOUNTER — Encounter: Payer: Self-pay | Admitting: Internal Medicine

## 2023-06-01 DIAGNOSIS — R41841 Cognitive communication deficit: Secondary | ICD-10-CM | POA: Diagnosis not present

## 2023-06-01 MED ORDER — CEPHALEXIN 500 MG PO CAPS
500.0000 mg | ORAL_CAPSULE | Freq: Four times a day (QID) | ORAL | 0 refills | Status: DC
  Filled 2023-06-01: qty 28, 7d supply, fill #0

## 2023-06-01 NOTE — Therapy (Signed)
OUTPATIENT SPEECH LANGUAGE PATHOLOGY EVALUATION   Patient Name: Alex Church MRN: [id number] DOB:July 11, 1946, 77 y.o., male Today's Date: 06/01/2023  PCP: Wanda Plump, MD REFERRING PROVIDER: Jacquelynn Cree, PA-C  END OF SESSION:  End of Session - 06/01/23 1501     Visit Number 1    Number of Visits 6    Date for SLP Re-Evaluation 07/09/23    Authorization Type BCBS Medicare   PER BLUE E (bcbs MEDICARE) EFF 09/08/22 DED MET $3500 OOP (0) MET 10.00 COPAY NO AUTH   SLP Start Time 0850    SLP Stop Time  0930    SLP Time Calculation (min) 40 min    Activity Tolerance Patient tolerated treatment well             Past Medical History:  Diagnosis Date   B12 deficiency    monthly shots   Diabetes mellitus    Glaucoma suspect    Hyperlipemia    Hypertension    Past Surgical History:  Procedure Laterality Date   APPLICATION OF CRANIAL NAVIGATION Right 05/12/2023   Procedure: APPLICATION OF CRANIAL NAVIGATION;  Surgeon: Bethann Goo, DO;  Location: [facility] OR;  Service: Neurosurgery;  Laterality: Right;   CRANIOTOMY Right 05/12/2023   Procedure: RIGHT STERIOSTACTIC FRONTAL CRANIOTOMY FOR TUMOR RESECTION;  Surgeon: Bethann Goo, DO;  Location: [facility] OR;  Service: Neurosurgery;  Laterality: Right;   NO PAST SURGERIES     Patient Active Problem List   Diagnosis Date Noted   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   Left-sided weakness 05/06/2023   Acute encephalopathy 05/06/2023   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) 05/05/2023   PVC (premature ventricular contraction) 08/27/2022   Glaucoma suspect 04/30/2020   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   Annual physical exam 06/30/2016   PCP NOTES >>>>>>>>>>>>>>>>>>>>> 02/25/2016   DM2 (diabetes mellitus, type 2) (HCC) 07/06/2013   B12 deficiency 07/06/2013   CAROTID BRUIT, RIGHT 10/01/2010   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 male was admitted to [facility] on 05/05/2023 by way of transfer from [facility] with new diagnosis of right frontal brain mass after presenting from home to [facility] ED complaining of left-sided weakness. The patient presented with complaint of 4 days of left-sided weakness involving the left upper and lower extremity, starting on Saturday, 05/02/2023. Denied any associated acute focal numbness or paresthesias nor any acute vertigo, dysphagia, dysarthria, facial droop, acute change in vision. The patient's family also conveyed that the patient had appeared progressively confused over the last few months, starting in May 2024. Over that time, they had noticed the patient becoming more forgetful, getting lost while driving, although very familiar with these routes. MRI brain with and without contrast showed large mass located within the right frontal lobe measuring 6.8 x 4.9 cm associate with mass effect on the right lateral ventricle and 6 mm of leftward midline shift in the absence of any evidence of acute infarct in the absence of any evidence  of intracranial hemorrhage. EDP at [facility] d/w on-call neurosurgeon, Dr. Jake Samples , who recommended [facility] admission to [facility]. Dr. Jake Samples conveyed that neurosurgery would formally consult, and did not feel that emergent surgery is indicated. Rather, neurosurgery anticipated surgical intervention on Thursday, 05/07/2023, and recommends interval Decadron 4 mg every 6 hours. The patient underwent right stereotactic frontal craniotomy for resection of  tumor by Dr. Jake Samples on 05/12/2023. He was in inpatient rehab from 05/15/23-05/23/23 and discharged home with his wife. He was referred for outpatient SLP services by Delle Reining, PA-C.  PAIN:  Are you having pain? No  FALLS: Has patient fallen in last 6 months?  Yes  LIVING ENVIRONMENT: Lives with: lives with their spouse Lives in: House/apartment  PLOF:  Level of assistance: Independent with ADLs, Independent with IADLs Employment: Part-time employment  PATIENT GOALS: Improve memory  OBJECTIVE:   DIAGNOSTIC FINDINGS: MRI 05/13/2023 Expected postoperative findings after debulking of a large right frontal mass, detailed above.  COGNITION: Overall cognitive status: Impaired Areas of impairment:  Attention: Impaired: Sustained Memory: Impaired: Short term Executive function: Impaired: Planning Functional deficits: Pt with mild working memory and attention deficits. He recalled 4/5 objects in delayed recall task. His wife has always handled his medications at home and continues to do so now.   AUDITORY COMPREHENSION: Overall auditory comprehension: Appears intact YES/NO questions: Appears intact Following directions: Appears intact Conversation: Complex Interfering components: attention Effective technique: repetition/stressing words  READING COMPREHENSION: Not assessed  EXPRESSION: verbal  VERBAL EXPRESSION: Level of generative/spontaneous verbalization: conversation Automatic speech: name: intact and social response: intact  Repetition: Appears intact Naming: Responsive: 76-100%, Confrontation: 76-100%, and Divergent: 76-100% Pragmatics: Appears intact Comments: Expresses complex thoughts independently Interfering components:  N/A Effective technique:  N/A Non-verbal means of communication: N/A  WRITTEN EXPRESSION: Dominant hand: right Written expression: Not tested  MOTOR SPEECH: Overall motor speech: Appears intact Level of impairment: Conversation Respiration:  thoracic breathing Phonation: normal Resonance: WFL Articulation: Appears intact Intelligibility: Intelligible Motor planning: Appears intact Motor speech errors:  N/A Interfering components:  N/A Effective technique:  N/A  ORAL MOTOR EXAMINATION: Overall status: WFL Comments: N/A  STANDARDIZED ASSESSMENTS: SLUMS: 27/30   TODAY'S TREATMENT:      Evaluation completed this day. See below for clinical impression.                                                                                                                                  DATE: 06/01/23  PATIENT EDUCATION: Education details: Plan for short term SLP services to address attention and memory deficits, will need to work around radiation schedule Person educated: Patient and Spouse Education method: Explanation Education comprehension: verbalized understanding   GOALS: Goals reviewed with patient? Yes  SHORT TERM GOALS: Target date: 07/09/2023  Pt will implement memory strategies in functional therapy activities with 90% acc with min cues.  Baseline: 80% Goal status: INITIAL  2.  Pt will complete selective and alternating attention  tasks (moderately complex) with 90% acc with use of strategies and min cues.  Baseline: 80% Goal status: INITIAL  3.  Pt will complete moderate-level thought organization and planning activities with 90% acc and min assist Baseline: mi/mod assist and extra time provided Goal status: INITIAL   LONG TERM GOALS: Same as short term goals  ASSESSMENT:  CLINICAL IMPRESSION: Patient is a 77 y.o. male who was seen today for a cognitive linguistic evaluation s/p Right stereotactic frontal craniotomy for resection of tumor on 05/12/2023. Pt and spouse report improvement in previously noted cognitive deficits before brain tumor was removed. 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 His wife is responsible for medication management  and bill paying. Pt is active with cooking, driving, cleaning, working in the yard, and interacting with his dog. Wife notes that he became less interested in activities he previously enjoyed, got lost at work, and fell at home before the tumor was identified and removed. Pt reports only mild residual memory changes now and decreased energy. He scored a 27/30 on the [facility] with errors in memory and clock drawing. He indicated that he would like to be able to drive again and Trails A was administered and he completed in 122 seconds (deficient is >78 seconds) and Trails B in 217 seconds (deficient is >273 seconds). Recommend short course of SLP therapy to address mild attention, memory, and executive functioning deficits, however this will likely be complicated by plan for radiation therapy Monday through Friday for 6 weeks. Will plan to schedule whenever convenient for Pt and his wife.   OBJECTIVE IMPAIRMENTS: include attention, memory, and executive functioning. These impairments are limiting patient from return to work and managing medications. Factors affecting potential to achieve goals and functional outcome are ability to learn/carryover information. Patient will benefit from skilled SLP services to address above impairments and improve overall function.  REHAB POTENTIAL: Excellent  PLAN:  SLP FREQUENCY: 2x/week  SLP DURATION: 3 weeks  PLANNED INTERVENTIONS: Cueing hierachy, Cognitive reorganization, Internal/external aids, Functional tasks, SLP instruction and feedback, Compensatory strategies, and Patient/family education   Thank you,  Havery Moros, CCC-SLP [phone]  Amiyrah Lamere, CCC-SLP 06/01/2023, 3:04 PM

## 2023-06-01 NOTE — Telephone Encounter (Signed)
UTI documented by urine culture, + Proteus. Will send Keflex.

## 2023-06-02 ENCOUNTER — Encounter: Payer: Self-pay | Admitting: Internal Medicine

## 2023-06-02 ENCOUNTER — Inpatient Hospital Stay: Payer: Medicare Other | Attending: Internal Medicine | Admitting: Internal Medicine

## 2023-06-02 ENCOUNTER — Telehealth: Payer: Self-pay

## 2023-06-02 ENCOUNTER — Other Ambulatory Visit (HOSPITAL_COMMUNITY): Payer: Self-pay

## 2023-06-02 ENCOUNTER — Other Ambulatory Visit: Payer: Self-pay | Admitting: Radiation Therapy

## 2023-06-02 ENCOUNTER — Other Ambulatory Visit (HOSPITAL_BASED_OUTPATIENT_CLINIC_OR_DEPARTMENT_OTHER): Payer: Self-pay

## 2023-06-02 VITALS — BP 150/74 | HR 69 | Temp 97.3°F | Resp 18 | Wt 181.5 lb

## 2023-06-02 DIAGNOSIS — C711 Malignant neoplasm of frontal lobe: Secondary | ICD-10-CM | POA: Diagnosis not present

## 2023-06-02 DIAGNOSIS — C719 Malignant neoplasm of brain, unspecified: Secondary | ICD-10-CM

## 2023-06-02 DIAGNOSIS — N400 Enlarged prostate without lower urinary tract symptoms: Secondary | ICD-10-CM | POA: Diagnosis not present

## 2023-06-02 DIAGNOSIS — I10 Essential (primary) hypertension: Secondary | ICD-10-CM | POA: Diagnosis not present

## 2023-06-02 DIAGNOSIS — Z79899 Other long term (current) drug therapy: Secondary | ICD-10-CM | POA: Insufficient documentation

## 2023-06-02 DIAGNOSIS — E119 Type 2 diabetes mellitus without complications: Secondary | ICD-10-CM | POA: Insufficient documentation

## 2023-06-02 DIAGNOSIS — E785 Hyperlipidemia, unspecified: Secondary | ICD-10-CM | POA: Diagnosis not present

## 2023-06-02 DIAGNOSIS — Z7984 Long term (current) use of oral hypoglycemic drugs: Secondary | ICD-10-CM | POA: Insufficient documentation

## 2023-06-02 DIAGNOSIS — Z794 Long term (current) use of insulin: Secondary | ICD-10-CM | POA: Diagnosis not present

## 2023-06-02 MED ORDER — TEMOZOLOMIDE 140 MG PO CAPS
140.0000 mg | ORAL_CAPSULE | Freq: Every day | ORAL | 0 refills | Status: DC
  Filled 2023-06-03: qty 42, 42d supply, fill #0

## 2023-06-02 MED ORDER — ONDANSETRON HCL 8 MG PO TABS
8.0000 mg | ORAL_TABLET | Freq: Three times a day (TID) | ORAL | 1 refills | Status: DC | PRN
  Filled 2023-06-02: qty 30, 10d supply, fill #0
  Filled 2023-07-07: qty 30, 10d supply, fill #1

## 2023-06-02 NOTE — Telephone Encounter (Signed)
Oral Oncology Pharmacist Encounter  Received new prescription for Temodar (temozolomide) for the treatment of IDH-wildtype glioblastoma in conjunction with radiation, planned duration 42 days (6 weeks).  Labs from 05/22/23 (CBC and BMP) assessed, no interventions needed. Prescription dose and frequency assessed for appropriateness. Dose based on most recent BSA of 2.04 at 75mg /m2 once daily.   Current medication list in Epic reviewed, no significant/ relevant DDIs with Temodar identified.   Evaluated chart and no patient barriers to medication adherence noted.   Patient agreement for treatment documented in MD note on 06/02/2023.  Prescription has been e-scribed to the [facility] for benefits analysis and approval.  Oral Oncology Clinic will continue to follow for insurance authorization, copayment issues, initial counseling and start date.  Bethel Born, PharmD Hematology/Oncology Clinical Pharmacist [facility] Oral Chemotherapy Navigation Clinic [phone] 06/02/2023 11:48 AM

## 2023-06-02 NOTE — Progress Notes (Signed)
[facility], [facility] [address]  [place], [place] [address] [phone]   New Patient Evaluation  Date of Service: 06/02/23 Patient Name: Alex Church Patient MRN: [id number] Patient DOB: 1946-04-19 Provider: Henreitta Leber, MD  Identifying Statement:  Alex Church is a 77 y.o. male with right frontal glioblastoma who presents for initial consultation and evaluation.    Referring Provider: Wanda Plump, MD [address] [place],  [place] [address]  Oncologic History: Oncology History  Glioblastoma, IDH-wildtype (HCC)  05/12/2023 Surgery   Right frontal craniotomy, resection with Dr. Jake Samples; path is glioblastoma IDH-wt   05/15/2023 Initial Diagnosis   Glioblastoma, IDH-wildtype (HCC)     Biomarkers:  MGMT Unknown.  IDH 1/2 Wild type.  EGFR Unknown  TERT Unknown    History of Present Illness: The patient's records from the referring physician were obtained and reviewed and the patient interviewed to confirm this HPI.  [name] presented to neurologic attention in late August with several days history of left sided weakness, confusion.  Wife noticed he had difficulty getting out of bed, was dragging left side.  This was on top of several months history of progressive confusion, short term memory impairment.  CNS imaging demonstrated a large right frontal mass.  He underwent craniotomy and resection with Dr. Jake Samples, path demonstrated glioblastoma.  Since surgery he has been doing well, walking independently.  Denies seizures, headaches.  He is off decadron.   Medications: Current Outpatient Medications on File Prior to Visit  Medication Sig Dispense Refill   acetaminophen (TYLENOL) 325 MG tablet Take 1-2 tablets (325-650 mg total) by mouth every 4 (four) hours as needed for mild pain.     allopurinol (ZYLOPRIM) 100 MG tablet Take 1 tablet (100 mg total) by mouth 2 (two) times daily. 180 tablet 1   atorvastatin (LIPITOR) 10  MG tablet Take 1 tablet (10 mg total) by mouth at bedtime. Due for appt 05/2023 90 tablet 0   Blood Glucose Monitoring Suppl (ONETOUCH VERIO FLEX SYSTEM) w/Device KIT Check blood sugar once daily 1 kit 0   carvedilol (COREG) 12.5 MG tablet Take 0.5 tablets (6.25 mg total) by mouth 2 (two) times daily with a meal. Due for appt 05/2023     cephALEXin (KEFLEX) 500 MG capsule Take 1 capsule (500 mg total) by mouth 4 (four) times daily. 28 capsule 0   cloNIDine (CATAPRES) 0.1 MG tablet Take 1 tablet (0.1 mg total) by mouth 2 (two) times daily. 60 tablet 0   cyanocobalamin (VITAMIN B12) 1000 MCG tablet Take 1,000 mcg by mouth every other day.     glucose blood test strip Check blood sugars once daily 100 each 12   insulin glargine (LANTUS) 100 UNIT/ML Solostar Pen Inject 6 Units into the skin daily. Expires 28 days after first use 15 mL 0   Insulin Pen Needle (AUM PEN NEEDLE) 32G X 4 MM MISC Use 4 (four) times daily -  before meals and at bedtime. 100 each 0   Lancets (ONETOUCH ULTRASOFT) lancets Check bloods sugars once daily 100 each 12   levETIRAcetam (KEPPRA) 500 MG tablet Take 1 tablet (500 mg total) by mouth 2 (two) times daily. 60 tablet 0   Magnesium Oxide 400 MG CAPS Take 2 capsules (800 mg total) by mouth 2 (two) times daily. 60 capsule 0   melatonin 5 MG TABS Take 1 tablet (5 mg total) by mouth at bedtime. 30 tablet 0  metFORMIN (GLUCOPHAGE) 1000 MG tablet Take 0.5 tablets (500 mg total) by mouth 2 (two) times daily with a meal.     pantoprazole (PROTONIX) 40 MG tablet Take 1 tablet (40 mg total) by mouth 2 (two) times daily. 60 tablet 0   pioglitazone (ACTOS) 30 MG tablet Take 1 tablet (30 mg total) by mouth daily.     polyethylene glycol (MIRALAX / GLYCOLAX) 17 g packet Take 17 g by mouth daily as needed for mild constipation. 14 each 0   sitaGLIPtin (JANUVIA) 100 MG tablet Take 1 tablet (100 mg total) by mouth daily.     sodium chloride 1 g tablet Take 1 tablet (1 g total) by mouth 3 (three)  times daily with meals. 90 tablet 0   No current facility-administered medications on file prior to visit.    Allergies: No Known Allergies Past Medical History:  Past Medical History:  Diagnosis Date   B12 deficiency    monthly shots   Diabetes mellitus    Glaucoma suspect    Hyperlipemia    Hypertension    Past Surgical History:  Past Surgical History:  Procedure Laterality Date   APPLICATION OF CRANIAL NAVIGATION Right 05/12/2023   Procedure: APPLICATION OF CRANIAL NAVIGATION;  Surgeon: Dawley, Alan Mulder, DO;  Location: [facility] OR;  Service: Neurosurgery;  Laterality: Right;   CRANIOTOMY Right 05/12/2023   Procedure: RIGHT STERIOSTACTIC FRONTAL CRANIOTOMY FOR TUMOR RESECTION;  Surgeon: Bethann Goo, DO;  Location: [facility] OR;  Service: Neurosurgery;  Laterality: Right;   NO PAST SURGERIES     Social History:  Social History   Socioeconomic History   Marital status: Married    Spouse name: Not on file   Number of children: 0   Years of education: Not on file   Highest education level: Not on file  Occupational History   Occupation: retired from [employer], works for a lab driving  Tobacco Use   Smoking status: Never   Smokeless tobacco: Never  Substance and Sexual Activity   Alcohol use: No   Drug use: No   Sexual activity: Not on file  Other Topics Concern   Not on file  Social History Narrative   Married (2nd marriage)   Lives w/ wife     Has a rescue dog   Social Determinants of Health   Financial Resource Strain: Low Risk  (10/21/2022)   Overall Financial Resource Strain (CARDIA)    Difficulty of Paying Living Expenses: Not hard at all  Food Insecurity: No Food Insecurity (06/02/2023)   Hunger Vital Sign    Worried About Running Out of Food in the Last Year: Never true    Ran Out of Food in the Last Year: Never true  Transportation Needs: No Transportation Needs (06/02/2023)   PRAPARE - Administrator, Civil Service (Medical): No    Lack of Transportation  (Non-Medical): No  Physical Activity: Sufficiently Active (10/10/2021)   Exercise Vital Sign    Days of Exercise per Week: 7 days    Minutes of Exercise per Session: 30 min  Stress: No Stress Concern Present (10/21/2022)   [employer] of Occupational Health - Occupational Stress Questionnaire    Feeling of Stress : Not at all  Social Connections: Moderately Isolated (10/10/2021)   Social Connection and Isolation Panel [NHANES]    Frequency of Communication with Friends and Family: More than three times a week    Frequency of Social Gatherings with Friends and Family: More than three times a  week    Attends Religious Services: Never    Active Member of Clubs or Organizations: No    Attends Banker Meetings: Never    Marital Status: Married  Catering manager Violence: Not At Risk (06/02/2023)   Humiliation, Afraid, Rape, and Kick questionnaire    Fear of Current or Ex-Partner: No    Emotionally Abused: No    Physically Abused: No    Sexually Abused: No   Family History:  Family History  Problem Relation Age of Onset   Diabetes Maternal Uncle    Heart attack Brother 86   Cancer Mother        intra-abdominal   Cirrhosis Cousin        maternal   Diabetes Cousin        maternal   Stroke Neg Hx    Colon cancer Neg Hx    Prostate cancer Neg Hx     Review of Systems: Constitutional: Doesn't report fevers, chills or abnormal weight loss Eyes: Doesn't report blurriness of vision Ears, nose, mouth, throat, and face: Doesn't report sore throat Respiratory: Doesn't report cough, dyspnea or wheezes Cardiovascular: Doesn't report palpitation, chest discomfort  Gastrointestinal:  Doesn't report nausea, constipation, diarrhea GU: Doesn't report incontinence Skin: Doesn't report skin rashes Neurological: Per HPI Musculoskeletal: Doesn't report joint pain Behavioral/Psych: Doesn't report anxiety  Physical Exam: Vitals:   06/02/23 1000  BP: (!) 150/74  Pulse: 69   Resp: 18  Temp: (!) 97.3 F (36.3 C)  SpO2: 99%   KPS: 80. General: Alert, cooperative, pleasant, in no acute distress Head: Normal EENT: No conjunctival injection or scleral icterus.  Lungs: Resp effort normal Cardiac: Regular rate Abdomen: Non-distended abdomen Skin: No rashes cyanosis or petechiae. Extremities: No clubbing or edema  Neurologic Exam: Mental Status: Awake, alert, attentive to examiner. Oriented to self and environment. Language is fluent with intact comprehension.  Cranial Nerves: Visual acuity is grossly normal. Visual fields are full. Extra-ocular movements intact. No ptosis. Face is symmetric Motor: Tone and bulk are normal. Power is full in both arms and legs. Reflexes are symmetric, no pathologic reflexes present.  Sensory: Intact to light touch Gait: Normal.   Labs: I have reviewed the data as listed    Component Value Date/Time   NA 132 (L) 05/23/2023 0717   NA 139 04/06/2022 1047   K 4.2 05/23/2023 0717   CL 99 05/23/2023 0717   CO2 24 05/23/2023 0717   GLUCOSE 130 (H) 05/23/2023 0717   GLUCOSE 147 (H) 08/26/2006 1051   BUN 21 05/23/2023 0717   BUN 17 04/06/2022 1047   CREATININE 1.08 05/23/2023 0717   CREATININE 1.09 06/20/2020 1509   CALCIUM 7.9 (L) 05/23/2023 0717   PROT 4.9 (L) 05/16/2023 0444   ALBUMIN 1.9 (L) 05/16/2023 0444   AST 17 05/16/2023 0444   ALT 29 05/16/2023 0444   ALKPHOS 51 05/16/2023 0444   BILITOT 0.9 05/16/2023 0444   GFRNONAA >60 05/23/2023 0717   GFRAA 126 08/26/2007 0943   Lab Results  Component Value Date   WBC 19.1 (H) 05/22/2023   NEUTROABS 24.9 (H) 05/16/2023   HGB 13.0 05/22/2023   HCT 40.0 05/22/2023   MCV 91.7 05/22/2023   PLT 215 05/22/2023    Imaging:  MR BRAIN W WO CONTRAST  Result Date: 05/13/2023 CLINICAL DATA:  Craniotomy, post-op EXAM: MRI HEAD WITHOUT AND WITH CONTRAST TECHNIQUE: Multiplanar, multiecho pulse sequences of the brain and surrounding structures were obtained without and with  intravenous contrast. CONTRAST:  8mL GADAVIST GADOBUTROL 1 MMOL/ML IV SOLN COMPARISON:  MRI head 05/05/2023. FINDINGS: Brain: Interval debulking of a right frontal lesion appears larger section cavity with expected postoperative blood products in the resection cavity as well as overlying extra-axial spaces. Small to moderate volume of pneumocephalus antidependently along the frontal convexities anteriorly. Surrounding T2/FLAIR hyperintensities is mildly improved and mass effect is mildly improved. Leftward midline shift now measures approximately 4 mm anteriorly. No suspicious residual enhancement, although postoperative left limits assessment. Mm continued attention on follow-up. No evidence of acute infarct or hydrocephalus. Vascular: Major arterial flow voids are maintained at the skull base. Skull and upper cervical spine: Normal marrow signal. Sinuses/Orbits: Clear sinuses.  No acute orbital findings. Other: No mastoid effusions. IMPRESSION: Expected postoperative findings after debulking of a large right frontal mass, detailed above. Electronically Signed   By: Feliberto Harts M.D.   On: 05/13/2023 13:12   CT DBS HEAD W/O CONTRAST ( )  Result Date: 05/12/2023 CLINICAL DATA:  [id number] Malignant frontal lobe tumor (HCC) [id number] EXAM: CT HEAD WITHOUT CONTRAST TECHNIQUE: Contiguous axial images were obtained from the base of the skull through the vertex without intravenous contrast. RADIATION DOSE REDUCTION: This exam was performed according to the departmental dose-optimization program which includes automated exposure control, adjustment of the mA and/or kV according to patient size and/or use of iterative reconstruction technique. COMPARISON:  MRI and CT head 05/05/2023. FINDINGS: Brain: Redemonstrated heterogeneous right frontal mass with suspected mild improvement in surrounding edema and mass effect. Approximately 5 mm of leftward midline shift. No evidence of mass occupying acute hemorrhage, acute large  vascular territory infarct or hydrocephalus. Vascular: No hyperdense vessel. Skull: No acute fracture. Sinuses/Orbits: Left the clear sinuses.  No acute orbital findings. Other: No mastoid effusions. IMPRESSION: Redemonstrated heterogeneous right frontal mass with suspected mild improvement in surrounding edema and mass effect. Approximately 5 mm of leftward midline shift. Electronically Signed   By: Feliberto Harts M.D.   On: 05/12/2023 14:33   CT CHEST ABDOMEN PELVIS W CONTRAST  Result Date: 05/06/2023 CLINICAL DATA:  Brain malignancy, primary search * Tracking Code: BO * EXAM: CT CHEST, ABDOMEN, AND PELVIS WITH CONTRAST TECHNIQUE: Multidetector CT imaging of the chest, abdomen and pelvis was performed following the standard protocol during bolus administration of intravenous contrast. RADIATION DOSE REDUCTION: This exam was performed according to the departmental dose-optimization program which includes automated exposure control, adjustment of the mA and/or kV according to patient size and/or use of iterative reconstruction technique. CONTRAST:  75mL OMNIPAQUE IOHEXOL 350 MG/ML SOLN COMPARISON:  None Available. FINDINGS: CT CHEST FINDINGS Cardiovascular: Aortic atherosclerosis. Normal heart size. Extensive three-vessel coronary artery calcifications and stents. No pericardial effusion. Mediastinum/Nodes: No enlarged mediastinal, hilar, or axillary lymph nodes. Thyroid gland, trachea, and esophagus demonstrate no significant findings. Lungs/Pleura: Bandlike scarring of the bilateral lung bases. No pleural effusion or pneumothorax. Musculoskeletal: No chest wall abnormality. No acute osseous findings. CT ABDOMEN PELVIS FINDINGS Hepatobiliary: No solid liver abnormality is seen. Calcified sludge and gallstones in the gallbladder. No gallbladder wall thickening, or biliary dilatation. Pancreas: Unremarkable. No pancreatic ductal dilatation or surrounding inflammatory changes. Spleen: Normal in size without  significant abnormality. Benign cyst of the superior spleen, requiring no further follow-up or characterization. Adrenals/Urinary Tract: Adrenal glands are unremarkable. Nonobstructive calculi of the inferior pole of the left kidney. No right-sided calculi, ureteral calculi bladder is unremarkable. Stomach/Bowel: Stomach is within normal limits. Appendix appears normal. No evidence of bowel wall thickening, distention, or inflammatory changes. Descending and sigmoid diverticulosis. Vascular/Lymphatic:  Aortic atherosclerosis. No enlarged abdominal or pelvic lymph nodes. Reproductive: Prostatomegaly Other: No abdominal wall hernia or abnormality. No ascites. Musculoskeletal: No acute osseous findings. IMPRESSION: 1. No evidence of primary neoplasm or disease in the chest, abdomen, or pelvis. 2. Cholelithiasis 3. Nonobstructive left nephrolithiasis. 4. Descending and sigmoid diverticulosis without evidence of acute diverticulitis. 5. Prostatomegaly. 6. Coronary artery disease. Aortic Atherosclerosis (ICD10-I70.0). Electronically Signed   By: Jearld Lesch M.D.   On: 05/06/2023 16:37   MR Brain W and Wo Contrast  Result Date: 05/05/2023 CLINICAL DATA:  CNS neoplasm monitoring EXAM: MRI HEAD WITHOUT AND WITH CONTRAST TECHNIQUE: Multiplanar, multiecho pulse sequences of the brain and surrounding structures were obtained without and with intravenous contrast. CONTRAST:  9mL GADAVIST GADOBUTROL 1 MMOL/ML IV SOLN COMPARISON:  05/05/2023 CTA head neck FINDINGS: Brain: There is a large, predominantly peripherally contrast-enhancing mass located within the right frontal lobe, measuring 6.8 x 4.9 cm. There is a large amount of surrounding hyperintense T2-weighted signal. There is marked mass effect on the right lateral ventricle and leftward midline shift 6 mm. There is early subfalcine herniation of the cingulate gyrus. No acute hemorrhage. The central necrotic component of the mass does not restrict diffusion. Vascular:  Major flow voids are preserved. Skull and upper cervical spine: Normal calvarium and skull base. Visualized upper cervical spine and soft tissues are normal. Sinuses/Orbits:No paranasal sinus fluid levels or advanced mucosal thickening. No mastoid or middle ear effusion. Normal orbits. IMPRESSION: 1. Large, predominantly peripherally contrast-enhancing mass located within the right frontal lobe, measuring 6.8 x 4.9 cm. Large amount of surrounding hyperintense T2-weighted signal. This is most consistent with a high-grade glioma. 2. Marked mass effect on the right lateral ventricle and 6 mm leftward midline shift. Early subfalcine herniation of the cingulate gyrus. Electronically Signed   By: Deatra Robinson M.D.   On: 05/05/2023 22:39   CT ANGIO HEAD NECK W WO CM  Result Date: 05/05/2023 CLINICAL DATA:  Fall.  Left-sided weakness. EXAM: CT CERVICAL SPINE WITHOUT CONTRAST CT ANGIOGRAPHY HEAD AND NECK WITH AND WITHOUT CONTRAST TECHNIQUE: Multidetector CT imaging of the cervical spine was performed without intravenous contrast. Multiplanar CT image reconstructions were also generated. Multidetector CT imaging of the head and neck was performed using the standard protocol during bolus administration of intravenous contrast. Multiplanar CT image reconstructions and MIPs were obtained to evaluate the vascular anatomy. Carotid stenosis measurements (when applicable) are obtained utilizing NASCET criteria, using the distal internal carotid diameter as the denominator. RADIATION DOSE REDUCTION: This exam was performed according to the departmental dose-optimization program which includes automated exposure control, adjustment of the mA and/or kV according to patient size and/or use of iterative reconstruction technique. CONTRAST:  75mL OMNIPAQUE IOHEXOL 350 MG/ML SOLN COMPARISON:  None Available. FINDINGS: CT HEAD FINDINGS Brain: There is a large 5.2 x 4.2 x 5.2 cm peripherally contrast-enhancing mass centered in the right  frontal lobe. This mass is likely centrally necrotic. There is vasogenic edema surrounding this lesion with proximally 6 mm leftward midline shift. No hydrocephalus. No extra-axial fluid collection. No CT evidence of an acute cortical infarct. Vascular: No hyperdense vessel or unexpected calcification. Skull: Normal. Negative for fracture or focal lesion. Sinuses/Orbits: No middle ear or mastoid effusion. Paranasal sinuses are clear. Orbits are unremarkable. Other: None. Review of the MIP images confirms the above findings CT CERVICAL SPINE FINDINGS: Alignment: Grade 1 anterolisthesis of C3 on C4 and C4 on C5. Skull base and vertebrae: No acute fracture. No primary bone lesion or focal  pathologic process. Assessment of the lower cervical spine is limited due to a combination of streak artifact and osteopenia. Soft tissues and spinal canal: No prevertebral fluid or swelling. No visible canal hematoma. Disc levels:  No evidence of high-grade spinal canal stenosis. Upper chest: Negative. Other: None CTA NECK FINDINGS Aortic arch: Standard branching. Imaged portion shows no evidence of aneurysm or dissection. No significant stenosis of the major arch vessel origins. Right carotid system: No evidence of dissection, stenosis (50% or greater), or occlusion. Left carotid system: No evidence of dissection, stenosis (50% or greater), or occlusion. Vertebral arteries: Moderate to severe narrowing of the origin of the right vertebral artery. Moderate to severe narrowing in the V4 segment of the right vertebral artery. Mild narrowing of the origin of the left vertebral artery. No evidence of a dissection. Codominant. Skeleton: See above Other neck: Negative. Upper chest: Negative. Review of the MIP images confirms the above findings CTA HEAD FINDINGS Anterior circulation: No significant stenosis, proximal occlusion, aneurysm, or vascular malformation. Posterior circulation: Mild narrowing of the P1 P2 junction of the left PCA.  Otherwise no proximal occlusion, aneurysm, or vascular malformation. Venous sinuses: As permitted by contrast timing, patent. Anatomic variants: None Review of the MIP images confirms the above findings IMPRESSION: 1. Large 5.2 x 4.2 x 5.2 cm peripherally contrast-enhancing mass centered in the right frontal lobe with surrounding vasogenic edema and approximately 6 mm leftward midline shift. This could either represent a solitary metastatic lesion or a high grade glial neoplasm. Recommend brain MRI with and without contrast for further evaluation. 2. No acute fracture or traumatic listhesis of the cervical spine. 3. Moderate to severe narrowing of the origin of the right vertebral artery and V4 segment of the right vertebral artery. Electronically Signed   By: Lorenza Cambridge M.D.   On: 05/05/2023 16:26   CT C-SPINE NO CHARGE  Result Date: 05/05/2023 CLINICAL DATA:  Fall.  Left-sided weakness. EXAM: CT CERVICAL SPINE WITHOUT CONTRAST CT ANGIOGRAPHY HEAD AND NECK WITH AND WITHOUT CONTRAST TECHNIQUE: Multidetector CT imaging of the cervical spine was performed without intravenous contrast. Multiplanar CT image reconstructions were also generated. Multidetector CT imaging of the head and neck was performed using the standard protocol during bolus administration of intravenous contrast. Multiplanar CT image reconstructions and MIPs were obtained to evaluate the vascular anatomy. Carotid stenosis measurements (when applicable) are obtained utilizing NASCET criteria, using the distal internal carotid diameter as the denominator. RADIATION DOSE REDUCTION: This exam was performed according to the departmental dose-optimization program which includes automated exposure control, adjustment of the mA and/or kV according to patient size and/or use of iterative reconstruction technique. CONTRAST:  75mL OMNIPAQUE IOHEXOL 350 MG/ML SOLN COMPARISON:  None Available. FINDINGS: CT HEAD FINDINGS Brain: There is a large 5.2 x 4.2 x  5.2 cm peripherally contrast-enhancing mass centered in the right frontal lobe. This mass is likely centrally necrotic. There is vasogenic edema surrounding this lesion with proximally 6 mm leftward midline shift. No hydrocephalus. No extra-axial fluid collection. No CT evidence of an acute cortical infarct. Vascular: No hyperdense vessel or unexpected calcification. Skull: Normal. Negative for fracture or focal lesion. Sinuses/Orbits: No middle ear or mastoid effusion. Paranasal sinuses are clear. Orbits are unremarkable. Other: None. Review of the MIP images confirms the above findings CT CERVICAL SPINE FINDINGS: Alignment: Grade 1 anterolisthesis of C3 on C4 and C4 on C5. Skull base and vertebrae: No acute fracture. No primary bone lesion or focal pathologic process. Assessment of the lower cervical  spine is limited due to a combination of streak artifact and osteopenia. Soft tissues and spinal canal: No prevertebral fluid or swelling. No visible canal hematoma. Disc levels:  No evidence of high-grade spinal canal stenosis. Upper chest: Negative. Other: None CTA NECK FINDINGS Aortic arch: Standard branching. Imaged portion shows no evidence of aneurysm or dissection. No significant stenosis of the major arch vessel origins. Right carotid system: No evidence of dissection, stenosis (50% or greater), or occlusion. Left carotid system: No evidence of dissection, stenosis (50% or greater), or occlusion. Vertebral arteries: Moderate to severe narrowing of the origin of the right vertebral artery. Moderate to severe narrowing in the V4 segment of the right vertebral artery. Mild narrowing of the origin of the left vertebral artery. No evidence of a dissection. Codominant. Skeleton: See above Other neck: Negative. Upper chest: Negative. Review of the MIP images confirms the above findings CTA HEAD FINDINGS Anterior circulation: No significant stenosis, proximal occlusion, aneurysm, or vascular malformation. Posterior  circulation: Mild narrowing of the P1 P2 junction of the left PCA. Otherwise no proximal occlusion, aneurysm, or vascular malformation. Venous sinuses: As permitted by contrast timing, patent. Anatomic variants: None Review of the MIP images confirms the above findings IMPRESSION: 1. Large 5.2 x 4.2 x 5.2 cm peripherally contrast-enhancing mass centered in the right frontal lobe with surrounding vasogenic edema and approximately 6 mm leftward midline shift. This could either represent a solitary metastatic lesion or a high grade glial neoplasm. Recommend brain MRI with and without contrast for further evaluation. 2. No acute fracture or traumatic listhesis of the cervical spine. 3. Moderate to severe narrowing of the origin of the right vertebral artery and V4 segment of the right vertebral artery. Electronically Signed   By: Lorenza Cambridge M.D.   On: 05/05/2023 16:26   DG Chest Portable 1 View  Result Date: 05/05/2023 CLINICAL DATA:  Chest pain EXAM: PORTABLE CHEST 1 VIEW COMPARISON:  None Available. FINDINGS: Slight linear opacity left lung base likely scar or atelectasis. No consolidation, pneumothorax or effusion. No edema. Calcified aorta. Overlapping cardiac leads. IMPRESSION: Minimal left basilar atelectasis or scar. No acute cardiopulmonary disease Electronically Signed   By: Karen Kays M.D.   On: 05/05/2023 14:56    Pathology: FINAL MICROSCOPIC DIAGNOSIS:  A. 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  See comment.  COMMENT:  This case will be sent for an expert consult and results will be reported in an addendum.  GROSS DESCRIPTION:  Received fresh is a 5.2 x 4.5 x 3.2 cm portion of soft tissue clinically identified as "right frontal brain tumor".  One surface is smooth and glistening.  Cut surfaces vary from gray-white to pale yellow to pink-red, and are centrally friable. Representative sections are submitted in 5  blocks.  SW 05/13/2023  Final Diagnosis performed by Clifton James, MD.   Electronically signed 05/14/2023 Technical component performed at [facility]. [facility], [address] [place], [place] [address].  Professional component performed at [facility], [address], [place], [place] [address].  Immunohistochemistry Technical component (if applicable) was performed at [facility]. [address], [place], [place] [address].   IMMUNOHISTOCHEMISTRY DISCLAIMER (if applicable): Some of these immunohistochemical stains may have been developed and the performance characteristics determine by [facility]. Some may not have been cleared or approved by the U.S. Food and Drug Administration. The FDA has determined that such clearance or approval is not  necessary. This test is used for clinical purposes. It should not be regarded as investigational or for research. This laboratory is certified under the Clinical Laboratory Improvement Amendments of 1988 (CLIA-88) as qualified to perform high complexity clinical laboratory testing.  The controls stained appropriately.   IHC stains are performed on formalin fixed, paraffin embedded tissue using a 3,3"diaminobenzidine (DAB) chromogen and Leica Bond Autostainer System. The staining intensity of the nucleus is score manually and is reported as the percentage of tumor cell nuclei demonstrating specific nuclear staining. The specimens are fixed in 10% Neutral Formalin for at least 6 hours and up to 72hrs. These tests are validated on decalcified tissue. Results should be interpreted with caution given the possibility of false negative results on decalcified specimens. Antibody Clones are as follows ER-clone 21F, PR-clone 16, Ki67- clone MM1. Some of these immunohistochemical stains may have been developed and the performance characteristics determined by [facility].  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  See scanned report for additional details.  Addendum #1 performed by Clifton James, MD.   Electronically signed 05/27/2023 Technical component performed at [employer]. [facility], [address] [place], [place] [address].    Assessment/Plan Glioblastoma, IDH-wildtype (HCC)  We appreciate the opportunity to participate in the care of [employer].  He is clinically stable and doing well following craniotomy.  We had an extensive conversation with him and his wife regarding pathology, prognosis, and available treatment pathways.  We are encouraged by his good quality resection, good functional status.  His age will be a limitation.  We discussed 6 weeks vs 3 weeks RT.  We ultimately recommended proceeding with course of intensity modulated radiation therapy and concurrent daily Temozolomide.  Radiation will be administered Mon-Fri over 6 weeks, Temodar will be dosed at 75mg /m2 to be given daily over 42 days.  We reviewed side effects of temodar, including fatigue, nausea/vomiting, constipation, and cytopenias.  Informed consent was verbally obtained at bedside to proceed with oral chemotherapy.  Chemotherapy should be held for the following:  ANC less than 1,000  Platelets less than 100,000  LFT or creatinine greater than 2x ULN  If clinical concerns/contraindications develop  Every 2 weeks during radiation, labs will be checked accompanied by a clinical evaluation in the brain tumor clinic.  Screening for potential clinical trials was performed and discussed using eligibility criteria for active protocols at [facility], loco-regional tertiary centers, as well as national database available on GroundTransfer.[employer].    The patient is not a candidate for a research protocol at this time due to patient preference.   We also discussed and patient consented  for additional tumor profiling and sequencing through [facility].  Advanced tumor profiling could help identify actionable mutation for targeted therapy and lead to direct clinical benefit.     We spent twenty additional minutes teaching regarding the natural history, biology, and historical experience in the treatment of brain tumors. We then discussed in detail the current recommendations for therapy focusing on the mode of administration, mechanism of action, anticipated toxicities, and quality of life issues associated with this plan. We also provided teaching sheets for the patient to take home as an additional resource.  All questions were answered. The patient knows to call the clinic with any problems, questions or concerns. No barriers to learning were detected.  The total time spent in the encounter was 60 minutes and more than  50% was on counseling and review of test results   Henreitta Leber, MD Medical Director of Neuro-Oncology [facility] [place] 06/02/23 10:13 AM

## 2023-06-02 NOTE — Progress Notes (Signed)
Location/Histology of Brain Tumor: Glioblastoma 05-12-23 FINAL MICROSCOPIC DIAGNOSIS:   A. 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  See  comment.   CT DBS HEAD W/O CONTRAST 05/12/2023  IMPRESSION: Redemonstrated heterogeneous right frontal mass with suspected mild improvement in surrounding edema and mass effect. Approximately 5 mm of leftward midline shift.  05/13/2023 MR BRAIN W WO CONTRAST  FINDINGS: Brain: Interval debulking of a right frontal lesion appears larger section cavity with expected postoperative blood products in the resection cavity as well as overlying extra-axial spaces. Small to moderate volume of pneumocephalus antidependently along the frontal convexities anteriorly. Surrounding T2/FLAIR hyperintensities is mildly improved and mass effect is mildly improved. Leftward midline shift now measures approximately 4 mm anteriorly. No suspicious residual enhancement, although postoperative left limits assessment. Mm continued attention on follow-up.   No evidence of acute infarct or hydrocephalus.   Vascular: Major arterial flow voids are maintained at the skull base.   Skull and upper cervical spine: Normal marrow signal.   Sinuses/Orbits: Clear sinuses.  No acute orbital findings.   Other: No mastoid effusions.   IMPRESSION: Expected postoperative findings after debulking of a large right frontal mass, detailed above.  Patient presented with symptoms of:  Alex Church presented to neurologic attention in late August with several days history of left sided weakness, confusion.  Wife noticed he had difficulty getting out of bed, was dragging left side.  This was on top of several months history of progressive confusion, short term memory impairment.  CNS imaging demonstrated a large right frontal mass.  Past or anticipated interventions, if any, per neurosurgery:  He underwent craniotomy and  resection with Dr. Jake Samples, path demonstrated glioblastoma.  Past or anticipated interventions, if any, per medical oncology:  Henreitta Leber, MD 06-02-23 Oncology History  Glioblastoma, IDH-wildtype [facility])  05/12/2023 Surgery    Right frontal craniotomy, resection with Dr. Jake Samples; path is glioblastoma IDH-wt    05/15/2023 Initial Diagnosis    Glioblastoma, IDH-wildtype (HCC)  History of Present Illness: The patient's records from the referring physician were obtained and reviewed and the patient interviewed to confirm this HPI.  Alex Church presented to neurologic attention in late August with several days history of left sided weakness, confusion.  Wife noticed he had difficulty getting out of bed, was dragging left side.  This was on top of several months history of progressive confusion, short term memory impairment.  CNS imaging demonstrated a large right frontal mass.  He underwent craniotomy and resection with Dr. Jake Samples, path demonstrated glioblastoma.  Since surgery he has been doing well, walking independently.  Denies seizures, headaches.  He is off decadron.  Assessment/Plan Glioblastoma, IDH-wildtype (HCC)   We appreciate the opportunity to participate in the care of [employer].  He is clinically stable and doing well following craniotomy.   We had an extensive conversation with him and his wife regarding pathology, prognosis, and available treatment pathways.  We are encouraged by his good quality resection, good functional status.  His age will be a limitation.  We discussed 6 weeks vs 3 weeks RT.   We ultimately recommended proceeding with course of intensity modulated radiation therapy and concurrent daily Temozolomide.  Radiation will be administered Mon-Fri over 6 weeks, Temodar will be dosed at 75mg /m2 to be given daily over 42 days.  We reviewed side effects of temodar, including fatigue, nausea/vomiting, constipation, and cytopenias.   Informed consent was verbally  obtained at bedside  to proceed with oral chemotherapy.  Chemotherapy should be held for the following:  ANC less than 1,000  Platelets less than 100,000  LFT or creatinine greater than 2x ULN  If clinical concerns/contraindications develop   Every 2 weeks during radiation, labs will be checked accompanied by a clinical evaluation in the brain tumor clinic.   Screening for potential clinical trials was performed and discussed using eligibility criteria for active protocols at [facility], loco-regional tertiary centers, as well as national database available on [employer].     The patient is not a candidate for a research protocol at this time due to patient preference.    We also discussed and patient consented for additional tumor profiling and sequencing through [facility].  Advanced tumor profiling could help identify actionable mutation for targeted therapy and lead to direct clinical benefit.     Dose of Decadron, if applicable: no  Recent neurologic symptoms, if any:  Seizures: {:18581} Headaches: {:18581} Nausea: {:18581} Dizziness/ataxia: {:18581} Difficulty with hand coordination: {:18581} Focal numbness/weakness: {:18581} Visual deficits/changes: {:18581} Confusion/Memory deficits: {:18581}  Painful bone metastases at present, if any: {:18581}  SAFETY ISSUES: Prior radiation? {:18581} Pacemaker/ICD? {:18581} Possible current pregnancy? no Is the patient on methotrexate? no  Additional Complaints / other details: ***

## 2023-06-02 NOTE — Progress Notes (Signed)

## 2023-06-03 ENCOUNTER — Other Ambulatory Visit: Payer: Self-pay

## 2023-06-03 ENCOUNTER — Telehealth: Payer: Self-pay | Admitting: Internal Medicine

## 2023-06-03 ENCOUNTER — Other Ambulatory Visit: Payer: Self-pay | Admitting: Pharmacy Technician

## 2023-06-03 ENCOUNTER — Other Ambulatory Visit (HOSPITAL_COMMUNITY): Payer: Self-pay

## 2023-06-03 NOTE — Progress Notes (Signed)
Specialty Pharmacy Initial Fill Coordination Note  Alex Church is a 77 y.o. male contacted today regarding initial fill of specialty medication(s) Temozolomide .  Patient requested [name] at [facility] [place]  on 06/04/23   Medication will be filled on 06/03/23.   Patient is aware of $150.21 copayment.  Fill will be for full 42 days of therapy. No refills needed.

## 2023-06-03 NOTE — Telephone Encounter (Signed)
Pt's spouse called to schedule his Hospital follow up appt. She stated that she was unaware this was supposed to be scheduled within 10 days post discharge. She asked if I could send a message back to see if the pt can be squeezed in, in early October, on one of the days Dr. Drue Novel will be in office. Please call & advise.

## 2023-06-03 NOTE — Progress Notes (Signed)
Oral Oncology Pharmacist Encounter   Patient counseling performed in encounter from 06/02/2023.  Bethel Born, PharmD Hematology/Oncology Clinical Pharmacist [facility] [phone]

## 2023-06-03 NOTE — Progress Notes (Signed)
Oral Oncology Patient Advocate Encounter  After completing a benefits investigation, prior authorization for temozolomide is not required at this time through [facility].  Patient's copay is $150.21.     Jinger Neighbors, CPhT-Adv Oncology Pharmacy Patient Advocate [facility] Direct Number: [phone]  Fax: [phone]

## 2023-06-03 NOTE — Telephone Encounter (Signed)
Unfortunately PCP is out of office until 10/7 and is booked out, PCP wanted to get Pt scheduled with another provider for his hosp f/u while he was out. Dr. Patsy Lager or Dr. Carmelia Roller maybe a good fit.

## 2023-06-03 NOTE — Telephone Encounter (Addendum)
Addendum: patient to start radiation on 10/10. Patient will start Temodar the night before on 10/9.  Oral Chemotherapy Pharmacist Encounter  I spoke with patient and patients wife for overview of: Temodar for the treatment of glioblastoma multiforme in conjunction with radiation, planned duration concomitant phase 42 days of therapy.  Patient will likely continue on Temodar for maintenance treatment for 6-12 cycles after completion of concomitant phase.  Counseled patient on administration, dosing, side effects, monitoring, drug-food interactions, safe handling, storage, and disposal.  Patient will take Temodar 1 capsule (140mg ) by mouth once daily, may take at bedtime and on an empty stomach to decrease nausea and vomiting.  Patient will take Temodar concurrent with radiation for 42 days straight.  Temodar and radiation start date: pending radiation consult on 06/05/23  Adverse effects include but are not limited to: nausea, vomiting, anorexia, GI upset, rash, drug fever, and fatigue.  Rare but serious adverse effects of pneumocystis pneumonia and secondary malignancy also discussed. PCP prophylaxis will not be initiated at this time, but may be added based on lymphocyte count in the future.  Prophylactic Zofran will not be used at initiation of concurrent phase, but will be initiated if nausea develops despite Temodar administration on an empty stomach and at bedtime. If this occurs, patient will take Zofran 8 mg tablet, 1 tablet by mouth 30-60 min prior to Temodar dose to help decrease N/V   Reviewed with patient importance of keeping a medication schedule and plan for any missed doses. No barriers to medication adherence identified.  Medication reconciliation performed and medication/allergy list updated.  Insurance authorization for Temodar has been obtained. Patients wife will pick up the medication from [facility] on 06/04/2023.l   Patient informed the  prescription will be for the full 42 days of therapy.   All questions answered.  Patient voiced understanding and appreciation.   Medication education handout placed in mail for patient. Patient knows to call the office with questions or concerns. Oral Chemotherapy Clinic phone number provided to patient.   Bethel Born, PharmD Hematology/Oncology Clinical Pharmacist [facility] Oral Chemotherapy Navigation Clinic [phone] 06/03/2023  9:50 AM

## 2023-06-04 ENCOUNTER — Other Ambulatory Visit: Payer: Self-pay

## 2023-06-04 ENCOUNTER — Other Ambulatory Visit (HOSPITAL_COMMUNITY): Payer: Self-pay

## 2023-06-04 ENCOUNTER — Encounter: Payer: Self-pay | Admitting: Radiation Oncology

## 2023-06-04 ENCOUNTER — Telehealth: Payer: Self-pay | Admitting: *Deleted

## 2023-06-04 ENCOUNTER — Telehealth: Payer: Self-pay

## 2023-06-04 NOTE — Progress Notes (Signed)
Radiation Oncology         [phone] ________________________________  Initial Outpatient telemedicine consultation - MyChart Video was not available and therefore this was a telephone visit   Name: XZADRIAN MILHOAN MRN: [id number]  Date: 06/05/2023  DOB: 1946/05/26  CC:Paz, Nolon Rod, MD  Barbaraann Cao Georgeanna Lea, MD   REFERRING PHYSICIAN: Henreitta Leber, MD  DIAGNOSIS:    ICD-10-CM   1. Glioblastoma of frontal lobe (HCC)  C71.1     2. 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 male who presented to his PCP this past August a several month history of progressive confusion and short term memory impairment. He also reported a several day history of left sided weakness, and a recent fall secondary to his left sided weakness.   He soon after presented to the ED on 05/05/23 with a history of worsening of left-sided weakness x 4 days. CTA of the head and neck revealed a large 5.2 cm peripherally contrast enhancing mass centered in the right frontal lobe with surrounding vasogenic edema and an approximately 6 mm leftward midline shift. Imaging also showed moderate to severe narrowing of the origin of the right vertebral artery and V4 segment of the right vertebral artery.   An MRI of the brain was subsequently performed which redemonstrated the large right frontal lobe mass, measuring 6.8 cm, with a large amount of surrounding hyperintense T2-weighted signal, and features most consistent with a high-grade glioma. MRI also showed marked mass effect on the right lateral ventricle, a 6 mm leftward midline shift, and early subfalcine herniation of the cingulate gyrus.  Given his recent fall, a CT of the cervical spine was also performed which showed no acute fracture or traumatic listhesis of the cervical  spine.  He was accordingly admitted and underwent a right stereotactic frontal craniotomy for resection of the right frontal lobe tumor on 05/12/23 under the care of Dr. Jake Samples. Pathology from the procedure showed findings consistent with a neoplastic lesion concerning for high-grade glial neoplasm. Pathology was sent to [facility] for further classification. The final diagnosis rendered at [facility] came back showing: WHO grade 4 glioblastoma, IDH wild-type.   Post-op MRI of the brain on 09/04 showed expected postoperative changes s/p tumor resection.   The remainder of his hospital course consisted of Decadron, PPI for GI prophylaxis, and Keppra BID. He was discharged to rehab on 05/15/23 on a Decadron taper per Neurosurgery (completed while in rehab). He was discharged from inpatient rehab on 09/14 with a near complete return to baseline functional status. Other imaging performed while inpatient included a CT AP performed upon admission on 05/06/23 for evaluation of nausea and vomiting which was unremarkable.  Since being discharged, the patient established care with Dr. Barbaraann Cao on 06/02/23 for further management and to discuss treatment options. Dr. Barbaraann Cao has ultimately recommended a course of intensity modulated radiation therapy and concurrent daily Temozolomide (with radiation administered Mon-Fri over 6 weeks, and Temodar to be dosed at 75mg /m2 given daily over 42 days).    PREVIOUS RADIATION THERAPY: No  PAST MEDICAL HISTORY:  has a past medical history of B12 deficiency, Diabetes mellitus, Glaucoma suspect, Hyperlipemia, and Hypertension.    PAST SURGICAL HISTORY: Past Surgical History:  Procedure Laterality Date   APPLICATION OF  CRANIAL NAVIGATION Right 05/12/2023   Procedure: APPLICATION OF CRANIAL NAVIGATION;  Surgeon: Dawley, Alan Mulder, DO;  Location: [facility] OR;  Service: Neurosurgery;  Laterality: Right;   CRANIOTOMY Right 05/12/2023   Procedure: RIGHT STERIOSTACTIC FRONTAL CRANIOTOMY  FOR TUMOR RESECTION;  Surgeon: Bethann Goo, DO;  Location: [facility] OR;  Service: Neurosurgery;  Laterality: Right;   NO PAST SURGERIES      FAMILY HISTORY: family history includes Cancer in his mother; Cirrhosis in his cousin; Diabetes in his cousin and maternal uncle; Heart attack (age of onset: 80) in his brother.  SOCIAL HISTORY:  reports that he has never smoked. He has never used smokeless tobacco. He reports that he does not drink alcohol and does not use drugs.  ALLERGIES: Patient has no known allergies.  MEDICATIONS:  Current Outpatient Medications  Medication Sig Dispense Refill   acetaminophen (TYLENOL) 325 MG tablet Take 1-2 tablets (325-650 mg total) by mouth every 4 (four) hours as needed for mild pain.     allopurinol (ZYLOPRIM) 100 MG tablet Take 1 tablet (100 mg total) by mouth 2 (two) times daily. 180 tablet 1   atorvastatin (LIPITOR) 10 MG tablet Take 1 tablet (10 mg total) by mouth at bedtime. Due for appt 05/2023 90 tablet 0   Blood Glucose Monitoring Suppl (ONETOUCH VERIO FLEX SYSTEM) w/Device KIT Check blood sugar once daily 1 kit 0   carvedilol (COREG) 12.5 MG tablet Take 0.5 tablets (6.25 mg total) by mouth 2 (two) times daily with a meal. Due for appt 05/2023     cephALEXin (KEFLEX) 500 MG capsule Take 1 capsule (500 mg total) by mouth 4 (four) times daily. 28 capsule 0   cloNIDine (CATAPRES) 0.1 MG tablet Take 1 tablet (0.1 mg total) by mouth 2 (two) times daily. 60 tablet 0   cyanocobalamin (VITAMIN B12) 1000 MCG tablet Take 1,000 mcg by mouth every other day.     glucose blood test strip Check blood sugars once daily 100 each 12   insulin glargine (LANTUS) 100 UNIT/ML Solostar Pen Inject 6 Units into the skin daily. Expires 28 days after first use 15 mL 0   Insulin Pen Needle (AUM PEN NEEDLE) 32G X 4 MM MISC Use 4 (four) times daily -  before meals and at bedtime. 100 each 0   Lancets (ONETOUCH ULTRASOFT) lancets Check bloods sugars once daily 100 each 12    levETIRAcetam (KEPPRA) 500 MG tablet Take 1 tablet (500 mg total) by mouth 2 (two) times daily. 60 tablet 0   Magnesium Oxide 400 MG CAPS Take 2 capsules (800 mg total) by mouth 2 (two) times daily. 60 capsule 0   melatonin 5 MG TABS Take 1 tablet (5 mg total) by mouth at bedtime. 30 tablet 0   metFORMIN (GLUCOPHAGE) 1000 MG tablet Take 0.5 tablets (500 mg total) by mouth 2 (two) times daily with a meal.     [START ON 06/09/2023] ondansetron (ZOFRAN) 8 MG tablet Take 1 tablet (8 mg total) by mouth every 8 (eight) hours as needed for nausea or vomiting. May take 30-60 minutes prior to Temodar administration if nausea/vomiting occurs as needed. 30 tablet 1   pantoprazole (PROTONIX) 40 MG tablet Take 1 tablet (40 mg total) by mouth 2 (two) times daily. 60 tablet 0   pioglitazone (ACTOS) 30 MG tablet Take 1 tablet (30 mg total) by mouth daily.     polyethylene glycol (MIRALAX / GLYCOLAX) 17 g packet Take 17 g by mouth daily as needed for mild  constipation. 14 each 0   sitaGLIPtin (JANUVIA) 100 MG tablet Take 1 tablet (100 mg total) by mouth daily.     sodium chloride 1 g tablet Take 1 tablet (1 g total) by mouth 3 (three) times daily with meals. 90 tablet 0   [START ON 06/09/2023] temozolomide (TEMODAR) 140 MG capsule Take 1 capsule (140 mg total) by mouth at bedtime. May take on an empty stomach to decrease nausea & vomiting. 42 capsule 0   No current facility-administered medications for this encounter.    REVIEW OF SYSTEMS:  Notable for that above.   PHYSICAL EXAM:  vitals were not taken for this visit.   General:  in no acute distress       LABORATORY DATA:  Lab Results  Component Value Date   WBC 19.1 (H) 05/22/2023   HGB 13.0 05/22/2023   HCT 40.0 05/22/2023   MCV 91.7 05/22/2023   PLT 215 05/22/2023   CMP     Component Value Date/Time   NA 132 (L) 05/23/2023 0717   NA 139 04/06/2022 1047   K 4.2 05/23/2023 0717   CL 99 05/23/2023 0717   CO2 24 05/23/2023 0717   GLUCOSE 130  (H) 05/23/2023 0717   GLUCOSE 147 (H) 08/26/2006 1051   BUN 21 05/23/2023 0717   BUN 17 04/06/2022 1047   CREATININE 1.08 05/23/2023 0717   CREATININE 1.09 06/20/2020 1509   CALCIUM 7.9 (L) 05/23/2023 0717   PROT 4.9 (L) 05/16/2023 0444   ALBUMIN 1.9 (L) 05/16/2023 0444   AST 17 05/16/2023 0444   ALT 29 05/16/2023 0444   ALKPHOS 51 05/16/2023 0444   BILITOT 0.9 05/16/2023 0444   GFR 53.79 (L) 01/19/2023 1601   EGFR 62 04/06/2022 1047   GFRNONAA >60 05/23/2023 0717         RADIOGRAPHY: MR BRAIN W WO CONTRAST  Result Date: 05/13/2023 CLINICAL DATA:  Craniotomy, post-op EXAM: MRI HEAD WITHOUT AND WITH CONTRAST TECHNIQUE: Multiplanar, multiecho pulse sequences of the brain and surrounding structures were obtained without and with intravenous contrast. CONTRAST:  8mL GADAVIST GADOBUTROL 1 MMOL/ML IV SOLN COMPARISON:  MRI head 05/05/2023. FINDINGS: Brain: Interval debulking of a right frontal lesion appears larger section cavity with expected postoperative blood products in the resection cavity as well as overlying extra-axial spaces. Small to moderate volume of pneumocephalus antidependently along the frontal convexities anteriorly. Surrounding T2/FLAIR hyperintensities is mildly improved and mass effect is mildly improved. Leftward midline shift now measures approximately 4 mm anteriorly. No suspicious residual enhancement, although postoperative left limits assessment. Mm continued attention on follow-up. No evidence of acute infarct or hydrocephalus. Vascular: Major arterial flow voids are maintained at the skull base. Skull and upper cervical spine: Normal marrow signal. Sinuses/Orbits: Clear sinuses.  No acute orbital findings. Other: No mastoid effusions. IMPRESSION: Expected postoperative findings after debulking of a large right frontal mass, detailed above. Electronically Signed   By: Feliberto Harts M.D.   On: 05/13/2023 13:12   CT DBS HEAD W/O CONTRAST ( )  Result Date:  05/12/2023 CLINICAL DATA:  [id number] Malignant frontal lobe tumor (HCC) [id number] EXAM: CT HEAD WITHOUT CONTRAST TECHNIQUE: Contiguous axial images were obtained from the base of the skull through the vertex without intravenous contrast. RADIATION DOSE REDUCTION: This exam was performed according to the departmental dose-optimization program which includes automated exposure control, adjustment of the mA and/or kV according to patient size and/or use of iterative reconstruction technique. COMPARISON:  MRI and CT head 05/05/2023. FINDINGS: Brain: Redemonstrated heterogeneous right  frontal mass with suspected mild improvement in surrounding edema and mass effect. Approximately 5 mm of leftward midline shift. No evidence of mass occupying acute hemorrhage, acute large vascular territory infarct or hydrocephalus. Vascular: No hyperdense vessel. Skull: No acute fracture. Sinuses/Orbits: Left the clear sinuses.  No acute orbital findings. Other: No mastoid effusions. IMPRESSION: Redemonstrated heterogeneous right frontal mass with suspected mild improvement in surrounding edema and mass effect. Approximately 5 mm of leftward midline shift. Electronically Signed   By: Feliberto Harts M.D.   On: 05/12/2023 14:33      IMPRESSION/PLAN: Right frontal lobe glioblastoma   Today, I talked to the patient and his supportive wife about the findings and work-up thus far.  We discussed the patient's diagnosis of glioblastoma and general treatment for this, highlighting the role of adjuvant radiotherapy in the management.  We discussed the available radiation techniques, and focused on the details of logistics and delivery.   We spoke about hypofractionated versus standard fractionated radiation.  The patient would like to proceed with the standard 6-week course of intensity-modulated radiation therapy.  He has already received his temozolomide as prescribed by neuro-oncology.  We discussed the risks, benefits, and side effects of  radiotherapy. Side effects may include but not necessarily be limited to: Skin irritation to the scalp, patchy hair loss, fatigue, headache, dizziness, nausea in the acute setting.  We talked about the long-term risk of brain damage, brain injury, cataracts (which he already has actually), cognitive decline; we discussed how the potential risks of radiation are outweighed by potential benefits given the aggressiveness of his disease.  No guarantees of treatment were given. The patient was encouraged to ask questions that I answered to the best of my ability.    We will schedule him in the near future for CT simulation.  This encounter was provided by telemedicine platform; patient desired telemedicine during pandemic precautions.  MyChart video was not available and therefore telephone was used. The patient has given verbal consent for this type of encounter and has been advised to only accept a meeting of this type in a secure network environment. On date of service, in total, I spent 50 minutes on this encounter.   The attendants for this meeting include Lonie Peak  and [name] During the encounter, Lonie Peak was located at [facility] Radiation Oncology Department.  Doris Cheadle Rominger was located at home.     __________________________________________   Lonie Peak, MD  This document serves as a record of services personally performed by Lonie Peak, MD. It was created on her behalf by Neena Rhymes, a trained medical scribe. The creation of this record is based on the scribe's personal observations and the provider's statements to them. This document has been checked and approved by the attending provider.

## 2023-06-04 NOTE — Telephone Encounter (Signed)
Molecular Profiling Requisition along with pathology report, brain MRI report, copy of insurance card, & office visit note from 06/02/23 faxed to [employer].  Fax confirmation received.

## 2023-06-04 NOTE — Telephone Encounter (Signed)
Rn called pt family to obtain nurse evaluation information. Consult note completed and routed to Dr. Basilio Cairo.

## 2023-06-05 ENCOUNTER — Ambulatory Visit
Admission: RE | Admit: 2023-06-05 | Discharge: 2023-06-05 | Disposition: A | Discharge status: Home / Self Care | Payer: Medicare Other | Source: Ambulatory Visit | Attending: Radiation Oncology | Admitting: Radiation Oncology

## 2023-06-05 ENCOUNTER — Other Ambulatory Visit (HOSPITAL_COMMUNITY): Payer: Self-pay

## 2023-06-05 ENCOUNTER — Encounter: Payer: Self-pay | Admitting: Radiation Oncology

## 2023-06-05 DIAGNOSIS — E785 Hyperlipidemia, unspecified: Secondary | ICD-10-CM | POA: Insufficient documentation

## 2023-06-05 DIAGNOSIS — R531 Weakness: Secondary | ICD-10-CM | POA: Insufficient documentation

## 2023-06-05 DIAGNOSIS — Z7984 Long term (current) use of oral hypoglycemic drugs: Secondary | ICD-10-CM | POA: Insufficient documentation

## 2023-06-05 DIAGNOSIS — C711 Malignant neoplasm of frontal lobe: Secondary | ICD-10-CM | POA: Insufficient documentation

## 2023-06-05 DIAGNOSIS — I10 Essential (primary) hypertension: Secondary | ICD-10-CM | POA: Diagnosis not present

## 2023-06-05 DIAGNOSIS — Z79899 Other long term (current) drug therapy: Secondary | ICD-10-CM | POA: Diagnosis not present

## 2023-06-05 DIAGNOSIS — E538 Deficiency of other specified B group vitamins: Secondary | ICD-10-CM | POA: Insufficient documentation

## 2023-06-05 DIAGNOSIS — C719 Malignant neoplasm of brain, unspecified: Secondary | ICD-10-CM

## 2023-06-05 DIAGNOSIS — Z79630 Long term (current) use of alkylating agent: Secondary | ICD-10-CM | POA: Insufficient documentation

## 2023-06-05 DIAGNOSIS — Z809 Family history of malignant neoplasm, unspecified: Secondary | ICD-10-CM | POA: Insufficient documentation

## 2023-06-05 DIAGNOSIS — E119 Type 2 diabetes mellitus without complications: Secondary | ICD-10-CM | POA: Diagnosis not present

## 2023-06-09 ENCOUNTER — Telehealth: Payer: Self-pay

## 2023-06-09 ENCOUNTER — Other Ambulatory Visit: Payer: Self-pay

## 2023-06-09 ENCOUNTER — Encounter: Payer: Self-pay | Admitting: Internal Medicine

## 2023-06-09 NOTE — Telephone Encounter (Signed)
Spoke to patient's spouse yesterday informing her that the [employer] disability paperwork had been completed and faxed to the company. Fax conformation received. Copy of documents mailed to patient per request.

## 2023-06-10 ENCOUNTER — Encounter: Payer: Self-pay | Admitting: Internal Medicine

## 2023-06-10 ENCOUNTER — Ambulatory Visit (INDEPENDENT_AMBULATORY_CARE_PROVIDER_SITE_OTHER): Payer: Medicare Other | Admitting: Family Medicine

## 2023-06-10 ENCOUNTER — Other Ambulatory Visit: Payer: Self-pay

## 2023-06-10 ENCOUNTER — Ambulatory Visit
Admission: RE | Admit: 2023-06-10 | Discharge: 2023-06-10 | Disposition: A | Discharge status: Home / Self Care | Payer: Medicare Other | Source: Ambulatory Visit | Attending: Radiation Oncology | Admitting: Radiation Oncology

## 2023-06-10 ENCOUNTER — Encounter: Payer: Self-pay | Admitting: Family Medicine

## 2023-06-10 ENCOUNTER — Encounter (HOSPITAL_COMMUNITY): Payer: Self-pay | Admitting: Occupational Therapy

## 2023-06-10 ENCOUNTER — Ambulatory Visit (HOSPITAL_COMMUNITY): Payer: Medicare Other | Attending: Physical Medicine and Rehabilitation

## 2023-06-10 ENCOUNTER — Ambulatory Visit (HOSPITAL_COMMUNITY): Payer: Medicare Other | Admitting: Occupational Therapy

## 2023-06-10 VITALS — BP 118/65 | HR 87 | Temp 98.7°F | Ht 72.0 in | Wt 186.5 lb

## 2023-06-10 DIAGNOSIS — E871 Hypo-osmolality and hyponatremia: Secondary | ICD-10-CM

## 2023-06-10 DIAGNOSIS — E1165 Type 2 diabetes mellitus with hyperglycemia: Secondary | ICD-10-CM

## 2023-06-10 DIAGNOSIS — C719 Malignant neoplasm of brain, unspecified: Secondary | ICD-10-CM

## 2023-06-10 DIAGNOSIS — R262 Difficulty in walking, not elsewhere classified: Secondary | ICD-10-CM | POA: Diagnosis not present

## 2023-06-10 DIAGNOSIS — R29818 Other symptoms and signs involving the nervous system: Secondary | ICD-10-CM | POA: Insufficient documentation

## 2023-06-10 DIAGNOSIS — C711 Malignant neoplasm of frontal lobe: Secondary | ICD-10-CM | POA: Diagnosis not present

## 2023-06-10 DIAGNOSIS — R278 Other lack of coordination: Secondary | ICD-10-CM | POA: Diagnosis not present

## 2023-06-10 DIAGNOSIS — Z510 Encounter for antineoplastic radiation therapy: Secondary | ICD-10-CM | POA: Diagnosis not present

## 2023-06-10 DIAGNOSIS — R29898 Other symptoms and signs involving the musculoskeletal system: Secondary | ICD-10-CM | POA: Insufficient documentation

## 2023-06-10 DIAGNOSIS — Z7984 Long term (current) use of oral hypoglycemic drugs: Secondary | ICD-10-CM

## 2023-06-10 NOTE — Progress Notes (Signed)
Chief Complaint  Patient presents with   Hospitalization Follow-up    Subjective: Patient is a 77 y.o. male here for hospitalization follow-up.  He is here with his wife.  The patient was admitted to [facility] on 05/15/2023 and discharged on 05/23/2023 to subacute rehab and spent 18 days A.  He had a glioblastoma of the frontal lobe of the brain in addition to hyponatremia, hyperglycemia, and a UTI due to Proteus.  He has completed treatment for his UTI and is improving.  He is taking salt tablets and most recently his sodium has improved.  He is no longer on fluid restriction.  His balance is okay.  He was discharged in the hospital walker and is now only using a cane.  He is working with both occupational and physical therapy.  He is working with the oncology team as well.  His sugars have been under 200 random.  His wife and he are interested in knowing if he can go back on his regular Actos 30 mg daily, metformin 500 mg twice daily, and Januvia 100 mg daily.  He has been using insulin upon discharge from the hospital as his sugars were running higher as he was given Decadron.  He is no longer on steroids.  Past Medical History:  Diagnosis Date   B12 deficiency    monthly shots   Diabetes mellitus    Glaucoma suspect    Hyperlipemia    Hypertension     Objective: BP 118/65 (BP Location: Left Arm, Patient Position: Sitting, Cuff Size: Normal)   Pulse 87   Temp 98.7 F (37.1 C) (Oral)   Ht 6' (1.829 m)   Wt 186 lb 8 oz (84.6 kg)   SpO2 93%   BMI 25.29 kg/m  General: Awake, appears stated age Heart: RRR, no LE edema Lungs: CTAB, no rales, wheezes or rhonchi. No accessory muscle use Neuro: 5/5 strength throughout, DTRs equal and symmetric throughout, no clonus, no cerebellar signs, gait is cautious with the use of a cane. Eyes: PERRLA, sclera white, EOMI Abdomen: Bowel sounds present, soft, nontender, nondistended Psych: Age appropriate judgment and insight, normal affect and  mood  Assessment and Plan: Hyponatremia - Plan: Basic metabolic panel  Glioblastoma, IDH-wildtype (HCC)  Type 2 diabetes mellitus with hyperglycemia, without long-term current use of insulin (HCC)  Check levels today.  Unsure if he can stop his sodium supplement at this time.  May need to fluid restrict again. Appreciate specialty input.  Appears to be doing well for now. Okay to resume home Januvia 100 mg daily, Actos 30 mg daily, metformin 500 mg twice daily.  Monitor sugars at home.  Schedule appointment with regular PCP in 2 to 3 weeks and keep appointment if not improving.  Counseled on diet and exercise. The patient and his spouse voiced understanding and agreement to the plan.  I spent 42 minutes with the patient and his spouse discussing the above plans in addition to reviewing his chart and the same day of the visit.  Jilda Roche [place], DO 06/10/23  5:04 PM

## 2023-06-10 NOTE — Patient Instructions (Signed)
Give [place] 2-3 business days to get the results of your labs back.   Keep the diet clean and stay active.  OK to transition back to home regimen for your sugars.   Keep an eye on your sugars.   Let us know if you need anything.

## 2023-06-10 NOTE — Therapy (Unsigned)
OUTPATIENT OCCUPATIONAL THERAPY NEURO EVALUATION  Patient Name: Alex Church MRN: [id number] DOB:March 15, 1946, 76 y.o., male Today's Date: 06/11/2023  PCP: Willow Ora, MD REFERRING PROVIDER: Delle Reining, PA-C  END OF SESSION:  OT End of Session - 06/11/23 2031     Visit Number 1    Number of Visits 7    Date for OT Re-Evaluation 07/31/23    Authorization Type BCBS Medicare    OT Start Time 1149    OT Stop Time 1241    OT Time Calculation (min) 52 min    Activity Tolerance Patient tolerated treatment well    Behavior During Therapy WFL for tasks assessed/performed             Past Medical History:  Diagnosis Date   B12 deficiency    monthly shots   Diabetes mellitus    Glaucoma suspect    Hyperlipemia    Hypertension    Past Surgical History:  Procedure Laterality Date   APPLICATION OF CRANIAL NAVIGATION Right 05/12/2023   Procedure: APPLICATION OF CRANIAL NAVIGATION;  Surgeon: Bethann Goo, DO;  Location: [facility] OR;  Service: Neurosurgery;  Laterality: Right;   CRANIOTOMY Right 05/12/2023   Procedure: RIGHT STERIOSTACTIC FRONTAL CRANIOTOMY FOR TUMOR RESECTION;  Surgeon: Bethann Goo, DO;  Location: [facility] OR;  Service: Neurosurgery;  Laterality: Right;   NO PAST SURGERIES     Patient Active Problem List   Diagnosis Date Noted   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   Glioblastoma, 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   Left-sided weakness 05/06/2023   PVC (premature ventricular contraction) 08/27/2022   Glaucoma suspect 04/30/2020   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   Annual physical exam 06/30/2016   PCP NOTES >>>>>>>>>>>>>>>>>>>>> 02/25/2016   DM2 (diabetes mellitus, type 2) (HCC) 07/06/2013   B12 deficiency 07/06/2013   CAROTID BRUIT, RIGHT 10/01/2010   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   HYPERPLASIA PROSTATE UNS W/O UR OBST & OTH LUTS 08/26/2007   HLD (hyperlipidemia) 10/02/2006   Hypertension 10/02/2006    ONSET DATE: 05/05/23  REFERRING DIAG: Brain Tumor,  S/P Resection  THERAPY DIAG:  Other lack of coordination  Other symptoms and signs involving the nervous system  Rationale for Evaluation and Treatment: Rehabilitation  SUBJECTIVE:   SUBJECTIVE STATEMENT: "I can't lift what I used to lift" Pt accompanied by: self and significant other  PERTINENT HISTORY: The patient was admitted to [facility] on 05/15/2023 and discharged on 05/23/2023 to Inpatient rehab and spent 18 days A. 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  PRECAUTIONS: Fall  WEIGHT BEARING RESTRICTIONS: No  PAIN:  Are you having pain? No  FALLS: Has patient fallen in last 6 months? Yes. Number of falls "multiple"  LIVING ENVIRONMENT: Lives with: lives with their family Lives in: House/apartment Stairs: Yes: Internal: 13 steps; can reach both and External: 3 steps; none Has following equipment at home: Single point cane, Walker - 2 wheeled, shower chair, bed side commode, and Grab bars  PLOF: Independent  PATIENT GOALS: "I'd like to get back to where I was at"  OBJECTIVE:  Note: Objective measures were completed at Evaluation unless otherwise noted.  HAND DOMINANCE: Right  ADLs: Overall ADLs: Pt reports difficulty with lifting and carrying heavy items, specifically, yard work, cleaning, and cooking.   MOBILITY STATUS: Hx of falls  POSTURE COMMENTS:  rounded shoulders Sitting balance: Moves/returns truncal midpoint 1-2 inches in multiple planes  ACTIVITY TOLERANCE: Activity tolerance: Mildly  limited  FUNCTIONAL OUTCOME MEASURES: FOTO: 57.89/100  UPPER EXTREMITY ROM:    All Motions WFL  UPPER EXTREMITY MMT:     MMT Right eval Left eval  Shoulder flexion 4/5 4-/5  Shoulder abduction 5/5 4+/5  Shoulder adduction 5/5 5/5  Shoulder extension 4+/5 4+/5  Shoulder internal rotation 4+/5 4/5  Shoulder external rotation 4+/5 4/5  Elbow flexion 5/5 4+/5  Elbow extension 5/5 4/5   Wrist flexion 5/5 4+/5  Wrist extension 5/5 4+/5  Wrist ulnar deviation 5/5 5/5  Wrist radial deviation 5/5 5/5  Wrist pronation 5/5 5/5  Wrist supination 5/5 5/5  (Blank rows = not tested)  HAND FUNCTION: Grip strength: Right: 68 lbs; Left: 64 lbs, Lateral pinch: Right: 19 lbs, Left: 18 lbs, and 3 point pinch: Right: 15 lbs, Left: 12 lbs  COORDINATION: 9 Hole Peg test: Right: 30.87 sec; Left: 34.80 sec  SENSATION: WFL  EDEMA: No swelling noted  OBSERVATIONS: Mildly slowed movements in the hands   TODAY'S TREATMENT:                                                                                                                              DATE: 06/10/23: Evaluation Only   PATIENT EDUCATION: Education details: Will start first session Person educated: Patient Education method: Explanation Education comprehension: verbalized understanding  HOME EXERCISE PROGRAM: Will start first session   GOALS: Goals reviewed with patient? Yes  SHORT TERM GOALS: Target date: 07/31/23  Pt will be provided comprehensive HEP for BUE strengthening in order to complete function tasks independently.   Goal status: INITIAL  2.  Pt will improve BUE strength in order to complete cooking and cleaning at home independently.   Goal status: INITIAL  3.  Pt will increased BUE grip by 10lbs and pinch by 2lbs in order to maintain grasp during yard work tasks.   Goal status: INITIAL  4.  Pt will increase BUE coordination to 27 seconds or quicker in order to improve effectiveness of manipulating buttons, zippers, and clasps.   Goal status: INITIAL   ASSESSMENT:  CLINICAL IMPRESSION: Patient is a 77 y.o. male who was seen today for occupational therapy evaluation for s/p craniotomy following glioblastoma of the brain. Pt presents with increased weakness and decreased coordination and endurance, limiting all ADL's and IADL's.   PERFORMANCE DEFICITS: in functional skills including ADLs,  IADLs, coordination, ROM, strength, Fine motor control, Gross motor control, body mechanics, and UE functional use.  IMPAIRMENTS: are limiting patient from ADLs, IADLs, rest and sleep, work, leisure, and social participation.   CO-MORBIDITIES: has no other co-morbidities that affects occupational performance. Patient will benefit from skilled OT to address above impairments and improve overall function.  MODIFICATION OR ASSISTANCE TO COMPLETE EVALUATION: No modification of tasks or assist necessary to complete an evaluation.  OT OCCUPATIONAL PROFILE AND HISTORY: Problem focused assessment: Including review of records relating to presenting problem.  CLINICAL DECISION MAKING: LOW - limited treatment options,  no task modification necessary  REHAB POTENTIAL: Good  EVALUATION COMPLEXITY: Low    PLAN:  OT FREQUENCY: 1-2x/week  OT DURATION: 6 weeks  PLANNED INTERVENTIONS: self care/ADL training, therapeutic exercise, therapeutic activity, neuromuscular re-education, manual therapy, passive range of motion, functional mobility training, electrical stimulation, ultrasound, paraffin, moist heat, patient/family education, coping strategies training, and DME and/or AE instructions  RECOMMENDED OTHER SERVICES: N/A  CONSULTED AND AGREED WITH PLAN OF CARE: Patient  PLAN FOR NEXT SESSION: Manual therapy as needed, shoulder strengthening, wrist strengthening, grip and pinch strengthening, stabilization.   Trish Mage, OTR/L [facility] [phone] Kenny[place] Arnold, OT 06/11/2023, 8:33 PM

## 2023-06-10 NOTE — Therapy (Signed)
OUTPATIENT PHYSICAL THERAPY NEURO EVALUATION   Patient Name: Alex Church MRN: [id number] DOB:1945/10/16, 77 y.o., male Today's Date: 06/10/2023   PCP: Wanda Plump, MD REFERRING PROVIDER: Jacquelynn Cree, PA-C  END OF SESSION:  PT End of Session - 06/10/23 1220     Visit Number 1    Number of Visits 8    Date for PT Re-Evaluation 07/08/23    Authorization Type BCBS Medicare    Progress Note Due on Visit 8    PT Start Time 1100    PT Stop Time 1145    PT Time Calculation (min) 45 min    Activity Tolerance Patient tolerated treatment well    Behavior During Therapy WFL for tasks assessed/performed             Past Medical History:  Diagnosis Date   B12 deficiency    monthly shots   Diabetes mellitus    Glaucoma suspect    Hyperlipemia    Hypertension    Past Surgical History:  Procedure Laterality Date   APPLICATION OF CRANIAL NAVIGATION Right 05/12/2023   Procedure: APPLICATION OF CRANIAL NAVIGATION;  Surgeon: Bethann Goo, DO;  Location: [facility] OR;  Service: Neurosurgery;  Laterality: Right;   CRANIOTOMY Right 05/12/2023   Procedure: RIGHT STERIOSTACTIC FRONTAL CRANIOTOMY FOR TUMOR RESECTION;  Surgeon: Bethann Goo, DO;  Location: [facility] OR;  Service: Neurosurgery;  Laterality: Right;   NO PAST SURGERIES     Patient Active Problem List   Diagnosis Date Noted   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   Glioblastoma, 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   Left-sided weakness 05/06/2023   Acute encephalopathy 05/06/2023   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) 05/05/2023   PVC (premature ventricular contraction) 08/27/2022   Glaucoma suspect 04/30/2020   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   Annual physical exam 06/30/2016   PCP NOTES >>>>>>>>>>>>>>>>>>>>> 02/25/2016   DM2 (diabetes mellitus, type 2) (HCC) 07/06/2013   B12 deficiency 07/06/2013   CAROTID BRUIT, RIGHT 10/01/2010   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 Per wife, patient still shuffles a little when he walks. Patient was diagnosed with a brain tumor (glioblastoma on the R hemisphere) on 05/05/23. Patient had craniotomy to remove the tumor on 05/12/23. Stayed at the hospital for around 10 days and at the rehab hospital for 4-5 days. Patient was then referred to outpatient PT evaluation and management. Patient will start his radiation next week everyday (excluding weekends) for 30 days.  Pt accompanied by:  spouse  PERTINENT HISTORY: PVC  PAIN:  Are you having pain? No  PRECAUTIONS: Fall  RED FLAGS: Bowel or bladder incontinence: Yes: recently diagnosed with UTI    WEIGHT BEARING RESTRICTIONS: No  FALLS: Has patient fallen in last 6 months? Yes. Number of falls 2 (most recent was in August 2024)  LIVING ENVIRONMENT: Lives with: lives with their spouse Lives in: House/apartment Stairs: Yes: Internal: 12-13 steps; can reach both and External: 5 steps; on right going up Has following equipment at home: Single point cane, Walker - 2 wheeled, and Wheelchair (manual)  PLOF: Independent and Independent with basic ADLs  PATIENT GOALS: Wife wishes for the patient's legs to be strong again  OBJECTIVE:  Note: Objective measures were completed at Evaluation  unless otherwise noted.  DIAGNOSTIC FINDINGS:  05/13/23 MRI HEAD WITHOUT AND WITH CONTRAST   TECHNIQUE: Multiplanar, multiecho pulse sequences of the brain and surrounding structures were obtained without and with intravenous contrast.   CONTRAST:  8mL GADAVIST GADOBUTROL 1 MMOL/ML IV SOLN   COMPARISON:  MRI head 05/05/2023.   FINDINGS: Brain: Interval debulking of a right frontal lesion appears larger section cavity with expected postoperative blood products in the resection cavity as well as overlying extra-axial spaces. Small to moderate volume of pneumocephalus antidependently along the frontal convexities anteriorly. Surrounding T2/FLAIR hyperintensities is mildly improved and mass effect is mildly improved. Leftward midline shift now measures approximately 4 mm anteriorly. No suspicious residual enhancement, although postoperative left limits assessment. Mm continued attention on follow-up.   No evidence of acute infarct or hydrocephalus.   Vascular: Major arterial flow voids are maintained at the skull base.   Skull and upper cervical spine: Normal marrow signal.   Sinuses/Orbits: Clear sinuses.  No acute orbital findings.   Other: No mastoid effusions.   IMPRESSION: Expected postoperative findings after debulking of a large right frontal mass, detailed above.MRI HEAD WITHOUT AND WITH CONTRAST   TECHNIQUE: Multiplanar, multiecho pulse sequences of the brain and surrounding structures were obtained without and with intravenous contrast.   CONTRAST:  8mL GADAVIST GADOBUTROL 1 MMOL/ML IV SOLN   COMPARISON:  MRI head 05/05/2023.   FINDINGS: Brain: Interval debulking of a right frontal lesion appears larger section cavity with expected postoperative blood products in the resection cavity as well as overlying extra-axial spaces. Small to moderate volume of pneumocephalus antidependently along the frontal convexities anteriorly. Surrounding T2/FLAIR hyperintensities  is mildly improved and mass effect is mildly improved. Leftward midline shift now measures approximately 4 mm anteriorly. No suspicious residual enhancement, although postoperative left limits assessment. Mm continued attention on follow-up.   No evidence of acute infarct or hydrocephalus.   Vascular: Major arterial flow voids are maintained at the skull base.   Skull and upper cervical spine: Normal marrow signal.   Sinuses/Orbits: Clear sinuses.  No acute orbital findings.   Other: No mastoid effusions.   IMPRESSION: Expected postoperative findings after debulking of  a large right frontal mass, detailed above.  05/05/23 MRI HEAD WITHOUT AND WITH CONTRAST   TECHNIQUE: Multiplanar, multiecho pulse sequences of the brain and surrounding structures were obtained without and with intravenous contrast.   CONTRAST:  9mL GADAVIST GADOBUTROL 1 MMOL/ML IV SOLN   COMPARISON:  05/05/2023 CTA head neck   FINDINGS: Brain: There is a large, predominantly peripherally contrast-enhancing mass located within the right frontal lobe, measuring 6.8 x 4.9 cm. There is a large amount of surrounding hyperintense T2-weighted signal. There is marked mass effect on the right lateral ventricle and leftward midline shift 6 mm. There is early subfalcine herniation of the cingulate gyrus. No acute hemorrhage. The central necrotic component of the mass does not restrict diffusion.   Vascular: Major flow voids are preserved.   Skull and upper cervical spine: Normal calvarium and skull base. Visualized upper cervical spine and soft tissues are normal.   Sinuses/Orbits:No paranasal sinus fluid levels or advanced mucosal thickening. No mastoid or middle ear effusion. Normal orbits.   IMPRESSION: 1. Large, predominantly peripherally contrast-enhancing mass located within the right frontal lobe, measuring 6.8 x 4.9 cm. Large amount of surrounding hyperintense T2-weighted signal. This is  most consistent with a high-grade glioma. 2. Marked mass effect on the right lateral ventricle and 6 mm leftward midline shift. Early subfalcine herniation of the cingulate gyrus.  COGNITION: Overall cognitive status: Within functional limits for tasks assessed   SENSATION: WFL  COORDINATION: Heel to shin: intact and WFL on B Alternating foot taps: intact and WFL  MUSCLE TONE: normal  MUSCLE LENGTH: Hamstrings: moderate restriction on B Gastrocsoleus: moderate restriction on B  POSTURE: Standing: rounded shoulders, forward head, and decreased lumbar lordosis  LOWER EXTREMITY ROM:     Active  Right Eval Left Eval  Hip flexion [facility]  Hip extension [facility]  Hip abduction [facility]  Hip adduction    Hip internal rotation    Hip external rotation    Knee flexion [facility] WFL  Knee extension [facility]  Ankle dorsiflexion [facility] WFL  Ankle plantarflexion [facility] WFL  Ankle inversion    Ankle eversion     (Blank rows = not tested)  LOWER EXTREMITY MMT:    MMT Right Eval Left Eval  Hip flexion 3+ 3+  Hip extension 2+ 2+  Hip abduction 4- 3+  Hip adduction    Hip internal rotation    Hip external rotation    Knee flexion 4+ 4+  Knee extension 4+ 3+  Ankle dorsiflexion 4+ 4+  Ankle plantarflexion 4+ 4+  Ankle inversion    Ankle eversion    (Blank rows = not tested)  BED MOBILITY:  Sit to supine Complete Independence Supine to sit Complete Independence Rolling to Right Complete Independence Rolling to Left Complete Independence  TRANSFERS: Assistive device utilized: Single point cane  Sit to stand: Modified independence Stand to sit: Modified independence  STAIRS: Level of Assistance: Modified independence Stair Negotiation Technique: Alternating Pattern  with Bilateral Rails Number of Stairs: 4  Height of Stairs: 7"  Comments: Little to no unsteadiness noted  GAIT: Gait pattern: decreased hip/knee flexion- Right, decreased hip/knee flexion- Left, and  trendelenburg Distance walked: 308 ft Assistive device utilized: Single point cane Level of assistance: Modified independence Comments: done during  FUNCTIONAL TESTS:  5 times sit to stand: 18.30 sec 2 minute walk test: 308 ft Dynamic Gait Index: 15 Tinetti (balance + gait) = 19   DGI 1. Gait level surface (2) Mild Impairment:  Walks 20', uses assistive devices, slower speed, mild gait deviations. 2. Change in gait speed (3) Normal: Able to smoothly change walking speed without loss of balance or gait deviation. Shows a significant difference in walking speeds between normal, fast and slow speeds. 3. Gait with horizontal head turns (1) Moderate Impairment: Performs head turns with moderate change in gait velocity, slows down, staggers but recovers, can continue to walk. 4. Gait with vertical head turns 1) Moderate Impairment: Performs head turns with moderate change in gait velocity, slows down, staggers but recovers, can continue to walk. 5. Gait and pivot turn (2) Mild Impairment: Pivot turns safely in > 3 seconds and stops with no loss of balance. 6. Step over obstacle (2) Mild Impairment: Is able to step over box, but must slow down and adjust steps to clear box safely. 7. Step around obstacles (2) Mild Impairment: Is able to step around both cones, but must slow down and adjust steps to clear cones. 8. Stairs (2) Mild Impairment: Alternating feet, must use rail.  TOTAL SCORE: 15 / 24   PATIENT SURVEYS:  LEFS 51/80 = 63.7%  TODAY'S TREATMENT:                                                                                                                              DATE:  06/10/23 Evaluation and patient education    PATIENT EDUCATION: Education details: Educated on the goals and course of rehab. Education on measures to reduce falls at home. Person educated: Patient and Spouse Education method: Explanation Education comprehension: verbal cues required  HOME  EXERCISE PROGRAM: None provided to date  GOALS: Goals reviewed with patient? Yes  SHORT TERM GOALS: Target date: 06/24/23  Pt will demonstrate indep in HEP to facilitate carry-over of skilled services and improve functional outcomes Goal status: INITIAL  LONG TERM GOALS: Target date: 07/08/23  Pt will decrease 5TSTS by at least 3 seconds in order to demonstrate clinically significant improvement in LE strength  Baseline: 18.30 sec Goal status: INITIAL  2.  Pt will improve DGI by at least 3 points in order to demonstrate clinically significant improvement in balance and decreased risk for falls  Baseline: 15 Goal status: INITIAL  3.  Patient will demonstrate increase in Tinetti Score by 3 points in order to demonstrate clinically significant improvement in balance and decreased risk for falls Baseline: 19 Goal status: INITIAL  4.  Pt will increase by at least 40 ft in order to demonstrate clinically significant improvement in community ambulation Baseline: 308 ft Goal status: INITIAL  5.  Pt will increase LEFS by at least 9 points in order to demonstrate significant improvement in lower extremity function.  Baseline: 51 Goal status: INITIAL  6.  Pt will demonstrate increase in LE strength to 4+/5 to facilitate ease and safety in ambulation Baseline: 2+/5 Goal status: INITIAL  ASSESSMENT:  CLINICAL IMPRESSION: Patient is a 77 y.o. male who was seen today for  physical therapy evaluation and treatment for s/p craniotomy. Patient's condition is further defined by difficulty with walking due to impaired balance/proprioception and weakness. Skilled PT is required to address the impairments and functional limitations listed below.    OBJECTIVE IMPAIRMENTS: Abnormal gait, decreased activity tolerance, decreased balance, difficulty walking, decreased strength, and impaired flexibility.   ACTIVITY LIMITATIONS: carrying, lifting, bending, standing, squatting, stairs, and  transfers  PARTICIPATION LIMITATIONS: meal prep, cleaning, laundry, driving, shopping, community activity, and yard work  PERSONAL FACTORS: Age are also affecting patient's functional outcome.   REHAB POTENTIAL: Good  CLINICAL DECISION MAKING: Stable/uncomplicated  EVALUATION COMPLEXITY: Low  PLAN:  PT FREQUENCY: 2x/week  PT DURATION: 4 weeks  PLANNED INTERVENTIONS: Therapeutic exercises, Therapeutic activity, Neuromuscular re-education, Balance training, Gait training, Patient/Family education, Self Care, Stair training, and Manual therapy  PLAN FOR NEXT SESSION: Provide HEP. Begin LE flexibility, balance, gait and strengthening activities.   Tish Frederickson. Aroldo Galli, PT, DPT, OCS Board-Certified Clinical Specialist in Orthopedic PT PT Compact Privilege # ([place]): [id number] T 06/10/2023, 12:23 PM

## 2023-06-11 ENCOUNTER — Encounter: Payer: Self-pay | Admitting: Internal Medicine

## 2023-06-11 LAB — BASIC METABOLIC PANEL
BUN: 18 mg/dL (ref 6–23)
CO2: 29 meq/L (ref 19–32)
Calcium: 8.4 mg/dL (ref 8.4–10.5)
Chloride: 95 meq/L — ABNORMAL LOW (ref 96–112)
Creatinine, Ser: 1.08 mg/dL (ref 0.40–1.50)
GFR: 66.4 mL/min (ref >60.00)
Glucose, Bld: 171 mg/dL — ABNORMAL HIGH (ref 70–99)
Potassium: 4 meq/L (ref 3.5–5.1)
Sodium: 134 meq/L — ABNORMAL LOW (ref 135–145)

## 2023-06-12 DIAGNOSIS — C711 Malignant neoplasm of frontal lobe: Secondary | ICD-10-CM | POA: Diagnosis not present

## 2023-06-15 ENCOUNTER — Ambulatory Visit (HOSPITAL_COMMUNITY): Payer: Medicare Other | Admitting: Physical Therapy

## 2023-06-15 ENCOUNTER — Other Ambulatory Visit: Payer: Self-pay | Admitting: Internal Medicine

## 2023-06-15 DIAGNOSIS — R29818 Other symptoms and signs involving the nervous system: Secondary | ICD-10-CM | POA: Diagnosis not present

## 2023-06-15 DIAGNOSIS — R29898 Other symptoms and signs involving the musculoskeletal system: Secondary | ICD-10-CM | POA: Diagnosis not present

## 2023-06-15 DIAGNOSIS — R278 Other lack of coordination: Secondary | ICD-10-CM | POA: Diagnosis not present

## 2023-06-15 DIAGNOSIS — R262 Difficulty in walking, not elsewhere classified: Secondary | ICD-10-CM | POA: Diagnosis not present

## 2023-06-15 NOTE — Therapy (Addendum)
OUTPATIENT PHYSICAL THERAPY NEURO Treatment    Patient Name: Alex Church MRN: [id number] DOB:1945-09-09, 77 y.o., male Today's Date: 06/15/2023   PCP: Wanda Plump, MD REFERRING PROVIDER: Jacquelynn Cree, PA-C  END OF SESSION:  PT End of Session - 06/15/23 1055     Visit Number 2    Number of Visits 8    Date for PT Re-Evaluation 07/08/23    Authorization Type [employer] Medicare    Progress Note Due on Visit 8    PT Start Time 1016    PT Stop Time 1056    PT Time Calculation (min) 40 min    Activity Tolerance Patient tolerated treatment well    Behavior During Therapy WFL for tasks assessed/performed              Past Medical History:  Diagnosis Date   B12 deficiency    monthly shots   Diabetes mellitus    Glaucoma suspect    Hyperlipemia    Hypertension    Past Surgical History:  Procedure Laterality Date   APPLICATION OF CRANIAL NAVIGATION Right 05/12/2023   Procedure: APPLICATION OF CRANIAL NAVIGATION;  Surgeon: Bethann Goo, DO;  Location: [facility] OR;  Service: Neurosurgery;  Laterality: Right;   CRANIOTOMY Right 05/12/2023   Procedure: RIGHT STERIOSTACTIC FRONTAL CRANIOTOMY FOR TUMOR RESECTION;  Surgeon: Bethann Goo, DO;  Location: [facility] OR;  Service: Neurosurgery;  Laterality: Right;   NO PAST SURGERIES     Patient Active Problem List   Diagnosis Date Noted   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   Glioblastoma, 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   Left-sided weakness 05/06/2023   PVC (premature ventricular contraction) 08/27/2022   Glaucoma suspect 04/30/2020   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   Annual physical exam 06/30/2016   PCP NOTES >>>>>>>>>>>>>>>>>>>>> 02/25/2016   DM2 (diabetes mellitus, type 2) (HCC) 07/06/2013   B12 deficiency 07/06/2013   CAROTID BRUIT, RIGHT 10/01/2010   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   HYPERPLASIA PROSTATE UNS W/O UR OBST & OTH LUTS 08/26/2007   HLD (hyperlipidemia) 10/02/2006   Hypertension 10/02/2006    ONSET DATE:  S/P brain tumor resection 05/12/23  REFERRING DIAG: D49.6 (ICD-10-CM) - Neoplasm of unspecified behavior of brain  THERAPY DIAG:  Other symptoms and signs involving the nervous system  Difficulty walking  Rationale for Evaluation and Treatment: Rehabilitation  SUBJECTIVE:                                                                                                                                                                                             SUBJECTIVE STATEMENT: Pt states he knows that his  hips are weak, and his balance is off.    Pt accompanied by:  spouse  PERTINENT HISTORY: PVC  PAIN:  Are you having pain? No  PRECAUTIONS: Fall  RED FLAGS: Bowel or bladder incontinence: Yes: recently diagnosed with UTI    WEIGHT BEARING RESTRICTIONS: No  FALLS: Has patient fallen in last 6 months? Yes. Number of falls 2 (most recent was in August 2024)  LIVING ENVIRONMENT: Lives with: lives with their spouse Lives in: House/apartment Stairs: Yes: Internal: 12-13 steps; can reach both and External: 5 steps; on right going up Has following equipment at home: Single point cane, Walker - 2 wheeled, and Wheelchair (manual)  PLOF: Independent and Independent with basic ADLs  PATIENT GOALS: Wife wishes for the patient's legs to be strong again  OBJECTIVE:  Note: Objective measures were completed at Evaluation unless otherwise noted.  DIAGNOSTIC FINDINGS:  05/13/23 MRI HEAD WITHOUT AND WITH CONTRAST   TECHNIQUE: Multiplanar, multiecho pulse sequences of the brain and surrounding structures were obtained without and with intravenous contrast.   CONTRAST:  8mL GADAVIST GADOBUTROL 1 MMOL/ML IV SOLN   COMPARISON:  MRI head 05/05/2023.   FINDINGS: Brain: Interval debulking of a right frontal lesion appears larger section cavity with expected postoperative blood products in the resection cavity as well as overlying extra-axial spaces. Small to moderate volume of  pneumocephalus antidependently along the frontal convexities anteriorly. Surrounding T2/FLAIR hyperintensities is mildly improved and mass effect is mildly improved. Leftward midline shift now measures approximately 4 mm anteriorly. No suspicious residual enhancement, although postoperative left limits assessment. Mm continued attention on follow-up.   No evidence of acute infarct or hydrocephalus.   Vascular: Major arterial flow voids are maintained at the skull base.   Skull and upper cervical spine: Normal marrow signal.   Sinuses/Orbits: Clear sinuses.  No acute orbital findings.   Other: No mastoid effusions.   IMPRESSION: Expected postoperative findings after debulking of a large right frontal mass, detailed above.MRI HEAD WITHOUT AND WITH CONTRAST   TECHNIQUE: Multiplanar, multiecho pulse sequences of the brain and surrounding structures were obtained without and with intravenous contrast.   CONTRAST:  8mL GADAVIST GADOBUTROL 1 MMOL/ML IV SOLN   COMPARISON:  MRI head 05/05/2023.   FINDINGS: Brain: Interval debulking of a right frontal lesion appears larger section cavity with expected postoperative blood products in the resection cavity as well as overlying extra-axial spaces. Small to moderate volume of pneumocephalus antidependently along the frontal convexities anteriorly. Surrounding T2/FLAIR hyperintensities is mildly improved and mass effect is mildly improved. Leftward midline shift now measures approximately 4 mm anteriorly. No suspicious residual enhancement, although postoperative left limits assessment. Mm continued attention on follow-up.   No evidence of acute infarct or hydrocephalus.   Vascular: Major arterial flow voids are maintained at the skull base.   Skull and upper cervical spine: Normal marrow signal.   Sinuses/Orbits: Clear sinuses.  No acute orbital findings.   Other: No mastoid effusions.   IMPRESSION: Expected postoperative  findings after debulking of a large right frontal mass, detailed above.  05/05/23 MRI HEAD WITHOUT AND WITH CONTRAST   TECHNIQUE: Multiplanar, multiecho pulse sequences of the brain and surrounding structures were obtained without and with intravenous contrast.   CONTRAST:  9mL GADAVIST GADOBUTROL 1 MMOL/ML IV SOLN   COMPARISON:  05/05/2023 CTA head neck   FINDINGS: Brain: There is a large, predominantly peripherally contrast-enhancing mass located within the right frontal lobe, measuring 6.8 x 4.9 cm. There is a large amount of surrounding  hyperintense T2-weighted signal. There is marked mass effect on the right lateral ventricle and leftward midline shift 6 mm. There is early subfalcine herniation of the cingulate gyrus. No acute hemorrhage. The central necrotic component of the mass does not restrict diffusion.   Vascular: Major flow voids are preserved.   Skull and upper cervical spine: Normal calvarium and skull base. Visualized upper cervical spine and soft tissues are normal.   Sinuses/Orbits:No paranasal sinus fluid levels or advanced mucosal thickening. No mastoid or middle ear effusion. Normal orbits.   IMPRESSION: 1. Large, predominantly peripherally contrast-enhancing mass located within the right frontal lobe, measuring 6.8 x 4.9 cm. Large amount of surrounding hyperintense T2-weighted signal. This is most consistent with a high-grade glioma. 2. Marked mass effect on the right lateral ventricle and 6 mm leftward midline shift. Early subfalcine herniation of the cingulate gyrus.  COGNITION: Overall cognitive status: Within functional limits for tasks assessed   SENSATION: WFL  COORDINATION: Heel to shin: intact and WFL on B Alternating foot taps: intact and WFL  MUSCLE TONE: normal  MUSCLE LENGTH: Hamstrings: moderate restriction on B Gastrocsoleus: moderate restriction on B  POSTURE: Standing: rounded shoulders, forward head, and decreased lumbar  lordosis  LOWER EXTREMITY ROM:     Active  Right Eval Left Eval  Hip flexion [facility]  Hip extension [employer] [facility]  Hip abduction [facility]  Hip adduction    Hip internal rotation    Hip external rotation    Knee flexion [facility] WFL  Knee extension [facility]  Ankle dorsiflexion [facility] WFL  Ankle plantarflexion [facility] WFL  Ankle inversion    Ankle eversion     (Blank rows = not tested)  LOWER EXTREMITY MMT:    MMT Right Eval Left Eval  Hip flexion 3+ 3+  Hip extension 2+ 2+  Hip abduction 4- 3+  Hip adduction    Hip internal rotation    Hip external rotation    Knee flexion 4+ 4+  Knee extension 4+ 3+  Ankle dorsiflexion 4+ 4+  Ankle plantarflexion 4+ 4+  Ankle inversion    Ankle eversion    (Blank rows = not tested)  BED MOBILITY:  Sit to supine Complete Independence Supine to sit Complete Independence Rolling to Right Complete Independence Rolling to Left Complete Independence  TRANSFERS: Assistive device utilized: Single point cane  Sit to stand: Modified independence Stand to sit: Modified independence  STAIRS: Level of Assistance: Modified independence Stair Negotiation Technique: Alternating Pattern  with Bilateral Rails Number of Stairs: 4  Height of Stairs: 7"  Comments: Little to no unsteadiness noted  GAIT: Gait pattern: decreased hip/knee flexion- Right, decreased hip/knee flexion- Left, and trendelenburg Distance walked: 308 ft Assistive device utilized: Single point cane Level of assistance: Modified independence Comments: done during  FUNCTIONAL TESTS:  5 times sit to stand: 18.30 sec 2 minute walk test: 308 ft Dynamic Gait Index: 15 Tinetti (balance + gait) = 19   DGI 1. Gait level surface (2) Mild Impairment: Walks 20', uses assistive devices, slower speed, mild gait deviations. 2. Change in gait speed (3) Normal: Able to smoothly change walking speed without loss of balance or gait deviation. Shows a significant difference in walking speeds  between normal, fast and slow speeds. 3. Gait with horizontal head turns (1) Moderate Impairment: Performs head turns with moderate change in gait velocity, slows down, staggers but recovers, can continue to walk. 4. Gait with vertical head turns 1) Moderate Impairment: Performs head turns with moderate  change in gait velocity, slows down, staggers but recovers, can continue to walk. 5. Gait and pivot turn (2) Mild Impairment: Pivot turns safely in > 3 seconds and stops with no loss of balance. 6. Step over obstacle (2) Mild Impairment: Is able to step over box, but must slow down and adjust steps to clear box safely. 7. Step around obstacles (2) Mild Impairment: Is able to step around both cones, but must slow down and adjust steps to clear cones. 8. Stairs (2) Mild Impairment: Alternating feet, must use rail.  TOTAL SCORE: 15 / 24   PATIENT SURVEYS:  LEFS 51/80 = 63.7%  TODAY'S TREATMENT:                                                                                                                              DATE:  06/15/23 Sit to stand x 10 Standing: March x 10  Squat x 10 Tandem stance x 5 with Rt in front x 5 with left in front.   Supine bridge x 10 SLR x 10 Knee to chest 30 x 3"  Side lying  hip abduction x 10 B 06/10/23 Evaluation and patient education    PATIENT EDUCATION: Education details: Educated on the goals and course of rehab. Education on measures to reduce falls at home. Person educated: Patient and Spouse Education method: Explanation Education comprehension: verbal cues required  HOME EXERCISE PROGRAM: 06/15/23 Access Code: [email or web address] URL: [email or web address] Date: 06/15/2023 Prepared by: Virgina Organ  Exercises - Supine Bridge  - 1 x daily - 7 x weekly - 1 sets - 10 reps - 5" hold - Heel Raises with Counter Support  - 1 x daily - 7 x weekly - 1 sets - 10 reps - 5" hold - Standing March with Counter Support  - 1 x daily - 7 x  weekly - 1 sets - 10 reps - 5" hold - Mini Squat with Counter Support  - 1 x daily - 7 x weekly - 1 sets - 10 reps - 5" hold - Sit to Stand Without Arm Support  - 1 x daily - 7 x weekly - 1 sets - 10 reps  GOALS: Goals reviewed with patient? Yes  SHORT TERM GOALS: Target date: 06/24/23  Pt will demonstrate indep in HEP to facilitate carry-over of skilled services and improve functional outcomes Goal status: on-going  LONG TERM GOALS: Target date: 07/08/23  Pt will decrease 5TSTS by at least 3 seconds in order to demonstrate clinically significant improvement in LE strength  Baseline: 18.30 sec Goal status: on-going  2.  Pt will improve DGI by at least 3 points in order to demonstrate clinically significant improvement in balance and decreased risk for falls  Baseline: 15 Goal status: on-going  3.  Patient will demonstrate increase in Tinetti Score by 3 points in order to demonstrate clinically significant improvement in balance and decreased risk for falls Baseline: 19 Goal status: on-going  4.  Pt will increase by at least 40 ft in order to demonstrate clinically significant improvement in community ambulation Baseline: 308 ft Goal status: on-going  5.  Pt will increase LEFS by at least 9 points in order to demonstrate significant improvement in lower extremity function.  Baseline: 51 Goal status: on-going 6.  Pt will demonstrate increase in LE strength to 4+/5 to facilitate ease and safety in ambulation Baseline: 2+/5 Goal status: on-going  ASSESSMENT:  CLINICAL IMPRESSION: Therapist reviewed pt evaluation and goals.  Therapist began pt on a HEP.  PT has noted decreased strength, decreased balance and decreased activity tolerance.  PT will benefit from skilled PT  to address the impairments and functional limitations listed below.    OBJECTIVE IMPAIRMENTS: Abnormal gait, decreased activity tolerance, decreased balance, difficulty walking, decreased strength, and  impaired flexibility.   ACTIVITY LIMITATIONS: carrying, lifting, bending, standing, squatting, stairs, and transfers  PARTICIPATION LIMITATIONS: meal prep, cleaning, laundry, driving, shopping, community activity, and yard work  PERSONAL FACTORS: Age are also affecting patient's functional outcome.   REHAB POTENTIAL: Good  CLINICAL DECISION MAKING: Stable/uncomplicated  EVALUATION COMPLEXITY: Low  PLAN:  PT FREQUENCY: 2x/week  PT DURATION: 4 weeks  PLANNED INTERVENTIONS: Therapeutic exercises, Therapeutic activity, Neuromuscular re-education, Balance training, Gait training, Patient/Family education, Self Care, Stair training, and Manual therapy  PLAN FOR NEXT SESSION: . Begin LE flexibility, balance, gait and strengthening activities.  Virgina Organ, PT CLT [phone]

## 2023-06-16 ENCOUNTER — Other Ambulatory Visit: Payer: Self-pay

## 2023-06-16 ENCOUNTER — Other Ambulatory Visit: Payer: Self-pay | Admitting: Internal Medicine

## 2023-06-16 ENCOUNTER — Encounter (HOSPITAL_COMMUNITY): Payer: Self-pay

## 2023-06-16 ENCOUNTER — Other Ambulatory Visit (HOSPITAL_COMMUNITY): Payer: Self-pay

## 2023-06-16 MED ORDER — CLONIDINE HCL 0.1 MG PO TABS
0.1000 mg | ORAL_TABLET | Freq: Two times a day (BID) | ORAL | 1 refills | Status: DC
  Filled 2023-06-16: qty 180, 90d supply, fill #0
  Filled 2023-09-28: qty 180, 90d supply, fill #1

## 2023-06-16 MED ORDER — ALLOPURINOL 100 MG PO TABS
100.0000 mg | ORAL_TABLET | Freq: Two times a day (BID) | ORAL | 1 refills | Status: DC
  Filled 2023-06-16: qty 180, 90d supply, fill #0
  Filled 2023-09-06: qty 180, 90d supply, fill #1

## 2023-06-17 ENCOUNTER — Ambulatory Visit (HOSPITAL_COMMUNITY): Payer: Medicare Other

## 2023-06-17 DIAGNOSIS — Z510 Encounter for antineoplastic radiation therapy: Secondary | ICD-10-CM | POA: Diagnosis not present

## 2023-06-17 DIAGNOSIS — C711 Malignant neoplasm of frontal lobe: Secondary | ICD-10-CM | POA: Diagnosis not present

## 2023-06-18 ENCOUNTER — Ambulatory Visit (HOSPITAL_COMMUNITY): Payer: Medicare Other | Admitting: Physical Therapy

## 2023-06-18 ENCOUNTER — Other Ambulatory Visit: Payer: Self-pay

## 2023-06-18 ENCOUNTER — Ambulatory Visit
Admission: RE | Admit: 2023-06-18 | Discharge: 2023-06-18 | Disposition: A | Discharge status: Home / Self Care | Payer: Medicare Other | Source: Ambulatory Visit | Attending: Radiation Oncology | Admitting: Radiation Oncology

## 2023-06-18 DIAGNOSIS — Z510 Encounter for antineoplastic radiation therapy: Secondary | ICD-10-CM | POA: Diagnosis not present

## 2023-06-18 DIAGNOSIS — C711 Malignant neoplasm of frontal lobe: Secondary | ICD-10-CM | POA: Diagnosis not present

## 2023-06-18 LAB — RAD ONC ARIA SESSION SUMMARY
Course Elapsed Days: 0
Plan Fractions Treated to Date: 1
Plan Prescribed Dose Per Fraction: 2 Gy
Plan Total Fractions Prescribed: 23
Plan Total Prescribed Dose: 46 Gy
Reference Point Dosage Given to Date: 2 Gy
Reference Point Session Dosage Given: 2 Gy
Session Number: 1

## 2023-06-19 ENCOUNTER — Ambulatory Visit
Admission: RE | Admit: 2023-06-19 | Discharge: 2023-06-19 | Disposition: A | Discharge status: Home / Self Care | Payer: Medicare Other | Source: Ambulatory Visit | Attending: Radiation Oncology | Admitting: Radiation Oncology

## 2023-06-19 ENCOUNTER — Telehealth: Payer: Self-pay | Admitting: Internal Medicine

## 2023-06-19 ENCOUNTER — Other Ambulatory Visit: Payer: Self-pay

## 2023-06-19 DIAGNOSIS — C711 Malignant neoplasm of frontal lobe: Secondary | ICD-10-CM | POA: Diagnosis not present

## 2023-06-19 DIAGNOSIS — Z510 Encounter for antineoplastic radiation therapy: Secondary | ICD-10-CM | POA: Diagnosis not present

## 2023-06-19 LAB — RAD ONC ARIA SESSION SUMMARY
Course Elapsed Days: 1
Plan Fractions Treated to Date: 2
Plan Prescribed Dose Per Fraction: 2 Gy
Plan Total Fractions Prescribed: 23
Plan Total Prescribed Dose: 46 Gy
Reference Point Dosage Given to Date: 4 Gy
Reference Point Session Dosage Given: 2 Gy
Session Number: 2

## 2023-06-19 NOTE — Telephone Encounter (Signed)
Called patient regarding October/November appointments, left a voicemail.

## 2023-06-22 ENCOUNTER — Ambulatory Visit
Admission: RE | Admit: 2023-06-22 | Discharge: 2023-06-22 | Disposition: A | Discharge status: Home / Self Care | Payer: Medicare Other | Source: Ambulatory Visit | Attending: Radiation Oncology

## 2023-06-22 ENCOUNTER — Other Ambulatory Visit: Payer: Self-pay

## 2023-06-22 ENCOUNTER — Other Ambulatory Visit: Payer: Self-pay | Admitting: Radiation Oncology

## 2023-06-22 DIAGNOSIS — C719 Malignant neoplasm of brain, unspecified: Secondary | ICD-10-CM

## 2023-06-22 DIAGNOSIS — Z510 Encounter for antineoplastic radiation therapy: Secondary | ICD-10-CM | POA: Diagnosis not present

## 2023-06-22 DIAGNOSIS — C711 Malignant neoplasm of frontal lobe: Secondary | ICD-10-CM | POA: Diagnosis not present

## 2023-06-22 LAB — RAD ONC ARIA SESSION SUMMARY
Course Elapsed Days: 4
Plan Fractions Treated to Date: 3
Plan Prescribed Dose Per Fraction: 2 Gy
Plan Total Fractions Prescribed: 23
Plan Total Prescribed Dose: 46 Gy
Reference Point Dosage Given to Date: 6 Gy
Reference Point Session Dosage Given: 2 Gy
Session Number: 3

## 2023-06-22 MED ORDER — SONAFINE EX EMUL
1.0000 | Freq: Once | CUTANEOUS | Status: AC
  Administered 2023-06-22: 1 via TOPICAL

## 2023-06-23 ENCOUNTER — Ambulatory Visit
Admission: RE | Admit: 2023-06-23 | Discharge: 2023-06-23 | Disposition: A | Discharge status: Home / Self Care | Payer: Medicare Other | Source: Ambulatory Visit | Attending: Radiation Oncology | Admitting: Radiation Oncology

## 2023-06-23 ENCOUNTER — Telehealth: Payer: Medicare Other | Admitting: Internal Medicine

## 2023-06-23 ENCOUNTER — Other Ambulatory Visit: Payer: Self-pay

## 2023-06-23 ENCOUNTER — Ambulatory Visit (HOSPITAL_COMMUNITY): Payer: Medicare Other

## 2023-06-23 ENCOUNTER — Other Ambulatory Visit (HOSPITAL_COMMUNITY): Payer: Self-pay

## 2023-06-23 DIAGNOSIS — R29818 Other symptoms and signs involving the nervous system: Secondary | ICD-10-CM

## 2023-06-23 DIAGNOSIS — R262 Difficulty in walking, not elsewhere classified: Secondary | ICD-10-CM | POA: Diagnosis not present

## 2023-06-23 DIAGNOSIS — R278 Other lack of coordination: Secondary | ICD-10-CM | POA: Diagnosis not present

## 2023-06-23 DIAGNOSIS — Z510 Encounter for antineoplastic radiation therapy: Secondary | ICD-10-CM | POA: Diagnosis not present

## 2023-06-23 DIAGNOSIS — C711 Malignant neoplasm of frontal lobe: Secondary | ICD-10-CM | POA: Diagnosis not present

## 2023-06-23 DIAGNOSIS — R399 Unspecified symptoms and signs involving the genitourinary system: Secondary | ICD-10-CM

## 2023-06-23 DIAGNOSIS — R29898 Other symptoms and signs involving the musculoskeletal system: Secondary | ICD-10-CM | POA: Diagnosis not present

## 2023-06-23 LAB — RAD ONC ARIA SESSION SUMMARY
Course Elapsed Days: 5
Plan Fractions Treated to Date: 4
Plan Prescribed Dose Per Fraction: 2 Gy
Plan Total Fractions Prescribed: 23
Plan Total Prescribed Dose: 46 Gy
Reference Point Dosage Given to Date: 8 Gy
Reference Point Session Dosage Given: 2 Gy
Session Number: 4

## 2023-06-23 MED ORDER — TAMSULOSIN HCL 0.4 MG PO CAPS
0.4000 mg | ORAL_CAPSULE | Freq: Every day | ORAL | 0 refills | Status: DC
  Filled 2023-06-23: qty 90, 90d supply, fill #0

## 2023-06-23 NOTE — Therapy (Signed)
OUTPATIENT PHYSICAL THERAPY NEURO Treatment    Patient Name: Alex Church MRN: [id number] DOB:February 17, 1946, 77 y.o., male Today's Date: 06/23/2023   PCP: Wanda Plump, MD REFERRING PROVIDER: Jacquelynn Cree, PA-C  END OF SESSION:  PT End of Session - 06/23/23 1251     Visit Number 3    Number of Visits 8    Date for PT Re-Evaluation 07/08/23    Authorization Type BCBS Medicare    Progress Note Due on Visit 8    PT Start Time 1300    PT Stop Time 1340    PT Time Calculation (min) 40 min    Activity Tolerance Patient tolerated treatment well    Behavior During Therapy WFL for tasks assessed/performed              Past Medical History:  Diagnosis Date   B12 deficiency    monthly shots   Diabetes mellitus    Glaucoma suspect    Hyperlipemia    Hypertension    Past Surgical History:  Procedure Laterality Date   APPLICATION OF CRANIAL NAVIGATION Right 05/12/2023   Procedure: APPLICATION OF CRANIAL NAVIGATION;  Surgeon: Bethann Goo, DO;  Location: [facility] OR;  Service: Neurosurgery;  Laterality: Right;   CRANIOTOMY Right 05/12/2023   Procedure: RIGHT STERIOSTACTIC FRONTAL CRANIOTOMY FOR TUMOR RESECTION;  Surgeon: Bethann Goo, DO;  Location: [facility] OR;  Service: Neurosurgery;  Laterality: Right;   NO PAST SURGERIES     Patient Active Problem List   Diagnosis Date Noted   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   Glioblastoma, 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   Left-sided weakness 05/06/2023   PVC (premature ventricular contraction) 08/27/2022   Glaucoma suspect 04/30/2020   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   Annual physical exam 06/30/2016   PCP NOTES >>>>>>>>>>>>>>>>>>>>> 02/25/2016   DM2 (diabetes mellitus, type 2) (HCC) 07/06/2013   B12 deficiency 07/06/2013   CAROTID BRUIT, RIGHT 10/01/2010   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   HYPERPLASIA PROSTATE UNS W/O UR OBST & OTH LUTS 08/26/2007   HLD (hyperlipidemia) 10/02/2006   Hypertension 10/02/2006    ONSET DATE:  S/P brain tumor resection 05/12/23  REFERRING DIAG: D49.6 (ICD-10-CM) - Neoplasm of unspecified behavior of brain  THERAPY DIAG:  Other symptoms and signs involving the nervous system  Difficulty walking  Other lack of coordination  Rationale for Evaluation and Treatment: Rehabilitation  SUBJECTIVE:                                                                                                                                                                                             SUBJECTIVE STATEMENT: Has  already had radiation this morning; drives to [facility] in the morning; taking a chemo pill at night.  Feeling ok today ;no new issues  Pt accompanied by:  spouse  PERTINENT HISTORY: PVC  PAIN:  Are you having pain? No  PRECAUTIONS: Fall  RED FLAGS: Bowel or bladder incontinence: Yes: recently diagnosed with UTI    WEIGHT BEARING RESTRICTIONS: No  FALLS: Has patient fallen in last 6 months? Yes. Number of falls 2 (most recent was in August 2024)  LIVING ENVIRONMENT: Lives with: lives with their spouse Lives in: House/apartment Stairs: Yes: Internal: 12-13 steps; can reach both and External: 5 steps; on right going up Has following equipment at home: Single point cane, Walker - 2 wheeled, and Wheelchair (manual)  PLOF: Independent and Independent with basic ADLs  PATIENT GOALS: Wife wishes for the patient's legs to be strong again  OBJECTIVE:  Note: Objective measures were completed at Evaluation unless otherwise noted.  DIAGNOSTIC FINDINGS:  05/13/23 MRI HEAD WITHOUT AND WITH CONTRAST   TECHNIQUE: Multiplanar, multiecho pulse sequences of the brain and surrounding structures were obtained without and with intravenous contrast.   CONTRAST:  8mL GADAVIST GADOBUTROL 1 MMOL/ML IV SOLN   COMPARISON:  MRI head 05/05/2023.   FINDINGS: Brain: Interval debulking of a right frontal lesion appears larger section cavity with expected postoperative blood products in  the resection cavity as well as overlying extra-axial spaces. Small to moderate volume of pneumocephalus antidependently along the frontal convexities anteriorly. Surrounding T2/FLAIR hyperintensities is mildly improved and mass effect is mildly improved. Leftward midline shift now measures approximately 4 mm anteriorly. No suspicious residual enhancement, although postoperative left limits assessment. Mm continued attention on follow-up.   No evidence of acute infarct or hydrocephalus.   Vascular: Major arterial flow voids are maintained at the skull base.   Skull and upper cervical spine: Normal marrow signal.   Sinuses/Orbits: Clear sinuses.  No acute orbital findings.   Other: No mastoid effusions.   IMPRESSION: Expected postoperative findings after debulking of a large right frontal mass, detailed above.MRI HEAD WITHOUT AND WITH CONTRAST   TECHNIQUE: Multiplanar, multiecho pulse sequences of the brain and surrounding structures were obtained without and with intravenous contrast.   CONTRAST:  8mL GADAVIST GADOBUTROL 1 MMOL/ML IV SOLN   COMPARISON:  MRI head 05/05/2023.   FINDINGS: Brain: Interval debulking of a right frontal lesion appears larger section cavity with expected postoperative blood products in the resection cavity as well as overlying extra-axial spaces. Small to moderate volume of pneumocephalus antidependently along the frontal convexities anteriorly. Surrounding T2/FLAIR hyperintensities is mildly improved and mass effect is mildly improved. Leftward midline shift now measures approximately 4 mm anteriorly. No suspicious residual enhancement, although postoperative left limits assessment. Mm continued attention on follow-up.   No evidence of acute infarct or hydrocephalus.   Vascular: Major arterial flow voids are maintained at the skull base.   Skull and upper cervical spine: Normal marrow signal.   Sinuses/Orbits: Clear sinuses.  No acute  orbital findings.   Other: No mastoid effusions.   IMPRESSION: Expected postoperative findings after debulking of a large right frontal mass, detailed above.  05/05/23 MRI HEAD WITHOUT AND WITH CONTRAST   TECHNIQUE: Multiplanar, multiecho pulse sequences of the brain and surrounding structures were obtained without and with intravenous contrast.   CONTRAST:  9mL GADAVIST GADOBUTROL 1 MMOL/ML IV SOLN   COMPARISON:  05/05/2023 CTA head neck   FINDINGS: Brain: There is a large, predominantly peripherally contrast-enhancing mass located within the right  frontal lobe, measuring 6.8 x 4.9 cm. There is a large amount of surrounding hyperintense T2-weighted signal. There is marked mass effect on the right lateral ventricle and leftward midline shift 6 mm. There is early subfalcine herniation of the cingulate gyrus. No acute hemorrhage. The central necrotic component of the mass does not restrict diffusion.   Vascular: Major flow voids are preserved.   Skull and upper cervical spine: Normal calvarium and skull base. Visualized upper cervical spine and soft tissues are normal.   Sinuses/Orbits:No paranasal sinus fluid levels or advanced mucosal thickening. No mastoid or middle ear effusion. Normal orbits.   IMPRESSION: 1. Large, predominantly peripherally contrast-enhancing mass located within the right frontal lobe, measuring 6.8 x 4.9 cm. Large amount of surrounding hyperintense T2-weighted signal. This is most consistent with a high-grade glioma. 2. Marked mass effect on the right lateral ventricle and 6 mm leftward midline shift. Early subfalcine herniation of the cingulate gyrus.  COGNITION: Overall cognitive status: Within functional limits for tasks assessed   SENSATION: WFL  COORDINATION: Heel to shin: intact and WFL on B Alternating foot taps: intact and WFL  MUSCLE TONE: normal  MUSCLE LENGTH: Hamstrings: moderate restriction on B Gastrocsoleus: moderate  restriction on B  POSTURE: Standing: rounded shoulders, forward head, and decreased lumbar lordosis  LOWER EXTREMITY ROM:     Active  Right Eval Left Eval  Hip flexion [facility]  Hip extension [facility]  Hip abduction [facility]  Hip adduction    Hip internal rotation    Hip external rotation    Knee flexion [facility] WFL  Knee extension [facility]  Ankle dorsiflexion [facility] WFL  Ankle plantarflexion [facility] WFL  Ankle inversion    Ankle eversion     (Blank rows = not tested)  LOWER EXTREMITY MMT:    MMT Right Eval Left Eval  Hip flexion 3+ 3+  Hip extension 2+ 2+  Hip abduction 4- 3+  Hip adduction    Hip internal rotation    Hip external rotation    Knee flexion 4+ 4+  Knee extension 4+ 3+  Ankle dorsiflexion 4+ 4+  Ankle plantarflexion 4+ 4+  Ankle inversion    Ankle eversion    (Blank rows = not tested)  BED MOBILITY:  Sit to supine Complete Independence Supine to sit Complete Independence Rolling to Right Complete Independence Rolling to Left Complete Independence  TRANSFERS: Assistive device utilized: Single point cane  Sit to stand: Modified independence Stand to sit: Modified independence  STAIRS: Level of Assistance: Modified independence Stair Negotiation Technique: Alternating Pattern  with Bilateral Rails Number of Stairs: 4  Height of Stairs: 7"  Comments: Little to no unsteadiness noted  GAIT: Gait pattern: decreased hip/knee flexion- Right, decreased hip/knee flexion- Left, and trendelenburg Distance walked: 308 ft Assistive device utilized: Single point cane Level of assistance: Modified independence Comments: done during  FUNCTIONAL TESTS:  5 times sit to stand: 18.30 sec 2 minute walk test: 308 ft Dynamic Gait Index: 15 Tinetti (balance + gait) = 19   DGI 1. Gait level surface (2) Mild Impairment: Walks 20', uses assistive devices, slower speed, mild gait deviations. 2. Change in gait speed (3) Normal: Able to smoothly change walking speed  without loss of balance or gait deviation. Shows a significant difference in walking speeds between normal, fast and slow speeds. 3. Gait with horizontal head turns (1) Moderate Impairment: Performs head turns with moderate change in gait velocity, slows down, staggers but recovers, can continue to walk.  4. Gait with vertical head turns 1) Moderate Impairment: Performs head turns with moderate change in gait velocity, slows down, staggers but recovers, can continue to walk. 5. Gait and pivot turn (2) Mild Impairment: Pivot turns safely in > 3 seconds and stops with no loss of balance. 6. Step over obstacle (2) Mild Impairment: Is able to step over box, but must slow down and adjust steps to clear box safely. 7. Step around obstacles (2) Mild Impairment: Is able to step around both cones, but must slow down and adjust steps to clear cones. 8. Stairs (2) Mild Impairment: Alternating feet, must use rail.  TOTAL SCORE: 15 / 24   PATIENT SURVEYS:  LEFS 51/80 = 63.7%  TODAY'S TREATMENT:                                                                                                                              DATE:  06/23/23 Standing: Heels raises on incline x 20 light UE assist Toe raises on decline x 10 light UE assist Sit to stand 2 x 5 no UE assist Tandem stance 2 x 30" sec no UE assist SLS x 20" each occassional UE assist 2# hip abduction 2 x 10 2# hip extension 2 x 10 2# marching 2 x 10   06/15/23 Sit to stand x 10 Standing: March x 10  Squat x 10 Tandem stance x 5 with Rt in front x 5 with left in front.   Supine bridge x 10 SLR x 10 Knee to chest 30 x 3"  Side lying  hip abduction x 10 B 06/10/23 Evaluation and patient education    PATIENT EDUCATION: Education details: Educated on the goals and course of rehab. Education on measures to reduce falls at home. Person educated: Patient and Spouse Education method: Explanation Education comprehension: verbal cues  required  HOME EXERCISE PROGRAM: 06/15/23 Access Code: [email or web address] URL: [email or web address] Date: 06/15/2023 Prepared by: Virgina Organ  Exercises - Supine Bridge  - 1 x daily - 7 x weekly - 1 sets - 10 reps - 5" hold - Heel Raises with Counter Support  - 1 x daily - 7 x weekly - 1 sets - 10 reps - 5" hold - Standing March with Counter Support  - 1 x daily - 7 x weekly - 1 sets - 10 reps - 5" hold - Mini Squat with Counter Support  - 1 x daily - 7 x weekly - 1 sets - 10 reps - 5" hold - Sit to Stand Without Arm Support  - 1 x daily - 7 x weekly - 1 sets - 10 reps  GOALS: Goals reviewed with patient? Yes  SHORT TERM GOALS: Target date: 06/24/23  Pt will demonstrate indep in HEP to facilitate carry-over of skilled services and improve functional outcomes Goal status: on-going  LONG TERM GOALS: Target date: 07/08/23  Pt will decrease 5TSTS by at least 3 seconds in order to demonstrate clinically significant  improvement in LE strength  Baseline: 18.30 sec Goal status: on-going  2.  Pt will improve DGI by at least 3 points in order to demonstrate clinically significant improvement in balance and decreased risk for falls  Baseline: 15 Goal status: on-going  3.  Patient will demonstrate increase in Tinetti Score by 3 points in order to demonstrate clinically significant improvement in balance and decreased risk for falls Baseline: 19 Goal status: on-going  4.  Pt will increase by at least 40 ft in order to demonstrate clinically significant improvement in community ambulation Baseline: 308 ft Goal status: on-going  5.  Pt will increase LEFS by at least 9 points in order to demonstrate significant improvement in lower extremity function.  Baseline: 51 Goal status: on-going 6.  Pt will demonstrate increase in LE strength to 4+/5 to facilitate ease and safety in ambulation Baseline: 2+/5 Goal status: on-going  ASSESSMENT:  CLINICAL IMPRESSION: Today's  session continued to work on lower extremity strengthening and balance; focus on standing exercise today; added weight to standing exercise with good challenge; needs cues for avoiding trunk substitution with hip exercises.   Able to stand tandem stance without UE assist; needs some assist with left leg SLS.    PT will benefit from skilled PT  to address the impairments and functional limitations listed below.    OBJECTIVE IMPAIRMENTS: Abnormal gait, decreased activity tolerance, decreased balance, difficulty walking, decreased strength, and impaired flexibility.   ACTIVITY LIMITATIONS: carrying, lifting, bending, standing, squatting, stairs, and transfers  PARTICIPATION LIMITATIONS: meal prep, cleaning, laundry, driving, shopping, community activity, and yard work  PERSONAL FACTORS: Age are also affecting patient's functional outcome.   REHAB POTENTIAL: Good  CLINICAL DECISION MAKING: Stable/uncomplicated  EVALUATION COMPLEXITY: Low  PLAN:  PT FREQUENCY: 2x/week  PT DURATION: 4 weeks  PLANNED INTERVENTIONS: Therapeutic exercises, Therapeutic activity, Neuromuscular re-education, Balance training, Gait training, Patient/Family education, Self Care, Stair training, and Manual therapy  PLAN FOR NEXT SESSION: . Begin LE flexibility, balance, gait and strengthening activities. 1:46 PM, 06/23/23 Carina Chaplin Small Mileigh Tilley MPT [place] [facility] [place] [phone]

## 2023-06-23 NOTE — Telephone Encounter (Signed)
Received a note from oncology, patient is having nocturia. During hospital admission last month, he was diagnosed with UTI, urine culture: Proteus, treated with Keflex. Apparently at some point had a Foley catheter. Please call the patient or his wife. Recommend: UA urine culture, Dx LUTS (no need for office visit just leave the sample) Start Flomax 0.4 mg 1 tablet at bedtime.  62-month supply. If fever, chills, blood in the urine: Let me know.

## 2023-06-24 ENCOUNTER — Ambulatory Visit
Admission: RE | Admit: 2023-06-24 | Discharge: 2023-06-24 | Disposition: A | Discharge status: Home / Self Care | Payer: Medicare Other | Source: Ambulatory Visit | Attending: Radiation Oncology | Admitting: Radiation Oncology

## 2023-06-24 ENCOUNTER — Other Ambulatory Visit: Payer: Self-pay

## 2023-06-24 ENCOUNTER — Encounter (HOSPITAL_COMMUNITY): Payer: Self-pay | Admitting: Occupational Therapy

## 2023-06-24 ENCOUNTER — Ambulatory Visit (HOSPITAL_COMMUNITY): Payer: Medicare Other | Admitting: Occupational Therapy

## 2023-06-24 ENCOUNTER — Ambulatory Visit (HOSPITAL_COMMUNITY): Payer: Medicare Other

## 2023-06-24 DIAGNOSIS — R262 Difficulty in walking, not elsewhere classified: Secondary | ICD-10-CM | POA: Diagnosis not present

## 2023-06-24 DIAGNOSIS — R278 Other lack of coordination: Secondary | ICD-10-CM

## 2023-06-24 DIAGNOSIS — R29818 Other symptoms and signs involving the nervous system: Secondary | ICD-10-CM

## 2023-06-24 DIAGNOSIS — R29898 Other symptoms and signs involving the musculoskeletal system: Secondary | ICD-10-CM | POA: Diagnosis not present

## 2023-06-24 DIAGNOSIS — C711 Malignant neoplasm of frontal lobe: Secondary | ICD-10-CM | POA: Diagnosis not present

## 2023-06-24 DIAGNOSIS — Z510 Encounter for antineoplastic radiation therapy: Secondary | ICD-10-CM | POA: Diagnosis not present

## 2023-06-24 LAB — RAD ONC ARIA SESSION SUMMARY
Course Elapsed Days: 6
Plan Fractions Treated to Date: 5
Plan Prescribed Dose Per Fraction: 2 Gy
Plan Total Fractions Prescribed: 23
Plan Total Prescribed Dose: 46 Gy
Reference Point Dosage Given to Date: 10 Gy
Reference Point Session Dosage Given: 2 Gy
Session Number: 5

## 2023-06-24 NOTE — Therapy (Unsigned)
OUTPATIENT OCCUPATIONAL THERAPY NEURO TREATMENT NOTE  Patient Name: Alex Church MRN: [id number] DOB:April 15, 1946, 77 y.o., male Today's Date: 06/25/2023  PCP: Willow Ora, MD REFERRING PROVIDER: Delle Reining, PA-C  END OF SESSION:  OT End of Session - 06/24/23 1348     Visit Number 2    Number of Visits 7    Date for OT Re-Evaluation 07/31/23    Authorization Type BCBS Medicare    OT Start Time 1309    OT Stop Time 1348    OT Time Calculation (min) 39 min    Activity Tolerance Patient tolerated treatment well    Behavior During Therapy WFL for tasks assessed/performed              Past Medical History:  Diagnosis Date   B12 deficiency    monthly shots   Diabetes mellitus    Glaucoma suspect    Hyperlipemia    Hypertension    Past Surgical History:  Procedure Laterality Date   APPLICATION OF CRANIAL NAVIGATION Right 05/12/2023   Procedure: APPLICATION OF CRANIAL NAVIGATION;  Surgeon: Bethann Goo, DO;  Location: [facility] OR;  Service: Neurosurgery;  Laterality: Right;   CRANIOTOMY Right 05/12/2023   Procedure: RIGHT STERIOSTACTIC FRONTAL CRANIOTOMY FOR TUMOR RESECTION;  Surgeon: Bethann Goo, DO;  Location: [facility] OR;  Service: Neurosurgery;  Laterality: Right;   NO PAST SURGERIES     Patient Active Problem List   Diagnosis Date Noted   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   Glioblastoma, 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   Left-sided weakness 05/06/2023   PVC (premature ventricular contraction) 08/27/2022   Glaucoma suspect 04/30/2020   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   Annual physical exam 06/30/2016   PCP NOTES >>>>>>>>>>>>>>>>>>>>> 02/25/2016   DM2 (diabetes mellitus, type 2) (HCC) 07/06/2013   B12 deficiency 07/06/2013   CAROTID BRUIT, RIGHT 10/01/2010   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   HYPERPLASIA PROSTATE UNS W/O UR OBST & OTH LUTS 08/26/2007   HLD (hyperlipidemia) 10/02/2006   Hypertension 10/02/2006    ONSET DATE: 05/05/23  REFERRING DIAG:  Brain Tumor, S/P Resection  THERAPY DIAG:  Other lack of coordination  Other symptoms and signs involving the nervous system  Rationale for Evaluation and Treatment: Rehabilitation  SUBJECTIVE:   SUBJECTIVE STATEMENT: "I'm really doing alright." Pt accompanied by: self and significant other  PERTINENT HISTORY: The patient was admitted to [facility] on 05/15/2023 and discharged on 05/23/2023 to Inpatient rehab and spent 18 days A. 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  PRECAUTIONS: Fall  WEIGHT BEARING RESTRICTIONS: No  PAIN:  Are you having pain? No  FALLS: Has patient fallen in last 6 months? Yes. Number of falls "multiple"  LIVING ENVIRONMENT: Lives with: lives with their family Lives in: House/apartment Stairs: Yes: Internal: 13 steps; can reach both and External: 3 steps; none Has following equipment at home: Single point cane, Walker - 2 wheeled, shower chair, bed side commode, and Grab bars  PLOF: Independent  PATIENT GOALS: "I'd like to get back to where I was at"  OBJECTIVE:  Note: Objective measures were completed at Evaluation unless otherwise noted.  HAND DOMINANCE: Right  ADLs: Overall ADLs: Pt reports difficulty with lifting and carrying heavy items, specifically, yard work, cleaning, and cooking.   MOBILITY STATUS: Hx of falls  POSTURE COMMENTS:  rounded shoulders Sitting balance: Moves/returns truncal midpoint 1-2 inches in multiple planes  ACTIVITY TOLERANCE: Activity tolerance: Mildly limited  FUNCTIONAL OUTCOME MEASURES: FOTO: 57.89/100  UPPER EXTREMITY ROM:    All Motions WFL  UPPER EXTREMITY MMT:     MMT Right eval Left eval  Shoulder flexion 4/5 4-/5  Shoulder abduction 5/5 4+/5  Shoulder adduction 5/5 5/5  Shoulder extension 4+/5 4+/5  Shoulder internal rotation 4+/5 4/5  Shoulder external rotation 4+/5 4/5  Elbow flexion 5/5 4+/5  Elbow extension 5/5  4/5  Wrist flexion 5/5 4+/5  Wrist extension 5/5 4+/5  Wrist ulnar deviation 5/5 5/5  Wrist radial deviation 5/5 5/5  Wrist pronation 5/5 5/5  Wrist supination 5/5 5/5  (Blank rows = not tested)  HAND FUNCTION: Grip strength: Right: 68 lbs; Left: 64 lbs, Lateral pinch: Right: 19 lbs, Left: 18 lbs, and 3 point pinch: Right: 15 lbs, Left: 12 lbs  COORDINATION: 9 Hole Peg test: Right: 30.87 sec; Left: 34.80 sec  SENSATION: WFL  EDEMA: No swelling noted  OBSERVATIONS: Mildly slowed movements in the hands   TODAY'S TREATMENT:                                                                                                                              DATE:   06/24/23 -Shoulder Strengthening: 2lb dumbbell,  flexion, abduction, protraction, horizontal abduction, er/IR, x10 -Scapular Strengthening: green band, extension, retraction, rows, x10 -Wrist Strengthening: 2lb dumbbell, extension, flexion, ulnar/radial deviation, supination/pronation, x10 -Gripper: 42#, 5 medium beads each hand, 4 small beads each hand -Tiny peg board: holding 3-4 at a time and placing them in the board, mod difficulty not using opposite hand to help with manipulating pegs  PATIENT EDUCATION: Education details: Public relations account executive  Person educated: Patient Education method: Explanation Education comprehension: verbalized understanding  HOME EXERCISE PROGRAM: 10/16: Shoulder strengthening   GOALS: Goals reviewed with patient? Yes  SHORT TERM GOALS: Target date: 07/31/23  Pt will be provided comprehensive HEP for BUE strengthening in order to complete function tasks independently.   Goal status: IN PROGRESS  2.  Pt will improve BUE strength in order to complete cooking and cleaning at home independently.   Goal status: IN PROGRESS  3.  Pt will increased BUE grip by 10lbs and pinch by 2lbs in order to maintain grasp during yard work tasks.   Goal status: IN PROGRESS  4.  Pt will increase BUE  coordination to 27 seconds or quicker in order to improve effectiveness of manipulating buttons, zippers, and clasps.   Goal status: IN PROGRESS   ASSESSMENT:  CLINICAL IMPRESSION: Patient presenting to his first OT Session with good mobility and agreeable to all exercises. He worked on Film/video editor, as well as fine motor coordination. When working on fine motor coordination, pt had increased difficulty with in hand translation, attempting to use BUE to manipulate a small handful of  tiny pegs. OT providing verbal and tactile cuing for positioning and technique.   PERFORMANCE DEFICITS: in functional skills including ADLs, IADLs, coordination, ROM, strength, Fine motor control,  Gross motor control, body mechanics, and UE functional use.    PLAN:  OT FREQUENCY: 1-2x/week  OT DURATION: 6 weeks  PLANNED INTERVENTIONS: self care/ADL training, therapeutic exercise, therapeutic activity, neuromuscular re-education, manual therapy, passive range of motion, functional mobility training, electrical stimulation, ultrasound, paraffin, moist heat, patient/family education, coping strategies training, and DME and/or AE instructions  RECOMMENDED OTHER SERVICES: N/A  CONSULTED AND AGREED WITH PLAN OF CARE: Patient  PLAN FOR NEXT SESSION: Manual therapy as needed, shoulder strengthening, wrist strengthening, grip and pinch strengthening, stabilization.   Trish Mage, OTR/L [facility] [phone] Kennyth Arnold, OT 06/25/2023, 9:00 PM

## 2023-06-25 ENCOUNTER — Ambulatory Visit (HOSPITAL_COMMUNITY): Payer: Medicare Other | Admitting: Physical Therapy

## 2023-06-25 ENCOUNTER — Other Ambulatory Visit (HOSPITAL_COMMUNITY): Payer: Self-pay

## 2023-06-25 ENCOUNTER — Ambulatory Visit
Admission: RE | Admit: 2023-06-25 | Discharge: 2023-06-25 | Disposition: A | Discharge status: Home / Self Care | Payer: Medicare Other | Source: Ambulatory Visit | Attending: Radiation Oncology | Admitting: Radiation Oncology

## 2023-06-25 ENCOUNTER — Other Ambulatory Visit: Payer: Self-pay

## 2023-06-25 ENCOUNTER — Other Ambulatory Visit (INDEPENDENT_AMBULATORY_CARE_PROVIDER_SITE_OTHER): Payer: Medicare Other

## 2023-06-25 ENCOUNTER — Encounter: Payer: Self-pay | Admitting: Internal Medicine

## 2023-06-25 DIAGNOSIS — C711 Malignant neoplasm of frontal lobe: Secondary | ICD-10-CM | POA: Diagnosis not present

## 2023-06-25 DIAGNOSIS — R399 Unspecified symptoms and signs involving the genitourinary system: Secondary | ICD-10-CM | POA: Diagnosis not present

## 2023-06-25 DIAGNOSIS — Z510 Encounter for antineoplastic radiation therapy: Secondary | ICD-10-CM | POA: Diagnosis not present

## 2023-06-25 DIAGNOSIS — R29898 Other symptoms and signs involving the musculoskeletal system: Secondary | ICD-10-CM | POA: Diagnosis not present

## 2023-06-25 DIAGNOSIS — R29818 Other symptoms and signs involving the nervous system: Secondary | ICD-10-CM | POA: Diagnosis not present

## 2023-06-25 DIAGNOSIS — R262 Difficulty in walking, not elsewhere classified: Secondary | ICD-10-CM

## 2023-06-25 DIAGNOSIS — R278 Other lack of coordination: Secondary | ICD-10-CM | POA: Diagnosis not present

## 2023-06-25 LAB — URINALYSIS, ROUTINE W REFLEX MICROSCOPIC
Bilirubin Urine: NEGATIVE
Ketones, ur: NEGATIVE
Nitrite: NEGATIVE
Specific Gravity, Urine: 1.02 (ref 1.000–1.030)
Total Protein, Urine: 30 — AB
Urine Glucose: NEGATIVE
Urobilinogen, UA: 0.2 (ref 0.0–1.0)
pH: 6.5 (ref 5.0–8.0)

## 2023-06-25 LAB — RAD ONC ARIA SESSION SUMMARY
Course Elapsed Days: 7
Plan Fractions Treated to Date: 6
Plan Prescribed Dose Per Fraction: 2 Gy
Plan Total Fractions Prescribed: 23
Plan Total Prescribed Dose: 46 Gy
Reference Point Dosage Given to Date: 12 Gy
Reference Point Session Dosage Given: 2 Gy
Session Number: 6

## 2023-06-25 MED ORDER — SULFAMETHOXAZOLE-TRIMETHOPRIM 800-160 MG PO TABS
1.0000 | ORAL_TABLET | Freq: Two times a day (BID) | ORAL | 0 refills | Status: DC
  Filled 2023-06-25: qty 20, 10d supply, fill #0

## 2023-06-25 NOTE — Therapy (Signed)
OUTPATIENT PHYSICAL THERAPY NEURO Treatment    Patient Name: Alex Church MRN: [id number] DOB:12-14-1945, 77 y.o., male Today's Date: 06/25/2023   PCP: Wanda Plump, MD REFERRING PROVIDER: Jacquelynn Cree, PA-C  END OF SESSION:  PT End of Session - 06/25/23 1433     Visit Number 4    Number of Visits 8    Date for PT Re-Evaluation 07/08/23    Authorization Type BCBS Medicare    Progress Note Due on Visit 8    PT Start Time 1435    PT Stop Time 1515    PT Time Calculation (min) 40 min    Activity Tolerance Patient tolerated treatment well    Behavior During Therapy WFL for tasks assessed/performed              Past Medical History:  Diagnosis Date   B12 deficiency    monthly shots   Diabetes mellitus    Glaucoma suspect    Hyperlipemia    Hypertension    Past Surgical History:  Procedure Laterality Date   APPLICATION OF CRANIAL NAVIGATION Right 05/12/2023   Procedure: APPLICATION OF CRANIAL NAVIGATION;  Surgeon: Bethann Goo, DO;  Location: [facility];  Service: Neurosurgery;  Laterality: Right;   CRANIOTOMY Right 05/12/2023   Procedure: RIGHT STERIOSTACTIC FRONTAL CRANIOTOMY FOR TUMOR RESECTION;  Surgeon: Bethann Goo, DO;  Location: [facility] OR;  Service: Neurosurgery;  Laterality: Right;   NO PAST SURGERIES     Patient Active Problem List   Diagnosis Date Noted   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   Glioblastoma, 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   Left-sided weakness 05/06/2023   PVC (premature ventricular contraction) 08/27/2022   Glaucoma suspect 04/30/2020   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   Annual physical exam 06/30/2016   PCP NOTES >>>>>>>>>>>>>>>>>>>>> 02/25/2016   DM2 (diabetes mellitus, type 2) (HCC) 07/06/2013   B12 deficiency 07/06/2013   CAROTID BRUIT, RIGHT 10/01/2010   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   HYPERPLASIA PROSTATE UNS W/O UR OBST & OTH LUTS 08/26/2007   HLD (hyperlipidemia) 10/02/2006   Hypertension 10/02/2006    ONSET DATE:  S/P brain tumor resection 05/12/23  REFERRING DIAG: D49.6 (ICD-10-CM) - Neoplasm of unspecified behavior of brain  THERAPY DIAG:  Other symptoms and signs involving the nervous system  Difficulty walking  Rationale for Evaluation and Treatment: Rehabilitation  SUBJECTIVE:                                                                                                                                                                                             SUBJECTIVE STATEMENT: Has already had radiation this morning;  drives to [facility] in the morning; taking a chemo pill at night.  Feeling ok today ;no new issues  Pt accompanied by:  spouse  PERTINENT HISTORY: PVC  PAIN:  Are you having pain? No  PRECAUTIONS: Fall  RED FLAGS: Bowel or bladder incontinence: Yes: recently diagnosed with UTI    WEIGHT BEARING RESTRICTIONS: No  FALLS: Has patient fallen in last 6 months? Yes. Number of falls 2 (most recent was in August 2024)  LIVING ENVIRONMENT: Lives with: lives with their spouse Lives in: House/apartment Stairs: Yes: Internal: 12-13 steps; can reach both and External: 5 steps; on right going up Has following equipment at home: Single point cane, Walker - 2 wheeled, and Wheelchair (manual)  PLOF: Independent and Independent with basic ADLs  PATIENT GOALS: Wife wishes for the patient's legs to be strong again  OBJECTIVE:  Note: Objective measures were completed at Evaluation unless otherwise noted.  DIAGNOSTIC FINDINGS:  05/13/23 MRI HEAD WITHOUT AND WITH CONTRAST   TECHNIQUE: Multiplanar, multiecho pulse sequences of the brain and surrounding structures were obtained without and with intravenous contrast.   CONTRAST:  8mL GADAVIST GADOBUTROL 1 MMOL/ML IV SOLN   COMPARISON:  MRI head 05/05/2023.   FINDINGS: Brain: Interval debulking of a right frontal lesion appears larger section cavity with expected postoperative blood products in the resection cavity as well  as overlying extra-axial spaces. Small to moderate volume of pneumocephalus antidependently along the frontal convexities anteriorly. Surrounding T2/FLAIR hyperintensities is mildly improved and mass effect is mildly improved. Leftward midline shift now measures approximately 4 mm anteriorly. No suspicious residual enhancement, although postoperative left limits assessment. Mm continued attention on follow-up.   No evidence of acute infarct or hydrocephalus.   Vascular: Major arterial flow voids are maintained at the skull base.   Skull and upper cervical spine: Normal marrow signal.   Sinuses/Orbits: Clear sinuses.  No acute orbital findings.   Other: No mastoid effusions.   IMPRESSION: Expected postoperative findings after debulking of a large right frontal mass, detailed above.MRI HEAD WITHOUT AND WITH CONTRAST   TECHNIQUE: Multiplanar, multiecho pulse sequences of the brain and surrounding structures were obtained without and with intravenous contrast.   CONTRAST:  8mL GADAVIST GADOBUTROL 1 MMOL/ML IV SOLN   COMPARISON:  MRI head 05/05/2023.   FINDINGS: Brain: Interval debulking of a right frontal lesion appears larger section cavity with expected postoperative blood products in the resection cavity as well as overlying extra-axial spaces. Small to moderate volume of pneumocephalus antidependently along the frontal convexities anteriorly. Surrounding T2/FLAIR hyperintensities is mildly improved and mass effect is mildly improved. Leftward midline shift now measures approximately 4 mm anteriorly. No suspicious residual enhancement, although postoperative left limits assessment. Mm continued attention on follow-up.   No evidence of acute infarct or hydrocephalus.   Vascular: Major arterial flow voids are maintained at the skull base.   Skull and upper cervical spine: Normal marrow signal.   Sinuses/Orbits: Clear sinuses.  No acute orbital findings.   Other: No  mastoid effusions.   IMPRESSION: Expected postoperative findings after debulking of a large right frontal mass, detailed above.  05/05/23 MRI HEAD WITHOUT AND WITH CONTRAST   TECHNIQUE: Multiplanar, multiecho pulse sequences of the brain and surrounding structures were obtained without and with intravenous contrast.   CONTRAST:  9mL GADAVIST GADOBUTROL 1 MMOL/ML IV SOLN   COMPARISON:  05/05/2023 CTA head neck   FINDINGS: Brain: There is a large, predominantly peripherally contrast-enhancing mass located within the right frontal lobe, measuring 6.8 x  4.9 cm. There is a large amount of surrounding hyperintense T2-weighted signal. There is marked mass effect on the right lateral ventricle and leftward midline shift 6 mm. There is early subfalcine herniation of the cingulate gyrus. No acute hemorrhage. The central necrotic component of the mass does not restrict diffusion.   Vascular: Major flow voids are preserved.   Skull and upper cervical spine: Normal calvarium and skull base. Visualized upper cervical spine and soft tissues are normal.   Sinuses/Orbits:No paranasal sinus fluid levels or advanced mucosal thickening. No mastoid or middle ear effusion. Normal orbits.   IMPRESSION: 1. Large, predominantly peripherally contrast-enhancing mass located within the right frontal lobe, measuring 6.8 x 4.9 cm. Large amount of surrounding hyperintense T2-weighted signal. This is most consistent with a high-grade glioma. 2. Marked mass effect on the right lateral ventricle and 6 mm leftward midline shift. Early subfalcine herniation of the cingulate gyrus.  COGNITION: Overall cognitive status: Within functional limits for tasks assessed   SENSATION: WFL  COORDINATION: Heel to shin: intact and WFL on B Alternating foot taps: intact and WFL  MUSCLE TONE: normal  MUSCLE LENGTH: Hamstrings: moderate restriction on B Gastrocsoleus: moderate restriction on B  POSTURE:  Standing: rounded shoulders, forward head, and decreased lumbar lordosis  LOWER EXTREMITY ROM:     Active  Right Eval Left Eval  Hip flexion [facility]  Hip extension [facility]  Hip abduction [facility]  Hip adduction    Hip internal rotation    Hip external rotation    Knee flexion [facility] WFL  Knee extension [facility]  Ankle dorsiflexion [facility] WFL  Ankle plantarflexion [facility] WFL  Ankle inversion    Ankle eversion     (Blank rows = not tested)  LOWER EXTREMITY MMT:    MMT Right Eval Left Eval  Hip flexion 3+ 3+  Hip extension 2+ 2+  Hip abduction 4- 3+  Hip adduction    Hip internal rotation    Hip external rotation    Knee flexion 4+ 4+  Knee extension 4+ 3+  Ankle dorsiflexion 4+ 4+  Ankle plantarflexion 4+ 4+  Ankle inversion    Ankle eversion    (Blank rows = not tested)  BED MOBILITY:  Sit to supine Complete Independence Supine to sit Complete Independence Rolling to Right Complete Independence Rolling to Left Complete Independence  TRANSFERS: Assistive device utilized: Single point cane  Sit to stand: Modified independence Stand to sit: Modified independence  STAIRS: Level of Assistance: Modified independence Stair Negotiation Technique: Alternating Pattern  with Bilateral Rails Number of Stairs: 4  Height of Stairs: 7"  Comments: Little to no unsteadiness noted  GAIT: Gait pattern: decreased hip/knee flexion- Right, decreased hip/knee flexion- Left, and trendelenburg Distance walked: 308 ft Assistive device utilized: Single point cane Level of assistance: Modified independence Comments: done during  FUNCTIONAL TESTS:  5 times sit to stand: 18.30 sec 2 minute walk test: 308 ft Dynamic Gait Index: 15 Tinetti (balance + gait) = 19   DGI 1. Gait level surface (2) Mild Impairment: Walks 20', uses assistive devices, slower speed, mild gait deviations. 2. Change in gait speed (3) Normal: Able to smoothly change walking speed without loss of balance or  gait deviation. Shows a significant difference in walking speeds between normal, fast and slow speeds. 3. Gait with horizontal head turns (1) Moderate Impairment: Performs head turns with moderate change in gait velocity, slows down, staggers but recovers, can continue to walk. 4. Dellia Nims with vertical head  turns  1) Moderate Impairment: Performs head turns with moderate change in gait velocity, slows down, staggers but recovers, can continue to walk. 5. Gait and pivot turn (2) Mild Impairment: Pivot turns safely in > 3 seconds and stops with no loss of balance. 6. Step over obstacle (2) Mild Impairment: Is able to step over box, but must slow down and adjust steps to clear box safely. 7. Step around obstacles (2) Mild Impairment: Is able to step around both cones, but must slow down and adjust steps to clear cones. 8. Stairs (2) Mild Impairment: Alternating feet, must use rail.  TOTAL SCORE: 15 / 24   PATIENT SURVEYS:  LEFS 51/80 = 63.7%  TODAY'S TREATMENT:                                                                                                                              DATE:  06/25/23 Nustep level 4 seat 12;  5 minutes Standing: Heelraises on incline x 20 light UE assist Toe raises on decline x 10 light UE assist Tandem stance 2 x 30" sec no UE assist  Lunges onto 4" no UE assist 2X10 each Vectors 5X 3" each with 1 UE assist SLS x 20" each occassional UE assist 2# hip abduction 2 x 10 2# hip extension 2 x 10 2# marching 2 x 10 Sit to stand 2 x 5 no UE assist  06/23/23 Standing: Heels raises on incline x 20 light UE assist Toe raises on decline x 10 light UE assist Sit to stand 2 x 5 no UE assist Tandem stance 2 x 30" sec no UE assist SLS x 20" each occassional UE assist 2# hip abduction 2 x 10 2# hip extension 2 x 10 2# marching 2 x 10   06/15/23 Sit to stand x 10 Standing: March x 10  Squat x 10 Tandem stance x 5 with Rt in front x 5 with left in front.    Supine bridge x 10 SLR x 10 Knee to chest 30 x 3"  Side lying  hip abduction x 10 B 06/10/23 Evaluation and patient education    PATIENT EDUCATION: Education details: Educated on the goals and course of rehab. Education on measures to reduce falls at home. Person educated: Patient and Spouse Education method: Explanation Education comprehension: verbal cues required  HOME EXERCISE PROGRAM: 06/15/23 Access Code: [email or web address] URL: [email or web address] Date: 06/15/2023 Prepared by: Virgina Organ  Exercises - Supine Bridge  - 1 x daily - 7 x weekly - 1 sets - 10 reps - 5" hold - Heel Raises with Counter Support  - 1 x daily - 7 x weekly - 1 sets - 10 reps - 5" hold - Standing March with Counter Support  - 1 x daily - 7 x weekly - 1 sets - 10 reps - 5" hold - Mini Squat with Counter Support  - 1 x daily - 7 x weekly - 1 sets - 10  reps - 5" hold - Sit to Stand Without Arm Support  - 1 x daily - 7 x weekly - 1 sets - 10 reps  GOALS: Goals reviewed with patient? Yes  SHORT TERM GOALS: Target date: 06/24/23  Pt will demonstrate indep in HEP to facilitate carry-over of skilled services and improve functional outcomes Goal status: on-going  LONG TERM GOALS: Target date: 07/08/23  Pt will decrease 5TSTS by at least 3 seconds in order to demonstrate clinically significant improvement in LE strength  Baseline: 18.30 sec Goal status: on-going  2.  Pt will improve DGI by at least 3 points in order to demonstrate clinically significant improvement in balance and decreased risk for falls  Baseline: 15 Goal status: on-going  3.  Patient will demonstrate increase in Tinetti Score by 3 points in order to demonstrate clinically significant improvement in balance and decreased risk for falls Baseline: 19 Goal status: on-going  4.  Pt will increase by at least 40 ft in order to demonstrate clinically significant improvement in community ambulation Baseline: 308 ft Goal  status: on-going  5.  Pt will increase LEFS by at least 9 points in order to demonstrate significant improvement in lower extremity function.  Baseline: 51 Goal status: on-going 6.  Pt will demonstrate increase in LE strength to 4+/5 to facilitate ease and safety in ambulation Baseline: 2+/5 Goal status: on-going  ASSESSMENT:  CLINICAL IMPRESSION: Began with nustep for functional warm up, LE mobility.  Focus continued on LE strength and stability.  Began vectors and lunges to further increase LE balance.  Pt able to complete all exercises with minimal rest breaks or cues.  Continued with added weight to standing exercise with good challenge with cues for avoiding trunk substitution.  PT will benefit from skilled PT  to address his functional impairments and limitations.   OBJECTIVE IMPAIRMENTS: Abnormal gait, decreased activity tolerance, decreased balance, difficulty walking, decreased strength, and impaired flexibility.   ACTIVITY LIMITATIONS: carrying, lifting, bending, standing, squatting, stairs, and transfers  PARTICIPATION LIMITATIONS: meal prep, cleaning, laundry, driving, shopping, community activity, and yard work  PERSONAL FACTORS: Age are also affecting patient's functional outcome.   REHAB POTENTIAL: Good  CLINICAL DECISION MAKING: Stable/uncomplicated  EVALUATION COMPLEXITY: Low  PLAN:  PT FREQUENCY: 2x/week  PT DURATION: 4 weeks  PLANNED INTERVENTIONS: Therapeutic exercises, Therapeutic activity, Neuromuscular re-education, Balance training, Gait training, Patient/Family education, Self Care, Stair training, and Manual therapy  PLAN FOR NEXT SESSION: Progress LE flexibility, balance, gait and strengthening activities.    Lurena Nida, PTA/CLT [facility] Ph: [phone]  2:33 PM, 06/25/23

## 2023-06-25 NOTE — Addendum Note (Signed)
Addended byConrad Bell Arthur D on: 06/25/2023 03:40 PM   Modules accepted: Orders

## 2023-06-26 ENCOUNTER — Other Ambulatory Visit: Payer: Self-pay

## 2023-06-26 ENCOUNTER — Ambulatory Visit
Admission: RE | Admit: 2023-06-26 | Discharge: 2023-06-26 | Disposition: A | Discharge status: Home / Self Care | Payer: Medicare Other | Source: Ambulatory Visit | Attending: Radiation Oncology

## 2023-06-26 ENCOUNTER — Other Ambulatory Visit (HOSPITAL_COMMUNITY): Payer: Self-pay

## 2023-06-26 DIAGNOSIS — C711 Malignant neoplasm of frontal lobe: Secondary | ICD-10-CM | POA: Diagnosis not present

## 2023-06-26 DIAGNOSIS — Z510 Encounter for antineoplastic radiation therapy: Secondary | ICD-10-CM | POA: Diagnosis not present

## 2023-06-26 LAB — RAD ONC ARIA SESSION SUMMARY
Course Elapsed Days: 8
Plan Fractions Treated to Date: 7
Plan Prescribed Dose Per Fraction: 2 Gy
Plan Total Fractions Prescribed: 23
Plan Total Prescribed Dose: 46 Gy
Reference Point Dosage Given to Date: 14 Gy
Reference Point Session Dosage Given: 2 Gy
Session Number: 7

## 2023-06-26 NOTE — Progress Notes (Signed)
Specialty Pharmacy Ongoing Clinical Assessment Note  Alex Church is a 77 y.o. male who is being followed by the specialty pharmacy service for RxSp Oncology   Patient's specialty medication(s) reviewed today: Temozolomide   Missed doses in the last 4 weeks: 0   Patient/Caregiver did not have any additional questions or concerns.   Therapeutic benefit summary: Unable to assess   Adverse events/side effects summary: No adverse events/side effects   Patient's therapy is appropriate to: Continue    Goals Addressed             This Visit's Progress    Slow Disease Progression       Patient is on track. Patient will maintain adherence         Follow up:  3 months   Bobette Mo Specialty Pharmacist

## 2023-06-29 ENCOUNTER — Telehealth: Payer: Medicare Other | Admitting: Internal Medicine

## 2023-06-29 ENCOUNTER — Telehealth: Payer: Self-pay | Admitting: Internal Medicine

## 2023-06-29 ENCOUNTER — Ambulatory Visit
Admission: RE | Admit: 2023-06-29 | Discharge: 2023-06-29 | Disposition: A | Discharge status: Home / Self Care | Payer: Medicare Other | Source: Ambulatory Visit | Attending: Radiation Oncology | Admitting: Radiation Oncology

## 2023-06-29 ENCOUNTER — Ambulatory Visit
Admission: RE | Admit: 2023-06-29 | Discharge: 2023-06-29 | Disposition: A | Discharge status: Home / Self Care | Payer: Medicare Other | Source: Ambulatory Visit | Attending: Radiation Oncology

## 2023-06-29 ENCOUNTER — Other Ambulatory Visit: Payer: Self-pay

## 2023-06-29 ENCOUNTER — Other Ambulatory Visit (HOSPITAL_COMMUNITY): Payer: Self-pay

## 2023-06-29 DIAGNOSIS — Z510 Encounter for antineoplastic radiation therapy: Secondary | ICD-10-CM | POA: Diagnosis not present

## 2023-06-29 DIAGNOSIS — C711 Malignant neoplasm of frontal lobe: Secondary | ICD-10-CM | POA: Diagnosis not present

## 2023-06-29 LAB — RAD ONC ARIA SESSION SUMMARY
Course Elapsed Days: 11
Plan Fractions Treated to Date: 8
Plan Prescribed Dose Per Fraction: 2 Gy
Plan Total Fractions Prescribed: 23
Plan Total Prescribed Dose: 46 Gy
Reference Point Dosage Given to Date: 16 Gy
Reference Point Session Dosage Given: 2 Gy
Session Number: 8

## 2023-06-29 LAB — URINE CULTURE
MICRO NUMBER:: 15608047
SPECIMEN QUALITY:: ADEQUATE

## 2023-06-29 MED ORDER — CIPROFLOXACIN HCL 500 MG PO TABS
500.0000 mg | ORAL_TABLET | Freq: Two times a day (BID) | ORAL | 0 refills | Status: DC
  Filled 2023-06-29: qty 10, 5d supply, fill #0

## 2023-06-29 NOTE — Telephone Encounter (Signed)
LMOM informing Pt's wife [name] of results and recommendations. Asked that she send [place] a mychart message to let us know if Pt has catheter in place or not.

## 2023-06-29 NOTE — Telephone Encounter (Signed)
Call patient's wife or patient: Urine culture shows Pseudomonas. Please be sure that the patient does not have a  indwelling bladder catheter. Plan: Stop Bactrim Start ciprofloxacin x 5 days.  Rx sent If symptoms continue needs to be seen.

## 2023-06-30 ENCOUNTER — Other Ambulatory Visit: Payer: Self-pay

## 2023-06-30 ENCOUNTER — Ambulatory Visit (HOSPITAL_COMMUNITY): Payer: Medicare Other | Admitting: Physical Therapy

## 2023-06-30 ENCOUNTER — Ambulatory Visit (HOSPITAL_COMMUNITY): Payer: Medicare Other | Admitting: Occupational Therapy

## 2023-06-30 ENCOUNTER — Ambulatory Visit
Admission: RE | Admit: 2023-06-30 | Discharge: 2023-06-30 | Disposition: A | Discharge status: Home / Self Care | Payer: Medicare Other | Source: Ambulatory Visit | Attending: Radiation Oncology

## 2023-06-30 ENCOUNTER — Encounter (HOSPITAL_COMMUNITY): Payer: Self-pay | Admitting: Occupational Therapy

## 2023-06-30 DIAGNOSIS — R262 Difficulty in walking, not elsewhere classified: Secondary | ICD-10-CM | POA: Diagnosis not present

## 2023-06-30 DIAGNOSIS — R29818 Other symptoms and signs involving the nervous system: Secondary | ICD-10-CM

## 2023-06-30 DIAGNOSIS — Z510 Encounter for antineoplastic radiation therapy: Secondary | ICD-10-CM | POA: Diagnosis not present

## 2023-06-30 DIAGNOSIS — R278 Other lack of coordination: Secondary | ICD-10-CM | POA: Diagnosis not present

## 2023-06-30 DIAGNOSIS — R29898 Other symptoms and signs involving the musculoskeletal system: Secondary | ICD-10-CM | POA: Diagnosis not present

## 2023-06-30 DIAGNOSIS — C711 Malignant neoplasm of frontal lobe: Secondary | ICD-10-CM | POA: Diagnosis not present

## 2023-06-30 LAB — RAD ONC ARIA SESSION SUMMARY
Course Elapsed Days: 12
Plan Fractions Treated to Date: 9
Plan Prescribed Dose Per Fraction: 2 Gy
Plan Total Fractions Prescribed: 23
Plan Total Prescribed Dose: 46 Gy
Reference Point Dosage Given to Date: 18 Gy
Reference Point Session Dosage Given: 2 Gy
Session Number: 9

## 2023-06-30 NOTE — Therapy (Unsigned)
OUTPATIENT OCCUPATIONAL THERAPY NEURO TREATMENT NOTE  Patient Name: Alex Church MRN: [id number] DOB:November 09, 1945, 77 y.o., male Today's Date: 06/30/2023  PCP: Willow Ora, MD REFERRING PROVIDER: Delle Reining, PA-C  END OF SESSION:     Past Medical History:  Diagnosis Date   B12 deficiency    monthly shots   Diabetes mellitus    Glaucoma suspect    Hyperlipemia    Hypertension    Past Surgical History:  Procedure Laterality Date   APPLICATION OF CRANIAL NAVIGATION Right 05/12/2023   Procedure: APPLICATION OF CRANIAL NAVIGATION;  Surgeon: Bethann Goo, DO;  Location: [facility] OR;  Service: Neurosurgery;  Laterality: Right;   CRANIOTOMY Right 05/12/2023   Procedure: RIGHT STERIOSTACTIC FRONTAL CRANIOTOMY FOR TUMOR RESECTION;  Surgeon: Bethann Goo, DO;  Location: [facility] OR;  Service: Neurosurgery;  Laterality: Right;   NO PAST SURGERIES     Patient Active Problem List   Diagnosis Date Noted   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   Glioblastoma, 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   Left-sided weakness 05/06/2023   PVC (premature ventricular contraction) 08/27/2022   Glaucoma suspect 04/30/2020   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  Rationale for Evaluation and Treatment: Rehabilitation  SUBJECTIVE:   SUBJECTIVE STATEMENT: "I'm really doing alright." Pt accompanied by: self and significant other  PERTINENT HISTORY: The patient was admitted to [facility] on 05/15/2023 and discharged on 05/23/2023 to Inpatient rehab and  spent 18 days A. 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  PRECAUTIONS: Fall  WEIGHT BEARING RESTRICTIONS: No  PAIN:  Are you having pain? No  FALLS: Has patient fallen in last 6 months? Yes. Number of falls "multiple"  LIVING ENVIRONMENT: Lives with: lives with their family Lives in: House/apartment Stairs: Yes: Internal: 13 steps; can reach both and External: 3 steps; none Has following equipment at home: Single point cane, Walker - 2 wheeled, shower chair, bed side commode, and Grab bars  PLOF: Independent  PATIENT GOALS: "I'd like to get back to where I was at"  OBJECTIVE:  Note: Objective measures were completed at Evaluation unless otherwise noted.  HAND DOMINANCE: Right  ADLs: Overall ADLs: Pt reports difficulty with lifting and carrying heavy items, specifically, yard work, cleaning, and cooking.   MOBILITY STATUS: Hx of falls  POSTURE COMMENTS:  rounded shoulders Sitting balance: Moves/returns truncal midpoint 1-2 inches in multiple planes  ACTIVITY TOLERANCE: Activity tolerance: Mildly limited  FUNCTIONAL OUTCOME MEASURES: FOTO: 57.89/100  UPPER EXTREMITY ROM:    All Motions WFL  UPPER EXTREMITY MMT:     MMT Right eval Left eval  Shoulder flexion 4/5 4-/5  Shoulder abduction 5/5 4+/5  Shoulder adduction 5/5 5/5  Shoulder extension 4+/5 4+/5  Shoulder internal rotation 4+/5 4/5  Shoulder external rotation 4+/5 4/5  Elbow flexion 5/5 4+/5  Elbow extension 5/5 4/5  Wrist flexion 5/5 4+/5  Wrist extension 5/5 4+/5  Wrist ulnar deviation 5/5 5/5  Wrist radial deviation 5/5 5/5  Wrist pronation 5/5 5/5  Wrist supination 5/5 5/5  (Blank rows = not tested)  HAND FUNCTION: Grip strength: Right: 68 lbs; Left: 64 lbs, Lateral pinch: Right: 19 lbs, Left: 18 lbs, and 3 point pinch: Right: 15 lbs, Left: 12 lbs  COORDINATION: 9 Hole Peg test: Right: 30.87 sec; Left: 34.80  sec  SENSATION: WFL  EDEMA: No swelling noted  OBSERVATIONS: Mildly slowed movements in the hands   TODAY'S TREATMENT:                                                                                                                              DATE:   06/30/23 -Scapular Strengthening: -Shoulder Strengthening: -Theraputty: red putty -  06/24/23 -Shoulder Strengthening: 2lb dumbbell,  flexion, abduction, protraction, horizontal abduction, er/IR, x10 -Scapular Strengthening: green band, extension, retraction, rows, x10 -Wrist Strengthening: 2lb dumbbell, extension, flexion, ulnar/radial deviation, supination/pronation, x10 -Gripper: 42#, 5 medium beads each hand, 4 small beads each hand -Tiny peg board: holding 3-4 at a time and placing them in the board, mod difficulty not using opposite hand to help with manipulating pegs  PATIENT EDUCATION: Education details: Public relations account executive  Person educated: Patient Education method: Explanation Education comprehension: verbalized understanding  HOME EXERCISE PROGRAM: 10/16: Shoulder strengthening   GOALS: Goals reviewed with patient? Yes  SHORT TERM GOALS: Target date: 07/31/23  Pt will be provided comprehensive HEP for BUE strengthening in order to complete function tasks independently.   Goal status: IN PROGRESS  2.  Pt will improve BUE strength in order to complete cooking and cleaning at home independently.   Goal status: IN PROGRESS  3.  Pt will increased BUE grip by 10lbs and pinch by 2lbs in order to maintain grasp during yard work tasks.   Goal status: IN PROGRESS  4.  Pt will increase BUE coordination to 27 seconds or quicker in order to improve effectiveness of manipulating buttons, zippers, and clasps.   Goal status: IN PROGRESS   ASSESSMENT:  CLINICAL IMPRESSION: Patient presenting to his first OT Session with good mobility and agreeable to all exercises. He worked on Film/video editor, as  well as fine motor coordination. When working on fine motor coordination, pt had increased difficulty with in hand translation, attempting to use BUE to manipulate a small handful of  tiny pegs. OT providing verbal and tactile cuing for positioning and technique.   PERFORMANCE DEFICITS: in functional skills including ADLs, IADLs, coordination, ROM, strength, Fine motor control, Gross motor control, body mechanics, and UE functional use.    PLAN:  OT FREQUENCY: 1-2x/week  OT DURATION: 6 weeks  PLANNED INTERVENTIONS: self care/ADL training, therapeutic exercise, therapeutic activity, neuromuscular re-education, manual therapy, passive range of motion, functional mobility training, electrical stimulation, ultrasound, paraffin, moist heat, patient/family education, coping strategies training, and DME and/or AE instructions  RECOMMENDED OTHER SERVICES: N/A  CONSULTED AND AGREED WITH PLAN OF CARE: Patient  PLAN FOR NEXT SESSION: Manual therapy as needed, shoulder strengthening, wrist strengthening, grip and  pinch strengthening, stabilization.   Trish Mage, OTR/L [facility] [phone] Kennyth Arnold, OT 06/30/2023, 2:47 PM

## 2023-06-30 NOTE — Therapy (Signed)
OUTPATIENT PHYSICAL THERAPY NEURO Treatment    Patient Name: Alex Church MRN: [id number] DOB:1946-02-22, 77 y.o., male Today's Date: 06/30/2023   PCP: Wanda Plump, MD REFERRING PROVIDER: Jacquelynn Cree, PA-C  END OF SESSION:  PT End of Session - 06/30/23 1557     Visit Number 5    Number of Visits 8    Date for PT Re-Evaluation 07/08/23    Authorization Type [employer] Medicare    Progress Note Due on Visit 8    PT Start Time 1518    PT Stop Time 1600    PT Time Calculation (min) 42 min    Activity Tolerance Patient tolerated treatment well    Behavior During Therapy WFL for tasks assessed/performed               Past Medical History:  Diagnosis Date   B12 deficiency    monthly shots   Diabetes mellitus    Glaucoma suspect    Hyperlipemia    Hypertension    Past Surgical History:  Procedure Laterality Date   APPLICATION OF CRANIAL NAVIGATION Right 05/12/2023   Procedure: APPLICATION OF CRANIAL NAVIGATION;  Surgeon: Bethann Goo, DO;  Location: [facility] OR;  Service: Neurosurgery;  Laterality: Right;   CRANIOTOMY Right 05/12/2023   Procedure: RIGHT STERIOSTACTIC FRONTAL CRANIOTOMY FOR TUMOR RESECTION;  Surgeon: Bethann Goo, DO;  Location: [facility] OR;  Service: Neurosurgery;  Laterality: Right;   NO PAST SURGERIES     Patient Active Problem List   Diagnosis Date Noted   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   Glioblastoma, 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   Left-sided weakness 05/06/2023   PVC (premature ventricular contraction) 08/27/2022   Glaucoma suspect 04/30/2020   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  Rationale for Evaluation and Treatment: Rehabilitation  SUBJECTIVE:                                                                                                                                                                                             SUBJECTIVE STATEMENT: Pt reports 9 radiation treatments down and 21 more to go.  Taking  his chemo pill at night.  Just had OT and without any new issues   PERTINENT HISTORY: PVC  PAIN:  Are you having pain? No  PRECAUTIONS: Fall  RED FLAGS: Bowel or bladder incontinence: Yes: recently diagnosed with UTI    WEIGHT BEARING RESTRICTIONS: No  FALLS: Has patient fallen in last 6 months? Yes. Number of falls 2 (most recent was in August 2024)  LIVING ENVIRONMENT: Lives with: lives with their spouse Lives in: House/apartment Stairs: Yes: Internal: 12-13 steps; can reach both and External: 5 steps; on right going up Has following equipment at home: Single point cane, Walker - 2 wheeled, and Wheelchair (manual)  PLOF: Independent and Independent with basic ADLs  PATIENT GOALS: Wife wishes for the patient's legs to be strong again  OBJECTIVE:  Note: Objective measures were completed at Evaluation unless otherwise noted.  DIAGNOSTIC FINDINGS:  05/13/23 MRI HEAD WITHOUT AND WITH CONTRAST   TECHNIQUE: Multiplanar, multiecho pulse sequences of the brain and surrounding structures were obtained without and with intravenous contrast.   CONTRAST:  8mL GADAVIST GADOBUTROL 1 MMOL/ML IV SOLN   COMPARISON:  MRI head 05/05/2023.   FINDINGS: Brain: Interval debulking of a right frontal lesion appears larger section cavity with expected postoperative blood products in the resection cavity as well as overlying extra-axial spaces. Small to moderate volume of pneumocephalus  antidependently along the frontal convexities anteriorly. Surrounding T2/FLAIR hyperintensities is mildly improved and mass effect is mildly improved. Leftward midline shift now measures approximately 4 mm anteriorly. No suspicious residual enhancement, although postoperative left limits assessment. Mm continued attention on follow-up.   No evidence of acute infarct or hydrocephalus.   Vascular: Major arterial flow voids are maintained at the skull base.   Skull and upper cervical spine: Normal marrow signal.   Sinuses/Orbits: Clear sinuses.  No acute orbital findings.   Other: No mastoid effusions.   IMPRESSION: Expected postoperative findings after debulking of a large right frontal mass, detailed above.MRI HEAD WITHOUT AND WITH CONTRAST   TECHNIQUE: Multiplanar, multiecho pulse sequences of the brain and surrounding structures were obtained without and with intravenous contrast.   CONTRAST:  8mL GADAVIST GADOBUTROL 1 MMOL/ML IV SOLN   COMPARISON:  MRI head 05/05/2023.   FINDINGS: Brain: Interval debulking of a right frontal lesion appears larger section cavity with expected postoperative blood products in the resection cavity as well as overlying extra-axial spaces. Small to moderate volume of pneumocephalus antidependently along the frontal convexities anteriorly. Surrounding T2/FLAIR hyperintensities is mildly improved and mass effect is mildly improved. Leftward midline shift now measures approximately 4 mm anteriorly. No suspicious residual enhancement, although postoperative left limits assessment. Mm continued attention on follow-up.   No evidence of acute infarct or hydrocephalus.   Vascular: Major arterial flow voids are maintained at the skull base.   Skull and upper cervical spine: Normal marrow signal.   Sinuses/Orbits: Clear sinuses.  No acute orbital findings.   Other: No mastoid effusions.   IMPRESSION: Expected postoperative findings after  debulking of a large right frontal mass, detailed above.  05/05/23 MRI HEAD WITHOUT AND WITH CONTRAST   TECHNIQUE: Multiplanar, multiecho pulse sequences of the brain and surrounding structures were obtained without and with intravenous contrast.   CONTRAST:  9mL GADAVIST GADOBUTROL 1 MMOL/ML IV SOLN   COMPARISON:  05/05/2023 CTA head neck   FINDINGS: Brain: There is a large, predominantly peripherally contrast-enhancing mass located within the right frontal lobe, measuring 6.8 x 4.9 cm. There is a large amount of surrounding hyperintense  T2-weighted signal. There is marked mass effect on the right lateral ventricle and leftward midline shift 6 mm. There is early subfalcine herniation of the cingulate gyrus. No acute hemorrhage. The central necrotic component of the mass does not restrict diffusion.   Vascular: Major flow voids are preserved.   Skull and upper cervical spine: Normal calvarium and skull base. Visualized upper cervical spine and soft tissues are normal.   Sinuses/Orbits:No paranasal sinus fluid levels or advanced mucosal thickening. No mastoid or middle ear effusion. Normal orbits.   IMPRESSION: 1. Large, predominantly peripherally contrast-enhancing mass located within the right frontal lobe, measuring 6.8 x 4.9 cm. Large amount of surrounding hyperintense T2-weighted signal. This is most consistent with a high-grade glioma. 2. Marked mass effect on the right lateral ventricle and 6 mm leftward midline shift. Early subfalcine herniation of the cingulate gyrus.  COGNITION: Overall cognitive status: Within functional limits for tasks assessed   SENSATION: WFL  COORDINATION: Heel to shin: intact and WFL on B Alternating foot taps: intact and WFL  MUSCLE TONE: normal  MUSCLE LENGTH: Hamstrings: moderate restriction on B Gastrocsoleus: moderate restriction on B  POSTURE: Standing: rounded shoulders, forward head, and decreased lumbar  lordosis  LOWER EXTREMITY ROM:     Active  Right Eval Left Eval  Hip flexion [facility]  Hip extension [facility]  Hip abduction [facility]  Hip adduction    Hip internal rotation    Hip external rotation    Knee flexion [facility] WFL  Knee extension [facility]  Ankle dorsiflexion [facility] WFL  Ankle plantarflexion [facility] WFL  Ankle inversion    Ankle eversion     (Blank rows = not tested)  LOWER EXTREMITY MMT:    MMT Right Eval Left Eval  Hip flexion 3+ 3+  Hip extension 2+ 2+  Hip abduction 4- 3+  Hip adduction    Hip internal rotation    Hip external rotation    Knee flexion 4+ 4+  Knee extension 4+ 3+  Ankle dorsiflexion 4+ 4+  Ankle plantarflexion 4+ 4+  Ankle inversion    Ankle eversion    (Blank rows = not tested)  BED MOBILITY:  Sit to supine Complete Independence Supine to sit Complete Independence Rolling to Right Complete Independence Rolling to Left Complete Independence  TRANSFERS: Assistive device utilized: Single point cane  Sit to stand: Modified independence Stand to sit: Modified independence  STAIRS: Level of Assistance: Modified independence Stair Negotiation Technique: Alternating Pattern  with Bilateral Rails Number of Stairs: 4  Height of Stairs: 7"  Comments: Little to no unsteadiness noted  GAIT: Gait pattern: decreased hip/knee flexion- Right, decreased hip/knee flexion- Left, and trendelenburg Distance walked: 308 ft Assistive device utilized: Single point cane Level of assistance: Modified independence Comments: done during  FUNCTIONAL TESTS:  5 times sit to stand: 18.30 sec 2 minute walk test: 308 ft Dynamic Gait Index: 15 Tinetti (balance + gait) = 19   DGI 1. Gait level surface (2) Mild Impairment: Walks 20', uses assistive devices, slower speed, mild gait deviations. 2. Change in gait speed (3) Normal: Able to smoothly change walking speed without loss of balance or gait deviation. Shows a significant difference in walking speeds  between normal, fast and slow speeds. 3. Gait with horizontal head turns (1) Moderate Impairment: Performs head turns with moderate change in gait velocity, slows down, staggers but recovers, can continue to walk. 4. Dellia Nims with vertical head turns  1) Moderate Impairment: Performs head turns with moderate  change in gait velocity, slows down, staggers but recovers, can continue to walk. 5. Gait and pivot turn (2) Mild Impairment: Pivot turns safely in > 3 seconds and stops with no loss of balance. 6. Step over obstacle (2) Mild Impairment: Is able to step over box, but must slow down and adjust steps to clear box safely. 7. Step around obstacles (2) Mild Impairment: Is able to step around both cones, but must slow down and adjust steps to clear cones. 8. Stairs (2) Mild Impairment: Alternating feet, must use rail.  TOTAL SCORE: 15 / 24   PATIENT SURVEYS:  LEFS 51/80 = 63.7%  TODAY'S TREATMENT:                                                                                                                              DATE:  06/30/23 Nustep level 4 seat 10;  5 minutes UE/LE Standing: Heelraises on incline x 20 light UE assist Toe raises on decline x20 light UE assist Lunges onto 4" no UE assist 2X10 each 6" forward step ups 20X each LE 6" lateral step ups 20X each LE Vectors 5X 3" each with 1 UE assist 3# hip abduction 2 x 10 3# hip extension 2 x 10 3# marching 2 x 10 Sit to stand 2 x 5 no UE assist  06/25/23 Nustep level 4 seat 12;  5 minutes Standing: Heelraises on incline x 20 light UE assist Toe raises on decline x 10 light UE assist Tandem stance 2 x 30" sec no UE assist  Lunges onto 4" no UE assist 2X10 each Vectors 5X 3" each with 1 UE assist SLS x 20" each occassional UE assist 2# hip abduction 2 x 10 2# hip extension 2 x 10 2# marching 2 x 10 Sit to stand 2 x 5 no UE assist  06/23/23 Standing: Heels raises on incline x 20 light UE assist Toe raises on decline  x 10 light UE assist Sit to stand 2 x 5 no UE assist Tandem stance 2 x 30" sec no UE assist SLS x 20" each occassional UE assist 2# hip abduction 2 x 10 2# hip extension 2 x 10 2# marching 2 x 10   06/15/23 Sit to stand x 10 Standing: March x 10  Squat x 10 Tandem stance x 5 with Rt in front x 5 with left in front.   Supine bridge x 10 SLR x 10 Knee to chest 30 x 3"  Side lying  hip abduction x 10 B 06/10/23 Evaluation and patient education    PATIENT EDUCATION: Education details: Educated on the goals and course of rehab. Education on measures to reduce falls at home. Person educated: Patient and Spouse Education method: Explanation Education comprehension: verbal cues required  HOME EXERCISE PROGRAM: 06/15/23 Access Code: [email or web address] URL: [email or web address] Date: 06/15/2023 Prepared by: Virgina Organ  Exercises - Supine Bridge  - 1 x daily - 7 x weekly - 1 sets -  10 reps - 5" hold - Heel Raises with Counter Support  - 1 x daily - 7 x weekly - 1 sets - 10 reps - 5" hold - Standing March with Counter Support  - 1 x daily - 7 x weekly - 1 sets - 10 reps - 5" hold - Mini Squat with Counter Support  - 1 x daily - 7 x weekly - 1 sets - 10 reps - 5" hold - Sit to Stand Without Arm Support  - 1 x daily - 7 x weekly - 1 sets - 10 reps  GOALS: Goals reviewed with patient? Yes  SHORT TERM GOALS: Target date: 06/24/23  Pt will demonstrate indep in HEP to facilitate carry-over of skilled services and improve functional outcomes Goal status: on-going  LONG TERM GOALS: Target date: 07/08/23  Pt will decrease 5TSTS by at least 3 seconds in order to demonstrate clinically significant improvement in LE strength  Baseline: 18.30 sec Goal status: on-going  2.  Pt will improve DGI by at least 3 points in order to demonstrate clinically significant improvement in balance and decreased risk for falls  Baseline: 15 Goal status: on-going  3.  Patient will  demonstrate increase in Tinetti Score by 3 points in order to demonstrate clinically significant improvement in balance and decreased risk for falls Baseline: 19 Goal status: on-going  4.  Pt will increase by at least 40 ft in order to demonstrate clinically significant improvement in community ambulation Baseline: 308 ft Goal status: on-going  5.  Pt will increase LEFS by at least 9 points in order to demonstrate significant improvement in lower extremity function.  Baseline: 51 Goal status: on-going 6.  Pt will demonstrate increase in LE strength to 4+/5 to facilitate ease and safety in ambulation Baseline: 2+/5 Goal status: on-going  ASSESSMENT:  CLINICAL IMPRESSION: Began with nustep for functional warm up and mobility.  Overall doing well with good functional tolerance despite current chemo and radiation treatments daily. Continued with standing exercises with minimal rest breaks or cues.  Increased weight to 3# with standing exercise with good challenge with cues for avoiding trunk substitution. Pt also tends to rush through and reduce eccentric control requiring verbal cues.  Began repeated step ups using 6" step and bil UE's to work on LE strengthening.  PT will continue to benefit from skilled PT to address his functional impairments and limitations.   OBJECTIVE IMPAIRMENTS: Abnormal gait, decreased activity tolerance, decreased balance, difficulty walking, decreased strength, and impaired flexibility.   ACTIVITY LIMITATIONS: carrying, lifting, bending, standing, squatting, stairs, and transfers  PARTICIPATION LIMITATIONS: meal prep, cleaning, laundry, driving, shopping, community activity, and yard work  PERSONAL FACTORS: Age are also affecting patient's functional outcome.   REHAB POTENTIAL: Good  CLINICAL DECISION MAKING: Stable/uncomplicated  EVALUATION COMPLEXITY: Low  PLAN:  PT FREQUENCY: 2x/week  PT DURATION: 4 weeks  PLANNED INTERVENTIONS: Therapeutic  exercises, Therapeutic activity, Neuromuscular re-education, Balance training, Gait training, Patient/Family education, Self Care, Stair training, and Manual therapy  PLAN FOR NEXT SESSION: Progress LE flexibility, balance, gait and strengthening activities.   Lurena Nida, PTA/CLT [facility] Ph: [phone]  3:58 PM, 06/30/23

## 2023-07-01 ENCOUNTER — Other Ambulatory Visit: Payer: Self-pay

## 2023-07-01 ENCOUNTER — Ambulatory Visit
Admission: RE | Admit: 2023-07-01 | Discharge: 2023-07-01 | Disposition: A | Discharge status: Home / Self Care | Payer: Medicare Other | Source: Ambulatory Visit | Attending: Radiation Oncology | Admitting: Radiation Oncology

## 2023-07-01 DIAGNOSIS — C711 Malignant neoplasm of frontal lobe: Secondary | ICD-10-CM | POA: Diagnosis not present

## 2023-07-01 DIAGNOSIS — Z510 Encounter for antineoplastic radiation therapy: Secondary | ICD-10-CM | POA: Diagnosis not present

## 2023-07-01 LAB — RAD ONC ARIA SESSION SUMMARY
Course Elapsed Days: 13
Plan Fractions Treated to Date: 10
Plan Prescribed Dose Per Fraction: 2 Gy
Plan Total Fractions Prescribed: 23
Plan Total Prescribed Dose: 46 Gy
Reference Point Dosage Given to Date: 20 Gy
Reference Point Session Dosage Given: 2 Gy
Session Number: 10

## 2023-07-02 ENCOUNTER — Encounter: Payer: Self-pay | Admitting: Internal Medicine

## 2023-07-02 ENCOUNTER — Ambulatory Visit: Payer: Medicare Other

## 2023-07-02 ENCOUNTER — Inpatient Hospital Stay: Payer: Medicare Other | Attending: Internal Medicine

## 2023-07-02 ENCOUNTER — Inpatient Hospital Stay: Payer: Medicare Other | Admitting: Internal Medicine

## 2023-07-02 ENCOUNTER — Other Ambulatory Visit: Payer: Self-pay

## 2023-07-02 VITALS — BP 132/68 | HR 73 | Temp 97.7°F | Resp 18 | Wt 194.4 lb

## 2023-07-02 DIAGNOSIS — Z79899 Other long term (current) drug therapy: Secondary | ICD-10-CM | POA: Diagnosis not present

## 2023-07-02 DIAGNOSIS — C719 Malignant neoplasm of brain, unspecified: Secondary | ICD-10-CM | POA: Diagnosis not present

## 2023-07-02 DIAGNOSIS — C711 Malignant neoplasm of frontal lobe: Secondary | ICD-10-CM | POA: Diagnosis not present

## 2023-07-02 LAB — CBC WITH DIFFERENTIAL (CANCER CENTER ONLY)
Abs Immature Granulocytes: 0.1 10*3/uL — ABNORMAL HIGH (ref 0.00–0.07)
Basophils Absolute: 0.1 10*3/uL (ref 0.0–0.1)
Basophils Relative: 1 %
Eosinophils Absolute: 0.1 10*3/uL (ref 0.0–0.5)
Eosinophils Relative: 2 %
HCT: 34.4 % — ABNORMAL LOW (ref 39.0–52.0)
Hemoglobin: 10.8 g/dL — ABNORMAL LOW (ref 13.0–17.0)
Immature Granulocytes: 2 %
Lymphocytes Relative: 23 %
Lymphs Abs: 1.4 10*3/uL (ref 0.7–4.0)
MCH: 30.7 pg (ref 26.0–34.0)
MCHC: 31.4 g/dL (ref 30.0–36.0)
MCV: 97.7 fL (ref 80.0–100.0)
Monocytes Absolute: 0.6 10*3/uL (ref 0.1–1.0)
Monocytes Relative: 10 %
Neutro Abs: 3.6 10*3/uL (ref 1.7–7.7)
Neutrophils Relative %: 62 %
Platelet Count: 207 10*3/uL (ref 150–400)
RBC: 3.52 MIL/uL — ABNORMAL LOW (ref 4.22–5.81)
RDW: 16.6 % — ABNORMAL HIGH (ref 11.5–15.5)
WBC Count: 5.8 10*3/uL (ref 4.0–10.5)
nRBC: 0 % (ref 0.0–0.2)

## 2023-07-02 LAB — CMP (CANCER CENTER ONLY)
ALT: 12 U/L (ref 0–44)
AST: 15 U/L (ref 15–41)
Albumin: 3.4 g/dL — ABNORMAL LOW (ref 3.5–5.0)
Alkaline Phosphatase: 74 U/L (ref 38–126)
Anion gap: 6 (ref 5–15)
BUN: 14 mg/dL (ref 8–23)
CO2: 28 mmol/L (ref 22–32)
Calcium: 8.9 mg/dL (ref 8.9–10.3)
Chloride: 104 mmol/L (ref 98–111)
Creatinine: 1.12 mg/dL (ref 0.61–1.24)
GFR, Estimated: >60 mL/min (ref >60)
Glucose, Bld: 224 mg/dL — ABNORMAL HIGH (ref 70–99)
Potassium: 4.4 mmol/L (ref 3.5–5.1)
Sodium: 138 mmol/L (ref 135–145)
Total Bilirubin: 0.4 mg/dL (ref 0.3–1.2)
Total Protein: 6.1 g/dL — ABNORMAL LOW (ref 6.5–8.1)

## 2023-07-02 NOTE — Progress Notes (Signed)
[facility] [address]  [place], [place] [address] [phone]   Interval Evaluation  Date of Service: 07/02/23 Patient Name: Alex Church Patient MRN: [id number] Patient DOB: October 06, 1945 Provider: Henreitta Leber, MD  Identifying Statement:  Alex Church is a 77 y.o. male with right frontal glioblastoma   Oncologic History: Oncology History  Glioblastoma, IDH-wildtype (HCC)  05/12/2023 Surgery   Right frontal craniotomy, resection with Dr. Jake Samples; path is glioblastoma IDH-wt   06/09/2023 -  Chemotherapy   Patient is on Treatment Plan : BRAIN GLIOBLASTOMA Radiation Therapy With Concurrent Temozolomide 75 mg/m2 Daily Followed By Sequential Maintenance Temozolomide x 6-12 cycles          Interval History: Alex Church presents today for follow up, now having completed 2 weeks of radiation and Temodar.  He is currently dosing ciprofloxacin through his PCP for UTI, now asymptomatic.  No issues with RT or Temodar.  Remains active, independent.  No headaches or seizures.  H+P (06/02/23) Patient presented to neurologic attention in late August with several days history of left sided weakness, confusion.  Wife noticed he had difficulty getting out of bed, was dragging left side.  This was on top of several months history of progressive confusion, short term memory impairment.  CNS imaging demonstrated a large right frontal mass.  He underwent craniotomy and resection with Dr. Jake Samples, path demonstrated glioblastoma.  Since surgery he has been doing well, walking independently.  Denies seizures, headaches.  He is off decadron.   Medications: Current Outpatient Medications on File Prior to Visit  Medication Sig Dispense Refill   acetaminophen (TYLENOL) 325 MG tablet Take 1-2 tablets (325-650 mg total) by mouth every 4 (four) hours as needed for mild pain.     allopurinol (ZYLOPRIM) 100 MG tablet Take 1 tablet (100 mg total) by mouth 2 (two) times  daily. 180 tablet 1   atorvastatin (LIPITOR) 10 MG tablet Take 1 tablet (10 mg total) by mouth at bedtime. Due for appt 05/2023 90 tablet 0   Blood Glucose Monitoring Suppl (ONETOUCH VERIO FLEX SYSTEM) w/Device KIT Check blood sugar once daily 1 kit 0   carvedilol (COREG) 12.5 MG tablet Take 0.5 tablets (6.25 mg total) by mouth 2 (two) times daily with a meal. Due for appt 05/2023     ciprofloxacin (CIPRO) 500 MG tablet Take 1 tablet (500 mg total) by mouth 2 (two) times daily. 10 tablet 0   cloNIDine (CATAPRES) 0.1 MG tablet Take 1 tablet (0.1 mg total) by mouth 2 (two) times daily. 180 tablet 1   cyanocobalamin (VITAMIN B12) 1000 MCG tablet Take 1,000 mcg by mouth every other day.     glucose blood test strip Check blood sugars once daily 100 each 12   Lancets (ONETOUCH ULTRASOFT) lancets Check bloods sugars once daily 100 each 12   Magnesium Oxide 400 MG CAPS Take 2 capsules (800 mg total) by mouth 2 (two) times daily. 60 capsule 0   melatonin 5 MG TABS Take 1 tablet (5 mg total) by mouth at bedtime. 30 tablet 0   metFORMIN (GLUCOPHAGE) 1000 MG tablet Take 0.5 tablets (500 mg total) by mouth 2 (two) times daily with a meal.     ondansetron (ZOFRAN) 8 MG tablet Take 1 tablet (8 mg total) by mouth every 8 (eight) hours as needed for nausea or vomiting. May take 30-60 minutes prior to Temodar administration if nausea/vomiting occurs as needed. 30 tablet 1   pantoprazole (  PROTONIX) 40 MG tablet Take 1 tablet (40 mg total) by mouth 2 (two) times daily. 60 tablet 0   pioglitazone (ACTOS) 30 MG tablet Take 1 tablet (30 mg total) by mouth daily.     polyethylene glycol (MIRALAX / GLYCOLAX) 17 g packet Take 17 g by mouth daily as needed for mild constipation. 14 each 0   sitaGLIPtin (JANUVIA) 100 MG tablet Take 1 tablet (100 mg total) by mouth daily.     sodium chloride 1 g tablet Take 1 tablet (1 g total) by mouth 3 (three) times daily with meals. 90 tablet 0   tamsulosin (FLOMAX) 0.4 MG CAPS capsule Take  1 capsule (0.4 mg total) by mouth daily after supper. 90 capsule 0   temozolomide (TEMODAR) 140 MG capsule Take 1 capsule (140 mg total) by mouth at bedtime. May take on an empty stomach to decrease nausea & vomiting. 42 capsule 0   No current facility-administered medications on file prior to visit.    Allergies: No Known Allergies Past Medical History:  Past Medical History:  Diagnosis Date   B12 deficiency    monthly shots   Diabetes mellitus    Glaucoma suspect    Hyperlipemia    Hypertension    Past Surgical History:  Past Surgical History:  Procedure Laterality Date   APPLICATION OF CRANIAL NAVIGATION Right 05/12/2023   Procedure: APPLICATION OF CRANIAL NAVIGATION;  Surgeon: Dawley, Alan Mulder, DO;  Location: [facility] OR;  Service: Neurosurgery;  Laterality: Right;   CRANIOTOMY Right 05/12/2023   Procedure: RIGHT STERIOSTACTIC FRONTAL CRANIOTOMY FOR TUMOR RESECTION;  Surgeon: Bethann Goo, DO;  Location: [facility] OR;  Service: Neurosurgery;  Laterality: Right;   NO PAST SURGERIES     Social History:  Social History   Socioeconomic History   Marital status: Married    Spouse name: Not on file   Number of children: 0   Years of education: Not on file   Highest education level: GED or equivalent  Occupational History   Occupation: retired from [employer], works for a lab driving  Tobacco Use   Smoking status: Never   Smokeless tobacco: Never  Substance and Sexual Activity   Alcohol use: No   Drug use: No   Sexual activity: Not on file  Other Topics Concern   Not on file  Social History Narrative   Married (2nd marriage)   Lives w/ wife     Has a rescue dog   Social Determinants of Health   Financial Resource Strain: Low Risk  (06/09/2023)   Overall Financial Resource Strain (CARDIA)    Difficulty of Paying Living Expenses: Not hard at all  Food Insecurity: No Food Insecurity (06/09/2023)   Hunger Vital Sign    Worried About Running Out of Food in the Last Year: Never true    Ran  Out of Food in the Last Year: Never true  Transportation Needs: No Transportation Needs (06/09/2023)   PRAPARE - Administrator, Civil Service (Medical): No    Lack of Transportation (Non-Medical): No  Physical Activity: Unknown (06/09/2023)   Exercise Vital Sign    Days of Exercise per Week: 0 days    Minutes of Exercise per Session: Not on file  Stress: No Stress Concern Present (06/09/2023)   [employer] of Occupational Health - Occupational Stress Questionnaire    Feeling of Stress : Only a little  Social Connections: Moderately Isolated (06/09/2023)   Social Connection and Isolation Panel [NHANES]    Frequency  of Communication with Friends and Family: Three times a week    Frequency of Social Gatherings with Friends and Family: Twice a week    Attends Religious Services: Never    Database administrator or Organizations: No    Attends Engineer, structural: Not on file    Marital Status: Married  Catering manager Violence: Not At Risk (06/02/2023)   Humiliation, Afraid, Rape, and Kick questionnaire    Fear of Current or Ex-Partner: No    Emotionally Abused: No    Physically Abused: No    Sexually Abused: No   Family History:  Family History  Problem Relation Age of Onset   Diabetes Maternal Uncle    Heart attack Brother 13   Cancer Mother        intra-abdominal   Cirrhosis Cousin        maternal   Diabetes Cousin        maternal   Stroke Neg Hx    Colon cancer Neg Hx    Prostate cancer Neg Hx     Review of Systems: Constitutional: Doesn't report fevers, chills or abnormal weight loss Eyes: Doesn't report blurriness of vision Ears, nose, mouth, throat, and face: Doesn't report sore throat Respiratory: Doesn't report cough, dyspnea or wheezes Cardiovascular: Doesn't report palpitation, chest discomfort  Gastrointestinal:  Doesn't report nausea, constipation, diarrhea GU: Doesn't report incontinence Skin: Doesn't report skin  rashes Neurological: Per HPI Musculoskeletal: Doesn't report joint pain Behavioral/Psych: Doesn't report anxiety  Physical Exam: Vitals:   07/02/23 0905  BP: 132/68  Pulse: 73  Resp: 18  Temp: 97.7 F (36.5 C)  SpO2: 96%   KPS: 80. General: Alert, cooperative, pleasant, in no acute distress Head: Normal EENT: No conjunctival injection or scleral icterus.  Lungs: Resp effort normal Cardiac: Regular rate Abdomen: Non-distended abdomen Skin: No rashes cyanosis or petechiae. Extremities: No clubbing or edema  Neurologic Exam: Mental Status: Awake, alert, attentive to examiner. Oriented to self and environment. Language is fluent with intact comprehension.  Cranial Nerves: Visual acuity is grossly normal. Visual fields are full. Extra-ocular movements intact. No ptosis. Face is symmetric Motor: Tone and bulk are normal. Power is full in both arms and legs. Reflexes are symmetric, no pathologic reflexes present.  Sensory: Intact to light touch Gait: Normal.   Labs: I have reviewed the data as listed    Component Value Date/Time   NA 138 07/02/2023 0845   NA 139 04/06/2022 1047   K 4.4 07/02/2023 0845   CL 104 07/02/2023 0845   CO2 28 07/02/2023 0845   GLUCOSE 224 (H) 07/02/2023 0845   GLUCOSE 147 (H) 08/26/2006 1051   BUN 14 07/02/2023 0845   BUN 17 04/06/2022 1047   CREATININE 1.12 07/02/2023 0845   CREATININE 1.09 06/20/2020 1509   CALCIUM 8.9 07/02/2023 0845   PROT 6.1 (L) 07/02/2023 0845   ALBUMIN 3.4 (L) 07/02/2023 0845   AST 15 07/02/2023 0845   ALT 12 07/02/2023 0845   ALKPHOS 74 07/02/2023 0845   BILITOT 0.4 07/02/2023 0845   GFRNONAA >60 07/02/2023 0845   GFRAA 126 08/26/2007 0943   Lab Results  Component Value Date   WBC 5.8 07/02/2023   NEUTROABS 3.6 07/02/2023   HGB 10.8 (L) 07/02/2023   HCT 34.4 (L) 07/02/2023   MCV 97.7 07/02/2023   PLT 207 07/02/2023     Assessment/Plan Glioblastoma, IDH-wildtype (HCC)  Doris Cheadle Siers is clinically  stable today, now having completed 2 weeks of IMRT and  Temodar.  Labs are WNL.  We ultimately recommended proceeding with course of intensity modulated radiation therapy and concurrent daily Temozolomide.  Radiation will be administered Mon-Fri over 6 weeks, Temodar will be dosed at 75mg /m2 to be given daily over 42 days.  We reviewed side effects of temodar, including fatigue, nausea/vomiting, constipation, and cytopenias.  Chemotherapy should be held for the following:  ANC less than 1,000  Platelets less than 100,000  LFT or creatinine greater than 2x ULN  If clinical concerns/contraindications develop  Every 2 weeks during radiation, labs will be checked accompanied by a clinical evaluation in the brain tumor clinic.  All questions were answered. The patient knows to call the clinic with any problems, questions or concerns. No barriers to learning were detected.  The total time spent in the encounter was 30 minutes and more than 50% was on counseling and review of test results   Henreitta Leber, MD Medical Director of [facility] 07/02/23 9:29 AM

## 2023-07-03 ENCOUNTER — Encounter (HOSPITAL_COMMUNITY): Payer: Self-pay

## 2023-07-03 ENCOUNTER — Ambulatory Visit (HOSPITAL_COMMUNITY): Payer: Medicare Other

## 2023-07-03 ENCOUNTER — Ambulatory Visit
Admission: RE | Admit: 2023-07-03 | Discharge: 2023-07-03 | Disposition: A | Discharge status: Home / Self Care | Payer: Medicare Other | Source: Ambulatory Visit | Attending: Radiation Oncology | Admitting: Radiation Oncology

## 2023-07-03 ENCOUNTER — Other Ambulatory Visit: Payer: Self-pay

## 2023-07-03 ENCOUNTER — Telehealth: Payer: Self-pay | Admitting: *Deleted

## 2023-07-03 DIAGNOSIS — R262 Difficulty in walking, not elsewhere classified: Secondary | ICD-10-CM | POA: Diagnosis not present

## 2023-07-03 DIAGNOSIS — R29898 Other symptoms and signs involving the musculoskeletal system: Secondary | ICD-10-CM | POA: Diagnosis not present

## 2023-07-03 DIAGNOSIS — C711 Malignant neoplasm of frontal lobe: Secondary | ICD-10-CM | POA: Diagnosis not present

## 2023-07-03 DIAGNOSIS — R29818 Other symptoms and signs involving the nervous system: Secondary | ICD-10-CM

## 2023-07-03 DIAGNOSIS — Z510 Encounter for antineoplastic radiation therapy: Secondary | ICD-10-CM | POA: Diagnosis not present

## 2023-07-03 DIAGNOSIS — R278 Other lack of coordination: Secondary | ICD-10-CM | POA: Diagnosis not present

## 2023-07-03 LAB — RAD ONC ARIA SESSION SUMMARY
Course Elapsed Days: 15
Plan Fractions Treated to Date: 11
Plan Prescribed Dose Per Fraction: 2 Gy
Plan Total Fractions Prescribed: 23
Plan Total Prescribed Dose: 46 Gy
Reference Point Dosage Given to Date: 22 Gy
Reference Point Session Dosage Given: 2 Gy
Session Number: 11

## 2023-07-03 NOTE — Telephone Encounter (Signed)
TCT patient in response to MyChart message that both his feet are swollen and he wondered if it might be one of his medications.  Ms. Crumpley stated patient's feet had been mildly swollen for 1-2 weeks. It became worse yesterday evening. Patient had been out yesterday, had appointment at [facility]. His feet were in dependant position more yesterday than usual per wife.  Ms. Harland said his toes are the more swollen. She said his feet are not reddened, patient denies pain and they are not  unusually warm.   Review of meds with Ms.Hascall/patient - only new med in past 2 weeks was Cipro for UTI and he takes last dose tomorrow.  Recommended elevating feet above heart when seated and mild compression socks if available and able to tolerate.  Asked her to contact office on Monday to update. Advised if patient has further concerns or questions after clinic hours or over weekend, can contact [facility] to be connected to AfterHours Nurse Triage.   Recommended that if patient's feet become painful, red or more swollen after trying recommended actions to seek urgent evaluation. Informed Ms. Azam that this msg will be forwarded to Dr. Barbaraann Cao. She verbalized understanding.

## 2023-07-03 NOTE — Therapy (Signed)
OUTPATIENT PHYSICAL THERAPY NEURO Treatment    Patient Name: Alex Church MRN: [id number] DOB:[birth year], 77 y.o., male Today's Date: 07/03/2023   PCP: Wanda Plump, MD REFERRING PROVIDER: Jacquelynn Cree, PA-C  END OF SESSION:  PT End of Session - 07/03/23 1536     Visit Number 6    Number of Visits 8    Date for PT Re-Evaluation 07/08/23    Authorization Type [employer] Medicare    Progress Note Due on Visit 8    PT Start Time 1517    PT Stop Time 1600    PT Time Calculation (min) 43 min    Activity Tolerance Patient tolerated treatment well    Behavior During Therapy WFL for tasks assessed/performed                Past Medical History:  Diagnosis Date   B12 deficiency    monthly shots   Diabetes mellitus    Glaucoma suspect    Hyperlipemia    Hypertension    Past Surgical History:  Procedure Laterality Date   APPLICATION OF CRANIAL NAVIGATION Right 05/12/2023   Procedure: APPLICATION OF CRANIAL NAVIGATION;  Surgeon: Bethann Goo, DO;  Location: [facility] OR;  Service: Neurosurgery;  Laterality: Right;   CRANIOTOMY Right 05/12/2023   Procedure: RIGHT STERIOSTACTIC FRONTAL CRANIOTOMY FOR TUMOR RESECTION;  Surgeon: Bethann Goo, DO;  Location: [facility] OR;  Service: Neurosurgery;  Laterality: Right;   NO PAST SURGERIES     Patient Active Problem List   Diagnosis Date Noted   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   Glioblastoma, 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   Left-sided weakness 05/06/2023   PVC (premature ventricular contraction) 08/27/2022   Glaucoma suspect 04/30/2020   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   Annual physical exam 06/30/2016   PCP NOTES >>>>>>>>>>>>>>>>>>>>> 02/25/2016   DM2 (diabetes mellitus, type 2) (HCC) 07/06/2013   B12 deficiency 07/06/2013   CAROTID BRUIT, RIGHT 10/01/2010   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  Reports he has 20 days of radiation and taken chemo pill at night.     PERTINENT HISTORY: PVC  PAIN:  Are you having pain? No  PRECAUTIONS: Fall  RED FLAGS: Bowel or bladder incontinence: Yes: recently diagnosed with UTI    WEIGHT BEARING RESTRICTIONS: No  FALLS: Has patient fallen in last 6 months? Yes. Number of falls 2 (most recent was in August 2024)  LIVING ENVIRONMENT: Lives with: lives with their spouse Lives in: House/apartment Stairs: Yes: Internal: 12-13 steps; can reach both and External: 5 steps; on right going up Has following equipment at home: Single point cane, Walker - 2 wheeled, and Wheelchair (manual)  PLOF: Independent and Independent with basic ADLs  PATIENT GOALS: Wife wishes for the patient's legs to be strong again  OBJECTIVE:  Note: Objective measures were completed at Evaluation unless otherwise noted.  DIAGNOSTIC FINDINGS:  05/13/23 MRI HEAD WITHOUT AND WITH CONTRAST   TECHNIQUE: Multiplanar, multiecho pulse sequences of the brain and surrounding structures were obtained without and with intravenous contrast.   CONTRAST:  8mL GADAVIST GADOBUTROL 1 MMOL/ML IV SOLN   COMPARISON:  MRI head 05/05/2023.   FINDINGS: Brain: Interval debulking of a right frontal lesion appears larger section cavity with expected postoperative blood products in the resection  cavity as well as overlying extra-axial spaces. Small to moderate volume of pneumocephalus antidependently along the frontal convexities anteriorly. Surrounding T2/FLAIR hyperintensities is mildly improved and mass effect is mildly improved. Leftward midline shift now measures approximately 4 mm anteriorly. No suspicious residual enhancement, although postoperative left limits assessment. Mm continued attention on follow-up.   No evidence of acute infarct or hydrocephalus.   Vascular: Major arterial flow voids are maintained at the skull base.   Skull and upper cervical spine: Normal marrow signal.   Sinuses/Orbits: Clear sinuses.  No acute orbital findings.   Other: No mastoid effusions.   IMPRESSION: Expected postoperative findings after debulking of a large right frontal mass, detailed above.MRI HEAD WITHOUT AND WITH CONTRAST   TECHNIQUE: Multiplanar, multiecho pulse sequences of the brain and surrounding structures were obtained without and with intravenous contrast.   CONTRAST:  8mL GADAVIST GADOBUTROL 1 MMOL/ML IV SOLN   COMPARISON:  MRI head 05/05/2023.   FINDINGS: Brain: Interval debulking of a right frontal lesion appears larger section cavity with expected postoperative blood products in the resection cavity as well as overlying extra-axial spaces. Small to moderate volume of pneumocephalus antidependently along the frontal convexities anteriorly. Surrounding T2/FLAIR hyperintensities is mildly improved and mass effect is mildly improved. Leftward midline shift now measures approximately 4 mm anteriorly. No suspicious residual enhancement, although postoperative left limits assessment. Mm continued attention on follow-up.   No evidence of acute infarct or hydrocephalus.   Vascular: Major arterial flow voids are maintained at the skull base.   Skull and upper cervical spine: Normal marrow signal.   Sinuses/Orbits: Clear sinuses.  No acute orbital findings.    Other: No mastoid effusions.   IMPRESSION: Expected postoperative findings after debulking of a large right frontal mass, detailed above.  05/05/23 MRI HEAD WITHOUT AND WITH CONTRAST   TECHNIQUE: Multiplanar, multiecho pulse sequences of the brain and surrounding structures were obtained without and with intravenous contrast.   CONTRAST:  9mL GADAVIST GADOBUTROL 1 MMOL/ML IV SOLN   COMPARISON:  05/05/2023 CTA head neck   FINDINGS: Brain: There is a large, predominantly peripherally contrast-enhancing mass located within  the right frontal lobe, measuring 6.8 x 4.9 cm. There is a large amount of surrounding hyperintense T2-weighted signal. There is marked mass effect on the right lateral ventricle and leftward midline shift 6 mm. There is early subfalcine herniation of the cingulate gyrus. No acute hemorrhage. The central necrotic component of the mass does not restrict diffusion.   Vascular: Major flow voids are preserved.   Skull and upper cervical spine: Normal calvarium and skull base. Visualized upper cervical spine and soft tissues are normal.   Sinuses/Orbits:No paranasal sinus fluid levels or advanced mucosal thickening. No mastoid or middle ear effusion. Normal orbits.   IMPRESSION: 1. Large, predominantly peripherally contrast-enhancing mass located within the right frontal lobe, measuring 6.8 x 4.9 cm. Large amount of surrounding hyperintense T2-weighted signal. This is most consistent with a high-grade glioma. 2. Marked mass effect on the right lateral ventricle and 6 mm leftward midline shift. Early subfalcine herniation of the cingulate gyrus.  COGNITION: Overall cognitive status: Within functional limits for tasks assessed   SENSATION: WFL  COORDINATION: Heel to shin: intact and WFL on B Alternating foot taps: intact and WFL  MUSCLE TONE: normal  MUSCLE LENGTH: Hamstrings: moderate restriction on B Gastrocsoleus: moderate restriction on  B  POSTURE: Standing: rounded shoulders, forward head, and decreased lumbar lordosis  LOWER EXTREMITY ROM:     Active  Right Eval Left Eval  Hip flexion [facility]  Hip extension [facility]  Hip abduction [facility]  Hip adduction    Hip internal rotation    Hip external rotation    Knee flexion [facility] WFL  Knee extension [facility]  Ankle dorsiflexion [facility] WFL  Ankle plantarflexion [facility] WFL  Ankle inversion    Ankle eversion     (Blank rows = not tested)  LOWER EXTREMITY MMT:    MMT Right Eval Left Eval  Hip flexion 3+ 3+  Hip extension 2+ 2+  Hip abduction 4- 3+  Hip adduction    Hip internal rotation    Hip external rotation    Knee flexion 4+ 4+  Knee extension 4+ 3+  Ankle dorsiflexion 4+ 4+  Ankle plantarflexion 4+ 4+  Ankle inversion    Ankle eversion    (Blank rows = not tested)  BED MOBILITY:  Sit to supine Complete Independence Supine to sit Complete Independence Rolling to Right Complete Independence Rolling to Left Complete Independence  TRANSFERS: Assistive device utilized: Single point cane  Sit to stand: Modified independence Stand to sit: Modified independence  STAIRS: Level of Assistance: Modified independence Stair Negotiation Technique: Alternating Pattern  with Bilateral Rails Number of Stairs: 4  Height of Stairs: 7"  Comments: Little to no unsteadiness noted  GAIT: Gait pattern: decreased hip/knee flexion- Right, decreased hip/knee flexion- Left, and trendelenburg Distance walked: 308 ft Assistive device utilized: Single point cane Level of assistance: Modified independence Comments: done during  FUNCTIONAL TESTS:  5 times sit to stand: 18.30 sec 2 minute walk test: 308 ft Dynamic Gait Index: 15 Tinetti (balance + gait) = 19   DGI 1. Gait level surface (2) Mild Impairment: Walks 20', uses assistive devices, slower speed, mild gait deviations. 2. Change in gait speed (3) Normal: Able to smoothly change walking speed without loss of  balance or gait deviation. Shows a significant difference in walking speeds between normal, fast and slow speeds. 3. Gait with horizontal head turns (1) Moderate Impairment: Performs head turns with moderate change in gait velocity, slows down, staggers but recovers, can continue  to walk. 4. Dellia Nims with vertical head turns  1) Moderate Impairment: Performs head turns with moderate change in gait velocity, slows down, staggers but recovers, can continue to walk. 5. Gait and pivot turn (2) Mild Impairment: Pivot turns safely in > 3 seconds and stops with no loss of balance. 6. Step over obstacle (2) Mild Impairment: Is able to step over box, but must slow down and adjust steps to clear box safely. 7. Step around obstacles (2) Mild Impairment: Is able to step around both cones, but must slow down and adjust steps to clear cones. 8. Stairs (2) Mild Impairment: Alternating feet, must use rail.  TOTAL SCORE: 15 / 24   PATIENT SURVEYS:  LEFS 51/80 = 63.7%  TODAY'S TREATMENT:                                                                                                                              DATE:  07/03/23: Standing: Sit to stand slow control then power up to heel raise no HHA 15x Functional squat 12 reps SLS Lt 4", Rt 2" max of 3 Vector stance 3x 5" with 1 HHA Tandem stance 1x 30" on solid floor; 2x 30" on foam Sidestep GTB 5RT inside // bars minimal HHA Stairs 1 HR 3RT 7in step height Nustep L5 seat 10x 5' UE/LE   06/30/23 Nustep level 4 seat 10;  5 minutes UE/LE Standing: Heelraises on incline x 20 light UE assist Toe raises on decline x20 light UE assist Lunges onto 4" no UE assist 2X10 each 6" forward step ups 20X each LE 6" lateral step ups 20X each LE Vectors 5X 3" each with 1 UE assist 3# hip abduction 2 x 10 3# hip extension 2 x 10 3# marching 2 x 10 Sit to stand 2 x 5 no UE assist  06/25/23 Nustep level 4 seat 12;  5 minutes Standing: Heelraises on incline x  20 light UE assist Toe raises on decline x 10 light UE assist Tandem stance 2 x 30" sec no UE assist  Lunges onto 4" no UE assist 2X10 each Vectors 5X 3" each with 1 UE assist SLS x 20" each occassional UE assist 2# hip abduction 2 x 10 2# hip extension 2 x 10 2# marching 2 x 10 Sit to stand 2 x 5 no UE assist  06/23/23 Standing: Heels raises on incline x 20 light UE assist Toe raises on decline x 10 light UE assist Sit to stand 2 x 5 no UE assist Tandem stance 2 x 30" sec no UE assist SLS x 20" each occassional UE assist 2# hip abduction 2 x 10 2# hip extension 2 x 10 2# marching 2 x 10   06/15/23 Sit to stand x 10 Standing: March x 10  Squat x 10 Tandem stance x 5 with Rt in front x 5 with left in front.   Supine bridge x 10 SLR x 10 Knee to chest  30 x 3"  Side lying  hip abduction x 10 B 06/10/23 Evaluation and patient education    PATIENT EDUCATION: Education details: Educated on the goals and course of rehab. Education on measures to reduce falls at home. Person educated: Patient and Spouse Education method: Explanation Education comprehension: verbal cues required  HOME EXERCISE PROGRAM: 06/15/23 Access Code: [email or web address] URL: [email or web address] Date: 06/15/2023 Prepared by: Virgina Organ  Exercises - Supine Bridge  - 1 x daily - 7 x weekly - 1 sets - 10 reps - 5" hold - Heel Raises with Counter Support  - 1 x daily - 7 x weekly - 1 sets - 10 reps - 5" hold - Standing March with Counter Support  - 1 x daily - 7 x weekly - 1 sets - 10 reps - 5" hold - Mini Squat with Counter Support  - 1 x daily - 7 x weekly - 1 sets - 10 reps - 5" hold - Sit to Stand Without Arm Support  - 1 x daily - 7 x weekly - 1 sets - 10 reps  07/03/23: - Single Leg Stance  - 2 x daily - 7 x weekly - 2 sets - 3 reps - 30" hold - Standing 3-Way Kick  - 2 x daily - 7 x weekly - 1 sets - 5 reps - 5" hold - Side Stepping with Resistance at Thighs  - 1 x daily - 7 x weekly  - 1 sets - 5 reps  GOALS: Goals reviewed with patient? Yes  SHORT TERM GOALS: Target date: 06/24/23  Pt will demonstrate indep in HEP to facilitate carry-over of skilled services and improve functional outcomes Goal status: on-going  LONG TERM GOALS: Target date: 07/08/23  Pt will decrease 5TSTS by at least 3 seconds in order to demonstrate clinically significant improvement in LE strength  Baseline: 18.30 sec Goal status: on-going  2.  Pt will improve DGI by at least 3 points in order to demonstrate clinically significant improvement in balance and decreased risk for falls  Baseline: 15 Goal status: on-going  3.  Patient will demonstrate increase in Tinetti Score by 3 points in order to demonstrate clinically significant improvement in balance and decreased risk for falls Baseline: 19 Goal status: on-going  4.  Pt will increase by at least 40 ft in order to demonstrate clinically significant improvement in community ambulation Baseline: 308 ft Goal status: on-going  5.  Pt will increase LEFS by at least 9 points in order to demonstrate significant improvement in lower extremity function.  Baseline: 51 Goal status: on-going 6.  Pt will demonstrate increase in LE strength to 4+/5 to facilitate ease and safety in ambulation Baseline: 2+/5 Goal status: on-going  ASSESSMENT:  CLINICAL IMPRESSION: Began with nustep for functional warm up and mobility.  Overall doing well with good functional tolerance despite current chemo and radiation treatments daily. Continued with standing exercises with minimal rest breaks or cues.  Increased weight to 3# with standing exercise with good challenge with cues for avoiding trunk substitution. Pt also tends to rush through and reduce eccentric control requiring verbal cues.  Began repeated step ups using 6" step and bil UE's to work on LE strengthening.  PT will continue to benefit from skilled PT to address his functional impairments and  limitations.   OBJECTIVE IMPAIRMENTS: Abnormal gait, decreased activity tolerance, decreased balance, difficulty walking, decreased strength, and impaired flexibility.   ACTIVITY LIMITATIONS: carrying, lifting, bending, standing, squatting, stairs, and transfers  PARTICIPATION  LIMITATIONS: meal prep, cleaning, laundry, driving, shopping, community activity, and yard work  PERSONAL FACTORS: Age are also affecting patient's functional outcome.   REHAB POTENTIAL: Good  CLINICAL DECISION MAKING: Stable/uncomplicated  EVALUATION COMPLEXITY: Low  PLAN:  PT FREQUENCY: 2x/week  PT DURATION: 4 weeks  PLANNED INTERVENTIONS: Therapeutic exercises, Therapeutic activity, Neuromuscular re-education, Balance training, Gait training, Patient/Family education, Self Care, Stair training, and Manual therapy  PLAN FOR NEXT SESSION: Progress LE flexibility, balance, gait and strengthening activities.   Becky Sax, LPTA/CLT; CBIS [phone]  Juel Burrow, PTA 07/03/2023, 4:20 PM  4:20 PM, 07/03/23

## 2023-07-06 ENCOUNTER — Ambulatory Visit
Admission: RE | Admit: 2023-07-06 | Discharge: 2023-07-06 | Disposition: A | Discharge status: Home / Self Care | Payer: Medicare Other | Source: Ambulatory Visit | Attending: Radiation Oncology | Admitting: Radiation Oncology

## 2023-07-06 ENCOUNTER — Other Ambulatory Visit: Payer: Self-pay

## 2023-07-06 DIAGNOSIS — C711 Malignant neoplasm of frontal lobe: Secondary | ICD-10-CM | POA: Diagnosis not present

## 2023-07-06 DIAGNOSIS — Z510 Encounter for antineoplastic radiation therapy: Secondary | ICD-10-CM | POA: Diagnosis not present

## 2023-07-06 LAB — RAD ONC ARIA SESSION SUMMARY
Course Elapsed Days: 18
Plan Fractions Treated to Date: 12
Plan Prescribed Dose Per Fraction: 2 Gy
Plan Total Fractions Prescribed: 23
Plan Total Prescribed Dose: 46 Gy
Reference Point Dosage Given to Date: 24 Gy
Reference Point Session Dosage Given: 2 Gy
Session Number: 12

## 2023-07-07 ENCOUNTER — Other Ambulatory Visit: Payer: Self-pay

## 2023-07-07 ENCOUNTER — Other Ambulatory Visit: Payer: Self-pay | Admitting: Internal Medicine

## 2023-07-07 ENCOUNTER — Ambulatory Visit
Admission: RE | Admit: 2023-07-07 | Discharge: 2023-07-07 | Disposition: A | Discharge status: Home / Self Care | Payer: Medicare Other | Source: Ambulatory Visit | Attending: Radiation Oncology | Admitting: Radiation Oncology

## 2023-07-07 ENCOUNTER — Ambulatory Visit (HOSPITAL_COMMUNITY): Payer: Medicare Other | Admitting: Occupational Therapy

## 2023-07-07 ENCOUNTER — Ambulatory Visit (HOSPITAL_COMMUNITY): Payer: Medicare Other | Admitting: Physical Therapy

## 2023-07-07 ENCOUNTER — Encounter (HOSPITAL_COMMUNITY): Payer: Self-pay | Admitting: Occupational Therapy

## 2023-07-07 DIAGNOSIS — R29898 Other symptoms and signs involving the musculoskeletal system: Secondary | ICD-10-CM | POA: Diagnosis not present

## 2023-07-07 DIAGNOSIS — R262 Difficulty in walking, not elsewhere classified: Secondary | ICD-10-CM | POA: Diagnosis not present

## 2023-07-07 DIAGNOSIS — R278 Other lack of coordination: Secondary | ICD-10-CM

## 2023-07-07 DIAGNOSIS — C711 Malignant neoplasm of frontal lobe: Secondary | ICD-10-CM | POA: Diagnosis not present

## 2023-07-07 DIAGNOSIS — Z510 Encounter for antineoplastic radiation therapy: Secondary | ICD-10-CM | POA: Diagnosis not present

## 2023-07-07 DIAGNOSIS — R29818 Other symptoms and signs involving the nervous system: Secondary | ICD-10-CM | POA: Diagnosis not present

## 2023-07-07 LAB — RAD ONC ARIA SESSION SUMMARY
Course Elapsed Days: 19
Plan Fractions Treated to Date: 13
Plan Prescribed Dose Per Fraction: 2 Gy
Plan Total Fractions Prescribed: 23
Plan Total Prescribed Dose: 46 Gy
Reference Point Dosage Given to Date: 26 Gy
Reference Point Session Dosage Given: 2 Gy
Session Number: 13

## 2023-07-07 NOTE — Therapy (Unsigned)
OUTPATIENT OCCUPATIONAL THERAPY NEURO TREATMENT NOTE  Patient Name: Alex Church MRN: [id number] DOB:[birth year], 77 y.o., male Today's Date: 07/08/2023  PCP: Willow Ora, MD REFERRING PROVIDER: Delle Reining, PA-C  END OF SESSION:  OT End of Session - 07/07/23 1431     Visit Number 4    Number of Visits 7    Date for OT Re-Evaluation 07/31/23    Authorization Type [employer] Medicare    OT Start Time 1351    OT Stop Time 1431    OT Time Calculation (min) 40 min    Activity Tolerance Patient tolerated treatment well    Behavior During Therapy WFL for tasks assessed/performed             Past Medical History:  Diagnosis Date   B12 deficiency    monthly shots   Diabetes mellitus    Glaucoma suspect    Hyperlipemia    Hypertension    Past Surgical History:  Procedure Laterality Date   APPLICATION OF CRANIAL NAVIGATION Right 05/12/2023   Procedure: APPLICATION OF CRANIAL NAVIGATION;  Surgeon: Bethann Goo, DO;  Location: [facility] OR;  Service: Neurosurgery;  Laterality: Right;   CRANIOTOMY Right 05/12/2023   Procedure: RIGHT STERIOSTACTIC FRONTAL CRANIOTOMY FOR TUMOR RESECTION;  Surgeon: Bethann Goo, DO;  Location: [facility] OR;  Service: Neurosurgery;  Laterality: Right;   NO PAST SURGERIES     Patient Active Problem List   Diagnosis Date Noted   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   Glioblastoma, 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   Left-sided weakness 05/06/2023   PVC (premature ventricular contraction) 08/27/2022   Glaucoma suspect 04/30/2020   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   Annual physical exam 06/30/2016   PCP NOTES >>>>>>>>>>>>>>>>>>>>> 02/25/2016   DM2 (diabetes mellitus, type 2) (HCC) 07/06/2013   B12 deficiency 07/06/2013   CAROTID BRUIT, RIGHT 10/01/2010   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   HYPERPLASIA PROSTATE UNS W/O UR OBST & OTH LUTS 08/26/2007   HLD (hyperlipidemia) 10/02/2006   Hypertension 10/02/2006    ONSET DATE: 05/05/23  REFERRING DIAG: Brain  Tumor, S/P Resection  THERAPY DIAG:  Other lack of coordination  Other symptoms and signs involving the nervous system  Rationale for Evaluation and Treatment: Rehabilitation  SUBJECTIVE:   SUBJECTIVE STATEMENT: "I'm just a little tired today" Pt accompanied by: self and significant other  PERTINENT HISTORY: The patient was admitted to [facility] on 05/15/2023 and discharged on 05/23/2023 to Inpatient rehab and spent 18 days A. 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  PRECAUTIONS: Fall  WEIGHT BEARING RESTRICTIONS: No  PAIN:  Are you having pain? No  FALLS: Has patient fallen in last 6 months? Yes. Number of falls "multiple"  LIVING ENVIRONMENT: Lives with: lives with their family Lives in: House/apartment Stairs: Yes: Internal: 13 steps; can reach both and External: 3 steps; none Has following equipment at home: Single point cane, Walker - 2 wheeled, shower chair, bed side commode, and Grab bars  PLOF: Independent  PATIENT GOALS: "I'd like to get back to where I was at"  OBJECTIVE:  Note: Objective measures were completed at Evaluation unless otherwise noted.  HAND DOMINANCE: Right  ADLs: Overall ADLs: Pt reports difficulty with lifting and carrying heavy items, specifically, yard work, cleaning, and cooking.   MOBILITY STATUS: Hx of falls  POSTURE COMMENTS:  rounded shoulders Sitting balance: Moves/returns truncal midpoint 1-2 inches in multiple planes  ACTIVITY TOLERANCE: Activity tolerance: Mildly limited  FUNCTIONAL OUTCOME MEASURES: FOTO: 57.89/100  UPPER EXTREMITY ROM:    All Motions WFL  UPPER EXTREMITY MMT:     MMT Right eval Left eval  Shoulder flexion 4/5 4-/5  Shoulder abduction 5/5 4+/5  Shoulder adduction 5/5 5/5  Shoulder extension 4+/5 4+/5  Shoulder internal rotation 4+/5 4/5  Shoulder external rotation 4+/5 4/5  Elbow flexion 5/5 4+/5  Elbow extension 5/5 4/5   Wrist flexion 5/5 4+/5  Wrist extension 5/5 4+/5  Wrist ulnar deviation 5/5 5/5  Wrist radial deviation 5/5 5/5  Wrist pronation 5/5 5/5  Wrist supination 5/5 5/5  (Blank rows = not tested)  HAND FUNCTION: Grip strength: Right: 68 lbs; Left: 64 lbs, Lateral pinch: Right: 19 lbs, Left: 18 lbs, and 3 point pinch: Right: 15 lbs, Left: 12 lbs  COORDINATION: 9 Hole Peg test: Right: 30.87 sec; Left: 34.80 sec  SENSATION: WFL  EDEMA: No swelling noted  OBSERVATIONS: Mildly slowed movements in the hands   TODAY'S TREATMENT:                                                                                                                              DATE:   07/07/23 -Shoulder Strengthening: 2lb dumbbell,  flexion, abduction, protraction, horizontal abduction, er/IR, x10 -Strengthening: 4lb, hammer curl, bicep curl, x10 -Wrist Strengthening: 3lb dumbbell, extension, flexion, ulnar/radial deviation, supination/pronation, x10 -Building a table: min difficulty following complete instructions, good bilateral manual skills.   06/30/23 -Scapular Strengthening: green band, extension, retraction, rows, x15 -Shoulder Strengthening: green band, flexion, abduction, horizontal abduction, er/IR, x15 -Theraputty: red putty, roll into a ball, flatten into a pancake, roll into log, tripod pinch x10, lateral pinch x10, roll into a ball, squeeze x10 -Wrist Strengthening: 2lb dumbbell, extension, flexion, ulnar/radial deviation, supination/pronation, x10 -Digit ROM: composite flexion, abduction/adduction, finger taps, opposition, x10  06/24/23 -Shoulder Strengthening: 2lb dumbbell,  flexion, abduction, protraction, horizontal abduction, er/IR, x10 -Scapular Strengthening: green band, extension, retraction, rows, x10 -Wrist Strengthening: 2lb dumbbell, extension, flexion, ulnar/radial deviation, supination/pronation, x10 -Gripper: 42#, 5 medium beads each hand, 4 small beads each hand -Tiny peg board:  holding 3-4 at a time and placing them in the board, mod difficulty not using opposite hand to help with manipulating pegs  PATIENT EDUCATION: Education details: wrist strengthening Person educated: Patient Education method: Explanation Education comprehension: verbalized understanding  HOME EXERCISE PROGRAM: 10/16: Shoulder strengthening 10/22: Scapular Strengthening 10/29: Wrist Strengthening    GOALS: Goals reviewed with patient? Yes  SHORT TERM GOALS: Target date: 07/31/23  Pt will be provided comprehensive HEP for BUE strengthening in order to complete function tasks independently.   Goal status: IN PROGRESS  2.  Pt will improve BUE strength in order to complete cooking and cleaning at home independently.   Goal status: IN PROGRESS  3.  Pt will increased BUE grip by 10lbs and pinch by 2lbs in order to maintain grasp during yard work tasks.   Goal status: IN PROGRESS  4.  Pt will increase BUE coordination to 27 seconds or quicker in order to improve effectiveness of manipulating buttons, zippers, and clasps.   Goal status: IN PROGRESS   ASSESSMENT:  CLINICAL IMPRESSION: Pt working on overall strength with 3# dumbbells for shoulders, forearms, and wrist. He fatigued mildly during these exercises requiring 2 short rest breaks. Pt spent the remainder of the session working on building a table. He demonstrated good fine motor skills manipulating the nuts and bolts. Pt however had difficulty following the instructions, requiring heavy min assist for sequencing the written instructions. OT also providing verbal and tactile cuing for positioning and technique throughout session.   PERFORMANCE DEFICITS: in functional skills including ADLs, IADLs, coordination, ROM, strength, Fine motor control, Gross motor control, body mechanics, and UE functional use.    PLAN:  OT FREQUENCY: 1-2x/week  OT DURATION: 6 weeks  PLANNED INTERVENTIONS: self care/ADL training, therapeutic  exercise, therapeutic activity, neuromuscular re-education, manual therapy, passive range of motion, functional mobility training, electrical stimulation, ultrasound, paraffin, moist heat, patient/family education, coping strategies training, and DME and/or AE instructions  RECOMMENDED OTHER SERVICES: N/A  CONSULTED AND AGREED WITH PLAN OF CARE: Patient  PLAN FOR NEXT SESSION: Manual therapy as needed, shoulder strengthening, wrist strengthening, grip and pinch strengthening, stabilization.   Trish Mage, OTR/L [facility] [phone] Kennyth Arnold, OT 07/08/2023, 7:46 PM

## 2023-07-07 NOTE — Progress Notes (Signed)
Disenrolled patient in Transport planner.

## 2023-07-08 ENCOUNTER — Other Ambulatory Visit (HOSPITAL_COMMUNITY): Payer: Self-pay

## 2023-07-08 ENCOUNTER — Ambulatory Visit
Admission: RE | Admit: 2023-07-08 | Discharge: 2023-07-08 | Disposition: A | Discharge status: Home / Self Care | Payer: Medicare Other | Source: Ambulatory Visit | Attending: Radiation Oncology | Admitting: Radiation Oncology

## 2023-07-08 ENCOUNTER — Other Ambulatory Visit: Payer: Self-pay

## 2023-07-08 DIAGNOSIS — C711 Malignant neoplasm of frontal lobe: Secondary | ICD-10-CM | POA: Diagnosis not present

## 2023-07-08 DIAGNOSIS — Z510 Encounter for antineoplastic radiation therapy: Secondary | ICD-10-CM | POA: Diagnosis not present

## 2023-07-08 LAB — RAD ONC ARIA SESSION SUMMARY
Course Elapsed Days: 20
Plan Fractions Treated to Date: 14
Plan Prescribed Dose Per Fraction: 2 Gy
Plan Total Fractions Prescribed: 23
Plan Total Prescribed Dose: 46 Gy
Reference Point Dosage Given to Date: 28 Gy
Reference Point Session Dosage Given: 2 Gy
Session Number: 14

## 2023-07-08 MED ORDER — ATORVASTATIN CALCIUM 10 MG PO TABS
10.0000 mg | ORAL_TABLET | Freq: Every day | ORAL | 1 refills | Status: DC
  Filled 2023-07-08: qty 90, 90d supply, fill #0
  Filled 2023-10-05: qty 90, 90d supply, fill #1

## 2023-07-09 ENCOUNTER — Ambulatory Visit
Admission: RE | Admit: 2023-07-09 | Discharge: 2023-07-09 | Disposition: A | Discharge status: Home / Self Care | Payer: Medicare Other | Source: Ambulatory Visit | Attending: Radiation Oncology | Admitting: Radiation Oncology

## 2023-07-09 ENCOUNTER — Ambulatory Visit (HOSPITAL_COMMUNITY): Payer: Medicare Other

## 2023-07-09 ENCOUNTER — Other Ambulatory Visit: Payer: Self-pay

## 2023-07-09 ENCOUNTER — Encounter (HOSPITAL_COMMUNITY): Payer: Self-pay

## 2023-07-09 DIAGNOSIS — R29818 Other symptoms and signs involving the nervous system: Secondary | ICD-10-CM | POA: Diagnosis not present

## 2023-07-09 DIAGNOSIS — R278 Other lack of coordination: Secondary | ICD-10-CM | POA: Diagnosis not present

## 2023-07-09 DIAGNOSIS — C711 Malignant neoplasm of frontal lobe: Secondary | ICD-10-CM | POA: Diagnosis not present

## 2023-07-09 DIAGNOSIS — R29898 Other symptoms and signs involving the musculoskeletal system: Secondary | ICD-10-CM | POA: Diagnosis not present

## 2023-07-09 DIAGNOSIS — R262 Difficulty in walking, not elsewhere classified: Secondary | ICD-10-CM

## 2023-07-09 DIAGNOSIS — Z510 Encounter for antineoplastic radiation therapy: Secondary | ICD-10-CM | POA: Diagnosis not present

## 2023-07-09 LAB — RAD ONC ARIA SESSION SUMMARY
Course Elapsed Days: 21
Plan Fractions Treated to Date: 15
Plan Prescribed Dose Per Fraction: 2 Gy
Plan Total Fractions Prescribed: 23
Plan Total Prescribed Dose: 46 Gy
Reference Point Dosage Given to Date: 30 Gy
Reference Point Session Dosage Given: 2 Gy
Session Number: 15

## 2023-07-10 ENCOUNTER — Ambulatory Visit
Admission: RE | Admit: 2023-07-10 | Discharge: 2023-07-10 | Disposition: A | Discharge status: Home / Self Care | Payer: Medicare Other | Source: Ambulatory Visit | Attending: Radiation Oncology | Admitting: Radiation Oncology

## 2023-07-10 ENCOUNTER — Other Ambulatory Visit: Payer: Self-pay

## 2023-07-10 DIAGNOSIS — Z510 Encounter for antineoplastic radiation therapy: Secondary | ICD-10-CM | POA: Diagnosis not present

## 2023-07-10 DIAGNOSIS — C711 Malignant neoplasm of frontal lobe: Secondary | ICD-10-CM | POA: Insufficient documentation

## 2023-07-10 LAB — RAD ONC ARIA SESSION SUMMARY
Course Elapsed Days: 22
Plan Fractions Treated to Date: 16
Plan Prescribed Dose Per Fraction: 2 Gy
Plan Total Fractions Prescribed: 23
Plan Total Prescribed Dose: 46 Gy
Reference Point Dosage Given to Date: 32 Gy
Reference Point Session Dosage Given: 2 Gy
Session Number: 16

## 2023-07-11 ENCOUNTER — Observation Stay (HOSPITAL_COMMUNITY): Payer: Medicare Other

## 2023-07-11 ENCOUNTER — Emergency Department (HOSPITAL_COMMUNITY): Payer: Medicare Other

## 2023-07-11 ENCOUNTER — Other Ambulatory Visit: Payer: Self-pay

## 2023-07-11 ENCOUNTER — Encounter (HOSPITAL_COMMUNITY): Payer: Self-pay | Admitting: Internal Medicine

## 2023-07-11 ENCOUNTER — Observation Stay (HOSPITAL_COMMUNITY)
Admission: EM | Admit: 2023-07-11 | Discharge: 2023-07-12 | Disposition: A | Discharge status: Home / Self Care | Payer: Medicare Other | Attending: Family Medicine | Admitting: Family Medicine

## 2023-07-11 DIAGNOSIS — R569 Unspecified convulsions: Secondary | ICD-10-CM | POA: Diagnosis not present

## 2023-07-11 DIAGNOSIS — N400 Enlarged prostate without lower urinary tract symptoms: Secondary | ICD-10-CM | POA: Diagnosis not present

## 2023-07-11 DIAGNOSIS — I700 Atherosclerosis of aorta: Secondary | ICD-10-CM | POA: Diagnosis not present

## 2023-07-11 DIAGNOSIS — R55 Syncope and collapse: Secondary | ICD-10-CM | POA: Diagnosis present

## 2023-07-11 DIAGNOSIS — I214 Non-ST elevation (NSTEMI) myocardial infarction: Secondary | ICD-10-CM | POA: Insufficient documentation

## 2023-07-11 DIAGNOSIS — I10 Essential (primary) hypertension: Secondary | ICD-10-CM | POA: Diagnosis not present

## 2023-07-11 DIAGNOSIS — G9340 Encephalopathy, unspecified: Secondary | ICD-10-CM | POA: Insufficient documentation

## 2023-07-11 DIAGNOSIS — Z794 Long term (current) use of insulin: Secondary | ICD-10-CM | POA: Insufficient documentation

## 2023-07-11 DIAGNOSIS — R404 Transient alteration of awareness: Secondary | ICD-10-CM | POA: Diagnosis not present

## 2023-07-11 DIAGNOSIS — Z7984 Long term (current) use of oral hypoglycemic drugs: Secondary | ICD-10-CM | POA: Insufficient documentation

## 2023-07-11 DIAGNOSIS — E782 Mixed hyperlipidemia: Secondary | ICD-10-CM

## 2023-07-11 DIAGNOSIS — Z79899 Other long term (current) drug therapy: Secondary | ICD-10-CM | POA: Insufficient documentation

## 2023-07-11 DIAGNOSIS — C719 Malignant neoplasm of brain, unspecified: Secondary | ICD-10-CM | POA: Diagnosis present

## 2023-07-11 DIAGNOSIS — R410 Disorientation, unspecified: Secondary | ICD-10-CM | POA: Diagnosis not present

## 2023-07-11 DIAGNOSIS — Z85841 Personal history of malignant neoplasm of brain: Secondary | ICD-10-CM | POA: Insufficient documentation

## 2023-07-11 DIAGNOSIS — R918 Other nonspecific abnormal finding of lung field: Secondary | ICD-10-CM | POA: Diagnosis not present

## 2023-07-11 DIAGNOSIS — E785 Hyperlipidemia, unspecified: Secondary | ICD-10-CM | POA: Diagnosis present

## 2023-07-11 DIAGNOSIS — I5A Non-ischemic myocardial injury (non-traumatic): Secondary | ICD-10-CM | POA: Insufficient documentation

## 2023-07-11 DIAGNOSIS — R4182 Altered mental status, unspecified: Secondary | ICD-10-CM | POA: Diagnosis not present

## 2023-07-11 DIAGNOSIS — G936 Cerebral edema: Secondary | ICD-10-CM | POA: Diagnosis not present

## 2023-07-11 DIAGNOSIS — R9389 Abnormal findings on diagnostic imaging of other specified body structures: Secondary | ICD-10-CM | POA: Diagnosis not present

## 2023-07-11 DIAGNOSIS — E1165 Type 2 diabetes mellitus with hyperglycemia: Secondary | ICD-10-CM | POA: Diagnosis not present

## 2023-07-11 DIAGNOSIS — E119 Type 2 diabetes mellitus without complications: Secondary | ICD-10-CM

## 2023-07-11 DIAGNOSIS — I2510 Atherosclerotic heart disease of native coronary artery without angina pectoris: Secondary | ICD-10-CM | POA: Diagnosis not present

## 2023-07-11 DIAGNOSIS — R456 Violent behavior: Secondary | ICD-10-CM | POA: Diagnosis not present

## 2023-07-11 DIAGNOSIS — J9811 Atelectasis: Secondary | ICD-10-CM | POA: Diagnosis not present

## 2023-07-11 DIAGNOSIS — R000 Tachycardia, unspecified: Secondary | ICD-10-CM | POA: Diagnosis not present

## 2023-07-11 DIAGNOSIS — S0990XA Unspecified injury of head, initial encounter: Secondary | ICD-10-CM | POA: Diagnosis not present

## 2023-07-11 HISTORY — DX: Malignant neoplasm of brain, unspecified: C71.9

## 2023-07-11 LAB — CBC WITH DIFFERENTIAL/PLATELET
Abs Immature Granulocytes: 0.08 10*3/uL — ABNORMAL HIGH (ref 0.00–0.07)
Basophils Absolute: 0.1 10*3/uL (ref 0.0–0.1)
Basophils Relative: 1 %
Eosinophils Absolute: 0.2 10*3/uL (ref 0.0–0.5)
Eosinophils Relative: 2 %
HCT: 36.2 % — ABNORMAL LOW (ref 39.0–52.0)
Hemoglobin: 11.2 g/dL — ABNORMAL LOW (ref 13.0–17.0)
Immature Granulocytes: 1 %
Lymphocytes Relative: 34 %
Lymphs Abs: 3 10*3/uL (ref 0.7–4.0)
MCH: 30.4 pg (ref 26.0–34.0)
MCHC: 30.9 g/dL (ref 30.0–36.0)
MCV: 98.1 fL (ref 80.0–100.0)
Monocytes Absolute: 0.8 10*3/uL (ref 0.1–1.0)
Monocytes Relative: 9 %
Neutro Abs: 4.7 10*3/uL (ref 1.7–7.7)
Neutrophils Relative %: 53 %
Platelets: 201 10*3/uL (ref 150–400)
RBC: 3.69 MIL/uL — ABNORMAL LOW (ref 4.22–5.81)
RDW: 16.5 % — ABNORMAL HIGH (ref 11.5–15.5)
WBC: 8.8 10*3/uL (ref 4.0–10.5)
nRBC: 0 % (ref 0.0–0.2)

## 2023-07-11 LAB — COMPREHENSIVE METABOLIC PANEL
ALT: 11 U/L (ref 0–44)
AST: 24 U/L (ref 15–41)
Albumin: 3.2 g/dL — ABNORMAL LOW (ref 3.5–5.0)
Alkaline Phosphatase: 64 U/L (ref 38–126)
Anion gap: 14 (ref 5–15)
BUN: 14 mg/dL (ref 8–23)
CO2: 21 mmol/L — ABNORMAL LOW (ref 22–32)
Calcium: 9.3 mg/dL (ref 8.9–10.3)
Chloride: 102 mmol/L (ref 98–111)
Creatinine, Ser: 1.18 mg/dL (ref 0.61–1.24)
GFR, Estimated: >60 mL/min (ref >60)
Glucose, Bld: 196 mg/dL — ABNORMAL HIGH (ref 70–99)
Potassium: 3.9 mmol/L (ref 3.5–5.1)
Sodium: 137 mmol/L (ref 135–145)
Total Bilirubin: 0.7 mg/dL (ref 0.3–1.2)
Total Protein: 6.2 g/dL — ABNORMAL LOW (ref 6.5–8.1)

## 2023-07-11 LAB — I-STAT CHEM 8, ED
BUN: 13 mg/dL (ref 8–23)
Calcium, Ion: 1.2 mmol/L (ref 1.15–1.40)
Chloride: 100 mmol/L (ref 98–111)
Creatinine, Ser: 1.1 mg/dL (ref 0.61–1.24)
Glucose, Bld: 193 mg/dL — ABNORMAL HIGH (ref 70–99)
HCT: 35 % — ABNORMAL LOW (ref 39.0–52.0)
Hemoglobin: 11.9 g/dL — ABNORMAL LOW (ref 13.0–17.0)
Potassium: 3.8 mmol/L (ref 3.5–5.1)
Sodium: 138 mmol/L (ref 135–145)
TCO2: 22 mmol/L (ref 22–32)

## 2023-07-11 LAB — MAGNESIUM: Magnesium: 1.1 mg/dL — ABNORMAL LOW (ref 1.7–2.4)

## 2023-07-11 LAB — TROPONIN I (HIGH SENSITIVITY)
Troponin I (High Sensitivity): 15 ng/L (ref <18)
Troponin I (High Sensitivity): 381 ng/L (ref <18)

## 2023-07-11 MED ORDER — ACETAMINOPHEN 325 MG PO TABS: 650.0000 mg | ORAL_TABLET | Freq: Four times a day (QID) | ORAL | Status: DC | PRN

## 2023-07-11 MED ORDER — TAMSULOSIN HCL 0.4 MG PO CAPS: 0.4000 mg | ORAL_CAPSULE | Freq: Every day | ORAL | Status: DC

## 2023-07-11 MED ORDER — ONDANSETRON HCL 4 MG/2ML IJ SOLN
4.0000 mg | Freq: Four times a day (QID) | INTRAMUSCULAR | Status: DC | PRN
  Filled 2023-07-11: qty 2

## 2023-07-11 MED ORDER — CLONIDINE HCL 0.1 MG PO TABS
0.1000 mg | ORAL_TABLET | Freq: Two times a day (BID) | ORAL | Status: DC
  Filled 2023-07-11: qty 1

## 2023-07-11 MED ORDER — MELATONIN 3 MG PO TABS: 3.0000 mg | ORAL_TABLET | Freq: Every evening | ORAL | Status: DC | PRN

## 2023-07-11 MED ORDER — MAGNESIUM SULFATE 4 GM/100ML IV SOLN
4.0000 g | Freq: Once | INTRAVENOUS | Status: AC
  Administered 2023-07-12: 4 g via INTRAVENOUS
  Filled 2023-07-11: qty 100

## 2023-07-11 MED ORDER — LEVETIRACETAM IN NACL 1000 MG/100ML IV SOLN
1000.0000 mg | Freq: Once | INTRAVENOUS | Status: AC
  Administered 2023-07-11: 1000 mg via INTRAVENOUS
  Filled 2023-07-11: qty 100

## 2023-07-11 MED ORDER — ATORVASTATIN CALCIUM 10 MG PO TABS: 10.0000 mg | ORAL_TABLET | Freq: Every day | ORAL | Status: DC

## 2023-07-11 MED ORDER — ACETAMINOPHEN 650 MG RE SUPP: 650.0000 mg | Freq: Four times a day (QID) | RECTAL | Status: DC | PRN

## 2023-07-11 MED ORDER — CARVEDILOL 6.25 MG PO TABS
6.2500 mg | ORAL_TABLET | Freq: Two times a day (BID) | ORAL | Status: DC
  Filled 2023-07-11: qty 1

## 2023-07-11 MED ORDER — ONDANSETRON HCL 4 MG/2ML IJ SOLN
4.0000 mg | Freq: Once | INTRAMUSCULAR | Status: AC
  Administered 2023-07-11: 4 mg via INTRAVENOUS
  Filled 2023-07-11: qty 2

## 2023-07-11 MED ORDER — TEMOZOLOMIDE 20 MG PO CAPS: 140.0000 mg | ORAL_CAPSULE | Freq: Every day | ORAL | Status: DC

## 2023-07-11 MED ORDER — PANTOPRAZOLE SODIUM 40 MG PO TBEC: 40.0000 mg | DELAYED_RELEASE_TABLET | Freq: Two times a day (BID) | ORAL | Status: DC

## 2023-07-11 MED ORDER — IOHEXOL 350 MG/ML SOLN
75.0000 mL | Freq: Once | INTRAVENOUS | Status: AC | PRN
  Administered 2023-07-11: 75 mL via INTRAVENOUS

## 2023-07-11 MED ORDER — INSULIN ASPART 100 UNIT/ML IJ SOLN: 0.0000 [IU] | Freq: Three times a day (TID) | INTRAMUSCULAR | Status: DC

## 2023-07-11 NOTE — ED Triage Notes (Signed)
Pt BIB [facility] from home. EMS initially called out for pt being Unresponsive. On EMS arrival pt was alert but combative and confused. Pt did require bilateral wrist restraints in route.   Pt had brain surgery 2 months ago.

## 2023-07-11 NOTE — Consult Note (Incomplete)
NEURO HOSPITALIST CONSULT NOTE   Requesting physician: Dr. Silverio Lay  Reason for Consult:***  History obtained from:  Patient   Chart  Patient and Chart   ***  HPI:                                                                                                                                          Alex Church is an 77 y.o. male with a PMHx of B12 deficiency, DM, HLD, HTN, craniotomy 2 months ago for debulking of a frontal lobe mass concerning for glioblastoma and now receiving chemotherapy. He presents to ED with questionable syncope. He was found by wife who administered chest compressions for 5-8 min. EMS arrived and was able to arouse the patient but he had confusion and combative behavior. He is improved in the ED, but does not recall the events with EMS well. ED team has concern for seizure given recent neuro history.   Past Medical History:  Diagnosis Date   B12 deficiency    monthly shots   Diabetes mellitus    Glaucoma suspect    Hyperlipemia    Hypertension     Past Surgical History:  Procedure Laterality Date   APPLICATION OF CRANIAL NAVIGATION Right 05/12/2023   Procedure: APPLICATION OF CRANIAL NAVIGATION;  Surgeon: Dawley, Alan Mulder, DO;  Location: [facility] OR;  Service: Neurosurgery;  Laterality: Right;   CRANIOTOMY Right 05/12/2023   Procedure: RIGHT STERIOSTACTIC FRONTAL CRANIOTOMY FOR TUMOR RESECTION;  Surgeon: Bethann Goo, DO;  Location: [facility] OR;  Service: Neurosurgery;  Laterality: Right;   NO PAST SURGERIES      Family History  Problem Relation Age of Onset   Diabetes Maternal Uncle    Heart attack Brother 69   Cancer Mother        intra-abdominal   Cirrhosis Cousin        maternal   Diabetes Cousin        maternal   Stroke Neg Hx    Colon cancer Neg Hx    Prostate cancer Neg Hx             ***  Family History: *** Mother *** Father ***  Social History:  reports that he has never smoked. He has never used smokeless tobacco. He reports  that he does not drink alcohol and does not use drugs.  No Known Allergies  MEDICATIONS:                                                                                                                     {  medication reviewed/display:3041432}   ROS:                                                                                                                                       History obtained from {source of history:310783}  General ROS: negative for - chills, fatigue, fever, night sweats, weight gain or weight loss Psychological ROS: negative for - behavioral disorder, hallucinations, memory difficulties, mood swings or suicidal ideation Ophthalmic ROS: negative for - blurry vision, double vision, eye pain or loss of vision ENT ROS: negative for - epistaxis, nasal discharge, oral lesions, sore throat, tinnitus or vertigo Allergy and Immunology ROS: negative for - hives or itchy/watery eyes Hematological and Lymphatic ROS: negative for - bleeding problems, bruising or swollen lymph nodes Endocrine ROS: negative for - galactorrhea, hair pattern changes, polydipsia/polyuria or temperature intolerance Respiratory ROS: negative for - cough, hemoptysis, shortness of breath or wheezing Cardiovascular ROS: negative for - chest pain, dyspnea on exertion, edema or irregular heartbeat Gastrointestinal ROS: negative for - abdominal pain, diarrhea, hematemesis, nausea/vomiting or stool incontinence Genito-Urinary ROS: negative for - dysuria, hematuria, incontinence or urinary frequency/urgency Musculoskeletal ROS: negative for - joint swelling or muscular weakness Neurological ROS: as noted in HPI Dermatological ROS: negative for rash and skin lesion changes   Blood pressure 119/63, pulse 97, resp. rate (!) 24, height 6' (1.829 m), weight 88.9 kg, SpO2 99%.   General Examination:                                                                                                       Physical  Exam  HEENT-  Normocephalic, no lesions, without obvious abnormality.  Normal external eye and conjunctiva.   Cardiovascular- S1-S2 audible, pulses palpable throughout   Lungs-no rhonchi or wheezing noted, no excessive working breathing.  Saturations within normal limits Abdomen- All 4 quadrants palpated and nontender Extremities- Warm, dry and intact Musculoskeletal-no joint tenderness, deformity or swelling Skin-warm and dry, no hyperpigmentation, vitiligo, or suspicious lesions  Neurological Examination Mental Status: Alert, oriented, thought content appropriate.  Speech fluent without evidence of aphasia.  Able to follow 3 step commands without difficulty. Cranial Nerves: II: Discs flat bilaterally; Visual fields grossly normal,  III,IV, VI: ptosis not present, extra-ocular motions intact bilaterally pupils equal, round, reactive to light and accommodation V,VII: smile symmetric, facial light touch sensation normal bilaterally VIII: hearing normal bilaterally IX,X: uvula rises symmetrically XI: bilateral shoulder shrug XII: midline tongue extension Motor: Right : Upper extremity  5/5    Left:     Upper extremity   5/5  Lower extremity   5/5     Lower extremity   5/5 Tone and bulk:normal tone throughout; no atrophy noted Sensory: Pinprick and light touch intact throughout, bilaterally Deep Tendon Reflexes: 2+ and symmetric throughout Plantars: Right: downgoing   Left: downgoing Cerebellar: normal finger-to-nose, normal rapid alternating movements and normal heel-to-shin test Gait: normal gait and station   Lab Results: Basic Metabolic Panel: Recent Labs  Lab 07/11/23 1951 07/11/23 2005  NA 137 138  K 3.9 3.8  CL 102 100  CO2 21*  --   GLUCOSE 196* 193*  BUN 14 13  CREATININE 1.18 1.10  CALCIUM 9.3  --     CBC: Recent Labs  Lab 07/11/23 1951 07/11/23 2005  WBC 8.8  --   NEUTROABS 4.7  --   HGB 11.2* 11.9*  HCT 36.2* 35.0*  MCV 98.1  --   PLT 201  --      Cardiac Enzymes: No results for input(s): "CKTOTAL", "CKMB", "CKMBINDEX", "TROPONINI" in the last 168 hours.  Lipid Panel: No results for input(s): "CHOL", "TRIG", "HDL", "CHOLHDL", "VLDL", "LDLCALC" in the last 168 hours.  Imaging: CT HEAD WO CONTRAST ( )  Result Date: 07/11/2023 CLINICAL DATA:  Initial evaluation for head trauma.  Recent surgery. EXAM: CT HEAD WITHOUT CONTRAST TECHNIQUE: Contiguous axial images were obtained from the base of the skull through the vertex without intravenous contrast. RADIATION DOSE REDUCTION: This exam was performed according to the departmental dose-optimization program which includes automated exposure control, adjustment of the mA and/or kV according to patient size and/or use of iterative reconstruction technique. COMPARISON:  Prior MRI from 05/13/2023. FINDINGS: Brain: Postoperative changes from prior right frontal craniotomy for debulking of patient's right frontal mass again seen. Residual extra-axial collection subjacent to the craniotomy bone flap measures up to 9 mm in thickness. Hyperdensity lining this collection favored to reflect dural thickening for the most part, although a small amount of residual blood products could be present as well. This collection was present on prior MRI. Residual underlying right frontal mass measures approximately 2.6 x 2.9 x 3.8 cm. Improved edema and mass effect within the adjacent right frontal lobe from prior, with improved mass effect on the right lateral ventricle. Previously seen right to left shift has largely resolved. Basilar cisterns remain patent. No other acute intracranial hemorrhage. No acute large vessel territory infarct. No other mass lesion or mass effect. No other extra-axial fluid collection. Vascular: No abnormal hyperdense vessel. Calcified atherosclerosis present at the skull base. Skull: Scalp soft tissues demonstrate no acute finding. Post craniotomy changes on the right. Sinuses/Orbits: Globes  and orbital soft tissues within normal limits. Paranasal sinuses are largely clear. No mastoid effusion. Other: None. IMPRESSION: 1. Postoperative changes from prior right frontal craniotomy for debulking of patient's right frontal mass. Residual underlying right frontal mass measures approximately 2.6 x 2.9 x 3.8 cm. Improved edema and mass effect within the adjacent right frontal lobe, with improved mass effect on the right lateral ventricle. Previously seen right to left shift has largely resolved. 2. Residual extra-axial collection subjacent to the craniotomy bone flap measures up to 9 mm in thickness. Hyperdensity lining this collection favored to reflect dural thickening for the most part, although a small amount of residual blood products could be present as well. This collection was present on prior MRI. 3. No other acute intracranial abnormality. Electronically Signed   By: Janell Quiet.D.  On: 07/11/2023 21:57   CT Angio Chest PE W and/or Wo Contrast  Result Date: 07/11/2023 CLINICAL DATA:  Pulmonary embolism (PE) suspected, high prob. Unresponsive. EXAM: CT ANGIOGRAPHY CHEST WITH CONTRAST TECHNIQUE: Multidetector CT imaging of the chest was performed using the standard protocol during bolus administration of intravenous contrast. Multiplanar CT image reconstructions and MIPs were obtained to evaluate the vascular anatomy. RADIATION DOSE REDUCTION: This exam was performed according to the departmental dose-optimization program which includes automated exposure control, adjustment of the mA and/or kV according to patient size and/or use of iterative reconstruction technique. CONTRAST:  75mL OMNIPAQUE IOHEXOL 350 MG/ML SOLN COMPARISON:  05/06/2023 FINDINGS: Cardiovascular: No filling defects in the pulmonary arteries to suggest pulmonary emboli. Heart borderline in size. Densely calcified coronary arteries diffusely. Scattered aortic atherosclerosis. No aneurysm. Mediastinum/Nodes: No  mediastinal, hilar, or axillary adenopathy. Trachea and esophagus are unremarkable. Thyroid unremarkable. Lungs/Pleura: Ground-glass airspace opacities in the mid and lower lungs may reflect edema. Bibasilar atelectasis. No effusions. Upper Abdomen: No acute findings. Musculoskeletal: Heart and mediastinal contours are within normal limits. No focal opacities or effusions. No acute bony abnormality. 1 no acute bony abnormality. Review of the MIP images confirms the above findings. IMPRESSION: No evidence of pulmonary embolus. Ground-glass airspace opacities in the mid and lower lungs could reflect edema. Bibasilar atelectasis. Diffuse coronary artery disease. Aortic Atherosclerosis (ICD10-I70.0). Electronically Signed   By: Charlett Nose M.D.   On: 07/11/2023 21:41   DG Chest Port 1 View  Result Date: 07/11/2023 CLINICAL DATA:  Altered mental status. EXAM: PORTABLE CHEST 1 VIEW COMPARISON:  Chest CT with contrast 05/06/2023. FINDINGS: The heart size and mediastinal contours are within normal limits. There is calcification of the transverse aorta. Both lungs are clear apart from scattered linear scarring or atelectasis in the lung bases. Mild chronic elevation right hemidiaphragm. The visualized skeletal structures are intact, with degenerative changes in the spine. IMPRESSION: No evidence of acute chest disease. Aortic atherosclerosis. Electronically Signed   By: Almira Bar M.D.   On: 07/11/2023 20:02     Assessment: - Exam reveals *** - CT head without contrast: Postoperative changes from prior right frontal craniotomy for debulking of patient's right frontal mass. Residual underlying right frontal mass measures approximately 2.6 x 2.9 x 3.8 cm. Improved edema and mass effect within the adjacent right frontal lobe, with improved mass effect on the right lateral ventricle. Previously seen right to left shift has largely resolved. Residual extra-axial collection subjacent to the craniotomy bone flap  measures up to 9 mm in thickness. Hyperdensity lining this collection favored to reflect dural thickening for the most part, although a small amount of residual blood products could be present as well. This collection was present on prior MRI. No other acute intracranial abnormality. - Labs: - Na 138 - Glucose elevated at 193 - BUN and Cr normal _  - Ionized calcium normal - AST and ALT normal. .  - Troponin normal - WBC normal - Overall impression: ***   Recommendations: - STAT EEG (ordered) - Mg level (ordered) - ***   Electronically signed: Dr. Caryl Pina 07/11/2023, 10:17 PM

## 2023-07-11 NOTE — H&P (Signed)
History and Physical      Alex Church ZOX:[id number] DOB: 1946-01-17 DOA: 07/11/2023; DOS: 07/11/2023  PCP: Wanda Plump, MD *** Patient coming from: home ***  I have personally briefly reviewed patient's old medical records in [facility]  Chief Complaint: ***  HPI: Alex Church is a 77 y.o. male with medical history significant for *** who is admitted to [facility] on 07/11/2023 with *** after presenting from home*** to [facility] ED complaining of ***.    ***       ***   ED Course:  Vital signs in the ED were notable for the following: ***  Labs were notable for the following: ***  Per my interpretation, EKG in ED demonstrated the following:  ***  Imaging in the ED, per corresponding formal radiology read, was notable for the following:  ***  EDP has discussed patient's case with on-call neurology, Dr. Otelia Limes, who will formally consult, with preliminary recommendations that include EEG (ordered).  Patient has been Keppra loaded, with additional neurology recommendations pending at this time.   While in the ED, the following were administered: ***  Subsequently, the patient was admitted  ***  ***red    Review of Systems: As per HPI otherwise 10 point review of systems negative.   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 He has never  used smokeless tobacco. He reports that he does not drink alcohol and does not use drugs.   No Known Allergies  Family History  Problem Relation Age of Onset   Diabetes Maternal Uncle    Heart attack Brother 73   Cancer Mother        intra-abdominal   Cirrhosis Cousin        maternal   Diabetes Cousin        maternal   Stroke Neg Hx    Colon cancer Neg Hx    Prostate cancer Neg Hx     Family history reviewed and not pertinent ***   Prior to Admission medications   Medication Sig Start Date End Date Taking? Authorizing Provider  acetaminophen (TYLENOL) 325 MG tablet Take 1-2 tablets (325-650 mg total) by mouth every 4 (four) hours as needed for mild pain. 05/22/23   Setzer, Lynnell Jude, PA-C  allopurinol (ZYLOPRIM) 100 MG tablet Take 1 tablet (100 mg total) by mouth 2 (two) times daily. 06/16/23   Wanda Plump, MD  atorvastatin (LIPITOR) 10 MG tablet Take 1 tablet (10 mg total) by mouth at bedtime. 07/08/23   Wanda Plump, MD  Blood Glucose Monitoring Suppl (ONETOUCH VERIO FLEX SYSTEM) w/Device KIT Check blood sugar once daily 05/25/23   Wanda Plump, MD  carvedilol (COREG) 12.5 MG tablet Take 0.5 tablets (6.25 mg total) by mouth 2 (two) times daily with a meal. Due  for appt 05/2023 05/22/23   Milinda Antis, PA-C  ciprofloxacin (CIPRO) 500 MG tablet Take 1 tablet (500 mg total) by mouth 2 (two) times daily. 06/29/23   Wanda Plump, MD  cloNIDine (CATAPRES) 0.1 MG tablet Take 1 tablet (0.1 mg total) by mouth 2 (two) times daily. 06/16/23   Wanda Plump, MD  cyanocobalamin (VITAMIN B12) 1000 MCG tablet Take 1,000 mcg by mouth every other day.    [provider]  glucose blood test strip Check blood sugars once daily 05/25/23   Wanda Plump, MD  Lancets [facility] ULTRASOFT) lancets Check bloods sugars once daily 05/25/23   Wanda Plump, MD  Magnesium Oxide 400 MG CAPS Take 2 capsules (800 mg total) by mouth 2 (two) times daily. 08/18/18   Wanda Plump, MD  melatonin 5 MG TABS Take 1 tablet  (5 mg total) by mouth at bedtime. 05/15/23   Regalado, Jon Billings A, MD  metFORMIN (GLUCOPHAGE) 1000 MG tablet Take 0.5 tablets (500 mg total) by mouth 2 (two) times daily with a meal. 05/22/23   Setzer, Lynnell Jude, PA-C  ondansetron (ZOFRAN) 8 MG tablet Take 1 tablet (8 mg total) by mouth every 8 (eight) hours as needed for nausea or vomiting. May take 30-60 minutes prior to Temodar administration if nausea/vomiting occurs as needed. 06/09/23   Vaslow, Georgeanna Lea, MD  pantoprazole (PROTONIX) 40 MG tablet Take 1 tablet (40 mg total) by mouth 2 (two) times daily. 05/22/23   Setzer, Lynnell Jude, PA-C  pioglitazone (ACTOS) 30 MG tablet Take 1 tablet (30 mg total) by mouth daily. 05/23/23   Setzer, Lynnell Jude, PA-C  polyethylene glycol (MIRALAX / GLYCOLAX) 17 g packet Take 17 g by mouth daily as needed for mild constipation. 05/15/23   Regalado, Belkys A, MD  sitaGLIPtin (JANUVIA) 100 MG tablet Take 1 tablet (100 mg total) by mouth daily. 05/22/23   Setzer, Lynnell Jude, PA-C  sodium chloride 1 g tablet Take 1 tablet (1 g total) by mouth 3 (three) times daily with meals. 05/22/23   Setzer, Lynnell Jude, PA-C  tamsulosin (FLOMAX) 0.4 MG CAPS capsule Take 1 capsule (0.4 mg total) by mouth daily after supper. 06/23/23   Wanda Plump, MD  temozolomide (TEMODAR) 140 MG capsule Take 1 capsule (140 mg total) by mouth at bedtime. May take on an empty stomach to decrease nausea & vomiting. 06/09/23   Henreitta Leber, MD     Objective    Physical Exam: Vitals:   07/11/23 2012 07/11/23 2015 07/11/23 2030 07/11/23 2100  BP: 124/64 117/61 119/63 110/60  Pulse: (!) 101 97 97 91  Resp: 13 20 (!) 24 19  SpO2: 100% 99% 99% 99%  Weight:      Height:        General: appears to be stated age; alert, oriented Skin: warm, dry, no rash Head:  AT/[place] Mouth:  Oral mucosa membranes appear moist, normal dentition Neck: supple; trachea midline Heart:  RRR; did not appreciate any M/R/G Lungs: CTAB, did not appreciate any wheezes, rales, or  rhonchi Abdomen: + BS; soft, ND, NT Vascular: 2+ pedal pulses b/l; 2+ radial pulses b/l Extremities: no peripheral edema, no muscle wasting Neuro: strength and sensation intact in upper and lower extremities b/l ***   *** Neuro: 5/5 strength of the proximal and distal flexors and extensors of the upper and lower extremities bilaterally; sensation intact in upper and lower extremities b/l; cranial nerves II through XII grossly intact; no pronator drift;  no evidence suggestive of slurred speech, dysarthria, or facial droop; Normal muscle tone. No tremors.  *** Neuro: In the setting of the patient's current mental status and associated inability to follow instructions, unable to perform full neurologic exam at this time.  As such, assessment of strength, sensation, and cranial nerves is limited at this time. Patient noted to spontaneously move all 4 extremities. No tremors.  ***    Labs on Admission: I have personally reviewed following labs and imaging studies  CBC: Recent Labs  Lab 07/11/23 1951 07/11/23 2005  WBC 8.8  --   NEUTROABS 4.7  --   HGB 11.2* 11.9*  HCT 36.2* 35.0*  MCV 98.1  --   PLT 201  --    Basic Metabolic Panel: Recent Labs  Lab 07/11/23 1951 07/11/23 2005  NA 137 138  K 3.9 3.8  CL 102 100  CO2 21*  --   GLUCOSE 196* 193*  BUN 14 13  CREATININE 1.18 1.10  CALCIUM 9.3  --    GFR: Estimated Creatinine Clearance: 61.7 mL/min (by C-G formula based on SCr of 1.1 mg/dL). Liver Function Tests: Recent Labs  Lab 07/11/23 1951  AST 24  ALT 11  ALKPHOS 64  BILITOT 0.7  PROT 6.2*  ALBUMIN 3.2*   No results for input(s): "LIPASE", "AMYLASE" in the last 168 hours. No results for input(s): "AMMONIA" in the last 168 hours. Coagulation Profile: No results for input(s): "INR", "PROTIME" in the last 168 hours. Cardiac Enzymes: No results for input(s): "CKTOTAL", "CKMB", "CKMBINDEX", "TROPONINI" in the last 168 hours. BNP (last 3 results) No results for  input(s): "PROBNP" in the last 8760 hours. HbA1C: No results for input(s): "HGBA1C" in the last 72 hours. CBG: No results for input(s): "GLUCAP" in the last 168 hours. Lipid Profile: No results for input(s): "CHOL", "HDL", "LDLCALC", "TRIG", "CHOLHDL", "LDLDIRECT" in the last 72 hours. Thyroid Function Tests: No results for input(s): "TSH", "T4TOTAL", "FREET4", "T3FREE", "THYROIDAB" in the last 72 hours. Anemia Panel: No results for input(s): "VITAMINB12", "FOLATE", "FERRITIN", "TIBC", "IRON", "RETICCTPCT" in the last 72 hours. Urine analysis:    Component Value Date/Time   COLORURINE YELLOW 06/25/2023 1002   APPEARANCEUR CLEAR 06/25/2023 1002   LABSPEC 1.020 06/25/2023 1002   PHURINE 6.5 06/25/2023 1002   GLUCOSEU NEGATIVE 06/25/2023 1002   HGBUR SMALL (A) 06/25/2023 1002   BILIRUBINUR NEGATIVE 06/25/2023 1002   KETONESUR NEGATIVE 06/25/2023 1002   PROTEINUR 100 (A) 05/18/2023 1757   UROBILINOGEN 0.2 06/25/2023 1002   NITRITE NEGATIVE 06/25/2023 1002   LEUKOCYTESUR MODERATE (A) 06/25/2023 1002    Radiological Exams on Admission: CT HEAD WO CONTRAST ( )  Result Date: 07/11/2023 CLINICAL DATA:  Initial evaluation for head trauma.  Recent surgery. EXAM: CT HEAD WITHOUT CONTRAST TECHNIQUE: Contiguous axial images were obtained from the base of the skull through the vertex without intravenous contrast. RADIATION DOSE REDUCTION: This exam was performed according to the departmental dose-optimization program which includes automated exposure control, adjustment of the mA and/or kV according to patient size and/or use of iterative reconstruction technique. COMPARISON:  Prior MRI from 05/13/2023. FINDINGS: Brain: Postoperative changes from prior right frontal craniotomy for debulking of patient's right frontal mass again seen. Residual extra-axial collection subjacent to the craniotomy bone flap measures up to 9 mm in thickness. Hyperdensity lining this collection favored to reflect dural  thickening for the most part, although a small amount of residual blood products could be present as well. This collection was present on prior MRI. Residual  underlying right frontal mass measures approximately 2.6 x 2.9 x 3.8 cm. Improved edema and mass effect within the adjacent right frontal lobe from prior, with improved mass effect on the right lateral ventricle. Previously seen right to left shift has largely resolved. Basilar cisterns remain patent. No other acute intracranial hemorrhage. No acute large vessel territory infarct. No other mass lesion or mass effect. No other extra-axial fluid collection. Vascular: No abnormal hyperdense vessel. Calcified atherosclerosis present at the skull base. Skull: Scalp soft tissues demonstrate no acute finding. Post craniotomy changes on the right. Sinuses/Orbits: Globes and orbital soft tissues within normal limits. Paranasal sinuses are largely clear. No mastoid effusion. Other: None. IMPRESSION: 1. Postoperative changes from prior right frontal craniotomy for debulking of patient's right frontal mass. Residual underlying right frontal mass measures approximately 2.6 x 2.9 x 3.8 cm. Improved edema and mass effect within the adjacent right frontal lobe, with improved mass effect on the right lateral ventricle. Previously seen right to left shift has largely resolved. 2. Residual extra-axial collection subjacent to the craniotomy bone flap measures up to 9 mm in thickness. Hyperdensity lining this collection favored to reflect dural thickening for the most part, although a small amount of residual blood products could be present as well. This collection was present on prior MRI. 3. No other acute intracranial abnormality. Electronically Signed   By: Rise Mu M.D.   On: 07/11/2023 21:57   CT Angio Chest PE W and/or Wo Contrast  Result Date: 07/11/2023 CLINICAL DATA:  Pulmonary embolism (PE) suspected, high prob. Unresponsive. EXAM: CT ANGIOGRAPHY CHEST  WITH CONTRAST TECHNIQUE: Multidetector CT imaging of the chest was performed using the standard protocol during bolus administration of intravenous contrast. Multiplanar CT image reconstructions and MIPs were obtained to evaluate the vascular anatomy. RADIATION DOSE REDUCTION: This exam was performed according to the departmental dose-optimization program which includes automated exposure control, adjustment of the mA and/or kV according to patient size and/or use of iterative reconstruction technique. CONTRAST:  75mL OMNIPAQUE IOHEXOL 350 MG/ML SOLN COMPARISON:  05/06/2023 FINDINGS: Cardiovascular: No filling defects in the pulmonary arteries to suggest pulmonary emboli. Heart borderline in size. Densely calcified coronary arteries diffusely. Scattered aortic atherosclerosis. No aneurysm. Mediastinum/Nodes: No mediastinal, hilar, or axillary adenopathy. Trachea and esophagus are unremarkable. Thyroid unremarkable. Lungs/Pleura: Ground-glass airspace opacities in the mid and lower lungs may reflect edema. Bibasilar atelectasis. No effusions. Upper Abdomen: No acute findings. Musculoskeletal: Heart and mediastinal contours are within normal limits. No focal opacities or effusions. No acute bony abnormality. 1 no acute bony abnormality. Review of the MIP images confirms the above findings. IMPRESSION: No evidence of pulmonary embolus. Ground-glass airspace opacities in the mid and lower lungs could reflect edema. Bibasilar atelectasis. Diffuse coronary artery disease. Aortic Atherosclerosis (ICD10-I70.0). Electronically Signed   By: Charlett Nose M.D.   On: 07/11/2023 21:41   DG Chest Port 1 View  Result Date: 07/11/2023 CLINICAL DATA:  Altered mental status. EXAM: PORTABLE CHEST 1 VIEW COMPARISON:  Chest CT with contrast 05/06/2023. FINDINGS: The heart size and mediastinal contours are within normal limits. There is calcification of the transverse aorta. Both lungs are clear apart from scattered linear scarring or  atelectasis in the lung bases. Mild chronic elevation right hemidiaphragm. The visualized skeletal structures are intact, with degenerative changes in the spine. IMPRESSION: No evidence of acute chest disease. Aortic atherosclerosis. Electronically Signed   By: Almira Bar M.D.   On: 07/11/2023 20:02      Assessment/Plan   Principal Problem:  Syncope   ***            ***                ***                 ***               ***               ***               ***                ***               ***               ***               ***               ***              ***          ***  DVT prophylaxis: SCD's ***  Code Status: Full code*** Family Communication: none*** Disposition Plan: Per Rounding Team Consults called: none***;  Admission status: ***     I SPENT GREATER THAN 75 *** MINUTES IN CLINICAL CARE TIME/MEDICAL DECISION-MAKING IN COMPLETING THIS ADMISSION.      Chaney Born Xzander Gilham DO [facility]  From 7PM - 7AM   07/11/2023, 10:52 PM   ***

## 2023-07-11 NOTE — ED Provider Notes (Signed)
[facility]GENC[facility] DEPARTMENT AT [facility] Provider Note   CSN: [id number] Arrival date & time: 07/11/23  1931     History  Chief Complaint  Patient presents with   Altered Mental Status   Alex Church is a 77 y.o. male.  Patient brought in by EMS from home after being called for unresponsiveness, combative behavior and confusion requiring bilateral wrist restraints on route.  Upon arrival to ED, patient calm down without need for medications.  He was able to remember what he did throughout his day with his wife but could not recall the events with EMS. He does not recall falling or losing consciousness but cannot state what happened.  Wife provides collaborative information that she heard snoring from the other room and went to check on him and he was on the floor snoring.  She called EMS and was advised this may be respiratory distress and so she did CPR for 5 to 8 minutes before they arrived.  When they arrived, they were able to wake him up although he was combative and confused. Denies pain at this time.  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  Approximately 15 minutes after initial encounter, patient began to have vomiting.   Altered Mental Status Associated symptoms: no abdominal pain and no fever        Home Medications Prior to Admission medications   Medication Sig Start Date End Date Taking? Authorizing Provider  acetaminophen (TYLENOL) 325 MG tablet Take 1-2 tablets (325-650 mg total) by mouth every 4 (four) hours as needed for mild pain. 05/22/23   Setzer, Lynnell Jude, PA-C  allopurinol (ZYLOPRIM) 100 MG tablet Take 1 tablet (100 mg total) by mouth 2 (two) times daily. 06/16/23   Wanda Plump, MD  atorvastatin (LIPITOR) 10 MG tablet Take 1 tablet (10 mg total) by mouth at bedtime. 07/08/23   Wanda Plump, MD  Blood Glucose Monitoring Suppl [facility] VERIO FLEX SYSTEM) w/Device KIT Check blood sugar once daily  05/25/23   Wanda Plump, MD  carvedilol (COREG) 12.5 MG tablet Take 0.5 tablets (6.25 mg total) by mouth 2 (two) times daily with a meal. Due for appt 05/2023 05/22/23   Valetta Fuller, Lynnell Jude, PA-C  ciprofloxacin (CIPRO) 500 MG tablet Take 1 tablet (500 mg total) by mouth 2 (two) times daily. 06/29/23   Wanda Plump, MD  cloNIDine (CATAPRES) 0.1 MG tablet Take 1 tablet (0.1 mg total) by mouth 2 (two) times daily. 06/16/23   Wanda Plump, MD  cyanocobalamin (VITAMIN B12) 1000 MCG tablet Take 1,000 mcg by mouth every other day.    [provider]  glucose blood test strip Check blood sugars once daily 05/25/23   Wanda Plump, MD  Lancets [facility] ULTRASOFT) lancets Check bloods sugars once daily 05/25/23   Wanda Plump, MD  Magnesium Oxide 400 MG CAPS Take 2 capsules (800 mg total) by mouth 2 (two) times daily. 08/18/18   Wanda Plump, MD  melatonin 5 MG TABS Take 1 tablet (5 mg total) by mouth at bedtime. 05/15/23   Regalado, Jon Billings A, MD  metFORMIN (GLUCOPHAGE) 1000 MG tablet Take 0.5 tablets (500 mg total) by mouth 2 (two) times daily with a meal. 05/22/23   Setzer, Lynnell Jude, PA-C  ondansetron (ZOFRAN) 8 MG tablet Take 1 tablet (8 mg total) by mouth every 8 (eight) hours as needed for nausea or vomiting. May take 30-60 minutes  prior to Temodar administration if nausea/vomiting occurs as needed. 06/09/23   Vaslow, Georgeanna Lea, MD  pantoprazole (PROTONIX) 40 MG tablet Take 1 tablet (40 mg total) by mouth 2 (two) times daily. 05/22/23   Setzer, Lynnell Jude, PA-C  pioglitazone (ACTOS) 30 MG tablet Take 1 tablet (30 mg total) by mouth daily. 05/23/23   Setzer, Lynnell Jude, PA-C  polyethylene glycol (MIRALAX / GLYCOLAX) 17 g packet Take 17 g by mouth daily as needed for mild constipation. 05/15/23   Regalado, Belkys A, MD  sitaGLIPtin (JANUVIA) 100 MG tablet Take 1 tablet (100 mg total) by mouth daily. 05/22/23   Setzer, Lynnell Jude, PA-C  sodium chloride 1 g tablet Take 1 tablet (1 g total) by mouth 3 (three) times daily with  meals. 05/22/23   Setzer, Lynnell Jude, PA-C  tamsulosin (FLOMAX) 0.4 MG CAPS capsule Take 1 capsule (0.4 mg total) by mouth daily after supper. 06/23/23   Wanda Plump, MD  temozolomide (TEMODAR) 140 MG capsule Take 1 capsule (140 mg total) by mouth at bedtime. May take on an empty stomach to decrease nausea & vomiting. 06/09/23   Henreitta Leber, MD      Allergies    Patient has no known allergies.    Review of Systems   Review of Systems  Constitutional:  Negative for fever.  Respiratory:  Negative for shortness of breath.   Cardiovascular:  Negative for chest pain.  Gastrointestinal:  Negative for abdominal pain.   Physical Exam Updated Vital Signs BP 110/60   Pulse 91   Resp 19   Ht 6' (1.829 m)   Wt 88.9 kg   SpO2 99%   BMI 26.58 kg/m  General: Well-appearing, NAD, A&O x 4 CV: RRR, no murmurs auscultated Pulm: CTAB, normal WOB Abdomen: Soft, nontender, normoactive bowel sounds Psych: Normal mood and affect  ED Results / Procedures / Treatments   Labs (all labs ordered are listed, but only abnormal results are displayed) Labs Reviewed  CBC WITH DIFFERENTIAL/PLATELET - Abnormal; Notable for the following components:      Result Value   RBC 3.69 (*)    Hemoglobin 11.2 (*)    HCT 36.2 (*)    RDW 16.5 (*)    Abs Immature Granulocytes 0.08 (*)    All other components within normal limits  COMPREHENSIVE METABOLIC PANEL - Abnormal; Notable for the following components:   CO2 21 (*)    Glucose, Bld 196 (*)    Total Protein 6.2 (*)    Albumin 3.2 (*)    All other components within normal limits  I-STAT CHEM 8, ED - Abnormal; Notable for the following components:   Glucose, Bld 193 (*)    Hemoglobin 11.9 (*)    HCT 35.0 (*)    All other components within normal limits  MAGNESIUM  TROPONIN I (HIGH SENSITIVITY)  TROPONIN I (HIGH SENSITIVITY)    EKG None  Radiology CT HEAD WO CONTRAST ( )  Result Date: 07/11/2023 CLINICAL DATA:  Initial evaluation for head  trauma.  Recent surgery. EXAM: CT HEAD WITHOUT CONTRAST TECHNIQUE: Contiguous axial images were obtained from the base of the skull through the vertex without intravenous contrast. RADIATION DOSE REDUCTION: This exam was performed according to the departmental dose-optimization program which includes automated exposure control, adjustment of the mA and/or kV according to patient size and/or use of iterative reconstruction technique. COMPARISON:  Prior MRI from 05/13/2023. FINDINGS: Brain: Postoperative changes from prior right frontal craniotomy for debulking of patient's right frontal  mass again seen. Residual extra-axial collection subjacent to the craniotomy bone flap measures up to 9 mm in thickness. Hyperdensity lining this collection favored to reflect dural thickening for the most part, although a small amount of residual blood products could be present as well. This collection was present on prior MRI. Residual underlying right frontal mass measures approximately 2.6 x 2.9 x 3.8 cm. Improved edema and mass effect within the adjacent right frontal lobe from prior, with improved mass effect on the right lateral ventricle. Previously seen right to left shift has largely resolved. Basilar cisterns remain patent. No other acute intracranial hemorrhage. No acute large vessel territory infarct. No other mass lesion or mass effect. No other extra-axial fluid collection. Vascular: No abnormal hyperdense vessel. Calcified atherosclerosis present at the skull base. Skull: Scalp soft tissues demonstrate no acute finding. Post craniotomy changes on the right. Sinuses/Orbits: Globes and orbital soft tissues within normal limits. Paranasal sinuses are largely clear. No mastoid effusion. Other: None. IMPRESSION: 1. Postoperative changes from prior right frontal craniotomy for debulking of patient's right frontal mass. Residual underlying right frontal mass measures approximately 2.6 x 2.9 x 3.8 cm. Improved edema and mass  effect within the adjacent right frontal lobe, with improved mass effect on the right lateral ventricle. Previously seen right to left shift has largely resolved. 2. Residual extra-axial collection subjacent to the craniotomy bone flap measures up to 9 mm in thickness. Hyperdensity lining this collection favored to reflect dural thickening for the most part, although a small amount of residual blood products could be present as well. This collection was present on prior MRI. 3. No other acute intracranial abnormality. Electronically Signed   By: Rise Mu M.D.   On: 07/11/2023 21:57   CT Angio Chest PE W and/or Wo Contrast  Result Date: 07/11/2023 CLINICAL DATA:  Pulmonary embolism (PE) suspected, high prob. Unresponsive. EXAM: CT ANGIOGRAPHY CHEST WITH CONTRAST TECHNIQUE: Multidetector CT imaging of the chest was performed using the standard protocol during bolus administration of intravenous contrast. Multiplanar CT image reconstructions and MIPs were obtained to evaluate the vascular anatomy. RADIATION DOSE REDUCTION: This exam was performed according to the departmental dose-optimization program which includes automated exposure control, adjustment of the mA and/or kV according to patient size and/or use of iterative reconstruction technique. CONTRAST:  75mL OMNIPAQUE IOHEXOL 350 MG/ML SOLN COMPARISON:  05/06/2023 FINDINGS: Cardiovascular: No filling defects in the pulmonary arteries to suggest pulmonary emboli. Heart borderline in size. Densely calcified coronary arteries diffusely. Scattered aortic atherosclerosis. No aneurysm. Mediastinum/Nodes: No mediastinal, hilar, or axillary adenopathy. Trachea and esophagus are unremarkable. Thyroid unremarkable. Lungs/Pleura: Ground-glass airspace opacities in the mid and lower lungs may reflect edema. Bibasilar atelectasis. No effusions. Upper Abdomen: No acute findings. Musculoskeletal: Heart and mediastinal contours are within normal limits. No focal  opacities or effusions. No acute bony abnormality. 1 no acute bony abnormality. Review of the MIP images confirms the above findings. IMPRESSION: No evidence of pulmonary embolus. Ground-glass airspace opacities in the mid and lower lungs could reflect edema. Bibasilar atelectasis. Diffuse coronary artery disease. Aortic Atherosclerosis (ICD10-I70.0). Electronically Signed   By: Charlett Nose M.D.   On: 07/11/2023 21:41   DG Chest Port 1 View  Result Date: 07/11/2023 CLINICAL DATA:  Altered mental status. EXAM: PORTABLE CHEST 1 VIEW COMPARISON:  Chest CT with contrast 05/06/2023. FINDINGS: The heart size and mediastinal contours are within normal limits. There is calcification of the transverse aorta. Both lungs are clear apart from scattered linear scarring or atelectasis in  the lung bases. Mild chronic elevation right hemidiaphragm. The visualized skeletal structures are intact, with degenerative changes in the spine. IMPRESSION: No evidence of acute chest disease. Aortic atherosclerosis. Electronically Signed   By: Almira Bar M.D.   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  He is currently receiving treatment with temozolomide and radiation.  History and physical exam consistent with syncope versus seizure given altered mental status although now returned to baseline.  CBC, CMP, troponin unremarkable.  I am less concern for  ACS.  Chest x-ray normal.  CTA PE without evidence of PE.  CT head shows postoperative changes with improved mass effect compared to prior scans.  No acute intracranial abnormality.  Discussed with neurology (Dr. Otelia Limes) who advised EEG.  Will admit to hospitalist for syncope versus seizure workup.  Additionally, consider team involve [place] [facility] given patient's history.  Amount and/or Complexity of Data Reviewed Labs: ordered. Radiology: ordered. ECG/medicine tests: ordered.  Risk Prescription drug management.         Final Clinical Impression(s) / ED Diagnoses Final diagnoses:  Transient alteration of awareness    Rx / DC Orders ED Discharge Orders     None         Shelby Mattocks, DO 07/11/23 2241    Charlynne Pander, MD 07/16/23 1558

## 2023-07-12 ENCOUNTER — Observation Stay (HOSPITAL_COMMUNITY): Payer: Medicare Other

## 2023-07-12 ENCOUNTER — Telehealth: Payer: Self-pay | Admitting: Cardiology

## 2023-07-12 ENCOUNTER — Observation Stay (HOSPITAL_BASED_OUTPATIENT_CLINIC_OR_DEPARTMENT_OTHER): Payer: Medicare Other

## 2023-07-12 ENCOUNTER — Other Ambulatory Visit: Payer: Self-pay

## 2023-07-12 DIAGNOSIS — R404 Transient alteration of awareness: Secondary | ICD-10-CM

## 2023-07-12 DIAGNOSIS — I5020 Unspecified systolic (congestive) heart failure: Secondary | ICD-10-CM

## 2023-07-12 DIAGNOSIS — I214 Non-ST elevation (NSTEMI) myocardial infarction: Secondary | ICD-10-CM

## 2023-07-12 DIAGNOSIS — R220 Localized swelling, mass and lump, head: Secondary | ICD-10-CM | POA: Diagnosis not present

## 2023-07-12 DIAGNOSIS — R569 Unspecified convulsions: Secondary | ICD-10-CM | POA: Diagnosis not present

## 2023-07-12 DIAGNOSIS — I5A Non-ischemic myocardial injury (non-traumatic): Secondary | ICD-10-CM | POA: Insufficient documentation

## 2023-07-12 DIAGNOSIS — R55 Syncope and collapse: Secondary | ICD-10-CM | POA: Diagnosis not present

## 2023-07-12 LAB — RAPID URINE DRUG SCREEN, HOSP PERFORMED
Amphetamines: NOT DETECTED
Barbiturates: NOT DETECTED
Benzodiazepines: NOT DETECTED
Cocaine: NOT DETECTED
Opiates: NOT DETECTED
Tetrahydrocannabinol: NOT DETECTED

## 2023-07-12 LAB — VITAMIN B12: Vitamin B-12: 560 pg/mL (ref 180–914)

## 2023-07-12 LAB — URINALYSIS, COMPLETE (UACMP) WITH MICROSCOPIC
Bacteria, UA: NONE SEEN
Bilirubin Urine: NEGATIVE
Glucose, UA: NEGATIVE mg/dL
Hgb urine dipstick: NEGATIVE
Ketones, ur: NEGATIVE mg/dL
Nitrite: NEGATIVE
Protein, ur: NEGATIVE mg/dL
Specific Gravity, Urine: 1.025 (ref 1.005–1.030)
pH: 7 (ref 5.0–8.0)

## 2023-07-12 LAB — ETHANOL: Alcohol, Ethyl (B): <10 mg/dL (ref <10)

## 2023-07-12 LAB — TROPONIN I (HIGH SENSITIVITY)
Troponin I (High Sensitivity): 1296 ng/L (ref <18)
Troponin I (High Sensitivity): 924 ng/L (ref <18)

## 2023-07-12 LAB — ECHOCARDIOGRAM COMPLETE
AR max vel: 1.92 cm2
AV Peak grad: 8.5 mm[Hg]
Ao pk vel: 1.46 m/s
Area-P 1/2: 5.38 cm2
Height: 72 in
MV M vel: 4.04 m/s
MV Peak grad: 65.3 mm[Hg]
S' Lateral: 3.7 cm
Weight: 3136 [oz_av]

## 2023-07-12 LAB — CBC WITH DIFFERENTIAL/PLATELET
Abs Immature Granulocytes: 0.05 10*3/uL (ref 0.00–0.07)
Basophils Absolute: 0.1 10*3/uL (ref 0.0–0.1)
Basophils Relative: 1 %
Eosinophils Absolute: 0.1 10*3/uL (ref 0.0–0.5)
Eosinophils Relative: 1 %
HCT: 33.4 % — ABNORMAL LOW (ref 39.0–52.0)
Hemoglobin: 10.5 g/dL — ABNORMAL LOW (ref 13.0–17.0)
Immature Granulocytes: 1 %
Lymphocytes Relative: 16 %
Lymphs Abs: 1.4 10*3/uL (ref 0.7–4.0)
MCH: 30.4 pg (ref 26.0–34.0)
MCHC: 31.4 g/dL (ref 30.0–36.0)
MCV: 96.8 fL (ref 80.0–100.0)
Monocytes Absolute: 0.8 10*3/uL (ref 0.1–1.0)
Monocytes Relative: 9 %
Neutro Abs: 6.5 10*3/uL (ref 1.7–7.7)
Neutrophils Relative %: 72 %
Platelets: 180 10*3/uL (ref 150–400)
RBC: 3.45 MIL/uL — ABNORMAL LOW (ref 4.22–5.81)
RDW: 16.3 % — ABNORMAL HIGH (ref 11.5–15.5)
WBC: 8.9 10*3/uL (ref 4.0–10.5)
nRBC: 0 % (ref 0.0–0.2)

## 2023-07-12 LAB — COMPREHENSIVE METABOLIC PANEL
ALT: 12 U/L (ref 0–44)
AST: 25 U/L (ref 15–41)
Albumin: 3 g/dL — ABNORMAL LOW (ref 3.5–5.0)
Alkaline Phosphatase: 61 U/L (ref 38–126)
Anion gap: 9 (ref 5–15)
BUN: 9 mg/dL (ref 8–23)
CO2: 25 mmol/L (ref 22–32)
Calcium: 8.9 mg/dL (ref 8.9–10.3)
Chloride: 103 mmol/L (ref 98–111)
Creatinine, Ser: 0.96 mg/dL (ref 0.61–1.24)
GFR, Estimated: >60 mL/min (ref >60)
Glucose, Bld: 157 mg/dL — ABNORMAL HIGH (ref 70–99)
Potassium: 3.3 mmol/L — ABNORMAL LOW (ref 3.5–5.1)
Sodium: 137 mmol/L (ref 135–145)
Total Bilirubin: 0.7 mg/dL (ref 0.3–1.2)
Total Protein: 5.3 g/dL — ABNORMAL LOW (ref 6.5–8.1)

## 2023-07-12 LAB — MAGNESIUM: Magnesium: 2.1 mg/dL (ref 1.7–2.4)

## 2023-07-12 LAB — TSH: TSH: 0.173 u[IU]/mL — ABNORMAL LOW (ref 0.350–4.500)

## 2023-07-12 LAB — GLUCOSE, CAPILLARY: Glucose-Capillary: 146 mg/dL — ABNORMAL HIGH (ref 70–99)

## 2023-07-12 LAB — CK: Total CK: 70 U/L (ref 49–397)

## 2023-07-12 MED ORDER — ASPIRIN 81 MG PO TBEC: 81.0000 mg | DELAYED_RELEASE_TABLET | Freq: Every day | ORAL | Status: DC

## 2023-07-12 MED ORDER — ASPIRIN 81 MG PO CHEW: 324.0000 mg | CHEWABLE_TABLET | Freq: Once | ORAL | Status: DC

## 2023-07-12 MED ORDER — SODIUM CHLORIDE 0.9 % IV SOLN: 2000.0000 mg | Freq: Once | INTRAVENOUS | Status: DC

## 2023-07-12 MED ORDER — ONDANSETRON HCL 4 MG PO TABS: 4.0000 mg | ORAL_TABLET | Freq: Three times a day (TID) | ORAL | Status: DC | PRN

## 2023-07-12 MED ORDER — HEPARIN (PORCINE) 25000 UT/250ML-% IV SOLN
1100.0000 [IU]/h | INTRAVENOUS | Status: DC
  Filled 2023-07-12: qty 250

## 2023-07-12 MED ORDER — ALLOPURINOL 100 MG PO TABS
100.0000 mg | ORAL_TABLET | Freq: Two times a day (BID) | ORAL | Status: DC
  Administered 2023-07-12: 100 mg via ORAL
  Filled 2023-07-12 (×2): qty 1

## 2023-07-12 MED ORDER — NALOXONE HCL 0.4 MG/ML IJ SOLN: 0.4000 mg | INTRAMUSCULAR | Status: DC | PRN

## 2023-07-12 MED ORDER — LEVETIRACETAM 500 MG PO TABS: 500.0000 mg | ORAL_TABLET | Freq: Two times a day (BID) | ORAL | 0 refills | Status: DC

## 2023-07-12 MED ORDER — HYDROCODONE-ACETAMINOPHEN 5-325 MG PO TABS
1.0000 | ORAL_TABLET | Freq: Four times a day (QID) | ORAL | Status: DC | PRN
  Administered 2023-07-12 (×2): 1 via ORAL
  Filled 2023-07-12 (×2): qty 1

## 2023-07-12 MED ORDER — POTASSIUM CHLORIDE CRYS ER 20 MEQ PO TBCR: 40.0000 meq | EXTENDED_RELEASE_TABLET | Freq: Once | ORAL | Status: DC

## 2023-07-12 MED ORDER — GADOBUTROL 1 MMOL/ML IV SOLN
9.0000 mL | Freq: Once | INTRAVENOUS | Status: AC | PRN
Start: 2023-07-12 — End: 2023-07-12
  Administered 2023-07-12: 9 mL via INTRAVENOUS

## 2023-07-12 MED ORDER — HEPARIN BOLUS VIA INFUSION
4000.0000 [IU] | Freq: Once | INTRAVENOUS | Status: DC
  Filled 2023-07-12: qty 4000

## 2023-07-12 MED ORDER — LEVETIRACETAM 500 MG PO TABS
500.0000 mg | ORAL_TABLET | Freq: Two times a day (BID) | ORAL | Status: DC
  Administered 2023-07-12 (×2): 500 mg via ORAL
  Filled 2023-07-12 (×2): qty 1

## 2023-07-12 MED ORDER — LEVETIRACETAM 500 MG PO TABS: 500.0000 mg | ORAL_TABLET | Freq: Two times a day (BID) | ORAL | Status: DC

## 2023-07-12 NOTE — Consult Note (Signed)
Cardiology Consultation   Patient ID: Alex Church MRN: [id number]; DOB: 1945-09-26  Admit date: 07/11/2023 Date of Consult: 07/12/2023  PCP:  Wanda Plump, MD   [facility] Providers Cardiologist:  None   {  Patient Profile:   Alex Church is a 77 y.o. male with a hx of brain CA who is being seen 07/12/2023 for the evaluation of new LV dysfunction at the request of Dr. Maryfrances Bunnell.  History of Present Illness:   Alex Church was diagnosed with brain CA in August after developing neuro changes and underwent craniotomy and resection in September. He has been treated with chemo therapy and is pending XRT and has an appointment with rad onc tomorrow. He presented with altered mentation and seizure like symptoms which resolved after several minutes on the arrival of paramedics. He did not shocked and had spontaneous return of consciousness though he was noted to combative. He was admitted and noted to have a troponin of 1000 and mild/mod LV dysfunction which is newly diagnosed. His ECG shows no evidence of prior MI and he has no angina or sob. He was previously on coreg and lisinopril under the direction of Dr. Drue Novel, his primary MD. No h/o syncope and he has previously been active and has had no anginal symptoms whatsoever.   Past Medical History:  Diagnosis Date   B12 deficiency    monthly shots   Diabetes mellitus    Glaucoma suspect    Glioblastoma (HCC)    Hyperlipemia    Hypertension     Past Surgical History:  Procedure Laterality Date   APPLICATION OF CRANIAL NAVIGATION Right 05/12/2023   Procedure: APPLICATION OF CRANIAL NAVIGATION;  Surgeon: Dawley, Alan Mulder, DO;  Location: [facility] OR;  Service: Neurosurgery;  Laterality: Right;   CRANIOTOMY Right 05/12/2023   Procedure: RIGHT STERIOSTACTIC FRONTAL CRANIOTOMY FOR TUMOR RESECTION;  Surgeon: Bethann Goo, DO;  Location: [facility] OR;  Service: Neurosurgery;  Laterality: Right;     Home Medications:  Prior to Admission medications    Medication Sig Start Date End Date Taking? Authorizing Provider  acetaminophen (TYLENOL) 500 MG tablet Take 1,000 mg by mouth every 6 (six) hours as needed for mild pain (pain score 1-3) (Tries to only take twice a week).   Yes [provider]  allopurinol (ZYLOPRIM) 100 MG tablet Take 1 tablet (100 mg total) by mouth 2 (two) times daily. 06/16/23  Yes Paz, Nolon Rod, MD  atorvastatin (LIPITOR) 10 MG tablet Take 1 tablet (10 mg total) by mouth at bedtime. 07/08/23  Yes Paz, Nolon Rod, MD  Blood Glucose Monitoring Suppl (ONETOUCH VERIO FLEX SYSTEM) w/Device KIT Check blood sugar once daily 05/25/23  Yes Paz, Nolon Rod, MD  carvedilol (COREG) 12.5 MG tablet Take 0.5 tablets (6.25 mg total) by mouth 2 (two) times daily with a meal. Due for appt 05/2023 05/22/23  Yes Setzer, Lynnell Jude, PA-C  cloNIDine (CATAPRES) 0.1 MG tablet Take 1 tablet (0.1 mg total) by mouth 2 (two) times daily. 06/16/23  Yes Paz, Nolon Rod, MD  Cyanocobalamin (VITAMIN B-12) 1000 MCG/15ML LIQD Take 1,000 mcg by mouth daily.   Yes [provider]  glucose blood test strip Check blood sugars once daily 05/25/23  Yes Paz, Nolon Rod, MD  ibuprofen (ADVIL) 200 MG tablet Take 600 mg by mouth every 6 (six) hours as needed for mild pain (pain score 1-3).   Yes [provider]  Lancets Letta Pate ULTRASOFT) lancets Check bloods sugars once daily 05/25/23  Yes Paz, Elita Quick E, MD  melatonin 5 MG TABS Take 1 tablet (5 mg total) by mouth at bedtime. Patient taking differently: Take 20 mg by mouth at bedtime. 05/15/23  Yes Regalado, Belkys A, MD  metFORMIN (GLUCOPHAGE) 1000 MG tablet Take 0.5 tablets (500 mg total) by mouth 2 (two) times daily with a meal. 05/22/23  Yes Setzer, Lynnell Jude, PA-C  pioglitazone (ACTOS) 30 MG tablet Take 1 tablet (30 mg total) by mouth daily. 05/23/23  Yes Setzer, Lynnell Jude, PA-C  polyethylene glycol (MIRALAX / GLYCOLAX) 17 g packet Take 17 g by mouth daily as needed for mild constipation. 05/15/23  Yes Regalado, Belkys A,  MD  sodium chloride 1 g tablet Take 1 tablet (1 g total) by mouth 3 (three) times daily with meals. 05/22/23  Yes Setzer, Lynnell Jude, PA-C  tamsulosin (FLOMAX) 0.4 MG CAPS capsule Take 1 capsule (0.4 mg total) by mouth daily after supper. 06/23/23  Yes Wanda Plump, MD  temozolomide (TEMODAR) 140 MG capsule Take 1 capsule (140 mg total) by mouth at bedtime. May take on an empty stomach to decrease nausea & vomiting. 06/09/23  Yes Vaslow, Georgeanna Lea, MD  ciprofloxacin (CIPRO) 500 MG tablet Take 1 tablet (500 mg total) by mouth 2 (two) times daily. Patient not taking: Reported on 07/11/2023 06/29/23   Wanda Plump, MD  levETIRAcetam (KEPPRA) 500 MG tablet Take 1 tablet (500 mg total) by mouth 2 (two) times daily. 07/12/23   Danford, Earl Lites, MD  lisinopril (ZESTRIL) 20 MG tablet Take 20 mg by mouth at bedtime.    [provider]  ondansetron (ZOFRAN) 8 MG tablet Take 1 tablet (8 mg total) by mouth every 8 (eight) hours as needed for nausea or vomiting. May take 30-60 minutes prior to Temodar administration if nausea/vomiting occurs as needed. Patient taking differently: Take 8 mg by mouth at bedtime. Take every evening before taking chemotherapy. 06/09/23   Vaslow, Georgeanna Lea, MD  sitaGLIPtin (JANUVIA) 100 MG tablet Take 1 tablet (100 mg total) by mouth daily. Patient taking differently: Take 100 mg by mouth daily after supper. 05/22/23   Setzer, Lynnell Jude, PA-C    Inpatient Medications: Scheduled Meds:  allopurinol  100 mg Oral BID   aspirin  324 mg Oral Once   atorvastatin  10 mg Oral QHS   carvedilol  6.25 mg Oral BID WC   cloNIDine  0.1 mg Oral BID   heparin  4,000 Units Intravenous Once   insulin aspart  0-6 Units Subcutaneous TID WC   levETIRAcetam  500 mg Oral BID   potassium chloride  40 mEq Oral Once   tamsulosin  0.4 mg Oral QPC supper   temozolomide  140 mg Oral QHS   Continuous Infusions:  heparin     PRN Meds: acetaminophen **OR** acetaminophen, HYDROcodone-acetaminophen,  melatonin, naLOXone (NARCAN)  injection, ondansetron (ZOFRAN) IV, ondansetron  Allergies:   No Known Allergies  Social History:   Social History   Socioeconomic History   Marital status: Married    Spouse name: Not on file   Number of children: 0   Years of education: Not on file   Highest education level: GED or equivalent  Occupational History   Occupation: retired from [employer], works for a lab driving  Tobacco Use   Smoking status: Never   Smokeless tobacco: Never  Substance and Sexual Activity   Alcohol use: No   Drug use: No   Sexual activity: Not on file  Other Topics Concern  Not on file  Social History Narrative   Married (2nd marriage)   Lives w/ wife     Has a rescue dog   Social Determinants of Health   Financial Resource Strain: Low Risk  (06/09/2023)   Overall Financial Resource Strain (CARDIA)    Difficulty of Paying Living Expenses: Not hard at all  Food Insecurity: No Food Insecurity (06/09/2023)   Hunger Vital Sign    Worried About Running Out of Food in the Last Year: Never true    Ran Out of Food in the Last Year: Never true  Transportation Needs: No Transportation Needs (06/09/2023)   PRAPARE - Administrator, Civil Service (Medical): No    Lack of Transportation (Non-Medical): No  Physical Activity: Unknown (06/09/2023)   Exercise Vital Sign    Days of Exercise per Week: 0 days    Minutes of Exercise per Session: Not on file  Stress: No Stress Concern Present (06/09/2023)   [employer] of Occupational Health - Occupational Stress Questionnaire    Feeling of Stress : Only a little  Social Connections: Moderately Isolated (06/09/2023)   Social Connection and Isolation Panel [NHANES]    Frequency of Communication with Friends and Family: Three times a week    Frequency of Social Gatherings with Friends and Family: Twice a week    Attends Religious Services: Never    Database administrator or Organizations: No    Attends Museum/gallery exhibitions officer: Not on file    Marital Status: Married  Catering manager Violence: Not At Risk (06/02/2023)   Humiliation, Afraid, Rape, and Kick questionnaire    Fear of Current or Ex-Partner: No    Emotionally Abused: No    Physically Abused: No    Sexually Abused: No    Family History:    Family History  Problem Relation Age of Onset   Diabetes Maternal Uncle    Heart attack Brother 60   Cancer Mother        intra-abdominal   Cirrhosis Cousin        maternal   Diabetes Cousin        maternal   Stroke Neg Hx    Colon cancer Neg Hx    Prostate cancer Neg Hx      ROS:  Please see the history of present illness.   All other ROS reviewed and negative.     Physical Exam/Data:   Vitals:   07/12/23 0530 07/12/23 0750 07/12/23 0751 07/12/23 0819  BP: 110/60 130/68  127/79  Pulse: 87 81    Resp: 19 12  18   Temp:   98.6 F (37 C) 97.8 F (36.6 C)  TempSrc:   Oral Oral  SpO2: 92% 94%    Weight:      Height:        Intake/Output Summary (Last 24 hours) at 07/12/2023 1520 Last data filed at 07/12/2023 0308 Gross per 24 hour  Intake 195.43 ml  Output --  Net 195.43 ml      07/11/2023    7:47 PM 07/02/2023    9:05 AM 06/10/2023    2:23 PM  Last 3 Weights  Weight (lbs) 196 lb 194 lb 6.4 oz 186 lb 8 oz  Weight (kg) 88.905 kg 88.179 kg 84.596 kg     Body mass index is 26.58 kg/m.  General:  Well nourished, well developed, in no acute distress HEENT: normal Neck: no JVD Vascular: No carotid bruits; Distal pulses 2+ bilaterally Cardiac:  normal S1, S2; RRR with soft S4. Lungs:  clear to auscultation bilaterally, no wheezing, rhonchi or rales  Abd: soft, nontender, no hepatomegaly  Ext: no edema Musculoskeletal:  No deformities, BUE and BLE strength normal and equal Skin: warm and dry  Neuro:  CNs 2-12 intact, no focal abnormalities noted Psych:  Normal affect   EKG:  The EKG was personally reviewed and demonstrates:  nsr Telemetry:  Telemetry was  personally reviewed and demonstrates:  nsr/st  Relevant CV Studies: 2D echo reviewed  Laboratory Data:  High Sensitivity Troponin:   Recent Labs  Lab 07/11/23 1951 07/11/23 2224 07/12/23 0329  TROPONINIHS 15 381* 1,296*     Chemistry Recent Labs  Lab 07/11/23 1951 07/11/23 2005 07/11/23 2232 07/12/23 0329  NA 137 138  --  137  K 3.9 3.8  --  3.3*  CL 102 100  --  103  CO2 21*  --   --  25  GLUCOSE 196* 193*  --  157*  BUN 14 13  --  9  CREATININE 1.18 1.10  --  0.96  CALCIUM 9.3  --   --  8.9  MG  --   --  1.1* 2.1  GFRNONAA >60  --   --  >60  ANIONGAP 14  --   --  9    Recent Labs  Lab 07/11/23 1951 07/12/23 0329  PROT 6.2* 5.3*  ALBUMIN 3.2* 3.0*  AST 24 25  ALT 11 12  ALKPHOS 64 61  BILITOT 0.7 0.7   Lipids No results for input(s): "CHOL", "TRIG", "HDL", "LABVLDL", "LDLCALC", "CHOLHDL" in the last 168 hours.  Hematology Recent Labs  Lab 07/11/23 1951 07/11/23 2005 07/12/23 0329  WBC 8.8  --  8.9  RBC 3.69*  --  3.45*  HGB 11.2* 11.9* 10.5*  HCT 36.2* 35.0* 33.4*  MCV 98.1  --  96.8  MCH 30.4  --  30.4  MCHC 30.9  --  31.4  RDW 16.5*  --  16.3*  PLT 201  --  180   Thyroid  Recent Labs  Lab 07/12/23 0329  TSH 0.173*    BNPNo results for input(s): "BNP", "PROBNP" in the last 168 hours.  DDimer No results for input(s): "DDIMER" in the last 168 hours.   Radiology/Studies:  MR BRAIN W WO CONTRAST  Result Date: 07/12/2023 CLINICAL DATA:  Seizure EXAM: MRI HEAD WITHOUT AND WITH CONTRAST TECHNIQUE: Multiplanar, multiecho pulse sequences of the brain and surrounding structures were obtained without and with intravenous contrast. CONTRAST:  9mL GADAVIST GADOBUTROL 1 MMOL/ML IV SOLN COMPARISON:  CT head 07/11/2023, MR head 05/13/2023 FINDINGS: Brain: Postsurgical changes reflecting right frontal craniotomy for mass resection are again noted. The resection cavity is contracted since the prior study, with significantly decreased surrounding edema and  mass effect. Previously seen leftward midline shift has resolved. Curvilinear enhancement at the periphery of the resection cavity is increased in conspicuity since the prior study (12-30). There is no masslike enhancement. Extra-axial fluid underlying the craniotomy measuring up to 8 mm in thickness with associated dural thickening and enhancement is noted, presumed postsurgical. There is additional extra-axial fluid more posteriorly overlying the right parietal lobe and falx measuring up to 5 mm in thickness. There is no SWI signal dropout within this collection to suggest acute blood. There is no evidence of acute intracranial hemorrhage, extra-axial fluid collection, or acute infarct. Background parenchymal volume is normal. The ventricles are now normal in size with re-expansion of the right lateral ventricle.  The pituitary and suprasellar region are normal. Vascular: Normal flow voids. Skull and upper cervical spine: Postsurgical changes as above. There is no suspicious marrow signal abnormality. Sinuses/Orbits: The paranasal sinuses are clear. The globes and orbits are unremarkable. Other: The mastoid air cells and middle ear cavities are clear. IMPRESSION: 1. The right frontal resection cavity has decreased in size since 05/13/2023, with significantly improved surrounding edema and resolved leftward midline shift. 2. However, peripheral enhancement around the resection cavity has increased, which may reflect evolving posttreatment change or residual disease. 3. Extra-axial fluid and dural thickening overlying the right cerebral hemisphere, likely postsurgical and without significant mass effect. Electronically Signed   By: Lesia Hausen M.D.   On: 07/12/2023 13:10   ECHOCARDIOGRAM COMPLETE  Result Date: 07/12/2023    ECHOCARDIOGRAM REPORT   Patient Name:   Alex Church Date of Exam: 07/12/2023 Medical Rec #:  [id number]      Height:       72.0 in Accession #:    [id number]     Weight:       196.0 lb Date  of Birth:  April 24, 1946       BSA:          2.112 m Patient Age:    77 years       BP:           100/57 mmHg Patient Gender: M              HR:           83 bpm. Exam Location:  Inpatient Procedure: 2D Echo, Cardiac Doppler and Color Doppler Indications:    Syncope R55  History:        Patient has prior history of Echocardiogram examinations, most                 recent 08/17/2018. Arrythmias:PVC, Signs/Symptoms:Syncope; Risk                 Factors:Hypertension, Diabetes and Dyslipidemia.  Sonographer:    Lucendia Herrlich RCS Referring Phys: [id number] Angie Fava  Sonographer Comments: Image acquisition challenging due to patient body habitus. IMPRESSIONS  1. Apical hypokinesis with overall mild LV dysfunction.  2. Left ventricular ejection fraction, by estimation, is 45 to 50%. The left ventricle has mildly decreased function. The left ventricle demonstrates regional wall motion abnormalities (see scoring diagram/findings for description). Left ventricular diastolic parameters were normal.  3. Right ventricular systolic function is normal. The right ventricular size is normal. There is mildly elevated pulmonary artery systolic pressure.  4. Left atrial size was mildly dilated.  5. The mitral valve is normal in structure. Trivial mitral valve regurgitation. No evidence of mitral stenosis.  6. The aortic valve has an indeterminant number of cusps. Aortic valve regurgitation is not visualized. No aortic stenosis is present.  7. The inferior vena cava is dilated in size with >50% respiratory variability, suggesting right atrial pressure of 8 mmHg. Comparison(s): No prior Echocardiogram. FINDINGS  Left Ventricle: Left ventricular ejection fraction, by estimation, is 45 to 50%. The left ventricle has mildly decreased function. The left ventricle demonstrates regional wall motion abnormalities. The left ventricular internal cavity size was normal in size. There is no left ventricular hypertrophy. Left ventricular  diastolic parameters were normal. Right Ventricle: The right ventricular size is normal. Right ventricular systolic function is normal. There is mildly elevated pulmonary artery systolic pressure. The tricuspid regurgitant velocity is 3.02 m/s, and with an assumed right atrial pressure of  8 mmHg, the estimated right ventricular systolic pressure is 44.5 mmHg. Left Atrium: Left atrial size was mildly dilated. Right Atrium: Right atrial size was normal in size. Pericardium: There is no evidence of pericardial effusion. Mitral Valve: The mitral valve is normal in structure. Mild mitral annular calcification. Trivial mitral valve regurgitation. No evidence of mitral valve stenosis. Tricuspid Valve: The tricuspid valve is normal in structure. Tricuspid valve regurgitation is mild . No evidence of tricuspid stenosis. Aortic Valve: The aortic valve has an indeterminant number of cusps. Aortic valve regurgitation is not visualized. No aortic stenosis is present. Aortic valve peak gradient measures 8.5 mmHg. Pulmonic Valve: The pulmonic valve was not well visualized. Pulmonic valve regurgitation is not visualized. No evidence of pulmonic stenosis. Aorta: The aortic root is normal in size and structure. Venous: The inferior vena cava is dilated in size with greater than 50% respiratory variability, suggesting right atrial pressure of 8 mmHg. IAS/Shunts: No atrial level shunt detected by color flow Doppler. Additional Comments: Apical hypokinesis with overall mild LV dysfunction.  LEFT VENTRICLE PLAX 2D LVIDd:         5.10 cm   Diastology LVIDs:         3.70 cm   LV e' medial:    8.24 cm/s LV PW:         1.10 cm   LV E/e' medial:  10.9 LV IVS:        1.10 cm   LV e' lateral:   10.40 cm/s LVOT diam:     2.00 cm   LV E/e' lateral: 8.6 LV SV:         53 LV SV Index:   25 LVOT Area:     3.14 cm  RIGHT VENTRICLE             IVC RV S prime:     16.60 cm/s  IVC diam: 2.50 cm TAPSE (M-mode): 2.2 cm LEFT ATRIUM             Index         RIGHT ATRIUM           Index LA diam:        4.10 cm 1.94 cm/m   RA Area:     12.60 cm LA Vol (A2C):   73.9 ml 34.98 ml/m  RA Volume:   27.60 ml  13.07 ml/m LA Vol (A4C):   72.0 ml 34.09 ml/m LA Biplane Vol: 75.0 ml 35.51 ml/m  AORTIC VALVE AV Area (Vmax): 1.92 cm AV Vmax:        146.00 cm/s AV Peak Grad:   8.5 mmHg LVOT Vmax:      89.33 cm/s LVOT Vmean:     58.333 cm/s LVOT VTI:       0.168 m  AORTA Ao Root diam: 3.30 cm Ao Asc diam:  3.50 cm MITRAL VALVE               TRICUSPID VALVE MV Area (PHT): 5.38 cm    TR Peak grad:   36.5 mmHg MV Decel Time: 141 msec    TR Vmax:        302.00 cm/s MR Peak grad: 65.3 mmHg MR Vmax:      404.00 cm/s  SHUNTS MV E velocity: 89.70 cm/s  Systemic VTI:  0.17 m MV A velocity: 77.10 cm/s  Systemic Diam: 2.00 cm MV E/A ratio:  1.16 Olga Millers MD Electronically signed by Olga Millers MD Signature Date/Time: 07/12/2023/11:03:32 AM  Final    EEG adult  Result Date: 07/12/2023 Charlsie Quest, MD     07/12/2023  7:50 AM Patient Name: Alex Church MRN: [id number] Epilepsy Attending: Charlsie Quest Referring Physician/Provider: Caryl Pina, MD Date: 07/11/2023 Duration: 23.09 mins Patient history: 77 y.o. male with a PMHx of B12 deficiency, gout, DM, HLD, HTN, craniotomy 2 months ago for debulking of a right frontal lobe mass concerning for glioblastoma and now receiving chemotherapy. He presents to ED after an episode of loss of consciousness with unresponsiveness and spasmodic sonorous respirations, followed by confusion on arousal. EEG to evaluate for seizure. Level of alertness: Awake AEDs during EEG study: LEV Technical aspects: This EEG study was done with scalp electrodes positioned according to the 10-20 International system of electrode placement. Electrical activity was reviewed with band pass filter of 1-70Hz , sensitivity of 7 uV/mm, display speed of 20mm/sec with a 60Hz  notched filter applied as appropriate. EEG data were recorded continuously and  digitally stored.  Video monitoring was available and reviewed as appropriate. Description: The posterior dominant rhythm consists of 9 Hz activity of moderate voltage (25-35 uV) seen predominantly in posterior head regions, symmetric and reactive to eye opening and eye closing. EEG showed continuous 3 to 6 Hz theta-delta slowing in bifrontal region.  Hyperventilation and photic stimulation were not performed.   ABNORMALITY - Continuous slow, generalized IMPRESSION: This study is suggestive of cortical dysfunction arising from bifrontal region, likely secondary to underlying craniotomy. No seizures or epileptiform discharges were seen throughout the recording. Priyanka [name]   CT HEAD WO CONTRAST ( )  Result Date: 07/11/2023 CLINICAL DATA:  Initial evaluation for head trauma.  Recent surgery. EXAM: CT HEAD WITHOUT CONTRAST TECHNIQUE: Contiguous axial images were obtained from the base of the skull through the vertex without intravenous contrast. RADIATION DOSE REDUCTION: This exam was performed according to the departmental dose-optimization program which includes automated exposure control, adjustment of the mA and/or kV according to patient size and/or use of iterative reconstruction technique. COMPARISON:  Prior MRI from 05/13/2023. FINDINGS: Brain: Postoperative changes from prior right frontal craniotomy for debulking of patient's right frontal mass again seen. Residual extra-axial collection subjacent to the craniotomy bone flap measures up to 9 mm in thickness. Hyperdensity lining this collection favored to reflect dural thickening for the most part, although a small amount of residual blood products could be present as well. This collection was present on prior MRI. Residual underlying right frontal mass measures approximately 2.6 x 2.9 x 3.8 cm. Improved edema and mass effect within the adjacent right frontal lobe from prior, with improved mass effect on the right lateral ventricle. Previously seen  right to left shift has largely resolved. Basilar cisterns remain patent. No other acute intracranial hemorrhage. No acute large vessel territory infarct. No other mass lesion or mass effect. No other extra-axial fluid collection. Vascular: No abnormal hyperdense vessel. Calcified atherosclerosis present at the skull base. Skull: Scalp soft tissues demonstrate no acute finding. Post craniotomy changes on the right. Sinuses/Orbits: Globes and orbital soft tissues within normal limits. Paranasal sinuses are largely clear. No mastoid effusion. Other: None. IMPRESSION: 1. Postoperative changes from prior right frontal craniotomy for debulking of patient's right frontal mass. Residual underlying right frontal mass measures approximately 2.6 x 2.9 x 3.8 cm. Improved edema and mass effect within the adjacent right frontal lobe, with improved mass effect on the right lateral ventricle. Previously seen right to left shift has largely resolved. 2. Residual extra-axial collection subjacent to the craniotomy  bone flap measures up to 9 mm in thickness. Hyperdensity lining this collection favored to reflect dural thickening for the most part, although a small amount of residual blood products could be present as well. This collection was present on prior MRI. 3. No other acute intracranial abnormality. Electronically Signed   By: Rise Mu M.D.   On: 07/11/2023 21:57   CT Angio Chest PE W and/or Wo Contrast  Result Date: 07/11/2023 CLINICAL DATA:  Pulmonary embolism (PE) suspected, high prob. Unresponsive. EXAM: CT ANGIOGRAPHY CHEST WITH CONTRAST TECHNIQUE: Multidetector CT imaging of the chest was performed using the standard protocol during bolus administration of intravenous contrast. Multiplanar CT image reconstructions and MIPs were obtained to evaluate the vascular anatomy. RADIATION DOSE REDUCTION: This exam was performed according to the departmental dose-optimization program which includes automated  exposure control, adjustment of the mA and/or kV according to patient size and/or use of iterative reconstruction technique. CONTRAST:  75mL OMNIPAQUE IOHEXOL 350 MG/ML SOLN COMPARISON:  05/06/2023 FINDINGS: Cardiovascular: No filling defects in the pulmonary arteries to suggest pulmonary emboli. Heart borderline in size. Densely calcified coronary arteries diffusely. Scattered aortic atherosclerosis. No aneurysm. Mediastinum/Nodes: No mediastinal, hilar, or axillary adenopathy. Trachea and esophagus are unremarkable. Thyroid unremarkable. Lungs/Pleura: Ground-glass airspace opacities in the mid and lower lungs may reflect edema. Bibasilar atelectasis. No effusions. Upper Abdomen: No acute findings. Musculoskeletal: Heart and mediastinal contours are within normal limits. No focal opacities or effusions. No acute bony abnormality. 1 no acute bony abnormality. Review of the MIP images confirms the above findings. IMPRESSION: No evidence of pulmonary embolus. Ground-glass airspace opacities in the mid and lower lungs could reflect edema. Bibasilar atelectasis. Diffuse coronary artery disease. Aortic Atherosclerosis (ICD10-I70.0). Electronically Signed   By: Charlett Nose M.D.   On: 07/11/2023 21:41   DG Chest Port 1 View  Result Date: 07/11/2023 CLINICAL DATA:  Altered mental status. EXAM: PORTABLE CHEST 1 VIEW COMPARISON:  Chest CT with contrast 05/06/2023. FINDINGS: The heart size and mediastinal contours are within normal limits. There is calcification of the transverse aorta. Both lungs are clear apart from scattered linear scarring or atelectasis in the lung bases. Mild chronic elevation right hemidiaphragm. The visualized skeletal structures are intact, with degenerative changes in the spine. IMPRESSION: No evidence of acute chest disease. Aortic atherosclerosis. Electronically Signed   By: Almira Bar M.D.   On: 07/11/2023 20:02     Assessment and Plan:   New LV dysfunction with mildly elevated  troponin - He is asymptomatic. He is on GDMT. He can be discharged on his home coreg and lisinopril. No additional work up except an outpatient 7 day zio and followup with me. No indication for a diuretic. With his brain CA, I am reluctant to prescribe ASA or plavix.  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  Seizure - almost certainly this is due to the recent craniotomy and glioblastoma.  Disp - he can be discharged home this afternoon. He is asymptomatic and has an important appointment tomorrow. He should not drive.   [facility] will sign off.   Medication Recommendations:  see above Other recommendations (labs, testing, etc):  7 day zio obtained from our [place] office Follow up as an outpatient:  Dr. Leonia Reeves in 4 weeks, ok to overbook.   For questions or updates, please contact [facility] Please consult [email or web address] for contact info under    Signed, Lewayne Bunting, MD  07/12/2023 3:20 PM

## 2023-07-12 NOTE — Assessment & Plan Note (Signed)
Patient noted incidentally to have troponin 200 > 1000.  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 - Give aspirin - Start heparin gtt - Consult Cardiology - Continue statin, BB

## 2023-07-12 NOTE — Discharge Instructions (Signed)
Seizure precautions: Per [facility] statutes, patients with seizures are not allowed to drive until they have been seizure-free for six months and cleared by a physician    Use caution when using heavy equipment or power tools. Avoid working on ladders or at heights. Take showers instead of baths. Ensure the water temperature is not too high on the home water heater. Do not go swimming alone. Do not lock yourself in a room alone (i.e. bathroom). When caring for infants or small children, sit down when holding, feeding, or changing them to minimize risk of injury to the child in the event you have a seizure. Maintain good sleep hygiene. Avoid alcohol.    Instructions for family if you have a seizure in the future -----     During the Seizure   - First, ensure adequate ventilation and place the person seizing on the floor on their left side  Loosen clothing around the neck and ensure the airway is patent. If the person seizing is clenching the teeth, do not force the mouth open with any object as this can cause severe damage - Remove all items from the surrounding that can be hazardous. The patient having a seizure may be oblivious to what's happening and may not even know what he or she is doing. If the patient is confused and wandering, either gently guide him/her away and block access to outside areas - Provide reassurance and be comforting - Call 911. In most cases, the seizure ends before EMS arrives. However, there are cases when seizures may last over 3 to 5 minutes. Or the individual may have developed breathing difficulties or severe injuries. If a pregnant patient or a person with diabetes develops a seizure, it is prudent to call an ambulance. - Most patients will remain confused for about 45 to 90 minutes after a seizure, so you must use judgment in calling for help. - Avoid restraints but make sure the patient is in a bed with padded side rails - Place the individual in a lateral  position with the neck slightly flexed; this will help the saliva drain from the mouth and prevent the tongue from falling backward - Remove all nearby furniture and other hazards from the area - Provide the patient with privacy if possible - Call for help and start treatment as ordered by the caregiver  Call 911 and bring them to the ED if: A.  The seizure lasts longer than 5 minutes.      B.  If you don't wake shortly after the seizure or has new problems such as difficulty seeing, speaking or moving following the seizure C.  If you were injured during the seizure D.  If you have a temperature over 102 F (39C) E.  If you vomited during the seizure and now is having trouble breathing     After the Seizure (Postictal Stage)   After a seizure, most patients experience confusion, fatigue, muscle pain and/or a headache. Thus, one should permit the individual to sleep. For the next few days, reassurance is essential. Being calm and helping reorient the person is also of importance.   Most seizures are painless and end spontaneously. Seizures are not harmful to others but can lead to complications such as stress on the lungs, brain and the heart. Individuals with prior lung problems may develop labored breathing and respiratory distress.

## 2023-07-12 NOTE — Assessment & Plan Note (Addendum)
-   Continue Temodar

## 2023-07-12 NOTE — ED Notes (Signed)
ED TO INPATIENT HANDOFF REPORT  ED Nurse Name and Phone #: Victorino Dike [phone]  S Name/Age/Gender Alex Church 77 y.o. male Room/Bed: 002C/002C  Code Status   Code Status: Full Code  Home/SNF/Other Home Patient oriented to: self Is this baseline? No   Triage Complete: Triage complete  Chief Complaint Syncope [R55]  Triage Note Pt BIB [facility] from home. EMS initially called out for pt being Unresponsive. On EMS arrival pt was alert but combative and confused. Pt did require bilateral wrist restraints in route.   Pt had brain surgery 2 months ago.    Allergies No Known Allergies  Level of Care/Admitting Diagnosis ED Disposition     ED Disposition  Admit   Condition  --   Comment  Hospital Area: [facility] [[id number]]  Level of Care: Progressive [102]  Admit to Progressive based on following criteria: MULTISYSTEM THREATS such as stable sepsis, metabolic/electrolyte imbalance with or without encephalopathy that is responding to early treatment.  May place patient in observation at [facility] or [facility] if equivalent level of care is available:: No  Covid Evaluation: Asymptomatic - no recent exposure (last 10 days) testing not required  Diagnosis: Syncope [206001]  Admitting Physician: Angie Fava [[id number]]  Attending Physician: Angie Fava [[id number]]          B Medical/Surgery History Past Medical History:  Diagnosis Date   B12 deficiency    monthly shots   Diabetes mellitus    Glaucoma suspect    Glioblastoma (HCC)    Hyperlipemia    Hypertension    Past Surgical History:  Procedure Laterality Date   APPLICATION OF CRANIAL NAVIGATION Right 05/12/2023   Procedure: APPLICATION OF CRANIAL NAVIGATION;  Surgeon: Bethann Goo, DO;  Location: [facility] OR;  Service: Neurosurgery;  Laterality: Right;   CRANIOTOMY Right 05/12/2023   Procedure: RIGHT STERIOSTACTIC FRONTAL CRANIOTOMY FOR TUMOR RESECTION;  Surgeon: Bethann Goo, DO;  Location: [facility] OR;  Service: Neurosurgery;  Laterality: Right;     A IV Location/Drains/Wounds Patient Lines/Drains/Airways Status     Active Line/Drains/Airways     Name Placement date Placement time Site Days   Peripheral IV 07/11/23 20 G Anterior;Distal;Left;Upper Arm 07/11/23  --  Arm  1            Intake/Output Last 24 hours  Intake/Output Summary (Last 24 hours) at 07/12/2023 0723 Last data filed at 07/12/2023 0308 Gross per 24 hour  Intake 195.43 ml  Output --  Net 195.43 ml    Labs/Imaging Results for orders placed or performed during the hospital encounter of 07/11/23 (from the past 48 hour(s))  CBC with Differential     Status: Abnormal   Collection Time: 07/11/23  7:51 PM  Result Value Ref Range   WBC 8.8 4.0 - 10.5 K/uL   RBC 3.69 (L) 4.22 - 5.81 MIL/uL   Hemoglobin 11.2 (L) 13.0 - 17.0 g/dL   HCT 50.0 (L) 93.8 - 18.2 %   MCV 98.1 80.0 - 100.0 fL   MCH 30.4 26.0 - 34.0 pg   MCHC 30.9 30.0 - 36.0 g/dL   RDW 99.3 (H) 71.6 - 96.7 %   Platelets 201 150 - 400 K/uL   nRBC 0.0 0.0 - 0.2 %   Neutrophils Relative % 53 %   Neutro Abs 4.7 1.7 - 7.7 K/uL   Lymphocytes Relative 34 %   Lymphs Abs 3.0 0.7 - 4.0 K/uL   Monocytes Relative 9 %   Monocytes  Absolute 0.8 0.1 - 1.0 K/uL   Eosinophils Relative 2 %   Eosinophils Absolute 0.2 0.0 - 0.5 K/uL   Basophils Relative 1 %   Basophils Absolute 0.1 0.0 - 0.1 K/uL   Immature Granulocytes 1 %   Abs Immature Granulocytes 0.08 (H) 0.00 - 0.07 K/uL    Comment: Performed at [facility], [address], [place], [place] [address]  Comprehensive metabolic panel     Status: Abnormal   Collection Time: 07/11/23  7:51 PM  Result Value Ref Range   Sodium 137 135 - 145 mmol/L   Potassium 3.9 3.5 - 5.1 mmol/L   Chloride 102 98 - 111 mmol/L   CO2 21 (L) 22 - 32 mmol/L   Glucose, Bld 196 (H) 70 - 99 mg/dL    Comment: Glucose reference range applies only to samples taken after fasting for at least 8 hours.   BUN  14 8 - 23 mg/dL   Creatinine, Ser 7.82 0.61 - 1.24 mg/dL   Calcium 9.3 8.9 - 95.6 mg/dL   Total Protein 6.2 (L) 6.5 - 8.1 g/dL   Albumin 3.2 (L) 3.5 - 5.0 g/dL   AST 24 15 - 41 U/L   ALT 11 0 - 44 U/L   Alkaline Phosphatase 64 38 - 126 U/L   Total Bilirubin 0.7 0.3 - 1.2 mg/dL   GFR, Estimated >21 >30 mL/min    Comment: (NOTE) Calculated using the CKD-EPI Creatinine Equation (2021)    Anion gap 14 5 - 15    Comment: Performed at [facility], [address], [place], [place] [address]  Troponin I (High Sensitivity)     Status: None   Collection Time: 07/11/23  7:51 PM  Result Value Ref Range   Troponin I (High Sensitivity) 15 <18 ng/L    Comment: (NOTE) Elevated high sensitivity troponin I (hsTnI) values and significant  changes across serial measurements may suggest ACS but many other  chronic and acute conditions are known to elevate hsTnI results.  Refer to the "Links" section for chest pain algorithms and additional  guidance. Performed at [facility], [address] [place], [place] [address]   I-stat chem 8, ED (not at [facility], [facility] or [facility])     Status: Abnormal   Collection Time: 07/11/23  8:05 PM  Result Value Ref Range   Sodium 138 135 - 145 mmol/L   Potassium 3.8 3.5 - 5.1 mmol/L   Chloride 100 98 - 111 mmol/L   BUN 13 8 - 23 mg/dL   Creatinine, Ser 9.52 0.61 - 1.24 mg/dL   Glucose, Bld 841 (H) 70 - 99 mg/dL    Comment: Glucose reference range applies only to samples taken after fasting for at least 8 hours.   Calcium, Ion 1.20 1.15 - 1.40 mmol/L   TCO2 22 22 - 32 mmol/L   Hemoglobin 11.9 (L) 13.0 - 17.0 g/dL   HCT 32.4 (L) 40.1 - 02.7 %  Troponin I (High Sensitivity)     Status: Abnormal   Collection Time: 07/11/23 10:24 PM  Result Value Ref Range   Troponin I (High Sensitivity) 381 (HH) <18 ng/L    Comment: CRITICAL RESULT CALLED TO, READ BACK BY AND VERIFIED WITH Karie Schwalbe YOUNG RN [phone] M. ALAMANO (NOTE) Elevated high sensitivity troponin I  (hsTnI) values and significant  changes across serial measurements may suggest ACS but many other  chronic and acute conditions are known to elevate hsTnI results.  Refer  to the "Links" section for chest pain algorithms and additional  guidance. Performed at [facility], [address], [place], [place] [address]   Magnesium     Status: Abnormal   Collection Time: 07/11/23 10:32 PM  Result Value Ref Range   Magnesium 1.1 (L) 1.7 - 2.4 mg/dL    Comment: Performed at [facility], [address] [place], [place] [address]  Urinalysis, Complete w Microscopic -Urine, Clean Catch     Status: Abnormal   Collection Time: 07/12/23  1:12 AM  Result Value Ref Range   Color, Urine YELLOW YELLOW   APPearance CLEAR CLEAR   Specific Gravity, Urine 1.025 1.005 - 1.030   pH 7.0 5.0 - 8.0   Glucose, UA NEGATIVE NEGATIVE mg/dL   Hgb urine dipstick NEGATIVE NEGATIVE   Bilirubin Urine NEGATIVE NEGATIVE   Ketones, ur NEGATIVE NEGATIVE mg/dL   Protein, ur NEGATIVE NEGATIVE mg/dL   Nitrite NEGATIVE NEGATIVE   Leukocytes,Ua TRACE (A) NEGATIVE   RBC / HPF 0-5 0 - 5 RBC/hpf   WBC, UA 0-5 0 - 5 WBC/hpf   Bacteria, UA NONE SEEN NONE SEEN   Squamous Epithelial / HPF 0-5 0 - 5 /HPF    Comment: Performed at [facility], [address] [place], [place] [address]  Rapid urine drug screen (hospital performed)     Status: None   Collection Time: 07/12/23  1:12 AM  Result Value Ref Range   Opiates NONE DETECTED NONE DETECTED   Cocaine NONE DETECTED NONE DETECTED   Benzodiazepines NONE DETECTED NONE DETECTED   Amphetamines NONE DETECTED NONE DETECTED   Tetrahydrocannabinol NONE DETECTED NONE DETECTED   Barbiturates NONE DETECTED NONE DETECTED    Comment: (NOTE) DRUG SCREEN FOR MEDICAL PURPOSES ONLY.  IF CONFIRMATION IS NEEDED FOR ANY PURPOSE, NOTIFY LAB WITHIN 5 DAYS.  LOWEST DETECTABLE LIMITS FOR URINE DRUG SCREEN Drug Class                     Cutoff (ng/mL) Amphetamine and  metabolites    1000 Barbiturate and metabolites    200 Benzodiazepine                 200 Opiates and metabolites        300 Cocaine and metabolites        300 THC                            50 Performed at [facility], [address] [place], [place] [address]   CBC with Differential/Platelet     Status: Abnormal   Collection Time: 07/12/23  3:29 AM  Result Value Ref Range   WBC 8.9 4.0 - 10.5 K/uL   RBC 3.45 (L) 4.22 - 5.81 MIL/uL   Hemoglobin 10.5 (L) 13.0 - 17.0 g/dL   HCT 84.6 (L) 96.2 - 95.2 %   MCV 96.8 80.0 - 100.0 fL   MCH 30.4 26.0 - 34.0 pg   MCHC 31.4 30.0 - 36.0 g/dL   RDW 84.1 (H) 32.4 - 40.1 %   Platelets 180 150 - 400 K/uL   nRBC 0.0 0.0 - 0.2 %   Neutrophils Relative % 72 %   Neutro Abs 6.5 1.7 - 7.7 K/uL   Lymphocytes Relative 16 %   Lymphs Abs 1.4 0.7 - 4.0 K/uL   Monocytes Relative 9 %   Monocytes Absolute 0.8 0.1 - 1.0  K/uL   Eosinophils Relative 1 %   Eosinophils Absolute 0.1 0.0 - 0.5 K/uL   Basophils Relative 1 %   Basophils Absolute 0.1 0.0 - 0.1 K/uL   Immature Granulocytes 1 %   Abs Immature Granulocytes 0.05 0.00 - 0.07 K/uL    Comment: Performed at [facility], [address], [place], [place] [address]  Comprehensive metabolic panel     Status: Abnormal   Collection Time: 07/12/23  3:29 AM  Result Value Ref Range   Sodium 137 135 - 145 mmol/L   Potassium 3.3 (L) 3.5 - 5.1 mmol/L   Chloride 103 98 - 111 mmol/L   CO2 25 22 - 32 mmol/L   Glucose, Bld 157 (H) 70 - 99 mg/dL    Comment: Glucose reference range applies only to samples taken after fasting for at least 8 hours.   BUN 9 8 - 23 mg/dL   Creatinine, Ser 4.40 0.61 - 1.24 mg/dL   Calcium 8.9 8.9 - 10.2 mg/dL   Total Protein 5.3 (L) 6.5 - 8.1 g/dL   Albumin 3.0 (L) 3.5 - 5.0 g/dL   AST 25 15 - 41 U/L   ALT 12 0 - 44 U/L   Alkaline Phosphatase 61 38 - 126 U/L   Total Bilirubin 0.7 0.3 - 1.2 mg/dL   GFR, Estimated >72 >53 mL/min    Comment: (NOTE) Calculated using the  CKD-EPI Creatinine Equation (2021)    Anion gap 9 5 - 15    Comment: Performed at [facility], [address] [place], [place] [address]  Magnesium     Status: None   Collection Time: 07/12/23  3:29 AM  Result Value Ref Range   Magnesium 2.1 1.7 - 2.4 mg/dL    Comment: Performed at [facility], [address], [place], [place] [address]  Ethanol     Status: None   Collection Time: 07/12/23  3:29 AM  Result Value Ref Range   Alcohol, Ethyl (B) <10 <10 mg/dL    Comment: (NOTE) Lowest detectable limit for serum alcohol is 10 mg/dL.  For medical purposes only. Performed at [facility], [address], [place], [place] [address]   CK     Status: None   Collection Time: 07/12/23  3:29 AM  Result Value Ref Range   Total CK 70 49 - 397 U/L    Comment: Performed at [facility], [address], [place], [place] [address]  Vitamin B12     Status: None   Collection Time: 07/12/23  3:29 AM  Result Value Ref Range   Vitamin B-12 560 180 - 914 pg/mL    Comment: (NOTE) This assay is not validated for testing neonatal or myeloproliferative syndrome specimens for Vitamin B12 levels. Performed at [facility], [address] [place], [place] [address]   TSH     Status: Abnormal   Collection Time: 07/12/23  3:29 AM  Result Value Ref Range   TSH 0.173 (L) 0.350 - 4.500 uIU/mL    Comment: Performed by a 3rd Generation assay with a functional sensitivity of <=0.01 uIU/mL. Performed at [facility], [address] [place], [place] [address]   Troponin I (High Sensitivity)     Status: Abnormal   Collection Time: 07/12/23  3:29 AM  Result Value Ref Range   Troponin I (High Sensitivity) 1,296 (HH) <18 ng/L    Comment: CRITICAL VALUE NOTED. VALUE IS CONSISTENT WITH  PREVIOUSLY REPORTED/CALLED VALUE (NOTE) Elevated high sensitivity troponin I (hsTnI) values and significant  changes across serial measurements may suggest ACS but many other  chronic and  acute conditions are known to elevate hsTnI results.  Refer to the "Links" section for chest pain algorithms and additional  guidance. Performed at [facility], [address], [place], [place] [address]    CT HEAD WO CONTRAST ( )  Result Date: 07/11/2023 CLINICAL DATA:  Initial evaluation for head trauma.  Recent surgery. EXAM: CT HEAD WITHOUT CONTRAST TECHNIQUE: Contiguous axial images were obtained from the base of the skull through the vertex without intravenous contrast. RADIATION DOSE REDUCTION: This exam was performed according to the departmental dose-optimization program which includes automated exposure control, adjustment of the mA and/or kV according to patient size and/or use of iterative reconstruction technique. COMPARISON:  Prior MRI from 05/13/2023. FINDINGS: Brain: Postoperative changes from prior right frontal craniotomy for debulking of patient's right frontal mass again seen. Residual extra-axial collection subjacent to the craniotomy bone flap measures up to 9 mm in thickness. Hyperdensity lining this collection favored to reflect dural thickening for the most part, although a small amount of residual blood products could be present as well. This collection was present on prior MRI. Residual underlying right frontal mass measures approximately 2.6 x 2.9 x 3.8 cm. Improved edema and mass effect within the adjacent right frontal lobe from prior, with improved mass effect on the right lateral ventricle. Previously seen right to left shift has largely resolved. Basilar cisterns remain patent. No other acute intracranial hemorrhage. No acute large vessel territory infarct. No other mass lesion or mass effect. No other extra-axial fluid collection. Vascular: No abnormal hyperdense vessel. Calcified atherosclerosis present at the skull base. Skull: Scalp soft tissues demonstrate no acute finding. Post craniotomy changes on the right. Sinuses/Orbits: Globes and orbital soft tissues  within normal limits. Paranasal sinuses are largely clear. No mastoid effusion. Other: None. IMPRESSION: 1. Postoperative changes from prior right frontal craniotomy for debulking of patient's right frontal mass. Residual underlying right frontal mass measures approximately 2.6 x 2.9 x 3.8 cm. Improved edema and mass effect within the adjacent right frontal lobe, with improved mass effect on the right lateral ventricle. Previously seen right to left shift has largely resolved. 2. Residual extra-axial collection subjacent to the craniotomy bone flap measures up to 9 mm in thickness. Hyperdensity lining this collection favored to reflect dural thickening for the most part, although a small amount of residual blood products could be present as well. This collection was present on prior MRI. 3. No other acute intracranial abnormality. Electronically Signed   By: Rise Mu M.D.   On: 07/11/2023 21:57   CT Angio Chest PE W and/or Wo Contrast  Result Date: 07/11/2023 CLINICAL DATA:  Pulmonary embolism (PE) suspected, high prob. Unresponsive. EXAM: CT ANGIOGRAPHY CHEST WITH CONTRAST TECHNIQUE: Multidetector CT imaging of the chest was performed using the standard protocol during bolus administration of intravenous contrast. Multiplanar CT image reconstructions and MIPs were obtained to evaluate the vascular anatomy. RADIATION DOSE REDUCTION: This exam was performed according to the departmental dose-optimization program which includes automated exposure control, adjustment of the mA and/or kV according to patient size and/or use of iterative reconstruction technique. CONTRAST:  75mL OMNIPAQUE IOHEXOL 350 MG/ML SOLN COMPARISON:  05/06/2023 FINDINGS: Cardiovascular: No filling defects in the pulmonary arteries to suggest pulmonary emboli. Heart borderline in size. Densely calcified coronary arteries diffusely. Scattered aortic atherosclerosis. No aneurysm. Mediastinum/Nodes: No mediastinal, hilar, or  axillary  adenopathy. Trachea and esophagus are unremarkable. Thyroid unremarkable. Lungs/Pleura: Ground-glass airspace opacities in the mid and lower lungs may reflect edema. Bibasilar atelectasis. No effusions. Upper Abdomen: No acute findings. Musculoskeletal: Heart and mediastinal contours are within normal limits. No focal opacities or effusions. No acute bony abnormality. 1 no acute bony abnormality. Review of the MIP images confirms the above findings. IMPRESSION: No evidence of pulmonary embolus. Ground-glass airspace opacities in the mid and lower lungs could reflect edema. Bibasilar atelectasis. Diffuse coronary artery disease. Aortic Atherosclerosis (ICD10-I70.0). Electronically Signed   By: Charlett Nose M.D.   On: 07/11/2023 21:41   DG Chest Port 1 View  Result Date: 07/11/2023 CLINICAL DATA:  Altered mental status. EXAM: PORTABLE CHEST 1 VIEW COMPARISON:  Chest CT with contrast 05/06/2023. FINDINGS: The heart size and mediastinal contours are within normal limits. There is calcification of the transverse aorta. Both lungs are clear apart from scattered linear scarring or atelectasis in the lung bases. Mild chronic elevation right hemidiaphragm. The visualized skeletal structures are intact, with degenerative changes in the spine. IMPRESSION: No evidence of acute chest disease. Aortic atherosclerosis. Electronically Signed   By: Almira Bar M.D.   On: 07/11/2023 20:02    Pending Labs Unresulted Labs (From admission, onward)    None       Vitals/Pain Today's Vitals   07/12/23 0308 07/12/23 0333 07/12/23 0338 07/12/23 0345  BP:    (!) 100/57  Pulse:    83  Resp:    17  Temp:    98.7 F (37.1 C)  TempSrc:    Oral  SpO2:    92%  Weight:      Height:      PainSc: 0-No pain 0-No pain 7  7     Isolation Precautions No active isolations  Medications Medications  acetaminophen (TYLENOL) tablet 650 mg (has no administration in time range)    Or  acetaminophen (TYLENOL) suppository 650  mg (has no administration in time range)  melatonin tablet 3 mg (has no administration in time range)  ondansetron (ZOFRAN) injection 4 mg (has no administration in time range)  insulin aspart (novoLOG) injection 0-6 Units (has no administration in time range)  atorvastatin (LIPITOR) tablet 10 mg (has no administration in time range)  carvedilol (COREG) tablet 6.25 mg (has no administration in time range)  cloNIDine (CATAPRES) tablet 0.1 mg (has no administration in time range)  tamsulosin (FLOMAX) capsule 0.4 mg (has no administration in time range)  temozolomide (TEMODAR) capsule 140 mg (has no administration in time range)  allopurinol (ZYLOPRIM) tablet 100 mg (100 mg Oral Given 07/12/23 0131)  naloxone (NARCAN) injection 0.4 mg (has no administration in time range)  HYDROcodone-acetaminophen (NORCO/VICODIN) 5-325 MG per tablet 1 tablet (1 tablet Oral Not Given 07/12/23 0345)  levETIRAcetam (KEPPRA) tablet 500 mg (500 mg Oral Given 07/12/23 0623)  levETIRAcetam (KEPPRA) IVPB 1000 mg/100 mL premix (0 mg Intravenous Stopped 07/11/23 2036)  ondansetron (ZOFRAN) injection 4 mg (4 mg Intravenous Given 07/11/23 2007)  iohexol (OMNIPAQUE) 350 MG/ML injection 75 mL (75 mLs Intravenous Contrast Given 07/11/23 2135)  magnesium sulfate IVPB 4 g 100 mL (0 g Intravenous Stopped 07/12/23 0308)    Mobility non-ambulatory     Focused Assessments Neuro Assessment Handoff:  Swallow screen pass?            Neuro Assessment: Within Defined Limits Neuro Checks:      Has TPA been given? No If patient is a Neuro Trauma and patient is  going to OR before floor call report to 4N Charge nurse: [phone] or [phone]   R Recommendations: See Admitting Provider Note  Report given to:   Additional Notes:

## 2023-07-12 NOTE — Procedures (Addendum)
Patient Name: Alex Church  MRN: [id number]  Epilepsy Attending: Charlsie Quest  Referring Physician/Provider: Caryl Pina, MD  Date: 07/11/2023 Duration: 23.09 mins  Patient history: 77 y.o. male with a PMHx of B12 deficiency, gout, DM, HLD, HTN, craniotomy 2 months ago for debulking of a right frontal lobe mass concerning for glioblastoma and now receiving chemotherapy. He presents to ED after an episode of loss of consciousness with unresponsiveness and spasmodic sonorous respirations, followed by confusion on arousal. EEG to evaluate for seizure.  Level of alertness: Awake  AEDs during EEG study: LEV  Technical aspects: This EEG study was done with scalp electrodes positioned according to the 10-20 International system of electrode placement. Electrical activity was reviewed with band pass filter of 1-70Hz , sensitivity of 7 uV/mm, display speed of 31mm/sec with a 60Hz  notched filter applied as appropriate. EEG data were recorded continuously and digitally stored.  Video monitoring was available and reviewed as appropriate.  Description: The posterior dominant rhythm consists of 9 Hz activity of moderate voltage (25-35 uV) seen predominantly in posterior head regions, symmetric and reactive to eye opening and eye closing. EEG showed continuous 3 to 6 Hz theta-delta slowing in bifrontal region.  Hyperventilation and photic stimulation were not performed.     ABNORMALITY - Continuous slow, generalized  IMPRESSION: This study is suggestive of cortical dysfunction arising from bifrontal region, likely secondary to underlying craniotomy. No seizures or epileptiform discharges were seen throughout the recording.  Alex Church [name]

## 2023-07-12 NOTE — Assessment & Plan Note (Signed)
BP controlled - Continue clonidine, Coreg - Hold lisinopril

## 2023-07-12 NOTE — Telephone Encounter (Signed)
   Transition of Care Follow-up Phone Call Request    Patient Name: Alex Church Date of Birth: [birth year] Date of Encounter: 07/12/2023  Primary Care Provider:  Wanda Plump, MD Primary Cardiologist:  None  [name] has been scheduled for a transition of care follow up appointment with a [facility] provider:  Patient to be discharged today.  Dr. Ladona Ridgel would like patient discharged on 7-day ZIO monitor.  Also would like to follow-up with patient in 4 weeks, okay to overbook.   Please reach out to [employer] within 48 hours of discharge to confirm appointment and review transition of care protocol questionnaire. Anticipated discharge date: today   [name], Cordelia Poche  07/12/2023, 4:10 PM

## 2023-07-12 NOTE — Progress Notes (Addendum)
  Progress Note   Patient: Alex Church EAV:[id number] DOB: April 24, 1946 DOA: 07/11/2023     0 DOS: the patient was seen and examined on 07/12/2023 at 9:40AM and 1:30PM      Brief hospital course: 77 y.o. M with DM, HTN and glioblastoma s/p craniotomy in Sept on Temodovar who presented with seizure.     Assessment and Plan: * NSTEMI, ruled out Patient noted incidentally to have troponin 200 > 1000.  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 - Give aspirin - Start heparin gtt - Consult Cardiology - Continue statin, BB    Seizure [facility]) From Neurology note: Patient had an "episode of loss of consciousness with unresponsiveness and spasmodic sonorous respirations, followed by confusion on arousal. He was found by wife who administered chest compressions for 5-8 min. Wife states that his head was consistently turned to the left the entire time with slight tremor-like movements. No abnormal limb movements or posturing were noted by his wife. His eyes were closed the entire time. No oral blood or foaming at the mouth, but he was incontinent of urine. EMS arrived and was able to arouse the patient but he had confusion and combative behavior. "  EEG normal, MRI brain showed no significant findings. - Continue Keppra BID - Outpatient follow up with Dr. Barbaraann Cao    Hypomagnesemia - Supplement magnesium  Syncope, ruled out    Glioblastoma, IDH-wildtype (HCC) - Continue Temodar   DM2 (diabetes mellitus, type 2) (HCC) Glucose normal - Continue SS corrections - Hold Actos, Januvia, and metformin  Essential hypertension BP controlled - Continue clonidine, Coreg - Hold lisinopril  Hyperlipidemia - Continue Lipitor  Abnormal TSH - Check T3, free T4        Subjective: Feeling well.  No chest pain, no dyspnea, no swelling.  No further seizures overnight.     Physical Exam: BP 127/79 (BP Location: Right Arm)   Pulse 81   Temp 97.8 F (36.6 C)  (Oral)   Resp 18   Ht 6' (1.829 m)   Wt 88.9 kg   SpO2 94%   BMI 26.58 kg/m   Elderly adult male, lying in bed, interactive and appropriate RRR, no murmurs, no peripheral edema Respiratory rate normal, lungs clear without rales or wheezes Abdomen soft without tenderness palpation or guarding, no ascites or distention Attention normal, affect normal, judgment and insight appear normal   Data Reviewed: Case discussed with cardiology Troponins elevated TSH normal B12 and CK normal Basic metabolic panel unremarkable CBC very mild anemia TSH low    Family Communication: Wife at bedside    Disposition: Inpatient [place] admitted with seizure, found incidetnally to have NSTEMI         Author: Alberteen Sam, MD 07/12/2023 1:43 PM  For on call review [email or web address].

## 2023-07-12 NOTE — Assessment & Plan Note (Signed)
Glucose normal - Continue SS corrections - Hold Actos, Januvia, and metformin

## 2023-07-12 NOTE — Assessment & Plan Note (Signed)
-   Supplement magnesium 

## 2023-07-12 NOTE — Progress Notes (Signed)
STAT EEG complete - results pending. ? ?

## 2023-07-12 NOTE — Discharge Summary (Signed)
Physician Discharge Summary   Patient: Alex Church MRN: [id number] DOB: [birth year]  Admit date:     07/11/2023  Discharge date: 07/12/23  Discharge Physician: Alberteen Sam   PCP: Wanda Plump, MD     Recommendations at discharge:  Follow up with Dr. Barbaraann Cao in 3 days for seizure Follow up with Cardiology in 2 weeks for new cardiomyopathy Referral to Cardiology sent Follow up with PCP Dr. Drue Novel in 1 week for BMP, CBC, and follow up of abnormal troponins and cardiomyopathy     Discharge Diagnoses: Principal Problem:   Seizure Active Problems:   NSTEMI (non-ST elevated myocardial infarction) ruled out   Hyperlipidemia   Essential hypertension   DM2 (diabetes mellitus, type 2) (HCC)   Glioblastoma, IDH-wildtype (HCC)   Syncope, ruled out   Hypomagnesemia      Hospital Course: 77 y.o. M with DM, HTN and glioblastoma s/p craniotomy in Sept on Temodovar who presented with seizure.      * Seizure [facility]) From Neurology note: Patient had an "episode of loss of consciousness with unresponsiveness and spasmodic sonorous respirations, followed by confusion on arousal. He was found by wife who administered chest compressions for 5-8 min. Wife states that his head was consistently turned to the left the entire time with slight tremor-like movements. No abnormal limb movements or posturing were noted by his wife. His eyes were closed the entire time. No oral blood or foaming at the mouth, but he was incontinent of urine. EMS arrived and was able to arouse the patient but he had confusion and combative behavior. "  EEG normal, MRI brain showed no significant findings.  Started on Keppra.    - Outpatient follow up with Dr. Barbaraann Cao   Myocardial injury Patient noted incidentally to have hsTrop 200 > 1000 > 900.  Echocardiogram showed RWMA and reduced EF.  He had no chest pain, no exertional symptoms, no SOB, no clinical signs of CHF.  ECG showed minimal if any STD in inferior  leads, no STE.  Aspirin and heparin were ordered and Cardiology were consulted.   Cardiology felt his symptoms did not represent plaque rupture, and that he was safe for discharge.  They recommended against ASA and Plavix given recent brain surgery.  They recommended continued Coreg, lisinopril, outpatient Zio patch and outpatient evaluation for ischemia.                The [facility] Controlled Substances Registry was reviewed for this patient prior to discharge.   Consultants: Cardiology, Dr. Ladona Ridgel Neurology, Dr. Otelia Limes Procedures performed: MRI brain EEG Echo   Disposition: Home Diet recommendation:  Discharge Diet Orders (From admission, onward)     Start     Ordered   07/12/23 0000  Diet - low sodium heart healthy        07/12/23 1335             DISCHARGE MEDICATION: Allergies as of 07/12/2023   No Known Allergies      Medication List     STOP taking these medications    ciprofloxacin 500 MG tablet Commonly known as: CIPRO       TAKE these medications    acetaminophen 500 MG tablet Commonly known as: TYLENOL Take 1,000 mg by mouth every 6 (six) hours as needed for mild pain (pain score 1-3) (Tries to only take twice a week).   allopurinol 100 MG tablet Commonly known as: ZYLOPRIM Take 1 tablet (100 mg total) by mouth 2 (  two) times daily.   atorvastatin 10 MG tablet Commonly known as: LIPITOR Take 1 tablet (10 mg total) by mouth at bedtime.   carvedilol 12.5 MG tablet Commonly known as: COREG Take 0.5 tablets (6.25 mg total) by mouth 2 (two) times daily with a meal. Due for appt 05/2023   cloNIDine 0.1 MG tablet Commonly known as: CATAPRES Take 1 tablet (0.1 mg total) by mouth 2 (two) times daily.   glucose blood test strip Check blood sugars once daily   ibuprofen 200 MG tablet Commonly known as: ADVIL Take 600 mg by mouth every 6 (six) hours as needed for mild pain (pain score 1-3).   levETIRAcetam 500 MG  tablet Commonly known as: KEPPRA Take 1 tablet (500 mg total) by mouth 2 (two) times daily.   lisinopril 20 MG tablet Commonly known as: ZESTRIL Take 20 mg by mouth at bedtime.   melatonin 5 MG Tabs Take 1 tablet (5 mg total) by mouth at bedtime. What changed: how much to take   metFORMIN 1000 MG tablet Commonly known as: GLUCOPHAGE Take 0.5 tablets (500 mg total) by mouth 2 (two) times daily with a meal.   ondansetron 8 MG tablet Commonly known as: Zofran Take 1 tablet (8 mg total) by mouth every 8 (eight) hours as needed for nausea or vomiting. May take 30-60 minutes prior to Temodar administration if nausea/vomiting occurs as needed. What changed:  when to take this additional instructions   onetouch ultrasoft lancets Check bloods sugars once daily   OneTouch Verio Flex System w/Device Kit Check blood sugar once daily   pioglitazone 30 MG tablet Commonly known as: ACTOS Take 1 tablet (30 mg total) by mouth daily.   polyethylene glycol 17 g packet Commonly known as: MIRALAX / GLYCOLAX Take 17 g by mouth daily as needed for mild constipation.   sitaGLIPtin 100 MG tablet Commonly known as: Januvia Take 1 tablet (100 mg total) by mouth daily. What changed: when to take this   sodium chloride 1 g tablet Take 1 tablet (1 g total) by mouth 3 (three) times daily with meals.   tamsulosin 0.4 MG Caps capsule Commonly known as: FLOMAX Take 1 capsule (0.4 mg total) by mouth daily after supper.   temozolomide 140 MG capsule Commonly known as: TEMODAR Take 1 capsule (140 mg total) by mouth at bedtime. May take on an empty stomach to decrease nausea & vomiting.   Vitamin B-12 1000 MCG/15ML Liqd Take 1,000 mcg by mouth daily.         Discharge Instructions     Diet - low sodium heart healthy   Complete by: As directed    Discharge instructions   Complete by: As directed    **IMPORTANT DISCHARGE INSTRUCTIONS**   From Dr. Maryfrances Bunnell: You were admitted for a  seizure. Here, you had an EEG that showed no ongoing seizure activity.  You also also had an MRI that showed no concerning findings.  Dr. Barbaraann Cao will be best able to interpret the changes they do see, to know if this is expected postoperative change, or something related to the cancer.  We have sent him the results, and you should follow up with him on Thursday  Take Keppra 500 mg twice daily  Do not drive for 6 months   With regard to your heart, you had abnormal heart enzyme testing and abnormal echocardiogram These appeared to show a heart attack, although you had absolutely on symptoms of a heart attack, making this seem less  likely  You were evaluated by cardiology who recommended you continue your carvedilol and lisinopril and folow up with Cardiology in the office  They will reach out to you to arrange.  Please see Dr. Drue Novel in 1 week   RETURN for any chest pain with exertion, shortness of breath with exertion or other feelings of chest discomfort   Increase activity slowly   Complete by: As directed        Discharge Exam: Filed Weights   07/11/23 1947  Weight: 88.9 kg    General: Pt is alert, awake, not in acute distress Cardiovascular: RRR, nl S1-S2, no murmurs appreciated.   Slightly nonpitting LE edema of feet, JVP normal.   Respiratory: Normal respiratory rate and rhythm.  CTAB without rales or wheezes. Abdominal: Abdomen soft and non-tender.  No distension or HSM.   Neuro/Psych: Strength symmetric in upper and lower extremities.  Judgment and insight appear normal.   Condition at discharge: good  The results of significant diagnostics from this hospitalization (including imaging, microbiology, ancillary and laboratory) are listed below for reference.   Imaging Studies: MR BRAIN W WO CONTRAST  Result Date: 07/12/2023 CLINICAL DATA:  Seizure EXAM: MRI HEAD WITHOUT AND WITH CONTRAST TECHNIQUE: Multiplanar, multiecho pulse sequences of the brain and surrounding  structures were obtained without and with intravenous contrast. CONTRAST:  9mL GADAVIST GADOBUTROL 1 MMOL/ML IV SOLN COMPARISON:  CT head 07/11/2023, MR head 05/13/2023 FINDINGS: Brain: Postsurgical changes reflecting right frontal craniotomy for mass resection are again noted. The resection cavity is contracted since the prior study, with significantly decreased surrounding edema and mass effect. Previously seen leftward midline shift has resolved. Curvilinear enhancement at the periphery of the resection cavity is increased in conspicuity since the prior study (12-30). There is no masslike enhancement. Extra-axial fluid underlying the craniotomy measuring up to 8 mm in thickness with associated dural thickening and enhancement is noted, presumed postsurgical. There is additional extra-axial fluid more posteriorly overlying the right parietal lobe and falx measuring up to 5 mm in thickness. There is no SWI signal dropout within this collection to suggest acute blood. There is no evidence of acute intracranial hemorrhage, extra-axial fluid collection, or acute infarct. Background parenchymal volume is normal. The ventricles are now normal in size with re-expansion of the right lateral ventricle. The pituitary and suprasellar region are normal. Vascular: Normal flow voids. Skull and upper cervical spine: Postsurgical changes as above. There is no suspicious marrow signal abnormality. Sinuses/Orbits: The paranasal sinuses are clear. The globes and orbits are unremarkable. Other: The mastoid air cells and middle ear cavities are clear. IMPRESSION: 1. The right frontal resection cavity has decreased in size since 05/13/2023, with significantly improved surrounding edema and resolved leftward midline shift. 2. However, peripheral enhancement around the resection cavity has increased, which may reflect evolving posttreatment change or residual disease. 3. Extra-axial fluid and dural thickening overlying the right cerebral  hemisphere, likely postsurgical and without significant mass effect. Electronically Signed   By: Lesia Hausen M.D.   On: 07/12/2023 13:10   ECHOCARDIOGRAM COMPLETE  Result Date: 07/12/2023    ECHOCARDIOGRAM REPORT   Patient Name:   Alex Church Date of Exam: 07/12/2023 Medical Rec #:  [id number]      Height:       72.0 in Accession #:    [id number]     Weight:       196.0 lb Date of Birth:  1946/04/01       BSA:  2.112 m Patient Age:    77 years       BP:           100/57 mmHg Patient Gender: M              HR:           83 bpm. Exam Location:  Inpatient Procedure: 2D Echo, Cardiac Doppler and Color Doppler Indications:    Syncope R55  History:        Patient has prior history of Echocardiogram examinations, most                 recent 08/17/2018. Arrythmias:PVC, Signs/Symptoms:Syncope; Risk                 Factors:Hypertension, Diabetes and Dyslipidemia.  Sonographer:    Lucendia Herrlich RCS Referring Phys: [id number] Angie Fava  Sonographer Comments: Image acquisition challenging due to patient body habitus. IMPRESSIONS  1. Apical hypokinesis with overall mild LV dysfunction.  2. Left ventricular ejection fraction, by estimation, is 45 to 50%. The left ventricle has mildly decreased function. The left ventricle demonstrates regional wall motion abnormalities (see scoring diagram/findings for description). Left ventricular diastolic parameters were normal.  3. Right ventricular systolic function is normal. The right ventricular size is normal. There is mildly elevated pulmonary artery systolic pressure.  4. Left atrial size was mildly dilated.  5. The mitral valve is normal in structure. Trivial mitral valve regurgitation. No evidence of mitral stenosis.  6. The aortic valve has an indeterminant number of cusps. Aortic valve regurgitation is not visualized. No aortic stenosis is present.  7. The inferior vena cava is dilated in size with >50% respiratory variability, suggesting right atrial pressure  of 8 mmHg. Comparison(s): No prior Echocardiogram. FINDINGS  Left Ventricle: Left ventricular ejection fraction, by estimation, is 45 to 50%. The left ventricle has mildly decreased function. The left ventricle demonstrates regional wall motion abnormalities. The left ventricular internal cavity size was normal in size. There is no left ventricular hypertrophy. Left ventricular diastolic parameters were normal. Right Ventricle: The right ventricular size is normal. Right ventricular systolic function is normal. There is mildly elevated pulmonary artery systolic pressure. The tricuspid regurgitant velocity is 3.02 m/s, and with an assumed right atrial pressure of 8 mmHg, the estimated right ventricular systolic pressure is 44.5 mmHg. Left Atrium: Left atrial size was mildly dilated. Right Atrium: Right atrial size was normal in size. Pericardium: There is no evidence of pericardial effusion. Mitral Valve: The mitral valve is normal in structure. Mild mitral annular calcification. Trivial mitral valve regurgitation. No evidence of mitral valve stenosis. Tricuspid Valve: The tricuspid valve is normal in structure. Tricuspid valve regurgitation is mild . No evidence of tricuspid stenosis. Aortic Valve: The aortic valve has an indeterminant number of cusps. Aortic valve regurgitation is not visualized. No aortic stenosis is present. Aortic valve peak gradient measures 8.5 mmHg. Pulmonic Valve: The pulmonic valve was not well visualized. Pulmonic valve regurgitation is not visualized. No evidence of pulmonic stenosis. Aorta: The aortic root is normal in size and structure. Venous: The inferior vena cava is dilated in size with greater than 50% respiratory variability, suggesting right atrial pressure of 8 mmHg. IAS/Shunts: No atrial level shunt detected by color flow Doppler. Additional Comments: Apical hypokinesis with overall mild LV dysfunction.  LEFT VENTRICLE PLAX 2D LVIDd:         5.10 cm   Diastology LVIDs:          3.70 cm  LV e' medial:    8.24 cm/s LV PW:         1.10 cm   LV E/e' medial:  10.9 LV IVS:        1.10 cm   LV e' lateral:   10.40 cm/s LVOT diam:     2.00 cm   LV E/e' lateral: 8.6 LV SV:         53 LV SV Index:   25 LVOT Area:     3.14 cm  RIGHT VENTRICLE             IVC RV S prime:     16.60 cm/s  IVC diam: 2.50 cm TAPSE (M-mode): 2.2 cm LEFT ATRIUM             Index        RIGHT ATRIUM           Index LA diam:        4.10 cm 1.94 cm/m   RA Area:     12.60 cm LA Vol (A2C):   73.9 ml 34.98 ml/m  RA Volume:   27.60 ml  13.07 ml/m LA Vol (A4C):   72.0 ml 34.09 ml/m LA Biplane Vol: 75.0 ml 35.51 ml/m  AORTIC VALVE AV Area (Vmax): 1.92 cm AV Vmax:        146.00 cm/s AV Peak Grad:   8.5 mmHg LVOT Vmax:      89.33 cm/s LVOT Vmean:     58.333 cm/s LVOT VTI:       0.168 m  AORTA Ao Root diam: 3.30 cm Ao Asc diam:  3.50 cm MITRAL VALVE               TRICUSPID VALVE MV Area (PHT): 5.38 cm    TR Peak grad:   36.5 mmHg MV Decel Time: 141 msec    TR Vmax:        302.00 cm/s MR Peak grad: 65.3 mmHg MR Vmax:      404.00 cm/s  SHUNTS MV E velocity: 89.70 cm/s  Systemic VTI:  0.17 m MV A velocity: 77.10 cm/s  Systemic Diam: 2.00 cm MV E/A ratio:  1.16 Olga Millers MD Electronically signed by Olga Millers MD Signature Date/Time: 07/12/2023/11:03:32 AM    Final    EEG adult  Result Date: 07/12/2023 Charlsie Quest, MD     07/12/2023  7:50 AM Patient Name: Alex Church MRN: [id number] Epilepsy Attending: Charlsie Quest Referring Physician/Provider: Caryl Pina, MD Date: 07/11/2023 Duration: 23.09 mins Patient history: 77 y.o. male with a PMHx of B12 deficiency, gout, DM, HLD, HTN, craniotomy 2 months ago for debulking of a right frontal lobe mass concerning for glioblastoma and now receiving chemotherapy. He presents to ED after an episode of loss of consciousness with unresponsiveness and spasmodic sonorous respirations, followed by confusion on arousal. EEG to evaluate for seizure. Level of alertness: Awake  AEDs during EEG study: LEV Technical aspects: This EEG study was done with scalp electrodes positioned according to the 10-20 International system of electrode placement. Electrical activity was reviewed with band pass filter of 1-70Hz , sensitivity of 7 uV/mm, display speed of 42mm/sec with a 60Hz  notched filter applied as appropriate. EEG data were recorded continuously and digitally stored.  Video monitoring was available and reviewed as appropriate. Description: The posterior dominant rhythm consists of 9 Hz activity of moderate voltage (25-35 uV) seen predominantly in posterior head regions, symmetric and reactive to eye opening and eye closing. EEG showed continuous  3 to 6 Hz theta-delta slowing in bifrontal region.  Hyperventilation and photic stimulation were not performed.   ABNORMALITY - Continuous slow, generalized IMPRESSION: This study is suggestive of cortical dysfunction arising from bifrontal region, likely secondary to underlying craniotomy. No seizures or epileptiform discharges were seen throughout the recording. Priyanka [name]   CT HEAD WO CONTRAST ( )  Result Date: 07/11/2023 CLINICAL DATA:  Initial evaluation for head trauma.  Recent surgery. EXAM: CT HEAD WITHOUT CONTRAST TECHNIQUE: Contiguous axial images were obtained from the base of the skull through the vertex without intravenous contrast. RADIATION DOSE REDUCTION: This exam was performed according to the departmental dose-optimization program which includes automated exposure control, adjustment of the mA and/or kV according to patient size and/or use of iterative reconstruction technique. COMPARISON:  Prior MRI from 05/13/2023. FINDINGS: Brain: Postoperative changes from prior right frontal craniotomy for debulking of patient's right frontal mass again seen. Residual extra-axial collection subjacent to the craniotomy bone flap measures up to 9 mm in thickness. Hyperdensity lining this collection favored to reflect dural  thickening for the most part, although a small amount of residual blood products could be present as well. This collection was present on prior MRI. Residual underlying right frontal mass measures approximately 2.6 x 2.9 x 3.8 cm. Improved edema and mass effect within the adjacent right frontal lobe from prior, with improved mass effect on the right lateral ventricle. Previously seen right to left shift has largely resolved. Basilar cisterns remain patent. No other acute intracranial hemorrhage. No acute large vessel territory infarct. No other mass lesion or mass effect. No other extra-axial fluid collection. Vascular: No abnormal hyperdense vessel. Calcified atherosclerosis present at the skull base. Skull: Scalp soft tissues demonstrate no acute finding. Post craniotomy changes on the right. Sinuses/Orbits: Globes and orbital soft tissues within normal limits. Paranasal sinuses are largely clear. No mastoid effusion. Other: None. IMPRESSION: 1. Postoperative changes from prior right frontal craniotomy for debulking of patient's right frontal mass. Residual underlying right frontal mass measures approximately 2.6 x 2.9 x 3.8 cm. Improved edema and mass effect within the adjacent right frontal lobe, with improved mass effect on the right lateral ventricle. Previously seen right to left shift has largely resolved. 2. Residual extra-axial collection subjacent to the craniotomy bone flap measures up to 9 mm in thickness. Hyperdensity lining this collection favored to reflect dural thickening for the most part, although a small amount of residual blood products could be present as well. This collection was present on prior MRI. 3. No other acute intracranial abnormality. Electronically Signed   By: Rise Mu M.D.   On: 07/11/2023 21:57   CT Angio Chest PE W and/or Wo Contrast  Result Date: 07/11/2023 CLINICAL DATA:  Pulmonary embolism (PE) suspected, high prob. Unresponsive. EXAM: CT ANGIOGRAPHY CHEST  WITH CONTRAST TECHNIQUE: Multidetector CT imaging of the chest was performed using the standard protocol during bolus administration of intravenous contrast. Multiplanar CT image reconstructions and MIPs were obtained to evaluate the vascular anatomy. RADIATION DOSE REDUCTION: This exam was performed according to the departmental dose-optimization program which includes automated exposure control, adjustment of the mA and/or kV according to patient size and/or use of iterative reconstruction technique. CONTRAST:  75mL OMNIPAQUE IOHEXOL 350 MG/ML SOLN COMPARISON:  05/06/2023 FINDINGS: Cardiovascular: No filling defects in the pulmonary arteries to suggest pulmonary emboli. Heart borderline in size. Densely calcified coronary arteries diffusely. Scattered aortic atherosclerosis. No aneurysm. Mediastinum/Nodes: No mediastinal, hilar, or axillary adenopathy. Trachea and esophagus are unremarkable. Thyroid unremarkable.  Lungs/Pleura: Ground-glass airspace opacities in the mid and lower lungs may reflect edema. Bibasilar atelectasis. No effusions. Upper Abdomen: No acute findings. Musculoskeletal: Heart and mediastinal contours are within normal limits. No focal opacities or effusions. No acute bony abnormality. 1 no acute bony abnormality. Review of the MIP images confirms the above findings. IMPRESSION: No evidence of pulmonary embolus. Ground-glass airspace opacities in the mid and lower lungs could reflect edema. Bibasilar atelectasis. Diffuse coronary artery disease. Aortic Atherosclerosis (ICD10-I70.0). Electronically Signed   By: Charlett Nose M.D.   On: 07/11/2023 21:41   DG Chest Port 1 View  Result Date: 07/11/2023 CLINICAL DATA:  Altered mental status. EXAM: PORTABLE CHEST 1 VIEW COMPARISON:  Chest CT with contrast 05/06/2023. FINDINGS: The heart size and mediastinal contours are within normal limits. There is calcification of the transverse aorta. Both lungs are clear apart from scattered linear scarring or  atelectasis in the lung bases. Mild chronic elevation right hemidiaphragm. The visualized skeletal structures are intact, with degenerative changes in the spine. IMPRESSION: No evidence of acute chest disease. Aortic atherosclerosis. Electronically Signed   By: Almira Bar M.D.   On: 07/11/2023 20:02    Microbiology: Results for orders placed or performed in visit on 06/25/23  Urine Culture     Status: Abnormal   Collection Time: 06/25/23 10:02 AM   Specimen: Urine  Result Value Ref Range Status   MICRO NUMBER: [id number]  Final   SPECIMEN QUALITY: Adequate  Final   Sample Source URINE  Final   STATUS: FINAL  Final   ISOLATE 1: Pseudomonas aeruginosa (A)  Final    Comment: Greater than 100,000 CFU/mL of Pseudomonas aeruginosa   COMMENT:   Final    Additional non-predominating organism(s) isolated. These organisms, commonly found on external and internal genitalia, are considered colonizers. No further testing performed.      Susceptibility   Pseudomonas aeruginosa - URINE CULTURE, REFLEX    CEFTAZIDIME 4 Sensitive     CEFEPIME 2 Sensitive     CIPROFLOXACIN <=0.25 Sensitive     LEVOFLOXACIN 1 Sensitive     GENTAMICIN <=1 Sensitive     IMIPENEM 2 Sensitive     PIP/TAZO 8 Sensitive     TOBRAMYCIN* <=1 Sensitive      * Legend: S = Susceptible  I = Intermediate R = Resistant  NS = Not susceptible SDD = Susceptible Dose Dependent * = Not Tested  NR = Not Reported **NN = See Therapy Comments     Labs: CBC: Recent Labs  Lab 07/11/23 1951 07/11/23 2005 07/12/23 0329  WBC 8.8  --  8.9  NEUTROABS 4.7  --  6.5  HGB 11.2* 11.9* 10.5*  HCT 36.2* 35.0* 33.4*  MCV 98.1  --  96.8  PLT 201  --  180   Basic Metabolic Panel: Recent Labs  Lab 07/11/23 1951 07/11/23 2005 07/11/23 2232 07/12/23 0329  NA 137 138  --  137  K 3.9 3.8  --  3.3*  CL 102 100  --  103  CO2 21*  --   --  25  GLUCOSE 196* 193*  --  157*  BUN 14 13  --  9  CREATININE 1.18 1.10  --  0.96  CALCIUM 9.3   --   --  8.9  MG  --   --  1.1* 2.1   Liver Function Tests: Recent Labs  Lab 07/11/23 1951 07/12/23 0329  AST 24 25  ALT 11 12  ALKPHOS 64 61  BILITOT 0.7 0.7  PROT 6.2* 5.3*  ALBUMIN 3.2* 3.0*   CBG: Recent Labs  Lab 07/12/23 1300  GLUCAP 146*    Discharge time spent: approximately 35 minutes spent on discharge counseling, evaluation of patient on day of discharge, and coordination of discharge planning with nursing, social work, pharmacy and case management  Signed: Alberteen Sam, MD [facility] 07/12/2023

## 2023-07-12 NOTE — Assessment & Plan Note (Signed)
-  Continue Lipitor °

## 2023-07-12 NOTE — Progress Notes (Signed)
PHARMACY - ANTICOAGULATION CONSULT NOTE  Pharmacy Consult for heparin Indication: ACS/STEMI  No Known Allergies  Patient Measurements: Height: 6' (182.9 cm) Weight: 88.9 kg (196 lb) IBW/kg (Calculated) : 77.6 Heparin Dosing Weight: 88.9 kg   Vital Signs: Temp: 97.8 F (36.6 C) (11/03 0819) Temp Source: Oral (11/03 0819) BP: 127/79 (11/03 0819) Pulse Rate: 81 (11/03 0750)  Labs: Recent Labs    07/11/23 1951 07/11/23 2005 07/11/23 2224 07/12/23 0329  HGB 11.2* 11.9*  --  10.5*  HCT 36.2* 35.0*  --  33.4*  PLT 201  --   --  180  CREATININE 1.18 1.10  --  0.96  CKTOTAL  --   --   --  70  TROPONINIHS 15  --  381* 1,296*    Estimated Creatinine Clearance: 70.7 mL/min (by C-G formula based on SCr of 0.96 mg/dL).   Medical History: Past Medical History:  Diagnosis Date   B12 deficiency    monthly shots   Diabetes mellitus    Glaucoma suspect    Glioblastoma (HCC)    Hyperlipemia    Hypertension     Medications:  Medications Prior to Admission  Medication Sig Dispense Refill Last Dose   acetaminophen (TYLENOL) 500 MG tablet Take 1,000 mg by mouth every 6 (six) hours as needed for mild pain (pain score 1-3) (Tries to only take twice a week).   Past Week   allopurinol (ZYLOPRIM) 100 MG tablet Take 1 tablet (100 mg total) by mouth 2 (two) times daily. 180 tablet 1 07/11/2023   atorvastatin (LIPITOR) 10 MG tablet Take 1 tablet (10 mg total) by mouth at bedtime. 90 tablet 1 07/10/2023   Blood Glucose Monitoring Suppl (ONETOUCH VERIO FLEX SYSTEM) w/Device KIT Check blood sugar once daily 1 kit 0    carvedilol (COREG) 12.5 MG tablet Take 0.5 tablets (6.25 mg total) by mouth 2 (two) times daily with a meal. Due for appt 05/2023   07/11/2023 at 0900   cloNIDine (CATAPRES) 0.1 MG tablet Take 1 tablet (0.1 mg total) by mouth 2 (two) times daily. 180 tablet 1 07/11/2023   Cyanocobalamin (VITAMIN B-12) 1000 MCG/15ML LIQD Take 1,000 mcg by mouth daily.   07/11/2023   glucose blood test  strip Check blood sugars once daily 100 each 12    ibuprofen (ADVIL) 200 MG tablet Take 600 mg by mouth every 6 (six) hours as needed for mild pain (pain score 1-3).   Past Week   Lancets (ONETOUCH ULTRASOFT) lancets Check bloods sugars once daily 100 each 12    melatonin 5 MG TABS Take 1 tablet (5 mg total) by mouth at bedtime. (Patient taking differently: Take 20 mg by mouth at bedtime.) 30 tablet 0 07/10/2023   metFORMIN (GLUCOPHAGE) 1000 MG tablet Take 0.5 tablets (500 mg total) by mouth 2 (two) times daily with a meal.   07/11/2023   pioglitazone (ACTOS) 30 MG tablet Take 1 tablet (30 mg total) by mouth daily.   07/10/2023   polyethylene glycol (MIRALAX / GLYCOLAX) 17 g packet Take 17 g by mouth daily as needed for mild constipation. 14 each 0 Past Week   sodium chloride 1 g tablet Take 1 tablet (1 g total) by mouth 3 (three) times daily with meals. 90 tablet 0 Past Month   tamsulosin (FLOMAX) 0.4 MG CAPS capsule Take 1 capsule (0.4 mg total) by mouth daily after supper. 90 capsule 0 07/10/2023   temozolomide (TEMODAR) 140 MG capsule Take 1 capsule (140 mg total) by mouth  at bedtime. May take on an empty stomach to decrease nausea & vomiting. 42 capsule 0 07/10/2023   ciprofloxacin (CIPRO) 500 MG tablet Take 1 tablet (500 mg total) by mouth 2 (two) times daily. (Patient not taking: Reported on 07/11/2023) 10 tablet 0 Not Taking   lisinopril (ZESTRIL) 20 MG tablet Take 20 mg by mouth at bedtime.      ondansetron (ZOFRAN) 8 MG tablet Take 1 tablet (8 mg total) by mouth every 8 (eight) hours as needed for nausea or vomiting. May take 30-60 minutes prior to Temodar administration if nausea/vomiting occurs as needed. (Patient taking differently: Take 8 mg by mouth at bedtime. Take every evening before taking chemotherapy.) 30 tablet 1    sitaGLIPtin (JANUVIA) 100 MG tablet Take 1 tablet (100 mg total) by mouth daily. (Patient taking differently: Take 100 mg by mouth daily after supper.)      Scheduled:    allopurinol  100 mg Oral BID   aspirin  324 mg Oral Once   atorvastatin  10 mg Oral QHS   carvedilol  6.25 mg Oral BID WC   cloNIDine  0.1 mg Oral BID   insulin aspart  0-6 Units Subcutaneous TID WC   levETIRAcetam  500 mg Oral BID   tamsulosin  0.4 mg Oral QPC supper   temozolomide  140 mg Oral QHS   PRN: acetaminophen **OR** acetaminophen, HYDROcodone-acetaminophen, melatonin, naLOXone (NARCAN)  injection, ondansetron (ZOFRAN) IV, ondansetron  Assessment: 77 yom presenting with ACS/NSTEMI. 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 CBC stable - hgb 10.5 and plts 180.   Goal of Therapy:  Heparin level 0.3-0.7 units/ml Monitor platelets by anticoagulation protocol: Yes   Plan:  Give 4,000 units bolus x 1 Start heparin infusion at 1,100 units/hr Check anti-Xa level in 8 hours and daily while on heparin Continue to monitor H&H and platelets  Roslyn Smiling, PharmD PGY1 Pharmacy Resident 07/12/2023 2:42 PM

## 2023-07-12 NOTE — Care Management Obs Status (Signed)
MEDICARE OBSERVATION STATUS NOTIFICATION   Patient Details  Name: Alex Church MRN: [id number] Date of Birth: 1946/02/17   Medicare Observation Status Notification Given:  Yes    Ronny Bacon, RN 07/12/2023, 1:41 PM

## 2023-07-12 NOTE — Hospital Course (Signed)
77 y.o. M with DM, HTN and glioblastoma s/p craniotomy in Sept on Temodovar who presented with seizure.

## 2023-07-12 NOTE — Assessment & Plan Note (Addendum)
From Neurology note: Patient had an "episode of loss of consciousness with unresponsiveness and spasmodic sonorous respirations, followed by confusion on arousal. He was found by wife who administered chest compressions for 5-8 min. Wife states that his head was consistently turned to the left the entire time with slight tremor-like movements. No abnormal limb movements or posturing were noted by his wife. His eyes were closed the entire time. No oral blood or foaming at the mouth, but he was incontinent of urine. EMS arrived and was able to arouse the patient but he had confusion and combative behavior. "  EEG normal, MRI brain showed no significant findings. - Continue Keppra BID - Outpatient follow up with Dr. Barbaraann Cao

## 2023-07-12 NOTE — Progress Notes (Signed)
Echocardiogram 2D Echocardiogram has been performed.  Alex Church 07/12/2023, 9:19 AM

## 2023-07-13 ENCOUNTER — Ambulatory Visit: Payer: Medicare Other | Attending: Cardiology

## 2023-07-13 ENCOUNTER — Ambulatory Visit
Admission: RE | Admit: 2023-07-13 | Discharge: 2023-07-13 | Disposition: A | Discharge status: Home / Self Care | Payer: Medicare Other | Source: Ambulatory Visit | Attending: Radiation Oncology | Admitting: Radiation Oncology

## 2023-07-13 ENCOUNTER — Other Ambulatory Visit: Payer: Self-pay

## 2023-07-13 ENCOUNTER — Other Ambulatory Visit: Payer: Self-pay | Admitting: Cardiology

## 2023-07-13 ENCOUNTER — Ambulatory Visit (HOSPITAL_COMMUNITY): Payer: Medicare Other

## 2023-07-13 DIAGNOSIS — I5020 Unspecified systolic (congestive) heart failure: Secondary | ICD-10-CM

## 2023-07-13 DIAGNOSIS — C711 Malignant neoplasm of frontal lobe: Secondary | ICD-10-CM | POA: Diagnosis not present

## 2023-07-13 DIAGNOSIS — R404 Transient alteration of awareness: Secondary | ICD-10-CM

## 2023-07-13 DIAGNOSIS — Z510 Encounter for antineoplastic radiation therapy: Secondary | ICD-10-CM | POA: Diagnosis not present

## 2023-07-13 LAB — RAD ONC ARIA SESSION SUMMARY
Course Elapsed Days: 25
Plan Fractions Treated to Date: 17
Plan Prescribed Dose Per Fraction: 2 Gy
Plan Total Fractions Prescribed: 23
Plan Total Prescribed Dose: 46 Gy
Reference Point Dosage Given to Date: 34 Gy
Reference Point Session Dosage Given: 2 Gy
Session Number: 17

## 2023-07-13 NOTE — Telephone Encounter (Signed)
Sent [place] scheduling team a message to schedule pt for 4w f/u with GT- okay to overbook per GT. Scheduling to send appointment time/date back to triage for phone call.

## 2023-07-13 NOTE — Progress Notes (Unsigned)
Enrolled for [employer] to mail a ZIO XT long term holter monitor to the patients address on file.  °Dr. Taylor to read. °

## 2023-07-13 NOTE — Telephone Encounter (Signed)
Patient is scheduled for see GT in [place] on 11/21 at 1515. He had openings so no need to overbook. Sent staff msg to [employer] with date and, time.

## 2023-07-13 NOTE — Telephone Encounter (Signed)
Patient contacted regarding discharge from [facility] on 07/12/2023.  Patient understands to follow up with provider Lewayne Bunting, MD on 07/30/2023 at 3:15 at [facility]. Patient understands discharge instructions? Yes Patient understands medications and regiment? Yes Patient understands to bring all medications to this visit? Yes  Ask patient:  Are you enrolled in My Chart Yes   Pt/Pt's wife had no questions or concerns at this time.

## 2023-07-14 ENCOUNTER — Ambulatory Visit (HOSPITAL_COMMUNITY): Payer: Medicare Other | Attending: Physical Medicine and Rehabilitation | Admitting: Occupational Therapy

## 2023-07-14 ENCOUNTER — Encounter (HOSPITAL_COMMUNITY): Payer: Medicare Other | Admitting: Speech Pathology

## 2023-07-14 ENCOUNTER — Encounter (HOSPITAL_COMMUNITY): Payer: Self-pay | Admitting: Occupational Therapy

## 2023-07-14 ENCOUNTER — Other Ambulatory Visit: Payer: Self-pay

## 2023-07-14 ENCOUNTER — Encounter: Payer: Self-pay | Admitting: Internal Medicine

## 2023-07-14 ENCOUNTER — Ambulatory Visit
Admission: RE | Admit: 2023-07-14 | Discharge: 2023-07-14 | Disposition: A | Discharge status: Home / Self Care | Payer: Medicare Other | Source: Ambulatory Visit | Attending: Radiation Oncology | Admitting: Radiation Oncology

## 2023-07-14 DIAGNOSIS — R262 Difficulty in walking, not elsewhere classified: Secondary | ICD-10-CM | POA: Insufficient documentation

## 2023-07-14 DIAGNOSIS — R41841 Cognitive communication deficit: Secondary | ICD-10-CM | POA: Diagnosis not present

## 2023-07-14 DIAGNOSIS — R278 Other lack of coordination: Secondary | ICD-10-CM | POA: Diagnosis not present

## 2023-07-14 DIAGNOSIS — R29898 Other symptoms and signs involving the musculoskeletal system: Secondary | ICD-10-CM | POA: Insufficient documentation

## 2023-07-14 DIAGNOSIS — R29818 Other symptoms and signs involving the nervous system: Secondary | ICD-10-CM | POA: Insufficient documentation

## 2023-07-14 DIAGNOSIS — C711 Malignant neoplasm of frontal lobe: Secondary | ICD-10-CM | POA: Diagnosis not present

## 2023-07-14 DIAGNOSIS — Z510 Encounter for antineoplastic radiation therapy: Secondary | ICD-10-CM | POA: Diagnosis not present

## 2023-07-14 LAB — RAD ONC ARIA SESSION SUMMARY
Course Elapsed Days: 26
Plan Fractions Treated to Date: 18
Plan Prescribed Dose Per Fraction: 2 Gy
Plan Total Fractions Prescribed: 23
Plan Total Prescribed Dose: 46 Gy
Reference Point Dosage Given to Date: 36 Gy
Reference Point Session Dosage Given: 2 Gy
Session Number: 18

## 2023-07-14 NOTE — Therapy (Unsigned)
OUTPATIENT OCCUPATIONAL THERAPY NEURO TREATMENT NOTE  Patient Name: Alex Church MRN: [id number] DOB:03/07/1946, 77 y.o., male Today's Date: 07/15/2023  PCP: Willow Ora, MD REFERRING PROVIDER: Delle Reining, PA-C  END OF SESSION:  OT End of Session - 07/14/23 1346     Visit Number 5    Number of Visits 7    Date for OT Re-Evaluation 07/31/23    Authorization Type BCBS Medicare    OT Start Time 1308    OT Stop Time 1346    OT Time Calculation (min) 38 min    Activity Tolerance Patient tolerated treatment well    Behavior During Therapy WFL for tasks assessed/performed              Past Medical History:  Diagnosis Date   B12 deficiency    monthly shots   Diabetes mellitus    Glaucoma suspect    Glioblastoma (HCC)    Hyperlipemia    Hypertension    Past Surgical History:  Procedure Laterality Date   APPLICATION OF CRANIAL NAVIGATION Right 05/12/2023   Procedure: APPLICATION OF CRANIAL NAVIGATION;  Surgeon: Bethann Goo, DO;  Location: [facility] OR;  Service: Neurosurgery;  Laterality: Right;   CRANIOTOMY Right 05/12/2023   Procedure: RIGHT STERIOSTACTIC FRONTAL CRANIOTOMY FOR TUMOR RESECTION;  Surgeon: Bethann Goo, DO;  Location: [facility] OR;  Service: Neurosurgery;  Laterality: Right;   Patient Active Problem List   Diagnosis Date Noted   Seizure (HCC) 07/12/2023   Hypomagnesemia 07/12/2023   Myocardial injury 07/12/2023   Syncope, ruled out 07/11/2023   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   Glioblastoma, 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   Left-sided weakness 05/06/2023   PVC (premature ventricular contraction) 08/27/2022   Glaucoma suspect 04/30/2020   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   Annual physical exam 06/30/2016   PCP NOTES >>>>>>>>>>>>>>>>>>>>> 02/25/2016   DM2 (diabetes mellitus, type 2) (HCC) 07/06/2013   B12 deficiency 07/06/2013   CAROTID BRUIT, RIGHT 10/01/2010   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   BPH (benign prostatic hyperplasia)  08/26/2007   Hyperlipidemia 10/02/2006   Essential hypertension 10/02/2006    ONSET DATE: 05/05/23  REFERRING DIAG: Brain Tumor, S/P Resection  THERAPY DIAG:  Other lack of coordination  Other symptoms and signs involving the nervous system  Rationale for Evaluation and Treatment: Rehabilitation  SUBJECTIVE:   SUBJECTIVE STATEMENT: "I had a seizure Saturday night" Pt accompanied by: self and significant other  PERTINENT HISTORY: The patient was admitted to [facility] on 05/15/2023 and discharged on 05/23/2023 to Inpatient rehab and spent 18 days A. 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  PRECAUTIONS: Fall  WEIGHT BEARING RESTRICTIONS: No  PAIN:  Are you having pain? No  FALLS: Has patient fallen in last 6 months? Yes. Number of falls "multiple"  LIVING ENVIRONMENT: Lives with: lives with their family Lives in: House/apartment Stairs: Yes: Internal: 13 steps; can reach both and External: 3 steps; none Has following equipment at home: Single point cane, Walker - 2 wheeled, shower chair, bed side commode, and Grab bars  PLOF: Independent  PATIENT GOALS: "I'd like to get back to where I was at"  OBJECTIVE:  Note: Objective measures were completed at Evaluation unless otherwise noted.  HAND DOMINANCE: Right  ADLs: Overall ADLs: Pt reports difficulty with lifting and carrying heavy items, specifically, yard work, cleaning, and cooking.   MOBILITY STATUS: Hx of falls  POSTURE COMMENTS:  rounded shoulders Sitting balance: Moves/returns  truncal midpoint 1-2 inches in multiple planes  ACTIVITY TOLERANCE: Activity tolerance: Mildly limited  FUNCTIONAL OUTCOME MEASURES: FOTO: 57.89/100  UPPER EXTREMITY ROM:    All Motions WFL  UPPER EXTREMITY MMT:     MMT Right eval Left eval  Shoulder flexion 4/5 4-/5  Shoulder abduction 5/5 4+/5  Shoulder adduction 5/5 5/5  Shoulder extension 4+/5  4+/5  Shoulder internal rotation 4+/5 4/5  Shoulder external rotation 4+/5 4/5  Elbow flexion 5/5 4+/5  Elbow extension 5/5 4/5  Wrist flexion 5/5 4+/5  Wrist extension 5/5 4+/5  Wrist ulnar deviation 5/5 5/5  Wrist radial deviation 5/5 5/5  Wrist pronation 5/5 5/5  Wrist supination 5/5 5/5  (Blank rows = not tested)  HAND FUNCTION: Grip strength: Right: 68 lbs; Left: 64 lbs, Lateral pinch: Right: 19 lbs, Left: 18 lbs, and 3 point pinch: Right: 15 lbs, Left: 12 lbs  COORDINATION: 9 Hole Peg test: Right: 30.87 sec; Left: 34.80 sec  SENSATION: WFL  EDEMA: No swelling noted  OBSERVATIONS: Mildly slowed movements in the hands   TODAY'S TREATMENT:                                                                                                                              DATE:   07/14/23 -Theraball Exercises: flexion, protraction, overhead press, V ups, circles both directions, x12 -Theraputty: red putty, put 12 tiny pegs in putty, roll into a ball, push, pinch and pull to find all tiny pegs -Tiny Peg Board: using tweezers completing pattern provided  07/07/23 -Shoulder Strengthening: 2lb dumbbell,  flexion, abduction, protraction, horizontal abduction, er/IR, x10 -Strengthening: 4lb, hammer curl, bicep curl, x10 -Wrist Strengthening: 3lb dumbbell, extension, flexion, ulnar/radial deviation, supination/pronation, x10 -Building a table: min difficulty following complete instructions, good bilateral manual skills.   06/30/23 -Scapular Strengthening: green band, extension, retraction, rows, x15 -Shoulder Strengthening: green band, flexion, abduction, horizontal abduction, er/IR, x15 -Theraputty: red putty, roll into a ball, flatten into a pancake, roll into log, tripod pinch x10, lateral pinch x10, roll into a ball, squeeze x10 -Wrist Strengthening: 2lb dumbbell, extension, flexion, ulnar/radial deviation, supination/pronation, x10 -Digit ROM: composite flexion,  abduction/adduction, finger taps, opposition, x10   PATIENT EDUCATION: Education details: continue HEP Person educated: Patient Education method: Explanation Education comprehension: verbalized understanding  HOME EXERCISE PROGRAM: 10/16: Shoulder strengthening 10/22: Scapular Strengthening 10/29: Wrist Strengthening    GOALS: Goals reviewed with patient? Yes  SHORT TERM GOALS: Target date: 07/31/23  Pt will be provided comprehensive HEP for BUE strengthening in order to complete function tasks independently.   Goal status: IN PROGRESS  2.  Pt will improve BUE strength in order to complete cooking and cleaning at home independently.   Goal status: IN PROGRESS  3.  Pt will increased BUE grip by 10lbs and pinch by 2lbs in order to maintain grasp during yard work tasks.   Goal status: IN PROGRESS  4.  Pt will increase BUE coordination to 27 seconds  or quicker in order to improve effectiveness of manipulating buttons, zippers, and clasps.   Goal status: IN PROGRESS   ASSESSMENT:  CLINICAL IMPRESSION: This session, pt presented with increased fatigue, most likely due to recent seizure with post-ictal state. He required increased time with theraball exercises and theraputty. Pt then had increased difficulty manipulating tweezers to place tiny pegs without use of BUE. OT providing verbal and tactile cuing for positioning and technique.   PERFORMANCE DEFICITS: in functional skills including ADLs, IADLs, coordination, ROM, strength, Fine motor control, Gross motor control, body mechanics, and UE functional use.    PLAN:  OT FREQUENCY: 1-2x/week  OT DURATION: 6 weeks  PLANNED INTERVENTIONS: self care/ADL training, therapeutic exercise, therapeutic activity, neuromuscular re-education, manual therapy, passive range of motion, functional mobility training, electrical stimulation, ultrasound, paraffin, moist heat, patient/family education, coping strategies training, and DME  and/or AE instructions  RECOMMENDED OTHER SERVICES: N/A  CONSULTED AND AGREED WITH PLAN OF CARE: Patient  PLAN FOR NEXT SESSION: Manual therapy as needed, shoulder strengthening, wrist strengthening, grip and pinch strengthening, stabilization.   Trish Mage, OTR/L [facility] [phone] Kennyth Arnold, OT 07/15/2023, 8:00 PM

## 2023-07-15 ENCOUNTER — Ambulatory Visit
Admission: RE | Admit: 2023-07-15 | Discharge: 2023-07-15 | Disposition: A | Discharge status: Home / Self Care | Payer: Medicare Other | Source: Ambulatory Visit | Attending: Radiation Oncology | Admitting: Radiation Oncology

## 2023-07-15 ENCOUNTER — Other Ambulatory Visit: Payer: Self-pay

## 2023-07-15 ENCOUNTER — Encounter: Payer: Self-pay | Admitting: Internal Medicine

## 2023-07-15 ENCOUNTER — Ambulatory Visit (HOSPITAL_COMMUNITY): Payer: Medicare Other

## 2023-07-15 DIAGNOSIS — R29818 Other symptoms and signs involving the nervous system: Secondary | ICD-10-CM | POA: Diagnosis not present

## 2023-07-15 DIAGNOSIS — R262 Difficulty in walking, not elsewhere classified: Secondary | ICD-10-CM | POA: Diagnosis not present

## 2023-07-15 DIAGNOSIS — R29898 Other symptoms and signs involving the musculoskeletal system: Secondary | ICD-10-CM

## 2023-07-15 DIAGNOSIS — R278 Other lack of coordination: Secondary | ICD-10-CM

## 2023-07-15 DIAGNOSIS — C711 Malignant neoplasm of frontal lobe: Secondary | ICD-10-CM | POA: Diagnosis not present

## 2023-07-15 DIAGNOSIS — R41841 Cognitive communication deficit: Secondary | ICD-10-CM | POA: Diagnosis not present

## 2023-07-15 DIAGNOSIS — Z510 Encounter for antineoplastic radiation therapy: Secondary | ICD-10-CM | POA: Diagnosis not present

## 2023-07-15 LAB — RAD ONC ARIA SESSION SUMMARY
Course Elapsed Days: 27
Plan Fractions Treated to Date: 19
Plan Prescribed Dose Per Fraction: 2 Gy
Plan Total Fractions Prescribed: 23
Plan Total Prescribed Dose: 46 Gy
Reference Point Dosage Given to Date: 38 Gy
Reference Point Session Dosage Given: 2 Gy
Session Number: 19

## 2023-07-15 NOTE — Progress Notes (Signed)
Patient informed radiation therapists this morning that he will be required to wear a cardiac monitoring device starting tomorrow. Reviewed patient's chart and found that Dr. Ladona Ridgel ordered a Zio patch device. Reached out to physicist Caesar Bookman to see if device would be impacted by remaining radiation treatments to patient's frontal lobe.   Per Weston Brass: "we just advise that there is a risk of monitoring data loss or corruption.  Given that the treatment energy (6x) will not generate neutrons, which is the biggest factor in device malfunction, and that the device will stay well outside the treatment field, I would estimate the risk to the device as low.  There is no risk to the patient."  Notice of potential device form faxed to Dr. Reece Leader office [phone]). Received confirmation notice that fax went through successfully.

## 2023-07-15 NOTE — Therapy (Signed)
OUTPATIENT PHYSICAL THERAPY NEURO Treatment    Patient Name: Alex Church MRN: [id number] DOB:April 03, 1946, 77 y.o., male Today's Date: 07/15/2023   PCP: Wanda Plump, MD REFERRING PROVIDER: Jacquelynn Cree, PA-C  END OF SESSION:  PT End of Session - 07/15/23 1147     Visit Number 8    Number of Visits 15    Date for PT Re-Evaluation 08/06/23    Authorization Type [employer] Medicare    Progress Note Due on Visit 18    PT Start Time 1145    PT Stop Time 1225    PT Time Calculation (min) 40 min    Activity Tolerance Patient tolerated treatment well    Behavior During Therapy WFL for tasks assessed/performed                 Past Medical History:  Diagnosis Date   B12 deficiency    monthly shots   Diabetes mellitus    Glaucoma suspect    Glioblastoma (HCC)    Hyperlipemia    Hypertension    Past Surgical History:  Procedure Laterality Date   APPLICATION OF CRANIAL NAVIGATION Right 05/12/2023   Procedure: APPLICATION OF CRANIAL NAVIGATION;  Surgeon: Bethann Goo, DO;  Location: [facility] OR;  Service: Neurosurgery;  Laterality: Right;   CRANIOTOMY Right 05/12/2023   Procedure: RIGHT STERIOSTACTIC FRONTAL CRANIOTOMY FOR TUMOR RESECTION;  Surgeon: Bethann Goo, DO;  Location: [facility] OR;  Service: Neurosurgery;  Laterality: Right;   Patient Active Problem List   Diagnosis Date Noted   Seizure (HCC) 07/12/2023   Hypomagnesemia 07/12/2023   Myocardial injury 07/12/2023   Syncope, ruled out 07/11/2023   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   Glioblastoma, 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   Left-sided weakness 05/06/2023   PVC (premature ventricular contraction) 08/27/2022   Glaucoma suspect 04/30/2020   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   Annual physical exam 06/30/2016   PCP NOTES >>>>>>>>>>>>>>>>>>>>> 02/25/2016   DM2 (diabetes mellitus, type 2) (HCC) 07/06/2013   B12 deficiency 07/06/2013   CAROTID BRUIT, RIGHT 10/01/2010   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   BPH  (benign prostatic hyperplasia) 08/26/2007   Hyperlipidemia 10/02/2006   Essential hypertension 10/02/2006    ONSET DATE: S/P brain tumor resection 05/12/23  REFERRING DIAG: D49.6 (ICD-10-CM) - Neoplasm of unspecified behavior of brain  THERAPY DIAG:  Difficulty walking  Weakness of left lower extremity  Other symptoms and signs involving the nervous system  Other lack of coordination  Rationale for Evaluation and Treatment: Rehabilitation  SUBJECTIVE:  SUBJECTIVE STATEMENT: Patient states he thinks he had a seizure Saturday and had to go to ED; he is supposed to be getting a heart monitor today because one MD thought maybe his heart was the issue; still had radiation treatment this morning; started on an anti-seizure medication Keppra per hospitalist;   PERTINENT HISTORY: PVC  PAIN:  Are you having pain? No  PRECAUTIONS: Fall  RED FLAGS: Bowel or bladder incontinence: Yes: recently diagnosed with UTI    WEIGHT BEARING RESTRICTIONS: No  FALLS: Has patient fallen in last 6 months? Yes. Number of falls 2 (most recent was in August 2024)  LIVING ENVIRONMENT: Lives with: lives with their spouse Lives in: House/apartment Stairs: Yes: Internal: 12-13 steps; can reach both and External: 5 steps; on right going up Has following equipment at home: Single point cane, Walker - 2 wheeled, and Wheelchair (manual)  PLOF: Independent and Independent with basic ADLs  PATIENT GOALS: Wife wishes for the patient's legs to be strong again  OBJECTIVE:  Note: Objective measures were completed at Evaluation unless otherwise noted.  DIAGNOSTIC FINDINGS:  05/13/23 MRI HEAD WITHOUT AND WITH CONTRAST   TECHNIQUE: Multiplanar, multiecho pulse sequences of the brain and surrounding structures were obtained  without and with intravenous contrast.   CONTRAST:  8mL GADAVIST GADOBUTROL 1 MMOL/ML IV SOLN   COMPARISON:  MRI head 05/05/2023.   FINDINGS: Brain: Interval debulking of a right frontal lesion appears larger section cavity with expected postoperative blood products in the resection cavity as well as overlying extra-axial spaces. Small to moderate volume of pneumocephalus antidependently along the frontal convexities anteriorly. Surrounding T2/FLAIR hyperintensities is mildly improved and mass effect is mildly improved. Leftward midline shift now measures approximately 4 mm anteriorly. No suspicious residual enhancement, although postoperative left limits assessment. Mm continued attention on follow-up.   No evidence of acute infarct or hydrocephalus.   Vascular: Major arterial flow voids are maintained at the skull base.   Skull and upper cervical spine: Normal marrow signal.   Sinuses/Orbits: Clear sinuses.  No acute orbital findings.   Other: No mastoid effusions.   IMPRESSION: Expected postoperative findings after debulking of a large right frontal mass, detailed above.MRI HEAD WITHOUT AND WITH CONTRAST   TECHNIQUE: Multiplanar, multiecho pulse sequences of the brain and surrounding structures were obtained without and with intravenous contrast.   CONTRAST:  8mL GADAVIST GADOBUTROL 1 MMOL/ML IV SOLN   COMPARISON:  MRI head 05/05/2023.   FINDINGS: Brain: Interval debulking of a right frontal lesion appears larger section cavity with expected postoperative blood products in the resection cavity as well as overlying extra-axial spaces. Small to moderate volume of pneumocephalus antidependently along the frontal convexities anteriorly. Surrounding T2/FLAIR hyperintensities is mildly improved and mass effect is mildly improved. Leftward midline shift now measures approximately 4 mm anteriorly. No suspicious residual enhancement, although postoperative left limits  assessment. Mm continued attention on follow-up.   No evidence of acute infarct or hydrocephalus.   Vascular: Major arterial flow voids are maintained at the skull base.   Skull and upper cervical spine: Normal marrow signal.   Sinuses/Orbits: Clear sinuses.  No acute orbital findings.   Other: No mastoid effusions.   IMPRESSION: Expected postoperative findings after debulking of a large right frontal mass, detailed above.  05/05/23 MRI HEAD WITHOUT AND WITH CONTRAST   TECHNIQUE: Multiplanar, multiecho pulse sequences of the brain and surrounding structures were obtained without and with intravenous contrast.   CONTRAST:  9mL GADAVIST GADOBUTROL 1 MMOL/ML IV SOLN  COMPARISON:  05/05/2023 CTA head neck   FINDINGS: Brain: There is a large, predominantly peripherally contrast-enhancing mass located within the right frontal lobe, measuring 6.8 x 4.9 cm. There is a large amount of surrounding hyperintense T2-weighted signal. There is marked mass effect on the right lateral ventricle and leftward midline shift 6 mm. There is early subfalcine herniation of the cingulate gyrus. No acute hemorrhage. The central necrotic component of the mass does not restrict diffusion.   Vascular: Major flow voids are preserved.   Skull and upper cervical spine: Normal calvarium and skull base. Visualized upper cervical spine and soft tissues are normal.   Sinuses/Orbits:No paranasal sinus fluid levels or advanced mucosal thickening. No mastoid or middle ear effusion. Normal orbits.   IMPRESSION: 1. Large, predominantly peripherally contrast-enhancing mass located within the right frontal lobe, measuring 6.8 x 4.9 cm. Large amount of surrounding hyperintense T2-weighted signal. This is most consistent with a high-grade glioma. 2. Marked mass effect on the right lateral ventricle and 6 mm leftward midline shift. Early subfalcine herniation of the cingulate gyrus.  COGNITION: Overall  cognitive status: Within functional limits for tasks assessed   SENSATION: WFL  COORDINATION: Heel to shin: intact and WFL on B Alternating foot taps: intact and WFL  MUSCLE TONE: normal  MUSCLE LENGTH: Hamstrings: moderate restriction on B Gastrocsoleus: moderate restriction on B  POSTURE: Standing: rounded shoulders, forward head, and decreased lumbar lordosis  LOWER EXTREMITY ROM:     Active  Right Eval Left Eval  Hip flexion [facility]  Hip extension [facility]  Hip abduction [facility]  Hip adduction    Hip internal rotation    Hip external rotation    Knee flexion [facility]  Knee extension [facility]  Ankle dorsiflexion [facility]  Ankle plantarflexion [facility] WFL  Ankle inversion    Ankle eversion     (Blank rows = not tested)  LOWER EXTREMITY MMT:    MMT Right Eval Left Eval Right 07/09/23 Left 07/09/23  Hip flexion 3+ 3+ 4 4-  Hip extension 2+ 2+ 3- 3-  Hip abduction 4- 3+ 4 4-  Hip adduction      Hip internal rotation      Hip external rotation      Knee flexion 4+ 4+ 5 4+  Knee extension 4+ 3+ 4+ 4+  Ankle dorsiflexion 4+ 4+ 4+ 4+  Ankle plantarflexion 4+ 4+ 4+ 4+  Ankle inversion      Ankle eversion      (Blank rows = not tested)  BED MOBILITY:  Sit to supine Complete Independence Supine to sit Complete Independence Rolling to Right Complete Independence Rolling to Left Complete Independence  TRANSFERS: Assistive device utilized: Single point cane  Sit to stand: Modified independence Stand to sit: Modified independence  STAIRS: Level of Assistance: Modified independence Stair Negotiation Technique: Alternating Pattern  with Bilateral Rails Number of Stairs: 4  Height of Stairs: 7"  Comments: Little to no unsteadiness noted  GAIT: Gait pattern: decreased hip/knee flexion- Right, decreased hip/knee flexion- Left, and trendelenburg Distance walked: 308 ft Assistive device utilized: Single point cane Level of assistance: Modified  independence Comments: done during  FUNCTIONAL TESTS:  5 times sit to stand: 18.30 sec 2 minute walk test: 308 ft Dynamic Gait Index: 15 Tinetti (balance + gait) = 19   DGI 1. Gait level surface (2) Mild Impairment: Walks 20', uses assistive devices, slower speed, mild gait deviations. 2. Change in gait speed (3) Normal: Able to smoothly  change walking speed without loss of balance or gait deviation. Shows a significant difference in walking speeds between normal, fast and slow speeds. 3. Gait with horizontal head turns (1) Moderate Impairment: Performs head turns with moderate change in gait velocity, slows down, staggers but recovers, can continue to walk. 4. Dellia Nims with vertical head turns  1) Moderate Impairment: Performs head turns with moderate change in gait velocity, slows down, staggers but recovers, can continue to walk. 5. Gait and pivot turn (2) Mild Impairment: Pivot turns safely in > 3 seconds and stops with no loss of balance. 6. Step over obstacle (2) Mild Impairment: Is able to step over box, but must slow down and adjust steps to clear box safely. 7. Step around obstacles (2) Mild Impairment: Is able to step around both cones, but must slow down and adjust steps to clear cones. 8. Stairs (2) Mild Impairment: Alternating feet, must use rail.  TOTAL SCORE: 15 / 24   PATIENT SURVEYS:  LEFS 51/80 = 63.7% 07/08/20: LEFS 65/80= 81.2%  TODAY'S TREATMENT:                                                                                                                              DATE:  07/15/23 Nustep L5 seat 10x 5' UE/LE Standing: Heel raises x 20 Tandem stance 2 x 30" each 4" step ups no UE assist 3# marching with 1 UE assist 2 x 10  Seated: 3# LAQs 2 x 10 each      07/09/23: 5 times sit to stand: 18.30 sec; 07/09/23: 14.88" 2 minute walk test: eval: 308 ft; 07/09/23: 468 ft Dynamic Gait Index: 15; 07/09/23:  23/24 Tinetti (balance + gait) = 19  07/09/23: 28/28 LEFS 65/80=  DGI 1. Gait level surface (3) Normal: Walks 20', no assistive devices, good sped, no evidence for imbalance, normal gait pattern 2. Change in gait speed (3) Normal: Able to smoothly change walking speed without loss of balance or gait deviation. Shows a significant difference in walking speeds between normal, fast and slow speeds. 3. Gait with horizontal head turns (3) Normal: Performs head turns smoothly with no change in gait. 4. Gait with vertical head turns (3) Normal: Performs head turns smoothly with no change in gait. 5. Gait and pivot turn (3) Normal: Pivot turns safely within 3 seconds and stops quickly with no loss of balance. 6. Step over obstacle (3) Normal: Is able to step over the box without changing gait speed, no evidence of imbalance. 7. Step around obstacles (3) Normal: Is able to walk around cones safely without changing gait speed; no evidence of imbalance. 8. Stairs (2) Mild Impairment: Alternating feet, must use rail.  TOTAL SCORE: 23 / 24  MMT see above  07/03/23: Standing: Sit to stand slow control then power up to heel raise no HHA 15x Functional squat 12 reps SLS Lt 4", Rt 2" max of 3 Vector stance 3x 5" with 1 HHA Tandem  stance 1x 30" on solid floor; 2x 30" on foam Sidestep GTB 5RT inside // bars minimal HHA Stairs 1 HR 3RT 7in step height Nustep L5 seat 10x 5' UE/LE   06/30/23 Nustep level 4 seat 10;  5 minutes UE/LE Standing: Heelraises on incline x 20 light UE assist Toe raises on decline x20 light UE assist Lunges onto 4" no UE assist 2X10 each 6" forward step ups 20X each LE 6" lateral step ups 20X each LE Vectors 5X 3" each with 1 UE assist 3# hip abduction 2 x 10 3# hip extension 2 x 10 3# marching 2 x 10 Sit to stand 2 x 5 no UE assist  06/25/23 Nustep level 4 seat 12;  5 minutes Standing: Heelraises on incline x 20 light UE assist Toe raises on decline x 10 light UE assist Tandem stance 2 x 30" sec  no UE assist  Lunges onto 4" no UE assist 2X10 each Vectors 5X 3" each with 1 UE assist SLS x 20" each occassional UE assist 2# hip abduction 2 x 10 2# hip extension 2 x 10 2# marching 2 x 10 Sit to stand 2 x 5 no UE assist  06/23/23 Standing: Heels raises on incline x 20 light UE assist Toe raises on decline x 10 light UE assist Sit to stand 2 x 5 no UE assist Tandem stance 2 x 30" sec no UE assist SLS x 20" each occassional UE assist 2# hip abduction 2 x 10 2# hip extension 2 x 10 2# marching 2 x 10   06/15/23 Sit to stand x 10 Standing: March x 10  Squat x 10 Tandem stance x 5 with Rt in front x 5 with left in front.   Supine bridge x 10 SLR x 10 Knee to chest 30 x 3"  Side lying  hip abduction x 10 B 06/10/23 Evaluation and patient education    PATIENT EDUCATION: Education details: Educated on the goals and course of rehab. Education on measures to reduce falls at home. Person educated: Patient and Spouse Education method: Explanation Education comprehension: verbal cues required  HOME EXERCISE PROGRAM: 06/15/23 Access Code: [email or web address] URL: [email or web address] Date: 06/15/2023 Prepared by: Virgina Organ  Exercises - Supine Bridge  - 1 x daily - 7 x weekly - 1 sets - 10 reps - 5" hold - Heel Raises with Counter Support  - 1 x daily - 7 x weekly - 1 sets - 10 reps - 5" hold - Standing March with Counter Support  - 1 x daily - 7 x weekly - 1 sets - 10 reps - 5" hold - Mini Squat with Counter Support  - 1 x daily - 7 x weekly - 1 sets - 10 reps - 5" hold - Sit to Stand Without Arm Support  - 1 x daily - 7 x weekly - 1 sets - 10 reps  07/03/23: - Single Leg Stance  - 2 x daily - 7 x weekly - 2 sets - 3 reps - 30" hold - Standing 3-Way Kick  - 2 x daily - 7 x weekly - 1 sets - 5 reps - 5" hold - Side Stepping with Resistance at Thighs  - 1 x daily - 7 x weekly - 1 sets - 5 reps  GOALS: Goals reviewed with patient? Yes  SHORT TERM GOALS: Target  date: 06/24/23  Pt will demonstrate indep in HEP to facilitate carry-over of skilled services and improve functional outcomes Goal status: on-going; MET  LONG TERM GOALS: Target date: 07/08/23  Pt will decrease 5TSTS by at least 3 seconds in order to demonstrate clinically significant improvement in LE strength  Baseline: 18.30 sec: 07/09/23:  14.88" no HHA Goal status: MET  2.  Pt will improve DGI by at least 3 points in order to demonstrate clinically significant improvement in balance and decreased risk for falls  Baseline: 15; 07/09/23:  23/24 Goal status: MET  3.  Patient will demonstrate increase in Tinetti Score by 3 points in order to demonstrate clinically significant improvement in balance and decreased risk for falls Baseline: 19; 07/09/23:  28/28 Goal status: MET  4.  Pt will increase by at least 40 ft in order to demonstrate clinically significant improvement in community ambulation Baseline: 308 ft: 07/09/23:  486 Goal status: MET  5.  Pt will increase LEFS by at least 9 points in order to demonstrate significant improvement in lower extremity function.  Baseline: 51; 07/09/23: 65/80 Goal status: MET  6.  Pt will demonstrate increase in LE strength to 4+/5 to facilitate ease and safety in ambulation Baseline: 2+/5; 07/09/23:  see above Goal status: on-going  ASSESSMENT:  CLINICAL IMPRESSION: Patient had a seizure over the weekend; is awaiting a heart monitor;  also reports fatigue and initially walking in has a couple steps out of pathway but recovers without issue; continued with focus on strength and balance; patient without any loss of balance during treatment but demonstrates some difficulty with steps ups without use of hands; tends to flex at the trunk as he fatigues PT will continue to benefit from skilled PT to address his functional impairments and limitations.   Reviewed goals and pt is progressing well.  Pt has met 1/1 STG and 5/6 LTGs.  Reports of  compliance and walking program.  Presents with improvements with objective findings including:  Increased cadence with , improved gait mechanics and balance noted with Tinetti testing and improved self perceived functional abilities wth LEFS score improvements.  Pt continues to present with weakness especially in hip musculature, see MMT.  PT will continue to benefit from skilled PT to address his functional impairments and limitations.   OBJECTIVE IMPAIRMENTS: Abnormal gait, decreased activity tolerance, decreased balance, difficulty walking, decreased strength, and impaired flexibility.   ACTIVITY LIMITATIONS: carrying, lifting, bending, standing, squatting, stairs, and transfers  PARTICIPATION LIMITATIONS: meal prep, cleaning, laundry, driving, shopping, community activity, and yard work  PERSONAL FACTORS: Age are also affecting patient's functional outcome.   REHAB POTENTIAL: Good  CLINICAL DECISION MAKING: Stable/uncomplicated  EVALUATION COMPLEXITY: Low  PLAN:  PT FREQUENCY: 1x/week  PT DURATION: 4 weeks  PLANNED INTERVENTIONS: Therapeutic exercises, Therapeutic activity, Neuromuscular re-education, Balance training, Gait training, Patient/Family education, Self Care, Stair training, and Manual therapy  PLAN FOR NEXT SESSION: Progress LE flexibility, balance, gait and strengthening activities.   12:28 PM, 07/15/23 Kymoni Monday Small Tarsha Blando MPT [facility] [place] [phone]

## 2023-07-16 ENCOUNTER — Ambulatory Visit
Admission: RE | Admit: 2023-07-16 | Discharge: 2023-07-16 | Disposition: A | Discharge status: Home / Self Care | Payer: Medicare Other | Source: Ambulatory Visit | Attending: Radiation Oncology | Admitting: Radiation Oncology

## 2023-07-16 ENCOUNTER — Other Ambulatory Visit: Payer: Self-pay

## 2023-07-16 ENCOUNTER — Inpatient Hospital Stay: Payer: Medicare Other

## 2023-07-16 ENCOUNTER — Inpatient Hospital Stay: Payer: Medicare Other | Attending: Internal Medicine | Admitting: Internal Medicine

## 2023-07-16 VITALS — BP 123/69 | HR 77 | Temp 98.4°F | Resp 18 | Wt 198.2 lb

## 2023-07-16 DIAGNOSIS — C719 Malignant neoplasm of brain, unspecified: Secondary | ICD-10-CM

## 2023-07-16 DIAGNOSIS — C711 Malignant neoplasm of frontal lobe: Secondary | ICD-10-CM | POA: Insufficient documentation

## 2023-07-16 DIAGNOSIS — Z79899 Other long term (current) drug therapy: Secondary | ICD-10-CM | POA: Insufficient documentation

## 2023-07-16 DIAGNOSIS — I5020 Unspecified systolic (congestive) heart failure: Secondary | ICD-10-CM | POA: Diagnosis not present

## 2023-07-16 DIAGNOSIS — R569 Unspecified convulsions: Secondary | ICD-10-CM | POA: Insufficient documentation

## 2023-07-16 DIAGNOSIS — R404 Transient alteration of awareness: Secondary | ICD-10-CM | POA: Diagnosis not present

## 2023-07-16 DIAGNOSIS — Z510 Encounter for antineoplastic radiation therapy: Secondary | ICD-10-CM | POA: Diagnosis not present

## 2023-07-16 LAB — CBC WITH DIFFERENTIAL (CANCER CENTER ONLY)
Abs Immature Granulocytes: 0.03 10*3/uL (ref 0.00–0.07)
Basophils Absolute: 0.1 10*3/uL (ref 0.0–0.1)
Basophils Relative: 1 %
Eosinophils Absolute: 0.2 10*3/uL (ref 0.0–0.5)
Eosinophils Relative: 3 %
HCT: 34.6 % — ABNORMAL LOW (ref 39.0–52.0)
Hemoglobin: 10.8 g/dL — ABNORMAL LOW (ref 13.0–17.0)
Immature Granulocytes: 1 %
Lymphocytes Relative: 16 %
Lymphs Abs: 1.1 10*3/uL (ref 0.7–4.0)
MCH: 30.6 pg (ref 26.0–34.0)
MCHC: 31.2 g/dL (ref 30.0–36.0)
MCV: 98 fL (ref 80.0–100.0)
Monocytes Absolute: 0.6 10*3/uL (ref 0.1–1.0)
Monocytes Relative: 9 %
Neutro Abs: 4.5 10*3/uL (ref 1.7–7.7)
Neutrophils Relative %: 70 %
Platelet Count: 158 10*3/uL (ref 150–400)
RBC: 3.53 MIL/uL — ABNORMAL LOW (ref 4.22–5.81)
RDW: 16.4 % — ABNORMAL HIGH (ref 11.5–15.5)
WBC Count: 6.4 10*3/uL (ref 4.0–10.5)
nRBC: 0 % (ref 0.0–0.2)

## 2023-07-16 LAB — CMP (CANCER CENTER ONLY)
ALT: 11 U/L (ref 0–44)
AST: 16 U/L (ref 15–41)
Albumin: 3.5 g/dL (ref 3.5–5.0)
Alkaline Phosphatase: 63 U/L (ref 38–126)
Anion gap: 6 (ref 5–15)
BUN: 12 mg/dL (ref 8–23)
CO2: 31 mmol/L (ref 22–32)
Calcium: 9.1 mg/dL (ref 8.9–10.3)
Chloride: 103 mmol/L (ref 98–111)
Creatinine: 1 mg/dL (ref 0.61–1.24)
GFR, Estimated: >60 mL/min (ref >60)
Glucose, Bld: 177 mg/dL — ABNORMAL HIGH (ref 70–99)
Potassium: 4.1 mmol/L (ref 3.5–5.1)
Sodium: 140 mmol/L (ref 135–145)
Total Bilirubin: 0.4 mg/dL (ref <1.2)
Total Protein: 5.9 g/dL — ABNORMAL LOW (ref 6.5–8.1)

## 2023-07-16 LAB — RAD ONC ARIA SESSION SUMMARY
Course Elapsed Days: 28
Plan Fractions Treated to Date: 20
Plan Prescribed Dose Per Fraction: 2 Gy
Plan Total Fractions Prescribed: 23
Plan Total Prescribed Dose: 46 Gy
Reference Point Dosage Given to Date: 40 Gy
Reference Point Session Dosage Given: 2 Gy
Session Number: 20

## 2023-07-16 NOTE — Progress Notes (Signed)
[facility] [address]  [place], [place] [address] [phone]   Interval Evaluation  Date of Service: 07/16/23 Patient Name: Alex Church Patient MRN: [id number] Patient DOB: [birth year] Provider: Henreitta Leber, MD  Identifying Statement:  Alex Church is a 77 y.o. male with right frontal glioblastoma   Oncologic History: Oncology History  Glioblastoma, IDH-wildtype (HCC)  05/12/2023 Surgery   Right frontal craniotomy, resection with Dr. Jake Samples; path is glioblastoma IDH-wt   06/09/2023 -  Chemotherapy   Patient is on Treatment Plan : BRAIN GLIOBLASTOMA Radiation Therapy With Concurrent Temozolomide 75 mg/m2 Daily Followed By Sequential Maintenance Temozolomide x 6-12 cycles          Interval History: Alex Church presents today for follow up, now having completed 4 weeks of radiation and Temodar.  He did experience a seizure this past week, with brief hospitalization.  It was characterized by "found down" at home minimally responsive; returned to baseline after 1-2 days.  Was started on Keppra and is doing well with that.  No issues with RT or Temodar.  Remains active, independent.     H+P (06/02/23) Patient presented to neurologic attention in late August with several days history of left sided weakness, confusion.  Wife noticed he had difficulty getting out of bed, was dragging left side.  This was on top of several months history of progressive confusion, short term memory impairment.  CNS imaging demonstrated a large right frontal mass.  He underwent craniotomy and resection with Dr. Jake Samples, path demonstrated glioblastoma.  Since surgery he has been doing well, walking independently.  Denies seizures, headaches.  He is off decadron.   Medications: Current Outpatient Medications on File Prior to Visit  Medication Sig Dispense Refill   acetaminophen (TYLENOL) 500 MG tablet Take 1,000 mg by mouth every 6 (six) hours as needed for  mild pain (pain score 1-3) (Tries to only take twice a week).     allopurinol (ZYLOPRIM) 100 MG tablet Take 1 tablet (100 mg total) by mouth 2 (two) times daily. 180 tablet 1   atorvastatin (LIPITOR) 10 MG tablet Take 1 tablet (10 mg total) by mouth at bedtime. 90 tablet 1   Blood Glucose Monitoring Suppl (ONETOUCH VERIO FLEX SYSTEM) w/Device KIT Check blood sugar once daily 1 kit 0   carvedilol (COREG) 12.5 MG tablet Take 0.5 tablets (6.25 mg total) by mouth 2 (two) times daily with a meal. Due for appt 05/2023     cloNIDine (CATAPRES) 0.1 MG tablet Take 1 tablet (0.1 mg total) by mouth 2 (two) times daily. 180 tablet 1   Cyanocobalamin (VITAMIN B-12) 1000 MCG/15ML LIQD Take 1,000 mcg by mouth daily.     glucose blood test strip Check blood sugars once daily 100 each 12   ibuprofen (ADVIL) 200 MG tablet Take 600 mg by mouth every 6 (six) hours as needed for mild pain (pain score 1-3).     Lancets (ONETOUCH ULTRASOFT) lancets Check bloods sugars once daily 100 each 12   levETIRAcetam (KEPPRA) 500 MG tablet Take 1 tablet (500 mg total) by mouth 2 (two) times daily. 60 tablet 0   lisinopril (ZESTRIL) 20 MG tablet Take 20 mg by mouth at bedtime.     melatonin 5 MG TABS Take 1 tablet (5 mg total) by mouth at bedtime. (Patient taking differently: Take 20 mg by mouth at bedtime.) 30 tablet 0   metFORMIN (GLUCOPHAGE) 1000 MG tablet Take 0.5 tablets (500 mg  total) by mouth 2 (two) times daily with a meal.     ondansetron (ZOFRAN) 8 MG tablet Take 1 tablet (8 mg total) by mouth every 8 (eight) hours as needed for nausea or vomiting. May take 30-60 minutes prior to Temodar administration if nausea/vomiting occurs as needed. (Patient taking differently: Take 8 mg by mouth at bedtime. Take every evening before taking chemotherapy.) 30 tablet 1   pioglitazone (ACTOS) 30 MG tablet Take 1 tablet (30 mg total) by mouth daily.     polyethylene glycol (MIRALAX / GLYCOLAX) 17 g packet Take 17 g by mouth daily as needed  for mild constipation. 14 each 0   sitaGLIPtin (JANUVIA) 100 MG tablet Take 1 tablet (100 mg total) by mouth daily. (Patient taking differently: Take 100 mg by mouth daily after supper.)     sodium chloride 1 g tablet Take 1 tablet (1 g total) by mouth 3 (three) times daily with meals. 90 tablet 0   tamsulosin (FLOMAX) 0.4 MG CAPS capsule Take 1 capsule (0.4 mg total) by mouth daily after supper. 90 capsule 0   temozolomide (TEMODAR) 140 MG capsule Take 1 capsule (140 mg total) by mouth at bedtime. May take on an empty stomach to decrease nausea & vomiting. 42 capsule 0   No current facility-administered medications on file prior to visit.    Allergies: No Known Allergies Past Medical History:  Past Medical History:  Diagnosis Date   B12 deficiency    monthly shots   Diabetes mellitus    Glaucoma suspect    Glioblastoma (HCC)    Hyperlipemia    Hypertension    Past Surgical History:  Past Surgical History:  Procedure Laterality Date   APPLICATION OF CRANIAL NAVIGATION Right 05/12/2023   Procedure: APPLICATION OF CRANIAL NAVIGATION;  Surgeon: Dawley, Alan Mulder, DO;  Location: [facility] OR;  Service: Neurosurgery;  Laterality: Right;   CRANIOTOMY Right 05/12/2023   Procedure: RIGHT STERIOSTACTIC FRONTAL CRANIOTOMY FOR TUMOR RESECTION;  Surgeon: Bethann Goo, DO;  Location: [facility] OR;  Service: Neurosurgery;  Laterality: Right;   Social History:  Social History   Socioeconomic History   Marital status: Married    Spouse name: Not on file   Number of children: 0   Years of education: Not on file   Highest education level: GED or equivalent  Occupational History   Occupation: retired from [employer], works for a lab driving  Tobacco Use   Smoking status: Never   Smokeless tobacco: Never  Substance and Sexual Activity   Alcohol use: No   Drug use: No   Sexual activity: Not on file  Other Topics Concern   Not on file  Social History Narrative   Married (2nd marriage)   Lives w/ wife      Has a rescue dog   Social Determinants of Health   Financial Resource Strain: Low Risk  (07/13/2023)   Overall Financial Resource Strain (CARDIA)    Difficulty of Paying Living Expenses: Not hard at all  Food Insecurity: No Food Insecurity (07/13/2023)   Hunger Vital Sign    Worried About Running Out of Food in the Last Year: Never true    Ran Out of Food in the Last Year: Never true  Transportation Needs: No Transportation Needs (07/13/2023)   PRAPARE - Administrator, Civil Service (Medical): No    Lack of Transportation (Non-Medical): No  Physical Activity: Insufficiently Active (07/13/2023)   Exercise Vital Sign    Days of Exercise per  Week: 2 days    Minutes of Exercise per Session: 20 min  Stress: Stress Concern Present (07/13/2023)   [employer] of Occupational Health - Occupational Stress Questionnaire    Feeling of Stress : To some extent  Social Connections: Moderately Isolated (07/13/2023)   Social Connection and Isolation Panel [NHANES]    Frequency of Communication with Friends and Family: Twice a week    Frequency of Social Gatherings with Friends and Family: More than three times a week    Attends Religious Services: Never    Database administrator or Organizations: No    Attends Engineer, structural: Not on file    Marital Status: Married  Catering manager Violence: Not At Risk (06/02/2023)   Humiliation, Afraid, Rape, and Kick questionnaire    Fear of Current or Ex-Partner: No    Emotionally Abused: No    Physically Abused: No    Sexually Abused: No   Family History:  Family History  Problem Relation Age of Onset   Diabetes Maternal Uncle    Heart attack Brother 81   Cancer Mother        intra-abdominal   Cirrhosis Cousin        maternal   Diabetes Cousin        maternal   Stroke Neg Hx    Colon cancer Neg Hx    Prostate cancer Neg Hx     Review of Systems: Constitutional: Doesn't report fevers, chills or abnormal weight  loss Eyes: Doesn't report blurriness of vision Ears, nose, mouth, throat, and face: Doesn't report sore throat Respiratory: Doesn't report cough, dyspnea or wheezes Cardiovascular: Doesn't report palpitation, chest discomfort  Gastrointestinal:  Doesn't report nausea, constipation, diarrhea GU: Doesn't report incontinence Skin: Doesn't report skin rashes Neurological: Per HPI Musculoskeletal: Doesn't report joint pain Behavioral/Psych: Doesn't report anxiety  Physical Exam: Vitals:   07/16/23 0901  BP: 123/69  Pulse: 77  Resp: 18  Temp: 98.4 F (36.9 C)  SpO2: 97%   KPS: 80. General: Alert, cooperative, pleasant, in no acute distress Head: Normal EENT: No conjunctival injection or scleral icterus.  Lungs: Resp effort normal Cardiac: Regular rate Abdomen: Non-distended abdomen Skin: No rashes cyanosis or petechiae. Extremities: No clubbing or edema  Neurologic Exam: Mental Status: Awake, alert, attentive to examiner. Oriented to self and environment. Language is fluent with intact comprehension.  Cranial Nerves: Visual acuity is grossly normal. Visual fields are full. Extra-ocular movements intact. No ptosis. Face is symmetric Motor: Tone and bulk are normal. Power is full in both arms and legs. Reflexes are symmetric, no pathologic reflexes present.  Sensory: Intact to light touch Gait: Normal.   Labs: I have reviewed the data as listed    Component Value Date/Time   NA 137 07/12/2023 0329   NA 139 04/06/2022 1047   K 3.3 (L) 07/12/2023 0329   CL 103 07/12/2023 0329   CO2 25 07/12/2023 0329   GLUCOSE 157 (H) 07/12/2023 0329   GLUCOSE 147 (H) 08/26/2006 1051   BUN 9 07/12/2023 0329   BUN 17 04/06/2022 1047   CREATININE 0.96 07/12/2023 0329   CREATININE 1.12 07/02/2023 0845   CREATININE 1.09 06/20/2020 1509   CALCIUM 8.9 07/12/2023 0329   PROT 5.3 (L) 07/12/2023 0329   ALBUMIN 3.0 (L) 07/12/2023 0329   AST 25 07/12/2023 0329   AST 15 07/02/2023 0845   ALT 12  07/12/2023 0329   ALT 12 07/02/2023 0845   ALKPHOS 61 07/12/2023 0329   BILITOT  0.7 07/12/2023 0329   BILITOT 0.4 07/02/2023 0845   GFRNONAA >60 07/12/2023 0329   GFRNONAA >60 07/02/2023 0845   GFRAA 126 08/26/2007 0943   Lab Results  Component Value Date   WBC 6.4 07/16/2023   NEUTROABS 4.5 07/16/2023   HGB 10.8 (L) 07/16/2023   HCT 34.6 (L) 07/16/2023   MCV 98.0 07/16/2023   PLT 158 07/16/2023     Assessment/Plan Glioblastoma, IDH-wildtype (HCC)  Seizure (HCC)  Alex Church is clinically stable today, now having completed 4 weeks of IMRT and Temodar.  Labs are WNL.  We ultimately recommended proceeding with course of intensity modulated radiation therapy and concurrent daily Temozolomide.  Radiation will be administered Mon-Fri over 6 weeks, Temodar will be dosed at 75mg /m2 to be given daily over 42 days.  We reviewed side effects of temodar, including fatigue, nausea/vomiting, constipation, and cytopenias.  Chemotherapy should be held for the following:  ANC less than 1,000  Platelets less than 100,000  LFT or creatinine greater than 2x ULN  If clinical concerns/contraindications develop  Keppra should continue at 500mg  BID.  Every 2 weeks during radiation, labs will be checked accompanied by a clinical evaluation in the brain tumor clinic.  All questions were answered. The patient knows to call the clinic with any problems, questions or concerns. No barriers to learning were detected.  The total time spent in the encounter was 30 minutes and more than 50% was on counseling and review of test results   Henreitta Leber, MD Medical Director of Neuro-Oncology [facility] at [facility] 07/16/23 9:12 AM

## 2023-07-17 ENCOUNTER — Ambulatory Visit
Admission: RE | Admit: 2023-07-17 | Discharge: 2023-07-17 | Disposition: A | Discharge status: Home / Self Care | Payer: Medicare Other | Source: Ambulatory Visit | Attending: Radiation Oncology

## 2023-07-17 ENCOUNTER — Other Ambulatory Visit: Payer: Self-pay

## 2023-07-17 DIAGNOSIS — C711 Malignant neoplasm of frontal lobe: Secondary | ICD-10-CM | POA: Diagnosis not present

## 2023-07-17 DIAGNOSIS — Z510 Encounter for antineoplastic radiation therapy: Secondary | ICD-10-CM | POA: Diagnosis not present

## 2023-07-17 LAB — RAD ONC ARIA SESSION SUMMARY
Course Elapsed Days: 29
Plan Fractions Treated to Date: 21
Plan Prescribed Dose Per Fraction: 2 Gy
Plan Total Fractions Prescribed: 23
Plan Total Prescribed Dose: 46 Gy
Reference Point Dosage Given to Date: 42 Gy
Reference Point Session Dosage Given: 2 Gy
Session Number: 21

## 2023-07-20 ENCOUNTER — Other Ambulatory Visit: Payer: Self-pay

## 2023-07-20 ENCOUNTER — Other Ambulatory Visit (HOSPITAL_COMMUNITY): Payer: Self-pay

## 2023-07-20 ENCOUNTER — Ambulatory Visit
Admission: RE | Admit: 2023-07-20 | Discharge: 2023-07-20 | Disposition: A | Discharge status: Home / Self Care | Payer: Medicare Other | Source: Ambulatory Visit | Attending: Radiation Oncology

## 2023-07-20 ENCOUNTER — Ambulatory Visit (INDEPENDENT_AMBULATORY_CARE_PROVIDER_SITE_OTHER): Payer: Medicare Other | Admitting: Internal Medicine

## 2023-07-20 ENCOUNTER — Encounter: Payer: Self-pay | Admitting: Internal Medicine

## 2023-07-20 ENCOUNTER — Ambulatory Visit: Payer: Medicare Other | Admitting: Internal Medicine

## 2023-07-20 ENCOUNTER — Ambulatory Visit
Admission: RE | Admit: 2023-07-20 | Discharge: 2023-07-20 | Disposition: A | Discharge status: Home / Self Care | Payer: Medicare Other | Source: Ambulatory Visit | Attending: Radiation Oncology | Admitting: Radiation Oncology

## 2023-07-20 VITALS — BP 122/66 | HR 72 | Temp 98.1°F | Resp 18 | Ht 72.0 in | Wt 196.1 lb

## 2023-07-20 DIAGNOSIS — E782 Mixed hyperlipidemia: Secondary | ICD-10-CM | POA: Diagnosis not present

## 2023-07-20 DIAGNOSIS — C711 Malignant neoplasm of frontal lobe: Secondary | ICD-10-CM | POA: Diagnosis not present

## 2023-07-20 DIAGNOSIS — C719 Malignant neoplasm of brain, unspecified: Secondary | ICD-10-CM

## 2023-07-20 DIAGNOSIS — Z7984 Long term (current) use of oral hypoglycemic drugs: Secondary | ICD-10-CM | POA: Diagnosis not present

## 2023-07-20 DIAGNOSIS — M1A9XX1 Chronic gout, unspecified, with tophus (tophi): Secondary | ICD-10-CM | POA: Diagnosis not present

## 2023-07-20 DIAGNOSIS — E1165 Type 2 diabetes mellitus with hyperglycemia: Secondary | ICD-10-CM | POA: Diagnosis not present

## 2023-07-20 DIAGNOSIS — Z510 Encounter for antineoplastic radiation therapy: Secondary | ICD-10-CM | POA: Diagnosis not present

## 2023-07-20 DIAGNOSIS — Z23 Encounter for immunization: Secondary | ICD-10-CM

## 2023-07-20 DIAGNOSIS — R569 Unspecified convulsions: Secondary | ICD-10-CM

## 2023-07-20 LAB — RAD ONC ARIA SESSION SUMMARY
Course Elapsed Days: 32
Plan Fractions Treated to Date: 22
Plan Prescribed Dose Per Fraction: 2 Gy
Plan Total Fractions Prescribed: 23
Plan Total Prescribed Dose: 46 Gy
Reference Point Dosage Given to Date: 44 Gy
Reference Point Session Dosage Given: 2 Gy
Session Number: 22

## 2023-07-20 LAB — LIPID PANEL
Cholesterol: 125 mg/dL (ref 0–200)
HDL: 44.5 mg/dL (ref >39.00)
LDL Cholesterol: 40 mg/dL (ref 0–99)
NonHDL: 80.21
Total CHOL/HDL Ratio: 3
Triglycerides: 203 mg/dL — ABNORMAL HIGH (ref 0.0–149.0)
VLDL: 40.6 mg/dL — ABNORMAL HIGH (ref 0.0–40.0)

## 2023-07-20 LAB — URIC ACID: Uric Acid, Serum: 5.2 mg/dL (ref 4.0–7.8)

## 2023-07-20 LAB — MICROALBUMIN / CREATININE URINE RATIO
Creatinine,U: 126 mg/dL
Microalb Creat Ratio: 0.6 mg/g (ref 0.0–30.0)
Microalb, Ur: <0.7 mg/dL (ref 0.0–1.9)

## 2023-07-20 LAB — HEMOGLOBIN A1C: Hgb A1c MFr Bld: 6.8 % — ABNORMAL HIGH (ref 4.6–6.5)

## 2023-07-20 MED ORDER — COLCHICINE 0.6 MG PO CAPS
1.0000 | ORAL_CAPSULE | Freq: Two times a day (BID) | ORAL | 0 refills | Status: DC | PRN
  Filled 2023-07-20 – 2023-07-23 (×3): qty 60, 30d supply, fill #0

## 2023-07-20 MED ORDER — SONAFINE EX EMUL
1.0000 | Freq: Two times a day (BID) | CUTANEOUS | Status: DC
  Administered 2023-07-20: 1 via TOPICAL

## 2023-07-20 NOTE — Progress Notes (Signed)
Subjective:    Patient ID: Alex Church, male    DOB: 1945-11-24, 77 y.o.   MRN: [id number]  DOS:  07/20/2023 Type of visit - description: Hospital follow-up  LOV 04-2023. Since then he was diagnosed with glioblastoma.  Chart reviewed. Also was admitted 07/11/2023 with episodes of "LOC, unresponsiveness" eventually diagnosed with a seizure.  Started Keppra. Troponins were increased, cardiology  consulted . Echocardiogram showed decreased EF. No aspirin or Plavix recommended due to recent brain surgery. Zio patch was recommended as an outpatient.  Since he left the hospital is doing well. No more seizures No chest pain no difficulty breathing No nausea or vomiting. Appetite is okay. Taking ibuprofen for gout.   Review of Systems See above   Past Medical History:  Diagnosis Date   B12 deficiency    monthly shots   Diabetes mellitus    Glaucoma suspect    Glioblastoma (HCC)    Hyperlipemia    Hypertension     Past Surgical History:  Procedure Laterality Date   APPLICATION OF CRANIAL NAVIGATION Right 05/12/2023   Procedure: APPLICATION OF CRANIAL NAVIGATION;  Surgeon: Dawley, Alan Mulder, DO;  Location: [facility] OR;  Service: Neurosurgery;  Laterality: Right;   CRANIOTOMY Right 05/12/2023   Procedure: RIGHT STERIOSTACTIC FRONTAL CRANIOTOMY FOR TUMOR RESECTION;  Surgeon: Bethann Goo, DO;  Location: [facility] OR;  Service: Neurosurgery;  Laterality: Right;    Current Outpatient Medications  Medication Instructions   acetaminophen (TYLENOL) 1,000 mg, Oral, Every 6 hours PRN   allopurinol (ZYLOPRIM) 100 mg, Oral, 2 times daily   atorvastatin (LIPITOR) 10 mg, Oral, Daily at bedtime   Blood Glucose Monitoring Suppl (ONETOUCH VERIO FLEX SYSTEM) w/Device KIT Check blood sugar once daily   carvedilol (COREG) 6.25 mg, Oral, 2 times daily with meals, Due for appt 05/2023   cloNIDine (CATAPRES) 0.1 mg, Oral, 2 times daily   Colchicine (MITIGARE) 0.6 mg, Oral, 2 times daily PRN    diphenhydramine-acetaminophen (TYLENOL PM) 25-500 MG TABS tablet 1 tablet, Oral, At bedtime PRN   glucose blood test strip Check blood sugars once daily   Lancets (ONETOUCH ULTRASOFT) lancets Check bloods sugars once daily   levETIRAcetam (KEPPRA) 500 mg, Oral, 2 times daily   lisinopril (ZESTRIL) 20 mg, Oral, Daily at bedtime   melatonin 5 mg, Oral, Daily at bedtime   metFORMIN (GLUCOPHAGE) 500 mg, Oral, 2 times daily with meals   ondansetron (ZOFRAN) 8 mg, Oral, Every 8 hours PRN, May take 30-60 minutes prior to Temodar administration if nausea/vomiting occurs as needed.   pioglitazone (ACTOS) 30 mg, Oral, Daily   polyethylene glycol (MIRALAX / GLYCOLAX) 17 g, Oral, Daily PRN   sitaGLIPtin (JANUVIA) 100 mg, Oral, Daily   sodium chloride 1 g, Oral, 3 times daily with meals   tamsulosin (FLOMAX) 0.4 mg, Oral, Daily after supper   temozolomide (TEMODAR) 140 mg, Oral, Daily at bedtime, May take on an empty stomach to decrease nausea & vomiting.   Vitamin B-12 1,000 mcg, Oral, Daily       Objective:   Physical Exam BP 122/66   Pulse 72   Temp 98.1 F (36.7 C) (Oral)   Resp 18   Ht 6' (1.829 m)   Wt 196 lb 2 oz (89 kg)   SpO2 94%   BMI 26.60 kg/m  General:   Well developed, NAD, BMI noted. HEENT:  Normocephalic . Face symmetric, atraumatic Lungs:  CTA B Normal respiratory effort, no intercostal retractions, no accessory muscle use. Heart: RRR,  no murmur.  Lower extremities: no pretibial edema bilaterally  Skin: Not pale. Not jaundice Neurologic:  alert & oriented X3.  Speech normal, gait appropriate for age and unassisted.  Motor symmetric Psych--  Cognition and judgment appear intact.  Cooperative with normal attention span and concentration.  Behavior appropriate. No anxious or depressed appearing.      Assessment     Assessment (transfer from Dr Alwyn Ren 02-2016)  DM HTN Hyperlipidemia Frequent PVCs versus trigeminy (resolved after hypomagnesemia  treatment) Hypomagnesemia: Not on PPIs on diuretics. 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 Glioblastoma, large R frontal brain, Dx [id number].  Surgery 05/12/2023 RX: XRT and Temodar  Seizures 07-2023 +FH CAD  PLAN: Glioblastoma, large, R frontal brain, Dx [id number].  Surgery 05/12/2023 RX: XRT and Temodar, still have 2 additional weeks to go. Seizures 07-2023: Recently admitted, on Keppra, no further episodes. Decrease EF, increased troponin: Noted during recent admission, wearing a Zio patch, to see cardiology in the next several days.  Currently with no symptoms. DM: Metformin dose decreased to half tablet twice daily while in the hospital.  Also takes pioglitazone and Januvia.  Check A1c High cholesterol: On atorvastatin.  Checking FLP. Gout: On allopurinol, having problems at the great toes with pain and swelling for which he takes ibuprofen.  Recommend to stop NSAIDs, try colchicine.  Checking uric acid level. Preventive care: Flu shot today, recommend to proceed with COVID-vaccine if not done in the last 2 to 3 months. RTC CPX 3 to 4 months

## 2023-07-20 NOTE — Patient Instructions (Addendum)
Stop ibuprofen Take colchicine twice daily as needed for gout.  If you are taking them more than 3 days of the week let me know. You can also take Tylenol and apply ice to the area.  Vaccines I recommend; Covid booster if not done in the last 2 to 3 months   Continue checking your blood sugars and blood pressure regularly     GO TO THE LAB : Get the blood work     Next visit with me for a physical exam in 3 to 4 months    Please schedule it at the front desk        If you have MyChart, please check frequently in the next few days for your results        Per our records you are due for your diabetic eye exam. Please contact your eye doctor to schedule an appointment. Please have them send copies of your office visit notes to [place]. Our fax number is [phone]. If you need a referral to an eye doctor please let us know.

## 2023-07-20 NOTE — Assessment & Plan Note (Signed)
Glioblastoma, large, R frontal brain, Dx [id number].  Surgery 05/12/2023 RX: XRT and Temodar, still have 2 additional weeks to go. Seizures 07-2023: Recently admitted, on Keppra, no further episodes. Decrease EF, increased troponin: Noted during recent admission, wearing a Zio patch, to see cardiology in the next several days.  Currently with no symptoms. DM: Metformin dose decreased to half tablet twice daily while in the hospital.  Also takes pioglitazone and Januvia.  Check A1c High cholesterol: On atorvastatin.  Checking FLP. Gout: On allopurinol, having problems at the great toes with pain and swelling for which he takes ibuprofen.  Recommend to stop NSAIDs, try colchicine.  Checking uric acid level. Preventive care: Flu shot today, recommend to proceed with COVID-vaccine if not done in the last 2 to 3 months. RTC CPX 3 to 4 months

## 2023-07-21 ENCOUNTER — Encounter (HOSPITAL_COMMUNITY): Payer: Medicare Other | Admitting: Speech Pathology

## 2023-07-21 ENCOUNTER — Ambulatory Visit
Admission: RE | Admit: 2023-07-21 | Discharge: 2023-07-21 | Disposition: A | Discharge status: Home / Self Care | Payer: Medicare Other | Source: Ambulatory Visit | Attending: Radiation Oncology | Admitting: Radiation Oncology

## 2023-07-21 ENCOUNTER — Other Ambulatory Visit: Payer: Self-pay

## 2023-07-21 ENCOUNTER — Ambulatory Visit: Payer: Medicare Other

## 2023-07-21 ENCOUNTER — Ambulatory Visit (HOSPITAL_COMMUNITY): Payer: Medicare Other | Admitting: Occupational Therapy

## 2023-07-21 ENCOUNTER — Other Ambulatory Visit (HOSPITAL_COMMUNITY): Payer: Self-pay

## 2023-07-21 ENCOUNTER — Encounter (HOSPITAL_COMMUNITY): Payer: Self-pay | Admitting: Occupational Therapy

## 2023-07-21 DIAGNOSIS — R29818 Other symptoms and signs involving the nervous system: Secondary | ICD-10-CM

## 2023-07-21 DIAGNOSIS — R41841 Cognitive communication deficit: Secondary | ICD-10-CM | POA: Diagnosis not present

## 2023-07-21 DIAGNOSIS — R278 Other lack of coordination: Secondary | ICD-10-CM

## 2023-07-21 DIAGNOSIS — Z510 Encounter for antineoplastic radiation therapy: Secondary | ICD-10-CM | POA: Diagnosis not present

## 2023-07-21 DIAGNOSIS — C711 Malignant neoplasm of frontal lobe: Secondary | ICD-10-CM | POA: Diagnosis not present

## 2023-07-21 DIAGNOSIS — R262 Difficulty in walking, not elsewhere classified: Secondary | ICD-10-CM | POA: Diagnosis not present

## 2023-07-21 DIAGNOSIS — R29898 Other symptoms and signs involving the musculoskeletal system: Secondary | ICD-10-CM | POA: Diagnosis not present

## 2023-07-21 LAB — RAD ONC ARIA SESSION SUMMARY
Course Elapsed Days: 33
Plan Fractions Treated to Date: 23
Plan Prescribed Dose Per Fraction: 2 Gy
Plan Total Fractions Prescribed: 23
Plan Total Prescribed Dose: 46 Gy
Reference Point Dosage Given to Date: 46 Gy
Reference Point Session Dosage Given: 2 Gy
Session Number: 23

## 2023-07-21 NOTE — Therapy (Unsigned)
OUTPATIENT OCCUPATIONAL THERAPY NEURO TREATMENT NOTE  Patient Name: Alex Church MRN: [id number] DOB:1945/10/25, 77 y.o., male Today's Date: 07/22/2023  PCP: Willow Ora, MD REFERRING PROVIDER: Delle Reining, PA-C  END OF SESSION:  OT End of Session - 07/21/23 1346     Visit Number 6    Number of Visits 7    Date for OT Re-Evaluation 07/31/23    Authorization Type [employer] Medicare    OT Start Time 1308    OT Stop Time 1346    OT Time Calculation (min) 38 min    Activity Tolerance Patient tolerated treatment well    Behavior During Therapy WFL for tasks assessed/performed             Past Medical History:  Diagnosis Date   B12 deficiency    monthly shots   Diabetes mellitus    Glaucoma suspect    Glioblastoma (HCC)    Hyperlipemia    Hypertension    Past Surgical History:  Procedure Laterality Date   APPLICATION OF CRANIAL NAVIGATION Right 05/12/2023   Procedure: APPLICATION OF CRANIAL NAVIGATION;  Surgeon: Bethann Goo, DO;  Location: [facility] OR;  Service: Neurosurgery;  Laterality: Right;   CRANIOTOMY Right 05/12/2023   Procedure: RIGHT STERIOSTACTIC FRONTAL CRANIOTOMY FOR TUMOR RESECTION;  Surgeon: Bethann Goo, DO;  Location: [facility] OR;  Service: Neurosurgery;  Laterality: Right;   Patient Active Problem List   Diagnosis Date Noted   Seizure (HCC) 07/12/2023   Hypomagnesemia 07/12/2023   Myocardial injury 07/12/2023   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   Glioblastoma, 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   PVC (premature ventricular contraction) 08/27/2022   Glaucoma suspect 04/30/2020   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   Annual physical exam 06/30/2016   PCP NOTES >>>>>>>>>>>>>>>>>>>>> 02/25/2016   DM2 (diabetes mellitus, type 2) (HCC) 07/06/2013   B12 deficiency 07/06/2013   CAROTID BRUIT, RIGHT 10/01/2010   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  PRECAUTIONS: Fall  WEIGHT BEARING RESTRICTIONS: No  PAIN:  Are you having pain? No  FALLS: Has patient fallen in last 6 months? Yes. Number of falls "multiple"  LIVING ENVIRONMENT: Lives with: lives with their family Lives in: House/apartment Stairs: Yes: Internal: 13 steps; can reach both and External: 3 steps; none Has following equipment at home: Single point cane, Walker - 2 wheeled, shower chair, bed side commode, and Grab bars  PLOF: Independent  PATIENT GOALS: "I'd like to get back to where I was at"  OBJECTIVE:  Note: Objective measures were completed at Evaluation unless otherwise noted.  HAND DOMINANCE: Right  ADLs: Overall ADLs: Pt reports difficulty with lifting and carrying heavy items, specifically, yard work, cleaning, and cooking.   MOBILITY STATUS: Hx of falls  POSTURE COMMENTS:  rounded shoulders Sitting balance: Moves/returns truncal midpoint 1-2 inches in multiple planes  ACTIVITY TOLERANCE: Activity tolerance: Mildly limited  FUNCTIONAL OUTCOME MEASURES: FOTO: 57.89/100  UPPER EXTREMITY ROM:    All Motions WFL  UPPER EXTREMITY MMT:     MMT Right eval Left eval  Shoulder flexion 4/5 4-/5  Shoulder abduction 5/5 4+/5  Shoulder adduction 5/5 5/5  Shoulder extension 4+/5 4+/5  Shoulder internal rotation 4+/5 4/5  Shoulder external rotation 4+/5 4/5  Elbow  flexion 5/5 4+/5  Elbow extension 5/5 4/5  Wrist flexion 5/5 4+/5  Wrist extension 5/5 4+/5  Wrist ulnar deviation 5/5 5/5  Wrist radial deviation 5/5 5/5  Wrist pronation 5/5 5/5  Wrist supination 5/5 5/5  (Blank rows = not tested)  HAND FUNCTION: Grip strength: Right: 68 lbs; Left: 64 lbs, Lateral pinch: Right: 19 lbs, Left: 18 lbs, and 3 point pinch: Right: 15 lbs, Left: 12 lbs  COORDINATION: 9 Hole Peg test: Right: 30.87 sec; Left: 34.80 sec  SENSATION: WFL  EDEMA: No swelling noted  OBSERVATIONS: Mildly slowed movements in the hands   TODAY'S TREATMENT:                                                                                                                              DATE:   07/21/23 -Grooved peg board -Gripper: RUE 55# 10 medium beads, LUE 38# 10 medium beads -Cards: BUE shuffling, using thumb to Push 1 card out and flip over x10 each hand -Wrist Strengthening: 3lb dumbbell, flexion, extension, ulnar/radial deviation, supination/pronation, x12 -Strengthening: 3lb dumbbell, hammer curl, bicep curl, protraction, horizontal abduction, x15 -Theraband strengthening: chest pulls, overhead pulls, PNF up, PNF down, x12  07/14/23 -Theraball Exercises: flexion, protraction, overhead press, V ups, circles both directions, x12 -Theraputty: red putty, put 12 tiny pegs in putty, roll into a ball, push, pinch and pull to find all tiny pegs -Tiny Peg Board: using tweezers completing pattern provided  07/07/23 -Shoulder Strengthening: 2lb dumbbell,  flexion, abduction, protraction, horizontal abduction, er/IR, x10 -Strengthening: 4lb, hammer curl, bicep curl, x10 -Wrist Strengthening: 3lb dumbbell, extension, flexion, ulnar/radial deviation, supination/pronation, x10 -Building a table: min difficulty following complete instructions, good bilateral manual skills.    PATIENT EDUCATION: Education details: PNF Strengthening Person educated: Patient Education method:  Explanation Education comprehension: verbalized understanding  HOME EXERCISE PROGRAM: 10/16: Shoulder strengthening 10/22: Scapular Strengthening 10/29: Wrist Strengthening  11/12: PNF Strengthening   GOALS: Goals reviewed with patient? Yes  SHORT TERM GOALS: Target date: 07/31/23  Pt will be provided comprehensive HEP for BUE strengthening in order to complete function tasks independently.   Goal status: IN PROGRESS  2.  Pt will improve BUE strength in order to complete cooking and cleaning at home independently.   Goal status: IN PROGRESS  3.  Pt will increased BUE grip by 10lbs and pinch by 2lbs in order to maintain grasp during yard work tasks.   Goal status: IN PROGRESS  4.  Pt will increase BUE coordination to 27 seconds or quicker in order to improve effectiveness of manipulating buttons, zippers, and clasps.  Goal status: IN PROGRESS   ASSESSMENT:  CLINICAL IMPRESSION: Pt continuing to have increased fatigue, which he is relating to his radiation treatments. This session he presented with mildly increased weakness, requiring lighter weights. He was able to complete all exercises, only taking short breaks between each set of exercises. Overall coordination, continues to improve with effectiveness, however he requires increased time for good control. Verbal and tactile cuing provided for positioning and technique.   PERFORMANCE DEFICITS: in functional skills including ADLs, IADLs, coordination, ROM, strength, Fine motor control, Gross motor control, body mechanics, and UE functional use.    PLAN:  OT FREQUENCY: 1-2x/week  OT DURATION: 6 weeks  PLANNED INTERVENTIONS: self care/ADL training, therapeutic exercise, therapeutic activity, neuromuscular re-education, manual therapy, passive range of motion, functional mobility training, electrical stimulation, ultrasound, paraffin, moist heat, patient/family education, coping strategies training, and DME and/or AE  instructions  RECOMMENDED OTHER SERVICES: N/A  CONSULTED AND AGREED WITH PLAN OF CARE: Patient  PLAN FOR NEXT SESSION: Manual therapy as needed, shoulder strengthening, wrist strengthening, grip and pinch strengthening, stabilization.   Trish Mage, OTR/L [facility] [phone] Kennyth Arnold, OT 07/22/2023, 3:06 PM

## 2023-07-21 NOTE — Patient Instructions (Signed)

## 2023-07-22 ENCOUNTER — Other Ambulatory Visit: Payer: Self-pay

## 2023-07-22 ENCOUNTER — Ambulatory Visit: Payer: Medicare Other

## 2023-07-22 ENCOUNTER — Ambulatory Visit
Admission: RE | Admit: 2023-07-22 | Discharge: 2023-07-22 | Disposition: A | Discharge status: Home / Self Care | Payer: Medicare Other | Source: Ambulatory Visit | Attending: Radiation Oncology | Admitting: Radiation Oncology

## 2023-07-22 DIAGNOSIS — Z510 Encounter for antineoplastic radiation therapy: Secondary | ICD-10-CM | POA: Diagnosis not present

## 2023-07-22 DIAGNOSIS — C711 Malignant neoplasm of frontal lobe: Secondary | ICD-10-CM | POA: Diagnosis not present

## 2023-07-22 LAB — RAD ONC ARIA SESSION SUMMARY
Course Elapsed Days: 34
Plan Fractions Treated to Date: 1
Plan Prescribed Dose Per Fraction: 2 Gy
Plan Total Fractions Prescribed: 7
Plan Total Prescribed Dose: 14 Gy
Reference Point Dosage Given to Date: 2 Gy
Reference Point Session Dosage Given: 2 Gy
Session Number: 24

## 2023-07-23 ENCOUNTER — Encounter: Payer: Self-pay | Admitting: Internal Medicine

## 2023-07-23 ENCOUNTER — Other Ambulatory Visit: Payer: Self-pay

## 2023-07-23 ENCOUNTER — Ambulatory Visit
Admission: RE | Admit: 2023-07-23 | Discharge: 2023-07-23 | Disposition: A | Discharge status: Home / Self Care | Payer: Medicare Other | Source: Ambulatory Visit | Attending: Radiation Oncology | Admitting: Radiation Oncology

## 2023-07-23 ENCOUNTER — Ambulatory Visit (HOSPITAL_COMMUNITY): Payer: Medicare Other

## 2023-07-23 DIAGNOSIS — R29818 Other symptoms and signs involving the nervous system: Secondary | ICD-10-CM | POA: Diagnosis not present

## 2023-07-23 DIAGNOSIS — R29898 Other symptoms and signs involving the musculoskeletal system: Secondary | ICD-10-CM | POA: Diagnosis not present

## 2023-07-23 DIAGNOSIS — R262 Difficulty in walking, not elsewhere classified: Secondary | ICD-10-CM

## 2023-07-23 DIAGNOSIS — R41841 Cognitive communication deficit: Secondary | ICD-10-CM | POA: Diagnosis not present

## 2023-07-23 DIAGNOSIS — R278 Other lack of coordination: Secondary | ICD-10-CM | POA: Diagnosis not present

## 2023-07-23 DIAGNOSIS — C711 Malignant neoplasm of frontal lobe: Secondary | ICD-10-CM | POA: Diagnosis not present

## 2023-07-23 DIAGNOSIS — Z510 Encounter for antineoplastic radiation therapy: Secondary | ICD-10-CM | POA: Diagnosis not present

## 2023-07-23 LAB — RAD ONC ARIA SESSION SUMMARY
Course Elapsed Days: 35
Plan Fractions Treated to Date: 2
Plan Prescribed Dose Per Fraction: 2 Gy
Plan Total Fractions Prescribed: 7
Plan Total Prescribed Dose: 14 Gy
Reference Point Dosage Given to Date: 4 Gy
Reference Point Session Dosage Given: 2 Gy
Session Number: 25

## 2023-07-23 MED ORDER — COLCHICINE 0.6 MG PO CAPS: 1.0000 | ORAL_CAPSULE | Freq: Two times a day (BID) | ORAL | 0 refills | Status: DC | PRN

## 2023-07-23 NOTE — Therapy (Signed)
OUTPATIENT PHYSICAL THERAPY NEURO Treatment    Patient Name: ABDURRAHEEM Church MRN: [id number] DOB:Dec 13, 1945, 77 y.o., male Today's Date: 07/23/2023   PCP: Wanda Plump, MD REFERRING PROVIDER: Jacquelynn Cree, PA-C  END OF SESSION:  PT End of Session - 07/23/23 1149     Visit Number 9    Number of Visits 15    Date for PT Re-Evaluation 08/06/23    Authorization Type [employer] Medicare    Progress Note Due on Visit 18    PT Start Time 1150    PT Stop Time 1230    PT Time Calculation (min) 40 min    Activity Tolerance Patient tolerated treatment well    Behavior During Therapy WFL for tasks assessed/performed                 Past Medical History:  Diagnosis Date   B12 deficiency    monthly shots   Diabetes mellitus    Glaucoma suspect    Glioblastoma (HCC)    Hyperlipemia    Hypertension    Past Surgical History:  Procedure Laterality Date   APPLICATION OF CRANIAL NAVIGATION Right 05/12/2023   Procedure: APPLICATION OF CRANIAL NAVIGATION;  Surgeon: Bethann Goo, DO;  Location: [facility] OR;  Service: Neurosurgery;  Laterality: Right;   CRANIOTOMY Right 05/12/2023   Procedure: RIGHT STERIOSTACTIC FRONTAL CRANIOTOMY FOR TUMOR RESECTION;  Surgeon: Bethann Goo, DO;  Location: [facility] OR;  Service: Neurosurgery;  Laterality: Right;   Patient Active Problem List   Diagnosis Date Noted   Seizure (HCC) 07/12/2023   Hypomagnesemia 07/12/2023   Myocardial injury 07/12/2023   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   Glioblastoma, 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   PVC (premature ventricular contraction) 08/27/2022   Glaucoma suspect 04/30/2020   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   Annual physical exam 06/30/2016   PCP NOTES >>>>>>>>>>>>>>>>>>>>> 02/25/2016   DM2 (diabetes mellitus, type 2) (HCC) 07/06/2013   B12 deficiency 07/06/2013   CAROTID BRUIT, RIGHT 10/01/2010   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  PERTINENT HISTORY: PVC  PAIN:  Are you having pain? No  PRECAUTIONS: Fall  RED FLAGS: Bowel or bladder incontinence: Yes: recently diagnosed with UTI    WEIGHT BEARING RESTRICTIONS: No  FALLS: Has patient fallen in last 6 months? Yes. Number of falls 2 (most recent was in August 2024)  LIVING ENVIRONMENT: Lives with: lives with their spouse Lives in: House/apartment Stairs: Yes: Internal: 12-13 steps; can reach both and External: 5 steps; on right going up Has following equipment at home: Single point cane, Walker - 2 wheeled, and Wheelchair (manual)  PLOF: Independent and Independent with basic ADLs  PATIENT GOALS: Wife wishes for the patient's legs to be strong again  OBJECTIVE:  Note: Objective measures were completed at Evaluation unless otherwise noted.  DIAGNOSTIC FINDINGS:  05/13/23 MRI HEAD WITHOUT AND WITH CONTRAST   TECHNIQUE: Multiplanar, multiecho pulse sequences of the brain and surrounding structures were obtained without and with intravenous contrast.   CONTRAST:  8mL GADAVIST GADOBUTROL 1 MMOL/ML IV SOLN   COMPARISON:  MRI head 05/05/2023.   FINDINGS: Brain: Interval debulking of a right frontal lesion appears larger section cavity with expected postoperative blood products in the resection  cavity as well as overlying extra-axial spaces. Small to moderate volume of pneumocephalus antidependently along the frontal convexities anteriorly. Surrounding T2/FLAIR hyperintensities is mildly improved and mass effect is mildly improved. Leftward midline shift now measures approximately 4 mm anteriorly. No suspicious residual enhancement, although postoperative left limits assessment. Mm continued attention on follow-up.   No evidence of acute infarct or hydrocephalus.   Vascular: Major arterial flow voids are maintained at the skull base.   Skull and upper cervical spine: Normal marrow signal.   Sinuses/Orbits: Clear sinuses.  No acute orbital findings.   Other: No mastoid effusions.   IMPRESSION: Expected postoperative findings after debulking of a large right frontal mass, detailed above.MRI HEAD WITHOUT AND WITH CONTRAST   TECHNIQUE: Multiplanar, multiecho pulse sequences of the brain and surrounding structures were obtained without and with intravenous contrast.   CONTRAST:  8mL GADAVIST GADOBUTROL 1 MMOL/ML IV SOLN   COMPARISON:  MRI head 05/05/2023.   FINDINGS: Brain: Interval debulking of a right frontal lesion appears larger section cavity with expected postoperative blood products in the resection cavity as well as overlying extra-axial spaces. Small to moderate volume of pneumocephalus antidependently along the frontal convexities anteriorly. Surrounding T2/FLAIR hyperintensities is mildly improved and mass effect is mildly improved. Leftward midline shift now measures approximately 4 mm anteriorly. No suspicious residual enhancement, although postoperative left limits assessment. Mm continued attention on follow-up.   No evidence of acute infarct or hydrocephalus.   Vascular: Major arterial flow voids are maintained at the skull base.   Skull and upper cervical spine: Normal marrow signal.   Sinuses/Orbits: Clear sinuses.  No acute orbital findings.    Other: No mastoid effusions.   IMPRESSION: Expected postoperative findings after debulking of a large right frontal mass, detailed above.  05/05/23 MRI HEAD WITHOUT AND WITH CONTRAST   TECHNIQUE: Multiplanar, multiecho pulse sequences of the brain and surrounding structures were obtained without and with intravenous contrast.   CONTRAST:  9mL GADAVIST GADOBUTROL 1 MMOL/ML IV SOLN   COMPARISON:  05/05/2023 CTA head neck   FINDINGS: Brain: There is a large, predominantly peripherally contrast-enhancing mass located within the right frontal lobe, measuring 6.8 x 4.9 cm. There is a large amount of surrounding hyperintense T2-weighted signal. There is marked  mass effect on the right lateral ventricle and leftward midline shift 6 mm. There is early subfalcine herniation of the cingulate gyrus. No acute hemorrhage. The central necrotic component of the mass does not restrict diffusion.   Vascular: Major flow voids are preserved.   Skull and upper cervical spine: Normal calvarium and skull base. Visualized upper cervical spine and soft tissues are normal.   Sinuses/Orbits:No paranasal sinus fluid levels or advanced mucosal thickening. No mastoid or middle ear effusion. Normal orbits.   IMPRESSION: 1. Large, predominantly peripherally contrast-enhancing mass located within the right frontal lobe, measuring 6.8 x 4.9 cm. Large amount of surrounding hyperintense T2-weighted signal. This is most consistent with a high-grade glioma. 2. Marked mass effect on the right lateral ventricle and 6 mm leftward midline shift. Early subfalcine herniation of the cingulate gyrus.  COGNITION: Overall cognitive status: Within functional limits for tasks assessed   SENSATION: WFL  COORDINATION: Heel to shin: intact and WFL on B Alternating foot taps: intact and WFL  MUSCLE TONE: normal  MUSCLE LENGTH: Hamstrings: moderate restriction on B Gastrocsoleus: moderate restriction on  B  POSTURE: Standing: rounded shoulders, forward head, and decreased lumbar lordosis  LOWER EXTREMITY ROM:     Active  Right Eval Left Eval  Hip flexion [facility]  Hip extension [facility]  Hip abduction [facility]  Hip adduction    Hip internal rotation    Hip external rotation    Knee flexion [facility]  Knee extension [facility]  Ankle dorsiflexion [facility]  Ankle plantarflexion [facility] WFL  Ankle inversion    Ankle eversion     (Blank rows = not tested)  LOWER EXTREMITY MMT:    MMT Right Eval Left Eval Right 07/09/23 Left 07/09/23  Hip flexion 3+ 3+ 4 4-  Hip extension 2+ 2+ 3- 3-  Hip abduction 4- 3+ 4 4-  Hip adduction      Hip internal rotation      Hip external rotation      Knee flexion 4+ 4+ 5 4+  Knee extension 4+ 3+ 4+ 4+  Ankle dorsiflexion 4+ 4+ 4+ 4+  Ankle plantarflexion 4+ 4+ 4+ 4+  Ankle inversion      Ankle eversion      (Blank rows = not tested)  BED MOBILITY:  Sit to supine Complete Independence Supine to sit Complete Independence Rolling to Right Complete Independence Rolling to Left Complete Independence  TRANSFERS: Assistive device utilized: Single point cane  Sit to stand: Modified independence Stand to sit: Modified independence  STAIRS: Level of Assistance: Modified independence Stair Negotiation Technique: Alternating Pattern  with Bilateral Rails Number of Stairs: 4  Height of Stairs: 7"  Comments: Little to no unsteadiness noted  GAIT: Gait pattern: decreased hip/knee flexion- Right, decreased hip/knee flexion- Left, and trendelenburg Distance walked: 308 ft Assistive device utilized: Single point cane Level of assistance: Modified independence Comments: done during  FUNCTIONAL TESTS:  5 times sit to stand: 18.30 sec 2 minute walk test: 308 ft Dynamic Gait Index: 15 Tinetti (balance + gait) = 19   DGI 1. Gait level surface (2) Mild Impairment: Walks 20', uses assistive devices, slower speed, mild gait deviations. 2. Change  in gait speed (3) Normal: Able to smoothly change walking speed without loss of balance or gait deviation. Shows a significant difference in walking speeds between normal, fast and slow speeds. 3. Gait with horizontal head turns (1) Moderate Impairment: Performs head turns with moderate change in gait velocity, slows  down, staggers but recovers, can continue to walk. 4. Dellia Nims with vertical head turns  1) Moderate Impairment: Performs head turns with moderate change in gait velocity, slows down, staggers but recovers, can continue to walk. 5. Gait and pivot turn (2) Mild Impairment: Pivot turns safely in > 3 seconds and stops with no loss of balance. 6. Step over obstacle (2) Mild Impairment: Is able to step over box, but must slow down and adjust steps to clear box safely. 7. Step around obstacles (2) Mild Impairment: Is able to step around both cones, but must slow down and adjust steps to clear cones. 8. Stairs (2) Mild Impairment: Alternating feet, must use rail.  TOTAL SCORE: 15 / 24   PATIENT SURVEYS:  LEFS 51/80 = 63.7% 07/08/20: LEFS 65/80= 81.2%  TODAY'S TREATMENT:                                                                                                                              DATE: 07/23/23  Nustep L3 seat 10x 5' UE/LE Standing: Heel raises x 20 Toe raises x 20 Marching no UE x 10 each Sit to stand x 10 6" box mini lunge x 10 no UE assist 6" box step [place] minimal UE assist x 10 each 3# hip extension 2 x 10 3# hip abduction 2 x 10 Heel toe walking on 20' line with CGA x 3  Seated #3 LAQs 2 x 10 each  07/15/23 Nustep L5 seat 10x 5' UE/LE Standing: Heel raises x 20 Tandem stance 2 x 30" each 4" step ups no UE assist 3# marching with 1 UE assist 2 x 10  Seated: 3# LAQs 2 x 10 each      07/09/23: 5 times sit to stand: 18.30 sec; 07/09/23: 14.88" 2 minute walk test: eval: 308 ft; 07/09/23: 468 ft Dynamic Gait Index: 15; 07/09/23:  23/24 Tinetti  (balance + gait) = 19 07/09/23: 28/28 LEFS 65/80=  DGI 1. Gait level surface (3) Normal: Walks 20', no assistive devices, good sped, no evidence for imbalance, normal gait pattern 2. Change in gait speed (3) Normal: Able to smoothly change walking speed without loss of balance or gait deviation. Shows a significant difference in walking speeds between normal, fast and slow speeds. 3. Gait with horizontal head turns (3) Normal: Performs head turns smoothly with no change in gait. 4. Gait with vertical head turns (3) Normal: Performs head turns smoothly with no change in gait. 5. Gait and pivot turn (3) Normal: Pivot turns safely within 3 seconds and stops quickly with no loss of balance. 6. Step over obstacle (3) Normal: Is able to step over the box without changing gait speed, no evidence of imbalance. 7. Step around obstacles (3) Normal: Is able to walk around cones safely without changing gait speed; no evidence of imbalance. 8. Stairs (2) Mild Impairment: Alternating feet, must use rail.  TOTAL SCORE: 23 / 24  MMT see above  07/03/23:  Standing: Sit to stand slow control then power up to heel raise no HHA 15x Functional squat 12 reps SLS Lt 4", Rt 2" max of 3 Vector stance 3x 5" with 1 HHA Tandem stance 1x 30" on solid floor; 2x 30" on foam Sidestep GTB 5RT inside // bars minimal HHA Stairs 1 HR 3RT 7in step height Nustep L5 seat 10x 5' UE/LE   06/30/23 Nustep level 4 seat 10;  5 minutes UE/LE Standing: Heelraises on incline x 20 light UE assist Toe raises on decline x20 light UE assist Lunges onto 4" no UE assist 2X10 each 6" forward step ups 20X each LE 6" lateral step ups 20X each LE Vectors 5X 3" each with 1 UE assist 3# hip abduction 2 x 10 3# hip extension 2 x 10 3# marching 2 x 10 Sit to stand 2 x 5 no UE assist  06/25/23 Nustep level 4 seat 12;  5 minutes Standing: Heelraises on incline x 20 light UE assist Toe raises on decline x 10 light UE  assist Tandem stance 2 x 30" sec no UE assist  Lunges onto 4" no UE assist 2X10 each Vectors 5X 3" each with 1 UE assist SLS x 20" each occassional UE assist 2# hip abduction 2 x 10 2# hip extension 2 x 10 2# marching 2 x 10 Sit to stand 2 x 5 no UE assist  06/23/23 Standing: Heels raises on incline x 20 light UE assist Toe raises on decline x 10 light UE assist Sit to stand 2 x 5 no UE assist Tandem stance 2 x 30" sec no UE assist SLS x 20" each occassional UE assist 2# hip abduction 2 x 10 2# hip extension 2 x 10 2# marching 2 x 10   06/15/23 Sit to stand x 10 Standing: March x 10  Squat x 10 Tandem stance x 5 with Rt in front x 5 with left in front.   Supine bridge x 10 SLR x 10 Knee to chest 30 x 3"  Side lying  hip abduction x 10 B 06/10/23 Evaluation and patient education    PATIENT EDUCATION: Education details: Educated on the goals and course of rehab. Education on measures to reduce falls at home. Person educated: Patient and Spouse Education method: Explanation Education comprehension: verbal cues required  HOME EXERCISE PROGRAM: 06/15/23 Access Code: [email or web address] URL: [email or web address] Date: 06/15/2023 Prepared by: Virgina Organ  Exercises - Supine Bridge  - 1 x daily - 7 x weekly - 1 sets - 10 reps - 5" hold - Heel Raises with Counter Support  - 1 x daily - 7 x weekly - 1 sets - 10 reps - 5" hold - Standing March with Counter Support  - 1 x daily - 7 x weekly - 1 sets - 10 reps - 5" hold - Mini Squat with Counter Support  - 1 x daily - 7 x weekly - 1 sets - 10 reps - 5" hold - Sit to Stand Without Arm Support  - 1 x daily - 7 x weekly - 1 sets - 10 reps  07/03/23: - Single Leg Stance  - 2 x daily - 7 x weekly - 2 sets - 3 reps - 30" hold - Standing 3-Way Kick  - 2 x daily - 7 x weekly - 1 sets - 5 reps - 5" hold - Side Stepping with Resistance at Thighs  - 1 x daily - 7 x weekly - 1 sets - 5 reps  GOALS:  Goals reviewed with  patient? Yes  SHORT TERM GOALS: Target date: 06/24/23  Pt will demonstrate indep in HEP to facilitate carry-over of skilled services and improve functional outcomes Goal status: on-going; MET  LONG TERM GOALS: Target date: 07/08/23  Pt will decrease 5TSTS by at least 3 seconds in order to demonstrate clinically significant improvement in LE strength  Baseline: 18.30 sec: 07/09/23:  14.88" no HHA Goal status: MET  2.  Pt will improve DGI by at least 3 points in order to demonstrate clinically significant improvement in balance and decreased risk for falls  Baseline: 15; 07/09/23:  23/24 Goal status: MET  3.  Patient will demonstrate increase in Tinetti Score by 3 points in order to demonstrate clinically significant improvement in balance and decreased risk for falls Baseline: 19; 07/09/23:  28/28 Goal status: MET  4.  Pt will increase by at least 40 ft in order to demonstrate clinically significant improvement in community ambulation Baseline: 308 ft: 07/09/23:  486 Goal status: MET  5.  Pt will increase LEFS by at least 9 points in order to demonstrate significant improvement in lower extremity function.  Baseline: 51; 07/09/23: 65/80 Goal status: MET  6.  Pt will demonstrate increase in LE strength to 4+/5 to facilitate ease and safety in ambulation Baseline: 2+/5; 07/09/23:  see above Goal status: on-going  ASSESSMENT:  CLINICAL IMPRESSION: Patient with less fatigue reported today.  Able to tolerate increased exercise load without issue and no loss of balance or path deviation noted with walking in the PT gym today.  Patient with max challenge with heel toe walking; needs CGA occasional min A for maintaining balance.  PT will continue to benefit from skilled PT to address his functional impairments and limitations.   Reviewed goals and pt is progressing well.  Pt has met 1/1 STG and 5/6 LTGs.  Reports of compliance and walking program.  Presents with improvements with  objective findings including:  Increased cadence with , improved gait mechanics and balance noted with Tinetti testing and improved self perceived functional abilities wth LEFS score improvements.  Pt continues to present with weakness especially in hip musculature, see MMT.  PT will continue to benefit from skilled PT to address his functional impairments and limitations.   OBJECTIVE IMPAIRMENTS: Abnormal gait, decreased activity tolerance, decreased balance, difficulty walking, decreased strength, and impaired flexibility.   ACTIVITY LIMITATIONS: carrying, lifting, bending, standing, squatting, stairs, and transfers  PARTICIPATION LIMITATIONS: meal prep, cleaning, laundry, driving, shopping, community activity, and yard work  PERSONAL FACTORS: Age are also affecting patient's functional outcome.   REHAB POTENTIAL: Good  CLINICAL DECISION MAKING: Stable/uncomplicated  EVALUATION COMPLEXITY: Low  PLAN:  PT FREQUENCY: 1x/week  PT DURATION: 4 weeks  PLANNED INTERVENTIONS: Therapeutic exercises, Therapeutic activity, Neuromuscular re-education, Balance training, Gait training, Patient/Family education, Self Care, Stair training, and Manual therapy  PLAN FOR NEXT SESSION: Progress LE flexibility, balance, gait and strengthening activities.   12:36 PM, 07/23/23 Gavrielle Streck Small [place] MPT [place] [place] [phone]

## 2023-07-24 ENCOUNTER — Other Ambulatory Visit (HOSPITAL_COMMUNITY): Payer: Self-pay

## 2023-07-24 ENCOUNTER — Other Ambulatory Visit: Payer: Self-pay

## 2023-07-24 ENCOUNTER — Ambulatory Visit
Admission: RE | Admit: 2023-07-24 | Discharge: 2023-07-24 | Disposition: A | Discharge status: Home / Self Care | Payer: Medicare Other | Source: Ambulatory Visit | Attending: Radiation Oncology

## 2023-07-24 DIAGNOSIS — H01112 Allergic dermatitis of right lower eyelid: Secondary | ICD-10-CM | POA: Diagnosis not present

## 2023-07-24 DIAGNOSIS — Z510 Encounter for antineoplastic radiation therapy: Secondary | ICD-10-CM | POA: Diagnosis not present

## 2023-07-24 DIAGNOSIS — H01115 Allergic dermatitis of left lower eyelid: Secondary | ICD-10-CM | POA: Diagnosis not present

## 2023-07-24 DIAGNOSIS — H43813 Vitreous degeneration, bilateral: Secondary | ICD-10-CM | POA: Diagnosis not present

## 2023-07-24 DIAGNOSIS — C711 Malignant neoplasm of frontal lobe: Secondary | ICD-10-CM | POA: Diagnosis not present

## 2023-07-24 DIAGNOSIS — D3132 Benign neoplasm of left choroid: Secondary | ICD-10-CM | POA: Diagnosis not present

## 2023-07-24 LAB — RAD ONC ARIA SESSION SUMMARY
Course Elapsed Days: 36
Plan Fractions Treated to Date: 3
Plan Prescribed Dose Per Fraction: 2 Gy
Plan Total Fractions Prescribed: 7
Plan Total Prescribed Dose: 14 Gy
Reference Point Dosage Given to Date: 6 Gy
Reference Point Session Dosage Given: 2 Gy
Session Number: 26

## 2023-07-24 MED ORDER — MAXITROL 3.5-10000-0.1 OP OINT
1.0000 | TOPICAL_OINTMENT | Freq: Three times a day (TID) | OPHTHALMIC | 1 refills | Status: DC
  Filled 2023-07-24: qty 3.5, 3d supply, fill #0
  Filled 2023-07-28: qty 3.5, 3d supply, fill #1

## 2023-07-27 ENCOUNTER — Ambulatory Visit
Admission: RE | Admit: 2023-07-27 | Discharge: 2023-07-27 | Disposition: A | Discharge status: Home / Self Care | Payer: Medicare Other | Source: Ambulatory Visit | Attending: Radiation Oncology | Admitting: Radiation Oncology

## 2023-07-27 ENCOUNTER — Other Ambulatory Visit: Payer: Self-pay

## 2023-07-27 ENCOUNTER — Ambulatory Visit
Admission: RE | Admit: 2023-07-27 | Discharge: 2023-07-27 | Disposition: A | Discharge status: Home / Self Care | Payer: Medicare Other | Source: Ambulatory Visit | Attending: Radiation Oncology

## 2023-07-27 DIAGNOSIS — Z510 Encounter for antineoplastic radiation therapy: Secondary | ICD-10-CM | POA: Diagnosis not present

## 2023-07-27 DIAGNOSIS — C711 Malignant neoplasm of frontal lobe: Secondary | ICD-10-CM | POA: Diagnosis not present

## 2023-07-27 LAB — RAD ONC ARIA SESSION SUMMARY
Course Elapsed Days: 39
Plan Fractions Treated to Date: 4
Plan Prescribed Dose Per Fraction: 2 Gy
Plan Total Fractions Prescribed: 7
Plan Total Prescribed Dose: 14 Gy
Reference Point Dosage Given to Date: 8 Gy
Reference Point Session Dosage Given: 2 Gy
Session Number: 27

## 2023-07-28 ENCOUNTER — Encounter (HOSPITAL_COMMUNITY): Payer: Medicare Other | Admitting: Speech Pathology

## 2023-07-28 ENCOUNTER — Ambulatory Visit
Admission: RE | Admit: 2023-07-28 | Discharge: 2023-07-28 | Disposition: A | Discharge status: Home / Self Care | Payer: Medicare Other | Source: Ambulatory Visit | Attending: Radiation Oncology | Admitting: Radiation Oncology

## 2023-07-28 ENCOUNTER — Other Ambulatory Visit: Payer: Self-pay

## 2023-07-28 ENCOUNTER — Encounter (HOSPITAL_COMMUNITY): Payer: Medicare Other | Admitting: Occupational Therapy

## 2023-07-28 ENCOUNTER — Ambulatory Visit (HOSPITAL_COMMUNITY): Payer: Medicare Other | Admitting: Speech Pathology

## 2023-07-28 ENCOUNTER — Encounter (HOSPITAL_COMMUNITY): Payer: Self-pay | Admitting: Speech Pathology

## 2023-07-28 DIAGNOSIS — R29898 Other symptoms and signs involving the musculoskeletal system: Secondary | ICD-10-CM | POA: Diagnosis not present

## 2023-07-28 DIAGNOSIS — R41841 Cognitive communication deficit: Secondary | ICD-10-CM

## 2023-07-28 DIAGNOSIS — C711 Malignant neoplasm of frontal lobe: Secondary | ICD-10-CM | POA: Diagnosis not present

## 2023-07-28 DIAGNOSIS — R29818 Other symptoms and signs involving the nervous system: Secondary | ICD-10-CM | POA: Diagnosis not present

## 2023-07-28 DIAGNOSIS — R262 Difficulty in walking, not elsewhere classified: Secondary | ICD-10-CM | POA: Diagnosis not present

## 2023-07-28 DIAGNOSIS — R278 Other lack of coordination: Secondary | ICD-10-CM | POA: Diagnosis not present

## 2023-07-28 DIAGNOSIS — Z510 Encounter for antineoplastic radiation therapy: Secondary | ICD-10-CM | POA: Diagnosis not present

## 2023-07-28 LAB — RAD ONC ARIA SESSION SUMMARY
Course Elapsed Days: 40
Plan Fractions Treated to Date: 5
Plan Prescribed Dose Per Fraction: 2 Gy
Plan Total Fractions Prescribed: 7
Plan Total Prescribed Dose: 14 Gy
Reference Point Dosage Given to Date: 10 Gy
Reference Point Session Dosage Given: 2 Gy
Session Number: 28

## 2023-07-28 NOTE — Therapy (Signed)
OUTPATIENT SPEECH LANGUAGE PATHOLOGY TREATMENT   Patient Name: Alex Church MRN: [id number] DOB:1945/11/08, 77 y.o., male Today's Date: 07/28/2023  PCP: Wanda Plump, MD REFERRING PROVIDER: Jacquelynn Cree, PA-C  END OF SESSION:  End of Session - 07/28/23 1446     Visit Number 2    Number of Visits 6    Date for SLP Re-Evaluation 07/28/23    Authorization Type [employer] Medicare    SLP Start Time 1438    SLP Stop Time  1517    SLP Time Calculation (min) 39 min             Past Medical History:  Diagnosis Date   B12 deficiency    monthly shots   Diabetes mellitus    Glaucoma suspect    Glioblastoma (HCC)    Hyperlipemia    Hypertension    Past Surgical History:  Procedure Laterality Date   APPLICATION OF CRANIAL NAVIGATION Right 05/12/2023   Procedure: APPLICATION OF CRANIAL NAVIGATION;  Surgeon: Bethann Goo, DO;  Location: [facility] OR;  Service: Neurosurgery;  Laterality: Right;   CRANIOTOMY Right 05/12/2023   Procedure: RIGHT STERIOSTACTIC FRONTAL CRANIOTOMY FOR TUMOR RESECTION;  Surgeon: Bethann Goo, DO;  Location: [facility] OR;  Service: Neurosurgery;  Laterality: Right;   Patient Active Problem List   Diagnosis Date Noted   Seizure (HCC) 07/12/2023   Hypomagnesemia 07/12/2023   Myocardial injury 07/12/2023   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   Glioblastoma, 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   PVC (premature ventricular contraction) 08/27/2022   Glaucoma suspect 04/30/2020   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   Annual physical exam 06/30/2016   PCP NOTES >>>>>>>>>>>>>>>>>>>>> 02/25/2016   DM2 (diabetes mellitus, type 2) (HCC) 07/06/2013   B12 deficiency 07/06/2013   CAROTID BRUIT, RIGHT 10/01/2010   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 male was admitted to [facility] on 05/05/2023 by way of transfer from [facility] with new diagnosis of right frontal brain mass after presenting from home to [facility] ED complaining of left-sided weakness. The patient presented with complaint of 4 days of left-sided weakness involving the left upper and lower extremity, starting on Saturday, 05/02/2023. Denied any associated acute focal numbness or paresthesias nor any acute vertigo, dysphagia, dysarthria, facial droop, acute change in vision. The patient's family also conveyed that the patient had appeared progressively confused over the last few months, starting in May 2024. Over that time, they had noticed the patient becoming more forgetful, getting lost while driving, although very familiar with these routes. MRI brain with and without contrast showed large mass located within the right frontal lobe measuring 6.8 x 4.9 cm associate with mass effect on the right lateral ventricle and 6 mm of leftward midline shift in the absence of any evidence of acute infarct in the absence of any evidence of intracranial hemorrhage. EDP at [facility] d/w on-call neurosurgeon, Dr. Jake Samples , who recommended TRH admission to [facility]. Dr. Jake Samples conveyed that neurosurgery would formally consult, and did not feel that emergent surgery is indicated. Rather, neurosurgery anticipated surgical  intervention on Thursday, 05/07/2023, and recommends interval Decadron 4 mg every 6 hours. The patient underwent right stereotactic frontal craniotomy for resection of tumor by Dr. Jake Samples on 05/12/2023. He was in inpatient rehab from 05/15/23-05/23/23 and discharged home with his wife. He was referred for outpatient SLP services by  Delle Reining, PA-C.  PAIN:  Are you having pain? No   PATIENT GOALS: Improve memory  OBJECTIVE:   DIAGNOSTIC FINDINGS: MRI 05/13/2023 Expected postoperative findings after debulking of a large right frontal mass, detailed above.  COGNITION: Overall cognitive status: Impaired Areas of impairment:  Attention: Impaired: Sustained Memory: Impaired: Short term Executive function: Impaired: Planning Functional deficits: Pt with mild working memory and attention deficits. He recalled 4/5 objects in delayed recall task. His wife has always handled his medications at home and continues to do so now.   AUDITORY COMPREHENSION: Overall auditory comprehension: Appears intact YES/NO questions: Appears intact Following directions: Appears intact Conversation: Complex Interfering components: attention Effective technique: repetition/stressing words  READING COMPREHENSION: Not assessed  EXPRESSION: verbal  VERBAL EXPRESSION: Level of generative/spontaneous verbalization: conversation Automatic speech: name: intact and social response: intact  Repetition: Appears intact Naming: Responsive: 76-100%, Confrontation: 76-100%, and Divergent: 76-100% Pragmatics: Appears intact Comments: Expresses complex thoughts independently Interfering components:  N/A Effective technique:  N/A Non-verbal means of communication: N/A  WRITTEN EXPRESSION: Dominant hand: right Written expression: Not tested  MOTOR SPEECH: Overall motor speech: Appears intact Level of impairment: Conversation Respiration: thoracic breathing Phonation: normal Resonance: WFL Articulation: Appears intact Intelligibility: Intelligible Motor planning: Appears intact Motor speech errors:  N/A Interfering components:  N/A Effective technique:  N/A  ORAL MOTOR EXAMINATION: Overall status: WFL Comments: N/A  STANDARDIZED ASSESSMENTS: SLUMS: 27/30   TODAY'S TREATMENT:      Pt was not seen since his initial evaluation  06/01/23 due to radiation treatment and re-hospitalization for seizure. He reports that he is doing quite well regarding cognitive linguistic status and mostly reports fatigue. He completed an array of memory tasks in session involving recall with a distractor, recognition tasks, working memory, and varied use of strategies if needed. He completed all exercises with 96% acc without cues and 100% with use of strategies (writing down and rehearsal). Pt reports independence with all cognitive communication, attention, memory, and planning tasks. He is unable to drive at this time due to being on a seizure medication. He indicates that he primarily could use physical therapy and is pleased with his current level of function from cog/ling standpoint. Will d/c Pt from SLP services at this time.                                                                                                                                 DATE: 07/28/23  PATIENT EDUCATION: Education details: Plan for short term SLP services to address attention and memory deficits, will need to work around radiation schedule Person educated: Patient and Spouse Education method: Explanation Education comprehension: verbalized  understanding   GOALS: Goals reviewed with patient? Yes  SHORT TERM GOALS: Target date: 07/09/2023  Pt will implement memory strategies in functional therapy activities with 90% acc with min cues.  Baseline: 80% Goal status: MET  2.  Pt will complete selective and alternating attention tasks (moderately complex) with 90% acc with use of strategies and min cues.  Baseline: 80% Goal status: MET  3.  Pt will complete moderate-level thought organization and planning activities with 90% acc and min assist Baseline: mi/mod assist and extra time provided Goal status: MET   LONG TERM GOALS: Same as short term goals  ASSESSMENT:  CLINICAL IMPRESSION: Patient is a 77 y.o. male who was seen today for a cognitive  linguistic evaluation s/p Right stereotactic frontal craniotomy for resection of tumor on 05/12/2023. Pt and spouse report improvement in previously noted cognitive deficits before brain tumor was removed. 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 His wife is responsible for medication management and bill paying. Pt is active with cooking, driving, cleaning, working in the yard, and interacting with his dog. Wife notes that he became less interested in activities he previously enjoyed, got lost at work, and fell at home before the tumor was identified and removed. Pt reports only mild residual memory changes now and decreased energy. He scored a 27/30 on the [facility] with errors in memory and clock drawing. He indicated that he would like to be able to drive again and Trails A was administered and he completed in 122 seconds (deficient is >78 seconds) and Trails B in 217 seconds (deficient is >273 seconds). Recommend short course of SLP therapy to address mild attention, memory, and executive functioning deficits, however this will likely be complicated by plan for radiation therapy Monday through Friday for 6 weeks. Will plan to schedule whenever convenient for Pt and his wife.   OBJECTIVE IMPAIRMENTS: include attention, memory, and executive functioning. These impairments are limiting patient from return to work and managing medications. Factors affecting potential to achieve goals and functional outcome are ability to learn/carryover information. Patient will benefit from skilled SLP services to address above impairments and improve overall function.  REHAB POTENTIAL: Excellent  PLAN:  SPEECH THERAPY DISCHARGE SUMMARY  Visits from Start of Care: 2  Current functional level related to goals / functional outcomes: See above   Remaining deficits: N/A   Education / Equipment: Complete   Patient agrees to discharge. Patient goals were  met. Patient is being discharged due to being pleased with the current functional level..      Thank you,  Havery Moros, CCC-SLP [phone]  Denim Kalmbach, CCC-SLP 07/28/2023, 3:18 PM

## 2023-07-29 ENCOUNTER — Other Ambulatory Visit: Payer: Self-pay

## 2023-07-29 ENCOUNTER — Ambulatory Visit: Payer: Medicare Other

## 2023-07-29 ENCOUNTER — Other Ambulatory Visit (HOSPITAL_COMMUNITY): Payer: Self-pay

## 2023-07-29 ENCOUNTER — Other Ambulatory Visit: Payer: Self-pay | Admitting: Internal Medicine

## 2023-07-29 ENCOUNTER — Ambulatory Visit
Admission: RE | Admit: 2023-07-29 | Discharge: 2023-07-29 | Disposition: A | Discharge status: Home / Self Care | Payer: Medicare Other | Source: Ambulatory Visit | Attending: Radiation Oncology

## 2023-07-29 ENCOUNTER — Ambulatory Visit (HOSPITAL_COMMUNITY): Payer: Medicare Other

## 2023-07-29 DIAGNOSIS — R29898 Other symptoms and signs involving the musculoskeletal system: Secondary | ICD-10-CM | POA: Diagnosis not present

## 2023-07-29 DIAGNOSIS — C711 Malignant neoplasm of frontal lobe: Secondary | ICD-10-CM | POA: Diagnosis not present

## 2023-07-29 DIAGNOSIS — R29818 Other symptoms and signs involving the nervous system: Secondary | ICD-10-CM

## 2023-07-29 DIAGNOSIS — R262 Difficulty in walking, not elsewhere classified: Secondary | ICD-10-CM | POA: Diagnosis not present

## 2023-07-29 DIAGNOSIS — R278 Other lack of coordination: Secondary | ICD-10-CM

## 2023-07-29 DIAGNOSIS — R41841 Cognitive communication deficit: Secondary | ICD-10-CM | POA: Diagnosis not present

## 2023-07-29 DIAGNOSIS — Z510 Encounter for antineoplastic radiation therapy: Secondary | ICD-10-CM | POA: Diagnosis not present

## 2023-07-29 LAB — RAD ONC ARIA SESSION SUMMARY
Course Elapsed Days: 41
Plan Fractions Treated to Date: 6
Plan Prescribed Dose Per Fraction: 2 Gy
Plan Total Fractions Prescribed: 7
Plan Total Prescribed Dose: 14 Gy
Reference Point Dosage Given to Date: 12 Gy
Reference Point Session Dosage Given: 2 Gy
Session Number: 29

## 2023-07-29 MED ORDER — METFORMIN HCL 1000 MG PO TABS
500.0000 mg | ORAL_TABLET | Freq: Two times a day (BID) | ORAL | 1 refills | Status: DC
  Filled 2023-07-29: qty 90, 90d supply, fill #0

## 2023-07-29 NOTE — Therapy (Cosign Needed)
OUTPATIENT PHYSICAL THERAPY NEURO Treatment    Patient Name: Alex Church MRN: [id number] DOB:07-31-1946, 77 y.o., male Today's Date: 07/29/2023   PCP: Wanda Plump, MD REFERRING PROVIDER: Jacquelynn Cree, PA-C  END OF SESSION:  PT End of Session - 07/29/23 1345     Visit Number 10    Number of Visits 15    Date for PT Re-Evaluation 08/06/23    Authorization Type [employer] Medicare    Progress Note Due on Visit 18    PT Start Time 1345    PT Stop Time 1430    PT Time Calculation (min) 45 min    Activity Tolerance Patient tolerated treatment well    Behavior During Therapy WFL for tasks assessed/performed               Past Medical History:  Diagnosis Date   B12 deficiency    monthly shots   Diabetes mellitus    Glaucoma suspect    Glioblastoma (HCC)    Hyperlipemia    Hypertension    Past Surgical History:  Procedure Laterality Date   APPLICATION OF CRANIAL NAVIGATION Right 05/12/2023   Procedure: APPLICATION OF CRANIAL NAVIGATION;  Surgeon: Bethann Goo, DO;  Location: [facility] OR;  Service: Neurosurgery;  Laterality: Right;   CRANIOTOMY Right 05/12/2023   Procedure: RIGHT STERIOSTACTIC FRONTAL CRANIOTOMY FOR TUMOR RESECTION;  Surgeon: Bethann Goo, DO;  Location: [facility] OR;  Service: Neurosurgery;  Laterality: Right;   Patient Active Problem List   Diagnosis Date Noted   Seizure (HCC) 07/12/2023   Hypomagnesemia 07/12/2023   Myocardial injury 07/12/2023   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   Glioblastoma, 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   PVC (premature ventricular contraction) 08/27/2022   Glaucoma suspect 04/30/2020   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   Annual physical exam 06/30/2016   PCP NOTES >>>>>>>>>>>>>>>>>>>>> 02/25/2016   DM2 (diabetes mellitus, type 2) (HCC) 07/06/2013   B12 deficiency 07/06/2013   CAROTID BRUIT, RIGHT 10/01/2010   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 Bad days are mainly due to fatigue. Today he is doing well with no pain. He was able to walk a mile yesterday. His last day of radiation is tomorrow 07/30/23.   PERTINENT HISTORY: PVC  PAIN:  Are you having pain? No  PRECAUTIONS: Fall  RED FLAGS: Bowel or bladder incontinence: Yes: recently diagnosed with UTI    WEIGHT BEARING RESTRICTIONS: No  FALLS: Has patient fallen in last 6 months? Yes. Number of falls 2 (most recent was in August 2024)  LIVING ENVIRONMENT: Lives with: lives with their spouse Lives in: House/apartment Stairs: Yes: Internal: 12-13 steps; can reach both and External: 5 steps; on right going up Has following equipment at home: Single point cane, Walker - 2 wheeled, and Wheelchair (manual)  PLOF: Independent and Independent with basic ADLs  PATIENT GOALS: Wife wishes for the patient's legs to be strong again  OBJECTIVE:  Note: Objective measures were completed at Evaluation unless otherwise noted.  DIAGNOSTIC FINDINGS:  05/13/23 MRI HEAD WITHOUT AND WITH CONTRAST   TECHNIQUE: Multiplanar, multiecho pulse sequences of the brain and surrounding structures were obtained without and with intravenous contrast.   CONTRAST:  8mL GADAVIST GADOBUTROL 1 MMOL/ML IV SOLN   COMPARISON:  MRI head 05/05/2023.    FINDINGS: Brain: Interval debulking of a right frontal lesion appears larger section cavity with expected postoperative blood products in the resection cavity as well as overlying extra-axial spaces. Small to moderate volume of pneumocephalus antidependently along the frontal convexities anteriorly. Surrounding T2/FLAIR hyperintensities is mildly improved and mass effect is mildly improved. Leftward midline shift now measures approximately 4 mm anteriorly. No suspicious residual enhancement, although postoperative left limits assessment. Mm continued attention on follow-up.   No evidence of acute infarct or hydrocephalus.   Vascular: Major arterial flow voids are maintained at the skull base.   Skull and upper cervical spine: Normal marrow signal.   Sinuses/Orbits: Clear sinuses.  No acute orbital findings.   Other: No mastoid effusions.   IMPRESSION: Expected postoperative findings after debulking of a large right frontal mass, detailed above.MRI HEAD WITHOUT AND WITH CONTRAST   TECHNIQUE: Multiplanar, multiecho pulse sequences of the brain and surrounding structures were obtained without and with intravenous contrast.   CONTRAST:  8mL GADAVIST GADOBUTROL 1 MMOL/ML IV SOLN   COMPARISON:  MRI head 05/05/2023.   FINDINGS: Brain: Interval debulking of a right frontal lesion appears larger section cavity with expected postoperative blood products in the resection cavity as well as overlying extra-axial spaces. Small to moderate volume of pneumocephalus antidependently along the frontal convexities anteriorly. Surrounding T2/FLAIR hyperintensities is mildly improved and mass effect is mildly improved. Leftward midline shift now measures approximately 4 mm anteriorly. No suspicious residual enhancement, although postoperative left limits assessment. Mm continued attention on follow-up.   No evidence of acute infarct or hydrocephalus.   Vascular: Major arterial flow voids  are maintained at the skull base.   Skull and upper cervical spine: Normal marrow signal.   Sinuses/Orbits: Clear sinuses.  No acute orbital findings.   Other: No mastoid effusions.   IMPRESSION: Expected postoperative findings after debulking of a large right frontal mass, detailed above.  05/05/23 MRI HEAD WITHOUT AND WITH CONTRAST   TECHNIQUE: Multiplanar, multiecho pulse sequences of the brain and surrounding structures were obtained without and with intravenous contrast.   CONTRAST:  9mL GADAVIST GADOBUTROL 1 MMOL/ML IV SOLN   COMPARISON:  05/05/2023 CTA head neck  FINDINGS: Brain: There is a large, predominantly peripherally contrast-enhancing mass located within the right frontal lobe, measuring 6.8 x 4.9 cm. There is a large amount of surrounding hyperintense T2-weighted signal. There is marked mass effect on the right lateral ventricle and leftward midline shift 6 mm. There is early subfalcine herniation of the cingulate gyrus. No acute hemorrhage. The central necrotic component of the mass does not restrict diffusion.   Vascular: Major flow voids are preserved.   Skull and upper cervical spine: Normal calvarium and skull base. Visualized upper cervical spine and soft tissues are normal.   Sinuses/Orbits:No paranasal sinus fluid levels or advanced mucosal thickening. No mastoid or middle ear effusion. Normal orbits.   IMPRESSION: 1. Large, predominantly peripherally contrast-enhancing mass located within the right frontal lobe, measuring 6.8 x 4.9 cm. Large amount of surrounding hyperintense T2-weighted signal. This is most consistent with a high-grade glioma. 2. Marked mass effect on the right lateral ventricle and 6 mm leftward midline shift. Early subfalcine herniation of the cingulate gyrus.  COGNITION: Overall cognitive status: Within functional limits for tasks assessed   SENSATION: WFL  COORDINATION: Heel to shin: intact and WFL on  B Alternating foot taps: intact and WFL  MUSCLE TONE: normal  MUSCLE LENGTH: Hamstrings: moderate restriction on B Gastrocsoleus: moderate restriction on B  POSTURE: Standing: rounded shoulders, forward head, and decreased lumbar lordosis  LOWER EXTREMITY ROM:     Active  Right Eval Left Eval  Hip flexion [facility]  Hip extension [facility]  Hip abduction [facility]  Hip adduction    Hip internal rotation    Hip external rotation    Knee flexion [facility]  Knee extension [facility]  Ankle dorsiflexion [facility]  Ankle plantarflexion [facility] WFL  Ankle inversion    Ankle eversion     (Blank rows = not tested)  LOWER EXTREMITY MMT:    MMT Right Eval Left Eval Right 07/09/23 Left 07/09/23  Hip flexion 3+ 3+ 4 4-  Hip extension 2+ 2+ 3- 3-  Hip abduction 4- 3+ 4 4-  Hip adduction      Hip internal rotation      Hip external rotation      Knee flexion 4+ 4+ 5 4+  Knee extension 4+ 3+ 4+ 4+  Ankle dorsiflexion 4+ 4+ 4+ 4+  Ankle plantarflexion 4+ 4+ 4+ 4+  Ankle inversion      Ankle eversion      (Blank rows = not tested)  BED MOBILITY:  Sit to supine Complete Independence Supine to sit Complete Independence Rolling to Right Complete Independence Rolling to Left Complete Independence  TRANSFERS: Assistive device utilized: Single point cane  Sit to stand: Modified independence Stand to sit: Modified independence  STAIRS: Level of Assistance: Modified independence Stair Negotiation Technique: Alternating Pattern  with Bilateral Rails Number of Stairs: 4  Height of Stairs: 7"  Comments: Little to no unsteadiness noted  GAIT: Gait pattern: decreased hip/knee flexion- Right, decreased hip/knee flexion- Left, and trendelenburg Distance walked: 308 ft Assistive device utilized: Single point cane Level of assistance: Modified independence Comments: done during  FUNCTIONAL TESTS:  5 times sit to stand: 18.30 sec 2 minute walk test: 308 ft Dynamic Gait Index:  15 Tinetti (balance + gait) = 19   DGI 1. Gait level surface (2) Mild Impairment: Walks 20', uses assistive devices, slower speed, mild gait deviations. 2. Change in gait speed (3) Normal: Able to smoothly change walking speed without loss of balance or  gait deviation. Shows a significant difference in walking speeds between normal, fast and slow speeds. 3. Gait with horizontal head turns (1) Moderate Impairment: Performs head turns with moderate change in gait velocity, slows down, staggers but recovers, can continue to walk. 4. Dellia Nims with vertical head turns  1) Moderate Impairment: Performs head turns with moderate change in gait velocity, slows down, staggers but recovers, can continue to walk. 5. Gait and pivot turn (2) Mild Impairment: Pivot turns safely in > 3 seconds and stops with no loss of balance. 6. Step over obstacle (2) Mild Impairment: Is able to step over box, but must slow down and adjust steps to clear box safely. 7. Step around obstacles (2) Mild Impairment: Is able to step around both cones, but must slow down and adjust steps to clear cones. 8. Stairs (2) Mild Impairment: Alternating feet, must use rail.  TOTAL SCORE: 15 / 24   PATIENT SURVEYS:  LEFS 51/80 = 63.7% 07/08/20: LEFS 65/80= 81.2%  TODAY'S TREATMENT:                                                                                                                              DATE:  07/29/23 Nustep L3 seat 10; x5' UE/LE Standing: Heel raises  2 x 20 Toe raises 2 x 20 Toe walking 2 laps in parallel bars  Single leg march on foam pad, 1 UE assist, 2x10, 3# AW  3# hip extension 2 x 15  3# hip abduction 2 x 15 6" box mini lunge x 15 each, UE slide across bars 6" box step up minimal UE assist x15 each Seated:  Marches with 3# AW 2x10  07/23/23  Nustep L3 seat 10x 5' UE/LE Standing: Heel raises x 20 Toe raises x 20 Marching no UE x 10 each Sit to stand x 10 6" box mini lunge x 10 no UE  assist 6" box step up minimal UE assist x 10 each 3# hip extension 2 x 10 3# hip abduction 2 x 10 Heel toe walking on 20' line with CGA x 3  Seated #3 LAQs 2 x 10 each  07/15/23 Nustep L5 seat 10x 5' UE/LE Standing: Heel raises x 20 Tandem stance 2 x 30" each 4" step ups no UE assist 3# marching with 1 UE assist 2 x 10  Seated: 3# LAQs 2 x 10 each   07/09/23: 5 times sit to stand: 18.30 sec; 07/09/23: 14.88" 2 minute walk test: eval: 308 ft; 07/09/23: 468 ft Dynamic Gait Index: 15; 07/09/23:  23/24 Tinetti (balance + gait) = 19 07/09/23: 28/28 LEFS 65/80=  DGI 1. Gait level surface (3) Normal: Walks 20', no assistive devices, good sped, no evidence for imbalance, normal gait pattern 2. Change in gait speed (3) Normal: Able to smoothly change walking speed without loss of balance or gait deviation. Shows a significant difference in walking speeds between normal, fast and slow speeds. 3. Gait with horizontal head turns (  3) Normal: Performs head turns smoothly with no change in gait. 4. Gait with vertical head turns (3) Normal: Performs head turns smoothly with no change in gait. 5. Gait and pivot turn (3) Normal: Pivot turns safely within 3 seconds and stops quickly with no loss of balance. 6. Step over obstacle (3) Normal: Is able to step over the box without changing gait speed, no evidence of imbalance. 7. Step around obstacles (3) Normal: Is able to walk around cones safely without changing gait speed; no evidence of imbalance. 8. Stairs (2) Mild Impairment: Alternating feet, must use rail.  TOTAL SCORE: 23 / 24  MMT see above  07/03/23: Standing: Sit to stand slow control then power up to heel raise no HHA 15x Functional squat 12 reps SLS Lt 4", Rt 2" max of 3 Vector stance 3x 5" with 1 HHA Tandem stance 1x 30" on solid floor; 2x 30" on foam Sidestep GTB 5RT inside // bars minimal HHA Stairs 1 HR 3RT 7in step height Nustep L5 seat 10x 5'  UE/LE   06/30/23 Nustep level 4 seat 10;  5 minutes UE/LE Standing: Heelraises on incline x 20 light UE assist Toe raises on decline x20 light UE assist Lunges onto 4" no UE assist 2X10 each 6" forward step ups 20X each LE 6" lateral step ups 20X each LE Vectors 5X 3" each with 1 UE assist 3# hip abduction 2 x 10 3# hip extension 2 x 10 3# marching 2 x 10 Sit to stand 2 x 5 no UE assist  06/25/23 Nustep level 4 seat 12;  5 minutes Standing: Heelraises on incline x 20 light UE assist Toe raises on decline x 10 light UE assist Tandem stance 2 x 30" sec no UE assist  Lunges onto 4" no UE assist 2X10 each Vectors 5X 3" each with 1 UE assist SLS x 20" each occassional UE assist 2# hip abduction 2 x 10 2# hip extension 2 x 10 2# marching 2 x 10 Sit to stand 2 x 5 no UE assist  06/23/23 Standing: Heels raises on incline x 20 light UE assist Toe raises on decline x 10 light UE assist Sit to stand 2 x 5 no UE assist Tandem stance 2 x 30" sec no UE assist SLS x 20" each occassional UE assist 2# hip abduction 2 x 10 2# hip extension 2 x 10 2# marching 2 x 10   06/15/23 Sit to stand x 10 Standing: March x 10  Squat x 10 Tandem stance x 5 with Rt in front x 5 with left in front.   Supine bridge x 10 SLR x 10 Knee to chest 30 x 3"  Side lying  hip abduction x 10 B 06/10/23 Evaluation and patient education    PATIENT EDUCATION: Education details: Educated on the goals and course of rehab. Education on measures to reduce falls at home. Person educated: Patient and Spouse Education method: Explanation Education comprehension: verbal cues required  HOME EXERCISE PROGRAM: 06/15/23 Access Code: [email or web address] URL: [email or web address] Date: 06/15/2023 Prepared by: Virgina Organ  Exercises - Supine Bridge  - 1 x daily - 7 x weekly - 1 sets - 10 reps - 5" hold - Heel Raises with Counter Support  - 1 x daily - 7 x weekly - 1 sets - 10 reps - 5" hold - Standing  March with Counter Support  - 1 x daily - 7 x weekly - 1 sets - 10 reps - 5" hold - Mini  Squat with Counter Support  - 1 x daily - 7 x weekly - 1 sets - 10 reps - 5" hold - Sit to Stand Without Arm Support  - 1 x daily - 7 x weekly - 1 sets - 10 reps  07/03/23: - Single Leg Stance  - 2 x daily - 7 x weekly - 2 sets - 3 reps - 30" hold - Standing 3-Way Kick  - 2 x daily - 7 x weekly - 1 sets - 5 reps - 5" hold - Side Stepping with Resistance at Thighs  - 1 x daily - 7 x weekly - 1 sets - 5 reps  GOALS: Goals reviewed with patient? Yes  SHORT TERM GOALS: Target date: 06/24/23  Pt will demonstrate indep in HEP to facilitate carry-over of skilled services and improve functional outcomes Goal status: on-going; MET  LONG TERM GOALS: Target date: 07/08/23  Pt will decrease 5TSTS by at least 3 seconds in order to demonstrate clinically significant improvement in LE strength  Baseline: 18.30 sec: 07/09/23:  14.88" no HHA Goal status: MET  2.  Pt will improve DGI by at least 3 points in order to demonstrate clinically significant improvement in balance and decreased risk for falls  Baseline: 15; 07/09/23:  23/24 Goal status: MET  3.  Patient will demonstrate increase in Tinetti Score by 3 points in order to demonstrate clinically significant improvement in balance and decreased risk for falls Baseline: 19; 07/09/23:  28/28 Goal status: MET  4.  Pt will increase by at least 40 ft in order to demonstrate clinically significant improvement in community ambulation Baseline: 308 ft: 07/09/23:  486 Goal status: MET  5.  Pt will increase LEFS by at least 9 points in order to demonstrate significant improvement in lower extremity function.  Baseline: 51; 07/09/23: 65/80 Goal status: MET  6.  Pt will demonstrate increase in LE strength to 4+/5 to facilitate ease and safety in ambulation Baseline: 2+/5; 07/09/23:  see above Goal status: on-going  ASSESSMENT:  CLINICAL  IMPRESSION: Patient with less fatigue reported today.  Able to tolerate increased exercise load without issue and no loss of balance in the PT gym today.  While patient was a little unsteady during activities, he only needed SBA for exercises and occasional CGA. Patient's daily function and tolerance to PT has improved since his evaluation.  Patient will benefit from skilled PT to address his functional impairments and limitations and promote return to function.   OBJECTIVE IMPAIRMENTS: Abnormal gait, decreased activity tolerance, decreased balance, difficulty walking, decreased strength, and impaired flexibility.   ACTIVITY LIMITATIONS: carrying, lifting, bending, standing, squatting, stairs, and transfers  PARTICIPATION LIMITATIONS: meal prep, cleaning, laundry, driving, shopping, community activity, and yard work  PERSONAL FACTORS: Age are also affecting patient's functional outcome.   REHAB POTENTIAL: Good  CLINICAL DECISION MAKING: Stable/uncomplicated  EVALUATION COMPLEXITY: Low  PLAN:  PT FREQUENCY: 1x/week  PT DURATION: 4 weeks  PLANNED INTERVENTIONS: Therapeutic exercises, Therapeutic activity, Neuromuscular re-education, Balance training, Gait training, Patient/Family education, Self Care, Stair training, and Manual therapy  PLAN FOR NEXT SESSION:  Reassess next visit; consider discharge. Confirm HEP compliance and clear up any patient questions and concerns.   3:51 PM, 07/29/23 Karna Christmas, SPT  " I agree with the following treatment note after reviewing documentation. This session was performed under the supervision of a licensed clinician."    8:01 AM, 07/30/23 Amy Small Lynch MPT [place] [facility] [place] [phone]

## 2023-07-30 ENCOUNTER — Ambulatory Visit: Payer: Medicare Other | Attending: Internal Medicine | Admitting: Internal Medicine

## 2023-07-30 ENCOUNTER — Ambulatory Visit
Admission: RE | Admit: 2023-07-30 | Discharge: 2023-07-30 | Disposition: A | Discharge status: Home / Self Care | Payer: Medicare Other | Source: Ambulatory Visit | Attending: Radiation Oncology | Admitting: Radiation Oncology

## 2023-07-30 ENCOUNTER — Inpatient Hospital Stay: Payer: Medicare Other

## 2023-07-30 ENCOUNTER — Other Ambulatory Visit (HOSPITAL_COMMUNITY): Payer: Self-pay

## 2023-07-30 ENCOUNTER — Other Ambulatory Visit: Payer: Self-pay

## 2023-07-30 ENCOUNTER — Inpatient Hospital Stay (HOSPITAL_BASED_OUTPATIENT_CLINIC_OR_DEPARTMENT_OTHER): Payer: Medicare Other | Admitting: Internal Medicine

## 2023-07-30 ENCOUNTER — Encounter: Payer: Self-pay | Admitting: Internal Medicine

## 2023-07-30 VITALS — BP 153/76 | HR 75 | Temp 97.3°F | Resp 15 | Wt 193.7 lb

## 2023-07-30 VITALS — BP 116/78 | HR 93 | Ht 72.0 in | Wt 191.0 lb

## 2023-07-30 DIAGNOSIS — Z79899 Other long term (current) drug therapy: Secondary | ICD-10-CM | POA: Diagnosis not present

## 2023-07-30 DIAGNOSIS — C711 Malignant neoplasm of frontal lobe: Secondary | ICD-10-CM | POA: Diagnosis not present

## 2023-07-30 DIAGNOSIS — C719 Malignant neoplasm of brain, unspecified: Secondary | ICD-10-CM

## 2023-07-30 DIAGNOSIS — I519 Heart disease, unspecified: Secondary | ICD-10-CM

## 2023-07-30 DIAGNOSIS — R569 Unspecified convulsions: Secondary | ICD-10-CM

## 2023-07-30 DIAGNOSIS — Z510 Encounter for antineoplastic radiation therapy: Secondary | ICD-10-CM | POA: Diagnosis not present

## 2023-07-30 LAB — CMP (CANCER CENTER ONLY)
ALT: 13 U/L (ref 0–44)
AST: 20 U/L (ref 15–41)
Albumin: 4 g/dL (ref 3.5–5.0)
Alkaline Phosphatase: 70 U/L (ref 38–126)
Anion gap: 6 (ref 5–15)
BUN: 13 mg/dL (ref 8–23)
CO2: 30 mmol/L (ref 22–32)
Calcium: 9 mg/dL (ref 8.9–10.3)
Chloride: 104 mmol/L (ref 98–111)
Creatinine: 1.06 mg/dL (ref 0.61–1.24)
GFR, Estimated: >60 mL/min (ref >60)
Glucose, Bld: 182 mg/dL — ABNORMAL HIGH (ref 70–99)
Potassium: 4.3 mmol/L (ref 3.5–5.1)
Sodium: 140 mmol/L (ref 135–145)
Total Bilirubin: 0.5 mg/dL (ref <1.2)
Total Protein: 6.6 g/dL (ref 6.5–8.1)

## 2023-07-30 LAB — CBC WITH DIFFERENTIAL (CANCER CENTER ONLY)
Abs Immature Granulocytes: 0.03 10*3/uL (ref 0.00–0.07)
Basophils Absolute: 0 10*3/uL (ref 0.0–0.1)
Basophils Relative: 1 %
Eosinophils Absolute: 0.2 10*3/uL (ref 0.0–0.5)
Eosinophils Relative: 3 %
HCT: 39.4 % (ref 39.0–52.0)
Hemoglobin: 12.5 g/dL — ABNORMAL LOW (ref 13.0–17.0)
Immature Granulocytes: 1 %
Lymphocytes Relative: 16 %
Lymphs Abs: 0.9 10*3/uL (ref 0.7–4.0)
MCH: 31 pg (ref 26.0–34.0)
MCHC: 31.7 g/dL (ref 30.0–36.0)
MCV: 97.8 fL (ref 80.0–100.0)
Monocytes Absolute: 0.5 10*3/uL (ref 0.1–1.0)
Monocytes Relative: 9 %
Neutro Abs: 4 10*3/uL (ref 1.7–7.7)
Neutrophils Relative %: 70 %
Platelet Count: 160 10*3/uL (ref 150–400)
RBC: 4.03 MIL/uL — ABNORMAL LOW (ref 4.22–5.81)
RDW: 15.6 % — ABNORMAL HIGH (ref 11.5–15.5)
WBC Count: 5.6 10*3/uL (ref 4.0–10.5)
nRBC: 0 % (ref 0.0–0.2)

## 2023-07-30 LAB — RAD ONC ARIA SESSION SUMMARY
Course Elapsed Days: 42
Plan Fractions Treated to Date: 7
Plan Prescribed Dose Per Fraction: 2 Gy
Plan Total Fractions Prescribed: 7
Plan Total Prescribed Dose: 14 Gy
Reference Point Dosage Given to Date: 14 Gy
Reference Point Session Dosage Given: 2 Gy
Session Number: 30

## 2023-07-30 MED ORDER — LEVETIRACETAM 500 MG PO TABS
500.0000 mg | ORAL_TABLET | Freq: Two times a day (BID) | ORAL | 3 refills | Status: DC
  Filled 2023-07-30: qty 60, 30d supply, fill #0
  Filled 2023-09-06: qty 60, 30d supply, fill #1
  Filled 2023-10-05: qty 60, 30d supply, fill #2

## 2023-07-30 NOTE — Progress Notes (Signed)
[facility] at [facility] [address]  [place], [place] [address] [phone]   Interval Evaluation  Date of Service: 07/30/23 Patient Name: Alex Church Patient MRN: [id number] Patient DOB: 1946-02-02 Provider: Henreitta Leber, MD  Identifying Statement:  Alex Church is a 77 y.o. male with right frontal glioblastoma   Oncologic History: Oncology History  Glioblastoma, IDH-wildtype (HCC)  05/12/2023 Surgery   Right frontal craniotomy, resection with Dr. Jake Samples; path is glioblastoma IDH-wt   06/09/2023 -  Chemotherapy   Patient is on Treatment Plan : BRAIN GLIOBLASTOMA Radiation Therapy With Concurrent Temozolomide 75 mg/m2 Daily Followed By Sequential Maintenance Temozolomide x 6-12 cycles          Interval History: Alex Church presents today for follow up, now having completed 6 weeks of radiation and Temodar.  No further seizures since prior visit.  Still doing well with Keppra.  No issues with RT or Temodar.  Remains active, independent.     H+P (06/02/23) Patient presented to neurologic attention in late August with several days history of left sided weakness, confusion.  Wife noticed he had difficulty getting out of bed, was dragging left side.  This was on top of several months history of progressive confusion, short term memory impairment.  CNS imaging demonstrated a large right frontal mass.  He underwent craniotomy and resection with Dr. Jake Samples, path demonstrated glioblastoma.  Since surgery he has been doing well, walking independently.  Denies seizures, headaches.  He is off decadron.   Medications: Current Outpatient Medications on File Prior to Visit  Medication Sig Dispense Refill   acetaminophen (TYLENOL) 500 MG tablet Take 1,000 mg by mouth every 6 (six) hours as needed for mild pain (pain score 1-3) (Tries to only take twice a week).     allopurinol (ZYLOPRIM) 100 MG tablet Take 1 tablet (100 mg total) by mouth 2 (two) times  daily. 180 tablet 1   atorvastatin (LIPITOR) 10 MG tablet Take 1 tablet (10 mg total) by mouth at bedtime. 90 tablet 1   Blood Glucose Monitoring Suppl (ONETOUCH VERIO FLEX SYSTEM) w/Device KIT Check blood sugar once daily 1 kit 0   carvedilol (COREG) 12.5 MG tablet Take 0.5 tablets (6.25 mg total) by mouth 2 (two) times daily with a meal. Due for appt 05/2023     cloNIDine (CATAPRES) 0.1 MG tablet Take 1 tablet (0.1 mg total) by mouth 2 (two) times daily. 180 tablet 1   Colchicine (MITIGARE) 0.6 MG CAPS Take 1 capsule (0.6 mg total) by mouth 2 (two) times daily as needed. 60 capsule 0   Cyanocobalamin (VITAMIN B-12) 1000 MCG/15ML LIQD Take 1,000 mcg by mouth daily.     diphenhydramine-acetaminophen (TYLENOL PM) 25-500 MG TABS tablet Take 1 tablet by mouth at bedtime as needed.     glucose blood test strip Check blood sugars once daily 100 each 12   Lancets (ONETOUCH ULTRASOFT) lancets Check bloods sugars once daily 100 each 12   levETIRAcetam (KEPPRA) 500 MG tablet Take 1 tablet (500 mg total) by mouth 2 (two) times daily. (Patient not taking: Reported on 07/20/2023) 60 tablet 0   lisinopril (ZESTRIL) 20 MG tablet Take 20 mg by mouth at bedtime.     melatonin 5 MG TABS Take 1 tablet (5 mg total) by mouth at bedtime. 30 tablet 0   metFORMIN (GLUCOPHAGE) 1000 MG tablet Take 0.5 tablets (500 mg total) by mouth 2 (two) times daily with a meal. 180 tablet 1  neomycin-polymyxin-dexameth (MAXITROL) 0.1 % OINT Place 1 Application (1/4 inch ribbon) into both eyes 3 (three) times daily. 3.5 g 1   ondansetron (ZOFRAN) 8 MG tablet Take 1 tablet (8 mg total) by mouth every 8 (eight) hours as needed for nausea or vomiting. May take 30-60 minutes prior to Temodar administration if nausea/vomiting occurs as needed. (Patient not taking: Reported on 07/30/2023) 30 tablet 1   pioglitazone (ACTOS) 30 MG tablet Take 1 tablet (30 mg total) by mouth daily.     polyethylene glycol (MIRALAX / GLYCOLAX) 17 g packet Take 17  g by mouth daily as needed for mild constipation. 14 each 0   sitaGLIPtin (JANUVIA) 100 MG tablet Take 1 tablet (100 mg total) by mouth daily. (Patient taking differently: Take 100 mg by mouth daily after supper.)     sodium chloride 1 g tablet Take 1 tablet (1 g total) by mouth 3 (three) times daily with meals. 90 tablet 0   tamsulosin (FLOMAX) 0.4 MG CAPS capsule Take 1 capsule (0.4 mg total) by mouth daily after supper. 90 capsule 0   temozolomide (TEMODAR) 140 MG capsule Take 1 capsule (140 mg total) by mouth at bedtime. May take on an empty stomach to decrease nausea & vomiting. (Patient not taking: Reported on 07/30/2023) 42 capsule 0   No current facility-administered medications on file prior to visit.    Allergies: No Known Allergies Past Medical History:  Past Medical History:  Diagnosis Date   B12 deficiency    monthly shots   Diabetes mellitus    Glaucoma suspect    Glioblastoma (HCC)    Hyperlipemia    Hypertension    Past Surgical History:  Past Surgical History:  Procedure Laterality Date   APPLICATION OF CRANIAL NAVIGATION Right 05/12/2023   Procedure: APPLICATION OF CRANIAL NAVIGATION;  Surgeon: Dawley, Alan Mulder, DO;  Location: [facility];  Service: Neurosurgery;  Laterality: Right;   CRANIOTOMY Right 05/12/2023   Procedure: RIGHT STERIOSTACTIC FRONTAL CRANIOTOMY FOR TUMOR RESECTION;  Surgeon: Bethann Goo, DO;  Location: [facility] OR;  Service: Neurosurgery;  Laterality: Right;   Social History:  Social History   Socioeconomic History   Marital status: Married    Spouse name: Not on file   Number of children: 0   Years of education: Not on file   Highest education level: GED or equivalent  Occupational History   Occupation: retired from [employer], works for a lab driving  Tobacco Use   Smoking status: Never   Smokeless tobacco: Never  Substance and Sexual Activity   Alcohol use: No   Drug use: No   Sexual activity: Not on file  Other Topics Concern   Not on file   Social History Narrative   Married (2nd marriage)   Lives w/ wife     Has a rescue dog   Social Determinants of Health   Financial Resource Strain: Low Risk  (07/13/2023)   Overall Financial Resource Strain (CARDIA)    Difficulty of Paying Living Expenses: Not hard at all  Food Insecurity: No Food Insecurity (07/13/2023)   Hunger Vital Sign    Worried About Running Out of Food in the Last Year: Never true    Ran Out of Food in the Last Year: Never true  Transportation Needs: No Transportation Needs (07/13/2023)   PRAPARE - Administrator, Civil Service (Medical): No    Lack of Transportation (Non-Medical): No  Physical Activity: Insufficiently Active (07/13/2023)   Exercise Vital Sign  Days of Exercise per Week: 2 days    Minutes of Exercise per Session: 20 min  Stress: Stress Concern Present (07/13/2023)   [employer] of Occupational Health - Occupational Stress Questionnaire    Feeling of Stress : To some extent  Social Connections: Moderately Isolated (07/13/2023)   Social Connection and Isolation Panel [NHANES]    Frequency of Communication with Friends and Family: Twice a week    Frequency of Social Gatherings with Friends and Family: More than three times a week    Attends Religious Services: Never    Database administrator or Organizations: No    Attends Engineer, structural: Not on file    Marital Status: Married  Catering manager Violence: Not At Risk (06/02/2023)   Humiliation, Afraid, Rape, and Kick questionnaire    Fear of Current or Ex-Partner: No    Emotionally Abused: No    Physically Abused: No    Sexually Abused: No   Family History:  Family History  Problem Relation Age of Onset   Diabetes Maternal Uncle    Heart attack Brother 27   Cancer Mother        intra-abdominal   Cirrhosis Cousin        maternal   Diabetes Cousin        maternal   Stroke Neg Hx    Colon cancer Neg Hx    Prostate cancer Neg Hx     Review of  Systems: Constitutional: Doesn't report fevers, chills or abnormal weight loss Eyes: Doesn't report blurriness of vision Ears, nose, mouth, throat, and face: Doesn't report sore throat Respiratory: Doesn't report cough, dyspnea or wheezes Cardiovascular: Doesn't report palpitation, chest discomfort  Gastrointestinal:  Doesn't report nausea, constipation, diarrhea GU: Doesn't report incontinence Skin: Doesn't report skin rashes Neurological: Per HPI Musculoskeletal: Doesn't report joint pain Behavioral/Psych: Doesn't report anxiety  Physical Exam: Vitals:   07/30/23 0924  BP: (!) 153/76  Pulse: 75  Resp: 15  Temp: (!) 97.3 F (36.3 C)  SpO2: 96%   KPS: 80. General: Alert, cooperative, pleasant, in no acute distress Head: Normal EENT: No conjunctival injection or scleral icterus.  Lungs: Resp effort normal Cardiac: Regular rate Abdomen: Non-distended abdomen Skin: No rashes cyanosis or petechiae. Extremities: No clubbing or edema  Neurologic Exam: Mental Status: Awake, alert, attentive to examiner. Oriented to self and environment. Language is fluent with intact comprehension.  Cranial Nerves: Visual acuity is grossly normal. Visual fields are full. Extra-ocular movements intact. No ptosis. Face is symmetric Motor: Tone and bulk are normal. Power is full in both arms and legs. Reflexes are symmetric, no pathologic reflexes present.  Sensory: Intact to light touch Gait: Normal.   Labs: I have reviewed the data as listed    Component Value Date/Time   NA 140 07/30/2023 0840   NA 139 04/06/2022 1047   K 4.3 07/30/2023 0840   CL 104 07/30/2023 0840   CO2 30 07/30/2023 0840   GLUCOSE 182 (H) 07/30/2023 0840   GLUCOSE 147 (H) 08/26/2006 1051   BUN 13 07/30/2023 0840   BUN 17 04/06/2022 1047   CREATININE 1.06 07/30/2023 0840   CREATININE 1.09 06/20/2020 1509   CALCIUM 9.0 07/30/2023 0840   PROT 6.6 07/30/2023 0840   ALBUMIN 4.0 07/30/2023 0840   AST 20 07/30/2023  0840   ALT 13 07/30/2023 0840   ALKPHOS 70 07/30/2023 0840   BILITOT 0.5 07/30/2023 0840   GFRNONAA >60 07/30/2023 0840   GFRAA 126 08/26/2007 0943  Lab Results  Component Value Date   WBC 5.6 07/30/2023   NEUTROABS 4.0 07/30/2023   HGB 12.5 (L) 07/30/2023   HCT 39.4 07/30/2023   MCV 97.8 07/30/2023   PLT 160 07/30/2023     Assessment/Plan Glioblastoma, IDH-wildtype (HCC)  Seizure (HCC)  Alex Church is clinically stable today, now having completed 6 weeks of IMRT and Temodar.  Labs are again WNL.  We ultimately recommended completing course of intensity modulated radiation therapy and concurrent daily Temozolomide.  Radiation will be administered Mon-Fri over 6 weeks, Temodar will be dosed at 75mg /m2 to be given daily over 42 days.  We reviewed side effects of temodar, including fatigue, nausea/vomiting, constipation, and cytopenias.  Chemotherapy should be held for the following:  ANC less than 1,000  Platelets less than 100,000  LFT or creatinine greater than 2x ULN  If clinical concerns/contraindications develop  Keppra should continue at 500mg  BID.  All questions were answered. The patient knows to call the clinic with any problems, questions or concerns. No barriers to learning were detected.  We ask that Alex Church return to clinic in 1 months following post-RT brain MRI, or sooner as needed.  The total time spent in the encounter was 30 minutes and more than 50% was on counseling and review of test results   Henreitta Leber, MD Medical Director of Neuro-Oncology [facility] 07/30/23 9:39 AM

## 2023-07-30 NOTE — Patient Instructions (Signed)
Medication Instructions:  Your physician recommends that you continue on your current medications as directed. Please refer to the Current Medication list given to you today.  *If you need a refill on your cardiac medications before your next appointment, please call your pharmacy*   Lab Work: NONE   If you have labs (blood work) drawn today and your tests are completely normal, you will receive your results only by: MyChart Message (if you have MyChart) OR A paper copy in the mail If you have any lab test that is abnormal or we need to change your treatment, we will call you to review the results.   Testing/Procedures: NONE    Follow-Up: At [facility], you and your health needs are our priority.  As part of our continuing mission to provide you with exceptional heart care, we have created designated Provider Care Teams.  These Care Teams include your primary Cardiologist (physician) and Advanced Practice Providers (APPs -  Physician Assistants and Nurse Practitioners) who all work together to provide you with the care you need, when you need it.  We recommend signing up for the patient portal called "MyChart".  Sign up information is provided on this After Visit Summary.  MyChart is used to connect with patients for Virtual Visits (Telemedicine).  Patients are able to view lab/test results, encounter notes, upcoming appointments, etc.  Non-urgent messages can be sent to your provider as well.   To learn more about what you can do with MyChart, go to [email or web address].    Your next appointment:   6 month(s)  Provider:   Gregg Taylor, MD    Other Instructions Thank you for choosing [facility]!    

## 2023-07-30 NOTE — Progress Notes (Signed)
HPI [name] returns today for followup. I saw him a month ago with an episode of altered consciousness and LV dysfunction. He has GBM and likely had a seizure. He has undergone XRT and immune therapy. He is improving. He is pending more chemo. He has not had any more episodes of altered mentation.  No Known Allergies   Current Outpatient Medications  Medication Sig Dispense Refill   acetaminophen (TYLENOL) 500 MG tablet Take 1,000 mg by mouth every 6 (six) hours as needed for mild pain (pain score 1-3) (Tries to only take twice a week).     allopurinol (ZYLOPRIM) 100 MG tablet Take 1 tablet (100 mg total) by mouth 2 (two) times daily. 180 tablet 1   atorvastatin (LIPITOR) 10 MG tablet Take 1 tablet (10 mg total) by mouth at bedtime. 90 tablet 1   Blood Glucose Monitoring Suppl (ONETOUCH VERIO FLEX SYSTEM) w/Device KIT Check blood sugar once daily 1 kit 0   carvedilol (COREG) 12.5 MG tablet Take 0.5 tablets (6.25 mg total) by mouth 2 (two) times daily with a meal. Due for appt 05/2023     cloNIDine (CATAPRES) 0.1 MG tablet Take 1 tablet (0.1 mg total) by mouth 2 (two) times daily. 180 tablet 1   Colchicine (MITIGARE) 0.6 MG CAPS Take 1 capsule (0.6 mg total) by mouth 2 (two) times daily as needed. 60 capsule 0   Cyanocobalamin (VITAMIN B-12) 1000 MCG/15ML LIQD Take 1,000 mcg by mouth daily.     diphenhydramine-acetaminophen (TYLENOL PM) 25-500 MG TABS tablet Take 1 tablet by mouth at bedtime as needed.     glucose blood test strip Check blood sugars once daily 100 each 12   Lancets (ONETOUCH ULTRASOFT) lancets Check bloods sugars once daily 100 each 12   levETIRAcetam (KEPPRA) 500 MG tablet Take 1 tablet (500 mg total) by mouth 2 (two) times daily. 60 tablet 3   lisinopril (ZESTRIL) 20 MG tablet Take 20 mg by mouth at bedtime.     metFORMIN (GLUCOPHAGE) 1000 MG tablet Take 0.5 tablets (500 mg total) by mouth 2 (two) times daily with a meal. 180 tablet 1   neomycin-polymyxin-dexameth  (MAXITROL) 0.1 % OINT Place 1 Application (1/4 inch ribbon) into both eyes 3 (three) times daily. 3.5 g 1   pioglitazone (ACTOS) 30 MG tablet Take 1 tablet (30 mg total) by mouth daily.     polyethylene glycol (MIRALAX / GLYCOLAX) 17 g packet Take 17 g by mouth daily as needed for mild constipation. 14 each 0   sitaGLIPtin (JANUVIA) 100 MG tablet Take 1 tablet (100 mg total) by mouth daily. (Patient taking differently: Take 100 mg by mouth daily after supper.)     tamsulosin (FLOMAX) 0.4 MG CAPS capsule Take 1 capsule (0.4 mg total) by mouth daily after supper. 90 capsule 0   melatonin 5 MG TABS Take 1 tablet (5 mg total) by mouth at bedtime. (Patient not taking: Reported on 07/30/2023) 30 tablet 0   ondansetron (ZOFRAN) 8 MG tablet Take 1 tablet (8 mg total) by mouth every 8 (eight) hours as needed for nausea or vomiting. May take 30-60 minutes prior to Temodar administration if nausea/vomiting occurs as needed. (Patient not taking: Reported on 07/30/2023) 30 tablet 1   sodium chloride 1 g tablet Take 1 tablet (1 g total) by mouth 3 (three) times daily with meals. (Patient not taking: Reported on 07/30/2023) 90 tablet 0   temozolomide (TEMODAR) 140 MG capsule Take 1 capsule (140 mg  total) by mouth at bedtime. May take on an empty stomach to decrease nausea & vomiting. (Patient not taking: Reported on 07/30/2023) 42 capsule 0   No current facility-administered medications for this visit.     Past Medical History:  Diagnosis Date   B12 deficiency    monthly shots   Diabetes mellitus    Glaucoma suspect    Glioblastoma (HCC)    Hyperlipemia    Hypertension     ROS:   All systems reviewed and negative except as noted in the HPI.   Past Surgical History:  Procedure Laterality Date   APPLICATION OF CRANIAL NAVIGATION Right 05/12/2023   Procedure: APPLICATION OF CRANIAL NAVIGATION;  Surgeon: Dawley, Alan Mulder, DO;  Location: [facility] OR;  Service: Neurosurgery;  Laterality: Right;   CRANIOTOMY  Right 05/12/2023   Procedure: RIGHT STERIOSTACTIC FRONTAL CRANIOTOMY FOR TUMOR RESECTION;  Surgeon: Bethann Goo, DO;  Location: [facility] OR;  Service: Neurosurgery;  Laterality: Right;     Family History  Problem Relation Age of Onset   Diabetes Maternal Uncle    Heart attack Brother 55   Cancer Mother        intra-abdominal   Cirrhosis Cousin        maternal   Diabetes Cousin        maternal   Stroke Neg Hx    Colon cancer Neg Hx    Prostate cancer Neg Hx      Social History   Socioeconomic History   Marital status: Married    Spouse name: Not on file   Number of children: 0   Years of education: Not on file   Highest education level: GED or equivalent  Occupational History   Occupation: retired from [employer], works for a lab driving  Tobacco Use   Smoking status: Never   Smokeless tobacco: Never  Substance and Sexual Activity   Alcohol use: No   Drug use: No   Sexual activity: Not on file  Other Topics Concern   Not on file  Social History Narrative   Married (2nd marriage)   Lives w/ wife     Has a rescue dog   Social Determinants of Health   Financial Resource Strain: Low Risk  (07/13/2023)   Overall Financial Resource Strain (CARDIA)    Difficulty of Paying Living Expenses: Not hard at all  Food Insecurity: No Food Insecurity (07/13/2023)   Hunger Vital Sign    Worried About Running Out of Food in the Last Year: Never true    Ran Out of Food in the Last Year: Never true  Transportation Needs: No Transportation Needs (07/13/2023)   PRAPARE - Administrator, Civil Service (Medical): No    Lack of Transportation (Non-Medical): No  Physical Activity: Insufficiently Active (07/13/2023)   Exercise Vital Sign    Days of Exercise per Week: 2 days    Minutes of Exercise per Session: 20 min  Stress: Stress Concern Present (07/13/2023)   [employer] of Occupational Health - Occupational Stress Questionnaire    Feeling of Stress : To some extent  Social  Connections: Moderately Isolated (07/13/2023)   Social Connection and Isolation Panel [NHANES]    Frequency of Communication with Friends and Family: Twice a week    Frequency of Social Gatherings with Friends and Family: More than three times a week    Attends Religious Services: Never    Database administrator or Organizations: No    Attends Banker Meetings:  Not on file    Marital Status: Married  Intimate Partner Violence: Not At Risk (06/02/2023)   Humiliation, Afraid, Rape, and Kick questionnaire    Fear of Current or Ex-Partner: No    Emotionally Abused: No    Physically Abused: No    Sexually Abused: No     BP 116/78   Pulse 93   Ht 6' (1.829 m)   Wt 191 lb (86.6 kg)   SpO2 93%   BMI 25.90 kg/m   Physical Exam:  Well appearing NAD HEENT: Unremarkable Neck:  No JVD, no thyromegally Lymphatics:  No adenopathy Back:  No CVA tenderness Lungs:  Clear HEART:  Regular rate rhythm, no murmurs, no rubs, no clicks Abd:  soft, positive bowel sounds, no organomegally, no rebound, no guarding Ext:  2 plus pulses, no edema, no cyanosis, no clubbing Skin:  No rashes no nodules Neuro:  CN II through XII intact, motor grossly intact  DEVICE  Normal device function.  See PaceArt for details.   Assess/Plan: LV dysfunction - he is asymptomatic. He will continue his current GDMT with coreg and lisinopril. He will undergo watchful waiting. Altered mentation - he will continue Keppra. He is almost certain to have had a seizure. We will follow. Sharlot Gowda Hanan Mcwilliams,MD

## 2023-07-31 ENCOUNTER — Ambulatory Visit (HOSPITAL_COMMUNITY): Payer: Medicare Other | Admitting: Occupational Therapy

## 2023-07-31 ENCOUNTER — Encounter (HOSPITAL_COMMUNITY): Payer: Self-pay | Admitting: Occupational Therapy

## 2023-07-31 DIAGNOSIS — R41841 Cognitive communication deficit: Secondary | ICD-10-CM | POA: Diagnosis not present

## 2023-07-31 DIAGNOSIS — R278 Other lack of coordination: Secondary | ICD-10-CM

## 2023-07-31 DIAGNOSIS — R262 Difficulty in walking, not elsewhere classified: Secondary | ICD-10-CM | POA: Diagnosis not present

## 2023-07-31 DIAGNOSIS — R29818 Other symptoms and signs involving the nervous system: Secondary | ICD-10-CM

## 2023-07-31 DIAGNOSIS — R404 Transient alteration of awareness: Secondary | ICD-10-CM | POA: Diagnosis not present

## 2023-07-31 DIAGNOSIS — I5020 Unspecified systolic (congestive) heart failure: Secondary | ICD-10-CM | POA: Diagnosis not present

## 2023-07-31 DIAGNOSIS — R29898 Other symptoms and signs involving the musculoskeletal system: Secondary | ICD-10-CM | POA: Diagnosis not present

## 2023-07-31 NOTE — Therapy (Unsigned)
OUTPATIENT OCCUPATIONAL THERAPY NEURO TREATMENT NOTE  Patient Name: Alex Church MRN: [id number] DOB:21-Feb-1946, 77 y.o., male Today's Date: 07/31/2023  PCP: Willow Ora, MD REFERRING PROVIDER: Delle Reining, PA-C  END OF SESSION:    Past Medical History:  Diagnosis Date   B12 deficiency    monthly shots   Diabetes mellitus    Glaucoma suspect    Glioblastoma (HCC)    Hyperlipemia    Hypertension    Past Surgical History:  Procedure Laterality Date   APPLICATION OF CRANIAL NAVIGATION Right 05/12/2023   Procedure: APPLICATION OF CRANIAL NAVIGATION;  Surgeon: Bethann Goo, DO;  Location: [facility] OR;  Service: Neurosurgery;  Laterality: Right;   CRANIOTOMY Right 05/12/2023   Procedure: RIGHT STERIOSTACTIC FRONTAL CRANIOTOMY FOR TUMOR RESECTION;  Surgeon: Bethann Goo, DO;  Location: [facility] OR;  Service: Neurosurgery;  Laterality: Right;   Patient Active Problem List   Diagnosis Date Noted   Seizure (HCC) 07/12/2023   Hypomagnesemia 07/12/2023   Myocardial injury 07/12/2023   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   Glioblastoma, 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   PVC (premature ventricular contraction) 08/27/2022   Glaucoma suspect 04/30/2020   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  Rationale for Evaluation and Treatment: Rehabilitation  SUBJECTIVE:   SUBJECTIVE STATEMENT: "I'm just tired" Pt accompanied by: self and significant other  PERTINENT HISTORY: The patient was admitted to [facility] on 05/15/2023 and discharged on  05/23/2023 to Inpatient rehab and spent 18 days A. 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  PRECAUTIONS: Fall  WEIGHT BEARING RESTRICTIONS: No  PAIN:  Are you having pain? No  FALLS: Has patient fallen in last 6 months? Yes. Number of falls "multiple"  LIVING ENVIRONMENT: Lives with: lives with their family Lives in: House/apartment Stairs: Yes: Internal: 13 steps; can reach both and External: 3 steps; none Has following equipment at home: Single point cane, Walker - 2 wheeled, shower chair, bed side commode, and Grab bars  PLOF: Independent  PATIENT GOALS: "I'd like to get back to where I was at"  OBJECTIVE:  Note: Objective measures were completed at Evaluation unless otherwise noted.  HAND DOMINANCE: Right  ADLs: Overall ADLs: Pt reports difficulty with lifting and carrying heavy items, specifically, yard work, cleaning, and cooking.   MOBILITY STATUS: Hx of falls  POSTURE COMMENTS:  rounded shoulders Sitting balance: Moves/returns truncal midpoint 1-2 inches in multiple planes  ACTIVITY TOLERANCE: Activity tolerance: Mildly limited  FUNCTIONAL OUTCOME MEASURES: FOTO: 57.89/100  UPPER EXTREMITY ROM:    All Motions [facility]  UPPER EXTREMITY MMT:     MMT Right eval Left eval Right 07/31/23 Left 07/31/23  Shoulder flexion 4/5 4-/5 5/5 5/5  Shoulder abduction 5/5 4+/5 5/5 5/5  Shoulder adduction 5/5 5/5 5/5 5/5  Shoulder extension 4+/5 4+/5 5/5 5/5  Shoulder internal rotation 4+/5 4/5 5/5 5/5  Shoulder external rotation 4+/5 4/5 5/5 5/5  Elbow flexion 5/5 4+/5 5/5 5/5  Elbow extension 5/5 4/5 5/5 5/5  Wrist flexion 5/5 4+/5 5/5 5/5  Wrist extension 5/5 4+/5 5/5 5/5  Wrist ulnar deviation 5/5 5/5 5/5 5/5  Wrist radial deviation 5/5 5/5 5/5 5/5  Wrist pronation 5/5 5/5 5/5 5/5  Wrist supination 5/5 5/5 5/5 5/5  (Blank rows = not tested)  HAND FUNCTION: Grip strength: Right: 68 lbs; Left:  64 lbs, Lateral pinch: Right: 20 lbs, Left: 19 lbs, and 3 point pinch: Right: 15 lbs, Left: 12 lbs 07/31/23: Grip strength: Right: 71 lbs; Left: 66 lbs, Lateral pinch: Right: 19 lbs, Left: 18 lbs, and 3 point pinch: Right: 17 lbs, Left: 15 lbs  COORDINATION: 9 Hole Peg test: Right: 30.87 sec; Left: 34.80 sec  SENSATION: WFL  EDEMA: No swelling noted  OBSERVATIONS: Mildly slowed movements in the hands   TODAY'S TREATMENT:                                                                                                                              DATE:   07/21/23 -Grooved peg board -Gripper: RUE 55# 10 medium beads, LUE 38# 10 medium beads -Cards: BUE shuffling, using thumb to Push 1 card out and flip over x10 each hand -Wrist Strengthening: 3lb dumbbell, flexion, extension, ulnar/radial deviation, supination/pronation, x12 -Strengthening: 3lb dumbbell, hammer curl, bicep curl, protraction, horizontal abduction, x15 -Theraband strengthening: chest pulls, overhead pulls, PNF up, PNF down, x12  07/14/23 -Theraball Exercises: flexion, protraction, overhead press, V ups, circles both directions, x12 -Theraputty: red putty, put 12 tiny pegs in putty, roll into a ball, push, pinch and pull to find all tiny pegs -Tiny Peg Board: using tweezers completing pattern provided  07/07/23 -Shoulder Strengthening: 2lb dumbbell,  flexion, abduction, protraction, horizontal abduction, er/IR, x10 -Strengthening: 4lb, hammer curl, bicep curl, x10 -Wrist Strengthening: 3lb dumbbell, extension, flexion, ulnar/radial deviation, supination/pronation, x10 -Building a table: min difficulty following complete instructions, good bilateral manual skills.    PATIENT EDUCATION: Education details: PNF Strengthening Person educated: Patient Education method: Explanation Education comprehension: verbalized understanding  HOME EXERCISE PROGRAM: 10/16: Shoulder strengthening 10/22: Scapular  Strengthening 10/29: Wrist Strengthening  11/12: PNF Strengthening   GOALS: Goals reviewed with patient? Yes  SHORT TERM GOALS: Target date: 07/31/23  Pt will be provided comprehensive HEP for BUE strengthening in order to complete function tasks independently.   Goal status: IN PROGRESS  2.  Pt will improve BUE strength in order to complete cooking and cleaning at home independently.   Goal status: IN PROGRESS  3.  Pt will increased BUE grip by 10lbs and pinch by 2lbs in order to maintain grasp during yard work tasks.   Goal status: IN PROGRESS  4.  Pt will increase BUE coordination to 27 seconds or quicker in order to improve effectiveness of manipulating buttons, zippers, and clasps.   Goal status: IN PROGRESS   ASSESSMENT:  CLINICAL IMPRESSION: Pt continuing to have increased fatigue, which he is relating to his radiation treatments. This session he presented with mildly increased weakness, requiring lighter weights. He was  able to complete all exercises, only taking short breaks between each set of exercises. Overall coordination, continues to improve with effectiveness, however he requires increased time for good control. Verbal and tactile cuing provided for positioning and technique.   PERFORMANCE DEFICITS: in functional skills including ADLs, IADLs, coordination, ROM, strength, Fine motor control, Gross motor control, body mechanics, and UE functional use.    PLAN:  OT FREQUENCY: 1-2x/week  OT DURATION: 6 weeks  PLANNED INTERVENTIONS: self care/ADL training, therapeutic exercise, therapeutic activity, neuromuscular re-education, manual therapy, passive range of motion, functional mobility training, electrical stimulation, ultrasound, paraffin, moist heat, patient/family education, coping strategies training, and DME and/or AE instructions  RECOMMENDED OTHER SERVICES: N/A  CONSULTED AND AGREED WITH PLAN OF CARE: Patient  PLAN FOR NEXT SESSION: Manual therapy as  needed, shoulder strengthening, wrist strengthening, grip and pinch strengthening, stabilization.   Trish Mage, OTR/L [facility] [phone] Kennyth Arnold, OT 07/31/2023, 1:50 PM

## 2023-08-01 ENCOUNTER — Other Ambulatory Visit: Payer: Self-pay | Admitting: Internal Medicine

## 2023-08-03 ENCOUNTER — Encounter (HOSPITAL_COMMUNITY): Payer: Medicare Other | Admitting: Speech Pathology

## 2023-08-03 ENCOUNTER — Encounter: Payer: Self-pay | Admitting: Internal Medicine

## 2023-08-03 DIAGNOSIS — H43813 Vitreous degeneration, bilateral: Secondary | ICD-10-CM | POA: Diagnosis not present

## 2023-08-03 DIAGNOSIS — H00015 Hordeolum externum left lower eyelid: Secondary | ICD-10-CM | POA: Diagnosis not present

## 2023-08-05 ENCOUNTER — Other Ambulatory Visit (HOSPITAL_COMMUNITY): Payer: Self-pay

## 2023-08-05 ENCOUNTER — Encounter (HOSPITAL_COMMUNITY): Payer: Medicare Other

## 2023-08-05 MED ORDER — NEOMYCIN-POLYMYXIN-DEXAMETH 0.1 % OP OINT
1.0000 | TOPICAL_OINTMENT | Freq: Three times a day (TID) | OPHTHALMIC | 1 refills | Status: DC
  Filled 2023-08-05: qty 3.5, 3d supply, fill #0
  Filled 2023-08-17: qty 3.5, 3d supply, fill #1

## 2023-08-11 ENCOUNTER — Other Ambulatory Visit (HOSPITAL_COMMUNITY): Payer: Self-pay

## 2023-08-11 ENCOUNTER — Telehealth: Payer: Self-pay | Admitting: *Deleted

## 2023-08-11 ENCOUNTER — Encounter: Payer: Self-pay | Admitting: Internal Medicine

## 2023-08-11 ENCOUNTER — Other Ambulatory Visit: Payer: Self-pay | Admitting: *Deleted

## 2023-08-11 DIAGNOSIS — C719 Malignant neoplasm of brain, unspecified: Secondary | ICD-10-CM

## 2023-08-11 MED ORDER — ONDANSETRON HCL 8 MG PO TABS
8.0000 mg | ORAL_TABLET | Freq: Three times a day (TID) | ORAL | 1 refills | Status: DC | PRN
  Filled 2023-08-11: qty 30, 10d supply, fill #0

## 2023-08-11 NOTE — Telephone Encounter (Signed)
Returned PC to patient's wife [name], informed her Dr Barbaraann Cao does not think the patient's recent episodes of vomiting are signs of progression.  Patient has brain MRI coming up on 08/20/23.  [name] states patient needs a refill of zofran.  She verbalizes understanding.

## 2023-08-11 NOTE — Telephone Encounter (Signed)
-----   Message from Henreitta Leber sent at 08/11/2023  2:48 PM EST ----- Regarding: RE: Vomiting Usually would recommend zofran 8mg ... does he have some leftover from radiation?  Or is this despite the usual dosing of zofran?  Henreitta Leber, MD ----- Message ----- From: Arville Care, RN Sent: 08/11/2023  11:00 AM EST To: Henreitta Leber, MD Subject: Vomiting                                       This patient's wife sent in a MyChart message & I called her.  Mr Bidlack has started vomiting again - this was happening before he was dx'd (which was unusual for him).  He has had almost no vomiting since his surgery & radiation tx until 3 weeks ago.  She said he only vomits about once a week, but it is very sudden & it is "a lot."  He doesn't have nausea or pain but random vomiting & they are concerned.  Do you have any recommendation?  Darel Hong

## 2023-08-12 ENCOUNTER — Encounter (HOSPITAL_COMMUNITY): Payer: Medicare Other

## 2023-08-12 ENCOUNTER — Ambulatory Visit: Payer: Medicare Other | Admitting: Internal Medicine

## 2023-08-14 ENCOUNTER — Encounter: Payer: Self-pay | Admitting: Internal Medicine

## 2023-08-14 NOTE — Telephone Encounter (Signed)
Called and scheduled patient per in basket message. Patient scheduled for 12/16@ 2pm. Message left for patient.

## 2023-08-17 ENCOUNTER — Telehealth: Payer: Self-pay | Admitting: *Deleted

## 2023-08-17 ENCOUNTER — Other Ambulatory Visit: Payer: Self-pay

## 2023-08-17 ENCOUNTER — Other Ambulatory Visit (HOSPITAL_COMMUNITY): Payer: Self-pay

## 2023-08-17 ENCOUNTER — Other Ambulatory Visit: Payer: Self-pay | Admitting: Internal Medicine

## 2023-08-17 MED ORDER — CARVEDILOL 12.5 MG PO TABS
6.2500 mg | ORAL_TABLET | Freq: Two times a day (BID) | ORAL | 1 refills | Status: DC
  Filled 2023-08-17: qty 90, 90d supply, fill #0

## 2023-08-17 MED ORDER — JANUVIA 100 MG PO TABS
100.0000 mg | ORAL_TABLET | Freq: Every day | ORAL | 1 refills | Status: DC
  Filled 2023-08-17: qty 90, 90d supply, fill #0
  Filled 2023-08-18: qty 12, 12d supply, fill #0
  Filled 2023-09-06: qty 30, 30d supply, fill #1
  Filled 2023-09-09: qty 90, 90d supply, fill #1
  Filled 2023-09-10: qty 30, 30d supply, fill #1

## 2023-08-17 MED ORDER — PIOGLITAZONE HCL 30 MG PO TABS
30.0000 mg | ORAL_TABLET | Freq: Every day | ORAL | 1 refills | Status: DC
  Filled 2023-08-17: qty 90, 90d supply, fill #0

## 2023-08-17 NOTE — Telephone Encounter (Signed)
RETURNED PATIENT'S WIFE'S PHONE CALL, SPOKE WITH Alex Church

## 2023-08-18 ENCOUNTER — Other Ambulatory Visit (HOSPITAL_COMMUNITY): Payer: Self-pay

## 2023-08-19 ENCOUNTER — Other Ambulatory Visit (HOSPITAL_COMMUNITY): Payer: Self-pay

## 2023-08-20 ENCOUNTER — Ambulatory Visit (HOSPITAL_COMMUNITY)
Admission: RE | Admit: 2023-08-20 | Discharge: 2023-08-20 | Disposition: A | Discharge status: Home / Self Care | Payer: Medicare Other | Source: Ambulatory Visit | Attending: Internal Medicine | Admitting: Internal Medicine

## 2023-08-20 DIAGNOSIS — G319 Degenerative disease of nervous system, unspecified: Secondary | ICD-10-CM | POA: Diagnosis not present

## 2023-08-20 DIAGNOSIS — C719 Malignant neoplasm of brain, unspecified: Secondary | ICD-10-CM | POA: Insufficient documentation

## 2023-08-20 MED ORDER — GADOBUTROL 1 MMOL/ML IV SOLN
8.0000 mL | Freq: Once | INTRAVENOUS | Status: AC | PRN
  Administered 2023-08-20: 8 mL via INTRAVENOUS

## 2023-08-21 ENCOUNTER — Telehealth: Payer: Self-pay | Admitting: *Deleted

## 2023-08-21 NOTE — Telephone Encounter (Signed)
Called patient to inform that a nurse will call him on 09-10-23 @ 9 am, spoke with wife [name] and she is aware of this telephone fu

## 2023-08-24 ENCOUNTER — Inpatient Hospital Stay: Payer: Medicare Other

## 2023-08-24 ENCOUNTER — Other Ambulatory Visit (HOSPITAL_COMMUNITY): Payer: Self-pay

## 2023-08-24 ENCOUNTER — Telehealth: Payer: Self-pay | Admitting: Pharmacist

## 2023-08-24 ENCOUNTER — Other Ambulatory Visit: Payer: Self-pay

## 2023-08-24 ENCOUNTER — Inpatient Hospital Stay: Payer: Medicare Other | Attending: Internal Medicine | Admitting: Internal Medicine

## 2023-08-24 ENCOUNTER — Telehealth: Payer: Self-pay

## 2023-08-24 VITALS — BP 145/60 | HR 76 | Temp 97.3°F | Resp 18 | Wt 190.7 lb

## 2023-08-24 DIAGNOSIS — Z9221 Personal history of antineoplastic chemotherapy: Secondary | ICD-10-CM | POA: Insufficient documentation

## 2023-08-24 DIAGNOSIS — R569 Unspecified convulsions: Secondary | ICD-10-CM | POA: Diagnosis not present

## 2023-08-24 DIAGNOSIS — C719 Malignant neoplasm of brain, unspecified: Secondary | ICD-10-CM

## 2023-08-24 DIAGNOSIS — Z923 Personal history of irradiation: Secondary | ICD-10-CM | POA: Diagnosis not present

## 2023-08-24 DIAGNOSIS — C711 Malignant neoplasm of frontal lobe: Secondary | ICD-10-CM | POA: Insufficient documentation

## 2023-08-24 DIAGNOSIS — H43813 Vitreous degeneration, bilateral: Secondary | ICD-10-CM | POA: Diagnosis not present

## 2023-08-24 DIAGNOSIS — H00015 Hordeolum externum left lower eyelid: Secondary | ICD-10-CM | POA: Diagnosis not present

## 2023-08-24 LAB — CBC WITH DIFFERENTIAL (CANCER CENTER ONLY)
Abs Immature Granulocytes: 0.02 10*3/uL (ref 0.00–0.07)
Basophils Absolute: 0.1 10*3/uL (ref 0.0–0.1)
Basophils Relative: 1 %
Eosinophils Absolute: 0.1 10*3/uL (ref 0.0–0.5)
Eosinophils Relative: 1 %
HCT: 41.5 % (ref 39.0–52.0)
Hemoglobin: 13.3 g/dL (ref 13.0–17.0)
Immature Granulocytes: 0 %
Lymphocytes Relative: 12 %
Lymphs Abs: 0.8 10*3/uL (ref 0.7–4.0)
MCH: 30.6 pg (ref 26.0–34.0)
MCHC: 32 g/dL (ref 30.0–36.0)
MCV: 95.4 fL (ref 80.0–100.0)
Monocytes Absolute: 0.5 10*3/uL (ref 0.1–1.0)
Monocytes Relative: 7 %
Neutro Abs: 5.5 10*3/uL (ref 1.7–7.7)
Neutrophils Relative %: 79 %
Platelet Count: 145 10*3/uL — ABNORMAL LOW (ref 150–400)
RBC: 4.35 MIL/uL (ref 4.22–5.81)
RDW: 14.6 % (ref 11.5–15.5)
WBC Count: 6.9 10*3/uL (ref 4.0–10.5)
nRBC: 0 % (ref 0.0–0.2)

## 2023-08-24 LAB — CMP (CANCER CENTER ONLY)
ALT: 10 U/L (ref 0–44)
AST: 15 U/L (ref 15–41)
Albumin: 4.1 g/dL (ref 3.5–5.0)
Alkaline Phosphatase: 59 U/L (ref 38–126)
Anion gap: 7 (ref 5–15)
BUN: 13 mg/dL (ref 8–23)
CO2: 30 mmol/L (ref 22–32)
Calcium: 9.4 mg/dL (ref 8.9–10.3)
Chloride: 100 mmol/L (ref 98–111)
Creatinine: 0.9 mg/dL (ref 0.61–1.24)
GFR, Estimated: >60 mL/min (ref >60)
Glucose, Bld: 214 mg/dL — ABNORMAL HIGH (ref 70–99)
Potassium: 4 mmol/L (ref 3.5–5.1)
Sodium: 137 mmol/L (ref 135–145)
Total Bilirubin: 0.5 mg/dL (ref <1.2)
Total Protein: 6.7 g/dL (ref 6.5–8.1)

## 2023-08-24 MED ORDER — TEMOZOLOMIDE 100 MG PO CAPS: 150.0000 mg/m2/d | ORAL_CAPSULE | Freq: Every day | ORAL | 0 refills | Status: DC

## 2023-08-24 NOTE — Progress Notes (Signed)
[facility] [address]  [place], [place] [address] [phone]   Interval Evaluation  Date of Service: 08/24/23 Patient Name: Alex Church Patient MRN: [id number] Patient DOB: 1946/02/23 Provider: Henreitta Leber, MD  Identifying Statement:  Alex Church is a 77 y.o. male with right frontal glioblastoma   Oncologic History: Oncology History  Glioblastoma, IDH-wildtype (HCC)  05/12/2023 Surgery   Right frontal craniotomy, resection with Dr. Jake Samples; path is glioblastoma IDH-wt   06/09/2023 -  Chemotherapy   Patient is on Treatment Plan : BRAIN GLIOBLASTOMA Radiation Therapy With Concurrent Temozolomide 75 mg/m2 Daily Followed By Sequential Maintenance Temozolomide x 6-12 cycles          Interval History: [name] presents today for follow up, now having completed full 6 weeks of radiation and Temodar.  No new or progressive neurologic symptoms today.  No further seizures, still doing well with Keppra.  Remains active, independent.     H+P (06/02/23) Patient presented to neurologic attention in late August with several days history of left sided weakness, confusion.  Wife noticed he had difficulty getting out of bed, was dragging left side.  This was on top of several months history of progressive confusion, short term memory impairment.  CNS imaging demonstrated a large right frontal mass.  He underwent craniotomy and resection with Dr. Jake Samples, path demonstrated glioblastoma.  Since surgery he has been doing well, walking independently.  Denies seizures, headaches.  He is off decadron.   Medications: Current Outpatient Medications on File Prior to Visit  Medication Sig Dispense Refill   acetaminophen (TYLENOL) 500 MG tablet Take 1,000 mg by mouth every 6 (six) hours as needed for mild pain (pain score 1-3) (Tries to only take twice a week).     allopurinol (ZYLOPRIM) 100 MG tablet Take 1 tablet (100 mg total) by mouth 2 (two) times  daily. 180 tablet 1   atorvastatin (LIPITOR) 10 MG tablet Take 1 tablet (10 mg total) by mouth at bedtime. 90 tablet 1   Blood Glucose Monitoring Suppl (ONETOUCH VERIO FLEX SYSTEM) w/Device KIT Check blood sugar once daily 1 kit 0   carvedilol (COREG) 12.5 MG tablet Take 0.5 tablets (6.25 mg total) by mouth 2 (two) times daily with a meal. 90 tablet 1   cloNIDine (CATAPRES) 0.1 MG tablet Take 1 tablet (0.1 mg total) by mouth 2 (two) times daily. 180 tablet 1   Colchicine 0.6 MG CAPS Take 1 capsule (0.6 mg total) by mouth 2 (two) times daily as needed. 180 capsule 0   Cyanocobalamin (VITAMIN B-12) 1000 MCG/15ML LIQD Take 1,000 mcg by mouth daily.     diphenhydramine-acetaminophen (TYLENOL PM) 25-500 MG TABS tablet Take 1 tablet by mouth at bedtime as needed.     glucose blood test strip Check blood sugars once daily 100 each 12   JANUVIA 100 MG tablet Take 1 tablet (100 mg total) by mouth daily. 90 tablet 1   Lancets (ONETOUCH ULTRASOFT) lancets Check bloods sugars once daily 100 each 12   levETIRAcetam (KEPPRA) 500 MG tablet Take 1 tablet (500 mg total) by mouth 2 (two) times daily. 60 tablet 3   lisinopril (ZESTRIL) 20 MG tablet Take 20 mg by mouth at bedtime.     melatonin 5 MG TABS Take 1 tablet (5 mg total) by mouth at bedtime. 30 tablet 0   metFORMIN (GLUCOPHAGE) 1000 MG tablet Take 0.5 tablets (500 mg total) by mouth 2 (two) times daily  with a meal. 180 tablet 1   ondansetron (ZOFRAN) 8 MG tablet Take 1 tablet (8 mg total) by mouth every 8 (eight) hours as needed for nausea or vomiting. May take 30-60 minutes prior to Temodar administration if nausea/vomiting occurs as needed. 30 tablet 1   pioglitazone (ACTOS) 30 MG tablet Take 1 tablet (30 mg total) by mouth daily. 90 tablet 1   polyethylene glycol (MIRALAX / GLYCOLAX) 17 g packet Take 17 g by mouth daily as needed for mild constipation. 14 each 0   sodium chloride 1 g tablet Take 1 tablet (1 g total) by mouth 3 (three) times daily with  meals. 90 tablet 0   tamsulosin (FLOMAX) 0.4 MG CAPS capsule Take 1 capsule (0.4 mg total) by mouth daily after supper. 90 capsule 0   No current facility-administered medications on file prior to visit.    Allergies: No Known Allergies Past Medical History:  Past Medical History:  Diagnosis Date   B12 deficiency    monthly shots   Diabetes mellitus    Glaucoma suspect    Glioblastoma (HCC)    Hyperlipemia    Hypertension    Past Surgical History:  Past Surgical History:  Procedure Laterality Date   APPLICATION OF CRANIAL NAVIGATION Right 05/12/2023   Procedure: APPLICATION OF CRANIAL NAVIGATION;  Surgeon: Dawley, Alan Mulder, DO;  Location: [facility] OR;  Service: Neurosurgery;  Laterality: Right;   CRANIOTOMY Right 05/12/2023   Procedure: RIGHT STERIOSTACTIC FRONTAL CRANIOTOMY FOR TUMOR RESECTION;  Surgeon: Bethann Goo, DO;  Location: [facility] OR;  Service: Neurosurgery;  Laterality: Right;   Social History:  Social History   Socioeconomic History   Marital status: Married    Spouse name: Not on file   Number of children: 0   Years of education: Not on file   Highest education level: GED or equivalent  Occupational History   Occupation: retired from [employer], works for a lab driving  Tobacco Use   Smoking status: Never   Smokeless tobacco: Never  Substance and Sexual Activity   Alcohol use: No   Drug use: No   Sexual activity: Not on file  Other Topics Concern   Not on file  Social History Narrative   Married (2nd marriage)   Lives w/ wife     Has a rescue dog   Social Drivers of Health   Financial Resource Strain: Low Risk  (07/13/2023)   Overall Financial Resource Strain (CARDIA)    Difficulty of Paying Living Expenses: Not hard at all  Food Insecurity: No Food Insecurity (07/13/2023)   Hunger Vital Sign    Worried About Running Out of Food in the Last Year: Never true    Ran Out of Food in the Last Year: Never true  Transportation Needs: No Transportation Needs (07/13/2023)    PRAPARE - Administrator, Civil Service (Medical): No    Lack of Transportation (Non-Medical): No  Physical Activity: Insufficiently Active (07/13/2023)   Exercise Vital Sign    Days of Exercise per Week: 2 days    Minutes of Exercise per Session: 20 min  Stress: Stress Concern Present (07/13/2023)   [employer] of Occupational Health - Occupational Stress Questionnaire    Feeling of Stress : To some extent  Social Connections: Moderately Isolated (07/13/2023)   Social Connection and Isolation Panel [NHANES]    Frequency of Communication with Friends and Family: Twice a week    Frequency of Social Gatherings with Friends and Family: More than three  times a week    Attends Religious Services: Never    Active Member of Clubs or Organizations: No    Attends Banker Meetings: Not on file    Marital Status: Married  Catering manager Violence: Not At Risk (06/02/2023)   Humiliation, Afraid, Rape, and Kick questionnaire    Fear of Current or Ex-Partner: No    Emotionally Abused: No    Physically Abused: No    Sexually Abused: No   Family History:  Family History  Problem Relation Age of Onset   Diabetes Maternal Uncle    Heart attack Brother 63   Cancer Mother        intra-abdominal   Cirrhosis Cousin        maternal   Diabetes Cousin        maternal   Stroke Neg Hx    Colon cancer Neg Hx    Prostate cancer Neg Hx     Review of Systems: Constitutional: Doesn't report fevers, chills or abnormal weight loss Eyes: Doesn't report blurriness of vision Ears, nose, mouth, throat, and face: Doesn't report sore throat Respiratory: Doesn't report cough, dyspnea or wheezes Cardiovascular: Doesn't report palpitation, chest discomfort  Gastrointestinal:  Doesn't report nausea, constipation, diarrhea GU: Doesn't report incontinence Skin: Doesn't report skin rashes Neurological: Per HPI Musculoskeletal: Doesn't report joint pain Behavioral/Psych: Doesn't  report anxiety  Physical Exam: Vitals:   08/24/23 1329  BP: (!) 145/60  Pulse: 76  Resp: 18  Temp: (!) 97.3 F (36.3 C)  SpO2: 99%    KPS: 80. General: Alert, cooperative, pleasant, in no acute distress Head: Normal EENT: No conjunctival injection or scleral icterus.  Lungs: Resp effort normal Cardiac: Regular rate Abdomen: Non-distended abdomen Skin: No rashes cyanosis or petechiae. Extremities: No clubbing or edema  Neurologic Exam: Mental Status: Awake, alert, attentive to examiner. Oriented to self and environment. Language is fluent with intact comprehension.  Cranial Nerves: Visual acuity is grossly normal. Visual fields are full. Extra-ocular movements intact. No ptosis. Face is symmetric Motor: Tone and bulk are normal. Power is full in both arms and legs. Reflexes are symmetric, no pathologic reflexes present.  Sensory: Intact to light touch Gait: Normal.   Labs: I have reviewed the data as listed    Component Value Date/Time   NA 137 08/24/2023 1250   NA 139 04/06/2022 1047   K 4.0 08/24/2023 1250   CL 100 08/24/2023 1250   CO2 30 08/24/2023 1250   GLUCOSE 214 (H) 08/24/2023 1250   GLUCOSE 147 (H) 08/26/2006 1051   BUN 13 08/24/2023 1250   BUN 17 04/06/2022 1047   CREATININE 0.90 08/24/2023 1250   CREATININE 1.09 06/20/2020 1509   CALCIUM 9.4 08/24/2023 1250   PROT 6.7 08/24/2023 1250   ALBUMIN 4.1 08/24/2023 1250   AST 15 08/24/2023 1250   ALT 10 08/24/2023 1250   ALKPHOS 59 08/24/2023 1250   BILITOT 0.5 08/24/2023 1250   GFRNONAA >60 08/24/2023 1250   GFRAA 126 08/26/2007 0943   Lab Results  Component Value Date   WBC 6.9 08/24/2023   NEUTROABS 5.5 08/24/2023   HGB 13.3 08/24/2023   HCT 41.5 08/24/2023   MCV 95.4 08/24/2023   PLT 145 (L) 08/24/2023   Imaging:  [facility] Clinician Interpretation: I have personally reviewed the CNS images as listed.  My interpretation, in the context of the patient's clinical presentation, is treatment effect vs  true progression  MR BRAIN W WO CONTRAST Result Date: 08/20/2023 CLINICAL DATA:  Provided history: Glioblastoma, IDH-wildtype. Brain/CNS neoplasm, assess treatment response. EXAM: MRI HEAD WITHOUT AND WITH CONTRAST TECHNIQUE: Multiplanar, multiecho pulse sequences of the brain and surrounding structures were obtained without and with intravenous contrast. CONTRAST:  8mL GADAVIST GADOBUTROL 1 MMOL/ML IV SOLN COMPARISON:  Prior brain MRI examinations 07/12/2023 and earlier. Head CT 07/11/2023. FINDINGS: Brain: Post-operative changes from prior right frontoparietal craniotomy for debulking of a right frontal lobe tumor. Chronic blood products again noted along the periphery of the resection cavity. Curvilinear and mildly irregular enhancement along the margins of the resection cavity has not significantly changed from the prior brain MRI of 07/12/2023. Progressive T2 FLAIR hyperintense signal abnormality within the cortex and subcortical white matter along the posteromedial aspect of the resection cavity (within the medial right frontal lobe), with associated gyral swelling, most suspicious for progressive tumor (for instance as seen on series 8, image 22). Progressive T2 FLAIR hyperintense signal abnormality surrounding the resection cavity elsewhere may reflect edema and/or nonenhancing infiltrative tumor (see annotations on series 8). Mild multifocal T2 FLAIR hyperintense signal abnormality elsewhere within the cerebral white matter and pons, nonspecific but compatible with chronic small vessel ischemic disease. An extra-axial fluid collection overlying the right frontal lobe (deep to the cranioplasty) has slightly increased in size, now measuring up to 12 mm in thickness (previously 8 mm). An extra-axial collection more posteriorly along the right parietal lobe has decreased in size, now measuring up to 6 mm in thickness (previously 12 mm). An extra-axial fluid collection along the right aspect of the mid to  anterior falx has remained stable in size (again measuring up to 12 mm in thickness). Dural thickening and enhancement overlying the right greater than left cerebral hemispheres is similar to the prior exam and likely postoperative. Mild generalized cerebral atrophy. There is no acute infarct. No midline shift. Vascular: Maintained flow voids within the proximal large arterial vessels. Skull and upper cervical spine: Right frontoparietal cranioplasty. No focal worrisome marrow lesion. Sinuses/Orbits: No mass or acute finding within the imaged orbits. No significant paranasal sinus disease. Impression #1 will be called to the ordering clinician or representative by the Radiologist Assistant, and communication documented in the PACS or [employer]. IMPRESSION: 1. Post-operative changes from prior debulking of a right frontal lobe tumor. Increased T2 FLAIR hyperintense signal abnormality within the right frontal lobe cortex and subcortical white matter along the posteromedial aspect of the resection cavity (with associated gyral swelling) is most suspicious for progressive tumor. Progressive T2 FLAIR hyperintense signal abnormality surrounding the resection cavity elsewhere may reflect edema and/or non-enhancing infiltrative tumor. Curvilinear and somewhat irregular enhancement along the margins of the resection cavity has remained stable. 2. An extra-axial collection overlying the right frontal lobe has slightly increased in size, now measuring up to 12 mm in thickness. Additional extra-axial collections overlying the right frontal and right parietal lobes have remained stable or have decreased in size. No midline shift. Electronically Signed   By: Jackey Loge D.O.   On: 08/20/2023 10:33   LONG TERM MONITOR (3-14 DAYS) Result Date: 08/04/2023 NSR (ave 78/min) with sinus tachycardia (126/min) NSVT, 5 beats at 174/min NS SVT, 8 beats at 126/mn No atrial fib or flutter No sustained VT or SVT No prolonged pauses  No associated symptoms. Less than 1% PAC's and PVC's Patch Wear Time:  7 days and 0 hours (2024-11-07T20:00:27-0500 to 2024-11-14T20:34:45-0500)   Assessment/Plan Glioblastoma, IDH-wildtype (HCC)  Seizure (HCC)  Doris Cheadle Mcmartin is clinically stable today, now having completed full 6 weeks  of IMRT and Temodar.  MRI brain demonstrates stable post-contrast sequences, but some increase in T2/FLAIR surrounding the resection cavity which is non-specific at this point.  We recommended initiating treatment with cycle #1 Temozolomide 150 mg/m2, on for five days and off for twenty three days in twenty eight day cycles. The patient will have a complete blood count performed on days 21 and 28 of each cycle, and a comprehensive metabolic panel performed on day 28 of each cycle. Labs may need to be performed more often. Zofran will prescribed for home use for nausea/vomiting.   Informed consent was obtained verbally at bedside to proceed with oral chemotherapy.  Chemotherapy should be held for the following:  ANC less than 1,000  Platelets less than 100,000  LFT or creatinine greater than 2x ULN  If clinical concerns/contraindications develop  Keppra should continue at 500mg  BID.  All questions were answered. The patient knows to call the clinic with any problems, questions or concerns. No barriers to learning were detected.  We ask that KAWLIGA CRUMITY return to clinic in 1 months with labs prior to cycle #2, or sooner as needed.  The total time spent in the encounter was 40 minutes and more than 50% was on counseling and review of test results   Henreitta Leber, MD Medical Director of Neuro-Oncology [facility] at [facility] 08/24/23 1:40 PM

## 2023-08-24 NOTE — Telephone Encounter (Signed)
Oral Oncology Patient Advocate Encounter  After completing a benefits investigation, prior authorization for Temozolomide is not required at this time through [facility] Part D.  Patient's copay is $44.03.     Ardeen Fillers, CPhT Oncology Pharmacy Patient Advocate  [facility]  [phone] (phone) [phone] (fax) 08/24/2023 2:30 PM

## 2023-08-24 NOTE — Telephone Encounter (Signed)
Oral Oncology Pharmacist Encounter  Received new prescription for Temodar (temozolomide) for the maintenace treatment of GBM, planned duration 6-12 cycles.  CBC w/ Diff and CMP from 08/24/23 assessed, no relevant lab abnormalities requiring baseline dose adjustment required at this time. Prescription dose and frequency assessed for appropriateness.  Current medication list in Epic reviewed, no relevant/significant DDIs with Temodar identified.  Evaluated chart and no patient barriers to medication adherence noted.   Patient agreement for treatment documented in MD note on 08/24/23.  Prescription has been e-scribed to the [facility] for benefits analysis and approval.  Oral Oncology Clinic will continue to follow for insurance authorization, copayment issues, initial counseling and start date.  Sherry Ruffing, PharmD, BCPS, BCOP Hematology/Oncology Clinical Pharmacist [facility] Oral Chemotherapy Navigation Clinics [phone] 08/24/2023 2:54 PM

## 2023-08-25 ENCOUNTER — Other Ambulatory Visit (HOSPITAL_COMMUNITY): Payer: Self-pay

## 2023-08-25 ENCOUNTER — Other Ambulatory Visit: Payer: Self-pay | Admitting: Pharmacy Technician

## 2023-08-25 ENCOUNTER — Other Ambulatory Visit: Payer: Self-pay

## 2023-08-25 MED ORDER — TEMOZOLOMIDE 100 MG PO CAPS: 150.0000 mg/m2/d | ORAL_CAPSULE | Freq: Every day | ORAL | 0 refills | Status: DC

## 2023-08-25 MED ORDER — TEMOZOLOMIDE 100 MG PO CAPS
150.0000 mg/m2/d | ORAL_CAPSULE | Freq: Every day | ORAL | 0 refills | Status: DC
  Filled 2023-08-25: qty 15, 28d supply, fill #0

## 2023-08-25 NOTE — Progress Notes (Signed)
Oral Chemotherapy Pharmacist Encounter  Patient was counseled under telephone encounter from 08/24/23.  Sherry Ruffing, PharmD, BCPS, BCOP Hematology/Oncology Clinical Pharmacist [facility] and [facility] Oral Chemotherapy Navigation Clinics [phone] 08/25/2023 10:14 AM

## 2023-08-25 NOTE — Telephone Encounter (Signed)
Oral Chemotherapy Pharmacist Encounter  I spoke with patient and patient's wife for overview of: Temodar (temozolomide) for the maintenance treatment of glioblastoma multiforme, planned duration 6-12 months of treatment.  Counseled on administration, dosing, side effects, monitoring, drug-food interactions, safe handling, storage, and disposal.  Patient will take Temodar 100mg  capsules, 3 capsules, 300mg  total daily dose, by mouth once daily, may take at bedtime and on an empty stomach to decrease nausea and vomiting.  If 1st cycle is well tolerated, patient's wife informed that Temodar dose may be increased to 200 mg/m2 daily for 5 days on, 23 days off, repeated every 28 days for subsequent cycles   Patient will take Temodar daily for 5 days on, 23 days off, and repeated.  Temodar start date: 09/04/23 PM    Adverse effects include but are not limited to: nausea, vomiting, anorexia, GI upset, rash, and fatigue. N/V PPX: Patient will take Zofran 8mg  tablet, 1 tablet by mouth 30-60 min prior to Temodar dose to help decrease N/V.  Reviewed importance of keeping a medication schedule and plan for any missed doses. No barriers to medication adherence identified.  Medication reconciliation performed and medication/allergy list updated.  All questions answered.  Mr. And Ms. Creely voiced understanding and appreciation.   Medication education handout placed in mail for patient. Patient knows to call the office with questions or concerns. Oral Chemotherapy Clinic phone number provided.   Lenord Carbo, PharmD, BCPS Hematology/Oncology Clinical Pharmacist [facility] Oral Chemotherapy Navigation Clinic [phone] 08/25/2023 10:02 AM

## 2023-08-25 NOTE — Progress Notes (Signed)
Specialty Pharmacy Initial Fill Coordination Note  Alex Church is a 77 y.o. male contacted today regarding refills of specialty medication(s) Temozolomide (TEMODAR) .  Patient requested Alex Church at [facility] [place]  on 08/26/23   Medication will be filled on 08/25/23.   Patient is aware of $44.03 copayment.

## 2023-08-25 NOTE — Telephone Encounter (Signed)
Called patient to scheduled 4 week f/u. Patient scheduled for 12/19.Marland Kitchen

## 2023-08-27 ENCOUNTER — Ambulatory Visit: Payer: Medicare Other | Admitting: Internal Medicine

## 2023-08-27 ENCOUNTER — Other Ambulatory Visit: Payer: Medicare Other

## 2023-08-28 ENCOUNTER — Other Ambulatory Visit (HOSPITAL_COMMUNITY): Payer: Self-pay

## 2023-08-31 ENCOUNTER — Other Ambulatory Visit: Payer: Medicare Other

## 2023-08-31 ENCOUNTER — Ambulatory Visit: Payer: Medicare Other | Admitting: Internal Medicine

## 2023-09-03 ENCOUNTER — Ambulatory Visit: Payer: Medicare Other | Admitting: Radiation Oncology

## 2023-09-04 ENCOUNTER — Encounter: Payer: Self-pay | Admitting: Internal Medicine

## 2023-09-04 ENCOUNTER — Other Ambulatory Visit (HOSPITAL_COMMUNITY): Payer: Self-pay

## 2023-09-04 ENCOUNTER — Telehealth: Payer: Self-pay

## 2023-09-04 MED ORDER — DEXAMETHASONE 4 MG PO TABS
4.0000 mg | ORAL_TABLET | Freq: Every day | ORAL | 0 refills | Status: DC
  Filled 2023-09-04: qty 30, 30d supply, fill #0

## 2023-09-04 NOTE — Telephone Encounter (Signed)
Alex Church advised of new prescription for decadron and to continue Temodar.  She will call back if symptoms do not improve

## 2023-09-04 NOTE — Telephone Encounter (Signed)
T/C from pt's wife asking if Nadine Counts needs to be prescribed decadron for his inflammation. He has finished his radiation treatments but he is having increased weakness on his left side and especially his left leg.  He is also suppose to start back on Temodar tonight and she wants to know if it os OK for him to start.  Please advise

## 2023-09-06 ENCOUNTER — Other Ambulatory Visit: Payer: Self-pay | Admitting: Internal Medicine

## 2023-09-06 ENCOUNTER — Other Ambulatory Visit (HOSPITAL_COMMUNITY): Payer: Self-pay

## 2023-09-07 ENCOUNTER — Other Ambulatory Visit (HOSPITAL_COMMUNITY): Payer: Self-pay

## 2023-09-07 MED ORDER — TAMSULOSIN HCL 0.4 MG PO CAPS
0.4000 mg | ORAL_CAPSULE | Freq: Every day | ORAL | 1 refills | Status: DC
  Filled 2023-09-07: qty 90, 90d supply, fill #0

## 2023-09-08 ENCOUNTER — Other Ambulatory Visit: Payer: Self-pay

## 2023-09-09 ENCOUNTER — Other Ambulatory Visit: Payer: Self-pay

## 2023-09-10 ENCOUNTER — Other Ambulatory Visit (HOSPITAL_COMMUNITY): Payer: Self-pay

## 2023-09-10 ENCOUNTER — Ambulatory Visit
Admission: RE | Admit: 2023-09-10 | Discharge: 2023-09-10 | Disposition: A | Discharge status: Home / Self Care | Payer: Medicare Other | Source: Ambulatory Visit | Attending: Radiation Oncology | Admitting: Radiation Oncology

## 2023-09-10 ENCOUNTER — Encounter: Payer: Self-pay | Admitting: Internal Medicine

## 2023-09-10 ENCOUNTER — Encounter: Payer: Self-pay | Admitting: Radiation Oncology

## 2023-09-10 NOTE — Progress Notes (Addendum)
 Alex Church Comings identity was verified. Patient received radiation treatment to the Brain.     They completed their radiation on:   Does the patient complain of any of the following: Headache: No Visual Changes: Patient reports having cataracts to both eyes, to have removed in July.  Hearing Changes: No Nausea: Yes Vomiting: Yes- intermittent. Wife is concerned about abrupt vomiting.  Balance or coordination issues: Yes, left side weakness and gait disturbances.  Memory issues: Yes, short term memory loss.    Is the patient currently on a Decadron  regimen? : 4 mg daily  Additional comments if applicable:

## 2023-09-14 ENCOUNTER — Telehealth: Payer: Self-pay | Admitting: *Deleted

## 2023-09-14 ENCOUNTER — Other Ambulatory Visit: Payer: Self-pay | Admitting: Internal Medicine

## 2023-09-14 ENCOUNTER — Other Ambulatory Visit (HOSPITAL_COMMUNITY): Payer: Self-pay

## 2023-09-14 MED ORDER — LINAGLIPTIN 5 MG PO TABS
5.0000 mg | ORAL_TABLET | Freq: Every day | ORAL | 3 refills | Status: DC
  Filled 2023-09-14: qty 30, 30d supply, fill #0

## 2023-09-14 NOTE — Telephone Encounter (Signed)
 [name]'s wife Arsalan Brisbin [phone]) calling about [place]  [employer] form they faxed to Dr. Eward office but what they received was blank.  He has brain cancer, has a seizure and unable to drive or work.  What do we need to do to have paperwork completed?  During call, located blank [employer] form in Media notes and correspondence faxed to [phone] which is ([place]) [facility].  Advised to sign an authorization form and allow 7 - 10 business days (14 calendar to process.  [facility] then will fax records request to ([place]) H.I.M.  This office is allowed thirty calendar days to submit records upon their receipt of request and authorization form.   I will send records to [facility] through MyChart.  Currently no further questions or needs.  [place] Authorization emailed and sent through MyChart

## 2023-09-15 ENCOUNTER — Other Ambulatory Visit: Payer: Self-pay

## 2023-09-15 ENCOUNTER — Other Ambulatory Visit (HOSPITAL_COMMUNITY): Payer: Self-pay

## 2023-09-15 MED ORDER — PIOGLITAZONE HCL 45 MG PO TABS
45.0000 mg | ORAL_TABLET | Freq: Every day | ORAL | 1 refills | Status: DC
  Filled 2023-09-15: qty 30, 30d supply, fill #0
  Filled 2023-09-15: qty 60, 60d supply, fill #0

## 2023-09-15 MED ORDER — METFORMIN HCL 1000 MG PO TABS
1000.0000 mg | ORAL_TABLET | Freq: Two times a day (BID) | ORAL | 1 refills | Status: DC
  Filled 2023-09-15: qty 180, 90d supply, fill #0

## 2023-09-15 NOTE — Addendum Note (Signed)
 Addended byConrad [place] D on: 09/15/2023 12:19 PM   Modules accepted: Orders

## 2023-09-15 NOTE — Telephone Encounter (Addendum)
 Remove Januvia and Tradjenta.  Send a new prescription for: Pioglitazone 45 mg 1 tablet daily Also on metformin 1000 mg 1 tablet twice daily

## 2023-09-15 NOTE — Telephone Encounter (Signed)
 Rx's sent. Januvia and Tradjenta d/c.

## 2023-09-16 ENCOUNTER — Other Ambulatory Visit: Payer: Self-pay

## 2023-09-16 NOTE — Telephone Encounter (Signed)
 Received signed ROI.  Connected with Karlo Goeden [phone]) as requested to confirm receipt.  Is there anything else I need to do to have this completed? Advised another form staff working on form at this time.  Currently no further questions or needs.

## 2023-09-17 ENCOUNTER — Other Ambulatory Visit: Payer: Self-pay

## 2023-09-17 NOTE — Progress Notes (Signed)
 Specialty Pharmacy Ongoing Clinical Assessment Note  Alex Church is a 78 y.o. male who is being followed by the specialty pharmacy service for RxSp Oncology   Patient's specialty medication(s) reviewed today: Temozolomide  (TEMODAR )   Missed doses in the last 4 weeks: 0   Patient/Caregiver did not have any additional questions or concerns.   Therapeutic benefit summary: Unable to assess   Adverse events/side effects summary: Experienced adverse events/side effects (Wife reports fatigue and random vomitting.)  Patient used Zofran  only during the 5 days of taking Temodar .   Patient's therapy is appropriate to: Continue    Goals Addressed             This Visit's Progress    Stabilization of disease       Patient is unable to be assessed as therapy was recently initiated. Patient will be evaluated at upcoming provider appointment to assess progress         Follow up:  3 months  Mitzie GORMAN [facility] Specialty Pharmacist

## 2023-09-21 ENCOUNTER — Telehealth: Payer: Self-pay

## 2023-09-21 NOTE — Telephone Encounter (Signed)
 Notified patient's spouse that his [place]  [employer] claim d document had ben completed and faxed back to company. Fax confirmation received. Request for records faxed to [facility]. Copy of documents mailed to patient per request. No further concerns at this time.

## 2023-09-24 ENCOUNTER — Other Ambulatory Visit (HOSPITAL_COMMUNITY): Payer: Self-pay

## 2023-09-30 ENCOUNTER — Encounter (HOSPITAL_COMMUNITY): Payer: Self-pay

## 2023-09-30 ENCOUNTER — Other Ambulatory Visit: Payer: Self-pay

## 2023-09-30 ENCOUNTER — Other Ambulatory Visit (HOSPITAL_COMMUNITY): Payer: Self-pay

## 2023-09-30 ENCOUNTER — Other Ambulatory Visit: Payer: Self-pay | Admitting: Internal Medicine

## 2023-09-30 ENCOUNTER — Encounter: Payer: Self-pay | Admitting: Internal Medicine

## 2023-09-30 NOTE — Telephone Encounter (Signed)
Restart Januvia, Rx x 6 months

## 2023-10-01 ENCOUNTER — Other Ambulatory Visit: Payer: Self-pay

## 2023-10-01 ENCOUNTER — Other Ambulatory Visit: Payer: Self-pay | Admitting: *Deleted

## 2023-10-01 ENCOUNTER — Inpatient Hospital Stay: Payer: Medicare Other | Attending: Internal Medicine

## 2023-10-01 ENCOUNTER — Inpatient Hospital Stay: Payer: Medicare Other | Admitting: Internal Medicine

## 2023-10-01 ENCOUNTER — Other Ambulatory Visit (HOSPITAL_COMMUNITY): Payer: Self-pay

## 2023-10-01 ENCOUNTER — Telehealth: Payer: Self-pay | Admitting: *Deleted

## 2023-10-01 ENCOUNTER — Inpatient Hospital Stay: Payer: Medicare Other | Admitting: Licensed Clinical Social Worker

## 2023-10-01 VITALS — BP 115/63 | HR 72 | Temp 97.6°F | Resp 15 | Ht 72.0 in | Wt 186.4 lb

## 2023-10-01 DIAGNOSIS — C719 Malignant neoplasm of brain, unspecified: Secondary | ICD-10-CM

## 2023-10-01 DIAGNOSIS — Z79899 Other long term (current) drug therapy: Secondary | ICD-10-CM | POA: Insufficient documentation

## 2023-10-01 DIAGNOSIS — R569 Unspecified convulsions: Secondary | ICD-10-CM | POA: Insufficient documentation

## 2023-10-01 DIAGNOSIS — C711 Malignant neoplasm of frontal lobe: Secondary | ICD-10-CM | POA: Diagnosis not present

## 2023-10-01 DIAGNOSIS — R270 Ataxia, unspecified: Secondary | ICD-10-CM

## 2023-10-01 LAB — CMP (CANCER CENTER ONLY)
ALT: 19 U/L (ref 0–44)
AST: 10 U/L — ABNORMAL LOW (ref 15–41)
Albumin: 3.7 g/dL (ref 3.5–5.0)
Alkaline Phosphatase: 69 U/L (ref 38–126)
Anion gap: 8 (ref 5–15)
BUN: 23 mg/dL (ref 8–23)
CO2: 31 mmol/L (ref 22–32)
Calcium: 9.9 mg/dL (ref 8.9–10.3)
Chloride: 97 mmol/L — ABNORMAL LOW (ref 98–111)
Creatinine: 1.06 mg/dL (ref 0.61–1.24)
GFR, Estimated: >60 mL/min (ref >60)
Glucose, Bld: 185 mg/dL — ABNORMAL HIGH (ref 70–99)
Potassium: 4.1 mmol/L (ref 3.5–5.1)
Sodium: 136 mmol/L (ref 135–145)
Total Bilirubin: 0.4 mg/dL (ref 0.0–1.2)
Total Protein: 6.4 g/dL — ABNORMAL LOW (ref 6.5–8.1)

## 2023-10-01 LAB — CBC WITH DIFFERENTIAL (CANCER CENTER ONLY)
Abs Immature Granulocytes: 0.13 10*3/uL — ABNORMAL HIGH (ref 0.00–0.07)
Basophils Absolute: 0 10*3/uL (ref 0.0–0.1)
Basophils Relative: 0 %
Eosinophils Absolute: 0 10*3/uL (ref 0.0–0.5)
Eosinophils Relative: 0 %
HCT: 42.4 % (ref 39.0–52.0)
Hemoglobin: 13.9 g/dL (ref 13.0–17.0)
Immature Granulocytes: 2 %
Lymphocytes Relative: 10 %
Lymphs Abs: 0.8 10*3/uL (ref 0.7–4.0)
MCH: 30.6 pg (ref 26.0–34.0)
MCHC: 32.8 g/dL (ref 30.0–36.0)
MCV: 93.4 fL (ref 80.0–100.0)
Monocytes Absolute: 0.6 10*3/uL (ref 0.1–1.0)
Monocytes Relative: 7 %
Neutro Abs: 6.8 10*3/uL (ref 1.7–7.7)
Neutrophils Relative %: 81 %
Platelet Count: 109 10*3/uL — ABNORMAL LOW (ref 150–400)
RBC: 4.54 MIL/uL (ref 4.22–5.81)
RDW: 14.5 % (ref 11.5–15.5)
WBC Count: 8.4 10*3/uL (ref 4.0–10.5)
nRBC: 0 % (ref 0.0–0.2)

## 2023-10-01 MED ORDER — DEXAMETHASONE 4 MG PO TABS
4.0000 mg | ORAL_TABLET | Freq: Every day | ORAL | 0 refills | Status: DC
  Filled 2023-10-01: qty 60, 60d supply, fill #0

## 2023-10-01 MED ORDER — SITAGLIPTIN PHOSPHATE 100 MG PO TABS
100.0000 mg | ORAL_TABLET | Freq: Every day | ORAL | 1 refills | Status: DC
  Filled 2023-10-01 – 2023-10-05 (×2): qty 90, 90d supply, fill #0
  Filled ????-??-??: fill #0

## 2023-10-01 NOTE — Telephone Encounter (Signed)
[facility] for home health skilled Nursing and PT. Referral accepted and they will contact pt/wife next week

## 2023-10-01 NOTE — Telephone Encounter (Signed)
Rx sent 

## 2023-10-01 NOTE — Addendum Note (Signed)
Addended by: Conrad [place] D on: 10/01/2023 07:39 AM   Modules accepted: Orders

## 2023-10-01 NOTE — Telephone Encounter (Signed)
TCT pt's wife, [name]. Spoke with her. Advised that a home health referral has been made for her husband through [facility] ([facility]). Advised that she can expect a call from them the beginning of next week.

## 2023-10-01 NOTE — Progress Notes (Signed)
[facility] CSW Progress Note  Visual merchandiser  spoke w/ pt's wife over the phone to discuss options for assistance at home.  Pt has recently experienced increased weakness on the left side which has led to multiple falls.  Pt's medication regiment changed today w/ the hope of improvement in these symptoms.  Per wife, she works full time and is not able to provide the supervision pt requires at this time.    CSW discussed Medicare benefits at length distinguishing between skilled care and custodial care.  Custodial care is not covered under Medicare benefits.  Pt does not have a long term care policy.  A home care referral was placed today for pt; however, CSW informed pt's wife it will not provide someone to stay at the home w/ pt while she is at work.  CSW provided pt's wife w/ the telephone number for a contact at [employer] to explore long term options as well as contact information for a private pay agency to hire someone to stay at the home while pt's wife is working.  Pt's wife provided w/ contact information for CSW.  CSW to provide support as appropriate throughout duration of treatment.      Rachel Moulds, LCSW Clinical Social Worker [facility]

## 2023-10-01 NOTE — Progress Notes (Signed)
[facility] [address]  [place], [place] [address] [phone]   Interval Evaluation  Date of Service: 10/01/23 Patient Name: Alex Church Patient MRN: [id number] Patient DOB: [birth year] Provider: Henreitta Leber, MD  Identifying Statement:  Alex Church is a 78 y.o. male with right frontal glioblastoma   Oncologic History: Oncology History  Glioblastoma, IDH-wildtype (HCC)  05/12/2023 Surgery   Right frontal craniotomy, resection with Dr. Jake Samples; path is glioblastoma IDH-wt   06/09/2023 -  Chemotherapy   Patient is on Treatment Plan : BRAIN GLIOBLASTOMA Radiation Therapy With Concurrent Temozolomide 75 mg/m2 Daily Followed By Sequential Maintenance Temozolomide x 6-12 cycles          Interval History: Alex Church presents today for follow up, following first cycle of 5-day Temodar.  He describes worsening left leg weakness, leading to multiple falls.  He is shuffling around the home, now requiring use of walker, new from prior.  No complaint with the left arm.  No further seizures, still doing well with Keppra.  Dosing decadron 4mg  daily since phone call on 09/04/23.  No side effects from the steroid that he can appreciate.  H+P (06/02/23) Patient presented to neurologic attention in late August with several days history of left sided weakness, confusion.  Wife noticed he had difficulty getting out of bed, was dragging left side.  This was on top of several months history of progressive confusion, short term memory impairment.  CNS imaging demonstrated a large right frontal mass.  He underwent craniotomy and resection with Dr. Jake Samples, path demonstrated glioblastoma.  Since surgery he has been doing well, walking independently.  Denies seizures, headaches.  He is off decadron.   Medications: Current Outpatient Medications on File Prior to Visit  Medication Sig Dispense Refill   acetaminophen (TYLENOL) 500 MG tablet Take 1,000 mg by  mouth every 6 (six) hours as needed for mild pain (pain score 1-3) (Tries to only take twice a week).     allopurinol (ZYLOPRIM) 100 MG tablet Take 1 tablet (100 mg total) by mouth 2 (two) times daily. 180 tablet 1   atorvastatin (LIPITOR) 10 MG tablet Take 1 tablet (10 mg total) by mouth at bedtime. 90 tablet 1   Blood Glucose Monitoring Suppl (ONETOUCH VERIO FLEX SYSTEM) w/Device KIT Check blood sugar once daily 1 kit 0   carvedilol (COREG) 12.5 MG tablet Take 0.5 tablets (6.25 mg total) by mouth 2 (two) times daily with a meal. 90 tablet 1   cloNIDine (CATAPRES) 0.1 MG tablet Take 1 tablet (0.1 mg total) by mouth 2 (two) times daily. 180 tablet 1   Colchicine 0.6 MG CAPS Take 1 capsule (0.6 mg total) by mouth 2 (two) times daily as needed. 180 capsule 0   Cyanocobalamin (VITAMIN B-12) 1000 MCG/15ML LIQD Take 1,000 mcg by mouth daily.     dexamethasone (DECADRON) 4 MG tablet Take 1 tablet (4 mg total) by mouth daily. 30 tablet 0   diphenhydramine-acetaminophen (TYLENOL PM) 25-500 MG TABS tablet Take 1 tablet by mouth at bedtime as needed.     glucose blood test strip Check blood sugars once daily 100 each 12   Lancets (ONETOUCH ULTRASOFT) lancets Check bloods sugars once daily 100 each 12   levETIRAcetam (KEPPRA) 500 MG tablet Take 1 tablet (500 mg total) by mouth 2 (two) times daily. 60 tablet 3   lisinopril (ZESTRIL) 20 MG tablet Take 20 mg by mouth at bedtime.  melatonin 5 MG TABS Take 1 tablet (5 mg total) by mouth at bedtime. 30 tablet 0   metFORMIN (GLUCOPHAGE) 1000 MG tablet Take 1 tablet (1,000 mg total) by mouth 2 (two) times daily with a meal. 180 tablet 1   ondansetron (ZOFRAN) 8 MG tablet Take 1 tablet (8 mg total) by mouth every 8 (eight) hours as needed for nausea or vomiting. May take 30-60 minutes prior to Temodar administration if nausea/vomiting occurs as needed. 30 tablet 1   pioglitazone (ACTOS) 45 MG tablet Take 1 tablet (45 mg total) by mouth daily. 90 tablet 1    polyethylene glycol (MIRALAX / GLYCOLAX) 17 g packet Take 17 g by mouth daily as needed for mild constipation. 14 each 0   sitaGLIPtin (JANUVIA) 100 MG tablet Take 1 tablet (100 mg total) by mouth daily. 90 tablet 1   sodium chloride 1 g tablet Take 1 tablet (1 g total) by mouth 3 (three) times daily with meals. 90 tablet 0   tamsulosin (FLOMAX) 0.4 MG CAPS capsule Take 1 capsule (0.4 mg total) by mouth daily after supper. 90 capsule 1   temozolomide (TEMODAR) 100 MG capsule Take 3 capsules (300 mg total) by mouth at bedtime. Take for 5 days on, then off for 23 days. Repeat every 28 days. Take on an empty stomach to decrease nausea & vomiting. 15 capsule 0   No current facility-administered medications on file prior to visit.    Allergies: No Known Allergies Past Medical History:  Past Medical History:  Diagnosis Date   B12 deficiency    monthly shots   Diabetes mellitus    Glaucoma suspect    Glioblastoma (HCC)    Hyperlipemia    Hypertension    Past Surgical History:  Past Surgical History:  Procedure Laterality Date   APPLICATION OF CRANIAL NAVIGATION Right 05/12/2023   Procedure: APPLICATION OF CRANIAL NAVIGATION;  Surgeon: Dawley, Alan Mulder, DO;  Location: [facility] OR;  Service: Neurosurgery;  Laterality: Right;   CRANIOTOMY Right 05/12/2023   Procedure: RIGHT STERIOSTACTIC FRONTAL CRANIOTOMY FOR TUMOR RESECTION;  Surgeon: Bethann Goo, DO;  Location: [facility] OR;  Service: Neurosurgery;  Laterality: Right;   Social History:  Social History   Socioeconomic History   Marital status: Married    Spouse name: Not on file   Number of children: 0   Years of education: Not on file   Highest education level: GED or equivalent  Occupational History   Occupation: retired from [employer], works for a lab driving  Tobacco Use   Smoking status: Never   Smokeless tobacco: Never  Substance and Sexual Activity   Alcohol use: No   Drug use: No   Sexual activity: Not on file  Other Topics Concern    Not on file  Social History Narrative   Married (2nd marriage)   Lives w/ wife     Has a rescue dog   Social Drivers of Health   Financial Resource Strain: Low Risk  (07/13/2023)   Overall Financial Resource Strain (CARDIA)    Difficulty of Paying Living Expenses: Not hard at all  Food Insecurity: No Food Insecurity (07/13/2023)   Hunger Vital Sign    Worried About Running Out of Food in the Last Year: Never true    Ran Out of Food in the Last Year: Never true  Transportation Needs: No Transportation Needs (07/13/2023)   PRAPARE - Administrator, Civil Service (Medical): No    Lack of Transportation (Non-Medical): No  Physical Activity: Insufficiently Active (07/13/2023)   Exercise Vital Sign    Days of Exercise per Week: 2 days    Minutes of Exercise per Session: 20 min  Stress: Stress Concern Present (07/13/2023)   [employer] of Occupational Health - Occupational Stress Questionnaire    Feeling of Stress : To some extent  Social Connections: Moderately Isolated (07/13/2023)   Social Connection and Isolation Panel [NHANES]    Frequency of Communication with Friends and Family: Twice a week    Frequency of Social Gatherings with Friends and Family: More than three times a week    Attends Religious Services: Never    Database administrator or Organizations: No    Attends Engineer, structural: Not on file    Marital Status: Married  Catering manager Violence: Not At Risk (06/02/2023)   Humiliation, Afraid, Rape, and Kick questionnaire    Fear of Current or Ex-Partner: No    Emotionally Abused: No    Physically Abused: No    Sexually Abused: No   Family History:  Family History  Problem Relation Age of Onset   Diabetes Maternal Uncle    Heart attack Brother 35   Cancer Mother        intra-abdominal   Cirrhosis Cousin        maternal   Diabetes Cousin        maternal   Stroke Neg Hx    Colon cancer Neg Hx    Prostate cancer Neg Hx     Review of  Systems: Constitutional: Doesn't report fevers, chills or abnormal weight loss Eyes: Doesn't report blurriness of vision Ears, nose, mouth, throat, and face: Doesn't report sore throat Respiratory: Doesn't report cough, dyspnea or wheezes Cardiovascular: Doesn't report palpitation, chest discomfort  Gastrointestinal:  Doesn't report nausea, constipation, diarrhea GU: Doesn't report incontinence Skin: Doesn't report skin rashes Neurological: Per HPI Musculoskeletal: Doesn't report joint pain Behavioral/Psych: Doesn't report anxiety  Physical Exam: Vitals:   10/01/23 1120  BP: 115/63  Pulse: 72  Resp: 15  Temp: 97.6 F (36.4 C)  SpO2: 99%    KPS: 70. General: Alert, cooperative, pleasant, in no acute distress Head: Normal EENT: No conjunctival injection or scleral icterus.  Lungs: Resp effort normal Cardiac: Regular rate Abdomen: Non-distended abdomen Skin: No rashes cyanosis or petechiae. Extremities: No clubbing or edema  Neurologic Exam: Mental Status: Awake, alert, attentive to examiner. Oriented to self and environment. Language is fluent with intact comprehension.  Cranial Nerves: Visual acuity is grossly normal. Visual fields are full. Extra-ocular movements intact. No ptosis. Face is symmetric Motor: Tone and bulk are normal. Power is 4+/5 in left leg. Reflexes are symmetric, no pathologic reflexes present.  Sensory: Intact to light touch Gait: Dystaxic   Labs: I have reviewed the data as listed    Component Value Date/Time   NA 136 10/01/2023 1046   NA 139 04/06/2022 1047   K 4.1 10/01/2023 1046   CL 97 (L) 10/01/2023 1046   CO2 31 10/01/2023 1046   GLUCOSE 185 (H) 10/01/2023 1046   GLUCOSE 147 (H) 08/26/2006 1051   BUN 23 10/01/2023 1046   BUN 17 04/06/2022 1047   CREATININE 1.06 10/01/2023 1046   CREATININE 1.09 06/20/2020 1509   CALCIUM 9.9 10/01/2023 1046   PROT 6.4 (L) 10/01/2023 1046   ALBUMIN 3.7 10/01/2023 1046   AST 10 (L) 10/01/2023 1046    ALT 19 10/01/2023 1046   ALKPHOS 69 10/01/2023 1046   BILITOT 0.4 10/01/2023  1046   GFRNONAA >60 10/01/2023 1046   GFRAA 126 08/26/2007 0943   Lab Results  Component Value Date   WBC 8.4 10/01/2023   NEUTROABS 6.8 10/01/2023   HGB 13.9 10/01/2023   HCT 42.4 10/01/2023   MCV 93.4 10/01/2023   PLT 109 (L) 10/01/2023     Assessment/Plan Glioblastoma, IDH-wildtype (HCC)  Seizure (HCC)  Doris Cheadle Leighton is clinically progressive today, with modest worsening of motor function, left leg weakness.  Etiology is either tumor progression or post-RT inflammation, or mixed.  Temodar was not very well tolerated cycle 1, and CBC is notable today for thrombocytopenia.  We recommended holding off on cycle #2 of Temodar, and instead obtaining an updated MRI study.  Also, decadron can increase to 4mg  BID given ongoing symptoms.    Keppra should continue at 500mg  BID.  Will also place order for home health given new functional limitations.  All questions were answered. The patient knows to call the clinic with any problems, questions or concerns. No barriers to learning were detected.  We ask that BRANDYN MCCARTHER return to clinic in 1 week with updated MRI and labs, or sooner as needed.  The total time spent in the encounter was 40 minutes and more than 50% was on counseling and review of test results   Henreitta Leber, MD Medical Director of Neuro-Oncology [facility] 10/01/23 11:47 AM

## 2023-10-02 ENCOUNTER — Telehealth: Payer: Self-pay | Admitting: *Deleted

## 2023-10-02 ENCOUNTER — Telehealth: Payer: Self-pay | Admitting: Internal Medicine

## 2023-10-02 NOTE — Telephone Encounter (Signed)
Received call from [facility]. Referral had been sent for pt earlier in the week for RN and PT. [facility] called to day to let us know that pt's wife declined Home Health because she wants personal care services-someone to stay with him during the day while she is at work.

## 2023-10-02 NOTE — Telephone Encounter (Signed)
Alex Church

## 2023-10-05 ENCOUNTER — Other Ambulatory Visit (HOSPITAL_COMMUNITY): Payer: Self-pay

## 2023-10-05 ENCOUNTER — Telehealth: Payer: Self-pay | Admitting: Internal Medicine

## 2023-10-05 ENCOUNTER — Other Ambulatory Visit: Payer: Self-pay

## 2023-10-05 NOTE — Telephone Encounter (Signed)
Left Message - Informed [name] that we need to keep appointments scheduled as is per a Voicemail received from [place]. Informed [name] to contact us if questions arise.

## 2023-10-06 NOTE — Progress Notes (Unsigned)
Cardiology Office Note:  .   Date:  10/07/2023  ID:  [name], DOB 05-25-1946, MRN [id number] PCP: Wanda Plump, MD  [facility] Providers Cardiologist:  None    History of Present Illness: .   ALLAH REASON is a 78 y.o. male with history of Glioblastoma s/p craniotomy and resection in 05/2023. Dr. Ladona Ridgel saw him for troponin of 1000 and mild to mod LV dysfunction witch was new. He was asymptomatic and sent home on coreg and lisinopril. 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 7 day zio- 5 beat NSVT, 8 beat SVT   Patient added onto my schedule for evaluation of recurrent falls. Review of oncology note from 10/01/23 "modest worsening of motor function, left leg weakness.  Etiology is either tumor progression or post-RT inflammation, or mixed.  Temodar was not very well tolerated cycle 1, and held off on cycle 2. For MRI today.   Patient has been unstable since his glioblastoma surgery. Has been shuffling more and falling. He denies dizziness. Says his left leg is weak and gives out. His wife wanted to make sure this wasn't coming from heart failure. Mild ankle edema on increased decadron. No dyspnea, dizziness, chest pain or palpitations.    ROS:    Studies Reviewed: Marland Kitchen         Prior CV Studies:   Zio 07/2023 NSR (ave 78/min) with sinus tachycardia (126/min) NSVT, 5 beats at 174/min NS SVT, 8 beats at 126/mn No atrial fib or flutter No sustained VT or SVT No prolonged pauses No associated symptoms. Less than 1% PAC's and PVC's Patch Wear Time:  7 days and 0 hours (2024-11-07T20:00:27-0500 to 2024-11-14T20:34:45-0500)  Echo 07/2023 IMPRESSIONS     1. Apical hypokinesis with overall mild LV dysfunction.   2. Left ventricular ejection fraction, by estimation, is 45 to 50%. The  left ventricle has mildly decreased function. The left ventricle  demonstrates regional wall motion abnormalities (see scoring  diagram/findings for description). Left ventricular  diastolic  parameters were normal.   3. Right ventricular systolic function is normal. The right ventricular  size is normal. There is mildly elevated pulmonary artery systolic  pressure.   4. Left atrial size was mildly dilated.   5. The mitral valve is normal in structure. Trivial mitral valve  regurgitation. No evidence of mitral stenosis.   6. The aortic valve has an indeterminant number of cusps. Aortic valve  regurgitation is not visualized. No aortic stenosis is present.   7. The inferior vena cava is dilated in size with >50% respiratory  variability, suggesting right atrial pressure of 8 mmHg.   Comparison(s): No prior Echocardiogram.    Risk Assessment/Calculations:             Physical Exam:   VS:  BP 134/86   Pulse 72   Ht 6' (1.829 m)   Wt 182 lb 6.4 oz (82.7 kg)   SpO2 96%   BMI 24.74 kg/m    Wt Readings from Last 3 Encounters:  10/07/23 182 lb 6.4 oz (82.7 kg)  10/01/23 186 lb 6.4 oz (84.6 kg)  08/24/23 190 lb 11.2 oz (86.5 kg)    GEN: Well nourished, well developed in no acute distress NECK: No JVD; No carotid bruits CARDIAC:  RRR, no murmurs, rubs, gallops RESPIRATORY:  Clear to auscultation without rales, wheezing or rhonchi  ABDOMEN: Soft, non-tender, non-distended EXTREMITIES:  trace ankle edema; No deformity   ASSESSMENT AND PLAN: .    Falls felt  to tumor progression or post-RT inflamation or mixed. Followed by oncology-no dizziness and not orthostatic in the office today. Not likely related to cardiac. For MRI today and f/u with oncology tomorrow.  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 No CHF on exam and not orthostatic. BP 120/80 sitting, 120/80 standing, 140/80 standing after several minutes.        Dispo: f/u as scheduled  Signed, Jacolyn Reedy, PA-C

## 2023-10-07 ENCOUNTER — Telehealth: Payer: Self-pay

## 2023-10-07 ENCOUNTER — Other Ambulatory Visit (HOSPITAL_COMMUNITY): Payer: Self-pay

## 2023-10-07 ENCOUNTER — Encounter: Payer: Self-pay | Admitting: Internal Medicine

## 2023-10-07 ENCOUNTER — Ambulatory Visit: Payer: Medicare Other | Attending: Physician Assistant | Admitting: Physician Assistant

## 2023-10-07 ENCOUNTER — Ambulatory Visit (HOSPITAL_COMMUNITY)
Admission: RE | Admit: 2023-10-07 | Discharge: 2023-10-07 | Disposition: A | Discharge status: Home / Self Care | Payer: Medicare Other | Source: Ambulatory Visit | Attending: Internal Medicine | Admitting: Internal Medicine

## 2023-10-07 ENCOUNTER — Encounter: Payer: Self-pay | Admitting: Physician Assistant

## 2023-10-07 VITALS — BP 134/86 | HR 72 | Ht 72.0 in | Wt 182.4 lb

## 2023-10-07 DIAGNOSIS — R296 Repeated falls: Secondary | ICD-10-CM

## 2023-10-07 DIAGNOSIS — I519 Heart disease, unspecified: Secondary | ICD-10-CM | POA: Diagnosis not present

## 2023-10-07 DIAGNOSIS — C719 Malignant neoplasm of brain, unspecified: Secondary | ICD-10-CM | POA: Diagnosis not present

## 2023-10-07 MED ORDER — GADOBUTROL 1 MMOL/ML IV SOLN
8.0000 mL | Freq: Once | INTRAVENOUS | Status: AC | PRN
  Administered 2023-10-07: 8 mL via INTRAVENOUS

## 2023-10-07 NOTE — Telephone Encounter (Signed)
Opened new encounter

## 2023-10-07 NOTE — Telephone Encounter (Signed)
Copied from CRM [id number]. Topic: Clinical - Medication Question >> Oct 07, 2023 10:13 AM Alex Church wrote: Reason for CRM: Wife Alex Church called and stated that his medication  Decadron is causing him to cough up flem and would like to see if something else can be prescribed.

## 2023-10-07 NOTE — Telephone Encounter (Signed)
Alex Church, Alex Church  CR   10/07/23 11:46 AM Unsigned Note Copied from CRM [id number]. Topic: Clinical - Medication Question >> Oct 07, 2023 10:13 AM Samuel Jester B wrote: Reason for CRM: Wife Alex Church called and stated that his medication  Decadron is causing him to cough up flem and would like to see if something else can be prescribed.

## 2023-10-07 NOTE — Patient Instructions (Signed)
Medication Instructions:  Your physician recommends that you continue on your current medications as directed. Please refer to the Current Medication list given to you today.  *If you need a refill on your cardiac medications before your next appointment, please call your pharmacy*   Lab Work: None ordered  If you have labs (blood work) drawn today and your tests are completely normal, you will receive your results only by: MyChart Message (if you have MyChart) OR A paper copy in the mail If you have any lab test that is abnormal or we need to change your treatment, we will call you to review the results.   Testing/Procedures: None ordered   Follow-Up: At [facility], you and your health needs are our priority.  As part of our continuing mission to provide you with exceptional heart care, we have created designated Provider Care Teams.  These Care Teams include your primary Cardiologist (physician) and Advanced Practice Providers (APPs -  Physician Assistants and Nurse Practitioners) who all work together to provide you with the care you need, when you need it.  We recommend signing up for the patient portal called "MyChart".  Sign up information is provided on this After Visit Summary.  MyChart is used to connect with patients for Virtual Visits (Telemedicine).  Patients are able to view lab/test results, encounter notes, upcoming appointments, etc.  Non-urgent messages can be sent to your provider as well.   To learn more about what you can do with MyChart, go to [email or web address].    Your next appointment:   As scheduled   Provider:   Lewayne Bunting, MD    Other Instructions   1st Floor: - Lobby - Registration  - Pharmacy  - Lab - Cafe  2nd Floor: - PV Lab - Diagnostic Testing (echo, CT, nuclear med)  3rd Floor: - Vacant  4th Floor: - TCTS (cardiothoracic surgery) - AFib Clinic - Structural Heart Clinic - Vascular Surgery  - Vascular  Ultrasound  5th Floor: - HeartCare Cardiology (general and EP) - Clinical Pharmacy for coumadin, hypertension, lipid, weight-loss medications, and med management appointments    Valet parking services will be available as well.

## 2023-10-07 NOTE — Telephone Encounter (Signed)
Not prescribed by Dr. Drue Novel- will need to reach out to Dr. Liana Gerold office.

## 2023-10-08 ENCOUNTER — Telehealth: Payer: Self-pay | Admitting: Internal Medicine

## 2023-10-08 ENCOUNTER — Other Ambulatory Visit: Payer: Self-pay

## 2023-10-08 ENCOUNTER — Other Ambulatory Visit (HOSPITAL_COMMUNITY): Payer: Self-pay

## 2023-10-08 ENCOUNTER — Inpatient Hospital Stay: Payer: Medicare Other

## 2023-10-08 ENCOUNTER — Encounter: Payer: Self-pay | Admitting: Internal Medicine

## 2023-10-08 ENCOUNTER — Inpatient Hospital Stay: Payer: Medicare Other | Admitting: Internal Medicine

## 2023-10-08 VITALS — BP 160/77 | HR 70 | Temp 98.1°F | Resp 18 | Ht 72.0 in | Wt 184.5 lb

## 2023-10-08 DIAGNOSIS — Z79899 Other long term (current) drug therapy: Secondary | ICD-10-CM | POA: Diagnosis not present

## 2023-10-08 DIAGNOSIS — C719 Malignant neoplasm of brain, unspecified: Secondary | ICD-10-CM

## 2023-10-08 DIAGNOSIS — R569 Unspecified convulsions: Secondary | ICD-10-CM | POA: Diagnosis not present

## 2023-10-08 DIAGNOSIS — C711 Malignant neoplasm of frontal lobe: Secondary | ICD-10-CM | POA: Diagnosis not present

## 2023-10-08 LAB — CMP (CANCER CENTER ONLY)
ALT: 17 U/L (ref 0–44)
AST: 12 U/L — ABNORMAL LOW (ref 15–41)
Albumin: 3.7 g/dL (ref 3.5–5.0)
Alkaline Phosphatase: 73 U/L (ref 38–126)
Anion gap: 9 (ref 5–15)
BUN: 29 mg/dL — ABNORMAL HIGH (ref 8–23)
CO2: 30 mmol/L (ref 22–32)
Calcium: 9.3 mg/dL (ref 8.9–10.3)
Chloride: 95 mmol/L — ABNORMAL LOW (ref 98–111)
Creatinine: 1.09 mg/dL (ref 0.61–1.24)
GFR, Estimated: >60 mL/min (ref >60)
Glucose, Bld: 162 mg/dL — ABNORMAL HIGH (ref 70–99)
Potassium: 4.1 mmol/L (ref 3.5–5.1)
Sodium: 134 mmol/L — ABNORMAL LOW (ref 135–145)
Total Bilirubin: 0.5 mg/dL (ref 0.0–1.2)
Total Protein: 6.3 g/dL — ABNORMAL LOW (ref 6.5–8.1)

## 2023-10-08 LAB — CBC WITH DIFFERENTIAL (CANCER CENTER ONLY)
Abs Immature Granulocytes: 0.18 10*3/uL — ABNORMAL HIGH (ref 0.00–0.07)
Basophils Absolute: 0 10*3/uL (ref 0.0–0.1)
Basophils Relative: 0 %
Eosinophils Absolute: 0 10*3/uL (ref 0.0–0.5)
Eosinophils Relative: 0 %
HCT: 41.5 % (ref 39.0–52.0)
Hemoglobin: 14 g/dL (ref 13.0–17.0)
Immature Granulocytes: 2 %
Lymphocytes Relative: 4 %
Lymphs Abs: 0.4 10*3/uL — ABNORMAL LOW (ref 0.7–4.0)
MCH: 30.3 pg (ref 26.0–34.0)
MCHC: 33.7 g/dL (ref 30.0–36.0)
MCV: 89.8 fL (ref 80.0–100.0)
Monocytes Absolute: 0.6 10*3/uL (ref 0.1–1.0)
Monocytes Relative: 6 %
Neutro Abs: 8.7 10*3/uL — ABNORMAL HIGH (ref 1.7–7.7)
Neutrophils Relative %: 88 %
Platelet Count: 190 10*3/uL (ref 150–400)
RBC: 4.62 MIL/uL (ref 4.22–5.81)
RDW: 14 % (ref 11.5–15.5)
WBC Count: 10 10*3/uL (ref 4.0–10.5)
nRBC: 0 % (ref 0.0–0.2)

## 2023-10-08 MED ORDER — DEXAMETHASONE 1 MG PO TABS
ORAL_TABLET | ORAL | 0 refills | Status: DC
  Filled 2023-10-08: qty 42, 21d supply, fill #0

## 2023-10-08 MED ORDER — PANTOPRAZOLE SODIUM 40 MG PO TBEC
40.0000 mg | DELAYED_RELEASE_TABLET | Freq: Every day | ORAL | 1 refills | Status: DC
  Filled 2023-10-08: qty 30, 30d supply, fill #0
  Filled 2023-11-05: qty 30, 30d supply, fill #1

## 2023-10-08 NOTE — Telephone Encounter (Signed)
Scheduled patient for appointment. Informed Alex Church of appointment time. [name] was acceptable to appointment.

## 2023-10-08 NOTE — Progress Notes (Signed)
[facility] [address]  [place], [place] [address] [phone]   Interval Evaluation  Date of Service: 10/08/23 Patient Name: Alex Church Patient MRN: [id number] Patient DOB: 03-29-1946 Provider: Henreitta Leber, MD  Identifying Statement:  Alex Church is a 78 y.o. male with right frontal glioblastoma   Oncologic History: Oncology History  Glioblastoma, IDH-wildtype (HCC)  05/12/2023 Surgery   Right frontal craniotomy, resection with Dr. Jake Samples; path is glioblastoma IDH-wt   06/09/2023 -  Chemotherapy   Patient is on Treatment Plan : BRAIN GLIOBLASTOMA Radiation Therapy With Concurrent Temozolomide 75 mg/m2 Daily Followed By Sequential Maintenance Temozolomide x 6-12 cycles          Interval History: Alex Church presents today for follow up after MRI brain, still s/p 1 cycle of TMZ.  He describes no improvement left leg weakness since increasing the decadron.  He is shuffling around the home, now requiring use of walker, new from prior.  No complaint with the left arm.  No further seizures, still doing well with Keppra.  Dosing decadron 4mg  twice per day.  Noted reflux and insomnia symptoms since the dose was increased.  H+P (06/02/23) Patient presented to neurologic attention in late August with several days history of left sided weakness, confusion.  Wife noticed he had difficulty getting out of bed, was dragging left side.  This was on top of several months history of progressive confusion, short term memory impairment.  CNS imaging demonstrated a large right frontal mass.  He underwent craniotomy and resection with Dr. Jake Samples, path demonstrated glioblastoma.  Since surgery he has been doing well, walking independently.  Denies seizures, headaches.  He is off decadron.   Medications: Current Outpatient Medications on File Prior to Visit  Medication Sig Dispense Refill   acetaminophen (TYLENOL) 500 MG tablet Take 1,000 mg by mouth  every 6 (six) hours as needed for mild pain (pain score 1-3) (Tries to only take twice a week).     allopurinol (ZYLOPRIM) 100 MG tablet Take 1 tablet (100 mg total) by mouth 2 (two) times daily. 180 tablet 1   atorvastatin (LIPITOR) 10 MG tablet Take 1 tablet (10 mg total) by mouth at bedtime. 90 tablet 1   Blood Glucose Monitoring Suppl (ONETOUCH VERIO FLEX SYSTEM) w/Device KIT Check blood sugar once daily 1 kit 0   carvedilol (COREG) 12.5 MG tablet Take 0.5 tablets (6.25 mg total) by mouth 2 (two) times daily with a meal. 90 tablet 1   cloNIDine (CATAPRES) 0.1 MG tablet Take 1 tablet (0.1 mg total) by mouth 2 (two) times daily. 180 tablet 1   Colchicine 0.6 MG CAPS Take 1 capsule (0.6 mg total) by mouth 2 (two) times daily as needed. 180 capsule 0   Cyanocobalamin (VITAMIN B-12) 1000 MCG/15ML LIQD Take 1,000 mcg by mouth daily.     dexamethasone (DECADRON) 4 MG tablet Take 1 tablet (4 mg total) by mouth daily. 60 tablet 0   diphenhydramine-acetaminophen (TYLENOL PM) 25-500 MG TABS tablet Take 1 tablet by mouth at bedtime as needed.     glucose blood test strip Check blood sugars once daily 100 each 12   Lancets (ONETOUCH ULTRASOFT) lancets Check bloods sugars once daily 100 each 12   levETIRAcetam (KEPPRA) 500 MG tablet Take 1 tablet (500 mg total) by mouth 2 (two) times daily. 60 tablet 3   lisinopril (ZESTRIL) 20 MG tablet Take 20 mg by mouth at bedtime.  melatonin 5 MG TABS Take 1 tablet (5 mg total) by mouth at bedtime. 30 tablet 0   metFORMIN (GLUCOPHAGE) 1000 MG tablet Take 1 tablet (1,000 mg total) by mouth 2 (two) times daily with a meal. 180 tablet 1   ondansetron (ZOFRAN) 8 MG tablet Take 1 tablet (8 mg total) by mouth every 8 (eight) hours as needed for nausea or vomiting. May take 30-60 minutes prior to Temodar administration if nausea/vomiting occurs as needed. 30 tablet 1   pioglitazone (ACTOS) 45 MG tablet Take 1 tablet (45 mg total) by mouth daily. 90 tablet 1   polyethylene  glycol (MIRALAX / GLYCOLAX) 17 g packet Take 17 g by mouth daily as needed for mild constipation. 14 each 0   sitaGLIPtin (JANUVIA) 100 MG tablet Take 1 tablet (100 mg total) by mouth daily. 90 tablet 1   sodium chloride 1 g tablet Take 1 tablet (1 g total) by mouth 3 (three) times daily with meals. 90 tablet 0   tamsulosin (FLOMAX) 0.4 MG CAPS capsule Take 1 capsule (0.4 mg total) by mouth daily after supper. 90 capsule 1   temozolomide (TEMODAR) 100 MG capsule Take 3 capsules (300 mg total) by mouth at bedtime. Take for 5 days on, then off for 23 days. Repeat every 28 days. Take on an empty stomach to decrease nausea & vomiting. 15 capsule 0   No current facility-administered medications on file prior to visit.    Allergies: No Known Allergies Past Medical History:  Past Medical History:  Diagnosis Date   B12 deficiency    monthly shots   Diabetes mellitus    Glaucoma suspect    Glioblastoma (HCC)    Hyperlipemia    Hypertension    Past Surgical History:  Past Surgical History:  Procedure Laterality Date   APPLICATION OF CRANIAL NAVIGATION Right 05/12/2023   Procedure: APPLICATION OF CRANIAL NAVIGATION;  Surgeon: Dawley, Alan Mulder, DO;  Location: [facility] OR;  Service: Neurosurgery;  Laterality: Right;   CRANIOTOMY Right 05/12/2023   Procedure: RIGHT STERIOSTACTIC FRONTAL CRANIOTOMY FOR TUMOR RESECTION;  Surgeon: Bethann Goo, DO;  Location: [facility] OR;  Service: Neurosurgery;  Laterality: Right;   Social History:  Social History   Socioeconomic History   Marital status: Married    Spouse name: Not on file   Number of children: 0   Years of education: Not on file   Highest education level: GED or equivalent  Occupational History   Occupation: retired from [employer], works for a lab driving  Tobacco Use   Smoking status: Never   Smokeless tobacco: Never  Substance and Sexual Activity   Alcohol use: No   Drug use: No   Sexual activity: Not on file  Other Topics Concern   Not on file   Social History Narrative   Married (2nd marriage)   Lives w/ wife     Has a rescue dog   Social Drivers of Health   Financial Resource Strain: Low Risk  (07/13/2023)   Overall Financial Resource Strain (CARDIA)    Difficulty of Paying Living Expenses: Not hard at all  Food Insecurity: No Food Insecurity (07/13/2023)   Hunger Vital Sign    Worried About Running Out of Food in the Last Year: Never true    Ran Out of Food in the Last Year: Never true  Transportation Needs: No Transportation Needs (07/13/2023)   PRAPARE - Administrator, Civil Service (Medical): No    Lack of Transportation (Non-Medical): No  Physical Activity: Insufficiently Active (07/13/2023)   Exercise Vital Sign    Days of Exercise per Week: 2 days    Minutes of Exercise per Session: 20 min  Stress: Stress Concern Present (07/13/2023)   [employer] of Occupational Health - Occupational Stress Questionnaire    Feeling of Stress : To some extent  Social Connections: Moderately Isolated (07/13/2023)   Social Connection and Isolation Panel [NHANES]    Frequency of Communication with Friends and Family: Twice a week    Frequency of Social Gatherings with Friends and Family: More than three times a week    Attends Religious Services: Never    Database administrator or Organizations: No    Attends Engineer, structural: Not on file    Marital Status: Married  Catering manager Violence: Not At Risk (06/02/2023)   Humiliation, Afraid, Rape, and Kick questionnaire    Fear of Current or Ex-Partner: No    Emotionally Abused: No    Physically Abused: No    Sexually Abused: No   Family History:  Family History  Problem Relation Age of Onset   Diabetes Maternal Uncle    Heart attack Brother 62   Cancer Mother        intra-abdominal   Cirrhosis Cousin        maternal   Diabetes Cousin        maternal   Stroke Neg Hx    Colon cancer Neg Hx    Prostate cancer Neg Hx     Review of  Systems: Constitutional: Doesn't report fevers, chills or abnormal weight loss Eyes: Doesn't report blurriness of vision Ears, nose, mouth, throat, and face: Doesn't report sore throat Respiratory: Doesn't report cough, dyspnea or wheezes Cardiovascular: Doesn't report palpitation, chest discomfort  Gastrointestinal:  Doesn't report nausea, constipation, diarrhea GU: Doesn't report incontinence Skin: Doesn't report skin rashes Neurological: Per HPI Musculoskeletal: Doesn't report joint pain Behavioral/Psych: Doesn't report anxiety  Physical Exam: Vitals:   10/08/23 0838  BP: (!) 160/77  Pulse: 70  Resp: 18  Temp: 98.1 F (36.7 C)  SpO2: 99%    KPS: 70. General: Alert, cooperative, pleasant, in no acute distress Head: Normal EENT: No conjunctival injection or scleral icterus.  Lungs: Resp effort normal Cardiac: Regular rate Abdomen: Non-distended abdomen Skin: No rashes cyanosis or petechiae. Extremities: No clubbing or edema  Neurologic Exam: Mental Status: Awake, alert, attentive to examiner. Oriented to self and environment. Language is fluent with intact comprehension.  Cranial Nerves: Visual acuity is grossly normal. Visual fields are full. Extra-ocular movements intact. No ptosis. Face is symmetric Motor: Tone and bulk are normal. Power is 4+/5 in left leg. Reflexes are symmetric, no pathologic reflexes present.  Sensory: Intact to light touch Gait: Dystaxic   Labs: I have reviewed the data as listed    Component Value Date/Time   NA 136 10/01/2023 1046   NA 139 04/06/2022 1047   K 4.1 10/01/2023 1046   CL 97 (L) 10/01/2023 1046   CO2 31 10/01/2023 1046   GLUCOSE 185 (H) 10/01/2023 1046   GLUCOSE 147 (H) 08/26/2006 1051   BUN 23 10/01/2023 1046   BUN 17 04/06/2022 1047   CREATININE 1.06 10/01/2023 1046   CREATININE 1.09 06/20/2020 1509   CALCIUM 9.9 10/01/2023 1046   PROT 6.4 (L) 10/01/2023 1046   ALBUMIN 3.7 10/01/2023 1046   AST 10 (L) 10/01/2023  1046   ALT 19 10/01/2023 1046   ALKPHOS 69 10/01/2023 1046   BILITOT 0.4  10/01/2023 1046   GFRNONAA >60 10/01/2023 1046   GFRAA 126 08/26/2007 0943   Lab Results  Component Value Date   WBC 10.0 10/08/2023   NEUTROABS 8.7 (H) 10/08/2023   HGB 14.0 10/08/2023   HCT 41.5 10/08/2023   MCV 89.8 10/08/2023   PLT 190 10/08/2023    Imaging:  [facility] Clinician Interpretation: I have personally reviewed the CNS images as listed.  My interpretation, in the context of the patient's clinical presentation, is likely treatment effect  MR BRAIN W WO CONTRAST Result Date: 10/07/2023 CLINICAL DATA:  Glioblastoma, assess treatment response EXAM: MRI HEAD WITHOUT AND WITH CONTRAST TECHNIQUE: Multiplanar, multiecho pulse sequences of the brain and surrounding structures were obtained without and with intravenous contrast. CONTRAST:  8mL GADAVIST GADOBUTROL 1 MMOL/ML IV SOLN COMPARISON:  08/20/2023 FINDINGS: Brain: Status post right frontal craniotomy with subjacent resection cavity in the right frontal lobe. Thin, somewhat irregular enhancement about the resection cavity appears unchanged from the prior exam slightly increased associated T2 hyperintense signal, which extends more posteriorly into the right frontal periventricular white matter (series 9, image 32 and 36), more anteriorly into the right frontal lobe (series 9, image 32), and has increased in thickness along the left frontal horn (series 9, image 32). No new areas of abnormal enhancement. Redemonstrated extra-axial collection, subjacent to the cranioplasty, slightly decreased in size, now measuring to 7 mm, previous 11 mm. An additional extra-axial collection anterior to the right frontal lobe measures up to 8 mm, unchanged. Redemonstrated dural thickening overlying the right greater than left cerebral hemispheres, unchanged and likely postoperative. No evidence of acute infarct, hemorrhage, midline shift, or hydrocephalus. Pituitary and craniocervical  junction within normal limits. Vascular: Normal arterial flow voids. Normal arterial and venous enhancement. Skull and upper cervical spine: Right frontal craniotomy. Otherwise normal marrow signal. Sinuses/Orbits: Clear paranasal sinuses. No acute finding in the orbits. Other: Trace fluid in the mastoid air cells. IMPRESSION: 1. Postoperative changes in the right frontal lobe, with unchanged thin irregular enhancement about the resection but slightly increased surrounding T2 hyperintense signal, which could represent post treatment change but is concerning for progressive, nonenhancing, infiltrative tumor. 2. Redemonstrated extra-axial collection subjacent to the cranioplasty, slightly decreased in size, now measuring to 7 mm, previous 11 mm. An additional extra-axial collection anterior to the right frontal lobe measures up to 8 mm, unchanged. Electronically Signed   By: Wiliam Ke M.D.   On: 10/07/2023 17:15    Assessment/Plan Glioblastoma, IDH-wildtype (HCC)  Seizure (HCC)  Alex Church is clinically stable today, with stable decline of motor function, and cognition noted from prior visit.  MRI brain demonstrates symmetric increase in T2/FLAIR signal abnormality, most c/w post-radiation leukomalacia.  Reflux from decadron is fairly significant today; recommended starting protonix 40mg  daily.  Recommended decreasing decadron by 1mg  each week, starting with 3mg  daily tomorrow.  Dose may be modified if focal symptoms recur.    We recommended holding off on cycle #2 of Temodar given GI issues and insomnia, pending phone visit next week.  Keppra should continue at 500mg  BID.  All questions were answered. The patient knows to call the clinic with any problems, questions or concerns. No barriers to learning were detected.  We ask that Alex Church return to clinic in 1 week, or sooner as needed.  The total time spent in the encounter was 40 minutes and more than 50% was on counseling and  review of test results   Henreitta Leber, MD Medical Director of  Neuro-Oncology [facility] 10/08/23 8:59 AM

## 2023-10-13 ENCOUNTER — Other Ambulatory Visit (HOSPITAL_COMMUNITY): Payer: Self-pay

## 2023-10-13 ENCOUNTER — Encounter: Payer: Self-pay | Admitting: Internal Medicine

## 2023-10-13 ENCOUNTER — Inpatient Hospital Stay: Payer: Medicare Other | Attending: Internal Medicine | Admitting: Internal Medicine

## 2023-10-13 DIAGNOSIS — C719 Malignant neoplasm of brain, unspecified: Secondary | ICD-10-CM | POA: Diagnosis not present

## 2023-10-13 DIAGNOSIS — R569 Unspecified convulsions: Secondary | ICD-10-CM

## 2023-10-13 MED ORDER — QUETIAPINE FUMARATE 25 MG PO TABS
25.0000 mg | ORAL_TABLET | Freq: Every day | ORAL | 1 refills | Status: DC
  Filled 2023-10-13: qty 30, 30d supply, fill #0

## 2023-10-13 NOTE — Progress Notes (Signed)
 I connected with Alex Church on 10/13/23 at 12:45 PM EST by telephone visit and verified that I am speaking with the correct person using two identifiers.  I discussed the limitations, risks, security and privacy concerns of performing an evaluation and management service by telemedicine and the availability of in-person appointments. I also discussed with the patient that there may be a patient responsible charge related to this service. The patient expressed understanding and agreed to proceed.   Other persons participating in the visit and their role in the encounter:  spouse  Patient's location:  Home Provider's location:  Office Chief Complaint:  Glioblastoma, IDH-wildtype (HCC)  Seizure (HCC)  History of Present Ilness: Alex Church reports only modest improvement in reflux symptoms since protonix  was started.  He has continued on the decadron , now down to 3mg  daily.  Per his wife, he remains very difficult to control at night, with frequent awakenings and inappropriate behavior that is not present during the daytime.  No further seizures, headaches.  Observations: Language and cognition at baseline  Assessment and Plan: Glioblastoma, IDH-wildtype (HCC)  Seizure (HCC)  Recommended decreasing decadron  to 2mg  daily x5 days, then 1mg  daily x5 days, then stopping.   Protonix  will keep at 40mg  daily for now.   Agreeable with trial of Seroquel  25mg  HS for sleep and nocturnal behavioral issues.    Will defer next cycle of Temodar  one additional week pending response to the above interventions.  Follow Up Instructions: RTC 1 week  I discussed the assessment and treatment plan with the patient.  The patient was provided an opportunity to ask questions and all were answered.  The patient agreed with the plan and demonstrated understanding of the instructions.    The patient was advised to call back or seek an in-person evaluation if the symptoms worsen or if the condition fails to  improve as anticipated.    Odelia Graciano K Tiffnay Bossi, MD   I provided 20 minutes of non face-to-face telephone visit time during this encounter, and > 50% was spent counseling as documented under my assessment & plan.

## 2023-10-14 ENCOUNTER — Other Ambulatory Visit (HOSPITAL_COMMUNITY): Payer: Self-pay

## 2023-10-14 ENCOUNTER — Ambulatory Visit: Payer: Medicare Other | Admitting: Family Medicine

## 2023-10-14 ENCOUNTER — Ambulatory Visit: Payer: Self-pay | Admitting: Internal Medicine

## 2023-10-14 ENCOUNTER — Encounter: Payer: Self-pay | Admitting: Internal Medicine

## 2023-10-14 MED ORDER — BACLOFEN 10 MG PO TABS
5.0000 mg | ORAL_TABLET | Freq: Three times a day (TID) | ORAL | 1 refills | Status: DC
Start: 2023-10-14 — End: 2023-11-05
  Filled 2023-10-14: qty 45, 30d supply, fill #0

## 2023-10-14 NOTE — Telephone Encounter (Signed)
 Copied from CRM [id number]. Topic: Clinical - Red Word Triage >> Oct 14, 2023  9:57 AM Drema MATSU wrote: Red Word that prompted transfer to Nurse Triage: Patient is having severe pain when eating and drinking by his lower abdomen and chest area. Patiet is also spitting up flim and does not feel good.   Chief Complaint: Abdominal pain Symptoms: Some chest pain Frequency: Intermittent  Disposition: [] ED /[] Urgent Care (no appt availability in office) / [x] Appointment(In office/virtual)/ []  [facility]/ [] Home Care/ [] Refused Recommended Disposition /[] [facility]/ []  Follow-up with PCP Additional Notes: Patient's wife called with patient for abdominal pain. Patient undergoing cancer treatment and has been having abdominal pain when swallowing. The pain is in the upper abdomen/chest and lower abdomen, and is present when swallowing and then resolves. Patient has also been experiencing some spitting up of phlegm but no food emesis. Patient's PCP and oncologist are aware of this problem and patient is taking Protonix  and Dexamethasone  without improvement of symptoms. Patient's wife requesting an appointment today in an available office to discuss other options for his pain.     Reason for Disposition . Age > 60 years  Answer Assessment - Initial Assessment Questions 1. LOCATION: Where does it hurt?      Lower abdomen  2. RADIATION: Does the pain shoot anywhere else? (e.g., chest, back)     Chest 3. ONSET: When did the pain begin? (Minutes, hours or days ago)      Ongoing problem  4. SUDDEN: Gradual or sudden onset?     Sudden with eating  5. PATTERN Does the pain come and go, or is it constant?    - If it comes and goes: How long does it last? Do you have pain now?     (Note: Comes and goes means the pain is intermittent. It goes away completely between bouts.)    - If constant: Is it getting better, staying the same, or getting worse?      (Note: Constant  means the pain never goes away completely; most serious pain is constant and gets worse.)      Happens when swallowing 6. SEVERITY: How bad is the pain?  (e.g., Scale 1-10; mild, moderate, or severe)    - MILD (1-3): Doesn't interfere with normal activities, abdomen soft and not tender to touch.     - MODERATE (4-7): Interferes with normal activities or awakens from sleep, abdomen tender to touch.     - SEVERE (8-10): Excruciating pain, doubled over, unable to do any normal activities.       8/10 when swallowing  7. RECURRENT SYMPTOM: Have you ever had this type of stomach pain before? If Yes, ask: When was the last time? and What happened that time?      Yes 8. CAUSE: What do you think is causing the stomach pain?     Been ongoing with cancer treatment  9. RELIEVING/AGGRAVATING FACTORS: What makes it better or worse? (e.g., antacids, bending or twisting motion, bowel movement)     Worse with eating, not present otherwise  10. OTHER SYMPTOMS: Do you have any other symptoms? (e.g., back pain, diarrhea, fever, urination pain, vomiting)       Spitting up phlegm but no food emesis  Protocols used: Abdominal Pain - Male-A-AH

## 2023-10-16 ENCOUNTER — Encounter: Payer: Self-pay | Admitting: Internal Medicine

## 2023-10-17 ENCOUNTER — Telehealth: Payer: Medicare Other | Admitting: Family Medicine

## 2023-10-17 ENCOUNTER — Ambulatory Visit: Payer: Medicare Other

## 2023-10-17 DIAGNOSIS — R21 Rash and other nonspecific skin eruption: Secondary | ICD-10-CM | POA: Diagnosis not present

## 2023-10-17 MED ORDER — CLOTRIMAZOLE-BETAMETHASONE 1-0.05 % EX CREA: 1.0000 | TOPICAL_CREAM | Freq: Every day | CUTANEOUS | 0 refills | Status: DC

## 2023-10-17 MED ORDER — FLUCONAZOLE 100 MG PO TABS: 100.0000 mg | ORAL_TABLET | ORAL | 0 refills | Status: DC

## 2023-10-17 MED ORDER — FLUCONAZOLE 100 MG PO TABS: 100.0000 mg | ORAL_TABLET | ORAL | 0 refills | Status: AC

## 2023-10-17 NOTE — Progress Notes (Signed)
 Virtual Visit Consent   Alex Church, you are scheduled for a virtual visit with a [facility] provider today. Just as with appointments in the office, your consent must be obtained to participate. Your consent will be active for this visit and any virtual visit you may have with one of our providers in the next 365 days. If you have a MyChart account, a copy of this consent can be sent to you electronically.  As this is a virtual visit, video technology does not allow for your provider to perform a traditional examination. This may limit your provider's ability to fully assess your condition. If your provider identifies any concerns that need to be evaluated in person or the need to arrange testing (such as labs, EKG, etc.), we will make arrangements to do so. Although advances in technology are sophisticated, we cannot ensure that it will always work on either your end or our end. If the connection with a video visit is poor, the visit may have to be switched to a telephone visit. With either a video or telephone visit, we are not always able to ensure that we have a secure connection.  By engaging in this virtual visit, you consent to the provision of healthcare and authorize for your insurance to be billed (if applicable) for the services provided during this visit. Depending on your insurance coverage, you may receive a charge related to this service.  I need to obtain your verbal consent now. Are you willing to proceed with your visit today? ADDEN STROUT has provided verbal consent on 10/17/2023 for a virtual visit (video or telephone). Loa Lamp, FNP  Date: 10/17/2023 11:21 AM  Virtual Visit via Video Note   I, Loa Lamp, connected with  Alex Church  ([id number], Jan 13, 1946) on 10/17/23 at 11:15 AM EST by a video-enabled telemedicine application and verified that I am speaking with the correct person using two identifiers.  Location: Patient: Virtual Visit Location Patient:  Home Provider: Virtual Visit Location Provider: Home Office   I discussed the limitations of evaluation and management by telemedicine and the availability of in person appointments. The patient expressed understanding and agreed to proceed.    History of Present Illness: Alex Church is a 78 y.o. who identifies as a male who was assigned male at birth, and is being seen today for a rash in skin folds of buttocks for several weeks. 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 He has become increasingly weak per his wife.Area if red, painful bleeding and excoriated.   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   BPH (benign prostatic hyperplasia) 08/26/2007   Hyperlipidemia 10/02/2006   Essential hypertension 10/02/2006    Allergies: No Known Allergies Medications:  Current Outpatient Medications:    acetaminophen  (TYLENOL ) 500 MG tablet, Take 1,000 mg by mouth every 6 (six) hours as needed for mild pain (pain score 1-3) (Tries to only  take twice a week)., Disp: , Rfl:    allopurinol  (ZYLOPRIM ) 100 MG tablet, Take 1 tablet (100 mg total) by mouth 2 (two) times daily., Disp: 180 tablet, Rfl: 1   atorvastatin  (LIPITOR) 10 MG tablet, Take 1 tablet (10 mg total) by mouth at bedtime., Disp: 90 tablet, Rfl: 1   baclofen  (LIORESAL ) 10 MG tablet, Take 1/2 tablet (5mg ) by mouth 3 (three) times daily.  As needed for hiccups, Disp: 45 tablet, Rfl: 1   Blood Glucose Monitoring Suppl (ONETOUCH VERIO FLEX SYSTEM) w/Device KIT, Check blood sugar once daily, Disp: 1 kit, Rfl: 0   carvedilol  (COREG ) 12.5 MG tablet, Take 0.5 tablets (6.25 mg total) by mouth 2 (two) times daily with a meal., Disp: 90 tablet, Rfl: 1   cloNIDine  (CATAPRES ) 0.1 MG tablet, Take 1 tablet (0.1 mg total) by mouth 2 (two) times daily., Disp: 180 tablet, Rfl: 1   Colchicine  0.6 MG CAPS, Take 1 capsule (0.6 mg total) by mouth 2 (two) times daily as needed., Disp: 180 capsule, Rfl: 0   Cyanocobalamin  (VITAMIN B-12) 1000 MCG/15ML LIQD, Take 1,000 mcg by mouth daily., Disp: , Rfl:    dexamethasone  (DECADRON ) 1 MG tablet, Take 3 tablets (3 mg total) by mouth daily with breakfast for 7 days, THEN 2 tablets (2 mg total) daily with breakfast for 7 days, THEN 1 tablet (1 mg total) daily with breakfast for 7 days., Disp: 42 tablet, Rfl: 0   diphenhydramine-acetaminophen  (TYLENOL  PM) 25-500 MG TABS tablet, Take 1 tablet by mouth at bedtime as needed., Disp: , Rfl:    glucose blood test strip, Check blood sugars once daily, Disp: 100 each, Rfl: 12   Lancets (ONETOUCH ULTRASOFT) lancets, Check bloods sugars once daily, Disp: 100 each, Rfl: 12   levETIRAcetam  (KEPPRA ) 500 MG tablet, Take 1 tablet (500 mg total) by mouth 2 (two) times daily., Disp: 60 tablet, Rfl: 3   lisinopril  (ZESTRIL ) 20 MG tablet, Take 20 mg by mouth at bedtime., Disp: , Rfl:    melatonin 5 MG TABS, Take 1 tablet (5 mg total) by mouth at bedtime., Disp: 30 tablet, Rfl: 0   metFORMIN  (GLUCOPHAGE ) 1000 MG tablet, Take 1 tablet (1,000 mg total) by mouth 2 (two) times daily with a meal., Disp: 180 tablet, Rfl: 1   ondansetron  (ZOFRAN ) 8 MG tablet, Take 1 tablet (8 mg total) by mouth every 8 (eight) hours as needed for nausea or vomiting. May take 30-60 minutes prior to Temodar  administration if nausea/vomiting occurs as needed., Disp: 30 tablet, Rfl: 1   pantoprazole  (PROTONIX ) 40  MG tablet, Take 1 tablet (40 mg total) by mouth daily., Disp: 30 tablet, Rfl: 1   pioglitazone  (ACTOS ) 45 MG tablet, Take 1 tablet (45 mg total) by mouth daily., Disp: 90 tablet, Rfl: 1   polyethylene glycol (MIRALAX  / GLYCOLAX ) 17 g packet, Take 17 g by mouth daily as needed for mild constipation., Disp: 14 each, Rfl: 0   QUEtiapine  (SEROQUEL ) 25 MG tablet, Take 1 tablet (25 mg total) by mouth at bedtime., Disp: 30 tablet, Rfl: 1   sitaGLIPtin  (JANUVIA ) 100 MG tablet, Take 1 tablet (100 mg total) by mouth daily., Disp: 90 tablet, Rfl: 1   sodium chloride  1 g tablet, Take 1 tablet (1 g total) by mouth 3 (three) times daily with meals., Disp: 90 tablet, Rfl: 0   tamsulosin  (FLOMAX ) 0.4 MG CAPS capsule, Take 1 capsule (0.4 mg total) by mouth daily after supper., Disp: 90 capsule, Rfl: 1   temozolomide  (TEMODAR )  100 MG capsule, Take 3 capsules (300 mg total) by mouth at bedtime. Take for 5 days on, then off for 23 days. Repeat every 28 days. Take on an empty stomach to decrease nausea & vomiting., Disp: 15 capsule, Rfl: 0  Observations/Objective: Patient is well-developed, well-nourished in no acute distress.  Resting comfortably  at home.  Head is normocephalic, atraumatic.  No labored breathing.  Speech is clear and coherent with logical content.  Patient is alert and oriented at baseline.    Assessment and Plan: 1. Rash (Primary)  Keep area clean and dry, UC if sx worsen. They had apptmt at [facility] today but he was unable to walk out of the house due to weakness.   Follow Up Instructions: I discussed the assessment and treatment plan with the patient. The patient was provided an opportunity to ask questions and all were answered. The patient agreed with the plan and demonstrated an understanding of the instructions.  A copy of instructions were sent to the patient via MyChart unless otherwise noted below.     The patient was advised to call back or seek an in-person evaluation if the symptoms  worsen or if the condition fails to improve as anticipated.    Beatric Fulop, FNP

## 2023-10-17 NOTE — Patient Instructions (Signed)
 Rash, Adult A rash is a breakout of spots or blotches on the skin. It can affect the way the skin looks and feels. Many things can cause a rash. Common causes include: Viral infections. These include colds, measles, and hand, foot, and mouth disease. Bacterial infections. These include scarlet fever and impetigo. Fungal infections. These include athlete's foot, ringworm, and yeast rashes. Skin irritation. This may be from heat rash, exposure to moisture or friction for a long time (intertrigo), or exposure to soap or skin care products (eczema). Allergic reactions. These may be caused by foods, medicines, or things like poison ivy. Some rashes may go away after a few days. Others may last for a few weeks. The goal of treatment is to stop the itching and keep the rash from spreading. Follow these instructions at home: Medicine Take or apply over-the-counter and prescription medicines only as told by your health care provider. These may include: Corticosteroids. These can help treat red or swollen skin. They may be given as creams or as medicines to take by mouth (orally). Anti-itch lotions. Allergy medicines. Pain medicine. Antifungal medicine if the rash is from a fungal infection. Antibiotics if you have an infection.  Skin care Apply cool, wet cloths (compresses) to the affected areas. Do not scratch or rub your skin. Avoid covering the rash. Keep it exposed to air as often as you can. Managing itching and discomfort Avoid hot showers and baths. These can make itching worse. A cold shower may help. Try taking a bath with: Epsom salts. You can get these at your local pharmacy or grocery store. Follow the instructions on the package. Baking soda. Pour a small amount into the bath as told by your provider. Colloidal oatmeal. You can get this at your local pharmacy or grocery store. Follow the instructions on the package. Try putting baking soda paste on your skin. Stir water into baking  soda until it becomes like a paste. Try using calamine lotion or cortisone cream to help with itchiness. Keep cool. Stay out of the sun. Sweating and being hot can make itching worse. General instructions  Rest as needed. Drink enough fluid to keep your pee (urine) pale yellow. Wear loose-fitting clothes. Avoid scented soaps, detergents, and perfumes. Use gentle soaps, detergents, perfumes, and cosmetics. Avoid the things that cause your rash (triggers). Keep a journal to help keep track of your triggers. Write down: What you eat. What cosmetics you use. What you drink. What you wear. This includes jewelry. Contact a health care provider if: You sweat at night more than normal. You pee (urinate) more or less than normal, or your pee is a darker color than normal. Your eyes become sensitive to light. Your skin or the white parts of your eyes turn yellow (jaundice). Your skin tingles or is numb. You get painful blisters in your nose or mouth. Your rash does not go away after a few days, or it gets worse. You are more tired or thirsty than normal. You have new or worse symptoms. These may include: Pain in your abdomen. Fever. Diarrhea or vomiting. Weakness or weight loss. Get help right away if: You get confused. You have a severe headache, a stiff neck, or severe joint pain or stiffness. You become very sleepy or not responsive. You have a seizure. This information is not intended to replace advice given to you by your health care provider. Make sure you discuss any questions you have with your health care provider. Document Revised: 06/13/2022 Document Reviewed:  06/13/2022 Elsevier Patient Education  2024 [employer].

## 2023-10-19 ENCOUNTER — Telehealth: Payer: Self-pay | Admitting: *Deleted

## 2023-10-19 NOTE — Telephone Encounter (Signed)
 Patients wife requested prescription for stairlift to get assistance with tax reimbursement.  Form requested to Dr Mark Sil.  Will need to be faxed if approved.   Second request was for us  to obtain PA for [employer] stairlift with [employer].  I called [employer] and was advised that [employer] will not pay any towards a stairlift and patient and family should have been made aware.  There is no durable medical equipment code for a stairlift.  They did state that some policies will pay some towards a chair lift which is similar to a recliner that rises which is not the product that they received.    Made wife aware and she will reach out to [facility] again to see if this information is correct.

## 2023-10-20 ENCOUNTER — Inpatient Hospital Stay: Payer: Medicare Other | Admitting: Internal Medicine

## 2023-10-20 DIAGNOSIS — C719 Malignant neoplasm of brain, unspecified: Secondary | ICD-10-CM | POA: Diagnosis not present

## 2023-10-20 DIAGNOSIS — R569 Unspecified convulsions: Secondary | ICD-10-CM

## 2023-10-20 NOTE — Progress Notes (Signed)
I connected with Alex Church on 10/20/23 at  2:30 PM EST by telephone visit and verified that I am speaking with the correct person using two identifiers.  I discussed the limitations, risks, security and privacy concerns of performing an evaluation and management service by telemedicine and the availability of in-person appointments. I also discussed with the patient that there may be a patient responsible charge related to this service. The patient expressed understanding and agreed to proceed.   Other persons participating in the visit and their role in the encounter:  spouse  Patient's location:  Home Provider's location:  Office Chief Complaint:  Glioblastoma, IDH-wildtype (HCC)  Seizure (HCC)  History of Present Ilness: Alex Church reports improvement in GI symptoms with the baclofen, lowering decadron.  Now down to 1mg  daily.  The seroquel has helped his nocturnal irritability and behavior symptoms, though not fully.  Per his wife, he remains at times difficult to control at night, with awakenings and inappropriate behavior that is not present during the daytime.  No further seizures, headaches.  Observations: Language and cognition at baseline  Assessment and Plan: Glioblastoma, IDH-wildtype (HCC)  Seizure (HCC)  Recommended proceeding with cycle #2 Temozolomide as discussed.  Dose will remain at 150mg /m2 dose level given clinical concerns and GI symptoms.  Protonix will keep at 40mg  daily for now.   Agreeable with increasing Seroquel to 50mg  HS for sleep and nocturnal behavioral issues.    Follow Up Instructions:   We ask that Alex Church return to clinic in 1 months with labs prior to cycle #3, or sooner as needed.  I discussed the assessment and treatment plan with the patient.  The patient was provided an opportunity to ask questions and all were answered.  The patient agreed with the plan and demonstrated understanding of the instructions.    The patient was  advised to call back or seek an in-person evaluation if the symptoms worsen or if the condition fails to improve as anticipated.    Henreitta Leber, MD   I provided 20 minutes of non face-to-face telephone visit time during this encounter, and > 50% was spent counseling as documented under my assessment & plan.

## 2023-10-21 ENCOUNTER — Other Ambulatory Visit: Payer: Self-pay

## 2023-10-21 ENCOUNTER — Other Ambulatory Visit: Payer: Self-pay | Admitting: Internal Medicine

## 2023-10-21 ENCOUNTER — Telehealth: Payer: Self-pay | Admitting: Internal Medicine

## 2023-10-21 ENCOUNTER — Encounter: Payer: Self-pay | Admitting: Internal Medicine

## 2023-10-21 DIAGNOSIS — C719 Malignant neoplasm of brain, unspecified: Secondary | ICD-10-CM

## 2023-10-21 NOTE — Progress Notes (Signed)
Specialty Pharmacy Refill Coordination Note  Alex Church is a 78 y.o. male contacted today regarding refills of specialty medication(s) Temozolomide [name])   Patient requested [name] at [facility] at [place] date: 10/23/23   Medication will be filled on 10/22/23. Pending Refill Request.   Patient confirmed ready to start cycle 2 Temodar. RR from Dr. Barbaraann Cao appt 2/11  Please call and confirm with patient once RX is recieved for PPU.

## 2023-10-21 NOTE — Telephone Encounter (Signed)
Patient is scheduled. Advised to call to reschedule if needed.

## 2023-10-22 ENCOUNTER — Encounter: Payer: Self-pay | Admitting: Internal Medicine

## 2023-10-22 ENCOUNTER — Telehealth: Payer: Self-pay | Admitting: *Deleted

## 2023-10-22 ENCOUNTER — Other Ambulatory Visit: Payer: Self-pay

## 2023-10-22 DIAGNOSIS — R569 Unspecified convulsions: Secondary | ICD-10-CM | POA: Diagnosis not present

## 2023-10-22 DIAGNOSIS — E538 Deficiency of other specified B group vitamins: Secondary | ICD-10-CM | POA: Diagnosis not present

## 2023-10-22 DIAGNOSIS — E785 Hyperlipidemia, unspecified: Secondary | ICD-10-CM | POA: Diagnosis not present

## 2023-10-22 DIAGNOSIS — C711 Malignant neoplasm of frontal lobe: Secondary | ICD-10-CM | POA: Diagnosis not present

## 2023-10-22 DIAGNOSIS — Z7984 Long term (current) use of oral hypoglycemic drugs: Secondary | ICD-10-CM | POA: Diagnosis not present

## 2023-10-22 DIAGNOSIS — M1A9XX1 Chronic gout, unspecified, with tophus (tophi): Secondary | ICD-10-CM | POA: Diagnosis not present

## 2023-10-22 DIAGNOSIS — E119 Type 2 diabetes mellitus without complications: Secondary | ICD-10-CM | POA: Diagnosis not present

## 2023-10-22 DIAGNOSIS — I10 Essential (primary) hypertension: Secondary | ICD-10-CM | POA: Diagnosis not present

## 2023-10-22 NOTE — Telephone Encounter (Signed)
"  Alex Church's wife Alex Church () calling about three forms from [employer].  What is the fax number and process?  E-mail the ROI to my e-mail: [email or web address]."   Reviewed forms process and timeframe, provided fax number [phone], E-mailed [facility] Cover Sheet and [employer]A form as requested.  Denies further questions or needs.  Awaiting [employer] Paperwork and signed ROI.

## 2023-10-22 NOTE — Progress Notes (Signed)
Dr. Barbaraann Cao placed a referral for home health and PT. I contacted Haynes Bast at [facility] for these orders she stated they could not do this due to not having these services in [facility]. I then followed up with Carma Lair at [facility] to see if they could fill the services. She stated they couldn't do it but she had a contact to get it done. She contacted Barbara Cower at [facility]. He was able to complete this referral and will be seeing the patient on 02/14 as per [facility] with [facility] is Friday 02/14.

## 2023-10-22 NOTE — Telephone Encounter (Signed)
Please contact patient's wife, he  needs to be seen.  I know I am solidly booked, okay to see a different provider.  If  is impossible for him to come, get a UA urine culture on a sterile container. Dx LUTS  Also advise ER if fever, chills, lower abdomen pain or swelling.

## 2023-10-23 ENCOUNTER — Other Ambulatory Visit: Payer: Self-pay | Admitting: Internal Medicine

## 2023-10-23 ENCOUNTER — Other Ambulatory Visit: Payer: Self-pay

## 2023-10-23 DIAGNOSIS — C719 Malignant neoplasm of brain, unspecified: Secondary | ICD-10-CM

## 2023-10-23 MED ORDER — TEMOZOLOMIDE 100 MG PO CAPS
150.0000 mg/m2/d | ORAL_CAPSULE | Freq: Every day | ORAL | 0 refills | Status: DC
  Filled 2023-10-23: qty 15, 28d supply, fill #0

## 2023-10-26 ENCOUNTER — Encounter: Payer: Self-pay | Admitting: Internal Medicine

## 2023-10-26 ENCOUNTER — Other Ambulatory Visit: Payer: Self-pay | Admitting: *Deleted

## 2023-10-26 DIAGNOSIS — C719 Malignant neoplasm of brain, unspecified: Secondary | ICD-10-CM

## 2023-10-27 ENCOUNTER — Inpatient Hospital Stay (HOSPITAL_COMMUNITY)
Admission: EM | Admit: 2023-10-27 | Discharge: 2023-11-05 | DRG: 100 | Disposition: A | Discharge status: Skilled Nursing Facility | Payer: Medicare Other | Attending: Family Medicine | Admitting: Family Medicine

## 2023-10-27 ENCOUNTER — Inpatient Hospital Stay (HOSPITAL_COMMUNITY): Payer: Medicare Other

## 2023-10-27 ENCOUNTER — Emergency Department (HOSPITAL_COMMUNITY): Payer: Medicare Other

## 2023-10-27 ENCOUNTER — Encounter (HOSPITAL_COMMUNITY): Payer: Self-pay | Admitting: Emergency Medicine

## 2023-10-27 ENCOUNTER — Other Ambulatory Visit (HOSPITAL_COMMUNITY): Payer: Self-pay

## 2023-10-27 ENCOUNTER — Other Ambulatory Visit: Payer: Self-pay

## 2023-10-27 DIAGNOSIS — J9600 Acute respiratory failure, unspecified whether with hypoxia or hypercapnia: Secondary | ICD-10-CM | POA: Diagnosis not present

## 2023-10-27 DIAGNOSIS — B965 Pseudomonas (aeruginosa) (mallei) (pseudomallei) as the cause of diseases classified elsewhere: Secondary | ICD-10-CM | POA: Diagnosis present

## 2023-10-27 DIAGNOSIS — R0689 Other abnormalities of breathing: Secondary | ICD-10-CM | POA: Diagnosis not present

## 2023-10-27 DIAGNOSIS — Z79899 Other long term (current) drug therapy: Secondary | ICD-10-CM | POA: Diagnosis not present

## 2023-10-27 DIAGNOSIS — Z794 Long term (current) use of insulin: Secondary | ICD-10-CM

## 2023-10-27 DIAGNOSIS — N390 Urinary tract infection, site not specified: Secondary | ICD-10-CM | POA: Diagnosis present

## 2023-10-27 DIAGNOSIS — E869 Volume depletion, unspecified: Secondary | ICD-10-CM | POA: Diagnosis not present

## 2023-10-27 DIAGNOSIS — Z833 Family history of diabetes mellitus: Secondary | ICD-10-CM | POA: Diagnosis not present

## 2023-10-27 DIAGNOSIS — G9341 Metabolic encephalopathy: Secondary | ICD-10-CM | POA: Diagnosis not present

## 2023-10-27 DIAGNOSIS — M6281 Muscle weakness (generalized): Secondary | ICD-10-CM | POA: Diagnosis not present

## 2023-10-27 DIAGNOSIS — G9389 Other specified disorders of brain: Secondary | ICD-10-CM | POA: Diagnosis present

## 2023-10-27 DIAGNOSIS — E440 Moderate protein-calorie malnutrition: Secondary | ICD-10-CM | POA: Insufficient documentation

## 2023-10-27 DIAGNOSIS — E876 Hypokalemia: Secondary | ICD-10-CM | POA: Diagnosis not present

## 2023-10-27 DIAGNOSIS — R000 Tachycardia, unspecified: Secondary | ICD-10-CM | POA: Diagnosis not present

## 2023-10-27 DIAGNOSIS — I10 Essential (primary) hypertension: Secondary | ICD-10-CM | POA: Diagnosis not present

## 2023-10-27 DIAGNOSIS — F05 Delirium due to known physiological condition: Secondary | ICD-10-CM | POA: Diagnosis not present

## 2023-10-27 DIAGNOSIS — Z85841 Personal history of malignant neoplasm of brain: Secondary | ICD-10-CM

## 2023-10-27 DIAGNOSIS — R41841 Cognitive communication deficit: Secondary | ICD-10-CM | POA: Diagnosis not present

## 2023-10-27 DIAGNOSIS — R404 Transient alteration of awareness: Secondary | ICD-10-CM | POA: Diagnosis not present

## 2023-10-27 DIAGNOSIS — Z9221 Personal history of antineoplastic chemotherapy: Secondary | ICD-10-CM

## 2023-10-27 DIAGNOSIS — R278 Other lack of coordination: Secondary | ICD-10-CM | POA: Diagnosis not present

## 2023-10-27 DIAGNOSIS — E119 Type 2 diabetes mellitus without complications: Secondary | ICD-10-CM

## 2023-10-27 DIAGNOSIS — E871 Hypo-osmolality and hyponatremia: Secondary | ICD-10-CM | POA: Diagnosis present

## 2023-10-27 DIAGNOSIS — E1165 Type 2 diabetes mellitus with hyperglycemia: Secondary | ICD-10-CM | POA: Diagnosis present

## 2023-10-27 DIAGNOSIS — G8384 Todd's paralysis (postepileptic): Secondary | ICD-10-CM | POA: Diagnosis present

## 2023-10-27 DIAGNOSIS — J9811 Atelectasis: Secondary | ICD-10-CM | POA: Diagnosis not present

## 2023-10-27 DIAGNOSIS — I672 Cerebral atherosclerosis: Secondary | ICD-10-CM | POA: Diagnosis not present

## 2023-10-27 DIAGNOSIS — G40901 Epilepsy, unspecified, not intractable, with status epilepticus: Principal | ICD-10-CM | POA: Diagnosis present

## 2023-10-27 DIAGNOSIS — R9089 Other abnormal findings on diagnostic imaging of central nervous system: Secondary | ICD-10-CM | POA: Diagnosis not present

## 2023-10-27 DIAGNOSIS — R569 Unspecified convulsions: Secondary | ICD-10-CM | POA: Diagnosis not present

## 2023-10-27 DIAGNOSIS — E785 Hyperlipidemia, unspecified: Secondary | ICD-10-CM | POA: Diagnosis not present

## 2023-10-27 DIAGNOSIS — R1312 Dysphagia, oropharyngeal phase: Secondary | ICD-10-CM | POA: Diagnosis not present

## 2023-10-27 DIAGNOSIS — I2510 Atherosclerotic heart disease of native coronary artery without angina pectoris: Secondary | ICD-10-CM | POA: Diagnosis not present

## 2023-10-27 DIAGNOSIS — Z6823 Body mass index (BMI) 23.0-23.9, adult: Secondary | ICD-10-CM

## 2023-10-27 DIAGNOSIS — T41205 Adverse effect of unspecified general anesthetics: Secondary | ICD-10-CM | POA: Insufficient documentation

## 2023-10-27 DIAGNOSIS — C719 Malignant neoplasm of brain, unspecified: Secondary | ICD-10-CM | POA: Diagnosis not present

## 2023-10-27 DIAGNOSIS — Z7984 Long term (current) use of oral hypoglycemic drugs: Secondary | ICD-10-CM | POA: Diagnosis not present

## 2023-10-27 DIAGNOSIS — T41295A Adverse effect of other general anesthetics, initial encounter: Secondary | ICD-10-CM | POA: Diagnosis present

## 2023-10-27 DIAGNOSIS — G40509 Epileptic seizures related to external causes, not intractable, without status epilepticus: Secondary | ICD-10-CM | POA: Diagnosis not present

## 2023-10-27 DIAGNOSIS — Z8249 Family history of ischemic heart disease and other diseases of the circulatory system: Secondary | ICD-10-CM

## 2023-10-27 DIAGNOSIS — I6782 Cerebral ischemia: Secondary | ICD-10-CM | POA: Diagnosis not present

## 2023-10-27 DIAGNOSIS — Z923 Personal history of irradiation: Secondary | ICD-10-CM

## 2023-10-27 DIAGNOSIS — Z7401 Bed confinement status: Secondary | ICD-10-CM | POA: Diagnosis not present

## 2023-10-27 DIAGNOSIS — R531 Weakness: Secondary | ICD-10-CM | POA: Diagnosis not present

## 2023-10-27 DIAGNOSIS — R2689 Other abnormalities of gait and mobility: Secondary | ICD-10-CM | POA: Diagnosis not present

## 2023-10-27 DIAGNOSIS — R0989 Other specified symptoms and signs involving the circulatory and respiratory systems: Secondary | ICD-10-CM | POA: Diagnosis not present

## 2023-10-27 DIAGNOSIS — G40909 Epilepsy, unspecified, not intractable, without status epilepticus: Secondary | ICD-10-CM | POA: Diagnosis not present

## 2023-10-27 DIAGNOSIS — R140 Abdominal distension (gaseous): Secondary | ICD-10-CM | POA: Diagnosis not present

## 2023-10-27 DIAGNOSIS — Z4682 Encounter for fitting and adjustment of non-vascular catheter: Secondary | ICD-10-CM | POA: Diagnosis not present

## 2023-10-27 DIAGNOSIS — T380X5A Adverse effect of glucocorticoids and synthetic analogues, initial encounter: Secondary | ICD-10-CM | POA: Diagnosis not present

## 2023-10-27 DIAGNOSIS — I952 Hypotension due to drugs: Secondary | ICD-10-CM | POA: Diagnosis present

## 2023-10-27 DIAGNOSIS — J90 Pleural effusion, not elsewhere classified: Secondary | ICD-10-CM | POA: Diagnosis not present

## 2023-10-27 DIAGNOSIS — I4711 Inappropriate sinus tachycardia, so stated: Secondary | ICD-10-CM | POA: Diagnosis not present

## 2023-10-27 LAB — CBC WITH DIFFERENTIAL/PLATELET
Abs Immature Granulocytes: 0.28 10*3/uL — ABNORMAL HIGH (ref 0.00–0.07)
Basophils Absolute: 0.1 10*3/uL (ref 0.0–0.1)
Basophils Relative: 1 %
Eosinophils Absolute: 0 10*3/uL (ref 0.0–0.5)
Eosinophils Relative: 0 %
HCT: 38.1 % — ABNORMAL LOW (ref 39.0–52.0)
Hemoglobin: 12.3 g/dL — ABNORMAL LOW (ref 13.0–17.0)
Immature Granulocytes: 3 %
Lymphocytes Relative: 50 %
Lymphs Abs: 4.1 10*3/uL — ABNORMAL HIGH (ref 0.7–4.0)
MCH: 30.8 pg (ref 26.0–34.0)
MCHC: 32.3 g/dL (ref 30.0–36.0)
MCV: 95.5 fL (ref 80.0–100.0)
Monocytes Absolute: 0.6 10*3/uL (ref 0.1–1.0)
Monocytes Relative: 7 %
Neutro Abs: 3.3 10*3/uL (ref 1.7–7.7)
Neutrophils Relative %: 39 %
Platelets: 245 10*3/uL (ref 150–400)
RBC: 3.99 MIL/uL — ABNORMAL LOW (ref 4.22–5.81)
RDW: 15.4 % (ref 11.5–15.5)
WBC: 8.4 10*3/uL (ref 4.0–10.5)
nRBC: 0.4 % — ABNORMAL HIGH (ref 0.0–0.2)

## 2023-10-27 LAB — COMPREHENSIVE METABOLIC PANEL
ALT: 18 U/L (ref 0–44)
AST: 25 U/L (ref 15–41)
Albumin: 3.1 g/dL — ABNORMAL LOW (ref 3.5–5.0)
Alkaline Phosphatase: 103 U/L (ref 38–126)
Anion gap: 19 — ABNORMAL HIGH (ref 5–15)
BUN: 16 mg/dL (ref 8–23)
CO2: 18 mmol/L — ABNORMAL LOW (ref 22–32)
Calcium: 8.4 mg/dL — ABNORMAL LOW (ref 8.9–10.3)
Chloride: 95 mmol/L — ABNORMAL LOW (ref 98–111)
Creatinine, Ser: 1.4 mg/dL — ABNORMAL HIGH (ref 0.61–1.24)
GFR, Estimated: 52 mL/min — ABNORMAL LOW (ref >60)
Glucose, Bld: 201 mg/dL — ABNORMAL HIGH (ref 70–99)
Potassium: 4.6 mmol/L (ref 3.5–5.1)
Sodium: 132 mmol/L — ABNORMAL LOW (ref 135–145)
Total Bilirubin: 0.7 mg/dL (ref 0.0–1.2)
Total Protein: 6.6 g/dL (ref 6.5–8.1)

## 2023-10-27 LAB — I-STAT CHEM 8, ED
BUN: 14 mg/dL (ref 8–23)
Calcium, Ion: 1.09 mmol/L — ABNORMAL LOW (ref 1.15–1.40)
Chloride: 98 mmol/L (ref 98–111)
Creatinine, Ser: 1.4 mg/dL — ABNORMAL HIGH (ref 0.61–1.24)
Glucose, Bld: 196 mg/dL — ABNORMAL HIGH (ref 70–99)
HCT: 37 % — ABNORMAL LOW (ref 39.0–52.0)
Hemoglobin: 12.6 g/dL — ABNORMAL LOW (ref 13.0–17.0)
Potassium: 4.7 mmol/L (ref 3.5–5.1)
Sodium: 135 mmol/L (ref 135–145)
TCO2: 20 mmol/L — ABNORMAL LOW (ref 22–32)

## 2023-10-27 LAB — BLOOD GAS, ARTERIAL
Acid-base deficit: 3.8 mmol/L — ABNORMAL HIGH (ref 0.0–2.0)
Bicarbonate: 18.5 mmol/L — ABNORMAL LOW (ref 20.0–28.0)
Drawn by: 28340
FIO2: 100 %
O2 Saturation: 99.1 %
Patient temperature: 37
pCO2 arterial: 26 mm[Hg] — ABNORMAL LOW (ref 32–48)
pH, Arterial: 7.46 — ABNORMAL HIGH (ref 7.35–7.45)
pO2, Arterial: 466 mm[Hg] — ABNORMAL HIGH (ref 83–108)

## 2023-10-27 LAB — CBG MONITORING, ED: Glucose-Capillary: 206 mg/dL — ABNORMAL HIGH (ref 70–99)

## 2023-10-27 LAB — MAGNESIUM: Magnesium: 1.2 mg/dL — ABNORMAL LOW (ref 1.7–2.4)

## 2023-10-27 MED ORDER — FENTANYL CITRATE PF 50 MCG/ML IJ SOSY
PREFILLED_SYRINGE | INTRAMUSCULAR | Status: AC
  Administered 2023-10-27: 50 ug via INTRAVENOUS
  Filled 2023-10-27: qty 1

## 2023-10-27 MED ORDER — LORAZEPAM 2 MG/ML IJ SOLN
INTRAMUSCULAR | Status: AC
  Filled 2023-10-27: qty 1

## 2023-10-27 MED ORDER — FENTANYL CITRATE PF 50 MCG/ML IJ SOSY: 50.0000 ug | PREFILLED_SYRINGE | Freq: Once | INTRAMUSCULAR | Status: AC

## 2023-10-27 MED ORDER — ETOMIDATE 2 MG/ML IV SOLN
INTRAVENOUS | Status: AC
  Administered 2023-10-27: 20 mg via INTRAVENOUS
  Filled 2023-10-27: qty 10

## 2023-10-27 MED ORDER — SODIUM CHLORIDE 0.9 % IV SOLN
2000.0000 mg | Freq: Once | INTRAVENOUS | Status: AC
  Administered 2023-10-27: 2000 mg via INTRAVENOUS

## 2023-10-27 MED ORDER — FENTANYL CITRATE PF 50 MCG/ML IJ SOSY
25.0000 ug | PREFILLED_SYRINGE | Freq: Once | INTRAMUSCULAR | Status: AC
  Administered 2023-10-28: 25 ug via INTRAVENOUS

## 2023-10-27 MED ORDER — SUCCINYLCHOLINE CHLORIDE 200 MG/10ML IV SOSY: 100.0000 mg | PREFILLED_SYRINGE | Freq: Once | INTRAVENOUS | Status: AC

## 2023-10-27 MED ORDER — PROPOFOL 1000 MG/100ML IV EMUL
5.0000 ug/kg/min | INTRAVENOUS | Status: DC
  Administered 2023-10-27 (×2): 5 ug/kg/min via INTRAVENOUS
  Administered 2023-10-28: 20 ug/kg/min via INTRAVENOUS
  Administered 2023-10-28: 10 ug/kg/min via INTRAVENOUS
  Filled 2023-10-27 (×2): qty 100

## 2023-10-27 MED ORDER — ORAL CARE MOUTH RINSE: 15.0000 mL | OROMUCOSAL | Status: DC | PRN

## 2023-10-27 MED ORDER — DOCUSATE SODIUM 100 MG PO CAPS: 100.0000 mg | ORAL_CAPSULE | Freq: Two times a day (BID) | ORAL | Status: DC | PRN

## 2023-10-27 MED ORDER — SUCCINYLCHOLINE CHLORIDE 200 MG/10ML IV SOSY
PREFILLED_SYRINGE | INTRAVENOUS | Status: AC
  Administered 2023-10-27: 100 mg via INTRAVENOUS
  Filled 2023-10-27: qty 10

## 2023-10-27 MED ORDER — NOREPINEPHRINE 4 MG/250ML-% IV SOLN: 2.0000 ug/min | INTRAVENOUS | Status: DC

## 2023-10-27 MED ORDER — SODIUM CHLORIDE 0.9 % IV SOLN
250.0000 mL | INTRAVENOUS | Status: AC
  Administered 2023-10-27: 250 mL via INTRAVENOUS

## 2023-10-27 MED ORDER — PROPOFOL 1000 MG/100ML IV EMUL
INTRAVENOUS | Status: AC
Start: 2023-10-27 — End: 2023-10-28
  Filled 2023-10-27: qty 100

## 2023-10-27 MED ORDER — LEVETIRACETAM IN NACL 1000 MG/100ML IV SOLN
INTRAVENOUS | Status: AC
  Administered 2023-10-27: 1000 mg via INTRAVENOUS
  Filled 2023-10-27: qty 200

## 2023-10-27 MED ORDER — ONDANSETRON HCL 4 MG/2ML IJ SOLN
4.0000 mg | Freq: Four times a day (QID) | INTRAMUSCULAR | Status: DC | PRN
  Administered 2023-10-30: 4 mg via INTRAVENOUS
  Filled 2023-10-27: qty 2

## 2023-10-27 MED ORDER — DOCUSATE SODIUM 50 MG/5ML PO LIQD
100.0000 mg | Freq: Two times a day (BID) | ORAL | Status: DC
  Administered 2023-10-28 (×3): 100 mg
  Filled 2023-10-27 (×3): qty 10

## 2023-10-27 MED ORDER — MAGNESIUM SULFATE 4 GM/100ML IV SOLN
4.0000 g | Freq: Once | INTRAVENOUS | Status: AC
  Administered 2023-10-27: 4 g via INTRAVENOUS
  Filled 2023-10-27: qty 100

## 2023-10-27 MED ORDER — INSULIN ASPART 100 UNIT/ML IJ SOLN
0.0000 [IU] | INTRAMUSCULAR | Status: DC
  Administered 2023-10-28: 5 [IU] via SUBCUTANEOUS
  Administered 2023-10-28: 3 [IU] via SUBCUTANEOUS
  Administered 2023-10-28: 2 [IU] via SUBCUTANEOUS

## 2023-10-27 MED ORDER — SODIUM CHLORIDE 0.9 % IV BOLUS
2000.0000 mL | Freq: Once | INTRAVENOUS | Status: AC
  Administered 2023-10-27: 2000 mL via INTRAVENOUS

## 2023-10-27 MED ORDER — ETOMIDATE 2 MG/ML IV SOLN: 20.0000 mg | Freq: Once | INTRAVENOUS | Status: AC

## 2023-10-27 MED ORDER — LORAZEPAM 2 MG/ML IJ SOLN
2.0000 mg | Freq: Once | INTRAMUSCULAR | Status: AC
  Administered 2023-10-27: 2 mg via INTRAVENOUS

## 2023-10-27 MED ORDER — CHLORHEXIDINE GLUCONATE CLOTH 2 % EX PADS
6.0000 | MEDICATED_PAD | Freq: Every day | CUTANEOUS | Status: DC
  Administered 2023-10-28 – 2023-11-01 (×6): 6 via TOPICAL

## 2023-10-27 MED ORDER — LEVETIRACETAM IN NACL 1000 MG/100ML IV SOLN
1000.0000 mg | Freq: Once | INTRAVENOUS | Status: AC
  Filled 2023-10-27: qty 100

## 2023-10-27 MED ORDER — NOREPINEPHRINE 4 MG/250ML-% IV SOLN
INTRAVENOUS | Status: AC
Start: 2023-10-27 — End: 2023-10-27
  Administered 2023-10-27: 2 ug/min via INTRAVENOUS
  Filled 2023-10-27: qty 250

## 2023-10-27 MED ORDER — FENTANYL BOLUS VIA INFUSION
25.0000 ug | INTRAVENOUS | Status: DC | PRN
  Administered 2023-10-28 (×2): 25 ug via INTRAVENOUS
  Administered 2023-10-28 (×2): 50 ug via INTRAVENOUS
  Administered 2023-10-29: 75 ug via INTRAVENOUS
  Administered 2023-10-29: 50 ug via INTRAVENOUS

## 2023-10-27 MED ORDER — SODIUM CHLORIDE 0.9 % IV SOLN
1500.0000 mg | Freq: Once | INTRAVENOUS | Status: AC
  Administered 2023-10-28: 1500 mg via INTRAVENOUS
  Filled 2023-10-27 (×2): qty 30

## 2023-10-27 MED ORDER — ORAL CARE MOUTH RINSE
15.0000 mL | OROMUCOSAL | Status: DC
  Administered 2023-10-27 – 2023-10-29 (×18): 15 mL via OROMUCOSAL

## 2023-10-27 MED ORDER — POLYETHYLENE GLYCOL 3350 17 G PO PACK
17.0000 g | PACK | Freq: Every day | ORAL | Status: DC | PRN
Start: 2023-10-27 — End: 2023-10-27

## 2023-10-27 MED ORDER — PANTOPRAZOLE SODIUM 40 MG IV SOLR
40.0000 mg | Freq: Every day | INTRAVENOUS | Status: DC
Start: 2023-10-27 — End: 2023-10-29
  Administered 2023-10-28 (×2): 40 mg via INTRAVENOUS
  Filled 2023-10-27 (×2): qty 10

## 2023-10-27 MED ORDER — POLYETHYLENE GLYCOL 3350 17 G PO PACK
17.0000 g | PACK | Freq: Every day | ORAL | Status: DC
  Administered 2023-10-28: 17 g
  Filled 2023-10-27: qty 1

## 2023-10-27 MED ORDER — FENTANYL 2500MCG IN NS 250ML (10MCG/ML) PREMIX INFUSION
25.0000 ug/h | INTRAVENOUS | Status: DC
  Administered 2023-10-28: 25 ug/h via INTRAVENOUS
  Administered 2023-10-29: 75 ug/h via INTRAVENOUS
  Filled 2023-10-27 (×2): qty 250

## 2023-10-27 NOTE — ED Notes (Signed)
 Unable to insert foley catheter, condom cath placed.

## 2023-10-27 NOTE — Progress Notes (Addendum)
 Pt transported to CT and back on the vent without incident.

## 2023-10-27 NOTE — Consult Note (Incomplete)
 NEUROLOGY CONSULT NOTE   Date of service: October 27, 2023 Patient Name: Alex Church MRN:  [id number] DOB:  21-Nov-1945 Chief Complaint: "Status epilepticus" Requesting Provider: Jacalyn Lefevre, MD  History of Present Illness  Alex Church is a 78 y.o. male with hx of glioblastoma who presents with acute onset status epilepticus.  He started seizing at home and was brought into the emergency department by EMS.  He was given five of Versed prior to arrival.  In the emergency department, he received  He has a history of previous seizure, and is currently taking Keppra 500 twice daily  At baseline, he has some left-sided weakness but is able to walk with a walker.  Exam from 1/30 noted 4+/5 left leg weakness.  Past History   Past Medical History:  Diagnosis Date   B12 deficiency    monthly shots   Diabetes mellitus    Glaucoma suspect    Glioblastoma (HCC)    Hyperlipemia    Hypertension     Past Surgical History:  Procedure Laterality Date   APPLICATION OF CRANIAL NAVIGATION Right 05/12/2023   Procedure: APPLICATION OF CRANIAL NAVIGATION;  Surgeon: Dawley, Alan Mulder, DO;  Location: [facility] OR;  Service: Neurosurgery;  Laterality: Right;   CRANIOTOMY Right 05/12/2023   Procedure: RIGHT STERIOSTACTIC FRONTAL CRANIOTOMY FOR TUMOR RESECTION;  Surgeon: Bethann Goo, DO;  Location: [facility] OR;  Service: Neurosurgery;  Laterality: Right;    Family History: Family History  Problem Relation Age of Onset   Diabetes Maternal Uncle    Heart attack Brother 12   Cancer Mother        intra-abdominal   Cirrhosis Cousin        maternal   Diabetes Cousin        maternal   Stroke Neg Hx    Colon cancer Neg Hx    Prostate cancer Neg Hx     Social History  reports that he has never smoked. He has never used smokeless tobacco. He reports that he does not drink alcohol and does not use drugs.  No Known Allergies  Medications   Current Facility-Administered Medications:    fosPHENYtoin  (CEREBYX) 1,500 mg PE in sodium chloride 0.9 % 50 mL IVPB, 1,500 mg PE, Intravenous, Once, Jacalyn Lefevre, MD   propofol (DIPRIVAN) 1000 MG/100ML infusion, 5-80 mcg/kg/min, Intravenous, Continuous, Jacalyn Lefevre, MD, Paused at 10/27/23 2038  Current Outpatient Medications:    acetaminophen (TYLENOL) 500 MG tablet, Take 1,000 mg by mouth every 6 (six) hours as needed for mild pain (pain score 1-3) (Tries to only take twice a week)., Disp: , Rfl:    allopurinol (ZYLOPRIM) 100 MG tablet, Take 1 tablet (100 mg total) by mouth 2 (two) times daily., Disp: 180 tablet, Rfl: 1   atorvastatin (LIPITOR) 10 MG tablet, Take 1 tablet (10 mg total) by mouth at bedtime., Disp: 90 tablet, Rfl: 1   baclofen (LIORESAL) 10 MG tablet, Take 1/2 tablet (5mg ) by mouth 3 (three) times daily. As needed for hiccups, Disp: 45 tablet, Rfl: 1   Blood Glucose Monitoring Suppl (ONETOUCH VERIO FLEX SYSTEM) w/Device KIT, Check blood sugar once daily, Disp: 1 kit, Rfl: 0   carvedilol (COREG) 12.5 MG tablet, Take 0.5 tablets (6.25 mg total) by mouth 2 (two) times daily with a meal., Disp: 90 tablet, Rfl: 1   cloNIDine (CATAPRES) 0.1 MG tablet, Take 1 tablet (0.1 mg total) by mouth 2 (two) times daily., Disp: 180 tablet, Rfl: 1   clotrimazole-betamethasone (LOTRISONE) cream,  Apply 1 Application topically daily for 14 days., Disp: 14 g, Rfl: 0   Colchicine 0.6 MG CAPS, Take 1 capsule (0.6 mg total) by mouth 2 (two) times daily as needed., Disp: 180 capsule, Rfl: 0   Cyanocobalamin (VITAMIN B-12) 1000 MCG/15ML LIQD, Take 1,000 mcg by mouth daily., Disp: , Rfl:    dexamethasone (DECADRON) 1 MG tablet, Take 3 tablets (3 mg total) by mouth daily with breakfast for 7 days, THEN 2 tablets (2 mg total) daily with breakfast for 7 days, THEN 1 tablet (1 mg total) daily with breakfast for 7 days., Disp: 42 tablet, Rfl: 0   diphenhydramine-acetaminophen (TYLENOL PM) 25-500 MG TABS tablet, Take 1 tablet by mouth at bedtime as needed., Disp: , Rfl:     glucose blood test strip, Check blood sugars once daily, Disp: 100 each, Rfl: 12   Lancets (ONETOUCH ULTRASOFT) lancets, Check bloods sugars once daily, Disp: 100 each, Rfl: 12   levETIRAcetam (KEPPRA) 500 MG tablet, Take 1 tablet (500 mg total) by mouth 2 (two) times daily., Disp: 60 tablet, Rfl: 3   lisinopril (ZESTRIL) 20 MG tablet, Take 20 mg by mouth at bedtime., Disp: , Rfl:    melatonin 5 MG TABS, Take 1 tablet (5 mg total) by mouth at bedtime., Disp: 30 tablet, Rfl: 0   metFORMIN (GLUCOPHAGE) 1000 MG tablet, Take 1 tablet (1,000 mg total) by mouth 2 (two) times daily with a meal., Disp: 180 tablet, Rfl: 1   ondansetron (ZOFRAN) 8 MG tablet, Take 1 tablet (8 mg total) by mouth every 8 (eight) hours as needed for nausea or vomiting. May take 30-60 minutes prior to Temodar administration if nausea/vomiting occurs as needed., Disp: 30 tablet, Rfl: 1   pantoprazole (PROTONIX) 40 MG tablet, Take 1 tablet (40 mg total) by mouth daily., Disp: 30 tablet, Rfl: 1   pioglitazone (ACTOS) 45 MG tablet, Take 1 tablet (45 mg total) by mouth daily., Disp: 90 tablet, Rfl: 1   polyethylene glycol (MIRALAX / GLYCOLAX) 17 g packet, Take 17 g by mouth daily as needed for mild constipation., Disp: 14 each, Rfl: 0   QUEtiapine (SEROQUEL) 25 MG tablet, Take 1 tablet (25 mg total) by mouth at bedtime., Disp: 30 tablet, Rfl: 1   sitaGLIPtin (JANUVIA) 100 MG tablet, Take 1 tablet (100 mg total) by mouth daily., Disp: 90 tablet, Rfl: 1   sodium chloride 1 g tablet, Take 1 tablet (1 g total) by mouth 3 (three) times daily with meals., Disp: 90 tablet, Rfl: 0   tamsulosin (FLOMAX) 0.4 MG CAPS capsule, Take 1 capsule (0.4 mg total) by mouth daily after supper., Disp: 90 capsule, Rfl: 1   temozolomide (TEMODAR) 100 MG capsule, Take 3 capsules (300 mg total) by mouth at bedtime. Take for 5 days on, then off for 23 days. Repeat every 28 days. Take on an empty stomach to decrease nausea & vomiting., Disp: 15 capsule, Rfl:  0  Vitals   Vitals:   10/27/23 2035 10/27/23 2040 10/27/23 2045 10/27/23 2050  BP: (!) 75/54 (!) 85/58 (!) 88/57 (!) 87/60  Pulse: (!) 110 (!) 105 98 97  Resp: 16 16 18 16   Temp:      TempSrc:      SpO2: 100% 100% 100% 100%  Weight:      Height:        Body mass index is 25.12 kg/m.  Physical Exam   Constitutional: Appears well-developed and well-nourished.   Neurologic Examination    Neuro: Mental Status:  Patient is intubated, he has purposeful movements of the right side, but does not follow commands. Cranial Nerves: II: Visual Fields are full. Pupils are equal, round, and reactive to light.   III,IV, VI: Eyes are midline, not deviated, no nystagmus V:VII: Blinks to eyelid stimulation bilaterally Motor: He has good movement of his right arm and leg.  He has minimal movement of the left side Sensory: He grimaces to noxious stimulation on the left, but not as much as the right  Cerebellar: Does not perform   Labs/Imaging/Neurodiagnostic studies   CBC:  Recent Labs  Lab 2023/10/31 2005 October 31, 2023 2025  WBC  --  8.4  NEUTROABS  --  3.3  HGB 12.6* 12.3*  HCT 37.0* 38.1*  MCV  --  95.5  PLT  --  245   Basic Metabolic Panel:  Lab Results  Component Value Date   NA 132 (L) 10-31-23   K 4.6 Oct 31, 2023   CO2 18 (L) Oct 31, 2023   GLUCOSE 201 (H) 2023-10-31   BUN 16 10/31/2023   CREATININE 1.40 (H) October 31, 2023   CALCIUM 8.4 (L) 10/31/2023   GFRNONAA 52 (L) 31-Oct-2023   GFRAA 126 08/26/2007   Lipid Panel:  Lab Results  Component Value Date   LDLCALC 40 07/20/2023   HgbA1c:  Lab Results  Component Value Date   HGBA1C 6.8 (H) 07/20/2023   Urine Drug Screen:     Component Value Date/Time   LABOPIA NONE DETECTED 07/12/2023 0112   COCAINSCRNUR NONE DETECTED 07/12/2023 0112   LABBENZ NONE DETECTED 07/12/2023 0112   AMPHETMU NONE DETECTED 07/12/2023 0112   THCU NONE DETECTED 07/12/2023 0112   LABBARB NONE DETECTED 07/12/2023 0112    Alcohol Level      Component Value Date/Time   ETH <10 07/12/2023 0329   INR  Lab Results  Component Value Date   INR 1.1 05/06/2023   APTT No results found for: "APTT" AED levels: No results found for: "PHENYTOIN", "ZONISAMIDE", "LAMOTRIGINE", "LEVETIRACETA"  CT Head without contrast(Personally reviewed): No acute changes ASSESSMENT   Alex Church is a 78 y.o. male with new onset status epilepticus.  His magnesium was quite low and is certainly possible that this is the reason he had such a difficult to control status epilepticus.  He was given fosphenytoin load, but I would favor simply continuing on an increased dose of Keppra as opposed to adding a second agent.  It is not uncommon for people with structural lesions such as a tumor to have more prolonged Todd's phenomenon.  His left side will avoid continue to make progress.  RECOMMENDATIONS  Keppra 1 g twice daily Please maintain normal mag levels LTM EEG If his left-sided weakness does not improve, may need to consider MRI, but low suspicion for stroke at this point. Neurology will follow ______________________________________________________________________  This patient is critically ill and at significant risk of neurological worsening, death and care requires constant monitoring of vital signs, hemodynamics,respiratory and cardiac monitoring, neurological assessment, discussion with family, other specialists and medical decision making of high complexity. I spent 55 minutes of neurocritical care time  in the care of  this patient. This was time spent independent of any time provided by nurse practitioner or PA.  Ritta Slot, MD [facility]   If 7pm- 7am, please page neurology on call as listed in AMION. 10/28/2023  4:18 AM

## 2023-10-27 NOTE — Progress Notes (Signed)
 LTM EEG hooked up and running - no initial skin breakdown - push button tested - [facility] monitoring.

## 2023-10-27 NOTE — ED Triage Notes (Signed)
 Pt has been seizing x 30 minutes, ems gave pt 5mg  versed.

## 2023-10-27 NOTE — ED Provider Notes (Signed)
 [place] EMERGENCY DEPARTMENT AT [facility] Provider Note   CSN: [id number] Arrival date & time: 10/27/23  1957     History  Chief Complaint  Patient presents with   Seizures    Alex Church is a 78 y.o. male.  Pt is a 78 yo male with pmhx significant for DM, HLD, HTN, and Glioblastoma.  Pt had a craniotomy in September and had chemo and radiation.  Per wife, pt had one seizure in November, but has been seizure free since then.  Tonight, pt started having a seizure which did not stop, so she called EMS.  EMS gave pt 5 mg versed en route and pt remained seizing.  Total time seizing prior to arrival around 15 minutes.  Pt is unable to give any hx.       Home Medications Prior to Admission medications   Medication Sig Start Date End Date Taking? Authorizing Provider  acetaminophen (TYLENOL) 500 MG tablet Take 1,000 mg by mouth every 6 (six) hours as needed for mild pain (pain score 1-3) (Tries to only take twice a week).    [provider]  allopurinol (ZYLOPRIM) 100 MG tablet Take 1 tablet (100 mg total) by mouth 2 (two) times daily. 06/16/23   Wanda Plump, MD  atorvastatin (LIPITOR) 10 MG tablet Take 1 tablet (10 mg total) by mouth at bedtime. 07/08/23   Wanda Plump, MD  baclofen (LIORESAL) 10 MG tablet Take 1/2 tablet (5mg ) by mouth 3 (three) times daily. As needed for hiccups 10/14/23   Henreitta Leber, MD  Blood Glucose Monitoring Suppl (ONETOUCH VERIO FLEX SYSTEM) w/Device KIT Check blood sugar once daily 05/25/23   Wanda Plump, MD  carvedilol (COREG) 12.5 MG tablet Take 0.5 tablets (6.25 mg total) by mouth 2 (two) times daily with a meal. 08/17/23   Wanda Plump, MD  cloNIDine (CATAPRES) 0.1 MG tablet Take 1 tablet (0.1 mg total) by mouth 2 (two) times daily. 06/16/23   Wanda Plump, MD  clotrimazole-betamethasone (LOTRISONE) cream Apply 1 Application topically daily for 14 days. 10/17/23 10/31/23  Delorse Lek, FNP  Colchicine 0.6 MG CAPS Take 1 capsule (0.6 mg  total) by mouth 2 (two) times daily as needed. 08/03/23   Wanda Plump, MD  Cyanocobalamin (VITAMIN B-12) 1000 MCG/15ML LIQD Take 1,000 mcg by mouth daily.    [provider]  dexamethasone (DECADRON) 1 MG tablet Take 3 tablets (3 mg total) by mouth daily with breakfast for 7 days, THEN 2 tablets (2 mg total) daily with breakfast for 7 days, THEN 1 tablet (1 mg total) daily with breakfast for 7 days. 10/08/23 10/29/23  Henreitta Leber, MD  diphenhydramine-acetaminophen (TYLENOL PM) 25-500 MG TABS tablet Take 1 tablet by mouth at bedtime as needed.    [provider]  glucose blood test strip Check blood sugars once daily 05/25/23   Wanda Plump, MD  Lancets [facility] ULTRASOFT) lancets Check bloods sugars once daily 05/25/23   Wanda Plump, MD  levETIRAcetam (KEPPRA) 500 MG tablet Take 1 tablet (500 mg total) by mouth 2 (two) times daily. 07/30/23   Henreitta Leber, MD  lisinopril (ZESTRIL) 20 MG tablet Take 20 mg by mouth at bedtime.    [provider]  melatonin 5 MG TABS Take 1 tablet (5 mg total) by mouth at bedtime. 05/15/23   Regalado, Belkys A, MD  metFORMIN (GLUCOPHAGE) 1000 MG tablet Take 1 tablet (1,000 mg total) by mouth  2 (two) times daily with a meal. 09/15/23   Paz, Nolon Rod, MD  ondansetron (ZOFRAN) 8 MG tablet Take 1 tablet (8 mg total) by mouth every 8 (eight) hours as needed for nausea or vomiting. May take 30-60 minutes prior to Temodar administration if nausea/vomiting occurs as needed. 08/11/23   Henreitta Leber, MD  pantoprazole (PROTONIX) 40 MG tablet Take 1 tablet (40 mg total) by mouth daily. 10/08/23   Henreitta Leber, MD  pioglitazone (ACTOS) 45 MG tablet Take 1 tablet (45 mg total) by mouth daily. 09/15/23   Wanda Plump, MD  polyethylene glycol (MIRALAX / GLYCOLAX) 17 g packet Take 17 g by mouth daily as needed for mild constipation. 05/15/23   Regalado, Belkys A, MD  QUEtiapine (SEROQUEL) 25 MG tablet Take 1 tablet (25 mg total) by mouth at bedtime. 10/13/23    Vaslow, Georgeanna Lea, MD  sitaGLIPtin (JANUVIA) 100 MG tablet Take 1 tablet (100 mg total) by mouth daily. 10/01/23   Wanda Plump, MD  sodium chloride 1 g tablet Take 1 tablet (1 g total) by mouth 3 (three) times daily with meals. 05/22/23   Setzer, Lynnell Jude, PA-C  tamsulosin (FLOMAX) 0.4 MG CAPS capsule Take 1 capsule (0.4 mg total) by mouth daily after supper. 09/07/23   Wanda Plump, MD  temozolomide (TEMODAR) 100 MG capsule Take 3 capsules (300 mg total) by mouth at bedtime. Take for 5 days on, then off for 23 days. Repeat every 28 days. Take on an empty stomach to decrease nausea & vomiting. 10/23/23   Henreitta Leber, MD      Allergies    Patient has no known allergies.    Review of Systems   Review of Systems  Unable to perform ROS: Mental status change    Physical Exam Updated Vital Signs BP (!) 90/57   Pulse 89   Temp 97.8 F (36.6 C) (Axillary)   Resp 16   Ht 6' (1.829 m)   Wt 84 kg   SpO2 100%   BMI 25.12 kg/m  Physical Exam Vitals and nursing note reviewed.  Constitutional:      General: He is in acute distress.     Appearance: He is ill-appearing.  HENT:     Head: Normocephalic and atraumatic.     Comments: Eyes deviated to the left with nystagmus to the left and to center    Right Ear: External ear normal.     Left Ear: External ear normal.     Nose: Nose normal.     Mouth/Throat:     Mouth: Mucous membranes are moist.     Pharynx: Oropharynx is clear.  Cardiovascular:     Rate and Rhythm: Tachycardia present.     Pulses: Normal pulses.     Heart sounds: Normal heart sounds.  Pulmonary:     Effort: Pulmonary effort is normal.  Abdominal:     General: Abdomen is flat. Bowel sounds are normal.     Palpations: Abdomen is soft.  Musculoskeletal:        General: Normal range of motion.     Cervical back: No rigidity.  Skin:    General: Skin is warm.     Capillary Refill: Capillary refill takes less than 2 seconds.  Neurological:     Comments: Left arm  twitching; pt seizing  Psychiatric:     Comments: Unable to assess     ED Results / Procedures / Treatments   Labs (all labs ordered  are listed, but only abnormal results are displayed) Labs Reviewed  COMPREHENSIVE METABOLIC PANEL - Abnormal; Notable for the following components:      Result Value   Sodium 132 (*)    Chloride 95 (*)    CO2 18 (*)    Glucose, Bld 201 (*)    Creatinine, Ser 1.40 (*)    Calcium 8.4 (*)    Albumin 3.1 (*)    GFR, Estimated 52 (*)    Anion gap 19 (*)    All other components within normal limits  CBC WITH DIFFERENTIAL/PLATELET - Abnormal; Notable for the following components:   RBC 3.99 (*)    Hemoglobin 12.3 (*)    HCT 38.1 (*)    nRBC 0.4 (*)    Lymphs Abs 4.1 (*)    Abs Immature Granulocytes 0.28 (*)    All other components within normal limits  MAGNESIUM - Abnormal; Notable for the following components:   Magnesium 1.2 (*)    All other components within normal limits  BLOOD GAS, ARTERIAL - Abnormal; Notable for the following components:   pH, Arterial 7.46 (*)    pCO2 arterial 26 (*)    pO2, Arterial 466 (*)    Bicarbonate 18.5 (*)    Acid-base deficit 3.8 (*)    All other components within normal limits  I-STAT CHEM 8, ED - Abnormal; Notable for the following components:   Creatinine, Ser 1.40 (*)    Glucose, Bld 196 (*)    Calcium, Ion 1.09 (*)    TCO2 20 (*)    Hemoglobin 12.6 (*)    HCT 37.0 (*)    All other components within normal limits  CBG MONITORING, ED - Abnormal; Notable for the following components:   Glucose-Capillary 206 (*)    All other components within normal limits  URINALYSIS, ROUTINE W REFLEX MICROSCOPIC  TRIGLYCERIDES    EKG EKG Interpretation Date/Time:  Tuesday October 27 2023 20:45:01 EST Ventricular Rate:  98 PR Interval:  156 QRS Duration:  86 QT Interval:  369 QTC Calculation: 472 R Axis:   75  Text Interpretation: Sinus rhythm No significant change since last tracing Confirmed by Jacalyn Lefevre [phone]) on 10/27/2023 9:34:20 PM  Radiology No results found.  Procedures Date/Time: 10/27/2023 9:35 PM  Performed by: Jacalyn Lefevre, MDPreoxygenation: Pre-oxygenation with 100% oxygen Induction Type: Rapid sequence Ventilation: Mask ventilation without difficulty Laryngoscope Size: Glidescope Tube size: 7.5 mm Number of attempts: 1 Placement Confirmation: ETT inserted through vocal cords under direct vision, Breath sounds checked- equal and bilateral and Positive ETCO2 Secured at: 23 cm Tube secured with: ETT holder Dental Injury: Teeth and Oropharynx as per pre-operative assessment         Medications Ordered in ED Medications  propofol (DIPRIVAN) 1000 MG/100ML infusion (0 mcg/kg/min  84 kg Intravenous Paused 10/27/23 2038)  fosPHENYtoin (CEREBYX) 1,500 mg PE in sodium chloride 0.9 % 50 mL IVPB (has no administration in time range)  magnesium sulfate IVPB 4 g 100 mL (4 g Intravenous New Bag/Given 10/27/23 2116)  0.9 %  sodium chloride infusion (250 mLs Intravenous New Bag/Given 10/27/23 2118)  norepinephrine (LEVOPHED) 4mg  in (0.016 mg/mL) premix infusion (3 mcg/min Intravenous Rate/Dose Change 10/27/23 2110)  levETIRAcetam (KEPPRA) 2,000 mg in sodium chloride 0.9 % 250 mL IVPB (0 mg Intravenous Stopped 10/27/23 2015)  LORazepam (ATIVAN) injection 2 mg (2 mg Intravenous Given 10/27/23 2003)  LORazepam (ATIVAN) injection 2 mg (2 mg Intravenous Given 10/27/23 2002)  etomidate (AMIDATE) injection 20 mg (  20 mg Intravenous Given 10/27/23 2013)  succinylcholine (ANECTINE) syringe 100 mg (100 mg Intravenous Given 10/27/23 2014)  LORazepam (ATIVAN) injection 2 mg (2 mg Intravenous Given 10/27/23 2008)  levETIRAcetam (KEPPRA) IVPB 1000 mg/100 mL premix (0 mg Intravenous Stopped 10/27/23 2056)  fentaNYL (SUBLIMAZE) injection 50 mcg (50 mcg Intravenous Given 10/27/23 2056)  sodium chloride 0.9 % bolus 2,000 mL (0 mLs Intravenous Stopped 10/27/23 2116)    ED Course/ Medical Decision  Making/ A&P                                 Medical Decision Making Amount and/or Complexity of Data Reviewed Labs: ordered. Radiology: ordered.  Risk Prescription drug management. Decision regarding hospitalization.   This patient presents to the ED for concern of seizure, this involves an extensive number of treatment options, and is a complaint that carries with it a high risk of complications and morbidity.  The differential diagnosis includes status epilepticus, electrolyte abn, brain abn   Co morbidities that complicate the patient evaluation  DM, HLD, HTN, and Glioblastoma   Additional history obtained:  Additional history obtained from epic chart review External records from outside source obtained and reviewed including EMS report/wife   Lab Tests:  I Ordered, and personally interpreted labs.  The pertinent results include:  cbc with hgb 12.3 (stable); cmp with glucose elevated at 201, cr 1.4 (stable)   Imaging Studies ordered:  I ordered imaging studies including cxr, CT head  CXR not read at the time of transfer by the radiologist, but tube is in good position.   Cardiac Monitoring:  The patient was maintained on a cardiac monitor.  I personally viewed and interpreted the cardiac monitored which showed an underlying rhythm of: st   Medicines ordered and prescription drug management:  I ordered medication including keppra and ativan  for seizure  Reevaluation of the patient after these medicines showed that the patient improved I have reviewed the patients home medicines and have made adjustments as needed   Test Considered:  ct   Critical Interventions:  intubation   Consultations Obtained:  I requested consultation with the neurologist (Dr. Amada Jupiter),  and discussed lab and imaging findings as well as pertinent plan - he requests tx to [facility] for continuous EEG.  He also requests Cerebyx.   Pt d/w Dr. Vassie Loll ([facility]) who will admit  Problem  List / ED Course:  Status epilepticus: pt still seizing after 5 mg versed, 6 mg ativan, 2 g keppra.  Cerebyx, ordered, but there is not a pharmacist here in the evening, so he could not give that.  We gave him an additional gram of keppra, for a total of 3 g.  Pt is no longer having any eye twitching and HR is back to normal, so I think he has stopped.  CT head ordered, but [facility] is here to get him.  I think it can wait until he's at [facility]. Resp failure:  pt lost airway due to all the bzd, so he was intubated. Hypotension:  occurred after propofol and all the meds.  It is not improving after propofol turned off and ivfs, so levo started Hypomagnesemia:  4 g mg given iv   Reevaluation:  After the interventions noted above, I reevaluated the patient and found that they have :improved   Social Determinants of Health:  Lives at home   Dispostion:  After consideration of the diagnostic results  and the patients response to treatment, I feel that the patent would benefit from admission.  CRITICAL CARE Performed by: Jacalyn Lefevre   Total critical care time: 90 minutes  Critical care time was exclusive of separately billable procedures and treating other patients.  Critical care was necessary to treat or prevent imminent or life-threatening deterioration.  Critical care was time spent personally by me on the following activities: development of treatment plan with patient and/or surrogate as well as nursing, discussions with consultants, evaluation of patient's response to treatment, examination of patient, obtaining history from patient or surrogate, ordering and performing treatments and interventions, ordering and review of laboratory studies, ordering and review of radiographic studies, pulse oximetry and re-evaluation of patient's condition.           Final Clinical Impression(s) / ED Diagnoses Final diagnoses:  Status epilepticus (HCC)  Acute respiratory failure,  unspecified whether with hypoxia or hypercapnia (HCC)  Hypomagnesemia    Rx / DC Orders ED Discharge Orders     None         Jacalyn Lefevre, MD 10/27/23 2136

## 2023-10-28 ENCOUNTER — Inpatient Hospital Stay (HOSPITAL_COMMUNITY): Payer: Medicare Other

## 2023-10-28 ENCOUNTER — Other Ambulatory Visit: Payer: Self-pay

## 2023-10-28 DIAGNOSIS — G40901 Epilepsy, unspecified, not intractable, with status epilepticus: Secondary | ICD-10-CM

## 2023-10-28 DIAGNOSIS — E119 Type 2 diabetes mellitus without complications: Secondary | ICD-10-CM | POA: Diagnosis not present

## 2023-10-28 DIAGNOSIS — E440 Moderate protein-calorie malnutrition: Secondary | ICD-10-CM | POA: Insufficient documentation

## 2023-10-28 LAB — URINALYSIS, W/ REFLEX TO CULTURE (INFECTION SUSPECTED)
Bilirubin Urine: NEGATIVE
Glucose, UA: >=500 mg/dL — AB
Ketones, ur: 5 mg/dL — AB
Nitrite: POSITIVE — AB
Protein, ur: 30 mg/dL — AB
Specific Gravity, Urine: 1.013 (ref 1.005–1.030)
WBC, UA: >50 WBC/hpf (ref 0–5)
pH: 5 (ref 5.0–8.0)

## 2023-10-28 LAB — BASIC METABOLIC PANEL
Anion gap: 16 — ABNORMAL HIGH (ref 5–15)
BUN: 14 mg/dL (ref 8–23)
CO2: 17 mmol/L — ABNORMAL LOW (ref 22–32)
Calcium: 7.9 mg/dL — ABNORMAL LOW (ref 8.9–10.3)
Chloride: 101 mmol/L (ref 98–111)
Creatinine, Ser: 1.07 mg/dL (ref 0.61–1.24)
GFR, Estimated: >60 mL/min (ref >60)
Glucose, Bld: 243 mg/dL — ABNORMAL HIGH (ref 70–99)
Potassium: 3.9 mmol/L (ref 3.5–5.1)
Sodium: 134 mmol/L — ABNORMAL LOW (ref 135–145)

## 2023-10-28 LAB — URINALYSIS, ROUTINE W REFLEX MICROSCOPIC
Bilirubin Urine: NEGATIVE
Glucose, UA: 50 mg/dL — AB
Ketones, ur: 20 mg/dL — AB
Nitrite: POSITIVE — AB
Protein, ur: 30 mg/dL — AB
Specific Gravity, Urine: 1.014 (ref 1.005–1.030)
WBC, UA: >50 WBC/hpf (ref 0–5)
pH: 5 (ref 5.0–8.0)

## 2023-10-28 LAB — CBC
HCT: 34.8 % — ABNORMAL LOW (ref 39.0–52.0)
Hemoglobin: 11.4 g/dL — ABNORMAL LOW (ref 13.0–17.0)
MCH: 30.2 pg (ref 26.0–34.0)
MCHC: 32.8 g/dL (ref 30.0–36.0)
MCV: 92.1 fL (ref 80.0–100.0)
Platelets: 225 10*3/uL (ref 150–400)
RBC: 3.78 MIL/uL — ABNORMAL LOW (ref 4.22–5.81)
RDW: 15.4 % (ref 11.5–15.5)
WBC: 8.8 10*3/uL (ref 4.0–10.5)
nRBC: 0 % (ref 0.0–0.2)

## 2023-10-28 LAB — GLUCOSE, CAPILLARY
Glucose-Capillary: 134 mg/dL — ABNORMAL HIGH (ref 70–99)
Glucose-Capillary: 170 mg/dL — ABNORMAL HIGH (ref 70–99)
Glucose-Capillary: 175 mg/dL — ABNORMAL HIGH (ref 70–99)
Glucose-Capillary: 175 mg/dL — ABNORMAL HIGH (ref 70–99)
Glucose-Capillary: 179 mg/dL — ABNORMAL HIGH (ref 70–99)
Glucose-Capillary: 239 mg/dL — ABNORMAL HIGH (ref 70–99)
Glucose-Capillary: 271 mg/dL — ABNORMAL HIGH (ref 70–99)

## 2023-10-28 LAB — MRSA NEXT GEN BY PCR, NASAL: MRSA by PCR Next Gen: DETECTED — AB

## 2023-10-28 LAB — PHOSPHORUS
Phosphorus: 3.7 mg/dL (ref 2.5–4.6)
Phosphorus: 3.2 mg/dL (ref 2.5–4.6)
Phosphorus: 2.6 mg/dL (ref 2.5–4.6)

## 2023-10-28 LAB — MAGNESIUM
Magnesium: 1.9 mg/dL (ref 1.7–2.4)
Magnesium: 1.8 mg/dL (ref 1.7–2.4)
Magnesium: 1.4 mg/dL — ABNORMAL LOW (ref 1.7–2.4)

## 2023-10-28 MED ORDER — VITAL 1.5 CAL PO LIQD
1000.0000 mL | ORAL | Status: DC
Start: 2023-10-28 — End: 2023-10-30
  Administered 2023-10-28: 1000 mL

## 2023-10-28 MED ORDER — ACETAMINOPHEN 325 MG PO TABS
650.0000 mg | ORAL_TABLET | ORAL | Status: DC | PRN
  Filled 2023-10-28: qty 2

## 2023-10-28 MED ORDER — ENOXAPARIN SODIUM 40 MG/0.4ML IJ SOSY
40.0000 mg | PREFILLED_SYRINGE | INTRAMUSCULAR | Status: DC
  Administered 2023-10-28 – 2023-11-05 (×9): 40 mg via SUBCUTANEOUS
  Filled 2023-10-28 (×9): qty 0.4

## 2023-10-28 MED ORDER — LEVETIRACETAM IN NACL 1500 MG/100ML IV SOLN: 1500.0000 mg | Freq: Two times a day (BID) | INTRAVENOUS | Status: DC

## 2023-10-28 MED ORDER — SODIUM CHLORIDE 0.9 % IV SOLN
2.0000 g | INTRAVENOUS | Status: DC
  Administered 2023-10-28 – 2023-10-29 (×2): 2 g via INTRAVENOUS
  Filled 2023-10-28 (×2): qty 20

## 2023-10-28 MED ORDER — ADULT MULTIVITAMIN W/MINERALS CH
1.0000 | ORAL_TABLET | Freq: Every day | ORAL | Status: DC
  Administered 2023-10-28: 1
  Filled 2023-10-28: qty 1

## 2023-10-28 MED ORDER — MAGNESIUM SULFATE 4 GM/100ML IV SOLN
4.0000 g | Freq: Once | INTRAVENOUS | Status: AC
  Administered 2023-10-28: 4 g via INTRAVENOUS
  Filled 2023-10-28: qty 100

## 2023-10-28 MED ORDER — LEVETIRACETAM IN NACL 1000 MG/100ML IV SOLN
1000.0000 mg | Freq: Two times a day (BID) | INTRAVENOUS | Status: DC
  Administered 2023-10-28 – 2023-11-01 (×9): 1000 mg via INTRAVENOUS
  Filled 2023-10-28 (×9): qty 100

## 2023-10-28 MED ORDER — MUPIROCIN 2 % EX OINT
1.0000 | TOPICAL_OINTMENT | Freq: Two times a day (BID) | CUTANEOUS | Status: AC
  Administered 2023-10-28 – 2023-11-01 (×10): 1 via NASAL
  Filled 2023-10-28: qty 22

## 2023-10-28 MED ORDER — PROSOURCE TF20 ENFIT COMPATIBL EN LIQD
60.0000 mL | Freq: Every day | ENTERAL | Status: DC
  Administered 2023-10-28: 60 mL
  Filled 2023-10-28: qty 60

## 2023-10-28 MED ORDER — VITAL HIGH PROTEIN PO LIQD: 1000.0000 mL | ORAL | Status: DC

## 2023-10-28 MED ORDER — INSULIN ASPART 100 UNIT/ML IJ SOLN
0.0000 [IU] | INTRAMUSCULAR | Status: DC
  Administered 2023-10-28: 2 [IU] via SUBCUTANEOUS
  Administered 2023-10-28 (×3): 3 [IU] via SUBCUTANEOUS
  Administered 2023-10-29: 8 [IU] via SUBCUTANEOUS
  Administered 2023-10-29: 11 [IU] via SUBCUTANEOUS
  Administered 2023-10-29: 3 [IU] via SUBCUTANEOUS
  Administered 2023-10-29: 2 [IU] via SUBCUTANEOUS
  Administered 2023-10-29: 5 [IU] via SUBCUTANEOUS
  Administered 2023-10-29 – 2023-10-30 (×2): 3 [IU] via SUBCUTANEOUS
  Administered 2023-10-30 (×3): 2 [IU] via SUBCUTANEOUS
  Administered 2023-10-30: 3 [IU] via SUBCUTANEOUS
  Administered 2023-10-31 (×2): 2 [IU] via SUBCUTANEOUS
  Administered 2023-10-31: 5 [IU] via SUBCUTANEOUS
  Administered 2023-10-31 – 2023-11-01 (×4): 3 [IU] via SUBCUTANEOUS
  Administered 2023-11-01: 8 [IU] via SUBCUTANEOUS
  Administered 2023-11-01 (×2): 5 [IU] via SUBCUTANEOUS
  Administered 2023-11-01: 3 [IU] via SUBCUTANEOUS
  Administered 2023-11-01: 15 [IU] via SUBCUTANEOUS
  Administered 2023-11-02: 8 [IU] via SUBCUTANEOUS
  Administered 2023-11-02: 11 [IU] via SUBCUTANEOUS
  Administered 2023-11-02: 5 [IU] via SUBCUTANEOUS
  Administered 2023-11-02: 2 [IU] via SUBCUTANEOUS
  Administered 2023-11-02: 8 [IU] via SUBCUTANEOUS
  Administered 2023-11-02: 2 [IU] via SUBCUTANEOUS
  Administered 2023-11-02: 5 [IU] via SUBCUTANEOUS
  Administered 2023-11-03: 11 [IU] via SUBCUTANEOUS
  Administered 2023-11-03: 5 [IU] via SUBCUTANEOUS
  Administered 2023-11-03: 15 [IU] via SUBCUTANEOUS
  Administered 2023-11-03: 2 [IU] via SUBCUTANEOUS
  Administered 2023-11-03: 3 [IU] via SUBCUTANEOUS
  Administered 2023-11-04: 5 [IU] via SUBCUTANEOUS
  Administered 2023-11-04: 8 [IU] via SUBCUTANEOUS
  Administered 2023-11-04: 15 [IU] via SUBCUTANEOUS
  Administered 2023-11-04: 3 [IU] via SUBCUTANEOUS
  Administered 2023-11-04: 15 [IU] via SUBCUTANEOUS
  Administered 2023-11-04: 11 [IU] via SUBCUTANEOUS
  Administered 2023-11-05: 15 [IU] via SUBCUTANEOUS
  Administered 2023-11-05 (×2): 8 [IU] via SUBCUTANEOUS
  Administered 2023-11-05: 2 [IU] via SUBCUTANEOUS
  Administered 2023-11-05: 8 [IU] via SUBCUTANEOUS

## 2023-10-28 MED ORDER — THIAMINE MONONITRATE 100 MG PO TABS
100.0000 mg | ORAL_TABLET | Freq: Every day | ORAL | Status: DC
  Administered 2023-10-28: 100 mg
  Filled 2023-10-28: qty 1

## 2023-10-28 MED ORDER — ACETAMINOPHEN 325 MG PO TABS: 650.0000 mg | ORAL_TABLET | ORAL | Status: DC | PRN

## 2023-10-28 NOTE — Progress Notes (Signed)
 Initial Nutrition Assessment  DOCUMENTATION CODES:   Non-severe (moderate) malnutrition in context of chronic illness  INTERVENTION:   Initiate tube feeding via OG tube: Vital 1.5 at 20 ml/h and increase by 10 ml every 8 hours to goal rate of 60 ml/hr (1440 ml per day)  Prosource TF20 60 ml daily   Provides 2240 kcal, 117 gm protein, 1094 ml free water daily  Monitor magnesium and phosphorus every 12 hours x 4 occurrences, MD to replete as needed, as pt is at risk for refeeding syndrome given pt meets criteria for malnutrition.  100 mg thiamine daily via tube x 7 days  MVI with minerals daily via tube  NUTRITION DIAGNOSIS:   Moderate Malnutrition related to chronic illness (cancer) as evidenced by moderate muscle depletion, moderate fat depletion.  GOAL:   Patient will meet greater than or equal to 90% of their needs  MONITOR:   TF tolerance, Labs  REASON FOR ASSESSMENT:   Consult Enteral/tube feeding initiation and management  ASSESSMENT:   Pt with PMH of glioblastoma, DM, HLD, HTN, and B12 deficiency on monthly injections admitted with new onset status epilepticus.   Pt discussed during ICU rounds and with RN and MD.   Sherron Monday with family who was at bedside. Wife reports that pt finished his second round of chemo in November and XRT before that and plans to begin a new round of chemo soon. She reports that pt tolerated his chemo and XRT with only symptom being extreme fatigue. He then started high dose steroids and had a lot of GI symptoms associated with this. He did not have an appetite, had taste changes, and severe GERD. He tried an ensure but felt it gave him diarrhea.  Per wife pt would eat 2 peanut butter crackers during the day and a good dinner. She didn't feel they were eating good unless her sister brought them food as they were both tired.  Pt was using a walker after XRT, wife stated that XRT caused damage in his brain effecting his ability to walk.  Wife  reports 27 lb weight loss since December. His usual body weight is reported to be 190-195 lb.   Medications reviewed and include: colace, SSI every 4 hours, protonix, miralax Fentanyl  Keppra Mag sulfate x 1  Levophed @ 3 mcg   Labs reviewed:  Na 134 CBG's: 179-271   18 F OG tube; per xray tip and side port overlie proximal stomach   NUTRITION - FOCUSED PHYSICAL EXAM:  Flowsheet Row Most Recent Value  Orbital Region Moderate depletion  Upper Arm Region Moderate depletion  Thoracic and Lumbar Region Moderate depletion  Buccal Region Unable to assess  Temple Region Moderate depletion  Clavicle Bone Region Moderate depletion  Clavicle and Acromion Bone Region Severe depletion  Scapular Bone Region Severe depletion  Dorsal Hand Unable to assess  Patellar Region Moderate depletion  Anterior Thigh Region Moderate depletion  Posterior Calf Region Moderate depletion  Edema (RD Assessment) Mild  Hair Reviewed  Eyes Unable to assess  Mouth Unable to assess  Skin Reviewed  Nails Unable to assess       Diet Order:   Diet Order             Diet NPO time specified  Diet effective now                   EDUCATION NEEDS:   Not appropriate for education at this time  Skin:  Skin Assessment: Skin Integrity  Issues: Skin Integrity Issues:: Stage II Stage II: buttocks  Last BM:  unknown  Height:   Ht Readings from Last 1 Encounters:  10/27/23 6' (1.829 m)    Weight:   Wt Readings from Last 1 Encounters:  10/28/23 74 kg    BMI:  Body mass index is 22.13 kg/m.  Estimated Nutritional Needs:   Kcal:  2100-2300  Protein:  110-125 grams  Fluid:  >2 L/day  Cammy Copa., RD, LDN, CNSC See AMiON for contact information

## 2023-10-28 NOTE — Progress Notes (Signed)
 vLTM maintenance  All impedances below 10k.  No skin breakdown noted at  A1  A2  F3  Fz  CZ

## 2023-10-28 NOTE — Procedures (Signed)
 Patient Name: Alex Church  MRN: [id number]  Epilepsy Attending: Charlsie Quest  Referring Physician/Provider: Rejeana Brock, MD  Duration: 10/27/2023 2303 to 10/28/2023 2303  Patient history:  78 y.o. male with new onset status epilepticus. EEG to evaluate for seizure  Level of alertness:  comatose  AEDs during EEG study: LEV, PHT, Propofol  Technical aspects: This EEG study was done with scalp electrodes positioned according to the 10-20 International system of electrode placement. Electrical activity was reviewed with band pass filter of 1-70Hz , sensitivity of 7 uV/mm, display speed of 84mm/sec with a 60Hz  notched filter applied as appropriate. EEG data were recorded continuously and digitally stored.  Video monitoring was available and reviewed as appropriate.  Description: EEG showed continuous generalized and lateralized right hemisphere 3 to 6 Hz theta-delta slowing admixed with 15 to 18 Hz beta activity distributed symmetrically and diffusely. Hyperventilation and photic stimulation were not performed.     ABNORMALITY - Continuous slow, generalized and lateralized right hemisphere  IMPRESSION: This study is suggestive of cortical dysfunction arising from right hemisphere likely secondary to underlying structural abnormality. Additionally there is severe diffuse encephalopathy likely related to sedation. No seizures or epileptiform discharges were seen throughout the recording.  Amos Gaber [name]

## 2023-10-28 NOTE — Progress Notes (Signed)
 NAME:  Alex Church, MRN:  [id number], DOB:  [birth year], LOS: 1 ADMISSION DATE:  10/27/2023, CONSULTATION DATE: 10/27/2023 REFERRING MD: Dr. Particia Nearing, CHIEF COMPLAINT: Status epilepticus  History of Present Illness:  78 year old man who presented to [facility] 2/18 with witnessed seizure activity. PMHx significant for HTN, HLD, T2DM, B12 injections and glioblastoma (s/p craniotomy, XRT and chemotherapy, maintained on Temodar).   Patient is intubated and sedated; therefore, history is obtained primarily from chart review and from patient's wife (at bedside). Patient's wife states that they were at home this evening and she glanced over to see him slumped to the left side and unable to get up. While trying to assist him, she noted significant prolonged seizure-like activity prompting EMS. Patient has had one prior seizure related to his glioblastoma approximately 3 weeks postsurgery and is maintained on Keppra for ppx. At baseline, he sometimes requires some assistance getting up, but gets around well with a walker. Wife reports in the last month she has noticed some recurrence of symptoms similar to his initial glioblastoma presentation (I.e.L sided slumping/weakness). Wife denies recent sick contacts.   Labs were notable for WBC 8.4, Hgb 12.3, Plt 38.1; Na 132, K 4.6, CO2 18, Cr 1.4 (baseline 0.9-1.0). LFTs generally WNL. Mg 1.2. ABG 7.46/pCO2 26, Bicarb 18. UA : 50 glucose, small Hgb, ketones 20, protein 30, nitrite +, large leuks. CT Head showed decreased R anterior extra-axial collection.   PCCM consulted for ICU admission/transfer from [facility].  Pertinent  Medical History   Past Medical History:  Diagnosis Date   B12 deficiency    monthly shots   Diabetes mellitus    Glaucoma suspect    Glioblastoma (HCC)    Hyperlipemia    Hypertension      Significant Hospital Events: Including procedures, antibiotic start and stop dates in addition to other pertinent events   2/18 - Presented to [facility] with  seizure-like activity. Known history of glio. Intubated   Interim History / Subjective:  Sedated Does not appear to be uncomfortable at present Overnight events  Objective   Blood pressure 105/64, pulse 96, temperature (!) 97.3 F (36.3 C), temperature source Axillary, resp. rate 14, height 6' (1.829 m), weight 74 kg, SpO2 100%.    Vent Mode: PRVC FiO2 (%):  [30 %-100 %] 30 % Set Rate:  [14 bmp-16 bmp] 14 bmp Vt Set:  [580 mL-630 mL] 580 mL PEEP:  [5 cmH20] 5 cmH20 Plateau Pressure:  [16 cmH20] 16 cmH20   Intake/Output Summary (Last 24 hours) at 10/28/2023 8295 Last data filed at 10/28/2023 0700 Gross per 24 hour  Intake 740.77 ml  Output 375 ml  Net 365.77 ml   Filed Weights   10/27/23 1958 10/27/23 2300 10/28/23 0500  Weight: 84 kg 74 kg 74 kg    Examination: General: Acutely ill-appearing HENT: Endotracheal tube in place Lungs: Clear breath sounds Cardiovascular: S1-S2 appreciated Abdomen: Soft, bowel sounds appreciated Extremities: No edema, no clubbing Neuro: Sedated GU: Foley in place  ABG noted 7.46/26/466 BUN 14, creatinine 1.07 Hematocrit 34.8 Urinalysis large leukocyte esterase, positive nitrites  Resolved Hospital Problem list     Assessment & Plan:   Status epilepticus History of glioblastoma -Appreciate neuro follow-up -Continue LTM EEG -He is on Keppra-guidance per neurology -Versed as needed for seizures -Propofol for burst suppression -Continue seizure precautions -Neuroprotective measures- normothermia, euglycemia, HOB greater than 30, head in neutral alignment, normocapnia, normoxia  History of hypertension Hyperlipidemia -Home antihypertensives on hold  Possible urinary tract infection -Follow cultures -  Send urine cultures -Empirically started on ceftriaxone  Type 2 diabetes -Continue SSI    Best Practice (right click and "Reselect all SmartList Selections" daily)   Diet/type: tubefeeds DVT prophylaxis SCD Pressure  ulcer(s): N/A GI prophylaxis: PPI Lines: N/A Foley:  N/A Code Status:  full code Last date of multidisciplinary goals of care discussion [discussed with spouse at bedside]  Labs   CBC: Recent Labs  Lab 10/27/23 2005 10/27/23 2025  WBC  --  8.4  NEUTROABS  --  3.3  HGB 12.6* 12.3*  HCT 37.0* 38.1*  MCV  --  95.5  PLT  --  245    Basic Metabolic Panel: Recent Labs  Lab 10/27/23 2005 10/27/23 2025  NA 135 132*  K 4.7 4.6  CL 98 95*  CO2  --  18*  GLUCOSE 196* 201*  BUN 14 16  CREATININE 1.40* 1.40*  CALCIUM  --  8.4*  MG  --  1.2*   GFR: Estimated Creatinine Clearance: 46.3 mL/min (A) (by C-G formula based on SCr of 1.4 mg/dL (H)). Recent Labs  Lab 10/27/23 2025  WBC 8.4    Liver Function Tests: Recent Labs  Lab 10/27/23 2025  AST 25  ALT 18  ALKPHOS 103  BILITOT 0.7  PROT 6.6  ALBUMIN 3.1*   No results for input(s): "LIPASE", "AMYLASE" in the last 168 hours. No results for input(s): "AMMONIA" in the last 168 hours.  ABG    Component Value Date/Time   PHART 7.46 (H) 10/27/2023 2100   PCO2ART 26 (L) 10/27/2023 2100   PO2ART 466 (H) 10/27/2023 2100   HCO3 18.5 (L) 10/27/2023 2100   TCO2 20 (L) 10/27/2023 2005   ACIDBASEDEF 3.8 (H) 10/27/2023 2100   O2SAT 99.1 10/27/2023 2100     Coagulation Profile: No results for input(s): "INR", "PROTIME" in the last 168 hours.  Cardiac Enzymes: No results for input(s): "CKTOTAL", "CKMB", "CKMBINDEX", "TROPONINI" in the last 168 hours.  HbA1C: Hgb A1c MFr Bld  Date/Time Value Ref Range Status  07/20/2023 10:57 AM 6.8 (H) 4.6 - 6.5 % Final    Comment:    Glycemic Control Guidelines for People with Diabetes:Non Diabetic:  <6%Goal of Therapy: <7%Additional Action Suggested:  >8%   05/06/2023 09:02 AM 6.9 (H) 4.8 - 5.6 % Final    Comment:    (NOTE) Pre diabetes:          5.7%-6.4%  Diabetes:              >6.4%  Glycemic control for   <7.0% adults with diabetes     CBG: Recent Labs  Lab  10/27/23 2050 10/27/23 2358 10/28/23 0350  GLUCAP 206* 239* 271*    Review of Systems:   Unable to provide history  Past Medical History:  He,  has a past medical history of B12 deficiency, Diabetes mellitus, Glaucoma suspect, Glioblastoma (HCC), Hyperlipemia, and Hypertension.   Surgical History:   Past Surgical History:  Procedure Laterality Date   APPLICATION OF CRANIAL NAVIGATION Right 05/12/2023   Procedure: APPLICATION OF CRANIAL NAVIGATION;  Surgeon: Dawley, Alan Mulder, DO;  Location: [facility] OR;  Service: Neurosurgery;  Laterality: Right;   CRANIOTOMY Right 05/12/2023   Procedure: RIGHT STERIOSTACTIC FRONTAL CRANIOTOMY FOR TUMOR RESECTION;  Surgeon: Bethann Goo, DO;  Location: [facility] OR;  Service: Neurosurgery;  Laterality: Right;     Social History:   reports that he has never smoked. He has never used smokeless tobacco. He reports that he does not drink alcohol and  does not use drugs.   Family History:  His family history includes Cancer in his mother; Cirrhosis in his cousin; Diabetes in his cousin and maternal uncle; Heart attack (age of onset: 7) in his brother. There is no history of Stroke, Colon cancer, or Prostate cancer.   Allergies No Known Allergies    The patient is critically ill with multiple organ systems failure and requires high complexity decision making for assessment and support, frequent evaluation and titration of therapies, application of advanced monitoring technologies and extensive interpretation of multiple databases. Critical Care Time devoted to patient care services described in this note independent of APP/resident time (if applicable)  is 33 minutes.   Virl Diamond MD [facility] Personal pager: See Amion If unanswered, please page CCM On-call: #[phone]

## 2023-10-28 NOTE — H&P (Addendum)
 NAME:  Alex Church, MRN:  [id number], DOB:  [birth year], LOS: 1 ADMISSION DATE:  10/27/2023 CONSULTATION DATE:  10/27/2023 REFERRING MD:  Particia Nearing - EDP, CHIEF COMPLAINT: Status epilepticus   History of Present Illness:  78 year old man who presented to [facility] 2/18 with witnessed seizure activity. PMHx significant for HTN, HLD, T2DM, B12 injections and glioblastoma (s/p craniotomy, XRT and chemotherapy, maintained on Temodar).  Patient is intubated and sedated; therefore, history is obtained primarily from chart review and from patient's wife (at bedside). Patient's wife states that they were at home this evening and she glanced over to see him slumped to the left side and unable to get up. While trying to assist him, she noted significant prolonged seizure-like activity prompting EMS. Patient has had one prior seizure related to his glioblastoma approximately 3 weeks postsurgery and is maintained on Keppra for ppx. At baseline, he sometimes requires some assistance getting up, but gets around well with a walker. Wife reports in the last month she has noticed some recurrence of symptoms similar to his initial glioblastoma presentation (I.e.L sided slumping/weakness). Wife denies recent sick contacts.  Labs were notable for WBC 8.4, Hgb 12.3, Plt 38.1; Na 132, K 4.6, CO2 18, Cr 1.4 (baseline 0.9-1.0). LFTs generally WNL. Mg 1.2. ABG 7.46/pCO2 26, Bicarb 18. UA with 50 glucose, small Hgb, ketones 20, protein 30, nitrite +, large leuks. CT Head showed decreased R anterior extra-axial collection.  PCCM consulted for ICU admission/transfer from [facility].  Pertinent Medical History:   Past Medical History:  Diagnosis Date   B12 deficiency    monthly shots   Diabetes mellitus    Glaucoma suspect    Glioblastoma (HCC)    Hyperlipemia    Hypertension    Significant Hospital Events: Including procedures, antibiotic start and stop dates in addition to other pertinent events   2/18 - Presented to [facility] with  seizure-like activity. Known history of glio. Intubated   Interim History / Subjective:  PCCM consulted for unsecured airway in the setting of status epilepticus.  Objective:  Blood pressure 104/60, pulse 92, temperature 98.3 F (36.8 C), temperature source Axillary, resp. rate 16, height 6' (1.829 m), weight 74 kg, SpO2 100%.    Vent Mode: PRVC FiO2 (%):  [30 %-100 %] 30 % Set Rate:  [14 bmp-16 bmp] 14 bmp Vt Set:  [580 mL-630 mL] 580 mL PEEP:  [5 cmH20] 5 cmH20 Plateau Pressure:  [16 cmH20] 16 cmH20   Intake/Output Summary (Last 24 hours) at 10/28/2023 0111 Last data filed at 10/27/2023 2015 Gross per 24 hour  Intake 165 ml  Output --  Net 165 ml   Filed Weights   10/27/23 1958 10/27/23 2300  Weight: 84 kg 74 kg   Physical Examination: General: Acutely ill-appearing older man in NAD. HEENT: [place]/AT, anicteric sclera, PERRL26mm, moist mucous membranes. Neuro:  Responds to noxious stimuli. Moving RUE/moving toes on BLE, no movement of LUE. +Corneal, +Cough, and +Gag  CV: RRR, no m/g/r. PULM: Breathing even and unlabored on vent (PEEP 5, FiO2 40%). Lung fields clear. GI: Soft, nontender, nondistended. Normoactive bowel sounds. Extremities: Trace bilateral LE edema noted. Skin: Warm/dry, no rashes.  Resolved Hospital Problem List:    Assessment & Plan:  Status epilepticus History of glioblastoma - Neuro/Epilepsy teams following, appreciate recommendations - F/u repeat CT Head - Consider MRI Brain - LTM EEG - AEDs per Neuro (Keppra), Versed PRN - Propofol gtt for burst suppression, wean as able - Seizure precautions - Neuroprotective measures: HOB >  30 degrees, normoglycemia, normothermia, electrolytes WNL  HTN HLD - Goal MAP > 65 - Fluid resuscitation as tolerated - Trend WBC, fever curve - F/u Cx data - Low threshold for antibiotic initiation - Hold home Coreg  - Cardiac monitoring - Optimize electrolytes for K > 4, Mg > 2  T2DM - SSI - CBGs Q4H - Goal CBG  140-180 - Hold home   Best Practice: (right click and "Reselect all SmartList Selections" daily)   Diet/type: NPO DVT prophylaxis: SCDs, SQH GI prophylaxis: PPI Lines: N/A Foley:  Yes, and it is still needed Code Status:  full code Last date of multidisciplinary goals of care discussion [Pending]  Labs:  CBC: Recent Labs  Lab 10/27/23 2005 10/27/23 2025  WBC  --  8.4  NEUTROABS  --  3.3  HGB 12.6* 12.3*  HCT 37.0* 38.1*  MCV  --  95.5  PLT  --  245   Basic Metabolic Panel: Recent Labs  Lab 10/27/23 2005 10/27/23 2025  NA 135 132*  K 4.7 4.6  CL 98 95*  CO2  --  18*  GLUCOSE 196* 201*  BUN 14 16  CREATININE 1.40* 1.40*  CALCIUM  --  8.4*  MG  --  1.2*   GFR: Estimated Creatinine Clearance: 46.3 mL/min (A) (by C-G formula based on SCr of 1.4 mg/dL (H)). Recent Labs  Lab 10/27/23 2025  WBC 8.4   Liver Function Tests: Recent Labs  Lab 10/27/23 2025  AST 25  ALT 18  ALKPHOS 103  BILITOT 0.7  PROT 6.6  ALBUMIN 3.1*   No results for input(s): "LIPASE", "AMYLASE" in the last 168 hours. No results for input(s): "AMMONIA" in the last 168 hours.  ABG:    Component Value Date/Time   PHART 7.46 (H) 10/27/2023 2100   PCO2ART 26 (L) 10/27/2023 2100   PO2ART 466 (H) 10/27/2023 2100   HCO3 18.5 (L) 10/27/2023 2100   TCO2 20 (L) 10/27/2023 2005   ACIDBASEDEF 3.8 (H) 10/27/2023 2100   O2SAT 99.1 10/27/2023 2100    Coagulation Profile: No results for input(s): "INR", "PROTIME" in the last 168 hours.  Cardiac Enzymes: No results for input(s): "CKTOTAL", "CKMB", "CKMBINDEX", "TROPONINI" in the last 168 hours.  HbA1C: Hgb A1c MFr Bld  Date/Time Value Ref Range Status  07/20/2023 10:57 AM 6.8 (H) 4.6 - 6.5 % Final    Comment:    Glycemic Control Guidelines for People with Diabetes:Non Diabetic:  <6%Goal of Therapy: <7%Additional Action Suggested:  >8%   05/06/2023 09:02 AM 6.9 (H) 4.8 - 5.6 % Final    Comment:    (NOTE) Pre diabetes:           5.7%-6.4%  Diabetes:              >6.4%  Glycemic control for   <7.0% adults with diabetes    CBG: Recent Labs  Lab 10/27/23 2050 10/27/23 2358  GLUCAP 206* 239*   Review of Systems:   Patient is encephalopathic and/or intubated; therefore, history has been obtained from chart review.   Past Medical History:  He,  has a past medical history of B12 deficiency, Diabetes mellitus, Glaucoma suspect, Glioblastoma (HCC), Hyperlipemia, and Hypertension.   Surgical History:   Past Surgical History:  Procedure Laterality Date   APPLICATION OF CRANIAL NAVIGATION Right 05/12/2023   Procedure: APPLICATION OF CRANIAL NAVIGATION;  Surgeon: Dawley, Alan Mulder, DO;  Location: [facility] OR;  Service: Neurosurgery;  Laterality: Right;   CRANIOTOMY Right 05/12/2023   Procedure:  RIGHT STERIOSTACTIC FRONTAL CRANIOTOMY FOR TUMOR RESECTION;  Surgeon: Dawley, Alan Mulder, DO;  Location: [facility] OR;  Service: Neurosurgery;  Laterality: Right;   Social History:   reports that he has never smoked. He has never used smokeless tobacco. He reports that he does not drink alcohol and does not use drugs.   Family History:  His family history includes Cancer in his mother; Cirrhosis in his cousin; Diabetes in his cousin and maternal uncle; Heart attack (age of onset: 72) in his brother. There is no history of Stroke, Colon cancer, or Prostate cancer.   Allergies: No Known Allergies   Home Medications: Prior to Admission medications   Medication Sig Start Date End Date Taking? Authorizing Provider  acetaminophen (TYLENOL) 500 MG tablet Take 1,000 mg by mouth every 6 (six) hours as needed for mild pain (pain score 1-3) (Tries to only take twice a week).    [provider]  allopurinol (ZYLOPRIM) 100 MG tablet Take 1 tablet (100 mg total) by mouth 2 (two) times daily. 06/16/23   Wanda Plump, MD  atorvastatin (LIPITOR) 10 MG tablet Take 1 tablet (10 mg total) by mouth at bedtime. 07/08/23   Wanda Plump, MD  baclofen  (LIORESAL) 10 MG tablet Take 1/2 tablet (5mg ) by mouth 3 (three) times daily. As needed for hiccups 10/14/23   Henreitta Leber, MD  Blood Glucose Monitoring Suppl (ONETOUCH VERIO FLEX SYSTEM) w/Device KIT Check blood sugar once daily 05/25/23   Wanda Plump, MD  carvedilol (COREG) 12.5 MG tablet Take 0.5 tablets (6.25 mg total) by mouth 2 (two) times daily with a meal. 08/17/23   Wanda Plump, MD  cloNIDine (CATAPRES) 0.1 MG tablet Take 1 tablet (0.1 mg total) by mouth 2 (two) times daily. 06/16/23   Wanda Plump, MD  clotrimazole-betamethasone (LOTRISONE) cream Apply 1 Application topically daily for 14 days. 10/17/23 10/31/23  Delorse Lek, FNP  Colchicine 0.6 MG CAPS Take 1 capsule (0.6 mg total) by mouth 2 (two) times daily as needed. 08/03/23   Wanda Plump, MD  Cyanocobalamin (VITAMIN B-12) 1000 MCG/15ML LIQD Take 1,000 mcg by mouth daily.    [provider]  dexamethasone (DECADRON) 1 MG tablet Take 3 tablets (3 mg total) by mouth daily with breakfast for 7 days, THEN 2 tablets (2 mg total) daily with breakfast for 7 days, THEN 1 tablet (1 mg total) daily with breakfast for 7 days. 10/08/23 10/29/23  Henreitta Leber, MD  diphenhydramine-acetaminophen (TYLENOL PM) 25-500 MG TABS tablet Take 1 tablet by mouth at bedtime as needed.    [provider]  glucose blood test strip Check blood sugars once daily 05/25/23   Wanda Plump, MD  Lancets [facility] ULTRASOFT) lancets Check bloods sugars once daily 05/25/23   Wanda Plump, MD  levETIRAcetam (KEPPRA) 500 MG tablet Take 1 tablet (500 mg total) by mouth 2 (two) times daily. 07/30/23   Henreitta Leber, MD  lisinopril (ZESTRIL) 20 MG tablet Take 20 mg by mouth at bedtime.    [provider]  melatonin 5 MG TABS Take 1 tablet (5 mg total) by mouth at bedtime. 05/15/23   Regalado, Belkys A, MD  metFORMIN (GLUCOPHAGE) 1000 MG tablet Take 1 tablet (1,000 mg total) by mouth 2 (two) times daily with a meal. 09/15/23   Wanda Plump, MD   ondansetron (ZOFRAN) 8 MG tablet Take 1 tablet (8 mg total) by mouth every 8 (eight) hours as needed for nausea  or vomiting. May take 30-60 minutes prior to Temodar administration if nausea/vomiting occurs as needed. 08/11/23   Henreitta Leber, MD  pantoprazole (PROTONIX) 40 MG tablet Take 1 tablet (40 mg total) by mouth daily. 10/08/23   Henreitta Leber, MD  pioglitazone (ACTOS) 45 MG tablet Take 1 tablet (45 mg total) by mouth daily. 09/15/23   Wanda Plump, MD  polyethylene glycol (MIRALAX / GLYCOLAX) 17 g packet Take 17 g by mouth daily as needed for mild constipation. 05/15/23   Regalado, Belkys A, MD  QUEtiapine (SEROQUEL) 25 MG tablet Take 1 tablet (25 mg total) by mouth at bedtime. 10/13/23   Vaslow, Georgeanna Lea, MD  sitaGLIPtin (JANUVIA) 100 MG tablet Take 1 tablet (100 mg total) by mouth daily. 10/01/23   Wanda Plump, MD  sodium chloride 1 g tablet Take 1 tablet (1 g total) by mouth 3 (three) times daily with meals. 05/22/23   Setzer, Lynnell Jude, PA-C  tamsulosin (FLOMAX) 0.4 MG CAPS capsule Take 1 capsule (0.4 mg total) by mouth daily after supper. 09/07/23   Wanda Plump, MD  temozolomide (TEMODAR) 100 MG capsule Take 3 capsules (300 mg total) by mouth at bedtime. Take for 5 days on, then off for 23 days. Repeat every 28 days. Take on an empty stomach to decrease nausea & vomiting. 10/23/23   Henreitta Leber, MD    Critical care time:   The patient is critically ill with multiple organ system failure and requires high complexity decision making for assessment and support, frequent evaluation and titration of therapies, advanced monitoring, review of radiographic studies and interpretation of complex data.   Critical Care Time devoted to patient care services, exclusive of separately billable procedures, described in this note is 34 minutes.  Tim Lair, PA-C  Pulmonary & Critical Care 10/28/23 1:11 AM  Please see [email or web address] for pager details.  From 7A-7P if no response, please  call [phone] After hours, please call ELink [phone]

## 2023-10-28 NOTE — Progress Notes (Signed)
 eLink Physician-Brief Progress Note Patient Name: Alex Church DOB: [birth year] MRN: [id number]   Date of Service  10/28/2023  HPI/Events of Note  Patient needs a right wrist restraint to prevent self-extubation.  eICU Interventions  Restraint ordered.        Thomasene Lot Lashawn Orrego 10/28/2023, 11:45 PM

## 2023-10-28 NOTE — Progress Notes (Signed)
 Same-day progress note  S: Patient seen earlier in the day by Dr. Tona Sensing see the consultation note from 10/28/2023. Seen and evaluated the patient in the neurological ICU. Remains intubated and on propofol.  Propofol was held for the exam.  O: Intubated, sedated, sedation held for exam No spontaneous movements Not much movement to noxious stimulation-only flicker movements in the upper extremities. Overnight he had significantly weaker left-sided withdrawal/movement. Wonder if still has a lot of sedation on board.  Will need to repeat exam.   Labs: Magnesium up to 1.9 from 1.2 on arrival. Sodium 134, glucose 243, no leukocytosis, hemoglobin 11.4, hematocrit 34.8, platelet count 225  Neurodiagnostics Continuous EEG overnight with continuous slowing-generalized and lateralized to right hemisphere likely secondary to underlying structural abnormality.  Additionally diffuse encephalopathy likely related to sedation.  No seizures or epileptiform discharges seen.  Imaging personally reviewed MRI brain 10/07/2023-postoperative changes in the right frontal lobe with unchanged and irregular enhancement about the resection but slightly increased surrounding T2 hyperintense signal, which could represent posttreatment change but is concerning for progressive nonenhancing infiltrative tumor.  Redemonstrated extra-axial collection subjacent to the cranioplasty, slightly decreased now measuring 7 mm-previously 11 mm.  An additional extra-axial collection anterior to the right frontal lobe measures up to 8 mm-unchanged.  CT head on arrival Decreased size of right anterior extra-axial collection subjacent to craniotomy site.  Postoperative changes of the right frontal lobe with areas of encephalomalacia and gliosis.  Assessment and plan: 77/M with new onset status epilepticus.  Given the encephalomalacia from the right frontal GBM as well as hypomagnesemia may have contributed to lowering seizure  threshold, seizures and eventual status epilepticus which was difficult to control and required intubation. 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 Was hooked up to LTM EEG-that has not shown any seizures but shows right-sided dysfunction which is to be expected. Will need reexamination and if the left side does not improve, will need repeat MRI to evaluate for worsening tumor versus stroke. We will follow. Plan was discussed with Dr. Wynona Neat and L. Nunzio Cory, PCCM Wife was updated at bedside plan was discussed with the bedside RN.  -- Milon Dikes, MD Neurologist [facility]  Additional 40 min cc time

## 2023-10-29 ENCOUNTER — Telehealth: Payer: Self-pay

## 2023-10-29 ENCOUNTER — Inpatient Hospital Stay (HOSPITAL_COMMUNITY): Payer: Medicare Other

## 2023-10-29 ENCOUNTER — Telehealth: Payer: Self-pay | Admitting: *Deleted

## 2023-10-29 ENCOUNTER — Encounter (HOSPITAL_COMMUNITY): Payer: Medicare Other

## 2023-10-29 ENCOUNTER — Encounter: Payer: Self-pay | Admitting: Internal Medicine

## 2023-10-29 DIAGNOSIS — G40901 Epilepsy, unspecified, not intractable, with status epilepticus: Secondary | ICD-10-CM | POA: Diagnosis not present

## 2023-10-29 LAB — CBC
HCT: 38 % — ABNORMAL LOW (ref 39.0–52.0)
Hemoglobin: 12.6 g/dL — ABNORMAL LOW (ref 13.0–17.0)
MCH: 30.1 pg (ref 26.0–34.0)
MCHC: 33.2 g/dL (ref 30.0–36.0)
MCV: 90.9 fL (ref 80.0–100.0)
Platelets: 212 10*3/uL (ref 150–400)
RBC: 4.18 MIL/uL — ABNORMAL LOW (ref 4.22–5.81)
RDW: 15.9 % — ABNORMAL HIGH (ref 11.5–15.5)
WBC: 11.5 10*3/uL — ABNORMAL HIGH (ref 4.0–10.5)
nRBC: 0.2 % (ref 0.0–0.2)

## 2023-10-29 LAB — GLUCOSE, CAPILLARY
Glucose-Capillary: 149 mg/dL — ABNORMAL HIGH (ref 70–99)
Glucose-Capillary: 164 mg/dL — ABNORMAL HIGH (ref 70–99)
Glucose-Capillary: 186 mg/dL — ABNORMAL HIGH (ref 70–99)
Glucose-Capillary: 233 mg/dL — ABNORMAL HIGH (ref 70–99)
Glucose-Capillary: 259 mg/dL — ABNORMAL HIGH (ref 70–99)
Glucose-Capillary: 316 mg/dL — ABNORMAL HIGH (ref 70–99)

## 2023-10-29 LAB — BASIC METABOLIC PANEL
Anion gap: 13 (ref 5–15)
BUN: 14 mg/dL (ref 8–23)
CO2: 22 mmol/L (ref 22–32)
Calcium: 7.9 mg/dL — ABNORMAL LOW (ref 8.9–10.3)
Chloride: 101 mmol/L (ref 98–111)
Creatinine, Ser: 1.15 mg/dL (ref 0.61–1.24)
GFR, Estimated: >60 mL/min (ref >60)
Glucose, Bld: 194 mg/dL — ABNORMAL HIGH (ref 70–99)
Potassium: 3.4 mmol/L — ABNORMAL LOW (ref 3.5–5.1)
Sodium: 136 mmol/L (ref 135–145)

## 2023-10-29 LAB — PHOSPHORUS
Phosphorus: 2.6 mg/dL (ref 2.5–4.6)
Phosphorus: 2 mg/dL — ABNORMAL LOW (ref 2.5–4.6)

## 2023-10-29 LAB — MAGNESIUM
Magnesium: 2 mg/dL (ref 1.7–2.4)
Magnesium: 1.7 mg/dL (ref 1.7–2.4)

## 2023-10-29 MED ORDER — ORAL CARE MOUTH RINSE
15.0000 mL | OROMUCOSAL | Status: DC | PRN
  Administered 2023-10-29 – 2023-10-30 (×2): 15 mL via OROMUCOSAL

## 2023-10-29 MED ORDER — DEXMEDETOMIDINE HCL IN NACL 400 MCG/100ML IV SOLN
0.0000 ug/kg/h | INTRAVENOUS | Status: DC
  Administered 2023-10-29: 0.4 ug/kg/h via INTRAVENOUS
  Administered 2023-10-30: 0.8 ug/kg/h via INTRAVENOUS
  Administered 2023-10-30: 1 ug/kg/h via INTRAVENOUS
  Filled 2023-10-29 (×4): qty 100

## 2023-10-29 MED ORDER — ORAL CARE MOUTH RINSE
15.0000 mL | OROMUCOSAL | Status: DC
  Administered 2023-10-29 – 2023-11-05 (×25): 15 mL via OROMUCOSAL

## 2023-10-29 NOTE — Procedures (Signed)
 Extubation Procedure Note  Patient Details:   Name: Alex Church DOB: 02-15-1946 MRN: [id number]   Airway Documentation:    Vent end date: 10/29/23 Vent end time: 0854   Evaluation  O2 sats: stable throughout Complications: No apparent complications Patient did tolerate procedure well. Bilateral Breath Sounds: Clear, Diminished   Yes  Pt extubated to 3L [place], tolerated well. Cuff leak present, no stridor noted, RN at bedside, RT will monitor as needed.   Thornell Mule 10/29/2023, 9:00 AM

## 2023-10-29 NOTE — Progress Notes (Signed)
 NAME:  Alex Church, MRN:  [id number], DOB:  04-24-1946, LOS: 2 ADMISSION DATE:  10/27/2023, CONSULTATION DATE: 10/27/2023 REFERRING MD: Dr. Particia Nearing, CHIEF COMPLAINT: Status epilepticus  History of Present Illness:  78 year old man who presented to [facility] 2/18 with witnessed seizure activity. PMHx significant for HTN, HLD, T2DM, B12 injections and glioblastoma (s/p craniotomy, XRT and chemotherapy, maintained on Temodar).   Patient is intubated and sedated; therefore, history is obtained primarily from chart review and from patient's wife (at bedside). Patient's wife states that they were at home this evening and she glanced over to see him slumped to the left side and unable to get up. While trying to assist him, she noted significant prolonged seizure-like activity prompting EMS. Patient has had one prior seizure related to his glioblastoma approximately 3 weeks postsurgery and is maintained on Keppra for ppx. At baseline, he sometimes requires some assistance getting up, but gets around well with a walker. Wife reports in the last month she has noticed some recurrence of symptoms similar to his initial glioblastoma presentation (I.e.L sided slumping/weakness). Wife denies recent sick contacts.   Labs were notable for WBC 8.4, Hgb 12.3, Plt 38.1; Na 132, K 4.6, CO2 18, Cr 1.4 (baseline 0.9-1.0). LFTs generally WNL. Mg 1.2. ABG 7.46/pCO2 26, Bicarb 18. UA : 50 glucose, small Hgb, ketones 20, protein 30, nitrite +, large leuks. CT Head showed decreased R anterior extra-axial collection.   PCCM consulted for ICU admission/transfer from [facility].  Pertinent  Medical History   Past Medical History:  Diagnosis Date   B12 deficiency    monthly shots   Diabetes mellitus    Glaucoma suspect    Glioblastoma (HCC)    Hyperlipemia    Hypertension      Significant Hospital Events: Including procedures, antibiotic start and stop dates in addition to other pertinent events   2/18 - Presented to [facility] with  seizure-like activity. Known history of glioblastoma. Intubated   Interim History / Subjective:  Sedation off.  Following commands.  Still very weak on the left side. No seizures on EEG.  Objective   Blood pressure (!) 142/98, pulse (!) 159, temperature 99.6 F (37.6 C), temperature source Axillary, resp. rate (!) 29, height 6' (1.829 m), weight 70.2 kg, SpO2 91%.    Vent Mode: PSV;CPAP FiO2 (%):  [30 %] 30 % Set Rate:  [14 bmp] 14 bmp Vt Set:  [580 mL] 580 mL PEEP:  [5 cmH20] 5 cmH20 Pressure Support:  [5 cmH20] 5 cmH20 Plateau Pressure:  [11 cmH20-13 cmH20] 13 cmH20   Intake/Output Summary (Last 24 hours) at 10/29/2023 1022 Last data filed at 10/29/2023 1000 Gross per 24 hour  Intake 1741.88 ml  Output 1200 ml  Net 541.88 ml   Filed Weights   10/27/23 2300 10/28/23 0500 10/29/23 0500  Weight: 74 kg 74 kg 70.2 kg    Examination: General: Acutely ill-appearing. HENT: Endotracheal tube in place, OG tube in place.  EEG leads in place. Lungs: Normal vesicular breath sounds bilaterally.  Tolerating SBT. Cardiovascular: Normal heart sounds.  Extremities warm. Abdomen: Soft with no distention. Extremities: No edema, no clubbing Neuro: Off sedation.  Periodically agitated.  Fighting against support devices.  Able to follow commands.  Flicker movement on left side. GU: Foley in place  Ancillary test personally reviewed:  Mild hypokalemia Mild leukocytosis.  Mild anemia of chronic disease. Urinalysis shows pyuria and bacteria Assessment & Plan:  Status epilepticus.  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  Will proceed with extubation. -Continue levetiracetam at current dose. 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  If still weak will consider MRI to rule out  tumor recurrence. -Precedex for post extubation agitation if necessary. -Empiric ceftriaxone for UTI pending culture results. -Sliding scale insulin  Best Practice (right click and "Reselect all SmartList Selections" daily)   Diet/type: tubefeeds-swallow evaluation postextubation DVT prophylaxis SCD-Lovenox Pressure ulcer(s): N/A GI prophylaxis: Not indicated postextubation Lines: N/A Foley:  N/A Code Status:  full code Last date of multidisciplinary goals of care discussion [discussed with spouse at bedside]  CRITICAL CARE Performed by: Lynnell Catalan   Total critical care time: 40 minutes  Critical care time was exclusive of separately billable procedures and treating other patients.  Critical care was necessary to treat or prevent imminent or life-threatening deterioration.  Critical care was time spent personally by me on the following activities: development of treatment plan with patient and/or surrogate as well as nursing, discussions with consultants, evaluation of patient's response to treatment, examination of patient, obtaining history from patient or surrogate, ordering and performing treatments and interventions, ordering and review of laboratory studies, ordering and review of radiographic studies, pulse oximetry, re-evaluation of patient's condition and participation in multidisciplinary rounds.  Lynnell Catalan, MD [facility] ICU Physician [facility] [place] [facility]  Pager: [phone] Mobile: [phone] After hours: [phone].

## 2023-10-29 NOTE — Telephone Encounter (Signed)
 PC to patient's wife [name] regarding MyChart message received stating he is in the hospital & asking when he should resume his temodar.  Per Dr Barbaraann Cao, patient should not resume temodar at all until he is discharged from the hospital & he has seen the patient in clinic.  Angela informed, she verbalizes understanding.  She will contact this office once the patient is discharged & will schedule a F/U appointment.

## 2023-10-29 NOTE — Progress Notes (Signed)
 SLP Cancellation Note  Patient Details Name: Alex Church MRN: [id number] DOB: 07/08/1946   Cancelled treatment:       Reason Eval/Treat Not Completed: Fatigue/lethargy limiting ability to participate. Pt extubated at 0900. Per RN, pt insufficiently arousable for evaluation at this time. Will continue efforts.   [name] B. Murvin Natal, [facility], CCC-SLP Speech Language Pathologist Office: [phone]  Leigh Aurora 10/29/2023, 11:53 AM

## 2023-10-29 NOTE — TOC CM/SW Note (Signed)
 Transition of Care [facility]) - Inpatient Brief Assessment   Patient Details  Name: Alex Church MRN: [id number] Date of Birth: [birth year]  Transition of Care [facility]) CM/SW Contact:    Mearl Latin, LCSW Phone Number: 10/29/2023, 2:24 PM   Clinical Narrative: Patient admitted from home with spouse. No current TOC needs identified but patient extubated today so please place consult if needs arise.    Transition of Care Asessment: Insurance and Status: Insurance coverage has been reviewed Patient has primary care physician: Yes Home environment has been reviewed: From home Prior level of function:: Independent Prior/Current Home Services: No current home services Social Drivers of Health Review: SDOH reviewed no interventions necessary Readmission risk has been reviewed: Yes Transition of care needs: no transition of care needs at this time

## 2023-10-29 NOTE — Progress Notes (Signed)
 NEUROLOGY CONSULT FOLLOW UP NOTE   Date of service: October 29, 2023 Patient Name: Alex Church MRN:  [id number] DOB:  1945/10/26  Interval Hx/subjective  Remains intubated Weaning of the ventilator but not following commands  Vitals   Vitals:   10/29/23 0633 10/29/23 0700 10/29/23 0800 10/29/23 0900  BP:  126/84 132/77 (!) 102/42  Pulse: (!) 115 (!) 121 (!) 124 (!) 148  Resp: 18 18 20 19   Temp:      TempSrc:      SpO2: 97% 97% 91% 92%  Weight:      Height:         Body mass index is 20.99 kg/m.  Physical Exam   General: Elderly man, in no acute distress, intubated, sedation held for exam HEENT: Normocephalic atraumatic Lungs: Vented Abdomen nondistended nontender Neurological exam Very drowsy Opens eyes to voice Does not follow commands Spontaneously moves right more than left Cranial nerves: Pupils equal round reactive to light, no gaze preference or deviation, breathing over the ventilator, cough and gag present, corneal reflexes present. Motor examination: Spontaneously moves the right upper extremity antigravity but does not follow commands to keep it up for 10 seconds but has decent strength.  To noxious stimulation, strong withdrawal of the right lower extremity.  There is minimal movement to noxious stimulation of the left upper extremity which is different than his baseline which is at least 4/5 strength on the left upper extremity.  Left lower extremity has no withdrawal to noxious stimulation. Sensation: As above   Medications  Current Facility-Administered Medications:    acetaminophen (TYLENOL) tablet 650 mg, 650 mg, Per Tube, Q4H PRN, Oretha Milch, MD   cefTRIAXone (ROCEPHIN) 2 g in sodium chloride 0.9 % 100 mL IVPB, 2 g, Intravenous, Q24H, Olalere, Adewale A, MD, Stopped at 10/28/23 1158   Chlorhexidine Gluconate Cloth 2 % PADS 6 each, 6 each, Topical, Daily, Oretha Milch, MD, 6 each at 10/28/23 6433   docusate (COLACE) 50 MG/5ML liquid 100 mg, 100  mg, Per Tube, BID, Cloyd Stagers M, PA-C, 100 mg at 10/28/23 2210   enoxaparin (LOVENOX) injection 40 mg, 40 mg, Subcutaneous, Q24H, Olalere, Adewale A, MD, 40 mg at 10/28/23 0933   feeding supplement (PROSource TF20) liquid 60 mL, 60 mL, Per Tube, Daily, Olalere, Adewale A, MD, 60 mL at 10/28/23 0932   feeding supplement (VITAL 1.5 CAL) liquid 1,000 mL, 1,000 mL, Per Tube, Continuous, Olalere, Adewale A, MD, Last Rate: 40 mL/hr at 10/29/23 0900, Infusion Verify at 10/29/23 0900   fentaNYL (SUBLIMAZE) bolus via infusion 25-100 mcg, 25-100 mcg, Intravenous, Q15 min PRN, Cloyd Stagers M, PA-C, 50 mcg at 10/29/23 2951   fentaNYL in NS (25mcg/ml) infusion-PREMIX, 25-200 mcg/hr, Intravenous, Continuous, Cloyd Stagers M, PA-C, Stopped at 10/29/23 8841   insulin aspart (novoLOG) injection 0-15 Units, 0-15 Units, Subcutaneous, Q4H, Calton Dach I, RPH, 3 Units at 10/29/23 6606   levETIRAcetam (KEPPRA) IVPB 1000 mg/100 mL premix, 1,000 mg, Intravenous, Q12H, Rejeana Brock, MD, Stopped at 10/29/23 [phone]   multivitamin with minerals tablet 1 tablet, 1 tablet, Per Tube, Daily, Olalere, Adewale A, MD, 1 tablet at 10/28/23 1128   mupirocin ointment (BACTROBAN) 2 % 1 Application, 1 Application, Nasal, BID, Oretha Milch, MD, 1 Application at 10/28/23 2214   norepinephrine (LEVOPHED) 4mg  in (0.016 mg/mL) premix infusion, 2-10 mcg/min, Intravenous, Titrated, Jacalyn Lefevre, MD, Last Rate: 11.25 mL/hr at 10/29/23 0900, 3 mcg/min at 10/29/23 0900   ondansetron (ZOFRAN) injection  4 mg, 4 mg, Intravenous, Q6H PRN, Tim Lair, PA-C   Oral care mouth rinse, 15 mL, Mouth Rinse, Q2H, Oretha Milch, MD, 15 mL at 10/29/23 0726   Oral care mouth rinse, 15 mL, Mouth Rinse, PRN, Oretha Milch, MD   pantoprazole (PROTONIX) injection 40 mg, 40 mg, Intravenous, QHS, Cloyd Stagers M, PA-C, 40 mg at 10/28/23 2208   polyethylene glycol (MIRALAX / GLYCOLAX) packet 17 g, 17 g, Per  Tube, Daily, Cloyd Stagers M, PA-C, 17 g at 10/28/23 0932   propofol (DIPRIVAN) 1000 MG/100ML infusion, 5-80 mcg/kg/min, Intravenous, Continuous, Jacalyn Lefevre, MD, Stopped at 10/29/23 [phone]   thiamine (VITAMIN B1) tablet 100 mg, 100 mg, Per Tube, Daily, Olalere, Adewale A, MD, 100 mg at 10/28/23 1128  Labs and Diagnostic Imaging   CBC:  Recent Labs  Lab 10/27/23 2025 10/28/23 0509 10/29/23 0819  WBC 8.4 8.8 11.5*  NEUTROABS 3.3  --   --   HGB 12.3* 11.4* 12.6*  HCT 38.1* 34.8* 38.0*  MCV 95.5 92.1 90.9  PLT 245 225 212    Basic Metabolic Panel:  Lab Results  Component Value Date   NA 136 10/29/2023   K 3.4 (L) 10/29/2023   CO2 22 10/29/2023   GLUCOSE 194 (H) 10/29/2023   BUN 14 10/29/2023   CREATININE 1.15 10/29/2023   CALCIUM 7.9 (L) 10/29/2023   GFRNONAA >60 10/29/2023   GFRAA 126 08/26/2007   Lipid Panel:  Lab Results  Component Value Date   LDLCALC 40 07/20/2023   HgbA1c:  Lab Results  Component Value Date   HGBA1C 6.8 (H) 07/20/2023   Urine Drug Screen:     Component Value Date/Time   LABOPIA NONE DETECTED 07/12/2023 0112   COCAINSCRNUR NONE DETECTED 07/12/2023 0112   LABBENZ NONE DETECTED 07/12/2023 0112   AMPHETMU NONE DETECTED 07/12/2023 0112   THCU NONE DETECTED 07/12/2023 0112   LABBARB NONE DETECTED 07/12/2023 0112    Alcohol Level     Component Value Date/Time   ETH <10 07/12/2023 0329   INR  Lab Results  Component Value Date   INR 1.1 05/06/2023   Imaging personally reviewed MRI brain 10/07/2023-postoperative changes in the right frontal lobe with unchanged and irregular enhancement about the resection but slightly increased surrounding T2 hyperintense signal, which could represent posttreatment change but is concerning for progressive nonenhancing infiltrative tumor.  Redemonstrated extra-axial collection subjacent to the cranioplasty, slightly decreased now measuring 7 mm-previously 11 mm.  An additional extra-axial collection  anterior to the right frontal lobe measures up to 8 mm-unchanged.   CT head on arrival Decreased size of right anterior extra-axial collection subjacent to craniotomy site.  Postoperative changes of the right frontal lobe with areas of encephalomalacia and gliosis.  Continuous EEG from overnight pending. Prior nights continuous EEG only with continuous generalized slowing and lateralized slowing to the right secondary to underlying structural abnormality.   Assessment   Alex Church is a 78 y.o. male past medical history of right frontal GBM s/p resection, presented with status epilepticus. Also had hypomagnesemia which might of lowered the seizure threshold in the setting of structural reason for seizure. On initial examination, was extremely weak on the left and continues to remain weak on the left-likely prolonged postictal state but will need further imaging to rule out tumor progression versus stroke. LTM EEG from overnight is pending but there was no clinical seizure-like activity noted.  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 Denese Killings ______________________________________________________________________   Signed, Milon Dikes, MD [facility]  CRITICAL CARE ATTESTATION Performed by: Milon Dikes, MD Total critical care time: 30 minutes Critical care time was exclusive of separately  billable procedures and treating other patients and/or supervising APPs/Residents/Students Critical care was necessary to treat or prevent imminent or life-threatening deterioration. This patient is critically ill and at significant risk for neurological worsening and/or death and care requires constant monitoring. Critical care was time spent personally by me on the following activities: development of treatment plan with patient and/or surrogate as well as nursing, discussions with consultants, evaluation of patient's response to treatment, examination of patient, obtaining history from patient or surrogate, ordering and performing treatments and interventions, ordering and review of laboratory studies, ordering and review of radiographic studies, pulse oximetry, re-evaluation of patient's condition, participation in multidisciplinary rounds and medical decision making of high complexity in the care of this patient.

## 2023-10-29 NOTE — Telephone Encounter (Signed)
 Notified the pt wife about the completed Disability forms. I emailed their copy to the email address that they requested.

## 2023-10-29 NOTE — Procedures (Signed)
 Patient Name: Alex Church  MRN: [id number]  Epilepsy Attending: Charlsie Quest  Referring Physician/Provider: Rejeana Brock, MD  Duration: 10/28/2023 2303 to 10/29/2023 2303   Patient history:  78 y.o. male with new onset status epilepticus. EEG to evaluate for seizure   Level of alertness:  lethargic   AEDs during EEG study: LEV, Propofol   Technical aspects: This EEG study was done with scalp electrodes positioned according to the 10-20 International system of electrode placement. Electrical activity was reviewed with band pass filter of 1-70Hz , sensitivity of 7 uV/mm, display speed of 28mm/sec with a 60Hz  notched filter applied as appropriate. EEG data were recorded continuously and digitally stored.  Video monitoring was available and reviewed as appropriate.   Description: EEG showed continuous generalized and lateralized right hemisphere 3 to 6 Hz theta-delta slowing admixed with 15 to 18 Hz beta activity distributed symmetrically and diffusely. Hyperventilation and photic stimulation were not performed.      ABNORMALITY - Continuous slow, generalized and lateralized right hemisphere   IMPRESSION: This study is suggestive of cortical dysfunction arising from right hemisphere likely secondary to underlying structural abnormality. Additionally there is severe diffuse encephalopathy likely related to sedation. No seizures or epileptiform discharges were seen throughout the recording.   Mariem Skolnick [name]

## 2023-10-30 ENCOUNTER — Inpatient Hospital Stay (HOSPITAL_COMMUNITY): Payer: Medicare Other

## 2023-10-30 ENCOUNTER — Encounter (HOSPITAL_COMMUNITY): Payer: Medicare Other

## 2023-10-30 DIAGNOSIS — G40901 Epilepsy, unspecified, not intractable, with status epilepticus: Secondary | ICD-10-CM | POA: Diagnosis not present

## 2023-10-30 LAB — BASIC METABOLIC PANEL
Anion gap: 14 (ref 5–15)
BUN: 17 mg/dL (ref 8–23)
CO2: 25 mmol/L (ref 22–32)
Calcium: 8.4 mg/dL — ABNORMAL LOW (ref 8.9–10.3)
Chloride: 100 mmol/L (ref 98–111)
Creatinine, Ser: 0.75 mg/dL (ref 0.61–1.24)
GFR, Estimated: >60 mL/min (ref >60)
Glucose, Bld: 172 mg/dL — ABNORMAL HIGH (ref 70–99)
Potassium: 3.1 mmol/L — ABNORMAL LOW (ref 3.5–5.1)
Sodium: 139 mmol/L (ref 135–145)

## 2023-10-30 LAB — GLUCOSE, CAPILLARY
Glucose-Capillary: 170 mg/dL — ABNORMAL HIGH (ref 70–99)
Glucose-Capillary: 146 mg/dL — ABNORMAL HIGH (ref 70–99)
Glucose-Capillary: 148 mg/dL — ABNORMAL HIGH (ref 70–99)
Glucose-Capillary: 149 mg/dL — ABNORMAL HIGH (ref 70–99)
Glucose-Capillary: 152 mg/dL — ABNORMAL HIGH (ref 70–99)

## 2023-10-30 LAB — URINE CULTURE: Culture: >=100000 — AB

## 2023-10-30 MED ORDER — MIDAZOLAM HCL 2 MG/2ML IJ SOLN
2.0000 mg | Freq: Once | INTRAMUSCULAR | Status: AC
  Administered 2023-10-30: 2 mg via INTRAVENOUS

## 2023-10-30 MED ORDER — POLYETHYLENE GLYCOL 3350 17 G PO PACK
17.0000 g | PACK | Freq: Every day | ORAL | Status: DC
  Administered 2023-11-03 – 2023-11-05 (×3): 17 g via ORAL
  Filled 2023-10-30 (×5): qty 1

## 2023-10-30 MED ORDER — ACETAMINOPHEN 325 MG PO TABS
650.0000 mg | ORAL_TABLET | ORAL | Status: DC | PRN
  Filled 2023-10-30: qty 2

## 2023-10-30 MED ORDER — DOCUSATE SODIUM 50 MG/5ML PO LIQD
100.0000 mg | Freq: Two times a day (BID) | ORAL | Status: DC
  Administered 2023-10-30 – 2023-11-05 (×11): 100 mg via ORAL
  Filled 2023-10-30 (×12): qty 10

## 2023-10-30 MED ORDER — THIAMINE MONONITRATE 100 MG PO TABS
100.0000 mg | ORAL_TABLET | Freq: Every day | ORAL | Status: AC
Start: 2023-10-31 — End: 2023-11-03
  Administered 2023-10-31 – 2023-11-03 (×4): 100 mg via ORAL
  Filled 2023-10-30 (×4): qty 1

## 2023-10-30 MED ORDER — GADOBUTROL 1 MMOL/ML IV SOLN
7.5000 mL | Freq: Once | INTRAVENOUS | Status: AC | PRN
Start: 2023-10-30 — End: 2023-10-30
  Administered 2023-10-30: 7.5 mL via INTRAVENOUS

## 2023-10-30 MED ORDER — POTASSIUM CHLORIDE 10 MEQ/100ML IV SOLN
10.0000 meq | INTRAVENOUS | Status: AC
  Administered 2023-10-30 (×6): 10 meq via INTRAVENOUS
  Filled 2023-10-30 (×6): qty 100

## 2023-10-30 MED ORDER — ADULT MULTIVITAMIN W/MINERALS CH
1.0000 | ORAL_TABLET | Freq: Every day | ORAL | Status: DC
  Administered 2023-10-31 – 2023-11-05 (×6): 1 via ORAL
  Filled 2023-10-30 (×6): qty 1

## 2023-10-30 MED ORDER — SODIUM CHLORIDE 0.9 % IV SOLN
1.0000 g | Freq: Three times a day (TID) | INTRAVENOUS | Status: DC
  Administered 2023-10-30 – 2023-11-05 (×19): 1 g via INTRAVENOUS
  Filled 2023-10-30 (×24): qty 1

## 2023-10-30 MED ORDER — SODIUM CHLORIDE 0.9 % IV SOLN: INTRAVENOUS | Status: AC | PRN

## 2023-10-30 MED ORDER — MIDAZOLAM HCL 2 MG/2ML IJ SOLN
INTRAMUSCULAR | Status: AC
Start: 2023-10-30 — End: ?
  Filled 2023-10-30: qty 2

## 2023-10-30 MED ORDER — ENSURE ENLIVE PO LIQD
237.0000 mL | Freq: Three times a day (TID) | ORAL | Status: DC
  Administered 2023-11-02 – 2023-11-04 (×5): 237 mL via ORAL

## 2023-10-30 NOTE — Evaluation (Signed)
 Occupational Therapy Evaluation Patient Details Name: Alex Church MRN: [id number] DOB: 1946/02/14 Today's Date: 10/30/2023   History of Present Illness   78 year old man who presented to [facility]/18 with witnessed seizure activity. ETT 2/18-2/20. PMHx significant for HTN, HLD, T2DM, B12 injections and glioblastoma (s/p craniotomy, XRT and chemotherapy, maintained on Temodar).     Clinical Impressions Patient admitted for the diagnosis above.  PTA he was living at home, spouse stating increasing falls and assist at home with ADL and mobility.  Patient had just started [facility], but had only one visit to date.  Unfortunately the patient is requiring too much physical assist for ADL care and mobility to transition directly home.  Patient will benefit from continued inpatient follow up therapy, <3 hours/day.  OT will continue to follow in the acute setting to address deficits.       If plan is discharge home, recommend the following:   Assist for transportation;Assistance with feeding;A lot of help with bathing/dressing/bathroom;Two people to help with walking and/or transfers;Help with stairs or ramp for entrance     Functional Status Assessment         Equipment Recommendations   None recommended by OT     Recommendations for Other Services         Precautions/Restrictions   Precautions Precautions: Fall Recall of Precautions/Restrictions: Impaired Restrictions Weight Bearing Restrictions Per Provider Order: No     Mobility Bed Mobility Overal bed mobility: Needs Assistance Bed Mobility: Supine to Sit, Sit to Supine     Supine to sit: Mod assist, +2 for physical assistance Sit to supine: Mod assist, +2 for physical assistance        Transfers Overall transfer level: Needs assistance Equipment used: 2 person hand held assist Transfers: Sit to/from Stand, Bed to chair/wheelchair/BSC Sit to Stand: Min assist, +2 physical assistance     Step pivot transfers:  Mod assist, +2 physical assistance            Balance Overall balance assessment: Needs assistance Sitting-balance support: No upper extremity supported, Feet supported Sitting balance-Leahy Scale: Poor Sitting balance - Comments: heavy R lateral bias, requires truncal assist to correct Postural control: Right lateral lean Standing balance support: Bilateral upper extremity supported, During functional activity Standing balance-Leahy Scale: Poor                             ADL either performed or assessed with clinical judgement   ADL Overall ADL's : Needs assistance/impaired Eating/Feeding: Moderate assistance;Bed level   Grooming: Wash/dry hands;Wash/dry face;Moderate assistance;Sitting   Upper Body Bathing: Maximal assistance;Sitting;Bed level   Lower Body Bathing: Total assistance;Bed level   Upper Body Dressing : Maximal assistance;Sitting;Bed level   Lower Body Dressing: Total assistance;Bed level   Toilet Transfer: Maximal assistance;+2 for safety/equipment;Stand-pivot;BSC/3in1   Toileting- Clothing Manipulation and Hygiene: Total assistance               Vision   Vision Assessment?: No apparent visual deficits     Perception Perception: Not tested       Praxis Praxis: Not tested       Pertinent Vitals/Pain Pain Assessment Pain Assessment: Faces Faces Pain Scale: Hurts a little bit Pain Location: burning at IV site Pain Descriptors / Indicators: Burning Pain Intervention(s): Monitored during session     Extremity/Trunk Assessment Upper Extremity Assessment Upper Extremity Assessment: Generalized weakness;Right hand dominant;RUE deficits/detail;LUE deficits/detail RUE Deficits / Details: 3+5 MMT grossly RUE Sensation:  WNL RUE Coordination: WNL LUE Deficits / Details: 3/5 MMT LUE Sensation: WNL LUE Coordination: WNL   Lower Extremity Assessment Lower Extremity Assessment: Defer to PT evaluation   Cervical / Trunk  Assessment Cervical / Trunk Assessment: Kyphotic   Communication Communication Communication: Impaired Factors Affecting Communication: Difficulty expressing self;Reduced clarity of speech   Cognition Arousal: Alert Behavior During Therapy: Flat affect, Anxious, Impulsive Cognition: No apparent impairments                               Following commands: Intact       Cueing  General Comments   Cueing Techniques: Verbal cues;Gestural cues      Exercises     Shoulder Instructions      Home Living Family/patient expects to be discharged to:: Private residence Living Arrangements: Spouse/significant other Available Help at Discharge: Family;Available 24 hours/day Type of Home: House Home Access: Stairs to enter [employer] of Steps: 3 Entrance Stairs-Rails: None Home Layout: Two level Alternate Level Stairs-Number of Steps: 12 (has a stair lift) Alternate Level Stairs-Rails: Can reach both Bathroom Shower/Tub: Producer, television/film/video: Standard Bathroom Accessibility: No   Home Equipment: Agricultural consultant (2 wheels);Cane - single point;Rollator (4 wheels)          Prior Functioning/Environment Prior Level of Function : Needs assist             Mobility Comments: using RW, multiple falls ADLs Comments: assist with dressing, bathing, cooking, cleaning as of recently; pt's wife states he was more independent after finishing AIR and OPPT but then started falling    OT Problem List: Decreased strength;Decreased range of motion;Decreased activity tolerance;Impaired balance (sitting and/or standing);Decreased knowledge of use of DME or AE;Decreased safety awareness;Pain   OT Treatment/Interventions: Self-care/ADL training;Therapeutic activities;DME and/or AE instruction;Patient/family education;Balance training      OT Goals(Current goals can be found in the care plan section)   Acute Rehab OT Goals Patient Stated Goal: Return  home OT Goal Formulation: With patient Time For Goal Achievement: 11/13/23 Potential to Achieve Goals: Fair ADL Goals Pt Will Perform Eating: with min assist;sitting Pt Will Perform Grooming: with min assist;sitting Pt Will Perform Upper Body Bathing: with min assist Pt Will Perform Upper Body Dressing: with mod assist;sitting Pt Will Transfer to Toilet: with mod assist;stand pivot transfer;bedside commode Pt/caregiver will Perform Home Exercise Program: Increased strength;Both right and left upper extremity;With minimal assist   OT Frequency:  Min 1X/week    Co-evaluation PT/OT/SLP Co-Evaluation/Treatment: Yes Reason for Co-Treatment: For patient/therapist safety;Complexity of the patient's impairments (multi-system involvement);To address functional/ADL transfers PT goals addressed during session: Mobility/safety with mobility;Balance OT goals addressed during session: ADL's and self-care      AM-PAC OT "6 Clicks" Daily Activity     Outcome Measure Help from another person eating meals?: A Lot Help from another person taking care of personal grooming?: A Lot Help from another person toileting, which includes using toliet, bedpan, or urinal?: A Lot Help from another person bathing (including washing, rinsing, drying)?: A Lot Help from another person to put on and taking off regular upper body clothing?: A Lot Help from another person to put on and taking off regular lower body clothing?: Total 6 Click Score: 11   End of Session Equipment Utilized During Treatment: Rolling walker (2 wheels) Nurse Communication: Mobility status  Activity Tolerance: Patient limited by fatigue Patient left: in bed;with call bell/phone within reach;with bed alarm  set  OT Visit Diagnosis: Unsteadiness on feet (R26.81);Muscle weakness (generalized) (M62.81);History of falling (Z91.81);Pain Pain - Right/Left: Right Pain - part of body: Arm                Time: 4098-1191 OT Time Calculation (min):  22 min Charges:  OT General Charges $OT Visit: 1 Visit OT Evaluation $OT Eval Moderate Complexity: 1 Mod  10/30/2023  RP, OTR/L  Acute Rehabilitation Services  Office:  [phone]   Suzanna Obey 10/30/2023, 1:07 PM

## 2023-10-30 NOTE — Progress Notes (Signed)
 Nutrition Follow-up  DOCUMENTATION CODES:   Non-severe (moderate) malnutrition in context of chronic illness  INTERVENTION:   Encourage PO intake of full liquid diet  Ensure Enlive po BID, each supplement provides 350 kcal and 20 grams of protein.  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 Ongoing.   GOAL:   Patient will meet greater than or equal to 90% of their needs Progressing.   MONITOR:   Labs, Skin, PO intake, Supplement acceptance  REASON FOR ASSESSMENT:   Consult Enteral/tube feeding initiation and management  ASSESSMENT:   Pt with PMH of glioblastoma, DM, HLD, HTN, and B12 deficiency on monthly injections admitted with new onset status epilepticus.   Pt discussed during ICU rounds and with RN and MD. Per MD Todd's paralysis make take time to completely resolve. Followed by PT/OT.  Pt s/p MBS able to start very restricted diet   2/18 - admitted, intubated 2/20 - extubated 2/21 - diet advanced to nectar thickened full liquids  Medications reviewed and include: colace, SSI every 4 hours, MVI with minerals, miralax, thiamine  Fentanyl  Keppra Mag sulfate x 1  Levophed @ 3 mcg   Labs reviewed:  K 3.1 Phos 2.0 (2/20) CBG's: 146-170   18 F OG tube; per xray tip and side port overlie proximal stomach   NUTRITION - FOCUSED PHYSICAL EXAM:  Flowsheet Row Most Recent Value  Orbital Region Moderate depletion  Upper Arm Region Moderate depletion  Thoracic and Lumbar Region Moderate depletion  Buccal Region Unable to assess  Temple Region Moderate depletion  Clavicle Bone Region Moderate depletion  Clavicle and Acromion Bone Region Severe depletion  Scapular Bone Region Severe depletion  Dorsal Hand Unable to assess  Patellar Region Moderate depletion  Anterior  Thigh Region Moderate depletion  Posterior Calf Region Moderate depletion  Edema (RD Assessment) Mild  Hair Reviewed  Eyes Unable to assess  Mouth Unable to assess  Skin Reviewed  Nails Unable to assess       Diet Order:   Diet Order             Diet full liquid Room service appropriate? No; Fluid consistency: Nectar Thick  Diet effective now                   EDUCATION NEEDS:   Not appropriate for education at this time  Skin:  Skin Assessment: Skin Integrity Issues: Skin Integrity Issues:: Stage II Stage II: buttocks  Last BM:  unknown  Height:   Ht Readings from Last 1 Encounters:  10/28/23 6' (1.829 m)    Weight:   Wt Readings from Last 1 Encounters:  10/30/23 73.1 kg    BMI:  Body mass index is 21.86 kg/m.  Estimated Nutritional Needs:   Kcal:  2100-2300  Protein:  110-125 grams  Fluid:  >2 L/day  Cammy Copa., RD, LDN, CNSC See AMiON for contact information

## 2023-10-30 NOTE — Progress Notes (Signed)
 NAME:  Alex Church, MRN:  [id number], DOB:  11/26/1945, LOS: 3 ADMISSION DATE:  10/27/2023, CONSULTATION DATE: 10/27/2023 REFERRING MD: Dr. Particia Nearing, CHIEF COMPLAINT: Status epilepticus  History of Present Illness:  78 year old man who presented to [facility] 2/18 with witnessed seizure activity. PMHx significant for HTN, HLD, T2DM, B12 injections and glioblastoma (s/p craniotomy, XRT and chemotherapy, maintained on Temodar).   Patient is intubated and sedated; therefore, history is obtained primarily from chart review and from patient's wife (at bedside). Patient's wife states that they were at home this evening and she glanced over to see him slumped to the left side and unable to get up. While trying to assist him, she noted significant prolonged seizure-like activity prompting EMS. Patient has had one prior seizure related to his glioblastoma approximately 3 weeks postsurgery and is maintained on Keppra for ppx. At baseline, he sometimes requires some assistance getting up, but gets around well with a walker. Wife reports in the last month she has noticed some recurrence of symptoms similar to his initial glioblastoma presentation (I.e.L sided slumping/weakness). Wife denies recent sick contacts.   Labs were notable for WBC 8.4, Hgb 12.3, Plt 38.1; Na 132, K 4.6, CO2 18, Cr 1.4 (baseline 0.9-1.0). LFTs generally WNL. Mg 1.2. ABG 7.46/pCO2 26, Bicarb 18. UA : 50 glucose, small Hgb, ketones 20, protein 30, nitrite +, large leuks. CT Head showed decreased R anterior extra-axial collection.   PCCM consulted for ICU admission/transfer from [facility].  Pertinent  Medical History   Past Medical History:  Diagnosis Date   B12 deficiency    monthly shots   Diabetes mellitus    Glaucoma suspect    Glioblastoma (HCC)    Hyperlipemia    Hypertension      Significant Hospital Events: Including procedures, antibiotic start and stop dates in addition to other pertinent events   2/18 - Presented to [facility] with  seizure-like activity. Known history of glioblastoma. Intubated  2/20-no further seizures on EEG.  Patient successfully extubated.  Did require Precedex for agitation thereafter but has settled down.  Interim History / Subjective:  Periodic agitation overnight.  Transiently on Precedex.  Now off.  Still some confusion.  Objective   Blood pressure 131/75, pulse (!) 113, temperature 98.5 F (36.9 C), temperature source Oral, resp. rate (!) 21, height 6' (1.829 m), weight 73.1 kg, SpO2 95%.        Intake/Output Summary (Last 24 hours) at 10/30/2023 1648 Last data filed at 10/30/2023 1600 Gross per 24 hour  Intake 1393.9 ml  Output 955 ml  Net 438.9 ml   Filed Weights   10/28/23 0500 10/29/23 0500 10/30/23 0500  Weight: 74 kg 70.2 kg 73.1 kg    Examination: General: Appears chronically unwell.  Lying in bed. HENT: Mucous membranes moist.  No pressure injury. Lungs: Normal vesicular breath sounds bilaterally.   Cardiovascular: Normal heart sounds.  Extremities warm. Abdomen: Soft with no distention. Extremities: No edema, no clubbing Neuro: Awake.  Oriented to person place and reason for admission.  Speaks in short sentences with a soft voice.  Left-sided weakness has improved.  Still periods of confusion. GU: Foley in place  Ancillary test personally reviewed:  Mild hypokalemia Mild leukocytosis.  Mild anemia of chronic disease. Urinalysis shows pyuria and bacteria.  Pseudomonas in urine. MRI appears similar to prior study.  Assessment & Plan:  Status epilepticus.  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 -Continue levetiracetam at current dose.  Neurology is following. 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  PT OT ordered. -Complete 7 days of ceftazidime for Pseudomonas  UTI. -Sliding scale insulin  Best Practice (right click and "Reselect all SmartList Selections" daily)   Diet/type: 4 feeds today. DVT prophylaxis SCD-Lovenox Pressure ulcer(s): N/A GI prophylaxis: Not indicated postextubation Lines: N/A Foley:  N/A Code Status:  full code Last date of multidisciplinary goals of care discussion [discussed with spouse at bedside]   Lynnell Catalan, MD [facility] ICU Physician [facility]  Pager: [phone] Mobile: [phone] After hours: [phone].

## 2023-10-30 NOTE — Procedures (Signed)
 Patient Name: Alex Church  MRN: [id number]  Epilepsy Attending: Charlsie Quest  Referring Physician/Provider: Rejeana Brock, MD  Duration: 10/29/2023 2303 to 10/30/2023 4098   Patient history:  78 y.o. male with new onset status epilepticus. EEG to evaluate for seizure   Level of alertness:  lethargic   AEDs during EEG study: LEV   Technical aspects: This EEG study was done with scalp electrodes positioned according to the 10-20 International system of electrode placement. Electrical activity was reviewed with band pass filter of 1-70Hz , sensitivity of 7 uV/mm, display speed of 21mm/sec with a 60Hz  notched filter applied as appropriate. EEG data were recorded continuously and digitally stored.  Video monitoring was available and reviewed as appropriate.   Description: EEG showed continuous generalized and lateralized right hemisphere 3 to 6 Hz theta-delta slowing admixed with 15 to 18 Hz beta activity distributed symmetrically and diffusely. Hyperventilation and photic stimulation were not performed.      ABNORMALITY - Continuous slow, generalized and lateralized right hemisphere   IMPRESSION: This study is suggestive of cortical dysfunction arising from right hemisphere likely secondary to underlying structural abnormality. Additionally there is severe diffuse encephalopathy likely related to sedation. No seizures or epileptiform discharges were seen throughout the recording.   Adeyemi Hamad [name]

## 2023-10-30 NOTE — Evaluation (Signed)
 Modified Barium Swallow Study  Patient Details  Name: Alex Church MRN: [id number] Date of Birth: February 18, 1946  Today's Date: 10/30/2023  Modified Barium Swallow completed.  Full report located under Chart Review in the Imaging Section.  History of Present Illness 78 year old man who presented to [facility] 2/18 with witnessed seizure activity. MRI pending. ETT 2/18-2/20. PMHx significant for HTN, HLD, T2DM, B12 injections and glioblastoma (s/p craniotomy, XRT and chemotherapy, maintained on Temodar). Wife reports pain in chest after swallowing that they attributed to reflux.   Clinical Impression Pt has an oropharyngeal dysphagia with oral phase marked by slow mastication, anterior spillage of thin liquids primarily, and diffuse oral residue that he does reduce spontaneously with subsequent swallows. Pharyngeally he has reduced hyolaryngeal movement, diminished pharyngeal stripping wave, and reduced base of tongue retraction. He has almost no epiglottic inversion with thinner consistencies. He has residue throughout his pharynx with all consistencies, although it does increase as boluses become thicker. Penetration to the vocal folds and even trace amounts of aspiration (PAS 8) occur with thin liquids. Penetration is generally more transient with nectar thick liquids via cup sips, and can be cleared with a cued throat clear when it doesn't clear on its own. Penetration reaches the vocal folds when trying to introduce a chin tuck. Although there is no penetration with honey thick liquids, there is also more pharyngeal residue. Recommend starting with a full liquid diet thickened to nectar thick liquids, consumed via cup. Note that throughout the study, pt was also quite impulsive at times, and would benefit from full supervision to slow his pacing and adhere to strategies listed below.   DIGEST Swallow Severity Rating*  Safety: 2  Efficiency: 1  Overall Pharyngeal Swallow Severity: 2 1: mild; 2: moderate;  3: severe; 4: profound  *The Dynamic Imaging Grade of Swallowing Toxicity is standardized for the head and neck cancer population, however, demonstrates promising clinical applications across populations to standardize the clinical rating of pharyngeal swallow safety and severity.  Factors that may increase risk of adverse event in presence of aspiration [employer] Clear[employer] Coots 2021): Limited mobility;Frail or deconditioned;Weak cough;Poor general health and/or compromised immunity;Respiratory or GI disease  Swallow Evaluation Recommendations Recommendations: PO diet PO Diet Recommendation: Full liquid diet;Mildly thick liquids (Level 2, nectar thick) Liquid Administration via: Cup;No straw Medication Administration: Crushed with puree Supervision: Staff to assist with self-feeding;Full assist for feeding Swallowing strategies  : Minimize environmental distractions;Slow rate;Small bites/sips;Multiple dry swallows after each bite/sip;Clear throat intermittently Postural changes: Position pt fully upright for meals;Stay upright 30-60 min after meals Oral care recommendations: Oral care BID (2x/day) Caregiver Recommendations: Avoid jello, ice cream, thin soups, popsicles;Remove water pitcher;Have oral suction available      Mahala Menghini., M.A. CCC-SLP Acute Rehabilitation Services Office [phone]  Secure chat preferred  10/30/2023,3:57 PM

## 2023-10-30 NOTE — Evaluation (Signed)
 Physical Therapy Evaluation Patient Details Name: Alex Church MRN: [id number] DOB: 1946-07-31 Today's Date: 10/30/2023  History of Present Illness  78 year old man who presented to [facility] 2/18 with witnessed seizure activity. ETT 2/18-2/20. PMHx significant for HTN, HLD, T2DM, B12 injections and glioblastoma (s/p craniotomy, XRT and chemotherapy, maintained on Temodar).  Clinical Impression   Pt presents with LE weakness L>R, impaired sitting and standing balance with heavy R lateral bias (per family this was more of a L bias at home), poor activity tolerance, and difficulty mobilizing. Pt to benefit from acute PT to address deficits. Pt sat EOB x10 minutes, requires significant assist for correcting R lateral bias, and stood EOB x2. Pt overall requiring mod +2 assist for transfer-level mobility. Patient will benefit from continued inpatient follow up therapy, <3 hours/day. PT to progress mobility as tolerated, and will continue to follow acutely.          If plan is discharge home, recommend the following: A lot of help with walking and/or transfers;A lot of help with bathing/dressing/bathroom   Can travel by private vehicle        Equipment Recommendations None recommended by PT  Recommendations for Other Services       Functional Status Assessment Patient has had a recent decline in their functional status and demonstrates the ability to make significant improvements in function in a reasonable and predictable amount of time.     Precautions / Restrictions Precautions Precautions: Fall Recall of Precautions/Restrictions: Impaired Restrictions Weight Bearing Restrictions Per Provider Order: No      Mobility  Bed Mobility Overal bed mobility: Needs Assistance Bed Mobility: Supine to Sit, Sit to Supine     Supine to sit: Mod assist, +2 for physical assistance Sit to supine: Mod assist, +2 for physical assistance   General bed mobility comments: assist for trunk and LE  management, scooting to/from EOB.    Transfers Overall transfer level: Needs assistance Equipment used: 2 person hand held assist Transfers: Sit to/from Stand, Bed to chair/wheelchair/BSC Sit to Stand: Min assist, +2 physical assistance   Step pivot transfers: Mod assist, +2 physical assistance       General transfer comment: assist for power up, rise, steady, and lateral stepping towards HOB. Pt with heavy R lateral bias and worsening with fatigue    Ambulation/Gait                  Stairs            Wheelchair Mobility     Tilt Bed    Modified Rankin (Stroke Patients Only)       Balance Overall balance assessment: Needs assistance Sitting-balance support: No upper extremity supported, Feet supported Sitting balance-Leahy Scale: Poor Sitting balance - Comments: heavy R lateral bias, requires truncal assist to correct Postural control: Right lateral lean Standing balance support: Bilateral upper extremity supported, During functional activity Standing balance-Leahy Scale: Poor                               Pertinent Vitals/Pain Pain Assessment Pain Assessment: Faces Faces Pain Scale: Hurts a little bit Pain Location: burning at IV site Pain Descriptors / Indicators: Burning Pain Intervention(s): Limited activity within patient's tolerance, Monitored during session, Repositioned    Home Living Family/patient expects to be discharged to:: Private residence Living Arrangements: Spouse/significant other Available Help at Discharge: Family;Available 24 hours/day Type of Home: House Home Access: Stairs to enter Entrance Stairs-Rails:  None Entrance Stairs-Number of Steps: 3 Alternate Level Stairs-Number of Steps: 12 (has a stair lift) Home Layout: Two level Home Equipment: Agricultural consultant (2 wheels);Cane - single point;Rollator (4 wheels)      Prior Function Prior Level of Function : Needs assist             Mobility Comments:  using RW, multiple falls ADLs Comments: assist with dressing, bathing, cooking, cleaning as of recently; pt's wife states he was more independent after finishing AIR and OPPT but then started falling     Extremity/Trunk Assessment   Upper Extremity Assessment Upper Extremity Assessment: Defer to OT evaluation    Lower Extremity Assessment Lower Extremity Assessment: Generalized weakness (L weaker vs R during functional mobility)    Cervical / Trunk Assessment Cervical / Trunk Assessment: Kyphotic  Communication        Cognition Arousal: Alert Behavior During Therapy: Restless   PT - Cognitive impairments: Attention, Sequencing, Problem solving, Safety/Judgement                                 Cueing       General Comments General comments (skin integrity, edema, etc.): vss on ra    Exercises     Assessment/Plan    PT Assessment Patient needs continued PT services  PT Problem List Decreased strength;Decreased mobility;Decreased safety awareness;Decreased balance;Decreased knowledge of use of DME;Pain;Decreased activity tolerance;Decreased coordination;Decreased cognition       PT Treatment Interventions DME instruction;Therapeutic activities;Gait training;Therapeutic exercise;Patient/family education;Balance training;Neuromuscular re-education;Functional mobility training    PT Goals (Current goals can be found in the Care Plan section)  Acute Rehab PT Goals PT Goal Formulation: With patient Time For Goal Achievement: 11/13/23 Potential to Achieve Goals: Good    Frequency Min 1X/week     Co-evaluation PT/OT/SLP Co-Evaluation/Treatment: Yes Reason for Co-Treatment: For patient/therapist safety;Complexity of the patient's impairments (multi-system involvement);To address functional/ADL transfers PT goals addressed during session: Mobility/safety with mobility;Balance         AM-PAC PT "6 Clicks" Mobility  Outcome Measure Help needed turning  from your back to your side while in a flat bed without using bedrails?: A Little Help needed moving from lying on your back to sitting on the side of a flat bed without using bedrails?: A Lot Help needed moving to and from a bed to a chair (including a wheelchair)?: A Lot Help needed standing up from a chair using your arms (e.g., wheelchair or bedside chair)?: A Lot Help needed to walk in hospital room?: Total Help needed climbing 3-5 steps with a railing? : Total 6 Click Score: 11    End of Session   Activity Tolerance: Patient tolerated treatment well;Patient limited by fatigue Patient left: in bed;with call bell/phone within reach;with bed alarm set Nurse Communication: Mobility status PT Visit Diagnosis: Other abnormalities of gait and mobility (R26.89);Muscle weakness (generalized) (M62.81)    Time: 9147-8295 PT Time Calculation (min) (ACUTE ONLY): 27 min   Charges:   PT Evaluation $PT Eval Moderate Complexity: 1 Mod   PT General Charges $$ ACUTE PT VISIT: 1 Visit         Marye Round, PT DPT Acute Rehabilitation Services Secure Chat Preferred  Office [phone]   Marsean Elkhatib Sheliah Plane 10/30/2023, 12:52 PM

## 2023-10-30 NOTE — Progress Notes (Signed)
 LTM EEG D/C'd. Patient had no skin break down noted. [facility] notified.

## 2023-10-30 NOTE — Progress Notes (Signed)
 Received patient from [facility] ICU, placed on heart monitor, and vitals taken. Call bell with in reach   Sahasra Belue, Kae Heller, RN

## 2023-10-30 NOTE — Progress Notes (Signed)
 [facility] ADULT ICU REPLACEMENT PROTOCOL   The patient does apply for the [facility] Adult ICU Electrolyte Replacment Protocol based on the criteria listed below:   1.Exclusion criteria: TCTS, ECMO, Dialysis, and Myasthenia Gravis patients 2. Is GFR >/= 30 ml/min? Yes.    Patient's GFR today is >60 3. Is SCr </= 2? Yes.   Patient's SCr is 0.75 mg/dL 4. Did SCr increase >/= 0.5 in 24 hours? No. 5.Pt's weight >40kg  Yes.   6. Abnormal electrolyte(s):   K 3.1  7. Electrolytes replaced per protocol 8.  Call MD STAT for K+ </= 2.5, Phos </= 1, or Mag </= 1 Physician:  Shawn Stall R Dacie Mandel 10/30/2023 6:18 AM

## 2023-10-30 NOTE — Progress Notes (Signed)
 LTM maint complete - no skin breakdown seen  Serviced [address]  [facility] monitored, Event button test confirmed by [facility].

## 2023-10-30 NOTE — Progress Notes (Addendum)
 NEUROLOGY CONSULT FOLLOW UP NOTE   Date of service: October 30, 2023 Patient Name: Alex Church MRN:  [id number] DOB:  08/13/1946  Interval Hx/subjective  Seen and examined.  Extubated yesterday.  Significant improvement in left-sided strength today  Vitals   Vitals:   10/30/23 0500 10/30/23 0600 10/30/23 0700 10/30/23 0800  BP: 117/68 (!) 115/56 106/60 112/67  Pulse: 84 74 76 74  Resp: 17 18 20 16   Temp:    97.6 F (36.4 C)  TempSrc:    Oral  SpO2: 96% 97% 94% 99%  Weight: 73.1 kg     Height:         Body mass index is 21.86 kg/m.  Physical Exam  General: Elderly sick looking man, no acute distress, extubated HEENT: EEG leads in place Lungs: Scattered rales Abdomen nondistended nontender Neurological exam Sleepy, opens eyes to voice. Able to tell me his name Unable to tell me the month Follows commands Cranial nerves: Pupils equal round reactive light, extraocular movements appear unhindered, visual fields appear full, subtle left nasolabial fold flattening, tongue and palate midline. Motor examination with left upper extremity 4/5, left lower extremity 4+/5.  Right upper and lower extremity full strength Sensation intact Coordination examination with no obvious dysmetria  Medications  Current Facility-Administered Medications:    0.9 %  sodium chloride infusion, , Intravenous, PRN, Migdalia Dk, MD, Paused at 10/30/23 0751   acetaminophen (TYLENOL) tablet 650 mg, 650 mg, Per Tube, Q4H PRN, Oretha Milch, MD   cefTRIAXone (ROCEPHIN) 2 g in sodium chloride 0.9 % 100 mL IVPB, 2 g, Intravenous, Q24H, Olalere, Adewale A, MD, Stopped at 10/29/23 1223   Chlorhexidine Gluconate Cloth 2 % PADS 6 each, 6 each, Topical, Daily, Oretha Milch, MD, 6 each at 10/30/23 0345   dexmedetomidine (PRECEDEX) 400 MCG/100ML (4 mcg/mL) infusion, 0-1.2 mcg/kg/hr, Intravenous, Titrated, Agarwala, Ravi, MD, Last Rate: 10.53 mL/hr at 10/30/23 0800, 0.6 mcg/kg/hr at 10/30/23 0800    docusate (COLACE) 50 MG/5ML liquid 100 mg, 100 mg, Per Tube, BID, Pecola Leisure, Stephanie M, PA-C, 100 mg at 10/28/23 2210   enoxaparin (LOVENOX) injection 40 mg, 40 mg, Subcutaneous, Q24H, Olalere, Adewale A, MD, 40 mg at 10/29/23 0948   feeding supplement (PROSource TF20) liquid 60 mL, 60 mL, Per Tube, Daily, Olalere, Adewale A, MD, 60 mL at 10/28/23 0932   feeding supplement (VITAL 1.5 CAL) liquid 1,000 mL, 1,000 mL, Per Tube, Continuous, Olalere, Adewale A, MD, Last Rate: 40 mL/hr at 10/30/23 0800, Infusion Verify at 10/30/23 0800   insulin aspart (novoLOG) injection 0-15 Units, 0-15 Units, Subcutaneous, Q4H, Calton Dach I, RPH, 2 Units at 10/30/23 0757   levETIRAcetam (KEPPRA) IVPB 1000 mg/100 mL premix, 1,000 mg, Intravenous, Q12H, Rejeana Brock, MD, Last Rate: 400 mL/hr at 10/30/23 0800, Infusion Verify at 10/30/23 0800   multivitamin with minerals tablet 1 tablet, 1 tablet, Per Tube, Daily, Olalere, Adewale A, MD, 1 tablet at 10/28/23 1128   mupirocin ointment (BACTROBAN) 2 % 1 Application, 1 Application, Nasal, BID, Oretha Milch, MD, 1 Application at 10/29/23 2140   ondansetron (ZOFRAN) injection 4 mg, 4 mg, Intravenous, Q6H PRN, Tim Lair, PA-C   Oral care mouth rinse, 15 mL, Mouth Rinse, 4 times per day, Lynnell Catalan, MD, 15 mL at 10/30/23 0757   Oral care mouth rinse, 15 mL, Mouth Rinse, PRN, Agarwala, Ravi, MD, 15 mL at 10/30/23 0202   polyethylene glycol (MIRALAX / GLYCOLAX) packet 17 g, 17 g, Per Tube, Daily, Pecola Leisure,  Elenore Paddy, PA-C, 17 g at 10/28/23 0932   potassium chloride 10 mEq in 100 mL IVPB, 10 mEq, Intravenous, Q1 Hr x 6, Ogan, Okoronkwo U, MD, Last Rate: 100 mL/hr at 10/30/23 0800, Infusion Verify at 10/30/23 0800   thiamine (VITAMIN B1) tablet 100 mg, 100 mg, Per Tube, Daily, Olalere, Adewale A, MD, 100 mg at 10/28/23 1128  Labs and Diagnostic Imaging   CBC:  Recent Labs  Lab 10/27/23 2025 10/28/23 0509 10/29/23 0819  WBC 8.4 8.8 11.5*   NEUTROABS 3.3  --   --   HGB 12.3* 11.4* 12.6*  HCT 38.1* 34.8* 38.0*  MCV 95.5 92.1 90.9  PLT 245 225 212    Basic Metabolic Panel:  Lab Results  Component Value Date   NA 139 10/30/2023   K 3.1 (L) 10/30/2023   CO2 25 10/30/2023   GLUCOSE 172 (H) 10/30/2023   BUN 17 10/30/2023   CREATININE 0.75 10/30/2023   CALCIUM 8.4 (L) 10/30/2023   GFRNONAA >60 10/30/2023   GFRAA 126 08/26/2007   Lipid Panel:  Lab Results  Component Value Date   LDLCALC 40 07/20/2023   HgbA1c:  Lab Results  Component Value Date   HGBA1C 6.8 (H) 07/20/2023   Urine Drug Screen:     Component Value Date/Time   LABOPIA NONE DETECTED 07/12/2023 0112   COCAINSCRNUR NONE DETECTED 07/12/2023 0112   LABBENZ NONE DETECTED 07/12/2023 0112   AMPHETMU NONE DETECTED 07/12/2023 0112   THCU NONE DETECTED 07/12/2023 0112   LABBARB NONE DETECTED 07/12/2023 0112    Alcohol Level     Component Value Date/Time   ETH <10 07/12/2023 0329   INR  Lab Results  Component Value Date   INR 1.1 05/06/2023   Imaging personally reviewed MRI brain 10/07/2023-postoperative changes in the right frontal lobe with unchanged and irregular enhancement about the resection but slightly increased surrounding T2 hyperintense signal, which could represent posttreatment change but is concerning for progressive nonenhancing infiltrative tumor.  Redemonstrated extra-axial collection subjacent to the cranioplasty, slightly decreased now measuring 7 mm-previously 11 mm.  An additional extra-axial collection anterior to the right frontal lobe measures up to 8 mm-unchanged.   CT head on arrival Decreased size of right anterior extra-axial collection subjacent to craniotomy site.  Postoperative changes of the right frontal lobe with areas of encephalomalacia and gliosis.  Continuous EEG-continuous slowing generalized and lateralized right hemisphere. Last 24 hours worth of reading pending at this time but clinically no seizure activity  seen.   Assessment   DENY CHEVEZ is a 78 y.o. male past medical history of right frontal GBM s/p resection, presented with status epilepticus. Also had hypomagnesemia and UTI which might of lowered the seizure threshold in the setting of structural reason for seizure. On initial examination, was extremely weak on the left and continued to remain weak on the left for prolonged period of time-suspect postictal state given significant improvement today. Will obtain MRI for further evaluation of the tumor as well as to rule out a stroke No seizures noted on LTM or clinically.  Overnight read pending but I will go ahead and discontinue the LTM EEG as he is clinically doing well.  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 Needs follow-up with outpatient neuro-oncology after discharge. Plan discussed with Dr. Denese Killings and patient's wife at bedside ______________________________________________________________________   Signed, Milon Dikes, MD [facility]  CRITICAL CARE ATTESTATION Performed by: Milon Dikes, MD Total critical care time: 31 minutes Critical care time was exclusive of separately billable procedures and treating other patients and/or supervising APPs/Residents/Students Critical care was necessary to treat or prevent imminent or life-threatening deterioration. This patient is critically ill and at significant risk for neurological  worsening and/or death and care requires constant monitoring. Critical care was time spent personally by me on the following activities: development of treatment plan with patient and/or surrogate as well as nursing, discussions with consultants, evaluation of patient's response to treatment, examination of patient, obtaining history from patient or surrogate, ordering and performing treatments and interventions, ordering and review of laboratory studies, ordering and review of radiographic studies, pulse oximetry, re-evaluation of patient's condition, participation in multidisciplinary rounds and medical decision making of high complexity in the care of this patient.

## 2023-10-30 NOTE — Evaluation (Signed)
 Clinical/Bedside Swallow Evaluation Patient Details  Name: Alex Church MRN: [id number] Date of Birth: 07-Mar-1946  Today's Date: 10/30/2023 Time: SLP Start Time (ACUTE ONLY): 9147 SLP Stop Time (ACUTE ONLY): 1003 SLP Time Calculation (min) (ACUTE ONLY): 26 min  Past Medical History:  Past Medical History:  Diagnosis Date   B12 deficiency    monthly shots   Diabetes mellitus    Glaucoma suspect    Glioblastoma (HCC)    Hyperlipemia    Hypertension    Past Surgical History:  Past Surgical History:  Procedure Laterality Date   APPLICATION OF CRANIAL NAVIGATION Right 05/12/2023   Procedure: APPLICATION OF CRANIAL NAVIGATION;  Surgeon: Bethann Goo, DO;  Location: [facility] OR;  Service: Neurosurgery;  Laterality: Right;   CRANIOTOMY Right 05/12/2023   Procedure: RIGHT STERIOSTACTIC FRONTAL CRANIOTOMY FOR TUMOR RESECTION;  Surgeon: Bethann Goo, DO;  Location: [facility] OR;  Service: Neurosurgery;  Laterality: Right;   HPI:  78 year old man who presented to [facility] 2/18 with witnessed seizure activity. MRI pending. ETT 2/18-2/20. PMHx significant for HTN, HLD, T2DM, B12 injections and glioblastoma (s/p craniotomy, XRT and chemotherapy, maintained on Temodar). Wife reports pain in chest after swallowing that they attributed to reflux.    Assessment / Plan / Recommendation  Clinical Impression  Pt exhibits mild oral deficits including anterior loss of purees on his L side without spontaneous attempts to self-manage, and mild oral residue with bites of graham cracker. He has intermittent coughing with thin liquids and ice chips, including delayed cough after trial of three ounces of water. Note that pt is also post-extubation on previous date and his vocal quality remains hoarse, soft. Given the above, further instrumental testing is indicated prior to initiating PO diet. Tentatively planned for this afternoon with radiology. Pt may have a few pieces of ice after oral care in the interim. Could also  consider giving essential meds crushed in puree.  SLP Visit Diagnosis: Dysphagia, unspecified (R13.10)    Aspiration Risk       Diet Recommendation NPO except meds;Ice chips PRN after oral care    Medication Administration: Crushed with puree    Other  Recommendations Oral Care Recommendations: Oral care QID    Recommendations for follow up therapy are one component of a multi-disciplinary discharge planning process, led by the attending physician.  Recommendations may be updated based on patient status, additional functional criteria and insurance authorization.  Follow up Recommendations        Assistance Recommended at Discharge    Functional Status Assessment    Frequency and Duration            Prognosis Prognosis for improved oropharyngeal function: Good      Swallow Study   General HPI: 78 year old man who presented to [facility] 2/18 with witnessed seizure activity. MRI pending. ETT 2/18-2/20. PMHx significant for HTN, HLD, T2DM, B12 injections and glioblastoma (s/p craniotomy, XRT and chemotherapy, maintained on Temodar). Wife reports pain in chest after swallowing that they attributed to reflux. Type of Study: Bedside Swallow Evaluation Previous Swallow Assessment: none in chart Diet Prior to this Study: NPO Temperature Spikes Noted: No Respiratory Status: Nasal cannula History of Recent Intubation: Yes Total duration of intubation (days): 3 days Date extubated: 10/29/23 Behavior/Cognition: Alert;Cooperative;Pleasant mood Oral Cavity Assessment: Within Functional Limits Oral Care Completed by SLP: No Oral Cavity - Dentition: Adequate natural dentition;Missing dentition Vision: Functional for self-feeding Self-Feeding Abilities: Able to feed self;Needs set up Patient Positioning: Upright in bed Baseline Vocal Quality: Hoarse;Low vocal  intensity Volitional Cough: Weak;Congested Volitional Swallow: Able to elicit    Oral/Motor/Sensory Function Overall Oral  Motor/Sensory Function: Within functional limits   Ice Chips Ice chips: Impaired Presentation: Spoon Pharyngeal Phase Impairments: Cough - Immediate   Thin Liquid Thin Liquid: Impaired Presentation: Cup;Self Fed;Straw;Spoon Pharyngeal  Phase Impairments: Cough - Delayed    Nectar Thick Nectar Thick Liquid: Not tested   Honey Thick Honey Thick Liquid: Not tested   Puree Puree: Impaired Presentation: Self Fed;Spoon Oral Phase Functional Implications: Left anterior spillage   Solid     Solid: Impaired Presentation: Self Fed Oral Phase Functional Implications: Oral residue      Mahala Menghini., M.A. CCC-SLP Acute Rehabilitation Services Office [phone]  Secure chat preferred  10/30/2023,10:30 AM

## 2023-10-31 ENCOUNTER — Inpatient Hospital Stay (HOSPITAL_COMMUNITY): Payer: Medicare Other

## 2023-10-31 DIAGNOSIS — G40901 Epilepsy, unspecified, not intractable, with status epilepticus: Secondary | ICD-10-CM | POA: Diagnosis not present

## 2023-10-31 DIAGNOSIS — J9600 Acute respiratory failure, unspecified whether with hypoxia or hypercapnia: Secondary | ICD-10-CM

## 2023-10-31 DIAGNOSIS — E440 Moderate protein-calorie malnutrition: Secondary | ICD-10-CM | POA: Diagnosis not present

## 2023-10-31 LAB — BASIC METABOLIC PANEL
Anion gap: 11 (ref 5–15)
BUN: 12 mg/dL (ref 8–23)
CO2: 25 mmol/L (ref 22–32)
Calcium: 8.4 mg/dL — ABNORMAL LOW (ref 8.9–10.3)
Chloride: 103 mmol/L (ref 98–111)
Creatinine, Ser: 0.76 mg/dL (ref 0.61–1.24)
GFR, Estimated: >60 mL/min (ref >60)
Glucose, Bld: 153 mg/dL — ABNORMAL HIGH (ref 70–99)
Potassium: 3.4 mmol/L — ABNORMAL LOW (ref 3.5–5.1)
Sodium: 139 mmol/L (ref 135–145)

## 2023-10-31 LAB — TRIGLYCERIDES: Triglycerides: 88 mg/dL (ref <150)

## 2023-10-31 LAB — MAGNESIUM: Magnesium: 1.6 mg/dL — ABNORMAL LOW (ref 1.7–2.4)

## 2023-10-31 LAB — GLUCOSE, CAPILLARY
Glucose-Capillary: 134 mg/dL — ABNORMAL HIGH (ref 70–99)
Glucose-Capillary: 151 mg/dL — ABNORMAL HIGH (ref 70–99)
Glucose-Capillary: 156 mg/dL — ABNORMAL HIGH (ref 70–99)
Glucose-Capillary: 187 mg/dL — ABNORMAL HIGH (ref 70–99)
Glucose-Capillary: 238 mg/dL — ABNORMAL HIGH (ref 70–99)

## 2023-10-31 MED ORDER — DEXAMETHASONE SODIUM PHOSPHATE 4 MG/ML IJ SOLN
4.0000 mg | Freq: Two times a day (BID) | INTRAMUSCULAR | Status: DC
  Administered 2023-10-31 – 2023-11-05 (×10): 4 mg via INTRAVENOUS
  Filled 2023-10-31 (×10): qty 1

## 2023-10-31 MED ORDER — LORAZEPAM 2 MG/ML IJ SOLN
2.0000 mg | Freq: Once | INTRAMUSCULAR | Status: AC
  Administered 2023-10-31: 2 mg via INTRAVENOUS
  Filled 2023-10-31: qty 1

## 2023-10-31 MED ORDER — QUETIAPINE FUMARATE 25 MG PO TABS
25.0000 mg | ORAL_TABLET | Freq: Every day | ORAL | Status: DC
  Administered 2023-10-31 – 2023-11-02 (×3): 25 mg via ORAL
  Filled 2023-10-31 (×3): qty 1

## 2023-10-31 MED ORDER — DEXAMETHASONE SODIUM PHOSPHATE 10 MG/ML IJ SOLN
10.0000 mg | Freq: Once | INTRAMUSCULAR | Status: AC
  Administered 2023-10-31: 10 mg via INTRAVENOUS
  Filled 2023-10-31: qty 1

## 2023-10-31 MED ORDER — POTASSIUM CHLORIDE 20 MEQ PO PACK
40.0000 meq | PACK | Freq: Once | ORAL | Status: AC
  Administered 2023-10-31: 40 meq via ORAL
  Filled 2023-10-31: qty 2

## 2023-10-31 MED ORDER — METOPROLOL TARTRATE 5 MG/5ML IV SOLN
5.0000 mg | Freq: Once | INTRAVENOUS | Status: AC
  Administered 2023-10-31: 5 mg via INTRAVENOUS
  Filled 2023-10-31: qty 5

## 2023-10-31 MED ORDER — HALOPERIDOL LACTATE 5 MG/ML IJ SOLN
5.0000 mg | Freq: Once | INTRAMUSCULAR | Status: AC
  Administered 2023-10-31: 5 mg via INTRAVENOUS
  Filled 2023-10-31: qty 1

## 2023-10-31 MED ORDER — MAGNESIUM SULFATE IN D5W 1-5 GM/100ML-% IV SOLN
1.0000 g | Freq: Once | INTRAVENOUS | Status: AC
  Administered 2023-10-31: 1 g via INTRAVENOUS
  Filled 2023-10-31: qty 100

## 2023-10-31 NOTE — Progress Notes (Signed)
 Patient agitated/restless and constantly pulling off telemetry leads, gown, messing with PIV, etc. Dr. Joneen Roach notified.  Order for one time dose of Haldol. Haldol administered at 0049 with minimal effect.  Dr. Joneen Roach paged again to notify of continued agitation and insomnia. 2mg  of Ativan administered at 0244.  Patients been having frequent PVC's, in and out of bigeminy/trigeminy. Dr. Joneen Roach notified and 1mg  of Magnesium ordered/administering. Will continue to monitor.   Lyndal Pulley, RN 10/31/2023 4:41 AM

## 2023-10-31 NOTE — Progress Notes (Signed)
 [facility]  PROGRESS NOTE  Alex Church:[id number] DOB: [birth year] DOA: 10/27/2023 PCP: Wanda Plump, MD   Brief HPI:   78 year old man who presented to [facility] 2/18 with witnessed seizure activity. PMHx significant for HTN, HLD, T2DM, B12 injections and glioblastoma (s/p craniotomy, XRT and chemotherapy, maintained on Temodar).     Assessment/Plan:   Status epilepticus.  No evidence of recurrent seizures -Resolved, with Todd's paralysis -Neurology following -Continue Keppra 1 g twice daily  Encephalopathy -Multifactorial; due to UTI, status epilepticus  -Continue treatment as above  History of glioblastoma status postresection -Decadron was tapered off as per Dr. Barbaraann Cao recommendation -Neurology has restarted back on Decadron as patient is more lethargic today -Continue with Decadron 4 mg IV twice daily  UTI -Urine culture growing Pseudomonas aeruginosa -Continue ceftazidime  Diabetes mellitus type 2 -Continue sliding scale insulin with NovoLog -CBG well-controlled  History of hypertension -Blood pressure well-controlled  Hypokalemia -Replace potassium and follow BMP in am  Hypomagnesemia -Magnesium is 1.6 -Replace magnesium and check mag level in a.m.  Plan:    Medications     Chlorhexidine Gluconate Cloth  6 each Topical Daily   dexamethasone (DECADRON) injection  10 mg Intravenous Once   dexamethasone (DECADRON) injection  4 mg Intravenous Q12H   docusate  100 mg Oral BID   enoxaparin (LOVENOX) injection  40 mg Subcutaneous Q24H   feeding supplement  237 mL Oral TID BM   insulin aspart  0-15 Units Subcutaneous Q4H   multivitamin with minerals  1 tablet Oral Daily   mupirocin ointment  1 Application Nasal BID   mouth rinse  15 mL Mouth Rinse 4 times per day   polyethylene glycol  17 g Oral Daily   thiamine  100 mg Oral Daily     Data Reviewed:   CBG:  Recent Labs  Lab 10/30/23 1601 10/30/23 2156 10/31/23 0016 10/31/23 0720  10/31/23 1116  GLUCAP 149* 152* 156* 151* 134*    SpO2: 96 % O2 Flow Rate (L/min): 2 L/min FiO2 (%): 30 %    Vitals:   10/31/23 0500 10/31/23 0655 10/31/23 0725 10/31/23 1115  BP: (!) 136/97 133/84 122/75 127/75  Pulse: 93  (!) 117 (!) 116  Resp: 19  20 19   Temp: 99.6 F (37.6 C)  99.4 F (37.4 C) 99.6 F (37.6 C)  TempSrc: Oral  Axillary Oral  SpO2: 96%  90% 96%  Weight:      Height:          Data Reviewed:  Basic Metabolic Panel: Recent Labs  Lab 10/27/23 2025 10/28/23 0509 10/28/23 0942 10/28/23 1719 10/29/23 0819 10/29/23 1703 10/30/23 0506 10/31/23 0712  NA 132* 134*  --   --  136  --  139 139  K 4.6 3.9  --   --  3.4*  --  3.1* 3.4*  CL 95* 101  --   --  101  --  100 103  CO2 18* 17*  --   --  22  --  25 25  GLUCOSE 201* 243*  --   --  194*  --  172* 153*  BUN 16 14  --   --  14  --  17 12  CREATININE 1.40* 1.07  --   --  1.15  --  0.75 0.76  CALCIUM 8.4* 7.9*  --   --  7.9*  --  8.4* 8.4*  MG 1.2* 1.9 1.8 1.4* 2.0 1.7  --  1.6*  PHOS  --  3.7 3.2 2.6 2.6 2.0*  --   --     CBC: Recent Labs  Lab 10/27/23 2005 10/27/23 2025 10/28/23 0509 10/29/23 0819  WBC  --  8.4 8.8 11.5*  NEUTROABS  --  3.3  --   --   HGB 12.6* 12.3* 11.4* 12.6*  HCT 37.0* 38.1* 34.8* 38.0*  MCV  --  95.5 92.1 90.9  PLT  --  245 225 212    LFT Recent Labs  Lab 10/27/23 2025  AST 25  ALT 18  ALKPHOS 103  BILITOT 0.7  PROT 6.6  ALBUMIN 3.1*     Antibiotics: Anti-infectives (From admission, onward)    Start     Dose/Rate Route Frequency Ordered Stop   10/30/23 1230  cefTAZidime (FORTAZ) 1 g in sodium chloride 0.9 % 100 mL IVPB        1 g 200 mL/hr over 30 Minutes Intravenous Every 8 hours 10/30/23 1139     10/28/23 1200  cefTRIAXone (ROCEPHIN) 2 g in sodium chloride 0.9 % 100 mL IVPB  Status:  Discontinued        2 g 200 mL/hr over 30 Minutes Intravenous Every 24 hours 10/28/23 0831 10/30/23 1138        DVT prophylaxis: Lovenox  Code Status: Full  code  Family Communication: Discussed with patient's wife at bedside   CONSULTS    Subjective   No new complaints.  Has been lethargic.   Objective    Physical Examination:   Appears lethargic S1-S2, regular Lungs clear to auscultation bilaterally Abdomen is soft, nontender, no organomegaly  Status is: Inpatient:      Pressure Injury 10/27/23 Buttocks Posterior;Upper Stage 2 -  Partial thickness loss of dermis presenting as a shallow open injury with a red, pink wound bed without slough. (Active)  10/27/23 2300  Location: Buttocks  Location Orientation: Posterior;Upper  Staging: Stage 2 -  Partial thickness loss of dermis presenting as a shallow open injury with a red, pink wound bed without slough.  Wound Description (Comments):   Present on Admission: Yes        Alex Church S Alex Church   [facility] If 7PM-7AM, please contact night-coverage at [email or web address], Office  [phone]   10/31/2023, 11:24 AM  LOS: 4 days

## 2023-10-31 NOTE — Plan of Care (Signed)
  Problem: Education: Goal: Knowledge of General Education information will improve Description: Including pain rating scale, medication(s)/side effects and non-pharmacologic comfort measures Outcome: Not Progressing   Problem: Health Behavior/Discharge Planning: Goal: Ability to manage health-related needs will improve Outcome: Not Progressing   Problem: Clinical Measurements: Goal: Ability to maintain clinical measurements within normal limits will improve Outcome: Not Progressing Goal: Will remain free from infection Outcome: Not Progressing Goal: Diagnostic test results will improve Outcome: Not Progressing Goal: Respiratory complications will improve Outcome: Not Progressing Goal: Cardiovascular complication will be avoided Outcome: Not Progressing   Problem: Activity: Goal: Risk for activity intolerance will decrease Outcome: Not Progressing   Problem: Nutrition: Goal: Adequate nutrition will be maintained Outcome: Not Progressing   Problem: Coping: Goal: Level of anxiety will decrease Outcome: Not Progressing   Problem: Elimination: Goal: Will not experience complications related to bowel motility Outcome: Not Progressing Goal: Will not experience complications related to urinary retention Outcome: Not Progressing   Problem: Pain Managment: Goal: General experience of comfort will improve and/or be controlled Outcome: Not Progressing   Problem: Safety: Goal: Ability to remain free from injury will improve Outcome: Not Progressing   Problem: Skin Integrity: Goal: Risk for impaired skin integrity will decrease Outcome: Not Progressing   Problem: Education: Goal: Ability to describe self-care measures that may prevent or decrease complications (Diabetes Survival Skills Education) will improve Outcome: Not Progressing Goal: Individualized Educational Video(s) Outcome: Not Progressing   Problem: Coping: Goal: Ability to adjust to condition or change in  health will improve Outcome: Not Progressing   Problem: Fluid Volume: Goal: Ability to maintain a balanced intake and output will improve Outcome: Not Progressing   Problem: Health Behavior/Discharge Planning: Goal: Ability to identify and utilize available resources and services will improve Outcome: Not Progressing Goal: Ability to manage health-related needs will improve Outcome: Not Progressing   Problem: Metabolic: Goal: Ability to maintain appropriate glucose levels will improve Outcome: Not Progressing   Problem: Nutritional: Goal: Maintenance of adequate nutrition will improve Outcome: Not Progressing Goal: Progress toward achieving an optimal weight will improve Outcome: Not Progressing   Problem: Skin Integrity: Goal: Risk for impaired skin integrity will decrease Outcome: Not Progressing   Problem: Tissue Perfusion: Goal: Adequacy of tissue perfusion will improve Outcome: Not Progressing   Problem: Safety: Goal: Non-violent Restraint(s) Outcome: Not Progressing

## 2023-10-31 NOTE — Plan of Care (Signed)

## 2023-10-31 NOTE — Progress Notes (Signed)
 NEUROLOGY CONSULT FOLLOW UP NOTE   Date of service: October 31, 2023 Patient Name: Alex Church MRN:  [id number] DOB:  Jul 07, 1946  Interval Hx/subjective  Seen and examined.   transferred to the floor. More drowsy and somewhat weaker on that left arm compared to yesterday  Vitals   Vitals:   10/31/23 0017 10/31/23 0500 10/31/23 0655 10/31/23 0725  BP: 130/73 (!) 136/97 133/84 122/75  Pulse: (!) 137 93  (!) 117  Resp: 19 19  20   Temp: (!) 97.5 F (36.4 C) 99.6 F (37.6 C)  99.4 F (37.4 C)  TempSrc: Oral Oral  Axillary  SpO2: 95% 96%  90%  Weight:      Height:         Body mass index is 21.86 kg/m.  Physical Exam  General: Elderly man, sick looking, drowsy HEENT: Normocephalic/atraumatic Lungs: Scattered rales Neurological examination He is very sleepy, speech is extremely dysarthric today, more than yesterday. Able to tell me his name and able to follow simple commands but appears to be slower in doing those today than yesterday. Cranial nerves: Pupils equal round react light, extraocular movements intact, visual fields full, has a more obvious facial asymmetry today than yesterday with left nasolabial fold flattening.  Tongue and palate midline. Motor examination shows left upper and lower extremity strength barely 3/5 today in comparison to 4/5 yesterday. Sensation intact Coordination difficult to assess but no gross dysmetria observed  Medications  Current Facility-Administered Medications:    acetaminophen (TYLENOL) tablet 650 mg, 650 mg, Oral, Q4H PRN, Agarwala, Ravi, MD   cefTAZidime (FORTAZ) 1 g in sodium chloride 0.9 % 100 mL IVPB, 1 g, Intravenous, Q8H, Agarwala, Ravi, MD, Last Rate: 200 mL/hr at 10/31/23 0505, 1 g at 10/31/23 0505   Chlorhexidine Gluconate Cloth 2 % PADS 6 each, 6 each, Topical, Daily, Oretha Milch, MD, 6 each at 10/31/23 0925   dexmedetomidine (PRECEDEX) 400 MCG/100ML (4 mcg/mL) infusion, 0-1.2 mcg/kg/hr, Intravenous, Titrated, Agarwala,  Daleen Bo, MD, Paused at 10/30/23 1233   docusate (COLACE) 50 MG/5ML liquid 100 mg, 100 mg, Oral, BID, Agarwala, Ravi, MD, 100 mg at 10/31/23 0932   enoxaparin (LOVENOX) injection 40 mg, 40 mg, Subcutaneous, Q24H, Olalere, Adewale A, MD, 40 mg at 10/31/23 0926   feeding supplement (ENSURE ENLIVE / ENSURE PLUS) liquid 237 mL, 237 mL, Oral, TID BM, Agarwala, Ravi, MD   insulin aspart (novoLOG) injection 0-15 Units, 0-15 Units, Subcutaneous, Q4H, Calton Dach I, RPH, 3 Units at 10/31/23 9562   levETIRAcetam (KEPPRA) IVPB 1000 mg/100 mL premix, 1,000 mg, Intravenous, Q12H, Rejeana Brock, MD, Last Rate: 400 mL/hr at 10/31/23 1308, 1,000 mg at 10/31/23 6578   multivitamin with minerals tablet 1 tablet, 1 tablet, Oral, Daily, Agarwala, Ravi, MD, 1 tablet at 10/31/23 0930   mupirocin ointment (BACTROBAN) 2 % 1 Application, 1 Application, Nasal, BID, Oretha Milch, MD, 1 Application at 10/31/23 0932   ondansetron [facility]) injection 4 mg, 4 mg, Intravenous, Q6H PRN, Cloyd Stagers M, PA-C, 4 mg at 10/30/23 1941   Oral care mouth rinse, 15 mL, Mouth Rinse, 4 times per day, Lynnell Catalan, MD, 15 mL at 10/31/23 0931   Oral care mouth rinse, 15 mL, Mouth Rinse, PRN, Agarwala, Ravi, MD, 15 mL at 10/30/23 0202   polyethylene glycol (MIRALAX / GLYCOLAX) packet 17 g, 17 g, Oral, Daily, Agarwala, Ravi, MD   thiamine (VITAMIN B1) tablet 100 mg, 100 mg, Oral, Daily, Agarwala, Ravi, MD, 100 mg at 10/31/23 0930  Labs  and Diagnostic Imaging   CBC:  Recent Labs  Lab 10/27/23 2025 10/28/23 0509 10/29/23 0819  WBC 8.4 8.8 11.5*  NEUTROABS 3.3  --   --   HGB 12.3* 11.4* 12.6*  HCT 38.1* 34.8* 38.0*  MCV 95.5 92.1 90.9  PLT 245 225 212    Basic Metabolic Panel:  Lab Results  Component Value Date   NA 139 10/31/2023   K 3.4 (L) 10/31/2023   CO2 25 10/31/2023   GLUCOSE 153 (H) 10/31/2023   BUN 12 10/31/2023   CREATININE 0.76 10/31/2023   CALCIUM 8.4 (L) 10/31/2023   GFRNONAA >60 10/31/2023    GFRAA 126 08/26/2007   Lipid Panel:  Lab Results  Component Value Date   LDLCALC 40 07/20/2023   HgbA1c:  Lab Results  Component Value Date   HGBA1C 6.8 (H) 07/20/2023   Urine Drug Screen:     Component Value Date/Time   LABOPIA NONE DETECTED 07/12/2023 0112   COCAINSCRNUR NONE DETECTED 07/12/2023 0112   LABBENZ NONE DETECTED 07/12/2023 0112   AMPHETMU NONE DETECTED 07/12/2023 0112   THCU NONE DETECTED 07/12/2023 0112   LABBARB NONE DETECTED 07/12/2023 0112    Alcohol Level     Component Value Date/Time   ETH <10 07/12/2023 0329   INR  Lab Results  Component Value Date   INR 1.1 05/06/2023   Imaging personally reviewed MRI brain 10/07/2023-postoperative changes in the right frontal lobe with unchanged and irregular enhancement about the resection but slightly increased surrounding T2 hyperintense signal, which could represent posttreatment change but is concerning for progressive nonenhancing infiltrative tumor.  Redemonstrated extra-axial collection subjacent to the cranioplasty, slightly decreased now measuring 7 mm-previously 11 mm.  An additional extra-axial collection anterior to the right frontal lobe measures up to 8 mm-unchanged.   CT head on arrival Decreased size of right anterior extra-axial collection subjacent to craniotomy site.  Postoperative changes of the right frontal lobe with areas of encephalomalacia and gliosis.  Continuous EEG-continuous slowing generalized and lateralized right hemisphere. Last 24 hours worth of reading pending at this time but clinically no seizure activity seen.  MR brain-too much artifact-nondiagnostic in my read   Assessment   Alex Church is a 78 y.o. male past medical history of right frontal GBM s/p resection, presented with status epilepticus. Also had hypomagnesemia and UTI which might of lowered the seizure threshold in the setting of structural reason for seizure. On initial examination, was extremely weak on the  left and continued to remain weak on the left for prolonged period of time-suspect postictal state given significant improvement yesterday-but today has further more worsening in the mental status and somewhat more worsened left-sided strength Might just be fluctuations due to poor brain reserve, and UTI-but will obtain a CT head stat.  MRI of the brain was almost nondiagnostic  Of note, his steroids were tapered down as per the note of 10/13/2023 when Dr. Barbaraann Cao had recommended Decadron 2 mg daily for 5 days and then 1 mg daily for 5 days and then stopping.  I would give him some steroids at this point-see recommendations below  Impression: Status epilepticus-clinically and electrographically resolved with ensuing Todd's paralysis that is resolving with some worsening in weakness today Right frontal GBM status post resection Encephalopathy-multifactorial including that from metabolic derangements hypomagnesemia as well as UTI as well as prolonged status epilepticus with poor brain reserve  Recommendations  Continue on Keppra 1 g twice daily Maintain seizure precautions CT head without contrast I would  give him Decadron 10 mg IV x 1 and then start him on Decadron 4 mg twice daily Medical management of metabolic derangements and UTI per PCCM-will discuss with hospitalist regarding appropriateness of the antibiotic  Needs follow-up with outpatient neuro-oncology after discharge.  Plan discussed with Dr. Sharl Ma and patient's wife at bedside ______________________________________________________________________   Signed, Milon Dikes, MD [facility]

## 2023-10-31 NOTE — Progress Notes (Signed)
 Speech Language Pathology Treatment: Dysphagia  Patient Details Name: Alex Church MRN: [id number] DOB: 08/05/1946 Today's Date: 10/31/2023 Time: 6578-4696 SLP Time Calculation (min) (ACUTE ONLY): 15 min  Assessment / Plan / Recommendation Clinical Impression  Pt's wife states pt has been increasingly confused and agitated since waking up from a nap. He required Mod cueing to attend to self-fed PO trials of nectar thick liquids and purees and is very impulsive with intake. Pt follows commands to intermittently clear his throat and swallow multiple times but did not do so consistently. Reviewed MBS with pt's wife and reinforced education regarding drinking from via cup only. For now, recommend continuing full liquid diet thickened to nectar thick consistency with full supervision for rate control. SLP will continue following to assess ability to upgrade diet as clinically indicated.    HPI HPI: 78 year old man who presented to [facility] 2/18 with witnessed seizure activity. MRI Brain severely motion degraded. Repeat [facility] 2/22 shows no acute intracranial abnormality.  ETT 2/18-2/20. PMHx significant for HTN, HLD, T2DM, B12 injections and glioblastoma (s/p craniotomy, XRT and chemotherapy, maintained on Temodar). Wife reports pain in chest after swallowing that they attributed to reflux.      SLP Plan  Continue with current plan of care      Recommendations for follow up therapy are one component of a multi-disciplinary discharge planning process, led by the attending physician.  Recommendations may be updated based on patient status, additional functional criteria and insurance authorization.    Recommendations  Diet recommendations: Nectar-thick liquid Liquids provided via: Cup;No straw Medication Administration: Crushed with puree Supervision: Staff to assist with self feeding;Full supervision/cueing for compensatory strategies;Trained caregiver to feed patient Compensations: Minimize  environmental distractions;Slow rate;Small sips/bites Postural Changes and/or Swallow Maneuvers: Seated upright 90 degrees                  Oral care BID   Frequent or constant Supervision/Assistance Dysphagia, oropharyngeal phase (R13.12)     Continue with current plan of care     Gwynneth Aliment, M.A., CF-SLP Speech Language Pathology, Acute Rehabilitation Services  Secure Chat preferred [phone]   10/31/2023, 3:56 PM

## 2023-11-01 DIAGNOSIS — G40901 Epilepsy, unspecified, not intractable, with status epilepticus: Secondary | ICD-10-CM | POA: Diagnosis not present

## 2023-11-01 DIAGNOSIS — J9600 Acute respiratory failure, unspecified whether with hypoxia or hypercapnia: Secondary | ICD-10-CM | POA: Diagnosis not present

## 2023-11-01 DIAGNOSIS — E440 Moderate protein-calorie malnutrition: Secondary | ICD-10-CM | POA: Diagnosis not present

## 2023-11-01 LAB — BASIC METABOLIC PANEL
Anion gap: 10 (ref 5–15)
BUN: 15 mg/dL (ref 8–23)
CO2: 25 mmol/L (ref 22–32)
Calcium: 8.8 mg/dL — ABNORMAL LOW (ref 8.9–10.3)
Chloride: 105 mmol/L (ref 98–111)
Creatinine, Ser: 0.85 mg/dL (ref 0.61–1.24)
GFR, Estimated: >60 mL/min (ref >60)
Glucose, Bld: 163 mg/dL — ABNORMAL HIGH (ref 70–99)
Potassium: 3.4 mmol/L — ABNORMAL LOW (ref 3.5–5.1)
Sodium: 140 mmol/L (ref 135–145)

## 2023-11-01 LAB — GLUCOSE, CAPILLARY
Glucose-Capillary: 152 mg/dL — ABNORMAL HIGH (ref 70–99)
Glucose-Capillary: 161 mg/dL — ABNORMAL HIGH (ref 70–99)
Glucose-Capillary: 211 mg/dL — ABNORMAL HIGH (ref 70–99)
Glucose-Capillary: 225 mg/dL — ABNORMAL HIGH (ref 70–99)
Glucose-Capillary: 299 mg/dL — ABNORMAL HIGH (ref 70–99)
Glucose-Capillary: 382 mg/dL — ABNORMAL HIGH (ref 70–99)

## 2023-11-01 LAB — MAGNESIUM: Magnesium: 1.4 mg/dL — ABNORMAL LOW (ref 1.7–2.4)

## 2023-11-01 MED ORDER — LEVETIRACETAM 500 MG PO TABS
1000.0000 mg | ORAL_TABLET | Freq: Two times a day (BID) | ORAL | Status: DC
  Administered 2023-11-01 – 2023-11-05 (×8): 1000 mg via ORAL
  Filled 2023-11-01 (×8): qty 2

## 2023-11-01 MED ORDER — MAGNESIUM SULFATE 2 GM/50ML IV SOLN
2.0000 g | Freq: Once | INTRAVENOUS | Status: AC
  Administered 2023-11-01: 2 g via INTRAVENOUS
  Filled 2023-11-01: qty 50

## 2023-11-01 MED ORDER — POTASSIUM CHLORIDE 20 MEQ PO PACK
40.0000 meq | PACK | Freq: Once | ORAL | Status: AC
  Administered 2023-11-01: 40 meq via ORAL
  Filled 2023-11-01: qty 2

## 2023-11-01 NOTE — Progress Notes (Signed)
 [facility]  PROGRESS NOTE  Alex Church:[id number] DOB: 02/08/1946 DOA: 10/27/2023 PCP: Wanda Plump, MD   Brief HPI:   78 year old man who presented to [facility] 2/18 with witnessed seizure activity. PMHx significant for HTN, HLD, T2DM, B12 injections and glioblastoma (s/p craniotomy, XRT and chemotherapy, maintained on Temodar).     Assessment/Plan:   Status epilepticus.  No evidence of recurrent seizures -Resolved, with Todd's paralysis -Neurology following; CT head obtained yesterday was unremarkable -Continue Keppra 1 g twice daily  Encephalopathy -Multifactorial; due to UTI, status epilepticus  -Continue treatment as above  History of glioblastoma status postresection -Decadron was tapered off as per Dr. Barbaraann Cao recommendation -Neurology has restarted back on Decadron as patient is more lethargic today -Continue with Decadron 4 mg IV twice daily -Decadron to be tapered as outpatient -Follow-up with neuro-oncology as outpatient after discharge  UTI -Urine culture growing Pseudomonas aeruginosa -Continue ceftazidime  Diabetes mellitus type 2 -Continue sliding scale insulin with NovoLog -CBG well-controlled  History of hypertension -Blood pressure well-controlled  Hypokalemia -Replace potassium and follow BMP in am  Hypomagnesemia -Magnesium is 1.4 -Replace magnesium and check mag level in a.m.  Disposition -Likely go to skilled nursing facility for rehab    Medications     Chlorhexidine Gluconate Cloth  6 each Topical Daily   dexamethasone (DECADRON) injection  4 mg Intravenous Q12H   docusate  100 mg Oral BID   enoxaparin (LOVENOX) injection  40 mg Subcutaneous Q24H   feeding supplement  237 mL Oral TID BM   insulin aspart  0-15 Units Subcutaneous Q4H   multivitamin with minerals  1 tablet Oral Daily   mupirocin ointment  1 Application Nasal BID   mouth rinse  15 mL Mouth Rinse 4 times per day   polyethylene glycol  17 g Oral Daily   QUEtiapine   25 mg Oral QHS   thiamine  100 mg Oral Daily     Data Reviewed:   CBG:  Recent Labs  Lab 10/31/23 1632 10/31/23 2016 11/01/23 0022 11/01/23 0420 11/01/23 0832  GLUCAP 187* 238* 211* 161* 152*    SpO2: 96 % O2 Flow Rate (L/min): 2 L/min FiO2 (%): 30 %    Vitals:   10/31/23 2014 11/01/23 0016 11/01/23 0419 11/01/23 0831  BP: (!) 140/78 135/60 129/83 129/84  Pulse: (!) 129 91 (!) 126 (!) 125  Resp: 18 18 19 16   Temp: 98.1 F (36.7 C) (!) 97.5 F (36.4 C) 98.1 F (36.7 C) 97.6 F (36.4 C)  TempSrc: Oral Axillary Oral Oral  SpO2: 93% 92% 93% 96%  Weight:      Height:          Data Reviewed:  Basic Metabolic Panel: Recent Labs  Lab 10/28/23 0509 10/28/23 0942 10/28/23 1719 10/29/23 0819 10/29/23 1703 10/30/23 0506 10/31/23 0712 11/01/23 0632  NA 134*  --   --  136  --  139 139 140  K 3.9  --   --  3.4*  --  3.1* 3.4* 3.4*  CL 101  --   --  101  --  100 103 105  CO2 17*  --   --  22  --  25 25 25   GLUCOSE 243*  --   --  194*  --  172* 153* 163*  BUN 14  --   --  14  --  17 12 15   CREATININE 1.07  --   --  1.15  --  0.75 0.76 0.85  CALCIUM 7.9*  --   --  7.9*  --  8.4* 8.4* 8.8*  MG 1.9 1.8 1.4* 2.0 1.7  --  1.6* 1.4*  PHOS 3.7 3.2 2.6 2.6 2.0*  --   --   --     CBC: Recent Labs  Lab 10/27/23 2005 10/27/23 2025 10/28/23 0509 10/29/23 0819  WBC  --  8.4 8.8 11.5*  NEUTROABS  --  3.3  --   --   HGB 12.6* 12.3* 11.4* 12.6*  HCT 37.0* 38.1* 34.8* 38.0*  MCV  --  95.5 92.1 90.9  PLT  --  245 225 212    LFT Recent Labs  Lab 10/27/23 2025  AST 25  ALT 18  ALKPHOS 103  BILITOT 0.7  PROT 6.6  ALBUMIN 3.1*     Antibiotics: Anti-infectives (From admission, onward)    Start     Dose/Rate Route Frequency Ordered Stop   10/30/23 1230  cefTAZidime (FORTAZ) 1 g in sodium chloride 0.9 % 100 mL IVPB        1 g 200 mL/hr over 30 Minutes Intravenous Every 8 hours 10/30/23 1139     10/28/23 1200  cefTRIAXone (ROCEPHIN) 2 g in sodium chloride 0.9  % 100 mL IVPB  Status:  Discontinued        2 g 200 mL/hr over 30 Minutes Intravenous Every 24 hours 10/28/23 0831 10/30/23 1138        DVT prophylaxis: Lovenox  Code Status: Full code  Family Communication: Discussed with patient's wife at bedside   CONSULTS    Subjective   Denies any complaints.  Much more alert today.   Objective    Physical Examination:  Appears in no acute distress S1-S2, regular, no murmur auscultated Lungs clear to auscultation bilaterally Abdomen is soft, nontender, no organomegaly  Status is: Inpatient:      Pressure Injury 10/27/23 Buttocks Posterior;Upper Stage 2 -  Partial thickness loss of dermis presenting as a shallow open injury with a red, pink wound bed without slough. (Active)  10/27/23 2300  Location: Buttocks  Location Orientation: Posterior;Upper  Staging: Stage 2 -  Partial thickness loss of dermis presenting as a shallow open injury with a red, pink wound bed without slough.  Wound Description (Comments):   Present on Admission: Yes        Laelah Siravo S Gannon Heinzman   [facility] If 7PM-7AM, please contact night-coverage at [email or web address], Office  [phone]   11/01/2023, 9:11 AM  LOS: 5 days

## 2023-11-01 NOTE — NC FL2 (Signed)
  MEDICAID FL2 LEVEL OF CARE FORM     IDENTIFICATION  Patient Name: Alex Church Birthdate: November 04, 1945 Sex: male Admission Date (Current Location): 10/27/2023  [facility] and [place] Number:  Producer, television/film/video and Address:  [facility] [place]. [facility], [address], [place], [place] [address]      Provider Number: [id number]  Attending Physician Name and Address:  Meredeth Ide, MD  Relative Name and Phone Number:  Laurier Jasperson; Spouse: [phone]    Current Level of Care: Hospital Recommended Level of Care: Skilled Nursing Facility Prior Approval Number:    Date Approved/Denied:   PASRR Number: [id number]  Discharge Plan: SNF    Current Diagnoses: Patient Active Problem List   Diagnosis Date Noted   Malnutrition of moderate degree 10/28/2023   Status epilepticus (HCC) 10/27/2023   Seizure (HCC) 07/12/2023   Hypomagnesemia 07/12/2023   Myocardial injury 07/12/2023   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   Glioblastoma, 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   PVC (premature ventricular contraction) 08/27/2022   Glaucoma suspect 04/30/2020   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   Annual physical exam 06/30/2016   PCP NOTES >>>>>>>>>>>>>>>>>>>>> 02/25/2016   DM2 (diabetes mellitus, type 2) (HCC) 07/06/2013   B12 deficiency 07/06/2013   CAROTID BRUIT, RIGHT 10/01/2010   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   BPH (benign prostatic hyperplasia) 08/26/2007   Hyperlipidemia 10/02/2006   Essential hypertension 10/02/2006    Orientation RESPIRATION BLADDER Height & Weight     Self  Normal Incontinent, External catheter Weight: 169 lb 8.5 oz (76.9 kg) Height:  6' (182.9 cm)  BEHAVIORAL SYMPTOMS/MOOD NEUROLOGICAL BOWEL NUTRITION STATUS    Convulsions/Seizures (Hx of seizures) Continent Diet (See dc summary)  AMBULATORY STATUS COMMUNICATION OF NEEDS Skin   Extensive Assist Verbally PU Stage and Appropriate Care (Stage II on buttocks)                        Personal Care Assistance Level of Assistance  Bathing, Feeding, Dressing Bathing Assistance: Maximum assistance Feeding assistance: Limited assistance Dressing Assistance: Maximum assistance     Functional Limitations Info             SPECIAL CARE FACTORS FREQUENCY  PT (By licensed PT), OT (By licensed OT), Speech therapy     PT Frequency: 5x/week OT Frequency: 5x/week     Speech Therapy Frequency: 2x/week      Contractures Contractures Info: Not present    Additional Factors Info  Insulin Sliding Scale Code Status Info: Full Allergies Info: NKA   Insulin Sliding Scale Info: Please see dc summary       Current Medications (11/01/2023):  This is the current hospital active medication list Current Facility-Administered Medications  Medication Dose Route Frequency Provider Last Rate Last Admin   acetaminophen (TYLENOL) tablet 650 mg  650 mg Oral Q4H PRN Agarwala, Daleen Bo, MD       cefTAZidime (FORTAZ) 1 g in sodium chloride 0.9 % 100 mL IVPB  1 g Intravenous Q8H Agarwala, Ravi, MD 200 mL/hr at 11/01/23 0810 1 g at 11/01/23 0810   Chlorhexidine Gluconate Cloth 2 % PADS 6 each  6 each Topical Daily Oretha Milch, MD   6 each at 10/31/23 0925   dexamethasone (DECADRON) injection 4 mg  4 mg Intravenous Q12H Milon Dikes, MD   4 mg at 10/31/23 2118   dexmedetomidine (PRECEDEX) 400 MCG/100ML (4 mcg/mL) infusion  0-1.2 mcg/kg/hr Intravenous Titrated Lynnell Catalan, MD   Paused at 10/30/23  1233   docusate (COLACE) 50 MG/5ML liquid 100 mg  100 mg Oral BID Lynnell Catalan, MD   100 mg at 10/31/23 2110   enoxaparin (LOVENOX) injection 40 mg  40 mg Subcutaneous Q24H Olalere, Adewale A, MD   40 mg at 10/31/23 0926   feeding supplement (ENSURE ENLIVE / ENSURE PLUS) liquid 237 mL  237 mL Oral TID BM Agarwala, Daleen Bo, MD       insulin aspart (novoLOG) injection 0-15 Units  0-15 Units Subcutaneous Q4H Calton Dach I, RPH   3 Units at 11/01/23 0442   levETIRAcetam (KEPPRA) IVPB  1000 mg/100 mL premix  1,000 mg Intravenous Q12H Rejeana Brock, MD 400 mL/hr at 10/31/23 2112 1,000 mg at 10/31/23 2112   magnesium sulfate IVPB 2 g 50 mL  2 g Intravenous Once Meredeth Ide, MD       multivitamin with minerals tablet 1 tablet  1 tablet Oral Daily Agarwala, Daleen Bo, MD   1 tablet at 10/31/23 0930   mupirocin ointment (BACTROBAN) 2 % 1 Application  1 Application Nasal BID Oretha Milch, MD   1 Application at 10/31/23 2112   ondansetron (ZOFRAN) injection 4 mg  4 mg Intravenous Q6H PRN Cloyd Stagers M, PA-C   4 mg at 10/30/23 1941   Oral care mouth rinse  15 mL Mouth Rinse 4 times per day Lynnell Catalan, MD   15 mL at 10/31/23 2111   Oral care mouth rinse  15 mL Mouth Rinse PRN Lynnell Catalan, MD   15 mL at 10/30/23 0202   polyethylene glycol (MIRALAX / GLYCOLAX) packet 17 g  17 g Oral Daily Agarwala, Ravi, MD       potassium chloride (KLOR-CON) packet 40 mEq  40 mEq Oral Once [place], Sarina Ill, MD       QUEtiapine (SEROQUEL) tablet 25 mg  25 mg Oral QHS Crosley, Debby, MD   25 mg at 10/31/23 2110   thiamine (VITAMIN B1) tablet 100 mg  100 mg Oral Daily Lynnell Catalan, MD   100 mg at 10/31/23 0930     Discharge Medications: Please see discharge summary for a list of discharge medications.  Relevant Imaging Results:  Relevant Lab Results:   Additional Information SSN: [id number]  Atwell Mcdanel E Rodrickus Min, LCSW

## 2023-11-01 NOTE — Plan of Care (Signed)

## 2023-11-01 NOTE — Progress Notes (Signed)
 NEUROLOGY CONSULT FOLLOW UP NOTE   Date of service: November 01, 2023 Patient Name: Alex Church MRN:  [id number] DOB:  Jan 03, 1946  Interval Hx/subjective  Seen and examined.   Much more awake today.  Sitting in bed eating breakfast  Vitals   Vitals:   10/31/23 2014 11/01/23 0016 11/01/23 0419 11/01/23 0831  BP: (!) 140/78 135/60 129/83 129/84  Pulse: (!) 129 91 (!) 126 (!) 125  Resp: 18 18 19 16   Temp: 98.1 F (36.7 C) (!) 97.5 F (36.4 C) 98.1 F (36.7 C) 97.6 F (36.4 C)  TempSrc: Oral Axillary Oral Oral  SpO2: 93% 92% 93% 96%  Weight:      Height:         Body mass index is 22.99 kg/m.  Physical Exam  General: Elderly man, sick looking, drowsy HEENT: Normocephalic/atraumatic Lungs: Scattered rales Neurological examination Awake alert sitting in bed.  Able to tell me his name.  Voice is much clearer today.  Mild dysarthria No aphasia Still has diminished attention concentration Oriented to the fact that he is in the hospital, could not tell me the year. Cranial nerve examination with mild left lower facial weakness but otherwise intact. Motor examination with 4/5 left upper extremity and lower extremity strength.  Right side full strength. Sensory examination: Intact to touch all over Coordination no dysmetria on the right, dysmetria proportionate to weakness on the left  Medications  Current Facility-Administered Medications:    acetaminophen (TYLENOL) tablet 650 mg, 650 mg, Oral, Q4H PRN, Agarwala, Ravi, MD   cefTAZidime (FORTAZ) 1 g in sodium chloride 0.9 % 100 mL IVPB, 1 g, Intravenous, Q8H, Agarwala, Ravi, MD, Last Rate: 200 mL/hr at 11/01/23 0810, 1 g at 11/01/23 0810   Chlorhexidine Gluconate Cloth 2 % PADS 6 each, 6 each, Topical, Daily, Oretha Milch, MD, 6 each at 10/31/23 0925   dexamethasone (DECADRON) injection 4 mg, 4 mg, Intravenous, Q12H, Milon Dikes, MD, 4 mg at 10/31/23 2118   dexmedetomidine (PRECEDEX) 400 MCG/100ML (4 mcg/mL) infusion,  0-1.2 mcg/kg/hr, Intravenous, Titrated, Agarwala, Daleen Bo, MD, Paused at 10/30/23 1233   docusate (COLACE) 50 MG/5ML liquid 100 mg, 100 mg, Oral, BID, Agarwala, Ravi, MD, 100 mg at 10/31/23 2110   enoxaparin (LOVENOX) injection 40 mg, 40 mg, Subcutaneous, Q24H, Olalere, Adewale A, MD, 40 mg at 10/31/23 0926   feeding supplement (ENSURE ENLIVE / ENSURE PLUS) liquid 237 mL, 237 mL, Oral, TID BM, Agarwala, Ravi, MD   insulin aspart (novoLOG) injection 0-15 Units, 0-15 Units, Subcutaneous, Q4H, Calton Dach I, RPH, 3 Units at 11/01/23 0442   levETIRAcetam (KEPPRA) IVPB 1000 mg/100 mL premix, 1,000 mg, Intravenous, Q12H, Rejeana Brock, MD, Last Rate: 400 mL/hr at 10/31/23 2112, 1,000 mg at 10/31/23 2112   magnesium sulfate IVPB 2 g 50 mL, 2 g, Intravenous, Once, Meredeth Ide, MD   multivitamin with minerals tablet 1 tablet, 1 tablet, Oral, Daily, Agarwala, Ravi, MD, 1 tablet at 10/31/23 0930   mupirocin ointment (BACTROBAN) 2 % 1 Application, 1 Application, Nasal, BID, Oretha Milch, MD, 1 Application at 10/31/23 2112   ondansetron [facility]) injection 4 mg, 4 mg, Intravenous, Q6H PRN, Cloyd Stagers M, PA-C, 4 mg at 10/30/23 1941   Oral care mouth rinse, 15 mL, Mouth Rinse, 4 times per day, Lynnell Catalan, MD, 15 mL at 10/31/23 2111   Oral care mouth rinse, 15 mL, Mouth Rinse, PRN, Lynnell Catalan, MD, 15 mL at 10/30/23 0202   polyethylene glycol (MIRALAX / GLYCOLAX) packet  17 g, 17 g, Oral, Daily, Agarwala, Ravi, MD   potassium chloride (KLOR-CON) packet 40 mEq, 40 mEq, Oral, Once, [place], Sarina Ill, MD   QUEtiapine (SEROQUEL) tablet 25 mg, 25 mg, Oral, QHS, Crosley, Debby, MD, 25 mg at 10/31/23 2110   thiamine (VITAMIN B1) tablet 100 mg, 100 mg, Oral, Daily, Agarwala, Ravi, MD, 100 mg at 10/31/23 0930  Labs and Diagnostic Imaging   CBC:  Recent Labs  Lab 10/27/23 2025 10/28/23 0509 10/29/23 0819  WBC 8.4 8.8 11.5*  NEUTROABS 3.3  --   --   HGB 12.3* 11.4* 12.6*  HCT 38.1* 34.8*  38.0*  MCV 95.5 92.1 90.9  PLT 245 225 212    Basic Metabolic Panel:  Lab Results  Component Value Date   NA 140 11/01/2023   K 3.4 (L) 11/01/2023   CO2 25 11/01/2023   GLUCOSE 163 (H) 11/01/2023   BUN 15 11/01/2023   CREATININE 0.85 11/01/2023   CALCIUM 8.8 (L) 11/01/2023   GFRNONAA >60 11/01/2023   GFRAA 126 08/26/2007   Lipid Panel:  Lab Results  Component Value Date   LDLCALC 40 07/20/2023   HgbA1c:  Lab Results  Component Value Date   HGBA1C 6.8 (H) 07/20/2023   Urine Drug Screen:     Component Value Date/Time   LABOPIA NONE DETECTED 07/12/2023 0112   COCAINSCRNUR NONE DETECTED 07/12/2023 0112   LABBENZ NONE DETECTED 07/12/2023 0112   AMPHETMU NONE DETECTED 07/12/2023 0112   THCU NONE DETECTED 07/12/2023 0112   LABBARB NONE DETECTED 07/12/2023 0112    Alcohol Level     Component Value Date/Time   ETH <10 07/12/2023 0329   INR  Lab Results  Component Value Date   INR 1.1 05/06/2023   Imaging personally reviewed MRI brain 10/07/2023-postoperative changes in the right frontal lobe with unchanged and irregular enhancement about the resection but slightly increased surrounding T2 hyperintense signal, which could represent posttreatment change but is concerning for progressive nonenhancing infiltrative tumor.  Redemonstrated extra-axial collection subjacent to the cranioplasty, slightly decreased now measuring 7 mm-previously 11 mm.  An additional extra-axial collection anterior to the right frontal lobe measures up to 8 mm-unchanged.   CT head on arrival Decreased size of right anterior extra-axial collection subjacent to craniotomy site.  Postoperative changes of the right frontal lobe with areas of encephalomalacia and gliosis.  Continuous EEG-continuous slowing generalized and lateralized right hemisphere. Last 24 hours worth of reading pending at this time but clinically no seizure activity seen.  MR brain-too much artifact-nondiagnostic in my read  CT  head from yesterday with no acute changes   Assessment   [name] is a 78 y.o. male past medical history of right frontal GBM s/p resection, presented with status epilepticus. Also had hypomagnesemia and UTI which might of lowered the seizure threshold in the setting of structural reason for seizure. Was weak on the left on initial examination which somewhat improved with time but then he worsened again over the course of his admission here.  Decadron was added yesterday and he was started on Decadron 4 mg twice daily.  Today he is much more awake, still with some weakness on the left but much improved from before.  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  To be tapered down outpatient Medical management of metabolic derangements and UTI per primary team. Therapy assessments.  Wife concerned that he may not be a safe discharge home.  Will discuss with the hospitalist team  Needs follow-up with outpatient neuro-oncology after discharge.  Plan discussed with Dr. Sharl Ma over secure chat.  Neurology will be available as needed  ______________________________________________________________________   Signed, Milon Dikes, MD [facility]

## 2023-11-01 NOTE — Plan of Care (Signed)
  Problem: Education: Goal: Knowledge of General Education information will improve Description: Including pain rating scale, medication(s)/side effects and non-pharmacologic comfort measures Outcome: Not Progressing   Problem: Health Behavior/Discharge Planning: Goal: Ability to manage health-related needs will improve Outcome: Not Progressing   Problem: Clinical Measurements: Goal: Ability to maintain clinical measurements within normal limits will improve Outcome: Not Progressing Goal: Will remain free from infection Outcome: Not Progressing Goal: Diagnostic test results will improve Outcome: Not Progressing Goal: Respiratory complications will improve Outcome: Not Progressing Goal: Cardiovascular complication will be avoided Outcome: Not Progressing   Problem: Activity: Goal: Risk for activity intolerance will decrease Outcome: Not Progressing   Problem: Nutrition: Goal: Adequate nutrition will be maintained Outcome: Not Progressing   Problem: Coping: Goal: Level of anxiety will decrease Outcome: Not Progressing   Problem: Elimination: Goal: Will not experience complications related to bowel motility Outcome: Not Progressing Goal: Will not experience complications related to urinary retention Outcome: Not Progressing   Problem: Pain Managment: Goal: General experience of comfort will improve and/or be controlled Outcome: Not Progressing   Problem: Safety: Goal: Ability to remain free from injury will improve Outcome: Not Progressing   Problem: Skin Integrity: Goal: Risk for impaired skin integrity will decrease Outcome: Not Progressing   Problem: Education: Goal: Ability to describe self-care measures that may prevent or decrease complications (Diabetes Survival Skills Education) will improve Outcome: Not Progressing Goal: Individualized Educational Video(s) Outcome: Not Progressing   Problem: Coping: Goal: Ability to adjust to condition or change in  health will improve Outcome: Not Progressing   Problem: Fluid Volume: Goal: Ability to maintain a balanced intake and output will improve Outcome: Not Progressing   Problem: Health Behavior/Discharge Planning: Goal: Ability to identify and utilize available resources and services will improve Outcome: Not Progressing Goal: Ability to manage health-related needs will improve Outcome: Not Progressing   Problem: Metabolic: Goal: Ability to maintain appropriate glucose levels will improve Outcome: Not Progressing   Problem: Nutritional: Goal: Maintenance of adequate nutrition will improve Outcome: Not Progressing Goal: Progress toward achieving an optimal weight will improve Outcome: Not Progressing   Problem: Skin Integrity: Goal: Risk for impaired skin integrity will decrease Outcome: Not Progressing   Problem: Tissue Perfusion: Goal: Adequacy of tissue perfusion will improve Outcome: Not Progressing   Problem: Safety: Goal: Non-violent Restraint(s) Outcome: Not Progressing

## 2023-11-01 NOTE — Progress Notes (Signed)
 Contacted pt's wife to discuss DC plan. Wife prefers for pt to be DC to CIR but she is agreeable to SNF if pt doesn't meet the criteria for CIR. She reports that pt has been in CIR before. Explained to wife that we will assist with HH/DME if pt is DC home. A SW/CM will assist with his DC needs for HH/DME if pt is DC to CIR or SNF. She verbalized understanding. Will continue to f/u to assist with the DC plan.

## 2023-11-01 NOTE — TOC Initial Note (Signed)
 Transition of Care [facility]) - Initial/Assessment Note    Patient Details  Name: Alex Church MRN: [id number] Date of Birth: Jan 18, 1946  Transition of Care [facility] CM/SW Contact:    Marliss Coots, LCSW Phone Number: 11/01/2023, 9:35 AM  Clinical Narrative:                  9:35 AM CSW introduced herself and role to patient at bedside. Patient's spouse, [name], was also present at bedside. CMA Alcario Drought was present shadowing CSW. Patient disoriented x3 at this time, but spouse consented to [facility] presence at bedside with CSW. CSW informed [name] of therapy recommendation of patient discharge to SNF. [name] expressed preference of patient discharge to CIR vs SNF but consented CSW to send referrals to SNFs in [facility] as back up option. [name] also expressed interest in home support for patient upon discharge from CIR/SNF. CSW relayed interests to medical team, including RNCM.    Barriers to Discharge: Continued Medical Work up   Patient Goals and CMS Choice            Expected Discharge Plan and Services In-house Referral: Clinical Social Work     Living arrangements for the past 2 months: Single Family Home                                      Prior Living Arrangements/Services Living arrangements for the past 2 months: Single Family Home Lives with:: Spouse Patient language and need for interpreter reviewed:: Yes        Need for Family Participation in Patient Care: Yes (Comment) Care giver support system in place?: Yes (comment)   Criminal Activity/Legal Involvement Pertinent to Current Situation/Hospitalization: No - Comment as needed  Activities of Daily Living      Permission Sought/Granted Permission sought to share information with : Facility Medical sales representative, Family Supports (SNF referral done with spouse consent) Permission granted to share information with : No (Contact information on chart)  Share Information with NAME: Shahram Alexopoulos  Permission granted to share info w AGENCY: SNF  Permission granted to share info w Relationship: Spouse  Permission granted to share info w Contact Information: [phone]  Emotional Assessment Appearance:: Appears stated age Attitude/Demeanor/Rapport: Unable to Assess Affect (typically observed): Unable to Assess Orientation: : Oriented to Self Alcohol / Substance Use: Not Applicable Psych Involvement: No (comment)  Admission diagnosis:  Hypomagnesemia [E83.42] Status epilepticus (HCC) [G40.901] Acute respiratory failure, unspecified whether with hypoxia or hypercapnia (HCC) [J96.00] Patient Active Problem List   Diagnosis Date Noted   Malnutrition of moderate degree 10/28/2023   Status epilepticus (HCC) 10/27/2023   Seizure (HCC) 07/12/2023   Hypomagnesemia 07/12/2023   Myocardial injury 07/12/2023   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   Glioblastoma, 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   PVC (premature ventricular contraction) 08/27/2022   Glaucoma suspect 04/30/2020   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 [place] [address] Phone: [phone] Fax: [phone]  [id number] [place], [place] - [phone]  [address] [facility] [address] [place] [address] Phone: [phone] Fax: [phone]     Social Drivers of Health (SDOH) Social History: SDOH Screenings   Food Insecurity: No Food Insecurity (10/31/2023)  Housing: Low Risk  (10/31/2023)  Transportation Needs: No Transportation  Needs (10/31/2023)  Utilities: Not At Risk (10/31/2023)  Alcohol Screen: Low Risk  (10/21/2022)  Depression (PHQ2-9): Low Risk  (07/20/2023)  Financial Resource Strain: Low Risk  (07/13/2023)  Physical Activity: Insufficiently Active (07/13/2023)  Social Connections: Moderately Isolated (10/31/2023)  Stress: Stress Concern Present (07/13/2023)  Tobacco Use: Low Risk  (10/27/2023)  Health Literacy: Adequate Health Literacy (06/04/2023)   SDOH Interventions:     Readmission Risk Interventions    05/15/2023   11:43 AM  Readmission Risk Prevention Plan  Transportation Screening Complete  Home Care Screening Complete  Medication Review (RN CM) Complete

## 2023-11-02 DIAGNOSIS — G40901 Epilepsy, unspecified, not intractable, with status epilepticus: Secondary | ICD-10-CM | POA: Diagnosis not present

## 2023-11-02 LAB — GLUCOSE, CAPILLARY
Glucose-Capillary: 123 mg/dL — ABNORMAL HIGH (ref 70–99)
Glucose-Capillary: 130 mg/dL — ABNORMAL HIGH (ref 70–99)
Glucose-Capillary: 136 mg/dL — ABNORMAL HIGH (ref 70–99)
Glucose-Capillary: 170 mg/dL — ABNORMAL HIGH (ref 70–99)
Glucose-Capillary: 215 mg/dL — ABNORMAL HIGH (ref 70–99)
Glucose-Capillary: 229 mg/dL — ABNORMAL HIGH (ref 70–99)
Glucose-Capillary: 283 mg/dL — ABNORMAL HIGH (ref 70–99)
Glucose-Capillary: 294 mg/dL — ABNORMAL HIGH (ref 70–99)
Glucose-Capillary: 336 mg/dL — ABNORMAL HIGH (ref 70–99)

## 2023-11-02 LAB — BASIC METABOLIC PANEL
Anion gap: 8 (ref 5–15)
BUN: 14 mg/dL (ref 8–23)
CO2: 26 mmol/L (ref 22–32)
Calcium: 8.7 mg/dL — ABNORMAL LOW (ref 8.9–10.3)
Chloride: 103 mmol/L (ref 98–111)
Creatinine, Ser: 0.82 mg/dL (ref 0.61–1.24)
GFR, Estimated: >60 mL/min (ref >60)
Glucose, Bld: 127 mg/dL — ABNORMAL HIGH (ref 70–99)
Potassium: 3.4 mmol/L — ABNORMAL LOW (ref 3.5–5.1)
Sodium: 137 mmol/L (ref 135–145)

## 2023-11-02 LAB — MAGNESIUM: Magnesium: 1.3 mg/dL — ABNORMAL LOW (ref 1.7–2.4)

## 2023-11-02 MED ORDER — MAGNESIUM SULFATE 4 GM/100ML IV SOLN
4.0000 g | Freq: Once | INTRAVENOUS | Status: AC
  Administered 2023-11-02: 4 g via INTRAVENOUS
  Filled 2023-11-02: qty 100

## 2023-11-02 MED ORDER — POTASSIUM CHLORIDE 20 MEQ PO PACK
40.0000 meq | PACK | ORAL | Status: AC
  Administered 2023-11-02 (×2): 40 meq via ORAL
  Filled 2023-11-02 (×2): qty 2

## 2023-11-02 MED ORDER — CHLORPROMAZINE HCL 25 MG/ML IJ SOLN
25.0000 mg | Freq: Once | INTRAMUSCULAR | Status: AC
  Administered 2023-11-03: 25 mg via INTRAMUSCULAR
  Filled 2023-11-02 (×2): qty 1

## 2023-11-02 NOTE — Progress Notes (Signed)
 Physical Therapy Treatment Patient Details Name: Alex Church MRN: [id number] DOB: 20-May-1946 Today's Date: 11/02/2023   History of Present Illness 78 year old man who presented to [facility] 2/18 with witnessed seizure activity. ETT 2/18-2/20. PMHx significant for HTN, HLD, T2DM, B12 injections and glioblastoma (s/p craniotomy, XRT and chemotherapy, maintained on Temodar).    PT Comments  Patient with improved use of LLE and able to raise it off the bed. Also able to take small, pivotal steps from bed to chair. Patient with decr trunk control/sitting balance at EOB with wife reporting that his trunk strength was most effected by the tumor. EOB sitting with up to mod assist due to tendency to lean posteriorly. Making good progress towards goals. Patient will benefit from continued inpatient follow up therapy, >3 hours/day    If plan is discharge home, recommend the following: A lot of help with walking and/or transfers;A lot of help with bathing/dressing/bathroom;Direct supervision/assist for medications management;Direct supervision/assist for financial management;Assist for transportation;Help with stairs or ramp for entrance;Supervision due to cognitive status   Can travel by private vehicle        Equipment Recommendations  None recommended by PT    Recommendations for Other Services       Precautions / Restrictions Precautions Precautions: Fall Restrictions Weight Bearing Restrictions Per Provider Order: No     Mobility  Bed Mobility Overal bed mobility: Needs Assistance Bed Mobility: Supine to Sit     Supine to sit: Mod assist, HOB elevated     General bed mobility comments: pt able to move bil LEs over rt EOB; began to pull his torso up from supine and required mod assist to do so and to scoot out to get feet to floor    Transfers Overall transfer level: Needs assistance Equipment used: 2 person hand held assist Transfers: Sit to/from Stand, Bed to  chair/wheelchair/BSC Sit to Stand: Min assist, +2 physical assistance   Step pivot transfers: Mod assist, +2 physical assistance       General transfer comment: assist for power up, rise, steady, and lateral stepping towards right with pivot to recliner on his right.    Ambulation/Gait                   Stairs             Wheelchair Mobility     Tilt Bed    Modified Rankin (Stroke Patients Only)       Balance Overall balance assessment: Needs assistance Sitting-balance support: No upper extremity supported, Feet supported Sitting balance-Leahy Scale: Poor   Postural control: Posterior lean Standing balance support: Bilateral upper extremity supported, During functional activity Standing balance-Leahy Scale: Poor                              Communication Communication Communication: Impaired Factors Affecting Communication: Difficulty expressing self;Reduced clarity of speech  Cognition Arousal: Alert Behavior During Therapy: Flat affect   PT - Cognitive impairments: Attention, Sequencing, Problem solving, Safety/Judgement                       PT - Cognition Comments: cues for sequencing for scooting out to EOB as pt could not sequence/problem-solve how to do it Following commands: Intact      Cueing Cueing Techniques: Verbal cues, Gestural cues  Exercises      General Comments General comments (skin integrity, edema, etc.): Wife present. Reports his trunk  strength was most effected by tumor (and pt with noted difficulty holding his torso forward/upright in sitting and standing      Pertinent Vitals/Pain Pain Assessment Pain Assessment: Faces Faces Pain Scale: No hurt    Home Living                          Prior Function            PT Goals (current goals can now be found in the care plan section) Acute Rehab PT Goals Patient Stated Goal: get better and resume chemotherapy/radiation Time For Goal  Achievement: 11/13/23 Potential to Achieve Goals: Good Progress towards PT goals: Progressing toward goals    Frequency    Min 1X/week      PT Plan      Co-evaluation              AM-PAC PT "6 Clicks" Mobility   Outcome Measure  Help needed turning from your back to your side while in a flat bed without using bedrails?: A Little Help needed moving from lying on your back to sitting on the side of a flat bed without using bedrails?: A Lot Help needed moving to and from a bed to a chair (including a wheelchair)?: Total Help needed standing up from a chair using your arms (e.g., wheelchair or bedside chair)?: A Lot Help needed to walk in hospital room?: Total Help needed climbing 3-5 steps with a railing? : Total 6 Click Score: 10    End of Session Equipment Utilized During Treatment: Gait belt Activity Tolerance: Patient tolerated treatment well Patient left: with call bell/phone within reach;in chair;with chair alarm set;with family/visitor present Nurse Communication: Mobility status;Need for lift equipment (go to his right; potential need for stedy) PT Visit Diagnosis: Other abnormalities of gait and mobility (R26.89);Muscle weakness (generalized) (M62.81)     Time: 5409-8119 PT Time Calculation (min) (ACUTE ONLY): 21 min  Charges:    $Neuromuscular Re-education: 8-22 mins PT General Charges $$ ACUTE PT VISIT: 1 Visit                      Jerolyn Center, PT Acute Rehabilitation Services  Office [phone]    Zena Amos 11/02/2023, 11:53 AM

## 2023-11-02 NOTE — Progress Notes (Signed)
 [facility]  PROGRESS NOTE  DANNA CASELLA FAO:[id number] DOB: 06-29-1946 DOA: 10/27/2023 PCP: Wanda Plump, MD   Brief narrative:    Patient is a 78 year old male who presented to [facility] 2/18 with witnessed seizure activity. PMHx significant for HTN, HLD, T2DM, B12 injections and glioblastoma (s/p craniotomy, XRT and chemotherapy, maintained on Temodar).  11/02/2023: Patient seen alongside patient's wife and sister-in-law.  Wife reports that some of the patient's antiepileptic medications may have been stopped prior to seizure.  Patient has remained stable.  Awaiting disposition.  Pursue acute rehab.   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  Acute metabolic encephalopathy: -Multifactorial; due to UTI, status epilepticus  -Continue treatment as above 11/02/2023: Complete course of antibiotics.  Urine cultures growing Pseudomonas.  Patient is currently on IV ceftazidime.  History of glioblastoma status postresection: -Decadron was tapered off as per Dr. Barbaraann Cao recommendation -Neurology has restarted back on Decadron as patient is more lethargic today -Continue with Decadron 4 mg IV twice daily -Decadron to be tapered as outpatient -Follow-up with neuro-oncology as outpatient after discharge  UTI: -Urine culture growing Pseudomonas aeruginosa -Continue ceftazidime 11/02/2023: Complete course of antibiotics.  Diabetes mellitus type 2: -Continue sliding scale insulin with NovoLog -CBG well-controlled  History of hypertension colon -Blood pressure well-controlled  Hypokalemia: -Replace potassium and follow BMP in am 11/02/2023: Potassium of 3.4 and magnesium of 1.3.  KCl 40 mEq Q4 hourly x 2 doses.  IV magnesium 4 g x 1.  Monitor renal panel and magnesium.  Hypomagnesemia: -Magnesium  is 1.4 -Replace magnesium and check mag level in a.m. 11/02/2023: Magnesium of 1.3 today.  IV magnesium 4 g x 1 dose.  Follow repeat level in the morning.  Disposition -Likely acute rehab.     Medications     Chlorhexidine Gluconate Cloth  6 each Topical Daily   dexamethasone (DECADRON) injection  4 mg Intravenous Q12H   docusate  100 mg Oral BID   enoxaparin (LOVENOX) injection  40 mg Subcutaneous Q24H   feeding supplement  237 mL Oral TID BM   insulin aspart  0-15 Units Subcutaneous Q4H   levETIRAcetam  1,000 mg Oral BID   multivitamin with minerals  1 tablet Oral Daily   mouth rinse  15 mL Mouth Rinse 4 times per day   polyethylene glycol  17 g Oral Daily   QUEtiapine  25 mg Oral QHS   thiamine  100 mg Oral Daily     Data Reviewed:   CBG:  Recent Labs  Lab 11/01/23 1610 11/01/23 2024 11/02/23 0028 11/02/23 0356 11/02/23 0729  GLUCAP 299* 382* 215* 123* 130*    SpO2: 97 % O2 Flow Rate (L/min): 2 L/min FiO2 (%): 30 %    Vitals:   11/01/23 2018 11/02/23 0026 11/02/23 0355 11/02/23 0729  BP: 117/70 129/83 130/82 130/86  Pulse: (!) 123 (!) 125 (!) 109 (!) 114  Resp:   18 18  Temp: 99.2 F (37.3 C) 99 F (37.2 C) 97.6 F (36.4 C) 97.9 F (36.6 C)  TempSrc: Axillary Axillary Axillary Oral  SpO2: 96% 97% 97% 97%  Weight:      Height:          Data Reviewed:  Basic Metabolic Panel: Recent Labs  Lab 10/28/23 0509 10/28/23 6962 10/28/23 1719 10/29/23 9528 10/29/23 1703 10/30/23 0506 10/31/23 4132 11/01/23 4401 11/02/23 0701  NA 134*  --   --  136  --  139 139 140 137  K 3.9  --   --  3.4*  --  3.1* 3.4* 3.4* 3.4*  CL 101  --   --  101  --  100 103 105 103  CO2 17*  --   --  22  --  25 25 25 26   GLUCOSE 243*  --   --  194*  --  172* 153* 163* 127*  BUN 14  --   --  14  --  17 12 15 14   CREATININE 1.07  --   --  1.15  --  0.75 0.76 0.85 0.82  CALCIUM 7.9*  --   --  7.9*  --  8.4* 8.4* 8.8* 8.7*  MG 1.9 1.8 1.4* 2.0 1.7  --  1.6* 1.4* 1.3*   PHOS 3.7 3.2 2.6 2.6 2.0*  --   --   --   --     CBC: Recent Labs  Lab 10/27/23 2005 10/27/23 2025 10/28/23 0509 10/29/23 0819  WBC  --  8.4 8.8 11.5*  NEUTROABS  --  3.3  --   --   HGB 12.6* 12.3* 11.4* 12.6*  HCT 37.0* 38.1* 34.8* 38.0*  MCV  --  95.5 92.1 90.9  PLT  --  245 225 212    LFT Recent Labs  Lab 10/27/23 2025  AST 25  ALT 18  ALKPHOS 103  BILITOT 0.7  PROT 6.6  ALBUMIN 3.1*     Antibiotics: Anti-infectives (From admission, onward)    Start     Dose/Rate Route Frequency Ordered Stop   10/30/23 1230  cefTAZidime (FORTAZ) 1 g in sodium chloride 0.9 % 100 mL IVPB        1 g 200 mL/hr over 30 Minutes Intravenous Every 8 hours 10/30/23 1139     10/28/23 1200  cefTRIAXone (ROCEPHIN) 2 g in sodium chloride 0.9 % 100 mL IVPB  Status:  Discontinued        2 g 200 mL/hr over 30 Minutes Intravenous Every 24 hours 10/28/23 0831 10/30/23 1138        DVT prophylaxis: Lovenox  Code Status: Full code  Family Communication: Discussed with patient's wife at bedside   CONSULTS    Subjective   Denies any complaints.  Much more alert today.   Objective    Physical Examination:  Appears in no acute distress S1-S2, regular, no murmur auscultated Lungs clear to auscultation bilaterally Abdomen is soft, nontender, no organomegaly  Status is: Inpatient:      Pressure Injury 10/27/23 Buttocks Posterior;Upper Stage 2 -  Partial thickness loss of dermis presenting as a shallow open injury with a red, pink wound bed without slough. (Active)  10/27/23 2300  Location: Buttocks  Location Orientation: Posterior;Upper  Staging: Stage 2 -  Partial thickness loss of dermis presenting as a shallow open injury with a red, pink wound bed without slough.  Wound Description (Comments):   Present on Admission: Yes    Time spent: 35 minutes.   Barnetta Chapel   [facility] If 7PM-7AM, please contact night-coverage at [email or web address], Office   [phone]   11/02/2023, 9:14 AM  LOS: 6 days

## 2023-11-02 NOTE — Plan of Care (Signed)

## 2023-11-02 NOTE — Progress Notes (Signed)
 Occupational Therapy Treatment Patient Details Name: Alex Church MRN: [id number] DOB: 05/08/1946 Today's Date: 11/02/2023   History of present illness 78 year old man who presented to [facility] 2/18 with witnessed seizure activity. ETT 2/18-2/20. PMHx significant for HTN, HLD, T2DM, B12 injections and glioblastoma (s/p craniotomy, XRT and chemotherapy, maintained on Temodar).   OT comments  Patient seen this session as a follow up to the eval.  Patient is more alert, is following commands much better, and is able to support himself at the edge of bed sitting with occasional assist to correct left lean.  Patient was able to transfer with Mod A, but noted left knee buckle.  ADL has progressed from bedlevel to sitting edge of bed.  Still needs at Mod A for upper body ADL and Max A for lower body ADL.  OT will continue efforts in the acute setting to address deficits listed below, and Patient will benefit from intensive inpatient follow-up therapy, >3 hours/day.       If plan is discharge home, recommend the following:  Assist for transportation;Assistance with feeding;A lot of help with bathing/dressing/bathroom;Two people to help with walking and/or transfers;Help with stairs or ramp for entrance   Equipment Recommendations  Wheelchair (measurements OT);Wheelchair cushion (measurements OT)    Recommendations for Other Services      Precautions / Restrictions Precautions Precautions: Fall Recall of Precautions/Restrictions: Impaired Restrictions Weight Bearing Restrictions Per Provider Order: No       Mobility Bed Mobility Overal bed mobility: Needs Assistance Bed Mobility: Sit to Sidelying         Sit to sidelying: Mod assist      Transfers Overall transfer level: Needs assistance Equipment used: 1 person hand held assist Transfers: Sit to/from Stand, Bed to chair/wheelchair/BSC Sit to Stand: Mod assist     Step pivot transfers: Mod assist           Balance Overall  balance assessment: Needs assistance Sitting-balance support: No upper extremity supported, Feet supported Sitting balance-Leahy Scale: Poor   Postural control: Left lateral lean Standing balance support: Bilateral upper extremity supported, During functional activity Standing balance-Leahy Scale: Poor                             ADL either performed or assessed with clinical judgement   ADL   Eating/Feeding: Bed level;Set up   Grooming: Wash/dry hands;Wash/dry face;Set up;Bed level           Upper Body Dressing : Maximal assistance;Sitting   Lower Body Dressing: Maximal assistance;Total assistance;Sitting/lateral leans   Toilet Transfer: Maximal assistance;Stand-pivot;BSC/3in1   Toileting- Clothing Manipulation and Hygiene: Total assistance              Extremity/Trunk Assessment Upper Extremity Assessment RUE Deficits / Details: 3+5 MMT grossly RUE Sensation: WNL RUE Coordination: WNL LUE Deficits / Details: 3/5 MMT LUE Sensation: WNL LUE Coordination: WNL   Lower Extremity Assessment Lower Extremity Assessment: Defer to PT evaluation   Cervical / Trunk Assessment Cervical / Trunk Assessment: Kyphotic    Vision   Vision Assessment?: No apparent visual deficits   Perception Perception Perception: Not tested   Praxis Praxis Praxis: Not tested   Communication Communication Communication: Impaired Factors Affecting Communication: Difficulty expressing self;Reduced clarity of speech   Cognition Arousal: Alert Behavior During Therapy: Flat affect Cognition: Cognition impaired   Orientation impairments: Person, Place, Situation   Memory impairment (select all impairments): Short-term memory Attention impairment (select first level of  impairment): Selective attention Executive functioning impairment (select all impairments): Problem solving, Sequencing                   Following commands: Intact        Cueing   Cueing  Techniques: Verbal cues, Gestural cues  Exercises      Shoulder Instructions       General Comments Wife present. Reports his trunk strength was most effected by tumor (and pt with noted difficulty holding his torso forward/upright in sitting and standing    Pertinent Vitals/ Pain       Pain Assessment Pain Assessment: No/denies pain                                                          Frequency  Min 1X/week        Progress Toward Goals  OT Goals(current goals can now be found in the care plan section)  Progress towards OT goals: Progressing toward goals  Acute Rehab OT Goals OT Goal Formulation: With patient Time For Goal Achievement: 11/13/23 Potential to Achieve Goals: Fair ADL Goals Pt Will Perform Eating: with set-up;sitting Pt Will Perform Grooming: with set-up;sitting Pt Will Perform Upper Body Dressing: with min assist;sitting Pt Will Transfer to Toilet: with min assist;stand pivot transfer;bedside commode  Plan      Co-evaluation                 AM-PAC OT "6 Clicks" Daily Activity     Outcome Measure   Help from another person eating meals?: A Little Help from another person taking care of personal grooming?: A Little Help from another person toileting, which includes using toliet, bedpan, or urinal?: A Lot Help from another person bathing (including washing, rinsing, drying)?: A Lot Help from another person to put on and taking off regular upper body clothing?: A Lot Help from another person to put on and taking off regular lower body clothing?: A Lot 6 Click Score: 14    End of Session Equipment Utilized During Treatment: Rolling walker (2 wheels)  OT Visit Diagnosis: Unsteadiness on feet (R26.81);Muscle weakness (generalized) (M62.81);History of falling (Z91.81)   Activity Tolerance Patient tolerated treatment well   Patient Left in bed;with call bell/phone within reach;with bed alarm set;with family/visitor  present   Nurse Communication Mobility status        Time: 1340-1403 OT Time Calculation (min): 23 min  Charges: OT General Charges $OT Visit: 1 Visit OT Treatments $Self Care/Home Management : 23-37 mins  11/02/2023  RP, OTR/L  Acute Rehabilitation Services  Office:  [phone]   Suzanna Obey 11/02/2023, 2:07 PM

## 2023-11-02 NOTE — TOC Progression Note (Signed)
 Transition of Care [facility]) - Progression Note    Patient Details  Name: Alex Church MRN: [id number] Date of Birth: 1946-06-04  Transition of Care [facility]) CM/SW Contact  Ronny Bacon, RN Phone Number: 11/02/2023, 1:33 PM  Clinical Narrative:    Potential CIR candidate. CIR following.   Expected Discharge Plan: Skilled Nursing Facility Barriers to Discharge: Continued Medical Work up  Expected Discharge Plan and Services In-house Referral: Clinical Social Work     Living arrangements for the past 2 months: Single Family Home                                       Social Determinants of Health (SDOH) Interventions SDOH Screenings   Food Insecurity: No Food Insecurity (10/31/2023)  Housing: Low Risk  (10/31/2023)  Transportation Needs: No Transportation Needs (10/31/2023)  Utilities: Not At Risk (10/31/2023)  Alcohol Screen: Low Risk  (10/21/2022)  Depression (PHQ2-9): Low Risk  (07/20/2023)  Financial Resource Strain: Low Risk  (07/13/2023)  Physical Activity: Insufficiently Active (07/13/2023)  Social Connections: Moderately Isolated (10/31/2023)  Stress: Stress Concern Present (07/13/2023)  Tobacco Use: Low Risk  (10/27/2023)  Health Literacy: Adequate Health Literacy (06/04/2023)    Readmission Risk Interventions    05/15/2023   11:43 AM  Readmission Risk Prevention Plan  Transportation Screening Complete  Home Care Screening Complete  Medication Review (RN CM) Complete

## 2023-11-02 NOTE — Inpatient Diabetes Management (Signed)
 Inpatient Diabetes Program Recommendations  AACE/ADA: New Consensus Statement on Inpatient Glycemic Control (2015)  Target Ranges:  Prepandial:   less than 140 mg/dL      Peak postprandial:   less than 180 mg/dL (1-2 hours)      Critically ill patients:  140 - 180 mg/dL   Lab Results  Component Value Date   GLUCAP 294 (H) 11/02/2023   HGBA1C 6.8 (H) 07/20/2023    Latest Reference Range & Units 11/01/23 08:32 11/01/23 11:33 11/01/23 16:10 11/01/23 20:24 11/02/23 00:28 11/02/23 03:56 11/02/23 07:29 11/02/23 13:05  Glucose-Capillary 70 - 99 mg/dL 130 (H) 865 (H) 784 (H) 382 (H) 215 (H) 123 (H) 130 (H) 294 (H)  (H): Data is abnormally high  Diabetes history: DM2 Outpatient Diabetes medications: Metformin 500 mg bid, Actos 45 mg Daily, Januvia 100 mg Daily  Current orders for Inpatient glycemic control: Novolog 0-15 units q 4 hrs. correction  Inpatient Diabetes Program Recommendations:   Please consider: -Add Semglee 12 units daily (0.15 units/kg x 81.6 kg) -Add Novolog 3 units tid meal coverage if eats 50%  Thank you, Darel Hong E. Delainey Winstanley, RN, MSN, CDCES  Diabetes Coordinator Inpatient Glycemic Control Team Team Pager [phone] (8am-5pm) 11/02/2023 1:24 PM

## 2023-11-02 NOTE — Care Management Important Message (Signed)
 Important Message  Patient Details  Name: Alex Church MRN: [id number] Date of Birth: [birth year]   Important Message Given:  Yes - Medicare IM     Dorena Bodo 11/02/2023, 3:19 PM

## 2023-11-02 NOTE — Progress Notes (Signed)

## 2023-11-03 DIAGNOSIS — C719 Malignant neoplasm of brain, unspecified: Secondary | ICD-10-CM

## 2023-11-03 DIAGNOSIS — G40901 Epilepsy, unspecified, not intractable, with status epilepticus: Secondary | ICD-10-CM | POA: Diagnosis not present

## 2023-11-03 DIAGNOSIS — F05 Delirium due to known physiological condition: Secondary | ICD-10-CM | POA: Diagnosis not present

## 2023-11-03 DIAGNOSIS — G9341 Metabolic encephalopathy: Secondary | ICD-10-CM

## 2023-11-03 LAB — URINALYSIS, COMPLETE (UACMP) WITH MICROSCOPIC
Bacteria, UA: NONE SEEN
Bilirubin Urine: NEGATIVE
Glucose, UA: NEGATIVE mg/dL
Ketones, ur: NEGATIVE mg/dL
Nitrite: NEGATIVE
Protein, ur: NEGATIVE mg/dL
Specific Gravity, Urine: 1.011 (ref 1.005–1.030)
WBC, UA: >50 WBC/hpf (ref 0–5)
pH: 8 (ref 5.0–8.0)

## 2023-11-03 LAB — GLUCOSE, CAPILLARY
Glucose-Capillary: 143 mg/dL — ABNORMAL HIGH (ref 70–99)
Glucose-Capillary: 186 mg/dL — ABNORMAL HIGH (ref 70–99)
Glucose-Capillary: 216 mg/dL — ABNORMAL HIGH (ref 70–99)
Glucose-Capillary: 292 mg/dL — ABNORMAL HIGH (ref 70–99)
Glucose-Capillary: 327 mg/dL — ABNORMAL HIGH (ref 70–99)
Glucose-Capillary: 395 mg/dL — ABNORMAL HIGH (ref 70–99)

## 2023-11-03 LAB — RENAL FUNCTION PANEL
Albumin: 2.2 g/dL — ABNORMAL LOW (ref 3.5–5.0)
Anion gap: 8 (ref 5–15)
BUN: 12 mg/dL (ref 8–23)
CO2: 25 mmol/L (ref 22–32)
Calcium: 8.6 mg/dL — ABNORMAL LOW (ref 8.9–10.3)
Chloride: 101 mmol/L (ref 98–111)
Creatinine, Ser: 0.65 mg/dL (ref 0.61–1.24)
GFR, Estimated: >60 mL/min (ref >60)
Glucose, Bld: 205 mg/dL — ABNORMAL HIGH (ref 70–99)
Phosphorus: 2.7 mg/dL (ref 2.5–4.6)
Potassium: 3.5 mmol/L (ref 3.5–5.1)
Sodium: 134 mmol/L — ABNORMAL LOW (ref 135–145)

## 2023-11-03 LAB — TROPONIN I (HIGH SENSITIVITY)
Troponin I (High Sensitivity): 150 ng/L (ref <18)
Troponin I (High Sensitivity): 171 ng/L (ref <18)

## 2023-11-03 LAB — MAGNESIUM: Magnesium: 1.5 mg/dL — ABNORMAL LOW (ref 1.7–2.4)

## 2023-11-03 LAB — D-DIMER, QUANTITATIVE: D-Dimer, Quant: 2.11 ug{FEU}/mL — ABNORMAL HIGH (ref 0.00–0.50)

## 2023-11-03 LAB — TRIGLYCERIDES: Triglycerides: 107 mg/dL (ref <150)

## 2023-11-03 MED ORDER — POTASSIUM CHLORIDE 20 MEQ PO PACK
40.0000 meq | PACK | Freq: Once | ORAL | Status: AC
  Administered 2023-11-03: 40 meq via ORAL
  Filled 2023-11-03: qty 2

## 2023-11-03 MED ORDER — QUETIAPINE FUMARATE 50 MG PO TABS
50.0000 mg | ORAL_TABLET | Freq: Every day | ORAL | Status: DC
  Administered 2023-11-03 – 2023-11-04 (×2): 50 mg via ORAL
  Filled 2023-11-03 (×2): qty 1

## 2023-11-03 MED ORDER — MAGNESIUM SULFATE 4 GM/100ML IV SOLN
4.0000 g | Freq: Once | INTRAVENOUS | Status: AC
Start: 2023-11-03 — End: 2023-11-03
  Administered 2023-11-03: 4 g via INTRAVENOUS
  Filled 2023-11-03: qty 100

## 2023-11-03 MED ORDER — POTASSIUM CHLORIDE IN NACL 20-0.9 MEQ/L-% IV SOLN
INTRAVENOUS | Status: AC
Start: 2023-11-03 — End: 2023-11-04
  Filled 2023-11-03: qty 1000

## 2023-11-03 NOTE — TOC Progression Note (Signed)
 Transition of Care [facility]) - Progression Note    Patient Details  Name: Alex Church MRN: [id number] Date of Birth: [birth year]  Transition of Care [facility]) CM/SW Contact  Baldemar Lenis, [place] Phone Number: 11/03/2023, 3:47 PM  Clinical Narrative:   CSW updated by Rehab Admissions that patient will not be a candidate, needs SNF placement. CSW met with patient's spouse, Alex Church, at bedside to discuss SNF placement. Alex Church said that she was not told that he would not be a CIR candidate and wanted to speak with CIR for more information. CSW relayed request, CIR Admissions followed back up with her.   Spouse is in agreement with looking at SNF, but does not want placement in [facility]. Spouse asked about [facility], [facility], or [facility], or somewhere else in [place] that would be good. CSW completed referral and sent out, asked for review. [facility] has declined, but [facility] and [facility] can offer a bed when available; earliest bed likely available on Thursday. Spouse also asked about [facility], CSW sent referral and awaiting response. CSW to follow.    Expected Discharge Plan: Skilled Nursing Facility Barriers to Discharge: Continued Medical Work up, English as a second language teacher  Expected Discharge Plan and Services In-house Referral: Clinical Social Work     Living arrangements for the past 2 months: Single Family Home                                       Social Determinants of Health (SDOH) Interventions SDOH Screenings   Food Insecurity: No Food Insecurity (10/31/2023)  Housing: Low Risk  (10/31/2023)  Transportation Needs: No Transportation Needs (10/31/2023)  Utilities: Not At Risk (10/31/2023)  Alcohol Screen: Low Risk  (10/21/2022)  Depression (PHQ2-9): Low Risk  (07/20/2023)  Financial Resource Strain: Low Risk  (07/13/2023)  Physical Activity: Insufficiently Active (07/13/2023)  Social Connections: Moderately Isolated (10/31/2023)  Stress: Stress Concern Present  (07/13/2023)  Tobacco Use: Low Risk  (10/27/2023)  Health Literacy: Adequate Health Literacy (06/04/2023)    Readmission Risk Interventions    05/15/2023   11:43 AM  Readmission Risk Prevention Plan  Transportation Screening Complete  Home Care Screening Complete  Medication Review (RN CM) Complete

## 2023-11-03 NOTE — Progress Notes (Signed)
 Inpatient Rehab Admissions Coordinator:   I met with Pt. And wife regarding potential CIR admit. She states she is interested but she still works  and cannot afford private duty caregivers. CIR would require a home discharge plan so I cannot offer Pt. A bed at this time. He will need SNF placement. TOC notified.   Megan Salon, MS, CCC-SLP Rehab Admissions Coordinator  [phone] (celll) [phone] (office)

## 2023-11-03 NOTE — Progress Notes (Signed)
 [facility]  PROGRESS NOTE  Alex Church JXB:[id number] DOB: [birth year] DOA: 10/27/2023 PCP: Wanda Plump, MD   Brief narrative:    Patient is a 78 year old male who presented to [facility] 2/18 with witnessed seizure activity. PMHx significant for HTN, HLD, T2DM, B12 injections and glioblastoma (s/p craniotomy, XRT and chemotherapy, maintained on Temodar).  11/02/2023: Patient seen alongside patient's wife and sister-in-law.  Wife reports that some of the patient's antiepileptic medications may have been stopped prior to seizure.  Patient has remained stable.  Awaiting disposition.  Pursue acute rehab.  11/03/2023: Patient seen alongside patient's wife.  Patient remains stable.  No chest pain or shortness of breath.  Tachycardia persists.  EKG reveals ST-T wave changes.  Will obtain troponins.  Will also check D-dimer.  Will hydrate patient.  As documented above, patient does not report chest pain or shortness of breath.   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 11/03/2023: Stable.  Acute metabolic encephalopathy: -Multifactorial; due to UTI, status epilepticus  -Continue treatment as above 11/02/2023: Complete course of antibiotics.  Urine cultures growing Pseudomonas.  Patient is currently on IV ceftazidime. 11/03/2023: Resolved.  History of glioblastoma status postresection: -Decadron was tapered off as per Dr. Barbaraann Cao recommendation -Neurology has restarted back on Decadron as patient is more lethargic today -Continue with Decadron 4 mg IV twice daily -Decadron to be tapered as outpatient -Follow-up with neuro-oncology as outpatient after discharge  UTI: -Urine culture growing Pseudomonas aeruginosa -Continue ceftazidime 11/02/2023: Complete course of antibiotics. 11/03/2023: Complete 5-day  course of IV ceftazidime.  Diabetes mellitus type 2: -Continue sliding scale insulin with NovoLog -CBG well-controlled  History of hypertension colon -Blood pressure well-controlled  Hypokalemia: -Replace potassium and follow BMP in am 11/02/2023: Potassium of 3.4 and magnesium of 1.3.  KCl 40 mEq Q4 hourly x 2 doses.  IV magnesium 4 g x 1.  Monitor renal panel and magnesium. 11/03/2023: Potassium of 3.5 and magnesium of 1.5.  KCl 46M EQ x 1 dose orally.  IV magnesium 4 g x 1 dose.  A.m. labs.  Hypomagnesemia: -Magnesium is 1.4 -Replace magnesium and check mag level in a.m. 11/02/2023: Magnesium of 1.3 today.  IV magnesium 4 g x 1 dose.  Follow repeat level in the morning. 11/03/2023: IV magnesium 4 g x 1 dose.  Sinus tachycardia: -Patient looks volume depleted. -Hydrate patient. -Correct abnormal electrolytes. -EKG reveals ST-T wave changes. -Check troponin and D-dimer. -No chest pain or shortness of breath reported. -History of glioblastoma-noted.  Volume depletion: -Cautiously hydrate patient.  Disposition -Likely acute rehab.     Medications     dexamethasone (DECADRON) injection  4 mg Intravenous Q12H   docusate  100 mg Oral BID   enoxaparin (LOVENOX) injection  40 mg Subcutaneous Q24H   feeding supplement  237 mL Oral TID BM   insulin aspart  0-15 Units Subcutaneous Q4H   levETIRAcetam  1,000 mg Oral BID   multivitamin with minerals  1 tablet Oral Daily   mouth rinse  15 mL Mouth Rinse 4 times per day   polyethylene glycol  17 g Oral Daily   QUEtiapine  50 mg Oral QHS     Data Reviewed:   CBG:  Recent Labs  Lab 11/02/23 2317 11/03/23 0431 11/03/23 0728 11/03/23 1059 11/03/23 1555  GLUCAP 229* 186* 143* 216* 327*    SpO2: 96 % O2  Flow Rate (L/min): 2 L/min FiO2 (%): 30 %    Vitals:   11/03/23 0500 11/03/23 0726 11/03/23 1058 11/03/23 1554  BP:  128/80 117/73 107/60  Pulse:  (!) 119 (!) 121 (!) 108  Resp:  17 17 17   Temp:  98.3 F (36.8 C)  98 F (36.7 C) 98.1 F (36.7 C)  TempSrc:  Oral Oral Oral  SpO2:  96% 96% 96%  Weight: 79.4 kg     Height:          Data Reviewed:  Basic Metabolic Panel: Recent Labs  Lab 10/28/23 0942 10/28/23 1719 10/29/23 0819 10/29/23 1703 10/30/23 0506 10/31/23 0712 11/01/23 0632 11/02/23 0701 11/03/23 0545  NA  --   --  136  --  139 139 140 137 134*  K  --   --  3.4*  --  3.1* 3.4* 3.4* 3.4* 3.5  CL  --   --  101  --  100 103 105 103 101  CO2  --   --  22  --  25 25 25 26 25   GLUCOSE  --   --  194*  --  172* 153* 163* 127* 205*  BUN  --   --  14  --  17 12 15 14 12   CREATININE  --   --  1.15  --  0.75 0.76 0.85 0.82 0.65  CALCIUM  --   --  7.9*  --  8.4* 8.4* 8.8* 8.7* 8.6*  MG 1.8 1.4* 2.0 1.7  --  1.6* 1.4* 1.3* 1.5*  PHOS 3.2 2.6 2.6 2.0*  --   --   --   --  2.7    CBC: Recent Labs  Lab 10/27/23 2005 10/27/23 2025 10/28/23 0509 10/29/23 0819  WBC  --  8.4 8.8 11.5*  NEUTROABS  --  3.3  --   --   HGB 12.6* 12.3* 11.4* 12.6*  HCT 37.0* 38.1* 34.8* 38.0*  MCV  --  95.5 92.1 90.9  PLT  --  245 225 212    LFT Recent Labs  Lab 10/27/23 2025 11/03/23 0545  AST 25  --   ALT 18  --   ALKPHOS 103  --   BILITOT 0.7  --   PROT 6.6  --   ALBUMIN 3.1* 2.2*     Antibiotics: Anti-infectives (From admission, onward)    Start     Dose/Rate Route Frequency Ordered Stop   10/30/23 1230  cefTAZidime (FORTAZ) 1 g in sodium chloride 0.9 % 100 mL IVPB        1 g 200 mL/hr over 30 Minutes Intravenous Every 8 hours 10/30/23 1139     10/28/23 1200  cefTRIAXone (ROCEPHIN) 2 g in sodium chloride 0.9 % 100 mL IVPB  Status:  Discontinued        2 g 200 mL/hr over 30 Minutes Intravenous Every 24 hours 10/28/23 0831 10/30/23 1138        DVT prophylaxis: Lovenox  Code Status: Full code  Family Communication: Discussed with patient's wife at bedside   CONSULTS    Subjective   Denies any complaints.  Much more alert today.   Objective    Physical  Examination:  Appears in no acute distress S1-S2, regular, no murmur auscultated Lungs clear to auscultation bilaterally Abdomen is soft, nontender, no organomegaly  Status is: Inpatient:      Pressure Injury 10/27/23 Buttocks Posterior;Upper Stage 2 -  Partial thickness loss of dermis presenting as a shallow open  injury with a red, pink wound bed without slough. (Active)  10/27/23 2300  Location: Buttocks  Location Orientation: Posterior;Upper  Staging: Stage 2 -  Partial thickness loss of dermis presenting as a shallow open injury with a red, pink wound bed without slough.  Wound Description (Comments):   Present on Admission: Yes    Time spent: 35 minutes.   Barnetta Chapel   [facility] If 7PM-7AM, please contact night-coverage at [email or web address], Office  [phone]   11/03/2023, 7:06 PM  LOS: 7 days

## 2023-11-03 NOTE — Plan of Care (Signed)
 Mentation and mobility getting better. Tachycardia today, restarted on IVF, and urine sample sent. Dispo CIR VS SNF   Problem: Health Behavior/Discharge Planning: Goal: Ability to manage health-related needs will improve Outcome: Progressing   Problem: Activity: Goal: Risk for activity intolerance will decrease Outcome: Progressing   Problem: Elimination: Goal: Will not experience complications related to bowel motility Outcome: Progressing Goal: Will not experience complications related to urinary retention Outcome: Progressing   Problem: Pain Managment: Goal: General experience of comfort will improve and/or be controlled Outcome: Progressing   Problem: Skin Integrity: Goal: Risk for impaired skin integrity will decrease Outcome: Progressing   Problem: Safety: Goal: Ability to remain free from injury will improve Outcome: Progressing   Problem: Tissue Perfusion: Goal: Adequacy of tissue perfusion will improve Outcome: Progressing

## 2023-11-03 NOTE — Progress Notes (Signed)
 Speech Language Pathology Treatment: Dysphagia  Patient Details Name: AVONTE SENSABAUGH MRN: [id number] DOB: 1946-01-13 Today's Date: 11/03/2023 Time: 1914-7829 SLP Time Calculation (min) (ACUTE ONLY): 18 min  Assessment / Plan / Recommendation Clinical Impression  Pt fed himself trials of nectar thick liquids and purees using a slow, controlled rate given Min verbal cueing from SLP and his wife. No s/s of aspiration were observed throughout trials. Provided education to pt and his wife re: using cued throat clearance, multiple swallows, and a slow rate at meal times, with which they verbalize agreement. Recommend upgrading diet to Dys 1 solids and continuing nectar thick liquids via cup only (no straws). Will continue to f/u.   HPI HPI: 78 year old man who presented to [facility] 2/18 with witnessed seizure activity. MRI Brain severely motion degraded. Repeat [facility] 2/22 shows no acute intracranial abnormality.  ETT 2/18-2/20. PMHx significant for HTN, HLD, T2DM, B12 injections and glioblastoma (s/p craniotomy, XRT and chemotherapy, maintained on Temodar). Wife reports pain in chest after swallowing that they attributed to reflux.      SLP Plan  Continue with current plan of care      Recommendations for follow up therapy are one component of a multi-disciplinary discharge planning process, led by the attending physician.  Recommendations may be updated based on patient status, additional functional criteria and insurance authorization.    Recommendations  Diet recommendations: Dysphagia 1 (puree);Nectar-thick liquid Liquids provided via: Cup;No straw Medication Administration: Crushed with puree Supervision: Staff to assist with self feeding;Full supervision/cueing for compensatory strategies;Trained caregiver to feed patient Compensations: Minimize environmental distractions;Slow rate;Small sips/bites;Multiple dry swallows after each bite/sip Postural Changes and/or Swallow Maneuvers: Seated upright  90 degrees                  Oral care BID   Frequent or constant Supervision/Assistance Dysphagia, oropharyngeal phase (R13.12)     Continue with current plan of care     Gwynneth Aliment, M.A., CF-SLP Speech Language Pathology, Acute Rehabilitation Services  Secure Chat preferred [phone]   11/03/2023, 3:00 PM

## 2023-11-03 NOTE — Inpatient Diabetes Management (Signed)
 Inpatient Diabetes Program Recommendations  AACE/ADA: New Consensus Statement on Inpatient Glycemic Control   Target Ranges:  Prepandial:   less than 140 mg/dL      Peak postprandial:   less than 180 mg/dL (1-2 hours)      Critically ill patients:  140 - 180 mg/dL    Latest Reference Range & Units 11/03/23 04:31 11/03/23 07:28 11/03/23 10:59  Glucose-Capillary 70 - 99 mg/dL 096 (H) 045 (H) 409 (H)    Latest Reference Range & Units 11/02/23 07:29 11/02/23 13:05 11/02/23 17:51 11/02/23 20:27 11/02/23 23:17  Glucose-Capillary 70 - 99 mg/dL 811 (H) 914 (H) 782 (H) 283 (H) 229 (H)   Review of Glycemic Control  Diabetes history: DM2 Outpatient Diabetes medications: Metformin 500 mg bid, Actos 45 mg Daily, Januvia 100 mg Daily  Current orders for Inpatient glycemic control: Novolog 0-15 units Q4H; Decadron 4 mg Q12H  Inpatient Diabetes Program Recommendations:    Insulin: If steroids are continued as ordered, please consider ordering Semglee 10 units Q24H and Novolog 3 units TID with meals for meal coverage if patient eats at least 50% of meals.  Thanks, Orlando Penner, RN, MSN, CDCES Diabetes Coordinator Inpatient Diabetes Program [phone] (Team Pager from 8am to 5pm)

## 2023-11-03 NOTE — Progress Notes (Signed)
 Physical Therapy Treatment Patient Details Name: Alex Church MRN: [id number] DOB: 09/09/1945 Today's Date: 11/03/2023   History of Present Illness 78 year old man who presented to [facility] 2/18 with witnessed seizure activity. ETT 2/18-2/20. PMHx significant for HTN, HLD, T2DM, B12 injections and glioblastoma (s/p craniotomy, XRT and chemotherapy, maintained on Temodar).    PT Comments  Pt resting in bed on arrival and agreeable to session with good progress towards acute goals. Pt able to progress gait this session with RW for support and mod A to maintain balance as pt with flexed trunk and short shuffling steps needing near constant cues to correct. Pt continues to be limited in safe mobility by decreased activity tolerance, decreased trunk control, weakness, and poor balance/postural reactions. Current plan remains appropriate to address deficits and maximize functional independence and decrease caregiver burden. Pt continues to benefit from skilled PT services to progress toward functional mobility goals.      If plan is discharge home, recommend the following: A lot of help with walking and/or transfers;A lot of help with bathing/dressing/bathroom;Direct supervision/assist for medications management;Direct supervision/assist for financial management;Assist for transportation;Help with stairs or ramp for entrance;Supervision due to cognitive status   Can travel by private vehicle        Equipment Recommendations  None recommended by PT    Recommendations for Other Services       Precautions / Restrictions Precautions Precautions: Fall Recall of Precautions/Restrictions: Impaired Restrictions Weight Bearing Restrictions Per Provider Order: No     Mobility  Bed Mobility Overal bed mobility: Needs Assistance Bed Mobility: Supine to Sit     Supine to sit: HOB elevated, Min assist     General bed mobility comments: min A to elevate trunk and cues to scoot out to EOB     Transfers Overall transfer level: Needs assistance Equipment used: Rolling walker (2 wheels) Transfers: Sit to/from Stand, Bed to chair/wheelchair/BSC Sit to Stand: Min assist           General transfer comment: assist for power up and steadying    Ambulation/Gait Ambulation/Gait assistance: Mod assist Gait Distance (Feet): 45 Feet Assistive device: Rolling walker (2 wheels) Gait Pattern/deviations: Step-through pattern, Decreased stride length, Shuffle, Narrow base of support, Trunk flexed Gait velocity: decr     General Gait Details: shuffling steps with trunk flexed needing near constant cues for closer RW proximity and upright trunk and larger step lengths   Stairs             Wheelchair Mobility     Tilt Bed    Modified Rankin (Stroke Patients Only)       Balance Overall balance assessment: Needs assistance Sitting-balance support: No upper extremity supported, Feet supported Sitting balance-Leahy Scale: Poor Sitting balance - Comments: able to maintain with cues for upright trunk   Standing balance support: Bilateral upper extremity supported, During functional activity Standing balance-Leahy Scale: Poor Standing balance comment: heavy reliance on UE supprot                            Communication Communication Communication: Impaired Factors Affecting Communication: Difficulty expressing self;Reduced clarity of speech  Cognition Arousal: Alert Behavior During Therapy: Flat affect   PT - Cognitive impairments: Attention, Sequencing, Problem solving, Safety/Judgement                         Following commands: Intact      Cueing Cueing Techniques: Verbal  cues, Gestural cues  Exercises      General Comments        Pertinent Vitals/Pain Pain Assessment Pain Assessment: No/denies pain    Home Living                          Prior Function            PT Goals (current goals can now be found in  the care plan section) Acute Rehab PT Goals Patient Stated Goal: get better and resume chemotherapy/radiation PT Goal Formulation: With patient Time For Goal Achievement: 11/13/23 Progress towards PT goals: Progressing toward goals    Frequency    Min 1X/week      PT Plan      Co-evaluation              AM-PAC PT "6 Clicks" Mobility   Outcome Measure  Help needed turning from your back to your side while in a flat bed without using bedrails?: A Little Help needed moving from lying on your back to sitting on the side of a flat bed without using bedrails?: A Lot Help needed moving to and from a bed to a chair (including a wheelchair)?: A Lot Help needed standing up from a chair using your arms (e.g., wheelchair or bedside chair)?: A Little Help needed to walk in hospital room?: A Lot Help needed climbing 3-5 steps with a railing? : Total 6 Click Score: 13    End of Session Equipment Utilized During Treatment: Gait belt Activity Tolerance: Patient tolerated treatment well Patient left: with call bell/phone within reach;in chair;with chair alarm set;with family/visitor present Nurse Communication: Mobility status PT Visit Diagnosis: Other abnormalities of gait and mobility (R26.89);Muscle weakness (generalized) (M62.81)     Time: 1610-9604 PT Time Calculation (min) (ACUTE ONLY): 19 min  Charges:    $Gait Training: 8-22 mins PT General Charges $$ ACUTE PT VISIT: 1 Visit                     Jessey Huyett R. PTA Acute Rehabilitation Services Office: [phone]   Catalina Antigua 11/03/2023, 4:18 PM

## 2023-11-03 NOTE — Consult Note (Signed)
 Physical Medicine and Rehabilitation Consult Reason for Consult:debility after seizure Referring Physician: Dartha Lodge   HPI: Alex Church is a 78 y.o. male with a history of glioblastoma s/p crani (admitted to inpatient rehab 05/15/23), XRT, currently on temodar who presented on 2/18 with witnessed seizure, lethargy. Question of whether some of seizure medications were stopped prior to event. CT of head without acute changes. Pt on Keppra 1g bid for prophylaxis. Course complicated by confusion/encephalopathy d/t pseudomonas UTI, pt currently on ceftazidime. Pt's decadron was resumed and pt is currently on 4mg  IV bid. Plan is for taper again as outpt. Other medical issues include uncontrolled diabetes due to steroids, hypokalemia and hypomagnesemia requiring replacement. Pt was up with therapy yesterday and was min to mod assist for sit-std transfers. Pt stood/pivoted but hasn't ambulated yet. He is max assist for ADL's. Pt lives with spouse in two level home with stair lift. He was using a RW for mobility prior to this admit, and recently he was requiring some assist for self-care activities in the days before admit. Prior to that his wife states he was doing them on his own.    Review of Systems  Unable to perform ROS: Mental acuity   Past Medical History:  Diagnosis Date   B12 deficiency    monthly shots   Diabetes mellitus    Glaucoma suspect    Glioblastoma (HCC)    Hyperlipemia    Hypertension    Past Surgical History:  Procedure Laterality Date   APPLICATION OF CRANIAL NAVIGATION Right 05/12/2023   Procedure: APPLICATION OF CRANIAL NAVIGATION;  Surgeon: Dawley, Alan Mulder, DO;  Location: [facility] OR;  Service: Neurosurgery;  Laterality: Right;   CRANIOTOMY Right 05/12/2023   Procedure: RIGHT STERIOSTACTIC FRONTAL CRANIOTOMY FOR TUMOR RESECTION;  Surgeon: Bethann Goo, DO;  Location: [facility] OR;  Service: Neurosurgery;  Laterality: Right;   Family History  Problem Relation Age of Onset    Diabetes Maternal Uncle    Heart attack Brother 46   Cancer Mother        intra-abdominal   Cirrhosis Cousin        maternal   Diabetes Cousin        maternal   Stroke Neg Hx    Colon cancer Neg Hx    Prostate cancer Neg Hx    Social History:  reports that he has never smoked. He has never used smokeless tobacco. He reports that he does not drink alcohol and does not use drugs. Allergies: No Known Allergies Medications Prior to Admission  Medication Sig Dispense Refill   acetaminophen (TYLENOL) 500 MG tablet Take 1,000 mg by mouth every 6 (six) hours as needed for mild pain (pain score 1-3) (Tries to only take twice a week).     allopurinol (ZYLOPRIM) 100 MG tablet Take 1 tablet (100 mg total) by mouth 2 (two) times daily. 180 tablet 1   atorvastatin (LIPITOR) 10 MG tablet Take 1 tablet (10 mg total) by mouth at bedtime. 90 tablet 1   carvedilol (COREG) 12.5 MG tablet Take 0.5 tablets (6.25 mg total) by mouth 2 (two) times daily with a meal. 90 tablet 1   cloNIDine (CATAPRES) 0.1 MG tablet Take 1 tablet (0.1 mg total) by mouth 2 (two) times daily. 180 tablet 1   levETIRAcetam (KEPPRA) 500 MG tablet Take 1 tablet (500 mg total) by mouth 2 (two) times daily. 60 tablet 3   lisinopril (ZESTRIL) 20 MG tablet Take 20 mg by mouth  at bedtime.     melatonin 5 MG TABS Take 1 tablet (5 mg total) by mouth at bedtime. (Patient taking differently: Take 5 mg by mouth at bedtime as needed (sleep).) 30 tablet 0   metFORMIN (GLUCOPHAGE) 1000 MG tablet Take 1 tablet (1,000 mg total) by mouth 2 (two) times daily with a meal. (Patient taking differently: Take 500 mg by mouth 2 (two) times daily with a meal.) 180 tablet 1   ondansetron (ZOFRAN) 8 MG tablet Take 1 tablet (8 mg total) by mouth every 8 (eight) hours as needed for nausea or vomiting. May take 30-60 minutes prior to Temodar administration if nausea/vomiting occurs as needed. 30 tablet 1   pantoprazole (PROTONIX) 40 MG tablet Take 1 tablet (40 mg  total) by mouth daily. 30 tablet 1   pioglitazone (ACTOS) 45 MG tablet Take 1 tablet (45 mg total) by mouth daily. 90 tablet 1   QUEtiapine (SEROQUEL) 25 MG tablet Take 1 tablet (25 mg total) by mouth at bedtime. 30 tablet 1   senna (SENOKOT) 8.6 MG TABS tablet Take 1 tablet by mouth daily as needed for mild constipation or moderate constipation.     sitaGLIPtin (JANUVIA) 100 MG tablet Take 1 tablet (100 mg total) by mouth daily. 90 tablet 1   tamsulosin (FLOMAX) 0.4 MG CAPS capsule Take 1 capsule (0.4 mg total) by mouth daily after supper. 90 capsule 1   temozolomide (TEMODAR) 100 MG capsule Take 3 capsules (300 mg total) by mouth at bedtime. Take for 5 days on, then off for 23 days. Repeat every 28 days. Take on an empty stomach to decrease nausea & vomiting. 15 capsule 0   baclofen (LIORESAL) 10 MG tablet Take 1/2 tablet (5mg ) by mouth 3 (three) times daily. As needed for hiccups (Patient not taking: Reported on 10/28/2023) 45 tablet 1   [EXPIRED] clotrimazole-betamethasone (LOTRISONE) cream Apply 1 Application topically daily for 14 days. (Patient not taking: Reported on 10/28/2023) 14 g 0   Colchicine 0.6 MG CAPS Take 1 capsule (0.6 mg total) by mouth 2 (two) times daily as needed. (Patient not taking: Reported on 10/28/2023) 180 capsule 0   [EXPIRED] dexamethasone (DECADRON) 1 MG tablet Take 3 tablets (3 mg total) by mouth daily with breakfast for 7 days, THEN 2 tablets (2 mg total) daily with breakfast for 7 days, THEN 1 tablet (1 mg total) daily with breakfast for 7 days. (Patient not taking: Reported on 10/28/2023) 42 tablet 0   glucose blood test strip Check blood sugars once daily 100 each 12   Lancets (ONETOUCH ULTRASOFT) lancets Check bloods sugars once daily 100 each 12   neomycin-polymyxin b-dexamethasone (MAXITROL) 3.5-10000-0.1 OINT  (Patient not taking: Reported on 10/28/2023)     sodium chloride 1 g tablet Take 1 tablet (1 g total) by mouth 3 (three) times daily with meals. (Patient not  taking: Reported on 10/28/2023) 90 tablet 0    Home: Home Living Family/patient expects to be discharged to:: Private residence Living Arrangements: Spouse/significant other Available Help at Discharge: Family, Available 24 hours/day Type of Home: House Home Access: Stairs to enter [employer] of Steps: 3 Entrance Stairs-Rails: None Home Layout: Two level Alternate Level Stairs-Number of Steps: 12 (has a stair lift) Alternate Level Stairs-Rails: Can reach both Bathroom Shower/Tub: Health visitor: Standard Bathroom Accessibility: No Home Equipment: Agricultural consultant (2 wheels), Cane - single point, Rollator (4 wheels)  Functional History: Prior Function Prior Level of Function : Needs assist Mobility Comments: using RW, multiple  falls ADLs Comments: assist with dressing, bathing, cooking, cleaning as of recently; pt's wife states he was more independent after finishing AIR and OPPT but then started falling Functional Status:  Mobility: Bed Mobility Overal bed mobility: Needs Assistance Bed Mobility: Sit to Sidelying Supine to sit: Mod assist, HOB elevated Sit to supine: Mod assist, +2 for physical assistance Sit to sidelying: Mod assist General bed mobility comments: pt able to move bil LEs over rt EOB; began to pull his torso up from supine and required mod assist to do so and to scoot out to get feet to floor Transfers Overall transfer level: Needs assistance Equipment used: 1 person hand held assist Transfers: Sit to/from Stand, Bed to chair/wheelchair/BSC Sit to Stand: Mod assist Bed to/from chair/wheelchair/BSC transfer type:: Step pivot Step pivot transfers: Mod assist General transfer comment: assist for power up, rise, steady, and lateral stepping towards right with pivot to recliner on his right.      ADL: ADL Overall ADL's : Needs assistance/impaired Eating/Feeding: Bed level, Set up Grooming: Wash/dry hands, Wash/dry face, Set up, Bed  level Upper Body Bathing: Maximal assistance, Sitting, Bed level Lower Body Bathing: Total assistance, Bed level Upper Body Dressing : Maximal assistance, Sitting Lower Body Dressing: Maximal assistance, Total assistance, Sitting/lateral leans Toilet Transfer: Maximal assistance, Stand-pivot, BSC/3in1 Toileting- Clothing Manipulation and Hygiene: Total assistance  Cognition: Cognition Orientation Level: Oriented to person Cognition Arousal: Alert Behavior During Therapy: Flat affect  Blood pressure 128/80, pulse (!) 119, temperature 98.3 F (36.8 C), temperature source Oral, resp. rate 17, height 6' (1.829 m), weight 79.4 kg, SpO2 96%. Physical Exam Constitutional:      Appearance: He is ill-appearing.     Comments: Confused, somewhat lethargic  HENT:     Right Ear: External ear normal.     Left Ear: External ear normal.     Nose: Nose normal.     Mouth/Throat:     Mouth: Mucous membranes are moist.  Eyes:     Conjunctiva/sclera: Conjunctivae normal.  Cardiovascular:     Rate and Rhythm: Tachycardia present.  Pulmonary:     Effort: Pulmonary effort is normal.  Abdominal:     Palpations: Abdomen is soft.  Musculoskeletal:        General: No swelling or tenderness. Normal range of motion.     Cervical back: Normal range of motion.  Skin:    General: Skin is warm.     Findings: Bruising present.  Neurological:     Comments: Pt oriented to self, hospital. Could follow basic one step commands. Made eye contact when cued. Speech slurred, low volume. Distracted. No focal CN abnl. MMT: 3+ to 4+/5 prox to distal in UE's. LE: 3/5 HF, 3+ KE and 4/5 ADF/PF. No focal sensory abnl. DTR's 1+ to trace. No abnl resting tone  Psychiatric:     Comments: Visual hallucinations     Results for orders placed or performed during the hospital encounter of 10/27/23 (from the past 24 hours)  Glucose, capillary     Status: Abnormal   Collection Time: 11/02/23  1:05 PM  Result Value Ref Range    Glucose-Capillary 294 (H) 70 - 99 mg/dL   Comment 1 Notify RN   Glucose, capillary     Status: Abnormal   Collection Time: 11/02/23  5:51 PM  Result Value Ref Range   Glucose-Capillary 336 (H) 70 - 99 mg/dL  Glucose, capillary     Status: Abnormal   Collection Time: 11/02/23  8:27 PM  Result  Value Ref Range   Glucose-Capillary 283 (H) 70 - 99 mg/dL   Comment 1 Notify RN   Glucose, capillary     Status: Abnormal   Collection Time: 11/02/23 11:17 PM  Result Value Ref Range   Glucose-Capillary 229 (H) 70 - 99 mg/dL   Comment 1 Notify RN   Glucose, capillary     Status: Abnormal   Collection Time: 11/03/23  4:31 AM  Result Value Ref Range   Glucose-Capillary 186 (H) 70 - 99 mg/dL   Comment 1 Notify RN   Triglycerides     Status: None   Collection Time: 11/03/23  5:45 AM  Result Value Ref Range   Triglycerides 107 <150 mg/dL  Renal function panel     Status: Abnormal   Collection Time: 11/03/23  5:45 AM  Result Value Ref Range   Sodium 134 (L) 135 - 145 mmol/L   Potassium 3.5 3.5 - 5.1 mmol/L   Chloride 101 98 - 111 mmol/L   CO2 25 22 - 32 mmol/L   Glucose, Bld 205 (H) 70 - 99 mg/dL   BUN 12 8 - 23 mg/dL   Creatinine, Ser 1.61 0.61 - 1.24 mg/dL   Calcium 8.6 (L) 8.9 - 10.3 mg/dL   Phosphorus 2.7 2.5 - 4.6 mg/dL   Albumin 2.2 (L) 3.5 - 5.0 g/dL   GFR, Estimated >09 >60 mL/min   Anion gap 8 5 - 15  Magnesium     Status: Abnormal   Collection Time: 11/03/23  5:45 AM  Result Value Ref Range   Magnesium 1.5 (L) 1.7 - 2.4 mg/dL  Glucose, capillary     Status: Abnormal   Collection Time: 11/03/23  7:28 AM  Result Value Ref Range   Glucose-Capillary 143 (H) 70 - 99 mg/dL   Comment 1 Notify RN    No results found.  Assessment/Plan: Diagnosis: 78 yo male with hx of glioblastoma who is now encephalopathic after a seizure and UTI.  Does the need for close, 24 hr/day medical supervision in concert with the patient's rehab needs make it unreasonable for this patient to be served  in a less intensive setting? Yes Co-Morbidities requiring supervision/potential complications:  -glioblastoma sx mgt, steroids -altered sleep-wake cycle with sundowning -seizure rx -nutrition and electrolyte management -UTI rx Due to bladder management, bowel management, safety, skin/wound care, disease management, medication administration, pain management, and patient education, does the patient require 24 hr/day rehab nursing? Yes Does the patient require coordinated care of a physician, rehab nurse, therapy disciplines of PT, OT, SLP to address physical and functional deficits in the context of the above medical diagnosis(es)? Yes Addressing deficits in the following areas: balance, endurance, locomotion, strength, transferring, bowel/bladder control, bathing, dressing, feeding, grooming, toileting, cognition, speech, and psychosocial support Can the patient actively participate in an intensive therapy program of at least 3 hrs of therapy per day at least 5 days per week? Yes The potential for patient to make measurable gains while on inpatient rehab is good Anticipated functional outcomes upon discharge from inpatient rehab are supervision and min assist  with PT, supervision and min assist with OT, supervision with SLP. Estimated rehab length of stay to reach the above functional goals is: 12-16 days Anticipated discharge destination: Home Overall Rehab/Functional Prognosis: excellent  POST ACUTE RECOMMENDATIONS: This patient's condition is appropriate for continued rehabilitative care in the following setting: CIR Patient has agreed to participate in recommended program. N/A Note that insurance prior authorization may be required for reimbursement for recommended  care.  Comment: Pt was moving fairly well at home leading up to this admission. Wife is at home with him and can provide assist. They recently installed a chair lift to allow [place] access to their second floor.    MEDICAL  RECOMMENDATIONS: Pt is sundowning and days/nights are out of sync. Seroquel has helped at home. However, 25mg  last night at 2200 did not affect him. Will increase dose to 50mg  and give at 2000 to better capture sundowning symptoms and reduce potential hangover effect in the morning. Spoke with his wife who was in favor.    I have personally performed a face to face diagnostic evaluation of this patient. Additionally, I have examined the patient's medical record including any pertinent labs and radiographic images.    Thanks,  Ranelle Oyster, MD 11/03/2023

## 2023-11-04 ENCOUNTER — Inpatient Hospital Stay (HOSPITAL_COMMUNITY): Payer: Medicare Other

## 2023-11-04 ENCOUNTER — Ambulatory Visit: Payer: Medicare Other | Admitting: Internal Medicine

## 2023-11-04 DIAGNOSIS — G40901 Epilepsy, unspecified, not intractable, with status epilepticus: Secondary | ICD-10-CM | POA: Diagnosis not present

## 2023-11-04 LAB — MAGNESIUM: Magnesium: 1.6 mg/dL — ABNORMAL LOW (ref 1.7–2.4)

## 2023-11-04 LAB — RENAL FUNCTION PANEL
Albumin: 2.3 g/dL — ABNORMAL LOW (ref 3.5–5.0)
Anion gap: 8 (ref 5–15)
BUN: 16 mg/dL (ref 8–23)
CO2: 26 mmol/L (ref 22–32)
Calcium: 8.9 mg/dL (ref 8.9–10.3)
Chloride: 103 mmol/L (ref 98–111)
Creatinine, Ser: 0.74 mg/dL (ref 0.61–1.24)
GFR, Estimated: >60 mL/min (ref >60)
Glucose, Bld: 176 mg/dL — ABNORMAL HIGH (ref 70–99)
Phosphorus: 3 mg/dL (ref 2.5–4.6)
Potassium: 3.9 mmol/L (ref 3.5–5.1)
Sodium: 137 mmol/L (ref 135–145)

## 2023-11-04 LAB — GLUCOSE, CAPILLARY
Glucose-Capillary: 204 mg/dL — ABNORMAL HIGH (ref 70–99)
Glucose-Capillary: 155 mg/dL — ABNORMAL HIGH (ref 70–99)
Glucose-Capillary: 351 mg/dL — ABNORMAL HIGH (ref 70–99)
Glucose-Capillary: 366 mg/dL — ABNORMAL HIGH (ref 70–99)
Glucose-Capillary: 341 mg/dL — ABNORMAL HIGH (ref 70–99)
Glucose-Capillary: 275 mg/dL — ABNORMAL HIGH (ref 70–99)

## 2023-11-04 MED ORDER — IOHEXOL 350 MG/ML SOLN
75.0000 mL | Freq: Once | INTRAVENOUS | Status: AC | PRN
  Administered 2023-11-04: 75 mL via INTRAVENOUS

## 2023-11-04 MED ORDER — MAGNESIUM SULFATE 2 GM/50ML IV SOLN
2.0000 g | Freq: Once | INTRAVENOUS | Status: AC
  Administered 2023-11-04: 2 g via INTRAVENOUS
  Filled 2023-11-04: qty 50

## 2023-11-04 NOTE — Progress Notes (Signed)
 Physical Therapy Treatment Patient Details Name: Alex Church MRN: [id number] DOB: [birth year] Today's Date: 11/04/2023   History of Present Illness 78 year old man who presented to [facility] 2/18 with witnessed seizure activity. ETT 2/18-2/20. PMHx significant for HTN, HLD, T2DM, B12 injections and glioblastoma (s/p craniotomy, XRT and chemotherapy, maintained on Temodar).    PT Comments  Pt resting in bed on arrival, eager for mobility as pt expressing desire to ambulate to bathroom. Pt requiring min A to come to sitting EOB and min A to boost to stand from EOB and BSC over low commode. Pt needing min-mod A during x2 gait trials with RW support with pt continuing to need near constant cues for closer RW proximity as pt pushing RW too far anterior and flexing trunk, demonstrating poor carryover for safety awareness from prior sessions. Pt fatiguing quickly this session with knees flexing last ~5' of gait needing max A to maintain balance and manage RW. Pt up in chair at end of session. Current plan remains appropriate to address deficits and maximize functional independence and decrease caregiver burden. Pt continues to benefit from skilled PT services to progress toward functional mobility goals.     If plan is discharge home, recommend the following: A lot of help with walking and/or transfers;A lot of help with bathing/dressing/bathroom;Direct supervision/assist for medications management;Direct supervision/assist for financial management;Assist for transportation;Help with stairs or ramp for entrance;Supervision due to cognitive status   Can travel by private vehicle        Equipment Recommendations  None recommended by PT    Recommendations for Other Services       Precautions / Restrictions Precautions Precautions: Fall Recall of Precautions/Restrictions: Impaired Restrictions Weight Bearing Restrictions Per Provider Order: No     Mobility  Bed Mobility Overal bed mobility: Needs  Assistance Bed Mobility: Supine to Sit     Supine to sit: HOB elevated, Min assist     General bed mobility comments: min A to elevate trunk and cues to scoot out to EOB    Transfers Overall transfer level: Needs assistance Equipment used: Rolling walker (2 wheels) Transfers: Sit to/from Stand, Bed to chair/wheelchair/BSC Sit to Stand: Min assist           General transfer comment: good power up from EOB and Comprehensive Outpatient Surge    Ambulation/Gait Ambulation/Gait assistance: Mod assist, Max assist Gait Distance (Feet): 15 Feet (x2) Assistive device: Rolling walker (2 wheels) Gait Pattern/deviations: Step-through pattern, Decreased stride length, Shuffle, Narrow base of support, Trunk flexed Gait velocity: decr     General Gait Details: shuffling steps with trunk flexed needing near constant cues for closer RW proximity and upright trunk and larger step lengths, max ceus to for safety to turn prior to sitting as pt reaching for armrest and attempting to sit prematurely   Stairs             Wheelchair Mobility     Tilt Bed    Modified Rankin (Stroke Patients Only)       Balance Overall balance assessment: Needs assistance Sitting-balance support: No upper extremity supported, Feet supported Sitting balance-Leahy Scale: Poor Sitting balance - Comments: able to maintain with cues for upright trunk Postural control: Left lateral lean Standing balance support: Bilateral upper extremity supported, During functional activity Standing balance-Leahy Scale: Poor Standing balance comment: heavy reliance on UE supprot  Communication Communication Communication: Impaired Factors Affecting Communication: Difficulty expressing self;Reduced clarity of speech  Cognition Arousal: Alert Behavior During Therapy: Flat affect   PT - Cognitive impairments: Attention, Sequencing, Problem solving, Safety/Judgement                       PT -  Cognition Comments: cues for sequencing and safety Following commands: Intact      Cueing Cueing Techniques: Verbal cues, Gestural cues  Exercises Other Exercises Other Exercises: pt declining further exercises, asking chair to be positioned in front of sink for ADLs    General Comments General comments (skin integrity, edema, etc.): spouse present      Pertinent Vitals/Pain Pain Assessment Pain Assessment: No/denies pain Pain Intervention(s): Monitored during session    Home Living                          Prior Function            PT Goals (current goals can now be found in the care plan section) Acute Rehab PT Goals PT Goal Formulation: With patient Time For Goal Achievement: 11/13/23 Progress towards PT goals: Progressing toward goals    Frequency    Min 1X/week      PT Plan      Co-evaluation              AM-PAC PT "6 Clicks" Mobility   Outcome Measure  Help needed turning from your back to your side while in a flat bed without using bedrails?: A Little Help needed moving from lying on your back to sitting on the side of a flat bed without using bedrails?: A Lot Help needed moving to and from a bed to a chair (including a wheelchair)?: A Lot Help needed standing up from a chair using your arms (e.g., wheelchair or bedside chair)?: A Little Help needed to walk in hospital room?: A Lot Help needed climbing 3-5 steps with a railing? : Total 6 Click Score: 13    End of Session Equipment Utilized During Treatment: Gait belt Activity Tolerance: Patient tolerated treatment well Patient left: with call bell/phone within reach;in chair;with family/visitor present Nurse Communication: Mobility status PT Visit Diagnosis: Other abnormalities of gait and mobility (R26.89);Muscle weakness (generalized) (M62.81)     Time: 1610-9604 PT Time Calculation (min) (ACUTE ONLY): 17 min  Charges:    $Gait Training: 8-22 mins PT General Charges $$  ACUTE PT VISIT: 1 Visit                     Stormy Connon R. PTA Acute Rehabilitation Services Office: [phone]   Catalina Antigua 11/04/2023, 12:17 PM

## 2023-11-04 NOTE — Progress Notes (Signed)
 Nutrition Follow-up  DOCUMENTATION CODES:   Non-severe (moderate) malnutrition in context of chronic illness  INTERVENTION:  Encourage PO intake of Dysphagia 1 diet  Mighty Shake BID with meals, each supplement provides 330 kcals and 9 grams of protein  Magic cup TID with meals, each supplement provides 290 kcal and 9 grams of protein   NUTRITION DIAGNOSIS:   Moderate Malnutrition related to chronic illness (cancer) as evidenced by moderate muscle depletion, moderate fat depletion. Ongoing  GOAL:   Patient will meet greater than or equal to 90% of their needs  Progressing  MONITOR:   Labs, Skin, PO intake, Supplement acceptance  REASON FOR ASSESSMENT:   Consult Enteral/tube feeding initiation and management  ASSESSMENT:   Pt with PMH of glioblastoma, DM, HLD, HTN, and B12 deficiency on monthly injections admitted with new onset status epilepticus.  2/18 - admitted, intubated 2/20 - extubated 2/25: diet advanced to dysphagia 1  Noted documented intake of meals is 100% in the last 24 hours. Pt's wife and sister in law at bedside during visit. Pt was eating breakfast tray upon visit and ate 100% of the meal. Pt was drinking a thick and easy milk and reports he likes the taste of it. He reports he has been eating great since being advanced to dysphagia 1 diet. Pt reports that he has experienced diarrhea from Ensure in the past but is agreeable to trying another supplement. RD will replace Ensure with mighty shake BID. Pt reports he loves magic cup but says they have not been coming on his trays and would prefer them to be. Will continue to supplement with these and add them to his meal trays.  Pt's wife reports pt takes a supplement at home called "Balance of Ashby [employer]es", which she reports "replaces vegetable intake for the day" since she does not like to cook veggies.   Pt's wife reports that pt is not wanting to continue cancer treatment after he is discharged from hospital  and reports that she does not agree with this decision. Wife reports they will discuss this after he goes home.  Meds reviewed: Ensure Enlive TID, Novolog, MVI, Colace, Miralax, keppra, decadron, magnesium sulfate, Fortaz  Labs reviewed: CBGs range from 143-395 in last 24 hrs, Magnesium low (1.6)  NUTRITION - FOCUSED PHYSICAL EXAM:  Flowsheet Row Most Recent Value  Orbital Region Moderate depletion  Upper Arm Region Moderate depletion  Thoracic and Lumbar Region Moderate depletion  Buccal Region Unable to assess  Temple Region Moderate depletion  Clavicle Bone Region Moderate depletion  Clavicle and Acromion Bone Region Severe depletion  Scapular Bone Region Severe depletion  Dorsal Hand Unable to assess  Patellar Region Moderate depletion  Anterior Thigh Region Moderate depletion  Posterior Calf Region Moderate depletion  Edema (RD Assessment) Mild  Hair Reviewed  Eyes Unable to assess  Mouth Unable to assess  Skin Reviewed  Nails Unable to assess       Diet Order:   Diet Order             DIET - DYS 1 Room service appropriate? Yes with Assist; Fluid consistency: Nectar Thick  Diet effective now                   EDUCATION NEEDS:   Not appropriate for education at this time  Skin:  Skin Assessment: Skin Integrity Issues: Skin Integrity Issues:: Stage II Stage II: buttocks  Last BM:  2/26-type 5  Height:   Ht Readings from Last 1 Encounters:  10/28/23 6' (1.829 m)    Weight:   Wt Readings from Last 1 Encounters:  11/04/23 79.4 kg     BMI:  Body mass index is 23.74 kg/m.  Estimated Nutritional Needs:   Kcal:  2100-2300  Protein:  110-125 grams  Fluid:  >2 L/day    Maceo Pro, MS Dietetic Intern

## 2023-11-04 NOTE — Progress Notes (Signed)
 PROGRESS NOTE    ABDULKADIR Church  VWU:[id number] DOB: 11/11/1945 DOA: 10/27/2023 PCP: Wanda Plump, MD   Brief Narrative:  This 78 year old male who presented to [facility] on 2/18 with witnessed seizure activity. PMHx significant for HTN, HLD, T2DM, B12 injections and glioblastoma (s/p craniotomy, XRT and chemotherapy, maintained on Temodar). Wife reports that some of the patient's antiepileptic medications may have been stopped prior to seizure.  Patient has remained stable.   Tachycardia persists.  EKG reveals ST-T wave changes.   D-dimer elevated, CTA chest ordered to rule out PE. Awaiting disposition.  Pursue acute rehab.  Assessment & Plan:   Principal Problem:   Status epilepticus (HCC) Active Problems:   Malnutrition of moderate degree   Status epilepticus: No evidence of recurrent seizures. Resolved. Neurology following; CT head > unremarkable Continue Keppra 1 g twice daily Medically stable,  awaiting disposition.   Acute metabolic encephalopathy: Multifactorial; due to UTI, status epilepticus.  Continue treatment as above. Continue course of antibiotics.  Urine cultures growing Pseudomonas.  Patient is currently on IV ceftazidime. AMS > Resolved.   History of glioblastoma status postresection: Decadron was tapered off as per Dr. Barbaraann Cao recommendation Neurology has restarted back on Decadron as patient was more lethargic. Continue with Decadron 4 mg IV twice daily Decadron to be tapered as outpatient Follow-up with neuro-oncology as outpatient after discharge   UTI: Urine culture growing Pseudomonas aeruginosa Continue ceftazidime Complete 5-day course of IV ceftazidime.   Diabetes mellitus type 2: Continue sliding scale insulin with NovoLog CBG well-controlled   History of hypertension colon Blood pressure well-controlled   Hypokalemia: Replaced, Continue to monitor.  Hypomagnesemia: Replaced. Continue to monitor.  Sinus tachycardia: Patient looks volume  depleted. IV hydration continued. EKG reveals ST-T wave changes. Troponin 150 > 171.  D-dimer elevated No chest pain or shortness of breath reported. History of glioblastoma-noted. CTA chest ruled out pulmonary embolism   Volume depletion: Continue gentle IV hydration.   Disposition -Likely acute rehab.       DVT prophylaxis: Lovenox Code Status: Full code Family Communication: No family at bed side Disposition Plan:    Status is: Inpatient Remains inpatient appropriate because: Severity of illness    Consultants:  None  Procedures: CT head  Antimicrobials: Anti-infectives (From admission, onward)    Start     Dose/Rate Route Frequency Ordered Stop   10/30/23 1230  cefTAZidime (FORTAZ) 1 g in sodium chloride 0.9 % 100 mL IVPB        1 g 200 mL/hr over 30 Minutes Intravenous Every 8 hours 10/30/23 1139     10/28/23 1200  cefTRIAXone (ROCEPHIN) 2 g in sodium chloride 0.9 % 100 mL IVPB  Status:  Discontinued        2 g 200 mL/hr over 30 Minutes Intravenous Every 24 hours 10/28/23 0831 10/30/23 1138       Subjective: Patient was seen and examined at bedside.  Overnight events noted.   Patient appears comfortable, heart rate is still remains elevated.  Objective: Vitals:   11/04/23 0400 11/04/23 0712 11/04/23 0727 11/04/23 1122  BP: (!) 137/93  136/88 111/71  Pulse: (!) 116  (!) 110 98  Resp: 18  17 17   Temp: 99 F (37.2 C)  98.2 F (36.8 C) 98 F (36.7 C)  TempSrc: Oral  Oral Oral  SpO2: 97%  94% 95%  Weight:  79.4 kg    Height:        Intake/Output Summary (Last 24 hours) at 11/04/2023 1443  Last data filed at 11/04/2023 0820 Gross per 24 hour  Intake 540.3 ml  Output 1190 ml  Net -649.7 ml   Filed Weights   11/01/23 1158 11/03/23 0500 11/04/23 0712  Weight: 81.6 kg 79.4 kg 79.4 kg    Examination:  General exam: Appears calm and comfortable, deconditioned, not in any acute distress. Respiratory system: Clear to auscultation. Respiratory effort  normal.  RR 15 Cardiovascular system: S1 & S2 heard, RRR. No JVD, murmurs, rubs, gallops or clicks. No pedal edema. Gastrointestinal system: Abdomen is non distended, soft and non tender. Normal bowel sounds heard. Central nervous system: Alert and oriented x 3. No focal neurological deficits. Extremities: No edema, no cyanosis, no clubbing Skin: No rashes, lesions or ulcers Psychiatry: Judgement and insight appear normal. Mood & affect appropriate.     Data Reviewed: I have personally reviewed following labs and imaging studies  CBC: Recent Labs  Lab 10/29/23 0819  WBC 11.5*  HGB 12.6*  HCT 38.0*  MCV 90.9  PLT 212   Basic Metabolic Panel: Recent Labs  Lab 10/28/23 1719 10/29/23 0819 10/29/23 1703 10/30/23 0506 10/31/23 0712 11/01/23 8295 11/02/23 0701 11/03/23 0545 11/04/23 0634  NA  --  136  --    < > 139 140 137 134* 137  K  --  3.4*  --    < > 3.4* 3.4* 3.4* 3.5 3.9  CL  --  101  --    < > 103 105 103 101 103  CO2  --  22  --    < > 25 25 26 25 26   GLUCOSE  --  194*  --    < > 153* 163* 127* 205* 176*  BUN  --  14  --    < > 12 15 14 12 16   CREATININE  --  1.15  --    < > 0.76 0.85 0.82 0.65 0.74  CALCIUM  --  7.9*  --    < > 8.4* 8.8* 8.7* 8.6* 8.9  MG 1.4* 2.0 1.7  --  1.6* 1.4* 1.3* 1.5* 1.6*  PHOS 2.6 2.6 2.0*  --   --   --   --  2.7 3.0   < > = values in this interval not displayed.   GFR: Estimated Creatinine Clearance: 84.9 mL/min (by C-G formula based on SCr of 0.74 mg/dL). Liver Function Tests: Recent Labs  Lab 11/03/23 0545 11/04/23 0634  ALBUMIN 2.2* 2.3*   No results for input(s): "LIPASE", "AMYLASE" in the last 168 hours. No results for input(s): "AMMONIA" in the last 168 hours. Coagulation Profile: No results for input(s): "INR", "PROTIME" in the last 168 hours. Cardiac Enzymes: No results for input(s): "CKTOTAL", "CKMB", "CKMBINDEX", "TROPONINI" in the last 168 hours. BNP (last 3 results) No results for input(s): "PROBNP" in the last  8760 hours. HbA1C: No results for input(s): "HGBA1C" in the last 72 hours. CBG: Recent Labs  Lab 11/03/23 2002 11/03/23 2338 11/04/23 0353 11/04/23 0729 11/04/23 1122  GLUCAP 395* 292* 204* 155* 351*   Lipid Profile: Recent Labs    11/03/23 0545  TRIG 107   Thyroid Function Tests: No results for input(s): "TSH", "T4TOTAL", "FREET4", "T3FREE", "THYROIDAB" in the last 72 hours. Anemia Panel: No results for input(s): "VITAMINB12", "FOLATE", "FERRITIN", "TIBC", "IRON", "RETICCTPCT" in the last 72 hours. Sepsis Labs: No results for input(s): "PROCALCITON", "LATICACIDVEN" in the last 168 hours.  Recent Results (from the past 240 hours)  MRSA Next Gen by PCR, Nasal  Status: Abnormal   Collection Time: 10/27/23 10:29 PM   Specimen: Nasal Mucosa; Nasal Swab  Result Value Ref Range Status   MRSA by PCR Next Gen DETECTED (A) NOT DETECTED Final    Comment: RESULT CALLED TO, READ BACK BY AND VERIFIED WITH: J TIRRELL,RN@0200  10/28/23 MK (NOTE) The GeneXpert MRSA Assay (FDA approved for NASAL specimens only), is one component of a comprehensive MRSA colonization surveillance program. It is not intended to diagnose MRSA infection nor to guide or monitor treatment for MRSA infections. Test performance is not FDA approved in patients less than 10 years old. Performed at [facility], [address], [place], [place] [address]   Urine Culture     Status: Abnormal   Collection Time: 10/28/23 10:10 AM   Specimen: Urine, Random  Result Value Ref Range Status   Specimen Description URINE, RANDOM  Final   Special Requests   Final    NONE Reflexed from [id number] Performed at [facility], [address], [place], [place] [address]    Culture >=100,000 COLONIES/mL PSEUDOMONAS AERUGINOSA (A)  Final   Report Status 10/30/2023 FINAL  Final   Organism ID, Bacteria PSEUDOMONAS AERUGINOSA (A)  Final      Susceptibility   Pseudomonas aeruginosa - MIC*    CEFTAZIDIME 4 SENSITIVE  Sensitive     CIPROFLOXACIN <=0.25 SENSITIVE Sensitive     GENTAMICIN 2 SENSITIVE Sensitive     IMIPENEM 2 SENSITIVE Sensitive     * >=100,000 COLONIES/mL PSEUDOMONAS AERUGINOSA    Radiology Studies: No results found.  Scheduled Meds:  dexamethasone (DECADRON) injection  4 mg Intravenous Q12H   docusate  100 mg Oral BID   enoxaparin (LOVENOX) injection  40 mg Subcutaneous Q24H   insulin aspart  0-15 Units Subcutaneous Q4H   levETIRAcetam  1,000 mg Oral BID   multivitamin with minerals  1 tablet Oral Daily   mouth rinse  15 mL Mouth Rinse 4 times per day   polyethylene glycol  17 g Oral Daily   QUEtiapine  50 mg Oral QHS   Continuous Infusions:  cefTAZidime (FORTAZ)  IV 1 g (11/04/23 1301)   dexmedetomidine (PRECEDEX) IV infusion Stopped (10/30/23 1233)     LOS: 8 days    Time spent: 50 mins    Willeen Niece, MD [facility]   If 7PM-7AM, please contact night-coverage

## 2023-11-04 NOTE — TOC Progression Note (Signed)
 Transition of Care [facility]) - Progression Note    Patient Details  Name: Alex Church MRN: [id number] Date of Birth: Apr 01, 1946  [facility] CM/SW Contact  Baldemar Lenis, [place] Phone Number: 11/04/2023, 4:11 PM  Clinical Narrative:   CSW attempted to reach [facility] today, left a voicemail requesting call back. CSW also received update from [facility] that they will not have a bed available, they are unable to offer a bed for the patient. CSW provided update to spouse. Spouse was requesting information from [facility] about possible LTC, sent request to [facility] to contact spouse to discuss. [facility] will have a bed tomorrow, spouse would like to accept. CSW met with spouse at bedside to answer questions about home health after SNF. CSW requested CMA to initiate insurance authorization, insurance was approved. CSW to follow.    Expected Discharge Plan: Skilled Nursing Facility Barriers to Discharge: Continued Medical Work up  Expected Discharge Plan and Services In-house Referral: Clinical Social Work     Living arrangements for the past 2 months: Single Family Home                                       Social Determinants of Health (SDOH) Interventions SDOH Screenings   Food Insecurity: No Food Insecurity (10/31/2023)  Housing: Low Risk  (10/31/2023)  Transportation Needs: No Transportation Needs (10/31/2023)  Utilities: Not At Risk (10/31/2023)  Alcohol Screen: Low Risk  (10/21/2022)  Depression (PHQ2-9): Low Risk  (07/20/2023)  Financial Resource Strain: Low Risk  (07/13/2023)  Physical Activity: Insufficiently Active (07/13/2023)  Social Connections: Moderately Isolated (10/31/2023)  Stress: Stress Concern Present (07/13/2023)  Tobacco Use: Low Risk  (10/27/2023)  Health Literacy: Adequate Health Literacy (06/04/2023)    Readmission Risk Interventions    05/15/2023   11:43 AM  Readmission Risk Prevention Plan  Transportation Screening  Complete  Home Care Screening Complete  Medication Review (RN CM) Complete

## 2023-11-04 NOTE — Inpatient Diabetes Management (Signed)
 Inpatient Diabetes Program Recommendations  AACE/ADA: New Consensus Statement on Inpatient Glycemic Control   Target Ranges:  Prepandial:   less than 140 mg/dL      Peak postprandial:   less than 180 mg/dL (1-2 hours)      Critically ill patients:  140 - 180 mg/dL    Latest Reference Range & Units 11/03/23 07:28 11/03/23 10:59 11/03/23 15:55 11/03/23 20:02 11/03/23 23:38 11/04/23 03:53 11/04/23 07:29  Glucose-Capillary 70 - 99 mg/dL 952 (H) 841 (H) 324 (H) 395 (H) 292 (H) 204 (H) 155 (H)   Review of Glycemic Control  Diabetes history: DM2 Outpatient Diabetes medications: Metformin 500 mg bid, Actos 45 mg Daily, Januvia 100 mg Daily  Current orders for Inpatient glycemic control: Novolog 0-15 units Q4H; Decadron 4 mg Q12H   Inpatient Diabetes Program Recommendations:     Insulin: If steroids are continued as ordered, please consider ordering Semglee 8 units Q24H and Novolog 3 units TID with meals for meal coverage if patient eats at least 50% of meals.   Thanks, Orlando Penner, RN, MSN, CDCES Diabetes Coordinator Inpatient Diabetes Program [phone] (Team Pager from 8am to 5pm)

## 2023-11-05 ENCOUNTER — Telehealth: Payer: Self-pay | Admitting: *Deleted

## 2023-11-05 ENCOUNTER — Encounter: Payer: Self-pay | Admitting: Internal Medicine

## 2023-11-05 ENCOUNTER — Other Ambulatory Visit (HOSPITAL_COMMUNITY): Payer: Self-pay

## 2023-11-05 DIAGNOSIS — G40901 Epilepsy, unspecified, not intractable, with status epilepticus: Secondary | ICD-10-CM | POA: Diagnosis not present

## 2023-11-05 DIAGNOSIS — G40509 Epileptic seizures related to external causes, not intractable, without status epilepticus: Secondary | ICD-10-CM | POA: Diagnosis not present

## 2023-11-05 DIAGNOSIS — M6281 Muscle weakness (generalized): Secondary | ICD-10-CM | POA: Diagnosis not present

## 2023-11-05 DIAGNOSIS — E876 Hypokalemia: Secondary | ICD-10-CM | POA: Diagnosis not present

## 2023-11-05 DIAGNOSIS — R531 Weakness: Secondary | ICD-10-CM | POA: Diagnosis not present

## 2023-11-05 DIAGNOSIS — R1312 Dysphagia, oropharyngeal phase: Secondary | ICD-10-CM | POA: Diagnosis not present

## 2023-11-05 DIAGNOSIS — R2689 Other abnormalities of gait and mobility: Secondary | ICD-10-CM | POA: Diagnosis not present

## 2023-11-05 DIAGNOSIS — Z7401 Bed confinement status: Secondary | ICD-10-CM | POA: Diagnosis not present

## 2023-11-05 DIAGNOSIS — R278 Other lack of coordination: Secondary | ICD-10-CM | POA: Diagnosis not present

## 2023-11-05 DIAGNOSIS — G9341 Metabolic encephalopathy: Secondary | ICD-10-CM | POA: Diagnosis not present

## 2023-11-05 DIAGNOSIS — I4711 Inappropriate sinus tachycardia, so stated: Secondary | ICD-10-CM | POA: Diagnosis not present

## 2023-11-05 DIAGNOSIS — R41841 Cognitive communication deficit: Secondary | ICD-10-CM | POA: Diagnosis not present

## 2023-11-05 DIAGNOSIS — E119 Type 2 diabetes mellitus without complications: Secondary | ICD-10-CM | POA: Diagnosis not present

## 2023-11-05 DIAGNOSIS — E440 Moderate protein-calorie malnutrition: Secondary | ICD-10-CM | POA: Diagnosis not present

## 2023-11-05 LAB — BASIC METABOLIC PANEL WITH GFR
Anion gap: 9 (ref 5–15)
BUN: 17 mg/dL (ref 8–23)
CO2: 24 mmol/L (ref 22–32)
Calcium: 8.9 mg/dL (ref 8.9–10.3)
Chloride: 102 mmol/L (ref 98–111)
Creatinine, Ser: 0.73 mg/dL (ref 0.61–1.24)
GFR, Estimated: >60 mL/min (ref >60)
Glucose, Bld: 134 mg/dL — ABNORMAL HIGH (ref 70–99)
Potassium: 3.7 mmol/L (ref 3.5–5.1)
Sodium: 135 mmol/L (ref 135–145)

## 2023-11-05 LAB — MAGNESIUM: Magnesium: 1.6 mg/dL — ABNORMAL LOW (ref 1.7–2.4)

## 2023-11-05 LAB — PHOSPHORUS: Phosphorus: 3.6 mg/dL (ref 2.5–4.6)

## 2023-11-05 LAB — GLUCOSE, CAPILLARY
Glucose-Capillary: 254 mg/dL — ABNORMAL HIGH (ref 70–99)
Glucose-Capillary: 124 mg/dL — ABNORMAL HIGH (ref 70–99)
Glucose-Capillary: 275 mg/dL — ABNORMAL HIGH (ref 70–99)
Glucose-Capillary: 392 mg/dL — ABNORMAL HIGH (ref 70–99)

## 2023-11-05 MED ORDER — MAGNESIUM SULFATE 2 GM/50ML IV SOLN
2.0000 g | Freq: Once | INTRAVENOUS | Status: AC
  Administered 2023-11-05: 2 g via INTRAVENOUS
  Filled 2023-11-05: qty 50

## 2023-11-05 MED ORDER — QUETIAPINE FUMARATE 50 MG PO TABS
50.0000 mg | ORAL_TABLET | Freq: Every day | ORAL | 0 refills | Status: DC
  Filled 2023-11-05: qty 30, 30d supply, fill #0
  Filled 2023-11-26: qty 15, 15d supply, fill #0

## 2023-11-05 MED ORDER — DEXAMETHASONE 4 MG PO TABS
4.0000 mg | ORAL_TABLET | Freq: Two times a day (BID) | ORAL | 0 refills | Status: DC
  Filled 2023-11-05 (×2): qty 60, 30d supply, fill #0

## 2023-11-05 MED ORDER — INSULIN ASPART 100 UNIT/ML IJ SOLN
3.0000 [IU] | Freq: Three times a day (TID) | INTRAMUSCULAR | Status: DC
  Administered 2023-11-05 (×2): 3 [IU] via SUBCUTANEOUS

## 2023-11-05 MED ORDER — INSULIN GLARGINE 100 UNIT/ML ~~LOC~~ SOLN
8.0000 [IU] | Freq: Every day | SUBCUTANEOUS | Status: DC
  Administered 2023-11-05: 8 [IU] via SUBCUTANEOUS
  Filled 2023-11-05: qty 0.08

## 2023-11-05 MED ORDER — LEVETIRACETAM 1000 MG PO TABS
1000.0000 mg | ORAL_TABLET | Freq: Two times a day (BID) | ORAL | 0 refills | Status: DC
  Filled 2023-11-05: qty 60, 30d supply, fill #0

## 2023-11-05 NOTE — Consult Note (Signed)
 [facility] Liaison Consult Note   11/05/2023  Alex Church 09/12/1945 [id number]  [facility] [VBCI] Consult: LLOS 8 days and extreme high risk  Primary Care Provider:  Wanda Plump, MD with [facility] at [facility] is listed for [facility] follow up  [facility] Liaison remote coverage review for patient admitted to [facility]   Insurance: Valinda Hoar [facility]  Patient was reviewed for 4 admissions in 6 months with extreme high risk score for unplanned readmission risk 8-day length of stay included an transfer and ICU stay -  reviewed for barriers to care when returning to community and currently for SNF for rehab.  Patient is being transitioned to a skilled nursing facility level of care for post hospital transition.  Plan:  If transitions to a SNF level of care then post hospital needs are to be met at that level before returning to community. No VBCI RN PAC follow up planned at a facility.  Patient also to have follow up with Oncology team.  For questions or referrals, please contact:  Charlesetta Shanks, RN, BSN, CCM   [facility], [facility] Liaison Direct Dial: [phone] or secure chat Email: [email or web address]

## 2023-11-05 NOTE — Inpatient Diabetes Management (Signed)
 Inpatient Diabetes Program Recommendations  AACE/ADA: New Consensus Statement on Inpatient Glycemic Control   Target Ranges:  Prepandial:   less than 140 mg/dL      Peak postprandial:   less than 180 mg/dL (1-2 hours)      Critically ill patients:  140 - 180 mg/dL    Latest Reference Range & Units 11/04/23 07:29 11/04/23 11:22 11/04/23 16:06 11/04/23 19:36 11/04/23 23:25 11/05/23 03:16  Glucose-Capillary 70 - 99 mg/dL 782 (H)  Novolog 3 units 351 (H)  Novolog 15 units 366 (H)  Novolog 15 units 341 (H)  Novolog 11 units 275 (H)  Novolog 8 units 254 (H)  Novolog 8 units    Review of Glycemic Control  Diabetes history: DM2 Outpatient Diabetes medications: Metformin 500 mg bid, Actos 45 mg Daily, Januvia 100 mg Daily  Current orders for Inpatient glycemic control: Novolog 0-15 units Q4H; Decadron 4 mg Q12H   Inpatient Diabetes Program Recommendations:     Insulin: CBG ranged from 155- 366 mg/dl on 9/56 and 213-086 mg/dl today so far. Patient has received a total of Novolog 57 units for correction over the past 24 hours.  If steroids are continued as ordered, please consider ordering Semglee 8 units Q24H and Novolog 3 units TID with meals for meal coverage if patient eats at least 50% of meals.    Thanks, Orlando Penner, RN, MSN, CDCES Diabetes Coordinator Inpatient Diabetes Program [phone] (Team Pager from 8am to 5pm)

## 2023-11-05 NOTE — TOC Transition Note (Signed)
 Transition of Care [facility]) - Discharge Note   Patient Details  Name: Alex Church MRN: [id number] Date of Birth: [birth year]  [facility] CM/SW Contact:  Lynleigh Kovack Felipa Emory, Student-Social Work Phone Number: 11/05/2023, 1:50 PM   Clinical Narrative:   MSW Student received insurance approval for patient to admit to [facility]. MSW Student confirmed with MD that patient is stable for discharge. MSW Student notified spouse [name] and they are in agreement with discharge. MSW Student confirmed bed is available at SNF. Transport arranged with [facility] for next available.   Number to call report:  [phone] RM: [id number]     Final next level of care: Skilled Nursing Facility Barriers to Discharge: Barriers Resolved   Patient Goals and CMS Choice Patient states their goals for this hospitalization and ongoing recovery are:: Unable to access CMS Medicare.gov Compare Post Acute Care list provided to:: Patient Choice offered to / list presented to : Patient [place] ownership interest in [facility].provided to:: Patient    Discharge Placement              Patient chooses bed at: [facility] Patient to be transferred to facility by: [facility] Name of family member notified: [name] Patient and family notified of of transfer: 11/05/23  Discharge Plan and Services Additional resources added to the After Visit Summary for   In-house Referral: Clinical Social Work                                   Social Drivers of Health (SDOH) Interventions SDOH Screenings   Food Insecurity: No Food Insecurity (10/31/2023)  Housing: Low Risk  (10/31/2023)  Transportation Needs: No Transportation Needs (10/31/2023)  Utilities: Not At Risk (10/31/2023)  Alcohol Screen: Low Risk  (10/21/2022)  Depression (PHQ2-9): Low Risk  (07/20/2023)  Financial Resource Strain: Low Risk  (07/13/2023)  Physical Activity: Insufficiently Active (07/13/2023)  Social Connections: Moderately Isolated  (10/31/2023)  Stress: Stress Concern Present (07/13/2023)  Tobacco Use: Low Risk  (10/27/2023)  Health Literacy: Adequate Health Literacy (06/04/2023)     Readmission Risk Interventions    05/15/2023   11:43 AM  Readmission Risk Prevention Plan  Transportation Screening Complete  Home Care Screening Complete  Medication Review (RN CM) Complete

## 2023-11-05 NOTE — Discharge Instructions (Signed)
 Follow-up with primary care physician in 1 week. Advised to follow-up with Neurology as scheduled. Advised to take Keppra 1000 mg every 12 hours daily Advised to take Decadron 4 mg every 12 hours advised outpatient tapering of steroids. Advised to follow-up with neuro-oncology Dr. Barbaraann Cao.

## 2023-11-05 NOTE — Telephone Encounter (Signed)
 Connected with Alex Church's spouse [name].  "I need FMLA  for myself to be out of work while he goes through.  Do you all help[ me with this?  I have [employer] paperwork.  Who do I address it to and what is the fax number?"  No further questions or needs.  Provided toll free fax number.  Sent HIPAA Authorization through [employer] to [email or web address].

## 2023-11-05 NOTE — Plan of Care (Signed)

## 2023-11-05 NOTE — Discharge Summary (Signed)
 Physician Discharge Summary  Alex Church ZOX:[id number] DOB: 1945-10-28 DOA: 10/27/2023  PCP: Wanda Plump, MD  Admit date: 10/27/2023  Discharge date: 11/05/2023  Admitted From: Home  Disposition:SNF  Recommendations for Outpatient Follow-up:  Follow up with PCP in 1-2 weeks. Please obtain BMP/CBC in one week Advised to follow-up with Neurology as scheduled. Advised to take Keppra 1000 mg every 12 hours daily Advised to take Decadron 4 mg every 12 hours,  advised outpatient tapering of steroids. Advised to follow-up with Neuro-oncology Dr. Barbaraann Cao.  Home Health: None. Equipment/Devices:None  Discharge Condition: Stable CODE STATUS:Full code Diet recommendation: Heart Healthy   Brief Summary / Hospital Course: This 78 year old male who presented to [facility] on 10/27/23 with witnessed seizure activity. PMHx significant for HTN, HLD, T2DM, B12 injections and glioblastoma (s/p craniotomy, XRT and chemotherapy, maintained on Temodar). Wife reports that some of the patient's antiepileptic medications may have been stopped prior to seizure.  Patient has remained stable. Tachycardia persists.  EKG reveals ST-T wave changes.   D-dimer elevated, CTA chest ruled out PE.  Patient is medically clear,  Neurology signed off , recommended to continue Keppra 1000 mg every 12 hours and Decadron 4 mg every 12 hours , advised outpatient tapering of steroids and advised outpatient follow-up with Neuro-oncology Dr. Barbaraann Cao.  PT and OT recommended skilled nursing facility for rehab.  Patient is being discharged.  Discharge Diagnoses:  Principal Problem:   Status epilepticus (HCC) Active Problems:   Malnutrition of moderate degree  Status epilepticus: No evidence of recurrent seizures. Resolved. Neurology following; CT head > unremarkable Continue Keppra 1 g twice daily Medically stable,  awaiting disposition.   Acute metabolic encephalopathy: Multifactorial; due to UTI, status epilepticus.  Continue  treatment as above. Urine cultures growing Pseudomonas.  Patient is currently on IV ceftazidime. AMS > Resolved.  Patient completed course of antibiotics.   History of glioblastoma status postresection: Decadron was tapered off as per Dr. Barbaraann Cao recommendation Neurology has restarted back on Decadron as patient was more lethargic. Continue with Decadron 4 mg IV twice daily Decadron to be tapered as outpatient Follow-up with neuro-oncology as outpatient after discharge.   UTI: Urine culture growing Pseudomonas aeruginosa Continue ceftazidime Completed 5-day course of IV ceftazidime.   Diabetes mellitus type 2: Continue sliding scale insulin with NovoLog CBG well-controlled.   History of hypertension colon Blood pressure well-controlled   Hypokalemia: Replaced, Continue to monitor.   Hypomagnesemia: Replaced. Continue to monitor.   Sinus tachycardia: Patient looks volume depleted. IV hydration continued. EKG reveals ST-T wave changes. Troponin 150 > 171.  D-dimer elevated No chest pain or shortness of breath reported. History of glioblastoma-noted. CTA chest ruled out pulmonary embolism   Volume depletion: Continue gentle IV hydration.   Disposition -Likely acute rehab.    Discharge Instructions  Discharge Instructions     Call MD for:  difficulty breathing, headache or visual disturbances   Complete by: As directed    Call MD for:  persistant dizziness or light-headedness   Complete by: As directed    Call MD for:  persistant nausea and vomiting   Complete by: As directed    Diet - low sodium heart healthy   Complete by: As directed    Diet Carb Modified   Complete by: As directed    Discharge instructions   Complete by: As directed    Follow-up with primary care physician in 1 week. Advised to follow-up with neurology as scheduled. Advised to take Keppra 1000 mg every 12  hours daily Advised to take Decadron 4 mg every 12 hours advised outpatient tapering  of steroids. Advised to follow-up with neuro-oncology Dr. Barbaraann Cao.   Increase activity slowly   Complete by: As directed    No wound care   Complete by: As directed       Allergies as of 11/05/2023   No Known Allergies      Medication List     STOP taking these medications    baclofen 10 MG tablet Commonly known as: LIORESAL   clotrimazole-betamethasone cream Commonly known as: LOTRISONE   Colchicine 0.6 MG Caps   neomycin-polymyxin b-dexamethasone 3.5-10000-0.1 Oint Commonly known as: MAXITROL   sodium chloride 1 g tablet       TAKE these medications    acetaminophen 500 MG tablet Commonly known as: TYLENOL Take 1,000 mg by mouth every 6 (six) hours as needed for mild pain (pain score 1-3) (Tries to only take twice a week).   allopurinol 100 MG tablet Commonly known as: ZYLOPRIM Take 1 tablet (100 mg total) by mouth 2 (two) times daily.   atorvastatin 10 MG tablet Commonly known as: LIPITOR Take 1 tablet (10 mg total) by mouth at bedtime.   carvedilol 12.5 MG tablet Commonly known as: COREG Take 0.5 tablets (6.25 mg total) by mouth 2 (two) times daily with a meal.   cloNIDine 0.1 MG tablet Commonly known as: CATAPRES Take 1 tablet (0.1 mg total) by mouth 2 (two) times daily.   dexamethasone 4 MG tablet Commonly known as: DECADRON Take 1 tablet (4 mg total) by mouth 2 (two) times daily with a meal. What changed:  medication strength See the new instructions.   glucose blood test strip Check blood sugars once daily   Januvia 100 MG tablet Generic drug: sitaGLIPtin Take 1 tablet (100 mg total) by mouth daily.   levETIRAcetam 1000 MG tablet Commonly known as: KEPPRA Take 1 tablet (1,000 mg total) by mouth 2 (two) times daily. What changed:  medication strength how much to take when to take this   lisinopril 20 MG tablet Commonly known as: ZESTRIL Take 20 mg by mouth at bedtime.   melatonin 5 MG Tabs Take 1 tablet (5 mg total) by mouth at  bedtime. What changed:  when to take this reasons to take this   metFORMIN 1000 MG tablet Commonly known as: GLUCOPHAGE Take 1 tablet (1,000 mg total) by mouth 2 (two) times daily with a meal. What changed: how much to take   ondansetron 8 MG tablet Commonly known as: ZOFRAN Take 1 tablet (8 mg total) by mouth every 8 (eight) hours as needed for nausea or vomiting. May take 30-60 minutes prior to Temodar administration if nausea/vomiting occurs as needed.   onetouch ultrasoft lancets Check bloods sugars once daily   pantoprazole 40 MG tablet Commonly known as: Protonix Take 1 tablet (40 mg total) by mouth daily.   pioglitazone 45 MG tablet Commonly known as: Actos Take 1 tablet (45 mg total) by mouth daily.   QUEtiapine 50 MG tablet Commonly known as: SEROQUEL Take 1 tablet (50 mg total) by mouth at bedtime. What changed:  medication strength how much to take   senna 8.6 MG Tabs tablet Commonly known as: SENOKOT Take 1 tablet by mouth daily as needed for mild constipation or moderate constipation.   tamsulosin 0.4 MG Caps capsule Commonly known as: FLOMAX Take 1 capsule (0.4 mg total) by mouth daily after supper.   temozolomide 100 MG capsule Commonly known as:  TEMODAR Take 3 capsules (300 mg total) by mouth at bedtime. Take for 5 days on, then off for 23 days. Repeat every 28 days. Take on an empty stomach to decrease nausea & vomiting.        Follow-up Information     Wanda Plump, MD Follow up in 1 week(s).   Specialty: Internal Medicine Contact information: [address] [place] [address] [phone]         [facility] Follow up in 1 week(s).   Contact information: [address] [place] [address] [phone]                No Known Allergies  Consultations: Neurology   Procedures/Studies: CT Angio Chest Pulmonary Embolism (PE) W or WO Contrast Result Date: 11/04/2023 CLINICAL  DATA:  Pulmonary embolism (PE) suspected, high prob EXAM: CT ANGIOGRAPHY CHEST WITH CONTRAST TECHNIQUE: Multidetector CT imaging of the chest was performed using the standard protocol during bolus administration of intravenous contrast. Multiplanar CT image reconstructions and MIPs were obtained to evaluate the vascular anatomy. RADIATION DOSE REDUCTION: This exam was performed according to the departmental dose-optimization program which includes automated exposure control, adjustment of the mA and/or kV according to patient size and/or use of iterative reconstruction technique. CONTRAST:  75mL OMNIPAQUE IOHEXOL 350 MG/ML SOLN COMPARISON:  07/11/2023, 10/28/2023 FINDINGS: Cardiovascular: This is a technically adequate evaluation of the pulmonary vasculature. No filling defects or pulmonary emboli. The heart is unremarkable without pericardial effusion. No evidence of thoracic aortic aneurysm or dissection. Atherosclerosis of the aorta and coronary vasculature. Mediastinum/Nodes: No enlarged mediastinal, hilar, or axillary lymph nodes. Thyroid gland, trachea, and esophagus demonstrate no significant findings. Lungs/Pleura: Dependent lower lobe atelectasis, left greater than right. Trace left pleural effusion. No airspace disease or pneumothorax. Central airways are patent. Upper Abdomen: Stable calcifications within the gallbladder. Stable cyst or hemangioma within the spleen. Musculoskeletal: No acute or destructive bony abnormalities. Reconstructed images demonstrate no additional findings. Review of the MIP images confirms the above findings. IMPRESSION: 1. No evidence of pulmonary embolus. 2. Trace left pleural effusion, with dependent lower lobe atelectasis left greater than right. 3. Stable gallbladder calcifications consistent with cholelithiasis or porcelain gallbladder. 4. Aortic Atherosclerosis (ICD10-I70.0). Coronary artery atherosclerosis. Electronically Signed   By: Sharlet Salina M.D.   On: 11/04/2023  18:54   CT HEAD WO CONTRAST ( ) Result Date: 10/31/2023 CLINICAL DATA:  Brain/CNS neoplasm, staging EXAM: CT HEAD WITHOUT CONTRAST TECHNIQUE: Contiguous axial images were obtained from the base of the skull through the vertex without intravenous contrast. RADIATION DOSE REDUCTION: This exam was performed according to the departmental dose-optimization program which includes automated exposure control, adjustment of the mA and/or kV according to patient size and/or use of iterative reconstruction technique. COMPARISON:  Head CT 10/27/2023, brain MR 10/30/2023 FINDINGS: Brain: No hemorrhage. No hydrocephalus. No extra-axial fluid collection. No mastoid. No mass is. There is dural thickening along the craniotomy site. There is encephalomalacia in the right frontal lobe adjacent to the craniotomy site. There is a background of moderate chronic microvascular ischemic change. Generalized volume loss. No hemorrhage. No hydrocephalus. No CT evidence of an acute cortical infarct. 7 mm rightward midline shift, unchanged from prior exam. Vascular: No hyperdense vessel or unexpected calcification. Skull: Postsurgical changes from a right frontal craniotomy. Sinuses/Orbits: No middle ear or mastoid effusion. Paranasal sinuses are clear. Orbits are unremarkable. Other: None. IMPRESSION: 1.   No acute intracranial abnormality 2. Postsurgical changes from a  right frontal craniotomy with unchanged CT appearance of the subjacent resection cavity Electronically Signed   By: Lorenza Cambridge M.D.   On: 10/31/2023 13:01   MR BRAIN W WO CONTRAST Result Date: 10/30/2023 CLINICAL DATA:  Brain/CNS neoplasm staging EXAM: MRI HEAD WITHOUT AND WITH CONTRAST TECHNIQUE: Multiplanar, multiecho pulse sequences of the brain and surrounding structures were obtained without and with intravenous contrast. CONTRAST:  7.66mL GADAVIST GADOBUTROL 1 MMOL/ML IV SOLN COMPARISON:  10/07/2023 FINDINGS: The examination is severely motion degraded. Brain:  Unchanged appearance of small right anterior extra-axial collection. No acute or chronic hemorrhage. There is multifocal hyperintense T2-weighted signal within the white matter. Generalized volume loss. There is a right frontal resection cavity with a small area of contrast enhancement along its medial border measuring 11 x 5 mm. Peripheral enhancement of the cavity is unchanged. The degree of surrounding hyperintense T2-weighted signal is also unchanged. The midline structures are normal. Vascular: Normal flow voids. Skull and upper cervical spine: Normal calvarium and skull base. Visualized upper cervical spine and soft tissues are normal. Sinuses/Orbits:No paranasal sinus fluid levels or advanced mucosal thickening. No mastoid or middle ear effusion. Normal orbits. IMPRESSION: 1. Severely motion degraded examination. 2. Right frontal resection cavity with a small area of contrast enhancement along its medial border measuring 11 x 5 mm. This could represent recurrent disease, but the degree of motion limits assessment. Electronically Signed   By: Deatra Robinson M.D.   On: 10/30/2023 19:06   DG Swallowing Func-Speech Pathology Result Date: 10/30/2023 Table formatting from the original result was not included. Modified Barium Swallow Study Patient Details Name: Alex Church MRN: [id number] Date of Birth: March 22, 1946 Today's Date: 10/30/2023 HPI/PMH: HPI: 78 year old man who presented to [facility] 2/18 with witnessed seizure activity. MRI pending. ETT 2/18-2/20. PMHx significant for HTN, HLD, T2DM, B12 injections and glioblastoma (s/p craniotomy, XRT and chemotherapy, maintained on Temodar). Wife reports pain in chest after swallowing that they attributed to reflux. Clinical Impression: Pt has an oropharyngeal dysphagia with oral phase marked by slow mastication, anterior spillage of thin liquids primarily, and diffuse oral residue that he does reduce spontaneously with subsequent swallows. Pharyngeally he has reduced  hyolaryngeal movement, diminished pharyngeal stripping wave, and reduced base of tongue retraction. He has almost no epiglottic inversion with thinner consistencies. He has residue throughout his pharynx with all consistencies, although it does increase as boluses become thicker. Penetration to the vocal folds and even trace amounts of aspiration (PAS 8) occur with thin liquids. Penetration is generally more transient with nectar thick liquids via cup sips, and can be cleared with a cued throat clear when it doesn't clear on its own. Penetration reaches the vocal folds when trying to introduce a chin tuck. Although there is no penetration with honey thick liquids, there is also more pharyngeal residue. Recommend starting with a full liquid diet thickened to nectar thick liquids, consumed via cup. Note that throughout the study, pt was also quite impulsive at times, and would benefit from full supervision to slow his pacing and adhere to strategies listed below. DIGEST Swallow Severity Rating*  Safety: 2  Efficiency: 1  Overall Pharyngeal Swallow Severity: 2 1: mild; 2: moderate; 3: severe; 4: profound *The Dynamic Imaging Grade of Swallowing Toxicity is standardized for the head and neck cancer population, however, demonstrates promising clinical applications across populations to standardize the clinical rating of pharyngeal swallow safety and severity. Factors that may increase risk of adverse event in presence of aspiration Rubye Oaks & Clearance Coots 2021):  Factors that may increase risk of adverse event in presence of aspiration Rubye Oaks & Clearance Coots 2021): Limited mobility; Frail or deconditioned; Weak cough; Poor general health and/or compromised immunity; Respiratory or GI disease Recommendations/Plan: Swallowing Evaluation Recommendations Swallowing Evaluation Recommendations Recommendations: PO diet PO Diet Recommendation: Full liquid diet; Mildly thick liquids (Level 2, nectar thick) Liquid Administration via: Cup; No  straw Medication Administration: Crushed with puree Supervision: Staff to assist with self-feeding; Full assist for feeding Swallowing strategies  : Minimize environmental distractions; Slow rate; Small bites/sips; Multiple dry swallows after each bite/sip; Clear throat intermittently Postural changes: Position pt fully upright for meals; Stay upright 30-60 min after meals Oral care recommendations: Oral care BID (2x/day) Caregiver Recommendations: Avoid jello, ice cream, thin soups, popsicles; Remove water pitcher; Have oral suction available Treatment Plan Treatment Plan Treatment recommendations: Therapy as outlined in treatment plan below Follow-up recommendations: Skilled nursing-short term rehab (<3 hours/day) Functional status assessment: Patient has had a recent decline in their functional status and demonstrates the ability to make significant improvements in function in a reasonable and predictable amount of time. Treatment frequency: Min 2x/week Treatment duration: 2 weeks Interventions: Aspiration precaution training; Compensatory techniques; Patient/family education; Oropharyngeal exercises; Trials of upgraded texture/liquids; Diet toleration management by SLP Recommendations Recommendations for follow up therapy are one component of a multi-disciplinary discharge planning process, led by the attending physician.  Recommendations may be updated based on patient status, additional functional criteria and insurance authorization. Assessment: Orofacial Exam: Orofacial Exam Oral Cavity: Oral Hygiene: WFL Oral Cavity - Dentition: Adequate natural dentition; Missing dentition Orofacial Anatomy: WFL Anatomy: Anatomy: WFL Boluses Administered: Boluses Administered Boluses Administered: Thin liquids (Level 0); Mildly thick liquids (Level 2, nectar thick); Moderately thick liquids (Level 3, honey thick); Puree; Solid  Oral Impairment Domain: Oral Impairment Domain Lip Closure: Escape beyond mid-chin Tongue  control during bolus hold: Posterior escape of greater than half of bolus Bolus preparation/mastication: Slow prolonged chewing/mashing with complete recollection Bolus transport/lingual motion: Brisk tongue motion Oral residue: Majority of bolus remaining Location of oral residue : Tongue; Floor of mouth Initiation of pharyngeal swallow : Valleculae  Pharyngeal Impairment Domain: Pharyngeal Impairment Domain Soft palate elevation: No bolus between soft palate (SP)/pharyngeal wall (PW) Laryngeal elevation: Partial superior movement of thyroid cartilage/partial approximation of arytenoids to epiglottic petiole Anterior hyoid excursion: Partial anterior movement Epiglottic movement: No inversion Laryngeal vestibule closure: Incomplete, narrow column air/contrast in laryngeal vestibule Pharyngeal stripping wave : Present - diminished Pharyngeal contraction (A/P view only): N/A Pharyngoesophageal segment opening: Complete distension and complete duration, no obstruction of flow Tongue base retraction: Wide column of contrast or air between tongue base and PPW Pharyngeal residue: Collection of residue within or on pharyngeal structures Location of pharyngeal residue: Valleculae; Pyriform sinuses; Aryepiglottic folds; Pharyngeal wall  Esophageal Impairment Domain: No data recorded Pill: No data recorded Penetration/Aspiration Scale Score: Penetration/Aspiration Scale Score 1.  Material does not enter airway: Moderately thick liquids (Level 3, honey thick); Solid 2.  Material enters airway, remains ABOVE vocal cords then ejected out: Mildly thick liquids (Level 2, nectar thick); Puree 8.  Material enters airway, passes BELOW cords without attempt by patient to eject out (silent aspiration) : Thin liquids (Level 0) Compensatory Strategies: Compensatory Strategies Compensatory strategies: Yes Chin tuck: Ineffective Ineffective Chin Tuck: Thin liquid (Level 0); Mildly thick liquid (Level 2, nectar thick)   General  Information: Caregiver present: No  Diet Prior to this Study: NPO   Temperature : Normal   Respiratory Status: WFL   Supplemental O2:  None (Room air)   History of Recent Intubation: Yes  Behavior/Cognition: Alert; Cooperative; Pleasant mood Self-Feeding Abilities: Able to self-feed; Other (Comment) (assist given for impulsivity) Baseline vocal quality/speech: Dysphonic; Hypophonia/low volume Volitional Cough: Able to elicit Volitional Swallow: Able to elicit Exam Limitations: No limitations Goal Planning: Prognosis for improved oropharyngeal function: Good No data recorded No data recorded Patient/Family Stated Goal: wants a strawberry milkshake Consulted and agree with results and recommendations: Patient; Nurse; Dietitian Pain: Pain Assessment Pain Assessment: Faces Faces Pain Scale: 2 Facial Expression: 0 Body Movements: 0 Muscle Tension: 0 Compliance with ventilator (intubated pts.): N/A Vocalization (extubated pts.): 0 CPOT Total: 0 Pain Location: IV site, R arm Pain Descriptors / Indicators: Sore Pain Intervention(s): Monitored during session End of Session: Start Time:SLP Start Time (ACUTE ONLY): 1502 Stop Time: SLP Stop Time (ACUTE ONLY): 1517 Time Calculation:SLP Time Calculation (min) (ACUTE ONLY): 15 min Charges: SLP Evaluations $ SLP Speech Visit: 1 Visit SLP Evaluations $BSS Swallow: 1 Procedure $MBS Swallow: 1 Procedure SLP visit diagnosis: SLP Visit Diagnosis: Dysphagia, oropharyngeal phase (R13.12) Past Medical History: Past Medical History: Diagnosis Date  B12 deficiency   monthly shots  Diabetes mellitus   Glaucoma suspect   Glioblastoma (HCC)   Hyperlipemia   Hypertension  Past Surgical History: Past Surgical History: Procedure Laterality Date  APPLICATION OF CRANIAL NAVIGATION Right 05/12/2023  Procedure: APPLICATION OF CRANIAL NAVIGATION;  Surgeon: Dawley, Alan Mulder, DO;  Location: [facility] OR;  Service: Neurosurgery;  Laterality: Right;  CRANIOTOMY Right 05/12/2023  Procedure: RIGHT STERIOSTACTIC  FRONTAL CRANIOTOMY FOR TUMOR RESECTION;  Surgeon: Bethann Goo, DO;  Location: [facility] OR;  Service: Neurosurgery;  Laterality: Right; Mahala Menghini., M.A. CCC-SLP Acute Rehabilitation Services Office [phone] Secure chat preferred 10/30/2023, 4:06 PM  Overnight EEG with video Result Date: 10/28/2023 Charlsie Quest, MD     10/29/2023  9:48 AM Patient Name: Alex Church MRN: [id number] Epilepsy Attending: Charlsie Quest Referring Physician/Provider: Rejeana Brock, MD Duration: 10/27/2023 2303 to 10/28/2023 2303 Patient history:  78 y.o. male with new onset status epilepticus. EEG to evaluate for seizure Level of alertness:  comatose AEDs during EEG study: LEV, PHT, Propofol Technical aspects: This EEG study was done with scalp electrodes positioned according to the 10-20 International system of electrode placement. Electrical activity was reviewed with band pass filter of 1-70Hz , sensitivity of 7 uV/mm, display speed of 13mm/sec with a 60Hz  notched filter applied as appropriate. EEG data were recorded continuously and digitally stored.  Video monitoring was available and reviewed as appropriate. Description: EEG showed continuous generalized and lateralized right hemisphere 3 to 6 Hz theta-delta slowing admixed with 15 to 18 Hz beta activity distributed symmetrically and diffusely. Hyperventilation and photic stimulation were not performed.   ABNORMALITY - Continuous slow, generalized and lateralized right hemisphere IMPRESSION: This study is suggestive of cortical dysfunction arising from right hemisphere likely secondary to underlying structural abnormality. Additionally there is severe diffuse encephalopathy likely related to sedation. No seizures or epileptiform discharges were seen throughout the recording. Charlsie Quest   DG Chest Port 1 View Result Date: 10/28/2023 CLINICAL DATA:  78 year old male is intubated.  Seizure. EXAM: PORTABLE CHEST 1 VIEW COMPARISON:  Portable chest yesterday and  earlier. FINDINGS: Portable AP semi upright view at 0536 hours. Endotracheal tube tip in good position between the level the clavicles and carina. Enteric tube courses to the left upper quadrant as before. Stable low lung volumes. Normal cardiac size and mediastinal contours. Allowing for portable technique the  lungs are clear. Paucity of bowel gas. Stable visualized osseous structures. IMPRESSION: 1.  Stable lines and tubes. 2. Low lung volumes, no other acute cardiopulmonary abnormality. Electronically Signed   By: Odessa Fleming M.D.   On: 10/28/2023 06:03   CT Head Wo Contrast Result Date: 10/27/2023 CLINICAL DATA:  Seizure disorder EXAM: CT HEAD WITHOUT CONTRAST TECHNIQUE: Contiguous axial images were obtained from the base of the skull through the vertex without intravenous contrast. RADIATION DOSE REDUCTION: This exam was performed according to the departmental dose-optimization program which includes automated exposure control, adjustment of the mA and/or kV according to patient size and/or use of iterative reconstruction technique. COMPARISON:  07/11/2023 FINDINGS: Brain: Decreased size of right anterior extra-axial collection subjacent to craniotomy site. Postoperative changes of the right frontal lobe with areas of encephalomalacia and gliosis. Periventricular hypoattenuation consistent with chronic microvascular disease. No acute intracranial hemorrhage. Vascular: Atherosclerotic calcification of the internal carotid and vertebral arteries at the skull base. Skull: Remote right-sided craniotomy. Sinuses/Orbits: No acute finding. Other: None. IMPRESSION: 1. Decreased size of right anterior extra-axial collection subjacent to craniotomy site. 2. Postoperative changes of the right frontal lobe with areas of encephalomalacia and gliosis. Electronically Signed   By: Deatra Robinson M.D.   On: 10/27/2023 23:41   DG Abdomen 1 View Result Date: 10/27/2023 CLINICAL DATA:  OG tube placement EXAM: ABDOMEN - 1 VIEW  COMPARISON:  CT 05/06/2023 FINDINGS: Enteric tube tip and side port overlie the proximal stomach. Upper gas pattern is unremarkable. 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 IMPRESSION: Enteric tube tip and side port overlie the proximal stomach. Electronically Signed   By: Jasmine Pang M.D.   On: 10/27/2023 21:44   DG Chest Portable 1 View Result Date: 10/27/2023 CLINICAL DATA:  Intubated EXAM: PORTABLE CHEST 1 VIEW COMPARISON:  07/11/2023 FINDINGS: Endotracheal tube tip is about 4.2 cm superior to carina. Enteric tube tip below the diaphragm but incompletely assessed. Low lung volumes with mild basilar atelectasis. Normal cardiac size. No pneumothorax IMPRESSION: Endotracheal tube tip about 4.2 cm superior to carina. Low lung volumes with mild basilar atelectasis. Electronically Signed   By: Jasmine Pang M.D.   On: 10/27/2023 21:43   MR BRAIN W WO CONTRAST Result Date: 10/07/2023 CLINICAL DATA:  Glioblastoma, assess treatment response EXAM: MRI HEAD WITHOUT AND WITH CONTRAST TECHNIQUE: Multiplanar, multiecho pulse sequences of the brain and surrounding structures were obtained without and with intravenous contrast. CONTRAST:  8mL GADAVIST GADOBUTROL 1 MMOL/ML IV SOLN COMPARISON:  08/20/2023 FINDINGS: Brain: Status post right frontal craniotomy with subjacent resection cavity in the right frontal lobe. Thin, somewhat irregular enhancement about the resection cavity appears unchanged from the prior exam slightly increased associated T2 hyperintense signal, which extends more posteriorly into the right frontal periventricular white matter (series 9, image 32 and 36), more anteriorly into the right frontal lobe (series 9, image 32), and has increased in thickness along the left frontal horn (series 9, image 32). No new areas of abnormal enhancement. Redemonstrated extra-axial collection, subjacent to the cranioplasty, slightly decreased in size, now measuring to  7 mm, previous 11 mm. An additional extra-axial collection anterior to the right frontal lobe measures up to 8 mm, unchanged. Redemonstrated dural thickening overlying the right greater than left cerebral hemispheres, unchanged and likely postoperative. No evidence of acute infarct, hemorrhage, midline shift, or hydrocephalus. Pituitary and craniocervical junction within normal limits. Vascular: Normal arterial flow voids. Normal arterial and venous enhancement. Skull and upper cervical spine: Right  frontal craniotomy. Otherwise normal marrow signal. Sinuses/Orbits: Clear paranasal sinuses. No acute finding in the orbits. Other: Trace fluid in the mastoid air cells. IMPRESSION: 1. Postoperative changes in the right frontal lobe, with unchanged thin irregular enhancement about the resection but slightly increased surrounding T2 hyperintense signal, which could represent post treatment change but is concerning for progressive, nonenhancing, infiltrative tumor. 2. Redemonstrated extra-axial collection subjacent to the cranioplasty, slightly decreased in size, now measuring to 7 mm, previous 11 mm. An additional extra-axial collection anterior to the right frontal lobe measures up to 8 mm, unchanged. Electronically Signed   By: Wiliam Ke M.D.   On: 10/07/2023 17:15    Subjective: Patient was seen and examined at bedside. Overnight events noted.   Patient reports doing much better and wants to be discharged.   Patient being discharged to skilled nursing facility for rehab.  Discharge Exam: Vitals:   11/05/23 0316 11/05/23 0801  BP: 138/84 (!) 151/81  Pulse: (!) 101 94  Resp: 18 16  Temp: 98.2 F (36.8 C) 98 F (36.7 C)  SpO2: 96% 98%   Vitals:   11/04/23 2326 11/05/23 0316 11/05/23 0500 11/05/23 0801  BP: 131/67 138/84  (!) 151/81  Pulse: (!) 107 (!) 101  94  Resp: 18 18  16   Temp: 98.4 F (36.9 C) 98.2 F (36.8 C)  98 F (36.7 C)  TempSrc: Oral Oral    SpO2: 95% 96%  98%  Weight:   79.4  kg   Height:        General: Pt is alert, awake, not in acute distress Cardiovascular: RRR, S1/S2 +, no rubs, no gallops Respiratory: CTA bilaterally, no wheezing, no rhonchi Abdominal: Soft, NT, ND, bowel sounds + Extremities: no edema, no cyanosis    The results of significant diagnostics from this hospitalization (including imaging, microbiology, ancillary and laboratory) are listed below for reference.     Microbiology: Recent Results (from the past 240 hours)  MRSA Next Gen by PCR, Nasal     Status: Abnormal   Collection Time: 10/27/23 10:29 PM   Specimen: Nasal Mucosa; Nasal Swab  Result Value Ref Range Status   MRSA by PCR Next Gen DETECTED (A) NOT DETECTED Final    Comment: RESULT CALLED TO, READ BACK BY AND VERIFIED WITH: J TIRRELL,RN@0200  10/28/23 MK (NOTE) The GeneXpert MRSA Assay (FDA approved for NASAL specimens only), is one component of a comprehensive MRSA colonization surveillance program. It is not intended to diagnose MRSA infection nor to guide or monitor treatment for MRSA infections. Test performance is not FDA approved in patients less than 83 years old. Performed at [facility], [address], [place], [place] [address]   Urine Culture     Status: Abnormal   Collection Time: 10/28/23 10:10 AM   Specimen: Urine, Random  Result Value Ref Range Status   Specimen Description URINE, RANDOM  Final   Special Requests   Final    NONE Reflexed from [id number] Performed at [facility], [address] [place], [place] [address]    Culture >=100,000 COLONIES/mL PSEUDOMONAS AERUGINOSA (A)  Final   Report Status 10/30/2023 FINAL  Final   Organism ID, Bacteria PSEUDOMONAS AERUGINOSA (A)  Final      Susceptibility   Pseudomonas aeruginosa - MIC*    CEFTAZIDIME 4 SENSITIVE Sensitive     CIPROFLOXACIN <=0.25 SENSITIVE Sensitive     GENTAMICIN 2 SENSITIVE Sensitive     IMIPENEM 2 SENSITIVE Sensitive     * >=  100,000 COLONIES/mL PSEUDOMONAS AERUGINOSA      Labs: BNP (last 3 results) No results for input(s): "BNP" in the last 8760 hours. Basic Metabolic Panel: Recent Labs  Lab 10/29/23 1703 10/30/23 0506 11/01/23 1308 11/02/23 0701 11/03/23 0545 11/04/23 0634 11/05/23 0711  NA  --    < > 140 137 134* 137 135  K  --    < > 3.4* 3.4* 3.5 3.9 3.7  CL  --    < > 105 103 101 103 102  CO2  --    < > 25 26 25 26 24   GLUCOSE  --    < > 163* 127* 205* 176* 134*  BUN  --    < > 15 14 12 16 17   CREATININE  --    < > 0.85 0.82 0.65 0.74 0.73  CALCIUM  --    < > 8.8* 8.7* 8.6* 8.9 8.9  MG 1.7   < > 1.4* 1.3* 1.5* 1.6* 1.6*  PHOS 2.0*  --   --   --  2.7 3.0 3.6   < > = values in this interval not displayed.   Liver Function Tests: Recent Labs  Lab 11/03/23 0545 11/04/23 0634  ALBUMIN 2.2* 2.3*   No results for input(s): "LIPASE", "AMYLASE" in the last 168 hours. No results for input(s): "AMMONIA" in the last 168 hours. CBC: No results for input(s): "WBC", "NEUTROABS", "HGB", "HCT", "MCV", "PLT" in the last 168 hours. Cardiac Enzymes: No results for input(s): "CKTOTAL", "CKMB", "CKMBINDEX", "TROPONINI" in the last 168 hours. BNP: Invalid input(s): "POCBNP" CBG: Recent Labs  Lab 11/04/23 1606 11/04/23 1936 11/04/23 2325 11/05/23 0316 11/05/23 0819  GLUCAP 366* 341* 275* 254* 124*   D-Dimer Recent Labs    11/03/23 1949  DDIMER 2.11*   Hgb A1c No results for input(s): "HGBA1C" in the last 72 hours. Lipid Profile Recent Labs    11/03/23 0545  TRIG 107   Thyroid function studies No results for input(s): "TSH", "T4TOTAL", "T3FREE", "THYROIDAB" in the last 72 hours.  Invalid input(s): "FREET3" Anemia work up No results for input(s): "VITAMINB12", "FOLATE", "FERRITIN", "TIBC", "IRON", "RETICCTPCT" in the last 72 hours. Urinalysis    Component Value Date/Time   COLORURINE YELLOW 11/03/2023 1415   APPEARANCEUR HAZY (A) 11/03/2023 1415   LABSPEC 1.011 11/03/2023 1415   PHURINE 8.0 11/03/2023 1415   GLUCOSEU  NEGATIVE 11/03/2023 1415   GLUCOSEU NEGATIVE 06/25/2023 1002   HGBUR SMALL (A) 11/03/2023 1415   BILIRUBINUR NEGATIVE 11/03/2023 1415   KETONESUR NEGATIVE 11/03/2023 1415   PROTEINUR NEGATIVE 11/03/2023 1415   UROBILINOGEN 0.2 06/25/2023 1002   NITRITE NEGATIVE 11/03/2023 1415   LEUKOCYTESUR LARGE (A) 11/03/2023 1415   Sepsis Labs No results for input(s): "WBC" in the last 168 hours.  Invalid input(s): "PROCALCITONIN", "LACTICIDVEN" Microbiology Recent Results (from the past 240 hours)  MRSA Next Gen by PCR, Nasal     Status: Abnormal   Collection Time: 10/27/23 10:29 PM   Specimen: Nasal Mucosa; Nasal Swab  Result Value Ref Range Status   MRSA by PCR Next Gen DETECTED (A) NOT DETECTED Final    Comment: RESULT CALLED TO, READ BACK BY AND VERIFIED WITH: J TIRRELL,RN@0200  10/28/23 MK (NOTE) The GeneXpert MRSA Assay (FDA approved for NASAL specimens only), is one component of a comprehensive MRSA colonization surveillance program. It is not intended to diagnose MRSA infection nor to guide or monitor treatment for MRSA infections. Test performance is not FDA approved in patients less than 51 years old.  Performed at [facility], [address], [place], [place] [address]   Urine Culture     Status: Abnormal   Collection Time: 10/28/23 10:10 AM   Specimen: Urine, Random  Result Value Ref Range Status   Specimen Description URINE, RANDOM  Final   Special Requests   Final    NONE Reflexed from [id number] Performed at [facility], [address] [place], [place] [address]    Culture >=100,000 COLONIES/mL PSEUDOMONAS AERUGINOSA (A)  Final   Report Status 10/30/2023 FINAL  Final   Organism ID, Bacteria PSEUDOMONAS AERUGINOSA (A)  Final      Susceptibility   Pseudomonas aeruginosa - MIC*    CEFTAZIDIME 4 SENSITIVE Sensitive     CIPROFLOXACIN <=0.25 SENSITIVE Sensitive     GENTAMICIN 2 SENSITIVE Sensitive     IMIPENEM 2 SENSITIVE Sensitive     * >=100,000 COLONIES/mL  PSEUDOMONAS AERUGINOSA     Time coordinating discharge: Over 30 minutes  SIGNED:   Willeen Niece, MD  [facility] 11/05/2023, 12:09 PM Pager   If 7PM-7AM, please contact night-coverage

## 2023-11-05 NOTE — Progress Notes (Signed)
 OT Cancellation Note  Patient Details Name: CARLOUS OLIVARES MRN: [id number] DOB: May 14, 1946   Cancelled Treatment:    Reason Eval/Treat Not Completed: Other (comment).  Awaiting transport to SNF.    Jenisa Monty D Kahlen Boyde 11/05/2023, 2:21 PM 11/05/2023  RP, OTR/L  Acute Rehabilitation Services  Office:  [phone]

## 2023-11-05 NOTE — Telephone Encounter (Signed)
 Patient is being discharged from hospital today, is going to SNF [facility]).  Dr Barbaraann Cao would like to see him next week for labs & MD visit.  Scheduling request sent.

## 2023-11-05 NOTE — Progress Notes (Signed)
 Attempted to call report to [facility] multiple times (8 total) without successful.

## 2023-11-09 DIAGNOSIS — R1312 Dysphagia, oropharyngeal phase: Secondary | ICD-10-CM | POA: Diagnosis not present

## 2023-11-09 DIAGNOSIS — G40509 Epileptic seizures related to external causes, not intractable, without status epilepticus: Secondary | ICD-10-CM | POA: Diagnosis not present

## 2023-11-09 DIAGNOSIS — E440 Moderate protein-calorie malnutrition: Secondary | ICD-10-CM | POA: Diagnosis not present

## 2023-11-09 DIAGNOSIS — E119 Type 2 diabetes mellitus without complications: Secondary | ICD-10-CM | POA: Diagnosis not present

## 2023-11-10 ENCOUNTER — Other Ambulatory Visit (HOSPITAL_COMMUNITY): Payer: Self-pay

## 2023-11-10 ENCOUNTER — Telehealth: Payer: Self-pay | Admitting: Internal Medicine

## 2023-11-10 ENCOUNTER — Other Ambulatory Visit: Payer: Self-pay

## 2023-11-10 ENCOUNTER — Encounter: Payer: Self-pay | Admitting: Internal Medicine

## 2023-11-10 NOTE — Telephone Encounter (Signed)
 Marland Kitchen

## 2023-11-11 ENCOUNTER — Telehealth: Payer: Self-pay

## 2023-11-11 DIAGNOSIS — C719 Malignant neoplasm of brain, unspecified: Secondary | ICD-10-CM | POA: Diagnosis not present

## 2023-11-11 NOTE — Telephone Encounter (Signed)
 Notified the pt wife of her completed FMLA forms and they were faxed. I email her copy as requested.

## 2023-11-12 DIAGNOSIS — I10 Essential (primary) hypertension: Secondary | ICD-10-CM | POA: Diagnosis not present

## 2023-11-12 DIAGNOSIS — E1169 Type 2 diabetes mellitus with other specified complication: Secondary | ICD-10-CM | POA: Diagnosis not present

## 2023-11-12 DIAGNOSIS — C719 Malignant neoplasm of brain, unspecified: Secondary | ICD-10-CM | POA: Diagnosis not present

## 2023-11-12 DIAGNOSIS — G40909 Epilepsy, unspecified, not intractable, without status epilepticus: Secondary | ICD-10-CM | POA: Diagnosis not present

## 2023-11-16 ENCOUNTER — Encounter: Payer: Self-pay | Admitting: *Deleted

## 2023-11-16 ENCOUNTER — Inpatient Hospital Stay: Admitting: Internal Medicine

## 2023-11-16 ENCOUNTER — Telehealth: Payer: Self-pay | Admitting: Internal Medicine

## 2023-11-16 ENCOUNTER — Inpatient Hospital Stay: Attending: Internal Medicine

## 2023-11-16 ENCOUNTER — Telehealth: Payer: Self-pay | Admitting: *Deleted

## 2023-11-16 ENCOUNTER — Other Ambulatory Visit (HOSPITAL_COMMUNITY): Payer: Self-pay

## 2023-11-16 VITALS — BP 105/95 | HR 110 | Temp 97.2°F | Resp 16

## 2023-11-16 DIAGNOSIS — C711 Malignant neoplasm of frontal lobe: Secondary | ICD-10-CM | POA: Insufficient documentation

## 2023-11-16 DIAGNOSIS — Z79899 Other long term (current) drug therapy: Secondary | ICD-10-CM

## 2023-11-16 DIAGNOSIS — R569 Unspecified convulsions: Secondary | ICD-10-CM

## 2023-11-16 DIAGNOSIS — C719 Malignant neoplasm of brain, unspecified: Secondary | ICD-10-CM

## 2023-11-16 LAB — CBC WITH DIFFERENTIAL (CANCER CENTER ONLY)
Abs Immature Granulocytes: 0.39 10*3/uL — ABNORMAL HIGH (ref 0.00–0.07)
Basophils Absolute: 0.1 10*3/uL (ref 0.0–0.1)
Basophils Relative: 0 %
Eosinophils Absolute: 0 10*3/uL (ref 0.0–0.5)
Eosinophils Relative: 0 %
HCT: 34.1 % — ABNORMAL LOW (ref 39.0–52.0)
Hemoglobin: 11.6 g/dL — ABNORMAL LOW (ref 13.0–17.0)
Immature Granulocytes: 2 %
Lymphocytes Relative: 4 %
Lymphs Abs: 0.7 10*3/uL (ref 0.7–4.0)
MCH: 30.9 pg (ref 26.0–34.0)
MCHC: 34 g/dL (ref 30.0–36.0)
MCV: 90.9 fL (ref 80.0–100.0)
Monocytes Absolute: 0.7 10*3/uL (ref 0.1–1.0)
Monocytes Relative: 4 %
Neutro Abs: 15.2 10*3/uL — ABNORMAL HIGH (ref 1.7–7.7)
Neutrophils Relative %: 90 %
Platelet Count: 224 10*3/uL (ref 150–400)
RBC: 3.75 MIL/uL — ABNORMAL LOW (ref 4.22–5.81)
RDW: 16.8 % — ABNORMAL HIGH (ref 11.5–15.5)
WBC Count: 17 10*3/uL — ABNORMAL HIGH (ref 4.0–10.5)
nRBC: 0 % (ref 0.0–0.2)

## 2023-11-16 LAB — CMP (CANCER CENTER ONLY)
ALT: 17 U/L (ref 0–44)
AST: 9 U/L — ABNORMAL LOW (ref 15–41)
Albumin: 3.3 g/dL — ABNORMAL LOW (ref 3.5–5.0)
Alkaline Phosphatase: 61 U/L (ref 38–126)
Anion gap: 9 (ref 5–15)
BUN: 45 mg/dL — ABNORMAL HIGH (ref 8–23)
CO2: 25 mmol/L (ref 22–32)
Calcium: 8.9 mg/dL (ref 8.9–10.3)
Chloride: 97 mmol/L — ABNORMAL LOW (ref 98–111)
Creatinine: 0.88 mg/dL (ref 0.61–1.24)
GFR, Estimated: >60 mL/min (ref >60)
Glucose, Bld: 275 mg/dL — ABNORMAL HIGH (ref 70–99)
Potassium: 5 mmol/L (ref 3.5–5.1)
Sodium: 131 mmol/L — ABNORMAL LOW (ref 135–145)
Total Bilirubin: 0.5 mg/dL (ref 0.0–1.2)
Total Protein: 5.7 g/dL — ABNORMAL LOW (ref 6.5–8.1)

## 2023-11-16 MED ORDER — DIVALPROEX SODIUM ER 500 MG PO TB24
ORAL_TABLET | ORAL | 0 refills | Status: DC
  Filled 2023-11-16: qty 123, 31d supply, fill #0

## 2023-11-16 MED ORDER — DEXAMETHASONE 4 MG PO TABS: 4.0000 mg | ORAL_TABLET | Freq: Every day | ORAL | Status: DC

## 2023-11-16 NOTE — Telephone Encounter (Signed)
"  Efosa L Schoenfeld's spouse [name] [phone]).  Office completed an FMLA form for me.  Received a secure e-mail, created a password yet no document is there.  Advised no new password needed.  Use password already established for your email." Noted completed FMLA D[employer] form in Media, Notes & Correspondence.  Sent patient message through MyChart portal with form attached.  11:10 am Chibuikem Thang confirmed receipt of four pages.  No fax number entered on Excel form tracker for this request.  Advised she submit to employer.  No further questions or needs.

## 2023-11-16 NOTE — Telephone Encounter (Signed)
 Patient Scheduled appts. Patient is aware of all appt details.

## 2023-11-16 NOTE — Progress Notes (Signed)
 [facility] at [facility] [address]  [place], [place] [address] [phone]   Interval Evaluation  Date of Service: 11/16/23 Patient Name: LYNDAL REGGIO Patient MRN: [id number] Patient DOB: Nov 20, 1945 Provider: Henreitta Leber, MD  Identifying Statement:  CHRISTOPHER GLASSCOCK is a 78 y.o. male with right frontal glioblastoma   Oncologic History: Oncology History  Glioblastoma, IDH-wildtype (HCC)  05/12/2023 Surgery   Right frontal craniotomy, resection with Dr. Jake Samples; path is glioblastoma IDH-wt   06/09/2023 -  Chemotherapy   Patient is on Treatment Plan : BRAIN GLIOBLASTOMA Radiation Therapy With Concurrent Temozolomide 75 mg/m2 Daily Followed By Sequential Maintenance Temozolomide x 6-12 cycles          Interval History: KAZ AULD presents today for follow up after recent admission for seizures, status epilepticus, now s/p 2 cycles of TMZ.  No further seizures since discharge on Keppra 1000mg  twice per day and decadron 4mg  twice per day.  Overall condition has declined, he is extremely irritable with behavioral issues since increasing the decadron and keppra.  He otherwise describes no improvement left leg weakness since increasing the decadron.  He remains at the rehab facility.  H+P (06/02/23) Patient presented to neurologic attention in late August with several days history of left sided weakness, confusion.  Wife noticed he had difficulty getting out of bed, was dragging left side.  This was on top of several months history of progressive confusion, short term memory impairment.  CNS imaging demonstrated a large right frontal mass.  He underwent craniotomy and resection with Dr. Jake Samples, path demonstrated glioblastoma.  Since surgery he has been doing well, walking independently.  Denies seizures, headaches.  He is off decadron.   Medications: Current Outpatient Medications on File Prior to Visit  Medication Sig Dispense Refill   acetaminophen  (TYLENOL) 500 MG tablet Take 1,000 mg by mouth every 6 (six) hours as needed for mild pain (pain score 1-3) (Tries to only take twice a week).     allopurinol (ZYLOPRIM) 100 MG tablet Take 1 tablet (100 mg total) by mouth 2 (two) times daily. 180 tablet 1   atorvastatin (LIPITOR) 10 MG tablet Take 1 tablet (10 mg total) by mouth at bedtime. 90 tablet 1   carvedilol (COREG) 12.5 MG tablet Take 0.5 tablets (6.25 mg total) by mouth 2 (two) times daily with a meal. 90 tablet 1   cloNIDine (CATAPRES) 0.1 MG tablet Take 1 tablet (0.1 mg total) by mouth 2 (two) times daily. 180 tablet 1   dexamethasone (DECADRON) 4 MG tablet Take 1 tablet (4 mg total) by mouth 2 (two) times daily with a meal. 60 tablet 0   glucose blood test strip Check blood sugars once daily 100 each 12   Lancets (ONETOUCH ULTRASOFT) lancets Check bloods sugars once daily 100 each 12   levETIRAcetam (KEPPRA) 1000 MG tablet Take 1 tablet (1,000 mg total) by mouth 2 (two) times daily. 60 tablet 0   lisinopril (ZESTRIL) 20 MG tablet Take 20 mg by mouth at bedtime.     melatonin 5 MG TABS Take 1 tablet (5 mg total) by mouth at bedtime. (Patient taking differently: Take 5 mg by mouth at bedtime as needed (sleep).) 30 tablet 0   metFORMIN (GLUCOPHAGE) 1000 MG tablet Take 1 tablet (1,000 mg total) by mouth 2 (two) times daily with a meal. (Patient taking differently: Take 500 mg by mouth 2 (two) times daily with a meal.) 180 tablet 1   ondansetron (  ZOFRAN) 8 MG tablet Take 1 tablet (8 mg total) by mouth every 8 (eight) hours as needed for nausea or vomiting. May take 30-60 minutes prior to Temodar administration if nausea/vomiting occurs as needed. 30 tablet 1   pantoprazole (PROTONIX) 40 MG tablet Take 1 tablet (40 mg total) by mouth daily. 30 tablet 1   pioglitazone (ACTOS) 45 MG tablet Take 1 tablet (45 mg total) by mouth daily. 90 tablet 1   QUEtiapine (SEROQUEL) 50 MG tablet Take 1 tablet (50 mg total) by mouth at bedtime. 30 tablet 0    senna (SENOKOT) 8.6 MG TABS tablet Take 1 tablet by mouth daily as needed for mild constipation or moderate constipation.     sitaGLIPtin (JANUVIA) 100 MG tablet Take 1 tablet (100 mg total) by mouth daily. 90 tablet 1   tamsulosin (FLOMAX) 0.4 MG CAPS capsule Take 1 capsule (0.4 mg total) by mouth daily after supper. 90 capsule 1   temozolomide (TEMODAR) 100 MG capsule Take 3 capsules (300 mg total) by mouth at bedtime. Take for 5 days on, then off for 23 days. Repeat every 28 days. Take on an empty stomach to decrease nausea & vomiting. 15 capsule 0   No current facility-administered medications on file prior to visit.    Allergies: No Known Allergies Past Medical History:  Past Medical History:  Diagnosis Date   B12 deficiency    monthly shots   Diabetes mellitus    Glaucoma suspect    Glioblastoma (HCC)    Hyperlipemia    Hypertension    Past Surgical History:  Past Surgical History:  Procedure Laterality Date   APPLICATION OF CRANIAL NAVIGATION Right 05/12/2023   Procedure: APPLICATION OF CRANIAL NAVIGATION;  Surgeon: Dawley, Alan Mulder, DO;  Location: [facility] OR;  Service: Neurosurgery;  Laterality: Right;   CRANIOTOMY Right 05/12/2023   Procedure: RIGHT STERIOSTACTIC FRONTAL CRANIOTOMY FOR TUMOR RESECTION;  Surgeon: Bethann Goo, DO;  Location: [facility] OR;  Service: Neurosurgery;  Laterality: Right;   Social History:  Social History   Socioeconomic History   Marital status: Married    Spouse name: Not on file   Number of children: 0   Years of education: Not on file   Highest education level: GED or equivalent  Occupational History   Occupation: retired from [employer], works for a lab driving  Tobacco Use   Smoking status: Never   Smokeless tobacco: Never  Substance and Sexual Activity   Alcohol use: No   Drug use: No   Sexual activity: Not on file  Other Topics Concern   Not on file  Social History Narrative   Married (2nd marriage)   Lives w/ wife     Has a rescue dog    Social Drivers of Health   Financial Resource Strain: Low Risk  (07/13/2023)   Overall Financial Resource Strain (CARDIA)    Difficulty of Paying Living Expenses: Not hard at all  Food Insecurity: No Food Insecurity (10/31/2023)   Hunger Vital Sign    Worried About Running Out of Food in the Last Year: Never true    Ran Out of Food in the Last Year: Never true  Transportation Needs: No Transportation Needs (10/31/2023)   PRAPARE - Administrator, Civil Service (Medical): No    Lack of Transportation (Non-Medical): No  Physical Activity: Insufficiently Active (07/13/2023)   Exercise Vital Sign    Days of Exercise per Week: 2 days    Minutes of Exercise per Session: 20  min  Stress: Stress Concern Present (07/13/2023)   [employer] of Occupational Health - Occupational Stress Questionnaire    Feeling of Stress : To some extent  Social Connections: Moderately Isolated (10/31/2023)   Social Connection and Isolation Panel [NHANES]    Frequency of Communication with Friends and Family: Twice a week    Frequency of Social Gatherings with Friends and Family: More than three times a week    Attends Religious Services: Never    Database administrator or Organizations: No    Attends Banker Meetings: Never    Marital Status: Married  Catering manager Violence: Not At Risk (10/31/2023)   Humiliation, Afraid, Rape, and Kick questionnaire    Fear of Current or Ex-Partner: No    Emotionally Abused: No    Physically Abused: No    Sexually Abused: No   Family History:  Family History  Problem Relation Age of Onset   Diabetes Maternal Uncle    Heart attack Brother 80   Cancer Mother        intra-abdominal   Cirrhosis Cousin        maternal   Diabetes Cousin        maternal   Stroke Neg Hx    Colon cancer Neg Hx    Prostate cancer Neg Hx     Review of Systems: Constitutional: Doesn't report fevers, chills or abnormal weight loss Eyes: Doesn't report  blurriness of vision Ears, nose, mouth, throat, and face: Doesn't report sore throat Respiratory: Doesn't report cough, dyspnea or wheezes Cardiovascular: Doesn't report palpitation, chest discomfort  Gastrointestinal:  Doesn't report nausea, constipation, diarrhea GU: Doesn't report incontinence Skin: Doesn't report skin rashes Neurological: Per HPI Musculoskeletal: Doesn't report joint pain Behavioral/Psych: Doesn't report anxiety  Physical Exam: Vitals:   11/16/23 0933  BP: (!) 105/95  Pulse: (!) 110  Resp: 16  Temp: (!) 97.2 F (36.2 C)  SpO2: 99%   KPS: 60. General: Wheelchair bound Head: Normal EENT: No conjunctival injection or scleral icterus.  Lungs: Resp effort normal Cardiac: Regular rate Abdomen: Non-distended abdomen Skin: No rashes cyanosis or petechiae. Extremities: No clubbing or edema  Neurologic Exam: Mental Status: Awake, alert, attentive to examiner, but agitated and hyperkinetic. Oriented to self and environment. Language is fluent with intact comprehension. Disorganzed thinking with age advanced psychomotor slowing.   Cranial Nerves: Visual acuity is grossly normal. Visual fields are full. Extra-ocular movements intact. No ptosis. Face is symmetric Motor: Tone and bulk are normal. Power is 4+/5 in left leg. Reflexes are symmetric, no pathologic reflexes present.  Sensory: Intact to light touch Gait: Non ambulatory   Labs: I have reviewed the data as listed    Component Value Date/Time   NA 135 11/05/2023 0711   NA 139 04/06/2022 1047   K 3.7 11/05/2023 0711   CL 102 11/05/2023 0711   CO2 24 11/05/2023 0711   GLUCOSE 134 (H) 11/05/2023 0711   GLUCOSE 147 (H) 08/26/2006 1051   BUN 17 11/05/2023 0711   BUN 17 04/06/2022 1047   CREATININE 0.73 11/05/2023 0711   CREATININE 1.09 10/08/2023 0816   CREATININE 1.09 06/20/2020 1509   CALCIUM 8.9 11/05/2023 0711   PROT 6.6 10/27/2023 2025   ALBUMIN 2.3 (L) 11/04/2023 0634   AST 25 10/27/2023 2025    AST 12 (L) 10/08/2023 0816   ALT 18 10/27/2023 2025   ALT 17 10/08/2023 0816   ALKPHOS 103 10/27/2023 2025   BILITOT 0.7 10/27/2023 2025   BILITOT  0.5 10/08/2023 0816   GFRNONAA >60 11/05/2023 0711   GFRNONAA >60 10/08/2023 0816   GFRAA 126 08/26/2007 0943   Lab Results  Component Value Date   WBC 11.5 (H) 10/29/2023   NEUTROABS 3.3 10/27/2023   HGB 12.6 (L) 10/29/2023   HCT 38.0 (L) 10/29/2023   MCV 90.9 10/29/2023   PLT 212 10/29/2023    Imaging:  [facility] Clinician Interpretation: I have personally reviewed the CNS images as listed.  My interpretation, in the context of the patient's clinical presentation, is likely treatment effect  CT Angio Chest Pulmonary Embolism (PE) W or WO Contrast Result Date: 11/04/2023 CLINICAL DATA:  Pulmonary embolism (PE) suspected, high prob EXAM: CT ANGIOGRAPHY CHEST WITH CONTRAST TECHNIQUE: Multidetector CT imaging of the chest was performed using the standard protocol during bolus administration of intravenous contrast. Multiplanar CT image reconstructions and MIPs were obtained to evaluate the vascular anatomy. RADIATION DOSE REDUCTION: This exam was performed according to the departmental dose-optimization program which includes automated exposure control, adjustment of the mA and/or kV according to patient size and/or use of iterative reconstruction technique. CONTRAST:  75mL OMNIPAQUE IOHEXOL 350 MG/ML SOLN COMPARISON:  07/11/2023, 10/28/2023 FINDINGS: Cardiovascular: This is a technically adequate evaluation of the pulmonary vasculature. No filling defects or pulmonary emboli. The heart is unremarkable without pericardial effusion. No evidence of thoracic aortic aneurysm or dissection. Atherosclerosis of the aorta and coronary vasculature. Mediastinum/Nodes: No enlarged mediastinal, hilar, or axillary lymph nodes. Thyroid gland, trachea, and esophagus demonstrate no significant findings. Lungs/Pleura: Dependent lower lobe atelectasis, left greater  than right. Trace left pleural effusion. No airspace disease or pneumothorax. Central airways are patent. Upper Abdomen: Stable calcifications within the gallbladder. Stable cyst or hemangioma within the spleen. Musculoskeletal: No acute or destructive bony abnormalities. Reconstructed images demonstrate no additional findings. Review of the MIP images confirms the above findings. IMPRESSION: 1. No evidence of pulmonary embolus. 2. Trace left pleural effusion, with dependent lower lobe atelectasis left greater than right. 3. Stable gallbladder calcifications consistent with cholelithiasis or porcelain gallbladder. 4. Aortic Atherosclerosis (ICD10-I70.0). Coronary artery atherosclerosis. Electronically Signed   By: Sharlet Salina M.D.   On: 11/04/2023 18:54   CT HEAD WO CONTRAST ( ) Result Date: 10/31/2023 CLINICAL DATA:  Brain/CNS neoplasm, staging EXAM: CT HEAD WITHOUT CONTRAST TECHNIQUE: Contiguous axial images were obtained from the base of the skull through the vertex without intravenous contrast. RADIATION DOSE REDUCTION: This exam was performed according to the departmental dose-optimization program which includes automated exposure control, adjustment of the mA and/or kV according to patient size and/or use of iterative reconstruction technique. COMPARISON:  Head CT 10/27/2023, brain MR 10/30/2023 FINDINGS: Brain: No hemorrhage. No hydrocephalus. No extra-axial fluid collection. No mastoid. No mass is. There is dural thickening along the craniotomy site. There is encephalomalacia in the right frontal lobe adjacent to the craniotomy site. There is a background of moderate chronic microvascular ischemic change. Generalized volume loss. No hemorrhage. No hydrocephalus. No CT evidence of an acute cortical infarct. 7 mm rightward midline shift, unchanged from prior exam. Vascular: No hyperdense vessel or unexpected calcification. Skull: Postsurgical changes from a right frontal craniotomy. Sinuses/Orbits: No  middle ear or mastoid effusion. Paranasal sinuses are clear. Orbits are unremarkable. Other: None. IMPRESSION: 1.   No acute intracranial abnormality 2. Postsurgical changes from a right frontal craniotomy with unchanged CT appearance of the subjacent resection cavity Electronically Signed   By: Lorenza Cambridge M.D.   On: 10/31/2023 13:01   MR BRAIN W WO CONTRAST Result  Date: 10/30/2023 CLINICAL DATA:  Brain/CNS neoplasm staging EXAM: MRI HEAD WITHOUT AND WITH CONTRAST TECHNIQUE: Multiplanar, multiecho pulse sequences of the brain and surrounding structures were obtained without and with intravenous contrast. CONTRAST:  7.30mL GADAVIST GADOBUTROL 1 MMOL/ML IV SOLN COMPARISON:  10/07/2023 FINDINGS: The examination is severely motion degraded. Brain: Unchanged appearance of small right anterior extra-axial collection. No acute or chronic hemorrhage. There is multifocal hyperintense T2-weighted signal within the white matter. Generalized volume loss. There is a right frontal resection cavity with a small area of contrast enhancement along its medial border measuring 11 x 5 mm. Peripheral enhancement of the cavity is unchanged. The degree of surrounding hyperintense T2-weighted signal is also unchanged. The midline structures are normal. Vascular: Normal flow voids. Skull and upper cervical spine: Normal calvarium and skull base. Visualized upper cervical spine and soft tissues are normal. Sinuses/Orbits:No paranasal sinus fluid levels or advanced mucosal thickening. No mastoid or middle ear effusion. Normal orbits. IMPRESSION: 1. Severely motion degraded examination. 2. Right frontal resection cavity with a small area of contrast enhancement along its medial border measuring 11 x 5 mm. This could represent recurrent disease, but the degree of motion limits assessment. Electronically Signed   By: Deatra Robinson M.D.   On: 10/30/2023 19:06   DG Swallowing Func-Speech Pathology Result Date: 10/30/2023 Table formatting  from the original result was not included. Modified Barium Swallow Study Patient Details Name: SHAKAI DOLLEY MRN: [id number] Date of Birth: [birth year] Today's Date: 10/30/2023 HPI/PMH: HPI: 78 year old man who presented to [facility] 2/18 with witnessed seizure activity. MRI pending. ETT 2/18-2/20. PMHx significant for HTN, HLD, T2DM, B12 injections and glioblastoma (s/p craniotomy, XRT and chemotherapy, maintained on Temodar). Wife reports pain in chest after swallowing that they attributed to reflux. Clinical Impression: Pt has an oropharyngeal dysphagia with oral phase marked by slow mastication, anterior spillage of thin liquids primarily, and diffuse oral residue that he does reduce spontaneously with subsequent swallows. Pharyngeally he has reduced hyolaryngeal movement, diminished pharyngeal stripping wave, and reduced base of tongue retraction. He has almost no epiglottic inversion with thinner consistencies. He has residue throughout his pharynx with all consistencies, although it does increase as boluses become thicker. Penetration to the vocal folds and even trace amounts of aspiration (PAS 8) occur with thin liquids. Penetration is generally more transient with nectar thick liquids via cup sips, and can be cleared with a cued throat clear when it doesn't clear on its own. Penetration reaches the vocal folds when trying to introduce a chin tuck. Although there is no penetration with honey thick liquids, there is also more pharyngeal residue. Recommend starting with a full liquid diet thickened to nectar thick liquids, consumed via cup. Note that throughout the study, pt was also quite impulsive at times, and would benefit from full supervision to slow his pacing and adhere to strategies listed below. DIGEST Swallow Severity Rating*  Safety: 2  Efficiency: 1  Overall Pharyngeal Swallow Severity: 2 1: mild; 2: moderate; 3: severe; 4: profound *The Dynamic Imaging Grade of Swallowing Toxicity is standardized for the  head and neck cancer population, however, demonstrates promising clinical applications across populations to standardize the clinical rating of pharyngeal swallow safety and severity. Factors that may increase risk of adverse event in presence of aspiration Rubye Oaks & Clearance Coots 2021): Factors that may increase risk of adverse event in presence of aspiration Rubye Oaks & Clearance Coots 2021): Limited mobility; Frail or deconditioned; Weak cough; Poor general health and/or compromised immunity; Respiratory or GI disease Recommendations/Plan:  Swallowing Evaluation Recommendations Swallowing Evaluation Recommendations Recommendations: PO diet PO Diet Recommendation: Full liquid diet; Mildly thick liquids (Level 2, nectar thick) Liquid Administration via: Cup; No straw Medication Administration: Crushed with puree Supervision: Staff to assist with self-feeding; Full assist for feeding Swallowing strategies  : Minimize environmental distractions; Slow rate; Small bites/sips; Multiple dry swallows after each bite/sip; Clear throat intermittently Postural changes: Position pt fully upright for meals; Stay upright 30-60 min after meals Oral care recommendations: Oral care BID (2x/day) Caregiver Recommendations: Avoid jello, ice cream, thin soups, popsicles; Remove water pitcher; Have oral suction available Treatment Plan Treatment Plan Treatment recommendations: Therapy as outlined in treatment plan below Follow-up recommendations: Skilled nursing-short term rehab (<3 hours/day) Functional status assessment: Patient has had a recent decline in their functional status and demonstrates the ability to make significant improvements in function in a reasonable and predictable amount of time. Treatment frequency: Min 2x/week Treatment duration: 2 weeks Interventions: Aspiration precaution training; Compensatory techniques; Patient/family education; Oropharyngeal exercises; Trials of upgraded texture/liquids; Diet toleration management by SLP  Recommendations Recommendations for follow up therapy are one component of a multi-disciplinary discharge planning process, led by the attending physician.  Recommendations may be updated based on patient status, additional functional criteria and insurance authorization. Assessment: Orofacial Exam: Orofacial Exam Oral Cavity: Oral Hygiene: WFL Oral Cavity - Dentition: Adequate natural dentition; Missing dentition Orofacial Anatomy: WFL Anatomy: Anatomy: WFL Boluses Administered: Boluses Administered Boluses Administered: Thin liquids (Level 0); Mildly thick liquids (Level 2, nectar thick); Moderately thick liquids (Level 3, honey thick); Puree; Solid  Oral Impairment Domain: Oral Impairment Domain Lip Closure: Escape beyond mid-chin Tongue control during bolus hold: Posterior escape of greater than half of bolus Bolus preparation/mastication: Slow prolonged chewing/mashing with complete recollection Bolus transport/lingual motion: Brisk tongue motion Oral residue: Majority of bolus remaining Location of oral residue : Tongue; Floor of mouth Initiation of pharyngeal swallow : Valleculae  Pharyngeal Impairment Domain: Pharyngeal Impairment Domain Soft palate elevation: No bolus between soft palate (SP)/pharyngeal wall (PW) Laryngeal elevation: Partial superior movement of thyroid cartilage/partial approximation of arytenoids to epiglottic petiole Anterior hyoid excursion: Partial anterior movement Epiglottic movement: No inversion Laryngeal vestibule closure: Incomplete, narrow column air/contrast in laryngeal vestibule Pharyngeal stripping wave : Present - diminished Pharyngeal contraction (A/P view only): N/A Pharyngoesophageal segment opening: Complete distension and complete duration, no obstruction of flow Tongue base retraction: Wide column of contrast or air between tongue base and PPW Pharyngeal residue: Collection of residue within or on pharyngeal structures Location of pharyngeal residue: Valleculae;  Pyriform sinuses; Aryepiglottic folds; Pharyngeal wall  Esophageal Impairment Domain: No data recorded Pill: No data recorded Penetration/Aspiration Scale Score: Penetration/Aspiration Scale Score 1.  Material does not enter airway: Moderately thick liquids (Level 3, honey thick); Solid 2.  Material enters airway, remains ABOVE vocal cords then ejected out: Mildly thick liquids (Level 2, nectar thick); Puree 8.  Material enters airway, passes BELOW cords without attempt by patient to eject out (silent aspiration) : Thin liquids (Level 0) Compensatory Strategies: Compensatory Strategies Compensatory strategies: Yes Chin tuck: Ineffective Ineffective Chin Tuck: Thin liquid (Level 0); Mildly thick liquid (Level 2, nectar thick)   General Information: Caregiver present: No  Diet Prior to this Study: NPO   Temperature : Normal   Respiratory Status: WFL   Supplemental O2: None (Room air)   History of Recent Intubation: Yes  Behavior/Cognition: Alert; Cooperative; Pleasant mood Self-Feeding Abilities: Able to self-feed; Other (Comment) (assist given for impulsivity) Baseline vocal quality/speech: Dysphonic; Hypophonia/low volume Volitional Cough:  Able to elicit Volitional Swallow: Able to elicit Exam Limitations: No limitations Goal Planning: Prognosis for improved oropharyngeal function: Good No data recorded No data recorded Patient/Family Stated Goal: wants a strawberry milkshake Consulted and agree with results and recommendations: Patient; Nurse; Dietitian Pain: Pain Assessment Pain Assessment: Faces Faces Pain Scale: 2 Facial Expression: 0 Body Movements: 0 Muscle Tension: 0 Compliance with ventilator (intubated pts.): N/A Vocalization (extubated pts.): 0 CPOT Total: 0 Pain Location: IV site, R arm Pain Descriptors / Indicators: Sore Pain Intervention(s): Monitored during session End of Session: Start Time:SLP Start Time (ACUTE ONLY): 1502 Stop Time: SLP Stop Time (ACUTE ONLY): 1517 Time Calculation:SLP Time  Calculation (min) (ACUTE ONLY): 15 min Charges: SLP Evaluations $ SLP Speech Visit: 1 Visit SLP Evaluations $BSS Swallow: 1 Procedure $MBS Swallow: 1 Procedure SLP visit diagnosis: SLP Visit Diagnosis: Dysphagia, oropharyngeal phase (R13.12) Past Medical History: Past Medical History: Diagnosis Date  B12 deficiency   monthly shots  Diabetes mellitus   Glaucoma suspect   Glioblastoma (HCC)   Hyperlipemia   Hypertension  Past Surgical History: Past Surgical History: Procedure Laterality Date  APPLICATION OF CRANIAL NAVIGATION Right 05/12/2023  Procedure: APPLICATION OF CRANIAL NAVIGATION;  Surgeon: Dawley, Alan Mulder, DO;  Location: [facility] OR;  Service: Neurosurgery;  Laterality: Right;  CRANIOTOMY Right 05/12/2023  Procedure: RIGHT STERIOSTACTIC FRONTAL CRANIOTOMY FOR TUMOR RESECTION;  Surgeon: Bethann Goo, DO;  Location: [facility] OR;  Service: Neurosurgery;  Laterality: Right; Mahala Menghini., M.A. CCC-SLP Acute Rehabilitation Services Office [phone] Secure chat preferred 10/30/2023, 4:06 PM  Overnight EEG with video Result Date: 10/28/2023 Charlsie Quest, MD     10/29/2023  9:48 AM Patient Name: WOOD NOVACEK MRN: [id number] Epilepsy Attending: Charlsie Quest Referring Physician/Provider: Rejeana Brock, MD Duration: 10/27/2023 2303 to 10/28/2023 2303 Patient history:  78 y.o. male with new onset status epilepticus. EEG to evaluate for seizure Level of alertness:  comatose AEDs during EEG study: LEV, PHT, Propofol Technical aspects: This EEG study was done with scalp electrodes positioned according to the 10-20 International system of electrode placement. Electrical activity was reviewed with band pass filter of 1-70Hz , sensitivity of 7 uV/mm, display speed of 53mm/sec with a 60Hz  notched filter applied as appropriate. EEG data were recorded continuously and digitally stored.  Video monitoring was available and reviewed as appropriate. Description: EEG showed continuous generalized and lateralized right hemisphere  3 to 6 Hz theta-delta slowing admixed with 15 to 18 Hz beta activity distributed symmetrically and diffusely. Hyperventilation and photic stimulation were not performed.   ABNORMALITY - Continuous slow, generalized and lateralized right hemisphere IMPRESSION: This study is suggestive of cortical dysfunction arising from right hemisphere likely secondary to underlying structural abnormality. Additionally there is severe diffuse encephalopathy likely related to sedation. No seizures or epileptiform discharges were seen throughout the recording. Charlsie Quest   DG Chest Port 1 View Result Date: 10/28/2023 CLINICAL DATA:  78 year old male is intubated.  Seizure. EXAM: PORTABLE CHEST 1 VIEW COMPARISON:  Portable chest yesterday and earlier. FINDINGS: Portable AP semi upright view at 0536 hours. Endotracheal tube tip in good position between the level the clavicles and carina. Enteric tube courses to the left upper quadrant as before. Stable low lung volumes. Normal cardiac size and mediastinal contours. Allowing for portable technique the lungs are clear. Paucity of bowel gas. Stable visualized osseous structures. IMPRESSION: 1.  Stable lines and tubes. 2. Low lung volumes, no other acute cardiopulmonary abnormality. Electronically Signed   By: Odessa Fleming  M.D.   On: 10/28/2023 06:03   CT Head Wo Contrast Result Date: 10/27/2023 CLINICAL DATA:  Seizure disorder EXAM: CT HEAD WITHOUT CONTRAST TECHNIQUE: Contiguous axial images were obtained from the base of the skull through the vertex without intravenous contrast. RADIATION DOSE REDUCTION: This exam was performed according to the departmental dose-optimization program which includes automated exposure control, adjustment of the mA and/or kV according to patient size and/or use of iterative reconstruction technique. COMPARISON:  07/11/2023 FINDINGS: Brain: Decreased size of right anterior extra-axial collection subjacent to craniotomy site. Postoperative changes of  the right frontal lobe with areas of encephalomalacia and gliosis. Periventricular hypoattenuation consistent with chronic microvascular disease. No acute intracranial hemorrhage. Vascular: Atherosclerotic calcification of the internal carotid and vertebral arteries at the skull base. Skull: Remote right-sided craniotomy. Sinuses/Orbits: No acute finding. Other: None. IMPRESSION: 1. Decreased size of right anterior extra-axial collection subjacent to craniotomy site. 2. Postoperative changes of the right frontal lobe with areas of encephalomalacia and gliosis. Electronically Signed   By: Deatra Robinson M.D.   On: 10/27/2023 23:41   DG Abdomen 1 View Result Date: 10/27/2023 CLINICAL DATA:  OG tube placement EXAM: ABDOMEN - 1 VIEW COMPARISON:  CT 05/06/2023 FINDINGS: Enteric tube tip and side port overlie the proximal stomach. Upper gas pattern is unremarkable. 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 IMPRESSION: Enteric tube tip and side port overlie the proximal stomach. Electronically Signed   By: Jasmine Pang M.D.   On: 10/27/2023 21:44   DG Chest Portable 1 View Result Date: 10/27/2023 CLINICAL DATA:  Intubated EXAM: PORTABLE CHEST 1 VIEW COMPARISON:  07/11/2023 FINDINGS: Endotracheal tube tip is about 4.2 cm superior to carina. Enteric tube tip below the diaphragm but incompletely assessed. Low lung volumes with mild basilar atelectasis. Normal cardiac size. No pneumothorax IMPRESSION: Endotracheal tube tip about 4.2 cm superior to carina. Low lung volumes with mild basilar atelectasis. Electronically Signed   By: Jasmine Pang M.D.   On: 10/27/2023 21:43    Assessment/Plan Glioblastoma, IDH-wildtype (HCC)  Seizure (HCC)  Doris Cheadle Broerman is clinically progressive today, with global decline in function now having completed 2 cycles of 5-day Temozolomide.  Fortunately he has been seizure-free since his admission for breakthrough seizures in February.     For agitation and behavior issues, recommending transitioning from Keppra due to likely side effects.  Will load with Depakote 1500mg  BID x1 day, then follow with 1000mg  BID therafter.  Once load is complete, will stop Keppra. Will then bring him back to clinic in 1 week to check depakote level.    Decadron should also be decreased to 4mg  daily, as this can also be contributing to behavior issues and irritability.  We recommended deferring chemotherapy for the time being.  If not improved with the above modifications, will renew goals of care discussions.    All questions were answered. The patient knows to call the clinic with any problems, questions or concerns. No barriers to learning were detected.  We ask that JOAQUIM TOLEN return to clinic in 1 week with labs prior to (potential) cycle #3 or goals of care discussion, or sooner as needed.  The total time spent in the encounter was 40 minutes and more than 50% was on counseling and review of test results   Henreitta Leber, MD Medical Director of Neuro-Oncology [facility] at [facility] 11/16/23 9:13 AM

## 2023-11-17 ENCOUNTER — Encounter: Payer: Self-pay | Admitting: Internal Medicine

## 2023-11-17 ENCOUNTER — Other Ambulatory Visit (HOSPITAL_COMMUNITY): Payer: Self-pay

## 2023-11-18 ENCOUNTER — Ambulatory Visit: Payer: Medicare Other | Admitting: Internal Medicine

## 2023-11-23 ENCOUNTER — Encounter: Payer: Self-pay | Admitting: Internal Medicine

## 2023-11-23 ENCOUNTER — Inpatient Hospital Stay (HOSPITAL_BASED_OUTPATIENT_CLINIC_OR_DEPARTMENT_OTHER): Admitting: Internal Medicine

## 2023-11-23 ENCOUNTER — Inpatient Hospital Stay

## 2023-11-23 ENCOUNTER — Telehealth: Payer: Self-pay | Admitting: *Deleted

## 2023-11-23 ENCOUNTER — Inpatient Hospital Stay: Admitting: Internal Medicine

## 2023-11-23 VITALS — BP 90/53 | HR 90 | Temp 97.3°F | Resp 19 | Ht 72.0 in | Wt 170.0 lb

## 2023-11-23 DIAGNOSIS — C719 Malignant neoplasm of brain, unspecified: Secondary | ICD-10-CM | POA: Diagnosis not present

## 2023-11-23 DIAGNOSIS — R569 Unspecified convulsions: Secondary | ICD-10-CM | POA: Diagnosis not present

## 2023-11-23 DIAGNOSIS — Z79899 Other long term (current) drug therapy: Secondary | ICD-10-CM | POA: Diagnosis not present

## 2023-11-23 DIAGNOSIS — C711 Malignant neoplasm of frontal lobe: Secondary | ICD-10-CM | POA: Diagnosis not present

## 2023-11-23 LAB — CBC WITH DIFFERENTIAL (CANCER CENTER ONLY)
Abs Immature Granulocytes: 0.49 10*3/uL — ABNORMAL HIGH (ref 0.00–0.07)
Basophils Absolute: 0.1 10*3/uL (ref 0.0–0.1)
Basophils Relative: 1 %
Eosinophils Absolute: 0 10*3/uL (ref 0.0–0.5)
Eosinophils Relative: 0 %
HCT: 36.7 % — ABNORMAL LOW (ref 39.0–52.0)
Hemoglobin: 11.7 g/dL — ABNORMAL LOW (ref 13.0–17.0)
Immature Granulocytes: 4 %
Lymphocytes Relative: 6 %
Lymphs Abs: 0.7 10*3/uL (ref 0.7–4.0)
MCH: 30.7 pg (ref 26.0–34.0)
MCHC: 31.9 g/dL (ref 30.0–36.0)
MCV: 96.3 fL (ref 80.0–100.0)
Monocytes Absolute: 0.6 10*3/uL (ref 0.1–1.0)
Monocytes Relative: 4 %
Neutro Abs: 11.3 10*3/uL — ABNORMAL HIGH (ref 1.7–7.7)
Neutrophils Relative %: 85 %
Platelet Count: 162 10*3/uL (ref 150–400)
RBC: 3.81 MIL/uL — ABNORMAL LOW (ref 4.22–5.81)
RDW: 17.5 % — ABNORMAL HIGH (ref 11.5–15.5)
WBC Count: 13.2 10*3/uL — ABNORMAL HIGH (ref 4.0–10.5)
nRBC: 0 % (ref 0.0–0.2)

## 2023-11-23 LAB — CMP (CANCER CENTER ONLY)
ALT: 13 U/L (ref 0–44)
AST: 11 U/L — ABNORMAL LOW (ref 15–41)
Albumin: 3.4 g/dL — ABNORMAL LOW (ref 3.5–5.0)
Alkaline Phosphatase: 68 U/L (ref 38–126)
Anion gap: 6 (ref 5–15)
BUN: 70 mg/dL — ABNORMAL HIGH (ref 8–23)
CO2: 28 mmol/L (ref 22–32)
Calcium: 8.8 mg/dL — ABNORMAL LOW (ref 8.9–10.3)
Chloride: 108 mmol/L (ref 98–111)
Creatinine: 1.01 mg/dL (ref 0.61–1.24)
GFR, Estimated: >60 mL/min (ref >60)
Glucose, Bld: 280 mg/dL — ABNORMAL HIGH (ref 70–99)
Potassium: 4.3 mmol/L (ref 3.5–5.1)
Sodium: 142 mmol/L (ref 135–145)
Total Bilirubin: 0.4 mg/dL (ref 0.0–1.2)
Total Protein: 6 g/dL — ABNORMAL LOW (ref 6.5–8.1)

## 2023-11-23 LAB — VALPROIC ACID LEVEL: Valproic Acid Lvl: 19 ug/mL — ABNORMAL LOW (ref 50.0–100.0)

## 2023-11-23 NOTE — Progress Notes (Signed)
 [facility] [address]  [place], [place] [address] [phone]   Interval Evaluation  Date of Service: 11/23/23 Patient Name: Alex Church Patient MRN: [id number] Patient DOB: August 10, 1946 Provider: Henreitta Leber, MD  Identifying Statement:  Alex Church is a 78 y.o. male with right frontal glioblastoma   Oncologic History: Oncology History  Glioblastoma, IDH-wildtype (HCC)  05/12/2023 Surgery   Right frontal craniotomy, resection with Dr. Jake Samples; path is glioblastoma IDH-wt   06/09/2023 -  Chemotherapy   Patient is on Treatment Plan : BRAIN GLIOBLASTOMA Radiation Therapy With Concurrent Temozolomide 75 mg/m2 Daily Followed By Sequential Maintenance Temozolomide x 6-12 cycles          Interval History: Alex Church presents today for follow.  Overall condition has declined; he is extremely irritable with behavioral issues persisting despite the Keppra stopping.  He is sleeping more, sometimes the entire day.  Having difficulty with eating and swallowing at times.  He otherwise describes no improvement left leg weakness.  He remains at the rehab facility, plan for discharge to home on Wednesday.  H+P (06/02/23) Patient presented to neurologic attention in late August with several days history of left sided weakness, confusion.  Wife noticed he had difficulty getting out of bed, was dragging left side.  This was on top of several months history of progressive confusion, short term memory impairment.  CNS imaging demonstrated a large right frontal mass.  He underwent craniotomy and resection with Dr. Jake Samples, path demonstrated glioblastoma.  Since surgery he has been doing well, walking independently.  Denies seizures, headaches.  He is off decadron.   Medications: Current Outpatient Medications on File Prior to Visit  Medication Sig Dispense Refill   acetaminophen (TYLENOL) 500 MG tablet Take 1,000 mg by mouth every 6 (six) hours as  needed for mild pain (pain score 1-3) (Tries to only take twice a week).     allopurinol (ZYLOPRIM) 100 MG tablet Take 1 tablet (100 mg total) by mouth 2 (two) times daily. 180 tablet 1   atorvastatin (LIPITOR) 10 MG tablet Take 1 tablet (10 mg total) by mouth at bedtime. 90 tablet 1   carvedilol (COREG) 12.5 MG tablet Take 0.5 tablets (6.25 mg total) by mouth 2 (two) times daily with a meal. 90 tablet 1   cloNIDine (CATAPRES) 0.1 MG tablet Take 1 tablet (0.1 mg total) by mouth 2 (two) times daily. 180 tablet 1   dexamethasone (DECADRON) 4 MG tablet Take 1 tablet (4 mg total) by mouth daily.     divalproex (DEPAKOTE ER) 500 MG 24 hr tablet Take 3 tablets (1,500 mg total) by mouth daily for 1 day, THEN 2 tablets (1,000 mg total) in the morning and at bedtime. 123 tablet 0   glucose blood test strip Check blood sugars once daily 100 each 12   Lancets (ONETOUCH ULTRASOFT) lancets Check bloods sugars once daily 100 each 12   lisinopril (ZESTRIL) 20 MG tablet Take 20 mg by mouth at bedtime.     melatonin 5 MG TABS Take 1 tablet (5 mg total) by mouth at bedtime. (Patient taking differently: Take 5 mg by mouth at bedtime as needed (sleep).) 30 tablet 0   metFORMIN (GLUCOPHAGE) 1000 MG tablet Take 1 tablet (1,000 mg total) by mouth 2 (two) times daily with a meal. (Patient taking differently: Take 500 mg by mouth 2 (two) times daily with a meal.) 180 tablet 1   ondansetron (ZOFRAN) 8 MG tablet  Take 1 tablet (8 mg total) by mouth every 8 (eight) hours as needed for nausea or vomiting. May take 30-60 minutes prior to Temodar administration if nausea/vomiting occurs as needed. 30 tablet 1   pantoprazole (PROTONIX) 40 MG tablet Take 1 tablet (40 mg total) by mouth daily. 30 tablet 1   pioglitazone (ACTOS) 45 MG tablet Take 1 tablet (45 mg total) by mouth daily. 90 tablet 1   QUEtiapine (SEROQUEL) 50 MG tablet Take 1 tablet (50 mg total) by mouth at bedtime. 30 tablet 0   senna (SENOKOT) 8.6 MG TABS tablet Take 1  tablet by mouth daily as needed for mild constipation or moderate constipation.     sitaGLIPtin (JANUVIA) 100 MG tablet Take 1 tablet (100 mg total) by mouth daily. 90 tablet 1   tamsulosin (FLOMAX) 0.4 MG CAPS capsule Take 1 capsule (0.4 mg total) by mouth daily after supper. 90 capsule 1   temozolomide (TEMODAR) 100 MG capsule Take 3 capsules (300 mg total) by mouth at bedtime. Take for 5 days on, then off for 23 days. Repeat every 28 days. Take on an empty stomach to decrease nausea & vomiting. (Patient not taking: Reported on 11/23/2023) 15 capsule 0   No current facility-administered medications on file prior to visit.    Allergies: No Known Allergies Past Medical History:  Past Medical History:  Diagnosis Date   B12 deficiency    monthly shots   Diabetes mellitus    Glaucoma suspect    Glioblastoma (HCC)    Hyperlipemia    Hypertension    Past Surgical History:  Past Surgical History:  Procedure Laterality Date   APPLICATION OF CRANIAL NAVIGATION Right 05/12/2023   Procedure: APPLICATION OF CRANIAL NAVIGATION;  Surgeon: Dawley, Alan Mulder, DO;  Location: [facility] OR;  Service: Neurosurgery;  Laterality: Right;   CRANIOTOMY Right 05/12/2023   Procedure: RIGHT STERIOSTACTIC FRONTAL CRANIOTOMY FOR TUMOR RESECTION;  Surgeon: Bethann Goo, DO;  Location: [facility] OR;  Service: Neurosurgery;  Laterality: Right;   Social History:  Social History   Socioeconomic History   Marital status: Married    Spouse name: Not on file   Number of children: 0   Years of education: Not on file   Highest education level: GED or equivalent  Occupational History   Occupation: retired from [employer], works for a lab driving  Tobacco Use   Smoking status: Never   Smokeless tobacco: Never  Substance and Sexual Activity   Alcohol use: No   Drug use: No   Sexual activity: Not on file  Other Topics Concern   Not on file  Social History Narrative   Married (2nd marriage)   Lives w/ wife     Has a rescue dog    Social Drivers of Health   Financial Resource Strain: Low Risk  (07/13/2023)   Overall Financial Resource Strain (CARDIA)    Difficulty of Paying Living Expenses: Not hard at all  Food Insecurity: No Food Insecurity (10/31/2023)   Hunger Vital Sign    Worried About Running Out of Food in the Last Year: Never true    Ran Out of Food in the Last Year: Never true  Transportation Needs: No Transportation Needs (10/31/2023)   PRAPARE - Administrator, Civil Service (Medical): No    Lack of Transportation (Non-Medical): No  Physical Activity: Insufficiently Active (07/13/2023)   Exercise Vital Sign    Days of Exercise per Week: 2 days    Minutes of Exercise per  Session: 20 min  Stress: Stress Concern Present (07/13/2023)   [employer] of Occupational Health - Occupational Stress Questionnaire    Feeling of Stress : To some extent  Social Connections: Moderately Isolated (10/31/2023)   Social Connection and Isolation Panel [NHANES]    Frequency of Communication with Friends and Family: Twice a week    Frequency of Social Gatherings with Friends and Family: More than three times a week    Attends Religious Services: Never    Database administrator or Organizations: No    Attends Banker Meetings: Never    Marital Status: Married  Catering manager Violence: Not At Risk (10/31/2023)   Humiliation, Afraid, Rape, and Kick questionnaire    Fear of Current or Ex-Partner: No    Emotionally Abused: No    Physically Abused: No    Sexually Abused: No   Family History:  Family History  Problem Relation Age of Onset   Diabetes Maternal Uncle    Heart attack Brother 31   Cancer Mother        intra-abdominal   Cirrhosis Cousin        maternal   Diabetes Cousin        maternal   Stroke Neg Hx    Colon cancer Neg Hx    Prostate cancer Neg Hx     Review of Systems: Constitutional: Doesn't report fevers, chills or abnormal weight loss Eyes: Doesn't report  blurriness of vision Ears, nose, mouth, throat, and face: Doesn't report sore throat Respiratory: Doesn't report cough, dyspnea or wheezes Cardiovascular: Doesn't report palpitation, chest discomfort  Gastrointestinal:  Doesn't report nausea, constipation, diarrhea GU: Doesn't report incontinence Skin: Doesn't report skin rashes Neurological: Per HPI Musculoskeletal: Doesn't report joint pain Behavioral/Psych: Doesn't report anxiety  Physical Exam: Vitals:   11/23/23 1029  BP: (!) 90/53  Pulse: 90  Resp: 19  Temp: (!) 97.3 F (36.3 C)  SpO2: 96%   KPS: 60. General: Wheelchair bound Head: Normal EENT: No conjunctival injection or scleral icterus.  Lungs: Resp effort normal Cardiac: Regular rate Abdomen: Non-distended abdomen Skin: No rashes cyanosis or petechiae. Extremities: No clubbing or edema  Neurologic Exam: Mental Status: Awake, alert, attentive to examiner, but agitated and hyperkinetic. Oriented to self and environment. Language is fluent with intact comprehension. Disorganzed thinking with age advanced psychomotor slowing.   Cranial Nerves: Visual acuity is grossly normal. Visual fields are full. Extra-ocular movements intact. No ptosis. Face is symmetric Motor: Tone and bulk are normal. Power is 4+/5 in left leg. Reflexes are symmetric, no pathologic reflexes present.  Sensory: Intact to light touch Gait: Non ambulatory   Labs: I have reviewed the data as listed    Component Value Date/Time   NA 142 11/23/2023 0952   NA 139 04/06/2022 1047   K 4.3 11/23/2023 0952   CL 108 11/23/2023 0952   CO2 28 11/23/2023 0952   GLUCOSE 280 (H) 11/23/2023 0952   GLUCOSE 147 (H) 08/26/2006 1051   BUN 70 (H) 11/23/2023 0952   BUN 17 04/06/2022 1047   CREATININE 1.01 11/23/2023 0952   CREATININE 1.09 06/20/2020 1509   CALCIUM 8.8 (L) 11/23/2023 0952   PROT 6.0 (L) 11/23/2023 0952   ALBUMIN 3.4 (L) 11/23/2023 0952   AST 11 (L) 11/23/2023 0952   ALT 13 11/23/2023 0952    ALKPHOS 68 11/23/2023 0952   BILITOT 0.4 11/23/2023 0952   GFRNONAA >60 11/23/2023 0952   GFRAA 126 08/26/2007 0943   Lab Results  Component Value Date   WBC 13.2 (H) 11/23/2023   NEUTROABS 11.3 (H) 11/23/2023   HGB 11.7 (L) 11/23/2023   HCT 36.7 (L) 11/23/2023   MCV 96.3 11/23/2023   PLT 162 11/23/2023    Imaging:  [facility] Clinician Interpretation: I have personally reviewed the CNS images as listed.  My interpretation, in the context of the patient's clinical presentation, is likely treatment effect  CT Angio Chest Pulmonary Embolism (PE) W or WO Contrast Result Date: 11/04/2023 CLINICAL DATA:  Pulmonary embolism (PE) suspected, high prob EXAM: CT ANGIOGRAPHY CHEST WITH CONTRAST TECHNIQUE: Multidetector CT imaging of the chest was performed using the standard protocol during bolus administration of intravenous contrast. Multiplanar CT image reconstructions and MIPs were obtained to evaluate the vascular anatomy. RADIATION DOSE REDUCTION: This exam was performed according to the departmental dose-optimization program which includes automated exposure control, adjustment of the mA and/or kV according to patient size and/or use of iterative reconstruction technique. CONTRAST:  75mL OMNIPAQUE IOHEXOL 350 MG/ML SOLN COMPARISON:  07/11/2023, 10/28/2023 FINDINGS: Cardiovascular: This is a technically adequate evaluation of the pulmonary vasculature. No filling defects or pulmonary emboli. The heart is unremarkable without pericardial effusion. No evidence of thoracic aortic aneurysm or dissection. Atherosclerosis of the aorta and coronary vasculature. Mediastinum/Nodes: No enlarged mediastinal, hilar, or axillary lymph nodes. Thyroid gland, trachea, and esophagus demonstrate no significant findings. Lungs/Pleura: Dependent lower lobe atelectasis, left greater than right. Trace left pleural effusion. No airspace disease or pneumothorax. Central airways are patent. Upper Abdomen: Stable calcifications  within the gallbladder. Stable cyst or hemangioma within the spleen. Musculoskeletal: No acute or destructive bony abnormalities. Reconstructed images demonstrate no additional findings. Review of the MIP images confirms the above findings. IMPRESSION: 1. No evidence of pulmonary embolus. 2. Trace left pleural effusion, with dependent lower lobe atelectasis left greater than right. 3. Stable gallbladder calcifications consistent with cholelithiasis or porcelain gallbladder. 4. Aortic Atherosclerosis (ICD10-I70.0). Coronary artery atherosclerosis. Electronically Signed   By: Sharlet Salina M.D.   On: 11/04/2023 18:54   CT HEAD WO CONTRAST ( ) Result Date: 10/31/2023 CLINICAL DATA:  Brain/CNS neoplasm, staging EXAM: CT HEAD WITHOUT CONTRAST TECHNIQUE: Contiguous axial images were obtained from the base of the skull through the vertex without intravenous contrast. RADIATION DOSE REDUCTION: This exam was performed according to the departmental dose-optimization program which includes automated exposure control, adjustment of the mA and/or kV according to patient size and/or use of iterative reconstruction technique. COMPARISON:  Head CT 10/27/2023, brain MR 10/30/2023 FINDINGS: Brain: No hemorrhage. No hydrocephalus. No extra-axial fluid collection. No mastoid. No mass is. There is dural thickening along the craniotomy site. There is encephalomalacia in the right frontal lobe adjacent to the craniotomy site. There is a background of moderate chronic microvascular ischemic change. Generalized volume loss. No hemorrhage. No hydrocephalus. No CT evidence of an acute cortical infarct. 7 mm rightward midline shift, unchanged from prior exam. Vascular: No hyperdense vessel or unexpected calcification. Skull: Postsurgical changes from a right frontal craniotomy. Sinuses/Orbits: No middle ear or mastoid effusion. Paranasal sinuses are clear. Orbits are unremarkable. Other: None. IMPRESSION: 1.   No acute intracranial  abnormality 2. Postsurgical changes from a right frontal craniotomy with unchanged CT appearance of the subjacent resection cavity Electronically Signed   By: Lorenza Cambridge M.D.   On: 10/31/2023 13:01   MR BRAIN W WO CONTRAST Result Date: 10/30/2023 CLINICAL DATA:  Brain/CNS neoplasm staging EXAM: MRI HEAD WITHOUT AND WITH CONTRAST TECHNIQUE: Multiplanar, multiecho pulse sequences of the brain and surrounding  structures were obtained without and with intravenous contrast. CONTRAST:  7.41mL GADAVIST GADOBUTROL 1 MMOL/ML IV SOLN COMPARISON:  10/07/2023 FINDINGS: The examination is severely motion degraded. Brain: Unchanged appearance of small right anterior extra-axial collection. No acute or chronic hemorrhage. There is multifocal hyperintense T2-weighted signal within the white matter. Generalized volume loss. There is a right frontal resection cavity with a small area of contrast enhancement along its medial border measuring 11 x 5 mm. Peripheral enhancement of the cavity is unchanged. The degree of surrounding hyperintense T2-weighted signal is also unchanged. The midline structures are normal. Vascular: Normal flow voids. Skull and upper cervical spine: Normal calvarium and skull base. Visualized upper cervical spine and soft tissues are normal. Sinuses/Orbits:No paranasal sinus fluid levels or advanced mucosal thickening. No mastoid or middle ear effusion. Normal orbits. IMPRESSION: 1. Severely motion degraded examination. 2. Right frontal resection cavity with a small area of contrast enhancement along its medial border measuring 11 x 5 mm. This could represent recurrent disease, but the degree of motion limits assessment. Electronically Signed   By: Deatra Robinson M.D.   On: 10/30/2023 19:06   DG Swallowing Func-Speech Pathology Result Date: 10/30/2023 Table formatting from the original result was not included. Modified Barium Swallow Study Patient Details Name: Alex Church MRN: [id number] Date of Birth:  1946-05-03 Today's Date: 10/30/2023 HPI/PMH: HPI: 78 year old man who presented to [facility] 2/18 with witnessed seizure activity. MRI pending. ETT 2/18-2/20. PMHx significant for HTN, HLD, T2DM, B12 injections and glioblastoma (s/p craniotomy, XRT and chemotherapy, maintained on Temodar). Wife reports pain in chest after swallowing that they attributed to reflux. Clinical Impression: Pt has an oropharyngeal dysphagia with oral phase marked by slow mastication, anterior spillage of thin liquids primarily, and diffuse oral residue that he does reduce spontaneously with subsequent swallows. Pharyngeally he has reduced hyolaryngeal movement, diminished pharyngeal stripping wave, and reduced base of tongue retraction. He has almost no epiglottic inversion with thinner consistencies. He has residue throughout his pharynx with all consistencies, although it does increase as boluses become thicker. Penetration to the vocal folds and even trace amounts of aspiration (PAS 8) occur with thin liquids. Penetration is generally more transient with nectar thick liquids via cup sips, and can be cleared with a cued throat clear when it doesn't clear on its own. Penetration reaches the vocal folds when trying to introduce a chin tuck. Although there is no penetration with honey thick liquids, there is also more pharyngeal residue. Recommend starting with a full liquid diet thickened to nectar thick liquids, consumed via cup. Note that throughout the study, pt was also quite impulsive at times, and would benefit from full supervision to slow his pacing and adhere to strategies listed below. DIGEST Swallow Severity Rating*  Safety: 2  Efficiency: 1  Overall Pharyngeal Swallow Severity: 2 1: mild; 2: moderate; 3: severe; 4: profound *The Dynamic Imaging Grade of Swallowing Toxicity is standardized for the head and neck cancer population, however, demonstrates promising clinical applications across populations to standardize the clinical rating  of pharyngeal swallow safety and severity. Factors that may increase risk of adverse event in presence of aspiration Alex Church 2021): Factors that may increase risk of adverse event in presence of aspiration Alex Church 2021): Limited mobility; Frail or deconditioned; Weak cough; Poor general health and/or compromised immunity; Respiratory or GI disease Recommendations/Plan: Swallowing Evaluation Recommendations Swallowing Evaluation Recommendations Recommendations: PO diet PO Diet Recommendation: Full liquid diet; Mildly thick liquids (Level 2, nectar thick) Liquid Administration via:  Cup; No straw Medication Administration: Crushed with puree Supervision: Staff to assist with self-feeding; Full assist for feeding Swallowing strategies  : Minimize environmental distractions; Slow rate; Small bites/sips; Multiple dry swallows after each bite/sip; Clear throat intermittently Postural changes: Position pt fully upright for meals; Stay upright 30-60 min after meals Oral care recommendations: Oral care BID (2x/day) Caregiver Recommendations: Avoid jello, ice cream, thin soups, popsicles; Remove water pitcher; Have oral suction available Treatment Plan Treatment Plan Treatment recommendations: Therapy as outlined in treatment plan below Follow-up recommendations: Skilled nursing-short term rehab (<3 hours/day) Functional status assessment: Patient has had a recent decline in their functional status and demonstrates the ability to make significant improvements in function in a reasonable and predictable amount of time. Treatment frequency: Min 2x/week Treatment duration: 2 weeks Interventions: Aspiration precaution training; Compensatory techniques; Patient/family education; Oropharyngeal exercises; Trials of upgraded texture/liquids; Diet toleration management by SLP Recommendations Recommendations for follow up therapy are one component of a multi-disciplinary discharge planning process, led by the  attending physician.  Recommendations may be updated based on patient status, additional functional criteria and insurance authorization. Assessment: Orofacial Exam: Orofacial Exam Oral Cavity: Oral Hygiene: WFL Oral Cavity - Dentition: Adequate natural dentition; Missing dentition Orofacial Anatomy: WFL Anatomy: Anatomy: WFL Boluses Administered: Boluses Administered Boluses Administered: Thin liquids (Level 0); Mildly thick liquids (Level 2, nectar thick); Moderately thick liquids (Level 3, honey thick); Puree; Solid  Oral Impairment Domain: Oral Impairment Domain Lip Closure: Escape beyond mid-chin Tongue control during bolus hold: Posterior escape of greater than half of bolus Bolus preparation/mastication: Slow prolonged chewing/mashing with complete recollection Bolus transport/lingual motion: Brisk tongue motion Oral residue: Majority of bolus remaining Location of oral residue : Tongue; Floor of mouth Initiation of pharyngeal swallow : Valleculae  Pharyngeal Impairment Domain: Pharyngeal Impairment Domain Soft palate elevation: No bolus between soft palate (SP)/pharyngeal wall (PW) Laryngeal elevation: Partial superior movement of thyroid cartilage/partial approximation of arytenoids to epiglottic petiole Anterior hyoid excursion: Partial anterior movement Epiglottic movement: No inversion Laryngeal vestibule closure: Incomplete, narrow column air/contrast in laryngeal vestibule Pharyngeal stripping wave : Present - diminished Pharyngeal contraction (A/P view only): N/A Pharyngoesophageal segment opening: Complete distension and complete duration, no obstruction of flow Tongue base retraction: Wide column of contrast or air between tongue base and PPW Pharyngeal residue: Collection of residue within or on pharyngeal structures Location of pharyngeal residue: Valleculae; Pyriform sinuses; Aryepiglottic folds; Pharyngeal wall  Esophageal Impairment Domain: No data recorded Pill: No data recorded  Penetration/Aspiration Scale Score: Penetration/Aspiration Scale Score 1.  Material does not enter airway: Moderately thick liquids (Level 3, honey thick); Solid 2.  Material enters airway, remains ABOVE vocal cords then ejected out: Mildly thick liquids (Level 2, nectar thick); Puree 8.  Material enters airway, passes BELOW cords without attempt by patient to eject out (silent aspiration) : Thin liquids (Level 0) Compensatory Strategies: Compensatory Strategies Compensatory strategies: Yes Chin tuck: Ineffective Ineffective Chin Tuck: Thin liquid (Level 0); Mildly thick liquid (Level 2, nectar thick)   General Information: Caregiver present: No  Diet Prior to this Study: NPO   Temperature : Normal   Respiratory Status: WFL   Supplemental O2: None (Room air)   History of Recent Intubation: Yes  Behavior/Cognition: Alert; Cooperative; Pleasant mood Self-Feeding Abilities: Able to self-feed; Other (Comment) (assist given for impulsivity) Baseline vocal quality/speech: Dysphonic; Hypophonia/low volume Volitional Cough: Able to elicit Volitional Swallow: Able to elicit Exam Limitations: No limitations Goal Planning: Prognosis for improved oropharyngeal function: Good No data recorded No data  recorded Patient/Family Stated Goal: wants a strawberry milkshake Consulted and agree with results and recommendations: Patient; Nurse; Dietitian Pain: Pain Assessment Pain Assessment: Faces Faces Pain Scale: 2 Facial Expression: 0 Body Movements: 0 Muscle Tension: 0 Compliance with ventilator (intubated pts.): N/A Vocalization (extubated pts.): 0 CPOT Total: 0 Pain Location: IV site, R arm Pain Descriptors / Indicators: Sore Pain Intervention(s): Monitored during session End of Session: Start Time:SLP Start Time (ACUTE ONLY): 1502 Stop Time: SLP Stop Time (ACUTE ONLY): 1517 Time Calculation:SLP Time Calculation (min) (ACUTE ONLY): 15 min Charges: SLP Evaluations $ SLP Speech Visit: 1 Visit SLP Evaluations $BSS Swallow: 1 Procedure  $MBS Swallow: 1 Procedure SLP visit diagnosis: SLP Visit Diagnosis: Dysphagia, oropharyngeal phase (R13.12) Past Medical History: Past Medical History: Diagnosis Date  B12 deficiency   monthly shots  Diabetes mellitus   Glaucoma suspect   Glioblastoma (HCC)   Hyperlipemia   Hypertension  Past Surgical History: Past Surgical History: Procedure Laterality Date  APPLICATION OF CRANIAL NAVIGATION Right 05/12/2023  Procedure: APPLICATION OF CRANIAL NAVIGATION;  Surgeon: Dawley, Alan Mulder, DO;  Location: [facility] OR;  Service: Neurosurgery;  Laterality: Right;  CRANIOTOMY Right 05/12/2023  Procedure: RIGHT STERIOSTACTIC FRONTAL CRANIOTOMY FOR TUMOR RESECTION;  Surgeon: Bethann Goo, DO;  Location: [facility] OR;  Service: Neurosurgery;  Laterality: Right; [place] Menghini., M.A. CCC-SLP Acute Rehabilitation Services Office [phone] Secure chat preferred 10/30/2023, 4:06 PM  Overnight EEG with video Result Date: 10/28/2023 Alex Quest, MD     10/29/2023  9:48 AM Patient Name: Alex Church MRN: [id number] Epilepsy Attending: Charlsie Church Referring Physician/Provider: Rejeana Brock, MD Duration: 10/27/2023 2303 to 10/28/2023 2303 Patient history:  78 y.o. male with new onset status epilepticus. EEG to evaluate for seizure Level of alertness:  comatose AEDs during EEG study: LEV, PHT, Propofol Technical aspects: This EEG study was done with scalp electrodes positioned according to the 10-20 International system of electrode placement. Electrical activity was reviewed with band pass filter of 1-70Hz , sensitivity of 7 uV/mm, display speed of 42mm/sec with a 60Hz  notched filter applied as appropriate. EEG data were recorded continuously and digitally stored.  Video monitoring was available and reviewed as appropriate. Description: EEG showed continuous generalized and lateralized right hemisphere 3 to 6 Hz theta-delta slowing admixed with 15 to 18 Hz beta activity distributed symmetrically and diffusely. Hyperventilation and  photic stimulation were not performed.   ABNORMALITY - Continuous slow, generalized and lateralized right hemisphere IMPRESSION: This study is suggestive of cortical dysfunction arising from right hemisphere likely secondary to underlying structural abnormality. Additionally there is severe diffuse encephalopathy likely related to sedation. No seizures or epileptiform discharges were seen throughout the recording. Alex Church   DG Chest Port 1 View Result Date: 10/28/2023 CLINICAL DATA:  78 year old male is intubated.  Seizure. EXAM: PORTABLE CHEST 1 VIEW COMPARISON:  Portable chest yesterday and earlier. FINDINGS: Portable AP semi upright view at 0536 hours. Endotracheal tube tip in good position between the level the clavicles and carina. Enteric tube courses to the left upper quadrant as before. Stable low lung volumes. Normal cardiac size and mediastinal contours. Allowing for portable technique the lungs are clear. Paucity of bowel gas. Stable visualized osseous structures. IMPRESSION: 1.  Stable lines and tubes. 2. Low lung volumes, no other acute cardiopulmonary abnormality. Electronically Signed   By: Odessa Fleming M.D.   On: 10/28/2023 06:03   CT Head Wo Contrast Result Date: 10/27/2023 CLINICAL DATA:  Seizure disorder EXAM: CT HEAD WITHOUT CONTRAST  TECHNIQUE: Contiguous axial images were obtained from the base of the skull through the vertex without intravenous contrast. RADIATION DOSE REDUCTION: This exam was performed according to the departmental dose-optimization program which includes automated exposure control, adjustment of the mA and/or kV according to patient size and/or use of iterative reconstruction technique. COMPARISON:  07/11/2023 FINDINGS: Brain: Decreased size of right anterior extra-axial collection subjacent to craniotomy site. Postoperative changes of the right frontal lobe with areas of encephalomalacia and gliosis. Periventricular hypoattenuation consistent with chronic  microvascular disease. No acute intracranial hemorrhage. Vascular: Atherosclerotic calcification of the internal carotid and vertebral arteries at the skull base. Skull: Remote right-sided craniotomy. Sinuses/Orbits: No acute finding. Other: None. IMPRESSION: 1. Decreased size of right anterior extra-axial collection subjacent to craniotomy site. 2. Postoperative changes of the right frontal lobe with areas of encephalomalacia and gliosis. Electronically Signed   By: Deatra Robinson M.D.   On: 10/27/2023 23:41   DG Abdomen 1 View Result Date: 10/27/2023 CLINICAL DATA:  OG tube placement EXAM: ABDOMEN - 1 VIEW COMPARISON:  CT 05/06/2023 FINDINGS: Enteric tube tip and side port overlie the proximal stomach. Upper gas pattern is unremarkable. 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 IMPRESSION: Enteric tube tip and side port overlie the proximal stomach. Electronically Signed   By: Jasmine Pang M.D.   On: 10/27/2023 21:44   DG Chest Portable 1 View Result Date: 10/27/2023 CLINICAL DATA:  Intubated EXAM: PORTABLE CHEST 1 VIEW COMPARISON:  07/11/2023 FINDINGS: Endotracheal tube tip is about 4.2 cm superior to carina. Enteric tube tip below the diaphragm but incompletely assessed. Low lung volumes with mild basilar atelectasis. Normal cardiac size. No pneumothorax IMPRESSION: Endotracheal tube tip about 4.2 cm superior to carina. Low lung volumes with mild basilar atelectasis. Electronically Signed   By: Jasmine Pang M.D.   On: 10/27/2023 21:43    Assessment/Plan Glioblastoma, IDH-wildtype (HCC)  Seizure (HCC)  Alex Church remains clinically progressive today, with global decline in function.  He is sleeping often, and not performing activities of daily living independently.   We had an extensive goals of care discussion about plans moving forward.  Wife is very concerned about how she will take care of him at home.  She understands his brain tumor  remains incurable.  He is not a candidate for chemotherapy right now.  We recommended proceeding with hospice and comfort care, given poor prognosis, quality of life concerns.  She is agreeable with this.    Happy to remain on as primary hospice attending.  All questions were answered. The patient knows to call the clinic with any problems, questions or concerns. No barriers to learning were detected.  We ask that [name] return to clinic only as needed.  The total time spent in the encounter was 40 minutes and more than 50% was on counseling and review of test results   Henreitta Leber, MD Medical Director of Neuro-Oncology [facility] at [place] 11/23/23 11:53 AM

## 2023-11-23 NOTE — Telephone Encounter (Signed)
 PC to Alex Church with [facility], informed her of referral to hospice.  Patient is currently in [facility].  She states she will contact patient & his wife.

## 2023-11-24 ENCOUNTER — Ambulatory Visit: Payer: Medicare Other | Admitting: Internal Medicine

## 2023-11-24 ENCOUNTER — Other Ambulatory Visit: Payer: Medicare Other

## 2023-11-24 ENCOUNTER — Telehealth: Payer: Self-pay | Admitting: *Deleted

## 2023-11-24 NOTE — Telephone Encounter (Signed)
 Received PC from [facility] with [facility], was informed that patient's family prefers [facility] to provide his hospice care instead of [facility].  Haynes Bast RN at [facility] informed.

## 2023-11-25 ENCOUNTER — Other Ambulatory Visit: Payer: Self-pay

## 2023-11-25 ENCOUNTER — Telehealth: Payer: Self-pay | Admitting: Internal Medicine

## 2023-11-25 ENCOUNTER — Telehealth: Payer: Self-pay

## 2023-11-25 NOTE — Telephone Encounter (Signed)
 Patient called in and left a VM. I returned the calla and patient is all set.

## 2023-11-25 NOTE — Telephone Encounter (Signed)
 Alex Church

## 2023-11-25 NOTE — Progress Notes (Signed)
 Disenrolling- patient going on hospice care

## 2023-11-26 ENCOUNTER — Other Ambulatory Visit: Payer: Self-pay

## 2023-11-26 ENCOUNTER — Telehealth: Payer: Self-pay

## 2023-11-26 ENCOUNTER — Other Ambulatory Visit (HOSPITAL_COMMUNITY): Payer: Self-pay

## 2023-11-26 ENCOUNTER — Encounter: Payer: Self-pay | Admitting: Internal Medicine

## 2023-11-26 MED ORDER — LORAZEPAM 0.5 MG PO TABS
0.5000 mg | ORAL_TABLET | ORAL | 0 refills | Status: DC | PRN
  Filled 2023-11-26: qty 30, 5d supply, fill #0

## 2023-11-26 MED ORDER — VALPROIC ACID 250 MG/5ML PO SOLN
750.0000 mg | Freq: Three times a day (TID) | ORAL | 0 refills | Status: DC
  Filled 2023-11-26: qty 473, 11d supply, fill #0

## 2023-11-26 MED ORDER — MORPHINE SULFATE (CONCENTRATE) 10 MG /0.5 ML PO SOLN
5.0000 mg | ORAL | 0 refills | Status: DC | PRN
Start: 2023-11-26 — End: 2023-12-08
  Filled 2023-11-26: qty 30, 4d supply, fill #0

## 2023-11-26 MED ORDER — HYOSCYAMINE SULFATE 0.125 MG SL SUBL
0.1250 mg | SUBLINGUAL_TABLET | SUBLINGUAL | 0 refills | Status: DC | PRN
Start: 2023-11-26 — End: 2023-12-08
  Filled 2023-11-26: qty 30, 5d supply, fill #0

## 2023-11-26 NOTE — Transitions of Care (Post Inpatient/ED Visit) (Signed)
 11/26/2023  Name: Alex Church MRN: [id number] DOB: [birth year]  Today's TOC FU Call Status: Today's TOC FU Call Status:: Successful TOC FU Call Completed TOC FU Call Complete Date: 11/26/23 Patient's Name and Date of Birth confirmed.  Transition Care Management Follow-up Telephone Call Date of Discharge: 11/25/23 Discharge Facility: Other [facility]) Name of Other (Non[facility]) Discharge Facility: [facility] Type of Discharge: Inpatient Admission Primary Inpatient Discharge Diagnosis:: weakness How have you been since you were released from the hospital?: Same Any questions or concerns?: No  Items Reviewed: Did you receive and understand the discharge instructions provided?: Yes Medications obtained,verified, and reconciled?: Yes (Medications Reviewed) Any new allergies since your discharge?: No Dietary orders reviewed?: Yes Do you have support at home?: Yes People in Home: spouse  Medications Reviewed Today: Medications Reviewed Today     Reviewed by Karena Addison, LPN (Licensed Practical Nurse) on 11/26/23 at [phone]  Med List Status: <None>   Medication Order Taking? Sig Documenting Provider Last Dose Status Informant  acetaminophen (TYLENOL) 500 MG tablet [id number] No Take 1,000 mg by mouth every 6 (six) hours as needed for mild pain (pain score 1-3) (Tries to only take twice a week). [provider] Taking Active Spouse/Significant Other, Pharmacy Records  allopurinol (ZYLOPRIM) 100 MG tablet [id number] No Take 1 tablet (100 mg total) by mouth 2 (two) times daily. Wanda Plump, MD Taking Active Spouse/Significant Other, Pharmacy Records  atorvastatin (LIPITOR) 10 MG tablet [id number] No Take 1 tablet (10 mg total) by mouth at bedtime. Wanda Plump, MD Taking Active Spouse/Significant Other, Pharmacy Records  carvedilol (COREG) 12.5 MG tablet [id number] No Take 0.5 tablets (6.25 mg total) by mouth 2 (two) times daily with a meal. Wanda Plump, MD Taking Active  Spouse/Significant Other, Pharmacy Records  cloNIDine (CATAPRES) 0.1 MG tablet [id number] No Take 1 tablet (0.1 mg total) by mouth 2 (two) times daily. Wanda Plump, MD Taking Active Spouse/Significant Other, Pharmacy Records  dexamethasone (DECADRON) 4 MG tablet [id number] No Take 1 tablet (4 mg total) by mouth daily. Henreitta Leber, MD Taking Active   divalproex (DEPAKOTE ER) 500 MG 24 hr tablet [id number] No Take 3 tablets (1,500 mg total) by mouth daily for 1 day, THEN 2 tablets (1,000 mg total) in the morning and at bedtime. Henreitta Leber, MD Taking Active   glucose blood test strip [id number] No Check blood sugars once daily Wanda Plump, MD Taking Active Spouse/Significant Other, Pharmacy Records  Lancets [facility] ULTRASOFT) lancets [id number] No Check bloods sugars once daily Wanda Plump, MD Taking Active Spouse/Significant Other, Pharmacy Records  lisinopril (ZESTRIL) 20 MG tablet [id number] No Take 20 mg by mouth at bedtime. [provider] Taking Active Spouse/Significant Other, Pharmacy Records           Med Note (CRUTHIS, CHLOE C   Wed Oct 28, 2023  9:21 AM) Wife is adamant the pt is still taking this medication. Dispense report does not support this claim. Wife stated the pt takes 20 mg at bedtime.   melatonin 5 MG TABS [id number] No Take 1 tablet (5 mg total) by mouth at bedtime.  Patient taking differently: Take 5 mg by mouth at bedtime as needed (sleep).   Alba Cory, MD Taking Active Spouse/Significant Other, Pharmacy Records  metFORMIN (GLUCOPHAGE) 1000 MG tablet [id number] No Take 1 tablet (1,000 mg total) by mouth 2 (two) times daily with a meal.  Patient taking differently: Take 500 mg by mouth 2 (two) times daily with  a meal.   Wanda Plump, MD Taking Active Spouse/Significant Other, Pharmacy Records  ondansetron [facility]) 8 MG tablet [id number] No Take 1 tablet (8 mg total) by mouth every 8 (eight) hours as needed for nausea or vomiting. May take 30-60 minutes prior  to Temodar administration if nausea/vomiting occurs as needed. Henreitta Leber, MD Taking Active Spouse/Significant Other, Pharmacy Records           Med Note (CRUTHIS, CHLOE C   Wed Oct 28, 2023  9:22 AM) Wife is unsure of last dose.   pantoprazole (PROTONIX) 40 MG tablet [id number] No Take 1 tablet (40 mg total) by mouth daily. Henreitta Leber, MD Taking Active Spouse/Significant Other, Pharmacy Records  pioglitazone (ACTOS) 45 MG tablet [id number] No Take 1 tablet (45 mg total) by mouth daily. Wanda Plump, MD Taking Active Spouse/Significant Other, Pharmacy Records  QUEtiapine (SEROQUEL) 50 MG tablet [id number] No Take 1 tablet (50 mg total) by mouth at bedtime. Willeen Niece, MD Taking Active   senna (SENOKOT) 8.6 MG TABS tablet [id number] No Take 1 tablet by mouth daily as needed for mild constipation or moderate constipation. [provider] Taking Active Spouse/Significant Other, Pharmacy Records  sitaGLIPtin (JANUVIA) 100 MG tablet [id number] No Take 1 tablet (100 mg total) by mouth daily. Wanda Plump, MD Taking Active Spouse/Significant Other, Pharmacy Records  tamsulosin [facility]) 0.4 MG CAPS capsule [id number] No Take 1 capsule (0.4 mg total) by mouth daily after supper. Wanda Plump, MD Taking Active Spouse/Significant Other, Pharmacy Records  temozolomide [facility]) 100 MG capsule [id number] No Take 3 capsules (300 mg total) by mouth at bedtime. Take for 5 days on, then off for 23 days. Repeat every 28 days. Take on an empty stomach to decrease nausea & vomiting.  Patient not taking: Reported on 11/23/2023   Henreitta Leber, MD Not Taking Active Spouse/Significant Other, Pharmacy Records           Med Note (CRUTHIS, CHLOE C   Wed Oct 28, 2023  9:22 AM) Per wife the pt was due to start this medication back soon.   Med List Note Brunetta Jeans, [facility] 08/25/23 1011): Temodar filled through [facility] and Equipment/Supplies: Were Home  Health Services Ordered?: Yes Name of Home Health Agency:: hospice Has Agency set up a time to come to your home?: Yes First Home Health Visit Date: 11/25/23 Any new equipment or medical supplies ordered?: Yes Name of Medical supply agency?: hospice Were you able to get the equipment/medical supplies?: Yes Do you have any questions related to the use of the equipment/supplies?: No  Functional Questionnaire: Do you need assistance with bathing/showering or dressing?: Yes Do you need assistance with meal preparation?: Yes Do you need assistance with eating?: Yes Do you have difficulty maintaining continence: Yes Do you need assistance with getting out of bed/getting out of a chair/moving?: Yes Do you have difficulty managing or taking your medications?: Yes  Follow up appointments reviewed: PCP Follow-up appointment confirmed?: NA (Hospice) Specialist Hospital Follow-up appointment confirmed?: NA Do you need transportation to your follow-up appointment?: No Do you understand care options if your condition(s) worsen?: Yes-patient verbalized understanding    SIGNATURE Karena Addison, LPN [facility] Nurse Health Advisor Direct Dial [phone]

## 2023-11-27 ENCOUNTER — Other Ambulatory Visit: Payer: Self-pay

## 2023-11-27 ENCOUNTER — Encounter: Payer: Self-pay | Admitting: Internal Medicine

## 2023-11-27 ENCOUNTER — Other Ambulatory Visit (HOSPITAL_COMMUNITY): Payer: Self-pay

## 2023-11-27 MED ORDER — HALOPERIDOL LACTATE 2 MG/ML PO CONC
1.0000 mg | ORAL | 0 refills | Status: DC | PRN
  Filled 2023-11-27 (×2): qty 30, 10d supply, fill #0

## 2023-11-30 ENCOUNTER — Encounter: Payer: Self-pay | Admitting: Internal Medicine

## 2023-11-30 ENCOUNTER — Other Ambulatory Visit (HOSPITAL_COMMUNITY): Payer: Self-pay

## 2023-11-30 MED ORDER — LORAZEPAM 2 MG/ML PO CONC
1.0000 mg | ORAL | 0 refills | Status: DC | PRN
  Filled 2023-11-30: qty 30, 15d supply, fill #0

## 2023-11-30 MED ORDER — SENNA 8.8 MG/5ML PO LIQD
5.0000 mL | Freq: Every day | ORAL | 0 refills | Status: DC
  Filled 2023-11-30: qty 100, 15d supply, fill #0

## 2023-12-07 ENCOUNTER — Encounter: Payer: Self-pay | Admitting: Internal Medicine

## 2023-12-07 ENCOUNTER — Telehealth: Payer: Self-pay | Admitting: *Deleted

## 2023-12-07 NOTE — Telephone Encounter (Signed)
 Wife called to report that patient had passed away.  Pending death notice from [facility] to close the chart.

## 2023-12-08 DEATH — deceased

## 2024-01-20 ENCOUNTER — Encounter: Payer: Medicare Other | Admitting: Internal Medicine

## 2024-01-21 ENCOUNTER — Inpatient Hospital Stay: Admitting: Neurology

## 2024-03-03 ENCOUNTER — Ambulatory Visit: Payer: Medicare Other | Admitting: Internal Medicine
